# Patient Record
Sex: Male | Born: 1945
Health system: Southern US, Community
[De-identification: ages and names within clinical notes are randomized; demographics above are authoritative.]

## PROBLEM LIST (undated history)

## (undated) DIAGNOSIS — I10 Essential (primary) hypertension: Secondary | ICD-10-CM

## (undated) DIAGNOSIS — N189 Chronic kidney disease, unspecified: Secondary | ICD-10-CM

## (undated) DIAGNOSIS — H409 Unspecified glaucoma: Secondary | ICD-10-CM

## (undated) DIAGNOSIS — E039 Hypothyroidism, unspecified: Secondary | ICD-10-CM

## (undated) DIAGNOSIS — H269 Unspecified cataract: Secondary | ICD-10-CM

## (undated) DIAGNOSIS — G4730 Sleep apnea, unspecified: Secondary | ICD-10-CM

## (undated) DIAGNOSIS — R7303 Prediabetes: Secondary | ICD-10-CM

## (undated) DIAGNOSIS — T7840XA Allergy, unspecified, initial encounter: Secondary | ICD-10-CM

## (undated) DIAGNOSIS — M1990 Unspecified osteoarthritis, unspecified site: Secondary | ICD-10-CM

## (undated) DIAGNOSIS — E119 Type 2 diabetes mellitus without complications: Secondary | ICD-10-CM

## (undated) DIAGNOSIS — J45909 Unspecified asthma, uncomplicated: Secondary | ICD-10-CM

## (undated) DIAGNOSIS — Z973 Presence of spectacles and contact lenses: Secondary | ICD-10-CM

## (undated) DIAGNOSIS — K219 Gastro-esophageal reflux disease without esophagitis: Secondary | ICD-10-CM

## (undated) DIAGNOSIS — E785 Hyperlipidemia, unspecified: Secondary | ICD-10-CM

## (undated) DIAGNOSIS — D649 Anemia, unspecified: Secondary | ICD-10-CM

## (undated) DIAGNOSIS — J189 Pneumonia, unspecified organism: Secondary | ICD-10-CM

## (undated) HISTORY — DX: Essential (primary) hypertension: I10

## (undated) HISTORY — DX: Unspecified cataract: H26.9

## (undated) HISTORY — DX: Unspecified glaucoma: H40.9

## (undated) HISTORY — DX: Type 2 diabetes mellitus without complications: E11.9

## (undated) HISTORY — PX: JOINT REPLACEMENT: SHX530

## (undated) HISTORY — DX: Unspecified asthma, uncomplicated: J45.909

## (undated) HISTORY — DX: Hyperlipidemia, unspecified: E78.5

## (undated) HISTORY — DX: Gastro-esophageal reflux disease without esophagitis: K21.9

## (undated) HISTORY — PX: TONSILLECTOMY: SUR1361

## (undated) HISTORY — DX: Allergy, unspecified, initial encounter: T78.40XA

## (undated) HISTORY — PX: EYE SURGERY: SHX253

## (undated) HISTORY — PX: COLONOSCOPY: SHX174

## (undated) HISTORY — DX: Unspecified osteoarthritis, unspecified site: M19.90

## (undated) HISTORY — DX: Hypothyroidism, unspecified: E03.9

---

## 1972-11-28 HISTORY — PX: THYROIDECTOMY: SHX17

## 1999-01-08 ENCOUNTER — Ambulatory Visit: Admission: RE | Admit: 1999-01-08 | Discharge: 1999-01-08 | Payer: Self-pay | Admitting: Emergency Medicine

## 1999-11-10 ENCOUNTER — Other Ambulatory Visit: Admission: RE | Admit: 1999-11-10 | Discharge: 1999-11-10 | Payer: Self-pay | Admitting: Otolaryngology

## 1999-11-10 ENCOUNTER — Encounter (INDEPENDENT_AMBULATORY_CARE_PROVIDER_SITE_OTHER): Payer: Self-pay | Admitting: Specialist

## 2004-10-26 ENCOUNTER — Encounter: Admission: RE | Admit: 2004-10-26 | Discharge: 2004-10-26 | Payer: Self-pay | Admitting: Emergency Medicine

## 2004-11-28 HISTORY — PX: EYE SURGERY: SHX253

## 2004-12-15 ENCOUNTER — Encounter: Admission: RE | Admit: 2004-12-15 | Discharge: 2004-12-15 | Payer: Self-pay | Admitting: Emergency Medicine

## 2005-01-24 ENCOUNTER — Encounter: Admission: RE | Admit: 2005-01-24 | Discharge: 2005-01-24 | Payer: Self-pay | Admitting: Emergency Medicine

## 2005-01-26 ENCOUNTER — Encounter: Admission: RE | Admit: 2005-01-26 | Discharge: 2005-01-26 | Payer: Self-pay | Admitting: Emergency Medicine

## 2005-06-10 ENCOUNTER — Emergency Department (HOSPITAL_COMMUNITY): Admission: EM | Admit: 2005-06-10 | Discharge: 2005-06-10 | Payer: Self-pay | Admitting: Emergency Medicine

## 2006-06-19 ENCOUNTER — Encounter: Admission: RE | Admit: 2006-06-19 | Discharge: 2006-06-19 | Payer: Self-pay | Admitting: Emergency Medicine

## 2006-06-19 ENCOUNTER — Encounter (INDEPENDENT_AMBULATORY_CARE_PROVIDER_SITE_OTHER): Payer: Self-pay | Admitting: *Deleted

## 2006-06-19 IMAGING — RF DG ESOPHAGUS
13 of 16 series · 19 of 24 positions shown · non-contrast
Comparison: none

CLINICAL DATA: Dysphagia. 
 BARIUM SWALLOW:

[Series 1: run · 1 of 1 slices shown (1 of 13)]
[im 1/1]
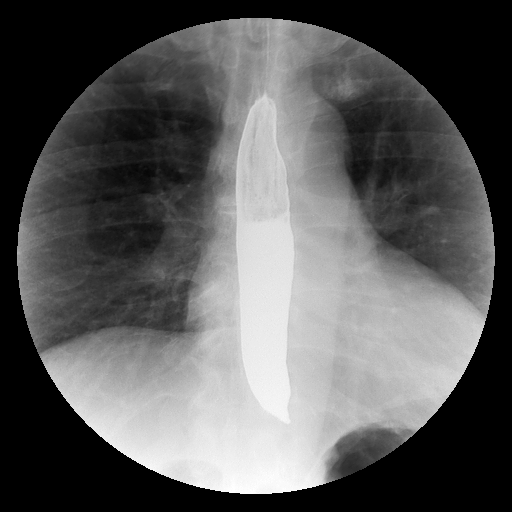

[Series 3: run · 1 of 1 slices shown (2 of 13)]
[im 1/1]
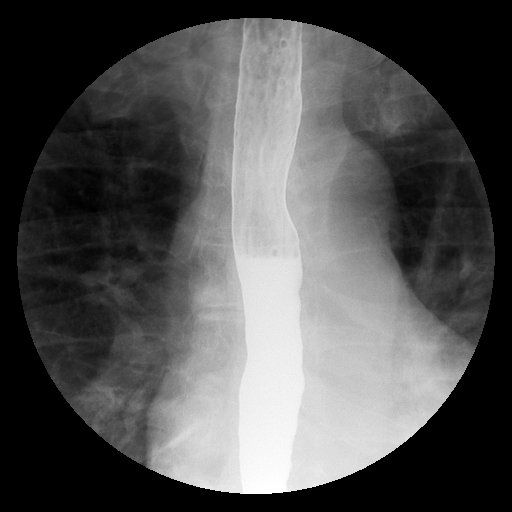

[Series 5: run · 1 of 1 slices shown (3 of 13)]
[im 1/1]
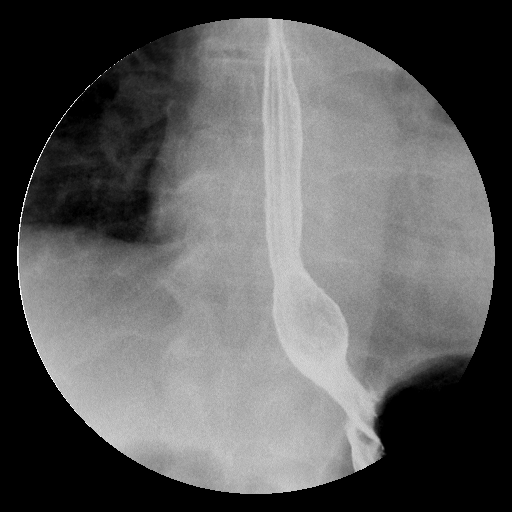

[Series 6: run · 1 of 1 slices shown (4 of 13)]
[im 1/1]
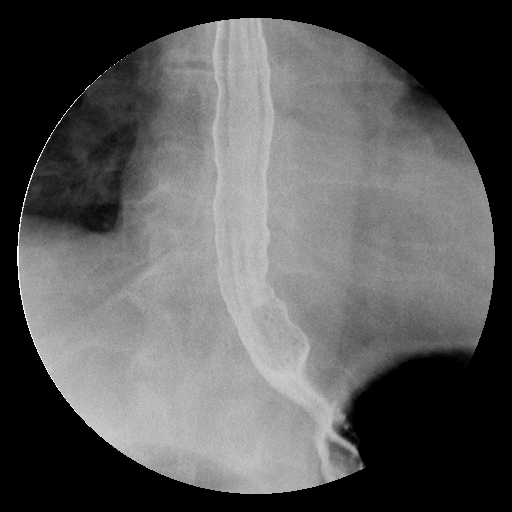

[Series 7: run · 5 of 11 slices shown (5 of 13)]
[im 1/11]
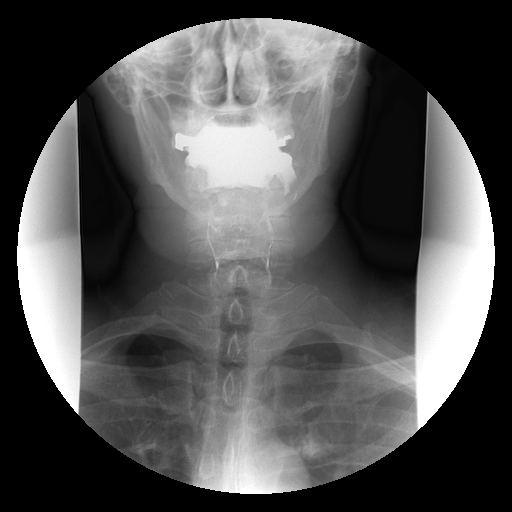
[im 3/11]
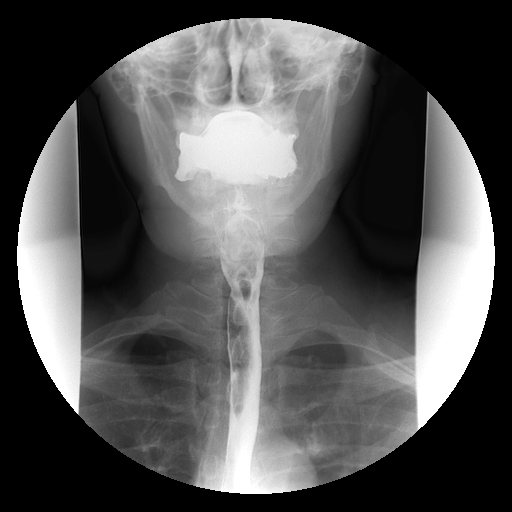
[im 7/11]
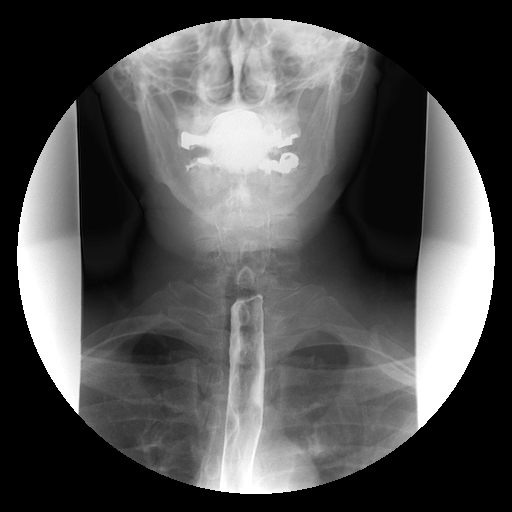
[im 9/11]
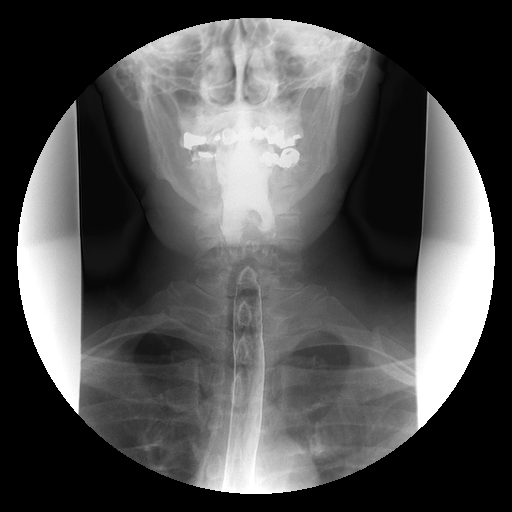
[im 11/11]
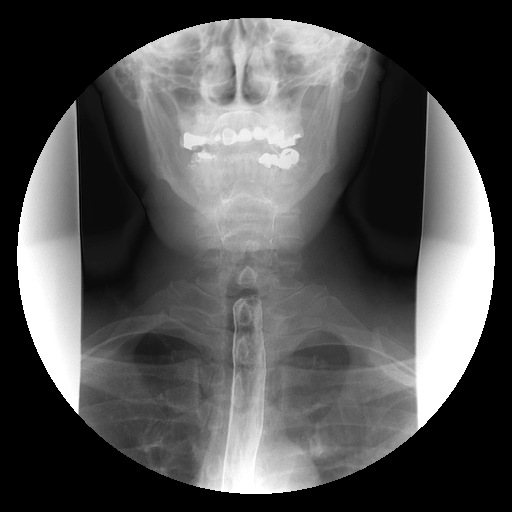

[Series 8: run · 3 of 7 slices shown (6 of 13)]
[im 3/7]
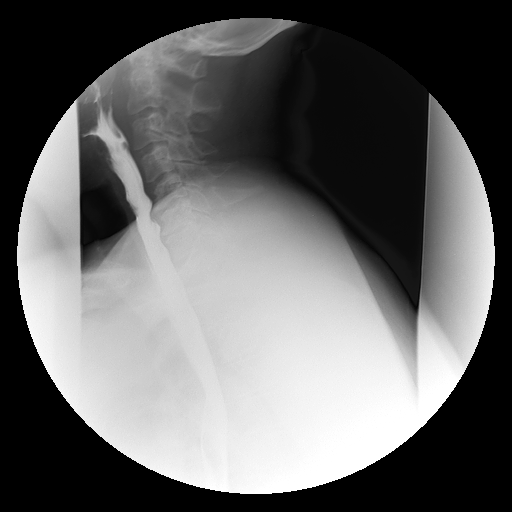
[im 5/7]
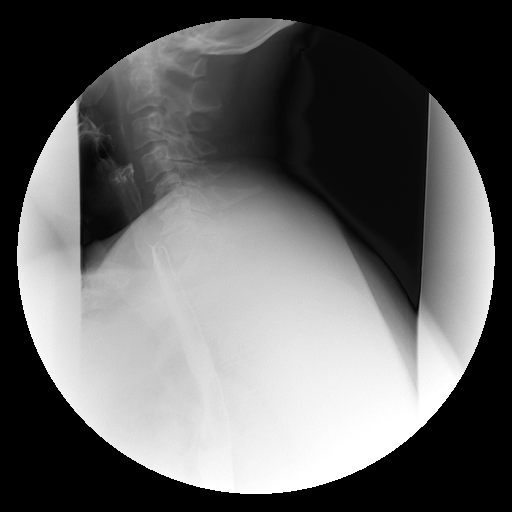
[im 7/7]
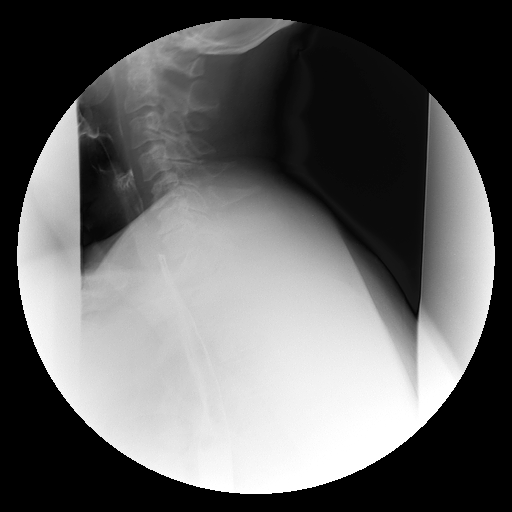

[Series 9: run · 1 of 1 slices shown (7 of 13)]
[im 1/1]
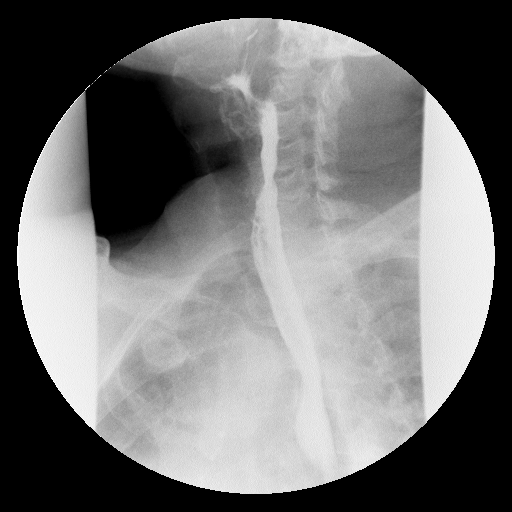

[Series 12: run · 1 of 1 slices shown (8 of 13)]
[im 1/1]
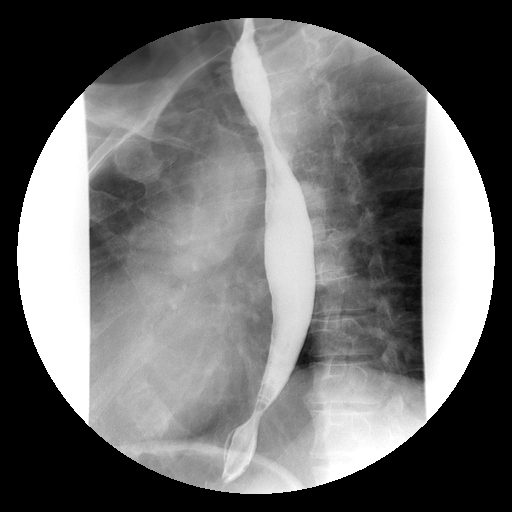

[Series 13: run · 1 of 1 slices shown (9 of 13)]
[im 1/1]
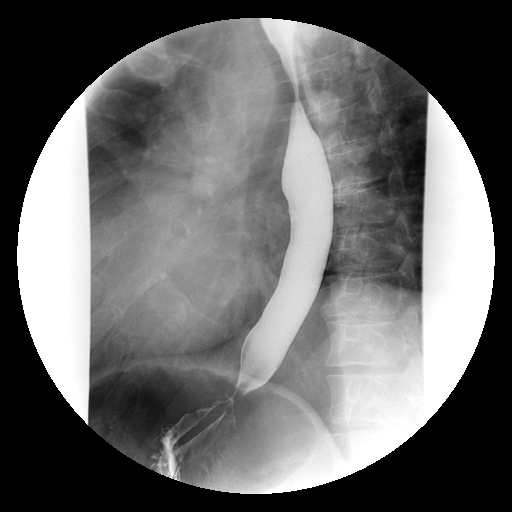

[Series 14: run · 1 of 1 slices shown (10 of 13)]
[im 1/1]
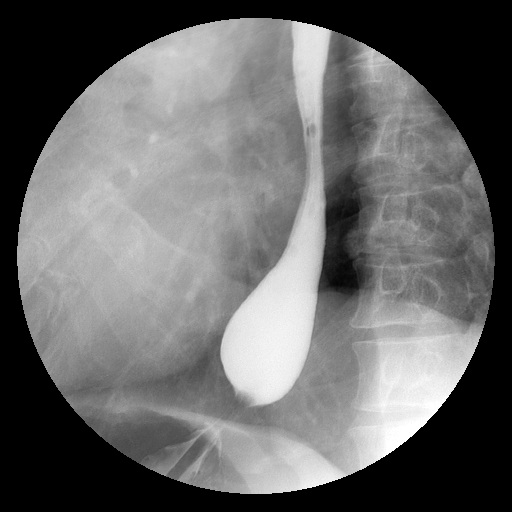

[Series 15: run · 1 of 1 slices shown (11 of 13)]
[im 1/1]
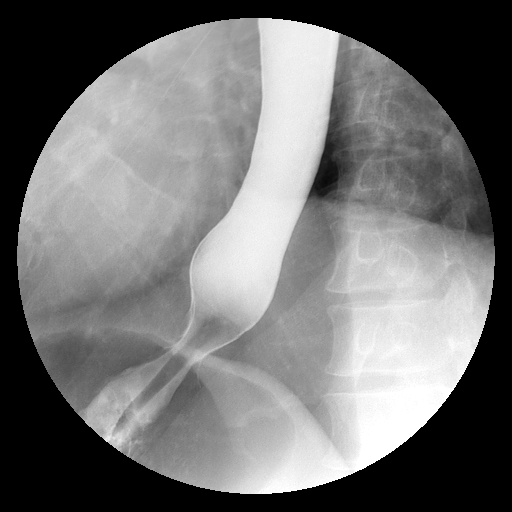

[Series 17: run · 1 of 1 slices shown (12 of 13)]
[im 1/1]
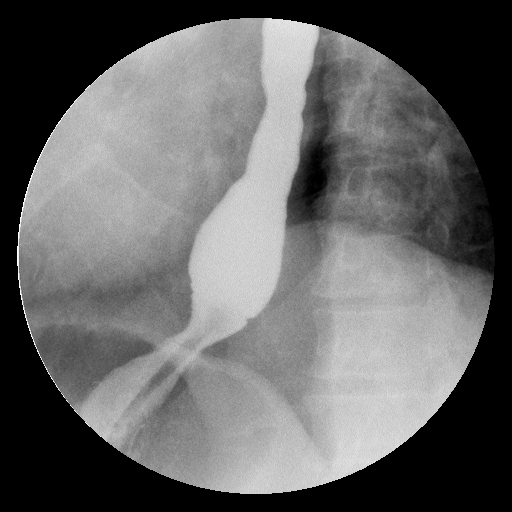

[Series 18: run · 1 of 1 slices shown (13 of 13)]
[im 1/1]
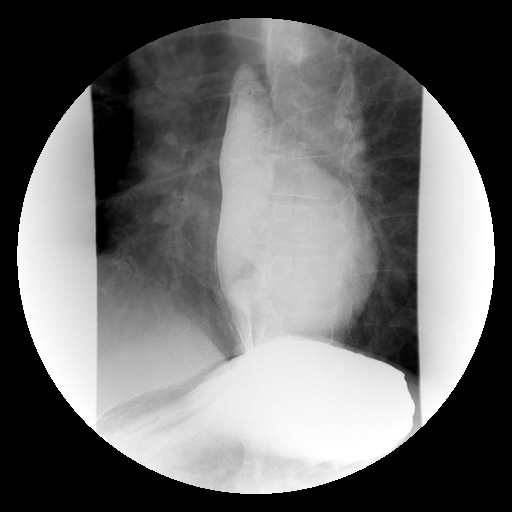

[19 of 24 positions shown; findings below may reference images not displayed]

FINDINGS: A double contrast barium swallow was performed.  The mucosa of the esophagus appears normal.  This single contrast study was performed with rapid sequence spot films of the cervical esophagus.  The swallowing mechanism appears normal with only slight indentation by osteophytes at C5-6 and the C6-7 levels. Peristalsis is normal.  There is a small sliding hiatal hernia and faint gastroesophageal reflux is noted fluoroscopically.  A barium pill was given at the end of the study which passed into the stomach without delay.
IMPRESSION: 1.  Small sliding hiatal hernia with faint reflux.  A barium pill passes into the stomach without delay.
 2.  Mild indentation on posterior cervical esophagus by osteophytes at C5-6 and C6-7.

## 2007-02-12 ENCOUNTER — Ambulatory Visit (HOSPITAL_COMMUNITY): Admission: RE | Admit: 2007-02-12 | Discharge: 2007-02-12 | Payer: Self-pay | Admitting: Gastroenterology

## 2007-02-12 ENCOUNTER — Encounter (INDEPENDENT_AMBULATORY_CARE_PROVIDER_SITE_OTHER): Payer: Self-pay | Admitting: Specialist

## 2007-06-29 HISTORY — PX: NM MYOVIEW LTD: HXRAD82

## 2007-07-11 LAB — HM COLONOSCOPY: HM Colonoscopy: NORMAL

## 2007-10-19 ENCOUNTER — Inpatient Hospital Stay (HOSPITAL_COMMUNITY): Admission: RE | Admit: 2007-10-19 | Discharge: 2007-10-24 | Payer: Self-pay | Admitting: Orthopedic Surgery

## 2007-10-26 ENCOUNTER — Emergency Department (HOSPITAL_COMMUNITY): Admission: EM | Admit: 2007-10-26 | Discharge: 2007-10-26 | Payer: Self-pay | Admitting: Emergency Medicine

## 2007-11-29 HISTORY — PX: TOTAL KNEE ARTHROPLASTY: SHX125

## 2009-05-28 ENCOUNTER — Encounter (INDEPENDENT_AMBULATORY_CARE_PROVIDER_SITE_OTHER): Payer: Self-pay | Admitting: *Deleted

## 2009-05-28 ENCOUNTER — Ambulatory Visit: Payer: Self-pay | Admitting: Internal Medicine

## 2009-05-28 DIAGNOSIS — K219 Gastro-esophageal reflux disease without esophagitis: Secondary | ICD-10-CM | POA: Insufficient documentation

## 2009-05-28 DIAGNOSIS — M1711 Unilateral primary osteoarthritis, right knee: Secondary | ICD-10-CM | POA: Insufficient documentation

## 2009-05-28 DIAGNOSIS — I10 Essential (primary) hypertension: Secondary | ICD-10-CM | POA: Insufficient documentation

## 2009-05-28 DIAGNOSIS — E039 Hypothyroidism, unspecified: Secondary | ICD-10-CM | POA: Insufficient documentation

## 2009-05-28 DIAGNOSIS — R351 Nocturia: Secondary | ICD-10-CM

## 2009-05-28 DIAGNOSIS — E785 Hyperlipidemia, unspecified: Secondary | ICD-10-CM | POA: Insufficient documentation

## 2009-05-28 DIAGNOSIS — R9431 Abnormal electrocardiogram [ECG] [EKG]: Secondary | ICD-10-CM | POA: Insufficient documentation

## 2009-05-28 DIAGNOSIS — E1169 Type 2 diabetes mellitus with other specified complication: Secondary | ICD-10-CM | POA: Insufficient documentation

## 2009-05-28 DIAGNOSIS — N401 Enlarged prostate with lower urinary tract symptoms: Secondary | ICD-10-CM | POA: Insufficient documentation

## 2009-05-29 ENCOUNTER — Encounter: Payer: Self-pay | Admitting: Internal Medicine

## 2009-06-18 ENCOUNTER — Telehealth: Payer: Self-pay | Admitting: Gastroenterology

## 2009-06-29 ENCOUNTER — Ambulatory Visit: Payer: Self-pay | Admitting: Internal Medicine

## 2009-06-29 DIAGNOSIS — F528 Other sexual dysfunction not due to a substance or known physiological condition: Secondary | ICD-10-CM | POA: Insufficient documentation

## 2009-07-14 ENCOUNTER — Encounter: Payer: Self-pay | Admitting: Internal Medicine

## 2009-07-24 ENCOUNTER — Encounter: Payer: Self-pay | Admitting: Internal Medicine

## 2009-08-06 ENCOUNTER — Encounter: Payer: Self-pay | Admitting: Internal Medicine

## 2009-08-19 ENCOUNTER — Ambulatory Visit: Payer: Self-pay | Admitting: Internal Medicine

## 2009-08-19 LAB — CONVERTED CEMR LAB
Bilirubin Urine: NEGATIVE
Chlamydia, Swab/Urine, PCR: NEGATIVE
GC Probe Amp, Urine: NEGATIVE
Ketones, ur: NEGATIVE mg/dL
Leukocytes, UA: NEGATIVE
Nitrite: NEGATIVE
Specific Gravity, Urine: 1.015 (ref 1.000–1.030)
Total Protein, Urine: NEGATIVE mg/dL
Urine Glucose: NEGATIVE mg/dL
Urobilinogen, UA: 0.2 (ref 0.0–1.0)
pH: 6.5 (ref 5.0–8.0)

## 2009-10-14 ENCOUNTER — Ambulatory Visit: Payer: Self-pay | Admitting: Internal Medicine

## 2009-10-14 DIAGNOSIS — L219 Seborrheic dermatitis, unspecified: Secondary | ICD-10-CM | POA: Insufficient documentation

## 2009-10-14 LAB — CONVERTED CEMR LAB
ALT: 17 units/L (ref 0–53)
AST: 19 units/L (ref 0–37)
Albumin: 4.3 g/dL (ref 3.5–5.2)
Alkaline Phosphatase: 78 units/L (ref 39–117)
BUN: 11 mg/dL (ref 6–23)
Basophils Absolute: 0 10*3/uL (ref 0.0–0.1)
Basophils Relative: 0.4 % (ref 0.0–3.0)
Bilirubin Urine: NEGATIVE
Bilirubin, Direct: 0.1 mg/dL (ref 0.0–0.3)
CO2: 29 meq/L (ref 19–32)
Calcium: 9.3 mg/dL (ref 8.4–10.5)
Chloride: 105 meq/L (ref 96–112)
Creatinine, Ser: 1.2 mg/dL (ref 0.4–1.5)
Eosinophils Absolute: 0.4 10*3/uL (ref 0.0–0.7)
Eosinophils Relative: 5.3 % — ABNORMAL HIGH (ref 0.0–5.0)
GFR calc non Af Amer: 78.42 mL/min (ref >60)
Glucose, Bld: 99 mg/dL (ref 70–99)
HCT: 40.5 % (ref 39.0–52.0)
Hemoglobin, Urine: NEGATIVE
Hemoglobin: 13.4 g/dL (ref 13.0–17.0)
Ketones, ur: NEGATIVE mg/dL
Leukocytes, UA: NEGATIVE
Lipase: 21 units/L (ref 11.0–59.0)
Lymphocytes Relative: 16.8 % (ref 12.0–46.0)
Lymphs Abs: 1.2 10*3/uL (ref 0.7–4.0)
MCHC: 33.1 g/dL (ref 30.0–36.0)
MCV: 92.9 fL (ref 78.0–100.0)
Monocytes Absolute: 0.6 10*3/uL (ref 0.1–1.0)
Monocytes Relative: 8.6 % (ref 3.0–12.0)
Neutro Abs: 5.1 10*3/uL (ref 1.4–7.7)
Neutrophils Relative %: 68.9 % (ref 43.0–77.0)
Nitrite: NEGATIVE
Platelets: 213 10*3/uL (ref 150.0–400.0)
Potassium: 3.6 meq/L (ref 3.5–5.1)
RBC: 4.36 M/uL (ref 4.22–5.81)
RDW: 13.4 % (ref 11.5–14.6)
Sodium: 141 meq/L (ref 135–145)
Specific Gravity, Urine: 1.01 (ref 1.000–1.030)
TSH: 0.61 microintl units/mL (ref 0.35–5.50)
Total Bilirubin: 0.8 mg/dL (ref 0.3–1.2)
Total Protein, Urine: NEGATIVE mg/dL
Total Protein: 7.8 g/dL (ref 6.0–8.3)
Urine Glucose: NEGATIVE mg/dL
Urobilinogen, UA: 0.2 (ref 0.0–1.0)
WBC: 7.3 10*3/uL (ref 4.5–10.5)
pH: 7 (ref 5.0–8.0)

## 2009-10-19 ENCOUNTER — Ambulatory Visit: Payer: Self-pay | Admitting: Cardiovascular Disease

## 2009-10-28 ENCOUNTER — Telehealth (INDEPENDENT_AMBULATORY_CARE_PROVIDER_SITE_OTHER): Payer: Self-pay | Admitting: *Deleted

## 2009-11-10 ENCOUNTER — Ambulatory Visit: Payer: Self-pay | Admitting: Internal Medicine

## 2009-11-10 LAB — CONVERTED CEMR LAB
Bilirubin Urine: NEGATIVE
Glucose, Urine, Semiquant: NEGATIVE
Ketones, urine, test strip: NEGATIVE
Nitrite: NEGATIVE
Protein, U semiquant: NEGATIVE
Specific Gravity, Urine: 1.01
Urobilinogen, UA: 0.2
pH: 6

## 2009-12-10 ENCOUNTER — Ambulatory Visit: Payer: Self-pay | Admitting: Internal Medicine

## 2009-12-10 LAB — CONVERTED CEMR LAB
Bilirubin Urine: NEGATIVE
Blood in Urine, dipstick: NEGATIVE
Glucose, Urine, Semiquant: NEGATIVE
Ketones, urine, test strip: NEGATIVE
Nitrite: NEGATIVE
Specific Gravity, Urine: 1.02
Urobilinogen, UA: 0.2
WBC Urine, dipstick: NEGATIVE
pH: 6.5

## 2010-01-15 ENCOUNTER — Telehealth: Payer: Self-pay | Admitting: Internal Medicine

## 2010-04-14 ENCOUNTER — Ambulatory Visit: Payer: Self-pay | Admitting: Internal Medicine

## 2010-04-28 ENCOUNTER — Ambulatory Visit: Payer: Self-pay | Admitting: Internal Medicine

## 2010-04-30 ENCOUNTER — Ambulatory Visit: Payer: Self-pay | Admitting: Internal Medicine

## 2010-04-30 LAB — CONVERTED CEMR LAB
Cholesterol, target level: 200 mg/dL
HDL goal, serum: 40 mg/dL
LDL Goal: 130 mg/dL

## 2010-05-12 ENCOUNTER — Telehealth: Payer: Self-pay | Admitting: Internal Medicine

## 2010-05-13 ENCOUNTER — Ambulatory Visit: Payer: Self-pay | Admitting: Internal Medicine

## 2010-07-27 ENCOUNTER — Encounter: Payer: Self-pay | Admitting: Internal Medicine

## 2010-07-28 ENCOUNTER — Telehealth: Payer: Self-pay | Admitting: Internal Medicine

## 2010-09-20 ENCOUNTER — Telehealth: Payer: Self-pay | Admitting: Internal Medicine

## 2010-09-30 ENCOUNTER — Encounter: Payer: Self-pay | Admitting: Internal Medicine

## 2010-09-30 ENCOUNTER — Ambulatory Visit: Payer: Self-pay | Admitting: Internal Medicine

## 2010-09-30 DIAGNOSIS — R079 Chest pain, unspecified: Secondary | ICD-10-CM | POA: Insufficient documentation

## 2010-09-30 LAB — CONVERTED CEMR LAB
ALT: 14 units/L (ref 0–53)
AST: 17 units/L (ref 0–37)
Albumin: 4 g/dL (ref 3.5–5.2)
Alkaline Phosphatase: 60 units/L (ref 39–117)
BUN: 16 mg/dL (ref 6–23)
Basophils Absolute: 0 10*3/uL (ref 0.0–0.1)
Basophils Relative: 0.4 % (ref 0.0–3.0)
Bilirubin, Direct: 0.1 mg/dL (ref 0.0–0.3)
CK-MB: 2.1 ng/mL (ref 0.3–4.0)
CO2: 31 meq/L (ref 19–32)
Calcium: 9.4 mg/dL (ref 8.4–10.5)
Chloride: 102 meq/L (ref 96–112)
Creatinine, Ser: 1.3 mg/dL (ref 0.4–1.5)
Eosinophils Absolute: 0.3 10*3/uL (ref 0.0–0.7)
Eosinophils Relative: 3.9 % (ref 0.0–5.0)
GFR calc non Af Amer: 70.04 mL/min (ref >60)
Glucose, Bld: 95 mg/dL (ref 70–99)
HCT: 39.2 % (ref 39.0–52.0)
Hemoglobin: 13.3 g/dL (ref 13.0–17.0)
Lymphocytes Relative: 14.6 % (ref 12.0–46.0)
Lymphs Abs: 1.3 10*3/uL (ref 0.7–4.0)
MCHC: 33.8 g/dL (ref 30.0–36.0)
MCV: 92.3 fL (ref 78.0–100.0)
Monocytes Absolute: 0.8 10*3/uL (ref 0.1–1.0)
Monocytes Relative: 9.2 % (ref 3.0–12.0)
Neutro Abs: 6.3 10*3/uL (ref 1.4–7.7)
Neutrophils Relative %: 71.9 % (ref 43.0–77.0)
Platelets: 265 10*3/uL (ref 150.0–400.0)
Potassium: 4 meq/L (ref 3.5–5.1)
RBC: 4.25 M/uL (ref 4.22–5.81)
RDW: 14.2 % (ref 11.5–14.6)
Relative Index: 0.9 (ref 0.0–2.5)
Sodium: 140 meq/L (ref 135–145)
TSH: 2.47 microintl units/mL (ref 0.35–5.50)
Total Bilirubin: 0.7 mg/dL (ref 0.3–1.2)
Total CK: 233 units/L — ABNORMAL HIGH (ref 7–232)
Total Protein: 7.4 g/dL (ref 6.0–8.3)
WBC: 8.7 10*3/uL (ref 4.5–10.5)

## 2010-10-01 ENCOUNTER — Ambulatory Visit: Payer: Self-pay | Admitting: Internal Medicine

## 2010-10-01 DIAGNOSIS — I498 Other specified cardiac arrhythmias: Secondary | ICD-10-CM | POA: Insufficient documentation

## 2010-10-12 ENCOUNTER — Telehealth (INDEPENDENT_AMBULATORY_CARE_PROVIDER_SITE_OTHER): Payer: Self-pay | Admitting: *Deleted

## 2010-10-13 ENCOUNTER — Encounter: Payer: Self-pay | Admitting: Cardiovascular Disease

## 2010-10-13 ENCOUNTER — Encounter (HOSPITAL_COMMUNITY)
Admission: RE | Admit: 2010-10-13 | Discharge: 2010-11-27 | Payer: Self-pay | Source: Home / Self Care | Attending: Internal Medicine | Admitting: Internal Medicine

## 2010-10-13 ENCOUNTER — Ambulatory Visit: Payer: Self-pay | Admitting: Cardiovascular Disease

## 2010-10-13 ENCOUNTER — Ambulatory Visit: Payer: Self-pay

## 2010-10-18 ENCOUNTER — Telehealth: Payer: Self-pay | Admitting: Internal Medicine

## 2010-10-18 DIAGNOSIS — I517 Cardiomegaly: Secondary | ICD-10-CM | POA: Insufficient documentation

## 2010-10-19 ENCOUNTER — Ambulatory Visit: Payer: Self-pay | Admitting: Cardiology

## 2010-10-19 ENCOUNTER — Ambulatory Visit (HOSPITAL_COMMUNITY): Admission: RE | Admit: 2010-10-19 | Discharge: 2010-10-19 | Payer: Self-pay | Admitting: Internal Medicine

## 2010-10-19 ENCOUNTER — Encounter: Payer: Self-pay | Admitting: Internal Medicine

## 2010-10-19 ENCOUNTER — Ambulatory Visit: Payer: Self-pay

## 2010-10-26 ENCOUNTER — Telehealth: Payer: Self-pay | Admitting: Internal Medicine

## 2010-11-19 ENCOUNTER — Ambulatory Visit: Payer: Self-pay | Admitting: Internal Medicine

## 2010-11-19 DIAGNOSIS — J019 Acute sinusitis, unspecified: Secondary | ICD-10-CM | POA: Insufficient documentation

## 2010-12-19 ENCOUNTER — Encounter: Payer: Self-pay | Admitting: Emergency Medicine

## 2010-12-26 LAB — CONVERTED CEMR LAB
ALT: 17 units/L (ref 0–53)
ALT: 19 units/L (ref 0–53)
AST: 20 units/L (ref 0–37)
AST: 21 units/L (ref 0–37)
Albumin: 4.1 g/dL (ref 3.5–5.2)
Albumin: 4.3 g/dL (ref 3.5–5.2)
Alkaline Phosphatase: 77 units/L (ref 39–117)
Alkaline Phosphatase: 81 units/L (ref 39–117)
Anti Nuclear Antibody(ANA): NEGATIVE
BUN: 12 mg/dL (ref 6–23)
BUN: 14 mg/dL (ref 6–23)
Basophils Absolute: 0 10*3/uL (ref 0.0–0.1)
Basophils Absolute: 0 10*3/uL (ref 0.0–0.1)
Basophils Relative: 0.3 % (ref 0.0–3.0)
Basophils Relative: 0.5 % (ref 0.0–3.0)
Bilirubin Urine: NEGATIVE
Bilirubin Urine: NEGATIVE
Bilirubin, Direct: 0.1 mg/dL (ref 0.0–0.3)
Bilirubin, Direct: 0.1 mg/dL (ref 0.0–0.3)
CO2: 31 meq/L (ref 19–32)
CO2: 32 meq/L (ref 19–32)
CRP, High Sensitivity: 3.63 (ref 0.00–5.00)
Calcium: 9.4 mg/dL (ref 8.4–10.5)
Calcium: 9.8 mg/dL (ref 8.4–10.5)
Chloride: 100 meq/L (ref 96–112)
Chloride: 102 meq/L (ref 96–112)
Cholesterol: 231 mg/dL — ABNORMAL HIGH (ref 0–200)
Creatinine, Ser: 1.2 mg/dL (ref 0.4–1.5)
Creatinine, Ser: 1.2 mg/dL (ref 0.4–1.5)
Direct LDL: 139.2 mg/dL
Eosinophils Absolute: 0.1 10*3/uL (ref 0.0–0.7)
Eosinophils Absolute: 0.3 10*3/uL (ref 0.0–0.7)
Eosinophils Relative: 1.6 % (ref 0.0–5.0)
Eosinophils Relative: 3.5 % (ref 0.0–5.0)
GFR calc non Af Amer: 78.52 mL/min (ref >60)
GFR calc non Af Amer: 81.42 mL/min (ref >60)
Glucose, Bld: 102 mg/dL — ABNORMAL HIGH (ref 70–99)
Glucose, Bld: 94 mg/dL (ref 70–99)
HCT: 41.6 % (ref 39.0–52.0)
HCT: 41.6 % (ref 39.0–52.0)
HDL: 62.2 mg/dL (ref >39.00)
Hemoglobin, Urine: NEGATIVE
Hemoglobin, Urine: NEGATIVE
Hemoglobin: 14.1 g/dL (ref 13.0–17.0)
Hemoglobin: 14.2 g/dL (ref 13.0–17.0)
Ketones, ur: NEGATIVE mg/dL
Ketones, ur: NEGATIVE mg/dL
Leukocytes, UA: NEGATIVE
Leukocytes, UA: NEGATIVE
Lymphocytes Relative: 14.2 % (ref 12.0–46.0)
Lymphocytes Relative: 17.6 % (ref 12.0–46.0)
Lymphs Abs: 1.4 10*3/uL (ref 0.7–4.0)
Lymphs Abs: 1.4 10*3/uL (ref 0.7–4.0)
MCHC: 34 g/dL (ref 30.0–36.0)
MCHC: 34.2 g/dL (ref 30.0–36.0)
MCV: 90.9 fL (ref 78.0–100.0)
MCV: 91.2 fL (ref 78.0–100.0)
Monocytes Absolute: 0.7 10*3/uL (ref 0.1–1.0)
Monocytes Absolute: 0.7 10*3/uL (ref 0.1–1.0)
Monocytes Relative: 6.9 % (ref 3.0–12.0)
Monocytes Relative: 8.7 % (ref 3.0–12.0)
Neutro Abs: 5.6 10*3/uL (ref 1.4–7.7)
Neutro Abs: 7.4 10*3/uL (ref 1.4–7.7)
Neutrophils Relative %: 69.9 % (ref 43.0–77.0)
Neutrophils Relative %: 76.8 % (ref 43.0–77.0)
Nitrite: NEGATIVE
Nitrite: NEGATIVE
PSA: 1.71 ng/mL (ref 0.10–4.00)
Platelets: 243 10*3/uL (ref 150.0–400.0)
Platelets: 264 10*3/uL (ref 150.0–400.0)
Potassium: 3.8 meq/L (ref 3.5–5.1)
Potassium: 3.9 meq/L (ref 3.5–5.1)
RBC: 4.56 M/uL (ref 4.22–5.81)
RBC: 4.58 M/uL (ref 4.22–5.81)
RDW: 12.8 % (ref 11.5–14.6)
RDW: 14.4 % (ref 11.5–14.6)
Rheumatoid fact SerPl-aCnc: 20 intl units/mL (ref 0.0–20.0)
Sed Rate: 11 mm/hr (ref 0–22)
Sodium: 140 meq/L (ref 135–145)
Sodium: 141 meq/L (ref 135–145)
Specific Gravity, Urine: 1.02 (ref 1.000–1.030)
Specific Gravity, Urine: 1.02 (ref 1.000–1.030)
TSH: 0.98 microintl units/mL (ref 0.35–5.50)
TSH: 1.36 microintl units/mL (ref 0.35–5.50)
Total Bilirubin: 0.5 mg/dL (ref 0.3–1.2)
Total Bilirubin: 1 mg/dL (ref 0.3–1.2)
Total CHOL/HDL Ratio: 4
Total CK: 392 units/L — ABNORMAL HIGH (ref 7–232)
Total Protein, Urine: 30 mg/dL
Total Protein, Urine: 30 mg/dL
Total Protein: 7.9 g/dL (ref 6.0–8.3)
Total Protein: 7.9 g/dL (ref 6.0–8.3)
Triglycerides: 85 mg/dL (ref 0.0–149.0)
Urine Glucose: NEGATIVE mg/dL
Urine Glucose: NEGATIVE mg/dL
Urobilinogen, UA: 0.2 (ref 0.0–1.0)
Urobilinogen, UA: 0.2 (ref 0.0–1.0)
VLDL: 17 mg/dL (ref 0.0–40.0)
WBC: 8 10*3/uL (ref 4.5–10.5)
WBC: 9.6 10*3/uL (ref 4.5–10.5)
pH: 7 (ref 5.0–8.0)
pH: 7 (ref 5.0–8.0)

## 2010-12-30 NOTE — Assessment & Plan Note (Signed)
Summary: h/a and elevated bp / SD   Vital Signs:  Patient profile:   65 year old male Height:      69 inches Weight:      207 pounds BMI:     30.68 O2 Sat:      97 % on Room air Temp:     98.9 degrees F oral Pulse rate:   55 / minute Pulse rhythm:   regular Resp:     16 per minute BP sitting:   148 / 82  (left arm) Cuff size:   large  Vitals Entered By: Rock Nephew CMA (May 13, 2010 3:48 PM)  Nutrition Counseling: Patient's BMI is greater than 25 and therefore counseled on weight management options.  O2 Flow:  Room air CC: headache with elevated bp Is Patient Diabetic? No Pain Assessment Patient in pain? no        Primary Care Provider:  [name] MD  CC:  headache with elevated bp.  History of Present Illness:  Hypertension Follow-Up      This is a 65 year old man who presents for Hypertension follow-up.  The patient reports headaches, but denies lightheadedness, urinary frequency, edema, impotence, rash, and fatigue.  The patient denies the following associated symptoms: chest pain, chest pressure, exercise intolerance, dyspnea, palpitations, syncope, leg edema, and pedal edema.  Compliance with medications (by patient report) has been near 100%.  The patient reports that dietary compliance has been good.  The patient reports exercising 3-4X per week.  Adjunctive measures currently used by the patient include salt restriction and relaxation.  He says BP at home is usually close to 180/90 with a cuff that has been calibrated and he says is "close to normal" but may be a little high.  Preventive Screening-Counseling & Management  Alcohol-Tobacco     Alcohol drinks/day: 0     Smoking Status: never  Hep-HIV-STD-Contraception     Hepatitis Risk: no risk noted     HIV Risk: no risk noted     STD Risk: no risk noted      Sexual History:  currently monogamous.        Drug Use:  no.        Blood Transfusions:  no.    Clinical Review Panels:  Prevention  Last Colonoscopy:  Normal (07/11/2007)   Last PSA:  1.71 (05/28/2009)  Immunizations   Last Tetanus Booster:  Historical (11/28/2004)   Last Flu Vaccine:  Fluvax MCR (08/19/2009)   Last Pneumovax:  Historical (11/28/2004)  Lipid Management   Cholesterol:  231 (05/28/2009)   HDL (good cholesterol):  62.20 (05/28/2009)  Diabetes Management   Creatinine:  1.2 (04/14/2010)   Last Flu Vaccine:  Fluvax MCR (08/19/2009)   Last Pneumovax:  Historical (11/28/2004)  CBC   WBC:  9.6 (04/14/2010)   RBC:  4.58 (04/14/2010)   Hgb:  14.1 (04/14/2010)   Hct:  41.6 (04/14/2010)   Platelets:  264.0 (04/14/2010)   MCV  90.9 (04/14/2010)   MCHC  34.0 (04/14/2010)   RDW  14.4 (04/14/2010)   PMN:  76.8 (04/14/2010)   Lymphs:  14.2 (04/14/2010)   Monos:  6.9 (04/14/2010)   Eosinophils:  1.6 (04/14/2010)   Basophil:  0.5 (04/14/2010)  Complete Metabolic Panel   Glucose:  94 (04/14/2010)   Sodium:  141 (04/14/2010)   Potassium:  3.9 (04/14/2010)   Chloride:  100 (04/14/2010)   CO2:  31 (04/14/2010)   BUN:  14 (04/14/2010)   Creatinine:  1.2 (  04/14/2010)   Albumin:  4.3 (04/14/2010)   Total Protein:  7.9 (04/14/2010)   Calcium:  9.8 (04/14/2010)   Total Bili:  0.5 (04/14/2010)   Alk Phos:  81 (04/14/2010)   SGPT (ALT):  19 (04/14/2010)   SGOT (AST):  21 (04/14/2010)   Medications Prior to Update: 1)  Levothyroxine Sodium 175 Mcg Tabs (Levothyroxine Sodium) .... Take 1 Tablet By Mouth Once A Day 2)  Aspirin 81 Mg Tbec (Aspirin) 3)  Pravastatin Sodium 40 Mg Tabs (Pravastatin Sodium) .... Take 1 Tab By Mouth At Bedtime 4)  Prevacid 15 Mg Cpdr (Lansoprazole) 5)  Elocon 0.1 % Crea (Mometasone Furoate) .... Apply To Rash On Chest Two Times A Day As Needed 6)  Align  Caps (Probiotic Product) 7)  Avodart 0.5 Mg Caps (Dutasteride) .... One Daily 8)  Flomax 0.4 Mg Caps (Tamsulosin Hcl) .... One Daily 9)  Valturna 300-320 Mg Tabs (Aliskiren-Valsartan) .... One By Mouth Once Daily For High Blood  Pressure  Current Medications (verified): 1)  Levothyroxine Sodium 175 Mcg Tabs (Levothyroxine Sodium) .... Take 1 Tablet By Mouth Once A Day 2)  Aspirin 81 Mg Tbec (Aspirin) 3)  Pravastatin Sodium 40 Mg Tabs (Pravastatin Sodium) .... Take 1 Tab By Mouth At Bedtime 4)  Prevacid 15 Mg Cpdr (Lansoprazole) 5)  Elocon 0.1 % Crea (Mometasone Furoate) .... Apply To Rash On Chest Two Times A Day As Needed 6)  Align  Caps (Probiotic Product) 7)  Avodart 0.5 Mg Caps (Dutasteride) .... One Daily 8)  Flomax 0.4 Mg Caps (Tamsulosin Hcl) .... One Daily 9)  Exforge Hct 5-160-12.5 Mg Tabs (Amlodipine-Valsartan-Hctz) .... One By Mouth Once Daily For High Blood Pressure  Allergies (verified): 1)  ! * Lisinopril  Past History:  Past Medical History: Last updated: 05/28/2009 GERD Hyperlipidemia Hypertension Hypothyroidism Osteoarthritis  Past Surgical History: Last updated: 05/28/2009 Total knee replacement Thyroidectomy Tonsillectomy  Family History: Last updated: 05/28/2009 Family History of CAD Male 1st degree relative <50  Social History: Last updated: 05/28/2009 Retired Married Never Smoked Alcohol use-no Drug use-no Regular exercise-yes  Risk Factors: Alcohol Use: 0 (05/13/2010) Exercise: yes (11/10/2009)  Risk Factors: Smoking Status: never (05/13/2010)  Family History: Reviewed history from 05/28/2009 and no changes required. Family History of CAD Male 1st degree relative <50  Social History: Reviewed history from 05/28/2009 and no changes required. Retired Married Never Smoked Alcohol use-no Drug use-no Regular exercise-yes  Review of Systems  The patient denies peripheral edema, abdominal pain, and hematuria.    Physical Exam  General:  alert, well-developed, well-nourished, well-hydrated, normal appearance, healthy-appearing, cooperative to examination, and good hygiene.  overweight-appearing.   Mouth:  Oral mucosa and oropharynx without lesions or  exudates.  Teeth in good repair. Neck:  supple, full ROM, no masses, no thyromegaly, no thyroid nodules or tenderness, no JVD, normal carotid upstroke, no carotid bruits, no cervical lymphadenopathy, and no neck tenderness.   Lungs:  normal respiratory effort, no intercostal retractions, no accessory muscle use, normal breath sounds, no dullness, no fremitus, no crackles, and no wheezes.   Heart:  Normal rate and regular rhythm. S1 and S2 normal without gallop, murmur, click, rub or other extra sounds. Abdomen:  soft, non-tender, normal bowel sounds, no distention, no masses, no guarding, no rigidity, no rebound tenderness, no abdominal hernia, no inguinal hernia, no hepatomegaly, and no splenomegaly.   Msk:  normal ROM, no joint swelling, no joint warmth, no redness over joints, no joint deformities, no joint instability, no crepitation, no muscle atrophy,  and joint tenderness over the MCP joints bilaterally. Pulses:  R and L carotid,radial,femoral,dorsalis pedis and posterior tibial pulses are full and equal bilaterally Extremities:  No clubbing, cyanosis, edema, or deformity noted with normal full range of motion of all joints.   Neurologic:  No cranial nerve deficits noted. Station and gait are normal. Plantar reflexes are down-going bilaterally. DTRs are symmetrical throughout. Sensory, motor and coordinative functions appear intact. Skin:  turgor normal, color normal, no rashes, no suspicious lesions, no ecchymoses, no petechiae, no purpura, no ulcerations, and no edema.   Cervical Nodes:  no anterior cervical adenopathy and no posterior cervical adenopathy.   Psych:  Cognition and judgment appear intact. Alert and cooperative with normal attention span and concentration. No apparent delusions, illusions, hallucinations   Impression & Recommendations:  Problem # 1:  HYPERTENSION (ICD-401.9) Assessment Deteriorated  The following medications were removed from the medication list:    Valturna  300-320 Mg Tabs (Aliskiren-valsartan) ..... One by mouth once daily for high blood pressure His updated medication list for this problem includes:    Exforge Hct 5-160-12.5 Mg Tabs (Amlodipine-valsartan-hctz) ..... One by mouth once daily for high blood pressure  BP today: 148/82 Prior BP: 136/84 (04/30/2010)  Prior 10 Yr Risk Heart Disease: Not enough information (05/28/2009)  Labs Reviewed: K+: 3.9 (04/14/2010) Creat: : 1.2 (04/14/2010)   Chol: 231 (05/28/2009)   HDL: 62.20 (05/28/2009)   TG: 85.0 (05/28/2009)  Complete Medication List: 1)  Levothyroxine Sodium 175 Mcg Tabs (Levothyroxine sodium) .... Take 1 tablet by mouth once a day 2)  Aspirin 81 Mg Tbec (Aspirin) 3)  Pravastatin Sodium 40 Mg Tabs (Pravastatin sodium) .... Take 1 tab by mouth at bedtime 4)  Prevacid 15 Mg Cpdr (Lansoprazole) 5)  Elocon 0.1 % Crea (Mometasone furoate) .... Apply to rash on chest two times a day as needed 6)  Align Caps (Probiotic product) 7)  Avodart 0.5 Mg Caps (Dutasteride) .... One daily 8)  Flomax 0.4 Mg Caps (Tamsulosin hcl) .... One daily 9)  Exforge Hct 5-160-12.5 Mg Tabs (Amlodipine-valsartan-hctz) .... One by mouth once daily for high blood pressure  Patient Instructions: 1)  Please schedule a follow-up appointment in 1 month. 2)  Check your Blood Pressure regularly. If it is above 140/90: you should make an appointment. Prescriptions: EXFORGE HCT 5-160-12.5 MG TABS (AMLODIPINE-VALSARTAN-HCTZ) One by mouth once daily for high blood pressure  #28 x 0   Entered and Authorized by:   [name] MD   Signed by:   [name] MD on 05/13/2010   Method used:   Samples Given   RxID:   [id number]

## 2010-12-30 NOTE — Miscellaneous (Signed)
Summary: Flu Vaccination/[facility]  Flu Vaccination/[facility]   Imported By: Sherian Rein 07/30/2010 09:10:58  _____________________________________________________________________  External Attachment:    Type:   Image     Comment:   External Document

## 2010-12-30 NOTE — Progress Notes (Signed)
  Phone Note Call from Patient Call back at [phone]   Summary of Call: Pt's bp is ranging 150's over 80's every day. Also c/o h/a. Please adivse, pt had recent change in bp med.  Initial call taken by: Lamar Sprinkles, CMA,  May 12, 2010 3:23 PM  Follow-up for Phone Call        come in for f/up Follow-up by: [name] MD,  May 12, 2010 4:49 PM  Additional Follow-up for Phone Call Additional follow up Details #1::        no answer.............Marland KitchenLamar Sprinkles, CMA  May 13, 2010 9:57 AM     Additional Follow-up for Phone Call Additional follow up Details #2::    Called pt twice, no answer,no machine. Will try again. Follow-up by: Verdell Face,  May 13, 2010 10:32 AM  Additional Follow-up for Phone Call Additional follow up Details #3:: Details for Additional Follow-up Action Taken: Scheduled for today Additional Follow-up by: Lamar Sprinkles, CMA,  May 13, 2010 2:56 PM

## 2010-12-30 NOTE — Progress Notes (Signed)
Summary: ECHO RESULTS  Phone Note Call from Patient Call back at [phone]   Caller: Patient Complaint: Urinary/GYN Problems Summary of Call: PT CALLING FOR ECHO RESULTS Initial call taken by: Judie Grieve,  October 26, 2010 9:04 AM  Follow-up for Phone Call        Called patient back and gave prelim echo results. He is aware that Dr.Annalyn Blecher needs to review the study and I will call him back again after that happens. I advised him that the report was only sent to his primary care doctor.  Layne Benton, RN, BSN  October 26, 2010 4:20 PM    Additional Follow-up for Phone Call Additional follow up Details #1::        See echo report. Additional Follow-up by: Sherrill Raring, MD, [facility],  October 27, 2010 11:36 AM

## 2010-12-30 NOTE — Assessment & Plan Note (Addendum)
Summary: Cardiology Nuclear Testing  Nuclear Med Background Indications for Stress Test: Evaluation for Ischemia, Abnormal EKG  Indications Comments: Bradycardia.  Evaluate heart rate response.  History: Asthma, GXT  History Comments:  ~15 GXT:OK per patient  Symptoms: Chest Pain, Chest Pain with Exertion, Dizziness, DOE, Fatigue, Near Syncope, Palpitations  Symptoms Comments: Last episode of CP:one week ago.   Nuclear Pre-Procedure Cardiac Risk Factors: Family History - CAD, Hypertension, Lipids, Overweight Caffeine/Decaff Intake: None NPO After: 9:00 PM Lungs: Clear. IV 0.9% NS with Angio Cath: 22g     IV Site: R Antecubital IV Started by: Irean Hong, RN Chest Size (in) 44     Height (in): 69 Weight (lb): 205 BMI: 30.38 Tech Comments: a.m. meds. taken  Nuclear Med Study 1 or 2 day study:  1 day     Stress Test Type:  Treadmill/Lexiscan Reading MD:  Charlton Haws, MD     Referring MD:  Dietrich Pates, MD Resting Radionuclide:  Technetium 23m Tetrofosmin     Resting Radionuclide Dose:  11 mCi  Stress Radionuclide:  Technetium 53m Tetrofosmin     Stress Radionuclide Dose:  33 mCi   Stress Protocol Exercise Time (min):  9:31 min     Max HR:  118 bpm     Predicted Max HR:  156 bpm  Max Systolic BP: 236 mm Hg     Percent Max HR:  75.64 %     METS: 9.5 Rate Pressure Product:  16109  Lexiscan: 0.4 mg   Stress Test Technologist:  Rea [employer], CMA-N     Nuclear Technologist:  Domenic Polite, CNMT  Rest Procedure  Myocardial perfusion imaging was performed at rest 45 minutes following the intravenous administration of Technetium 24m Tetrofosmin.  Stress Procedure  The patient initially walked the treadmill using the Bruce protocol for 8:30, but was unable to achieve his target heart rate due to a hypertensive response, 236/97.  He was then given IV Lexiscan 0.4 mg over 15-seconds with concurrent low level exercise and then Technetium 93m Tetrofosmin was injected at 30-seconds.   There were no diagnostic ST-T wave changes with infusion; only rare PVC and PAC.  Quantitative spect images were obtained after a 45 minute delay.  QPS Raw Data Images:  Normal; no motion artifact; normal heart/lung ratio. Stress Images:  Normal homogeneous uptake in all areas of the myocardium. Rest Images:  Normal homogeneous uptake in all areas of the myocardium. Subtraction (SDS):  SDS 2 at apex Transient Ischemic Dilatation:  1.07  (Normal <1.22)  Lung/Heart Ratio:  0.27  (Normal <0.45)  Quantitative Gated Spect Images QGS EDV:  158 ml QGS ESV:  76 ml QGS EF:  52 % QGS cine images:  normal  Findings Normal nuclear study      Overall Impression  Exercise Capacity: Exercise with lexiscan BP Response: Hypertensive blood pressure response. Clinical Symptoms: Dizzy ECG Impression: No significant ST segment change suggestive of ischemia. Overall Impression: Apical thinning not thought to be significant.  Does appear to be cardiomegaly and HTN  Appended Document: Cardiology Nuclear Testing Blood flow looks normal   Workload good. I would sched echo to confirm LV size and function.  Qualitatively appeard larger on myoview.  Appended Document: Cardiology Nuclear Testing [facility] for call back.  Appended Document: Cardiology Nuclear Testing Patient aware of results.  Appended Document: Cardiology Nuclear Testing Reviewed with EP  They are not convinced that HR is too slow.  Would recomm CPX after holidays to evaluate overall metabolic capacity, tease  out if heart vs lungs vs deconditioning explain SOB  Appended Document: Cardiology Nuclear Testing [facility] for call back.   Appended Document: Cardiology Nuclear Testing Called patient and [facility] for call back.

## 2010-12-30 NOTE — Assessment & Plan Note (Signed)
Summary: [facility]ST [facility]GE[facility]   STC   Vital Signs:  Patient profile:   65 year old male Height:      69 inches Weight:      211 pounds BMI:     31.27 O2 Sat:      97 % on Room air Temp:     98.4 degrees F oral Pulse rate:   75 / minute Pulse rhythm:   regular Resp:     16 per minute BP sitting:   128 / 72  (left arm) Cuff size:   large  Vitals Entered By: Rock Nephew CMA (November 19, 2010 9:55 AM)  Nutrition Counseling: Patient's BMI is greater than 25 and therefore counseled on weight management options.  O2 Flow:  Room air CC: Patient c/o cough, congestion, headache, URI symptoms Is Patient Diabetic? No Pain Assessment Patient in pain? no       Does patient need assistance? Functional Status Self care Ambulation Normal   Primary Care Provider:  [name] MD  CC:  Patient c/o cough, congestion, headache, and URI symptoms.  History of Present Illness:  URI Symptoms      This is a 65 year old man who presents with URI symptoms.  The symptoms began 1 week ago.  The severity is described as mild.  The patient reports nasal congestion, purulent nasal discharge, sore throat, productive cough, and sick contacts, but denies clear nasal discharge, dry cough, and earache.  The patient denies fever, stiff neck, dyspnea, wheezing, rash, vomiting, diarrhea, use of an antipyretic, and response to antipyretic.  The patient denies itchy throat, sneezing, seasonal symptoms, headache, muscle aches, and severe fatigue.  Risk factors for Strep sinusitis include unilateral facial pain, unilateral nasal discharge, poor response to decongestant, and double sickening.  The patient denies the following risk factors for Strep sinusitis: tooth pain, Strep exposure, tender adenopathy, and absence of cough.    Preventive Screening-Counseling & Management  Alcohol-Tobacco     Alcohol drinks/day: 0     Alcohol Counseling: not indicated; patient does not drink     Smoking Status: never  Tobacco Counseling: not indicated; no tobacco use  Hep-HIV-STD-Contraception     Hepatitis Risk: no risk noted     HIV Risk: no risk noted     STD Risk: no risk noted      Sexual History:  currently monogamous.        Drug Use:  no.        Blood Transfusions:  no.    Clinical Review Panels:  Prevention   Last Colonoscopy:  Normal (07/11/2007)   Last PSA:  1.71 (05/28/2009)  Immunizations   Last Tetanus Booster:  Historical (11/28/2004)   Last Flu Vaccine:  Fluvax MCR (07/23/2010)   Last Pneumovax:  Historical (11/28/2004)  Lipid Management   Cholesterol:  231 (05/28/2009)   HDL (good cholesterol):  62.20 (05/28/2009)  Diabetes Management   Creatinine:  1.3 (09/30/2010)   Last Flu Vaccine:  Fluvax MCR (07/23/2010)   Last Pneumovax:  Historical (11/28/2004)  CBC   WBC:  8.7 (09/30/2010)   RBC:  4.25 (09/30/2010)   Hgb:  13.3 (09/30/2010)   Hct:  39.2 (09/30/2010)   Platelets:  265.0 (09/30/2010)   MCV  92.3 (09/30/2010)   MCHC  33.8 (09/30/2010)   RDW  14.2 (09/30/2010)   PMN:  71.9 (09/30/2010)   Lymphs:  14.6 (09/30/2010)   Monos:  9.2 (09/30/2010)   Eosinophils:  3.9 (09/30/2010)   Basophil:  0.4 (09/30/2010)  Complete Metabolic Panel   Glucose:  95 (09/30/2010)   Sodium:  140 (09/30/2010)   Potassium:  4.0 (09/30/2010)   Chloride:  102 (09/30/2010)   CO2:  31 (09/30/2010)   BUN:  16 (09/30/2010)   Creatinine:  1.3 (09/30/2010)   Albumin:  4.0 (09/30/2010)   Total Protein:  7.4 (09/30/2010)   Calcium:  9.4 (09/30/2010)   Total Bili:  0.7 (09/30/2010)   Alk Phos:  60 (09/30/2010)   SGPT (ALT):  14 (09/30/2010)   SGOT (AST):  17 (09/30/2010)   Medications Prior to Update: 1)  Levothyroxine Sodium 175 Mcg Tabs (Levothyroxine Sodium) .... Take 1 Tablet By Mouth Once A Day 2)  Aspirin 81 Mg Tbec (Aspirin) 3)  Pravastatin Sodium 40 Mg Tabs (Pravastatin Sodium) .... Take 1 Tab By Mouth At Bedtime 4)  Prevacid 15 Mg Cpdr (Lansoprazole) 5)  Elocon 0.1 %  Crea (Mometasone Furoate) .... Apply To Rash On Chest Two Times A Day As Needed 6)  Align  Caps (Probiotic Product) 7)  Avodart 0.5 Mg Caps (Dutasteride) .... One Daily 8)  Flomax 0.4 Mg Caps (Tamsulosin Hcl) .... One Daily 9)  Exforge Hct 5-160-12.5 Mg Tabs (Amlodipine-Valsartan-Hctz) .... One By Mouth Once Daily For High Blood Pressure  Current Medications (verified): 1)  Levothyroxine Sodium 175 Mcg Tabs (Levothyroxine Sodium) .... Take 1 Tablet By Mouth Once A Day 2)  Aspirin 81 Mg Tbec (Aspirin) 3)  Pravastatin Sodium 40 Mg Tabs (Pravastatin Sodium) .... Take 1 Tab By Mouth At Bedtime 4)  Prevacid 15 Mg Cpdr (Lansoprazole) 5)  Elocon 0.1 % Crea (Mometasone Furoate) .... Apply To Rash On Chest Two Times A Day As Needed 6)  Align  Caps (Probiotic Product) 7)  Avodart 0.5 Mg Caps (Dutasteride) .... One Daily 8)  Flomax 0.4 Mg Caps (Tamsulosin Hcl) .... One Daily 9)  Exforge Hct 5-160-12.5 Mg Tabs (Amlodipine-Valsartan-Hctz) .... One By Mouth Once Daily For High Blood Pressure 10)  Cefuroxime Axetil 500 Mg Tabs (Cefuroxime Axetil) .... One By Mouth Two Times A Day 11)  Guiatuss Ac 100-10 Mg/22ml Syrp (Guaifenesin-Codeine) .... 5-10 Ml By Mouth Qid As Needed For Cough  Allergies (verified): 1)  ! * Lisinopril  Past History:  Past Medical History: Last updated: 05/28/2009 GERD Hyperlipidemia Hypertension Hypothyroidism Osteoarthritis  Past Surgical History: Last updated: 05/28/2009 Total knee replacement Thyroidectomy Tonsillectomy  Family History: Last updated: 28-Oct-2010 Mother died of brain tumor at 88 Fahter died of MI at 85 Brother died of MI at 26  Social History: Last updated: 05/28/2009 Retired Married Never Smoked Alcohol use-no Drug use-no Regular exercise-yes  Risk Factors: Alcohol Use: 0 (11/19/2010) Exercise: yes (11/10/2009)  Risk Factors: Smoking Status: never (11/19/2010)  Family History: Reviewed history from 10-28-10 and no changes  required. Mother died of brain tumor at 31 Fahter died of MI at 67 Brother died of MI at 46  Social History: Reviewed history from 05/28/2009 and no changes required. Retired Married Never Smoked Alcohol use-no Drug use-no Regular exercise-yes  Review of Systems  The patient denies anorexia, fever, weight loss, weight gain, chest pain, syncope, dyspnea on exertion, peripheral edema, headaches, hemoptysis, abdominal pain, hematuria, muscle weakness, suspicious skin lesions, transient blindness, enlarged lymph nodes, and angioedema.    Physical Exam  General:  alert, well-developed, well-nourished, well-hydrated, normal appearance, healthy-appearing, cooperative to examination, and good hygiene.  overweight-appearing.   Head:  normocephalic, atraumatic, and no abnormalities observed.   Eyes:  vision grossly intact and pupils equal.   Ears:  R  ear normal and no external deformities.   Nose:  no external deformity, no mucosal edema, no airflow obstruction, no intranasal foreign body, no nasal polyps, no nasal mucosal lesions, no mucosal friability, nasal dischargemucosal pallor, mucosal edema, L maxillary sinus tenderness, and R maxillary sinus tenderness.   Mouth:  good dentition, pharynx pink and moist, no erythema, no exudates, no posterior lymphoid hypertrophy, no postnasal drip, no pharyngeal crowing, no lesions, no aphthous ulcers, no erosions, no tongue abnormalities, no leukoplakia, and no petechiae.   Neck:  supple, full ROM, no masses, no thyromegaly, no thyroid nodules or tenderness, no JVD, normal carotid upstroke, no carotid bruits, no cervical lymphadenopathy, and no neck tenderness.   Lungs:  normal respiratory effort, no intercostal retractions, no accessory muscle use, normal breath sounds, no dullness, no fremitus, no crackles, and no wheezes.   Heart:  Normal rate and regular rhythm. S1 and S2 normal without gallop, murmur, click, rub or other extra sounds. Abdomen:  soft,  non-tender, normal bowel sounds, no distention, no masses, no guarding, no rigidity, no rebound tenderness, no abdominal hernia, no inguinal hernia, no hepatomegaly, and no splenomegaly.   Msk:  normal ROM, no joint swelling, no joint warmth, no redness over joints, no joint deformities, no joint instability, no crepitation, no muscle atrophy, and joint tenderness over the MCP joints bilaterally. Pulses:  R and L carotid,radial,femoral,dorsalis pedis and posterior tibial pulses are full and equal bilaterally Extremities:  No clubbing, cyanosis, edema, or deformity noted with normal full range of motion of all joints.   Neurologic:  No cranial nerve deficits noted. Station and gait are normal. Plantar reflexes are down-going bilaterally. DTRs are symmetrical throughout. Sensory, motor and coordinative functions appear intact. Skin:  turgor normal, color normal, no rashes, no suspicious lesions, no ecchymoses, no petechiae, no purpura, no ulcerations, and no edema.   Cervical Nodes:  no anterior cervical adenopathy and no posterior cervical adenopathy.   Axillary Nodes:  no R axillary adenopathy and no L axillary adenopathy.   Psych:  Cognition and judgment appear intact. Alert and cooperative with normal attention span and concentration. No apparent delusions, illusions, hallucinations   Impression & Recommendations:  Problem # 1:  SINUSITIS- ACUTE-NOS (ICD-461.9) Assessment New  His updated medication list for this problem includes:    Cefuroxime Axetil 500 Mg Tabs (Cefuroxime axetil) ..... One by mouth two times a day    Guiatuss Ac 100-10 Mg/56ml Syrp (Guaifenesin-codeine) .Marland Kitchen... 5-10 ml by mouth qid as needed for cough  Instructed on treatment. Call if symptoms persist or worsen.   Problem # 2:  HYPERTENSION (ICD-401.9) Assessment: Improved  His updated medication list for this problem includes:    Exforge Hct 5-160-12.5 Mg Tabs (Amlodipine-valsartan-hctz) ..... One by mouth once daily for  high blood pressure  BP today: 128/72 Prior BP: 144/84 (10/01/2010)  Prior 10 Yr Risk Heart Disease: Not enough information (05/28/2009)  Labs Reviewed: K+: 4.0 (09/30/2010) Creat: : 1.3 (09/30/2010)   Chol: 231 (05/28/2009)   HDL: 62.20 (05/28/2009)   TG: 85.0 (05/28/2009)  Problem # 3:  HYPOTHYROIDISM (ICD-244.9) Assessment: Improved  His updated medication list for this problem includes:    Levothyroxine Sodium 175 Mcg Tabs (Levothyroxine sodium) .Marland Kitchen... Take 1 tablet by mouth once a day  Labs Reviewed: TSH: 2.47 (09/30/2010)    Chol: 231 (05/28/2009)   HDL: 62.20 (05/28/2009)   TG: 85.0 (05/28/2009)  Complete Medication List: 1)  Levothyroxine Sodium 175 Mcg Tabs (Levothyroxine sodium) .... Take 1 tablet by mouth once  a day 2)  Aspirin 81 Mg Tbec (Aspirin) 3)  Pravastatin Sodium 40 Mg Tabs (Pravastatin sodium) .... Take 1 tab by mouth at bedtime 4)  Prevacid 15 Mg Cpdr (Lansoprazole) 5)  Elocon 0.1 % Crea (Mometasone furoate) .... Apply to rash on chest two times a day as needed 6)  Align Caps (Probiotic product) 7)  Avodart 0.5 Mg Caps (Dutasteride) .... One daily 8)  Flomax 0.4 Mg Caps (Tamsulosin hcl) .... One daily 9)  Exforge Hct 5-160-12.5 Mg Tabs (Amlodipine-valsartan-hctz) .... One by mouth once daily for high blood pressure 10)  Cefuroxime Axetil 500 Mg Tabs (Cefuroxime axetil) .... One by mouth two times a day 11)  Guiatuss Ac 100-10 Mg/107ml Syrp (Guaifenesin-codeine) .... 5-10 ml by mouth qid as needed for cough  Patient Instructions: 1)  Please schedule a follow-up appointment in 1 month. 2)  Take your antibiotic as prescribed until ALL of it is gone, but stop if you develop a rash or swelling and contact our office as soon as possible. 3)  Acute sinusitis symptoms for less than 10 days are not helped by antibiotics.Use warm moist compresses, and over the counter decongestants ( only as directed). Call if no improvement in 5-7 days, sooner if increasing pain, fever,  or new symptoms. Prescriptions: GUIATUSS AC 100-10 MG/5ML SYRP (GUAIFENESIN-CODEINE) 5-10 ml by mouth QID as needed for cough  #8 ounces x 0   Entered and Authorized by:   [name] MD   Signed by:   [name] MD on 11/19/2010   Method used:   Print then Give to Patient   RxID:   [id number] CEFUROXIME AXETIL 500 MG TABS (CEFUROXIME AXETIL) One by mouth two times a day  #20 x 1   Entered and Authorized by:   [name] MD   Signed by:   [name] MD on 11/19/2010   Method used:   Print then Give to Patient   RxID:   [id number]    Orders Added: 1)  Est. Patient Level IV [84696]

## 2010-12-30 NOTE — Assessment & Plan Note (Signed)
Summary: 4 mo rov / nws  #   Vital Signs:  Patient profile:   65 year old male Height:      69 inches Weight:      207 pounds BMI:     30.68 O2 Sat:      98 % on Room air Temp:     97.8 degrees F oral Pulse rate:   65 / minute Pulse rhythm:   regular Resp:     16 per minute BP sitting:   170 / 90  (left arm) Cuff size:   regular  Vitals Entered By: Bill Salinas CMA (Apr 14, 2010 10:45 AM)  Nutrition Counseling: Patient's BMI is greater than 25 and therefore counseled on weight management options.  O2 Flow:  Room air CC: pt here with c/o stiffness and tight feeling (like fluid retention) in both hands x 1 month, pt also states he has been very slugish with no energy/ ab,  Hypertension Management Is Patient Diabetic? No Pain Assessment Patient in pain? no        Primary Care Provider:  [name] MD  CC:  pt here with c/o stiffness and tight feeling (like fluid retention) in both hands x 1 month, pt also states he has been very slugish with no energy/ ab, and Hypertension Management.  History of Present Illness:  Follow-Up Visit      This is a 65 year old man who presents for Follow-up visit.  The patient complains of edema, but denies chest pain, palpitations, dizziness, syncope, SOB, DOE, PND, and orthopnea.  Since the last visit the patient notes problems with medications.  The patient reports taking meds as prescribed, not monitoring BP, and dietary compliance.  When questioned about possible medication side effects, the patient notes fatigue.    Dyspepsia History:      He has no alarm features of dyspepsia including no history of melena, hematochezia, dysphagia, persistent vomiting, or involuntary weight loss > 5%.  There is a prior history of GERD.  The patient does not have a prior history of documented ulcer disease.  The dominant symptom is heartburn or acid reflux.  An H-2 blocker medication is currently being taken.  He notes that the symptoms have improved with  the H-2 blocker therapy.  Symptoms have not persisted after 4 weeks of H-2 blocker treatment.    Hypertension History:      He complains of side effects from treatment, but denies headache, chest pain, palpitations, dyspnea with exertion, PND, peripheral edema, visual symptoms, neurologic problems, and syncope.  He notes the following problems with antihypertensive medication side effects: swelling.        Positive major cardiovascular risk factors include male age 65 years old or older, hyperlipidemia, hypertension, and family history for ischemic heart disease (males less than 35 years old).  Negative major cardiovascular risk factors include no history of diabetes and non-tobacco-user status.        Further assessment for target organ damage reveals no history of ASHD, cardiac end-organ damage (CHF/LVH), stroke/TIA, peripheral vascular disease, renal insufficiency, or hypertensive retinopathy.      Preventive Screening-Counseling & Management  Alcohol-Tobacco     Alcohol drinks/day: 0     Smoking Status: never  Hep-HIV-STD-Contraception     Hepatitis Risk: no risk noted     HIV Risk: no risk noted     STD Risk: no risk noted      Sexual History:  currently monogamous.  Drug Use:  no.        Blood Transfusions:  no.    Clinical Review Panels:  Lipid Management   Cholesterol:  231 (05/28/2009)   HDL (good cholesterol):  62.20 (05/28/2009)  Diabetes Management   Creatinine:  1.2 (10/14/2009)   Last Flu Vaccine:  Fluvax MCR (08/19/2009)   Last Pneumovax:  Historical (11/28/2004)  CBC   WBC:  7.3 (10/14/2009)   RBC:  4.36 (10/14/2009)   Hgb:  13.4 (10/14/2009)   Hct:  40.5 (10/14/2009)   Platelets:  213.0 (10/14/2009)   MCV  92.9 (10/14/2009)   MCHC  33.1 (10/14/2009)   RDW  13.4 (10/14/2009)   PMN:  68.9 (10/14/2009)   Lymphs:  16.8 (10/14/2009)   Monos:  8.6 (10/14/2009)   Eosinophils:  5.3 (10/14/2009)   Basophil:  0.4 (10/14/2009)  Complete Metabolic  Panel   Glucose:  99 (10/14/2009)   Sodium:  141 (10/14/2009)   Potassium:  3.6 (10/14/2009)   Chloride:  105 (10/14/2009)   CO2:  29 (10/14/2009)   BUN:  11 (10/14/2009)   Creatinine:  1.2 (10/14/2009)   Albumin:  4.3 (10/14/2009)   Total Protein:  7.8 (10/14/2009)   Calcium:  9.3 (10/14/2009)   Total Bili:  0.8 (10/14/2009)   Alk Phos:  78 (10/14/2009)   SGPT (ALT):  17 (10/14/2009)   SGOT (AST):  19 (10/14/2009)   Medications Prior to Update: 1)  Amlodipine Besylate 10 Mg Tabs (Amlodipine Besylate) .... Take 1 Tablet By Mouth Once A Day 2)  Levothyroxine Sodium 175 Mcg Tabs (Levothyroxine Sodium) .... Take 1 Tablet By Mouth Once A Day 3)  Aspirin 81 Mg Tbec (Aspirin) 4)  Pravastatin Sodium 40 Mg Tabs (Pravastatin Sodium) .... Take 1 Tab By Mouth At Bedtime 5)  Prevacid 15 Mg Cpdr (Lansoprazole) 6)  Elocon 0.1 % Crea (Mometasone Furoate) .... Apply To Rash On Chest Two Times A Day As Needed 7)  Align  Caps (Probiotic Product) 8)  Avodart 0.5 Mg Caps (Dutasteride) .... One Daily 9)  Flomax 0.4 Mg Caps (Tamsulosin Hcl) .... One Daily  Current Medications (verified): 1)  Levothyroxine Sodium 175 Mcg Tabs (Levothyroxine Sodium) .... Take 1 Tablet By Mouth Once A Day 2)  Aspirin 81 Mg Tbec (Aspirin) 3)  Pravastatin Sodium 40 Mg Tabs (Pravastatin Sodium) .... Take 1 Tab By Mouth At Bedtime 4)  Prevacid 15 Mg Cpdr (Lansoprazole) 5)  Elocon 0.1 % Crea (Mometasone Furoate) .... Apply To Rash On Chest Two Times A Day As Needed 6)  Align  Caps (Probiotic Product) 7)  Avodart 0.5 Mg Caps (Dutasteride) .... One Daily 8)  Flomax 0.4 Mg Caps (Tamsulosin Hcl) .... One Daily 9)  Valturna 300-320 Mg Tabs (Aliskiren-Valsartan) .... One By Mouth Once Daily For High Blood Pressure  Allergies (verified): 1)  ! * Lisinopril  Past History:  Past Medical History: Reviewed history from 05/28/2009 and no changes required. GERD Hyperlipidemia Hypertension Hypothyroidism Osteoarthritis  Past  Surgical History: Reviewed history from 05/28/2009 and no changes required. Total knee replacement Thyroidectomy Tonsillectomy  Family History: Reviewed history from 05/28/2009 and no changes required. Family History of CAD Male 1st degree relative <50  Social History: Reviewed history from 05/28/2009 and no changes required. Retired Married Never Smoked Alcohol use-no Drug use-no Regular exercise-yes Hepatitis Risk:  no risk noted HIV Risk:  no risk noted STD Risk:  no risk noted Sexual History:  currently monogamous Blood Transfusions:  no  Review of Systems  The patient complains of peripheral edema.  The patient denies anorexia, fever, weight loss, weight gain, chest pain, syncope, dyspnea on exertion, prolonged cough, headaches, hemoptysis, abdominal pain, melena, hematochezia, severe indigestion/heartburn, hematuria, suspicious skin lesions, difficulty walking, depression, unusual weight change, enlarged lymph nodes, and angioedema.   General:  Complains of fatigue and sleep disorder; denies chills, fever, loss of appetite, malaise, sweats, weakness, and weight loss. GU:  Complains of nocturia and urinary frequency; denies discharge, dysuria, hematuria, incontinence, and urinary hesitancy. MS:  Complains of joint pain, joint swelling, muscle aches, and stiffness; denies joint redness, loss of strength, low back pain, mid back pain, muscle, cramps, muscle weakness, and thoracic pain. Endo:  Complains of polyuria; denies cold intolerance, excessive hunger, excessive thirst, excessive urination, heat intolerance, and weight change.  Physical Exam  General:  alert, well-developed, well-nourished, well-hydrated, normal appearance, healthy-appearing, cooperative to examination, and good hygiene.  overweight-appearing.   Head:  normocephalic, atraumatic, and no abnormalities observed.   Eyes:  no icterus. vision grossly intact, pupils equal, pupils round, pupils reactive to  light, pupils react to accomodation, and a-v nicking.   Mouth:  Oral mucosa and oropharynx without lesions or exudates.  Teeth in good repair. Neck:  supple, full ROM, no masses, no thyromegaly, no thyroid nodules or tenderness, no JVD, normal carotid upstroke, no carotid bruits, no cervical lymphadenopathy, and no neck tenderness.   Lungs:  normal respiratory effort, no intercostal retractions, no accessory muscle use, normal breath sounds, no dullness, no fremitus, no crackles, and no wheezes.   Heart:  Normal rate and regular rhythm. S1 and S2 normal without gallop, murmur, click, rub or other extra sounds. Abdomen:  soft, non-tender, normal bowel sounds, no distention, no masses, no guarding, no rigidity, no rebound tenderness, no abdominal hernia, no inguinal hernia, no hepatomegaly, and no splenomegaly.   Msk:  normal ROM, no joint swelling, no joint warmth, no redness over joints, no joint deformities, no joint instability, no crepitation, no muscle atrophy, and joint tenderness over the MCP joints bilaterally. Pulses:  R and L carotid,radial,femoral,dorsalis pedis and posterior tibial pulses are full and equal bilaterally Extremities:  No clubbing, cyanosis, edema, or deformity noted with normal full range of motion of all joints.   Neurologic:  No cranial nerve deficits noted. Station and gait are normal. Plantar reflexes are down-going bilaterally. DTRs are symmetrical throughout. Sensory, motor and coordinative functions appear intact. Skin:  turgor normal, color normal, no rashes, no suspicious lesions, no ecchymoses, no petechiae, no purpura, no ulcerations, and no edema.   Cervical Nodes:  no anterior cervical adenopathy and no posterior cervical adenopathy.   Axillary Nodes:  no R axillary adenopathy and no L axillary adenopathy.   Inguinal Nodes:  no R inguinal adenopathy and no L inguinal adenopathy.   Psych:  Cognition and judgment appear intact. Alert and cooperative with normal  attention span and concentration. No apparent delusions, illusions, hallucinations   Impression & Recommendations:  Problem # 1:  ARTHRALGIA (ICD-719.40) Assessment New will look for inflammatory arthropathy, myopathy, etc. may need to stop the statin. Orders: Venipuncture (52841) TLB-BMP (Basic Metabolic Panel-BMET) (80048-METABOL) TLB-CBC Platelet - w/Differential (85025-CBCD) TLB-Hepatic/Liver Function Pnl (80076-HEPATIC) TLB-TSH (Thyroid Stimulating Hormone) (84443-TSH) TLB-CK Total Only(Creatine Kinase/CPK) (82550-CK) TLB-Udip w/ Micro (81001-URINE) T-Antinuclear Antib (ANA) (32440-10272) TLB-Rheumatoid Factor (RA) (53664-QI) TLB-Sedimentation Rate (ESR) (85652-ESR) TLB-CRP-High Sensitivity (C-Reactive Protein) (86140-FCRP)  Problem # 2:  PROSTATITIS, ACUTE (ICD-601.0) Assessment: Improved  Orders: Venipuncture (34742) TLB-BMP (Basic Metabolic Panel-BMET) (80048-METABOL) TLB-CBC Platelet - w/Differential (85025-CBCD)  TLB-Hepatic/Liver Function Pnl (80076-HEPATIC) TLB-TSH (Thyroid Stimulating Hormone) (84443-TSH) TLB-CK Total Only(Creatine Kinase/CPK) (82550-CK) TLB-Udip w/ Micro (81001-URINE) T-Antinuclear Antib (ANA) (16109-60454) TLB-Rheumatoid Factor (RA) (09811-BJ) TLB-Sedimentation Rate (ESR) (85652-ESR) TLB-CRP-High Sensitivity (C-Reactive Protein) (86140-FCRP)  Problem # 3:  HYPERTROPHY PROSTATE W/UR OBST & OTH LUTS (ICD-600.01) Assessment: Deteriorated  His updated medication list for this problem includes:    Avodart 0.5 Mg Caps (Dutasteride) ..... One daily    Flomax 0.4 Mg Caps (Tamsulosin hcl) ..... One daily  PSA: 1.71 (05/28/2009)     Problem # 4:  HYPOTHYROIDISM (ICD-244.9) Assessment: Unchanged  His updated medication list for this problem includes:    Levothyroxine Sodium 175 Mcg Tabs (Levothyroxine sodium) .Marland Kitchen... Take 1 tablet by mouth once a day  Orders: Venipuncture (47829) TLB-BMP (Basic Metabolic Panel-BMET) (80048-METABOL) TLB-CBC  Platelet - w/Differential (85025-CBCD) TLB-Hepatic/Liver Function Pnl (80076-HEPATIC) TLB-TSH (Thyroid Stimulating Hormone) (84443-TSH) TLB-CK Total Only(Creatine Kinase/CPK) (82550-CK) TLB-Udip w/ Micro (81001-URINE) T-Antinuclear Antib (ANA) (56213-08657) TLB-Rheumatoid Factor (RA) (84696-EX) TLB-Sedimentation Rate (ESR) (85652-ESR) TLB-CRP-High Sensitivity (C-Reactive Protein) (86140-FCRP)  Labs Reviewed: TSH: 0.61 (10/14/2009)    Chol: 231 (05/28/2009)   HDL: 62.20 (05/28/2009)   TG: 85.0 (05/28/2009)  Problem # 5:  HYPERTENSION (ICD-401.9) Assessment: Deteriorated  The following medications were removed from the medication list:    Amlodipine Besylate 10 Mg Tabs (Amlodipine besylate) .Marland Kitchen... Take 1 tablet by mouth once a day His updated medication list for this problem includes:    Valturna 300-320 Mg Tabs (Aliskiren-valsartan) ..... One by mouth once daily for high blood pressure  Orders: Venipuncture (52841) TLB-BMP (Basic Metabolic Panel-BMET) (80048-METABOL) TLB-CBC Platelet - w/Differential (85025-CBCD) TLB-Hepatic/Liver Function Pnl (80076-HEPATIC) TLB-TSH (Thyroid Stimulating Hormone) (84443-TSH) TLB-CK Total Only(Creatine Kinase/CPK) (82550-CK) TLB-Udip w/ Micro (81001-URINE) T-Antinuclear Antib (ANA) (32440-10272) TLB-Rheumatoid Factor (RA) (53664-QI) TLB-Sedimentation Rate (ESR) (85652-ESR) TLB-CRP-High Sensitivity (C-Reactive Protein) (86140-FCRP)  BP today: 170/90 Prior BP: 138/82 (12/10/2009)  Prior 10 Yr Risk Heart Disease: Not enough information (05/28/2009)  Labs Reviewed: K+: 3.6 (10/14/2009) Creat: : 1.2 (10/14/2009)   Chol: 231 (05/28/2009)   HDL: 62.20 (05/28/2009)   TG: 85.0 (05/28/2009)  Problem # 6:  HYPERLIPIDEMIA (ICD-272.4) Assessment: Unchanged  His updated medication list for this problem includes:    Pravastatin Sodium 40 Mg Tabs (Pravastatin sodium) .Marland Kitchen... Take 1 tab by mouth at bedtime  Orders: Venipuncture (34742) TLB-BMP (Basic  Metabolic Panel-BMET) (80048-METABOL) TLB-CBC Platelet - w/Differential (85025-CBCD) TLB-Hepatic/Liver Function Pnl (80076-HEPATIC) TLB-TSH (Thyroid Stimulating Hormone) (84443-TSH) TLB-CK Total Only(Creatine Kinase/CPK) (82550-CK) TLB-Udip w/ Micro (81001-URINE) T-Antinuclear Antib (ANA) (59563-87564) TLB-Rheumatoid Factor (RA) (33295-JO) TLB-Sedimentation Rate (ESR) (85652-ESR) TLB-CRP-High Sensitivity (C-Reactive Protein) (86140-FCRP)  Labs Reviewed: SGOT: 19 (10/14/2009)   SGPT: 17 (10/14/2009)  Prior 10 Yr Risk Heart Disease: Not enough information (05/28/2009)   HDL:62.20 (05/28/2009)  Chol:231 (05/28/2009)  Trig:85.0 (05/28/2009)  Problem # 7:  GERD (ICD-530.81) Assessment: Improved  His updated medication list for this problem includes:    Prevacid 15 Mg Cpdr (Lansoprazole)  Orders: Venipuncture (84166) TLB-BMP (Basic Metabolic Panel-BMET) (80048-METABOL) TLB-CBC Platelet - w/Differential (85025-CBCD) TLB-Hepatic/Liver Function Pnl (80076-HEPATIC) TLB-TSH (Thyroid Stimulating Hormone) (84443-TSH) TLB-CK Total Only(Creatine Kinase/CPK) (82550-CK) TLB-Udip w/ Micro (81001-URINE) T-Antinuclear Antib (ANA) (06301-60109) TLB-Rheumatoid Factor (RA) (32355-DD) TLB-Sedimentation Rate (ESR) (85652-ESR) TLB-CRP-High Sensitivity (C-Reactive Protein) (86140-FCRP)  Labs Reviewed: Hgb: 13.4 (10/14/2009)   Hct: 40.5 (10/14/2009)  Complete Medication List: 1)  Levothyroxine Sodium 175 Mcg Tabs (Levothyroxine sodium) .... Take 1 tablet by mouth once a day 2)  Aspirin 81 Mg Tbec (Aspirin) 3)  Pravastatin Sodium 40 Mg Tabs (Pravastatin sodium) .... Take 1  tab by mouth at bedtime 4)  Prevacid 15 Mg Cpdr (Lansoprazole) 5)  Elocon 0.1 % Crea (Mometasone furoate) .... Apply to rash on chest two times a day as needed 6)  Align Caps (Probiotic product) 7)  Avodart 0.5 Mg Caps (Dutasteride) .... One daily 8)  Flomax 0.4 Mg Caps (Tamsulosin hcl) .... One daily 9)  Valturna 300-320 Mg  Tabs (Aliskiren-valsartan) .... One by mouth once daily for high blood pressure  Hypertension Assessment/Plan:      The patient's hypertensive risk group is category B: At least one risk factor (excluding diabetes) with no target organ damage.  Today's blood pressure is 170/90.  His blood pressure goal is < 140/90.  Patient Instructions: 1)  Please schedule a follow-up appointment in 2 weeks. 2)  Limit your Sodium (Salt) to less than 4 grams a day (slightly less than 1 teaspoon) to prevent fluid retention, swelling, or worsening or symptoms. 3)  It is important that you exercise regularly at least 20 minutes 5 times a week. If you develop chest pain, have severe difficulty breathing, or feel very tired , stop exercising immediately and seek medical attention. 4)  You need to lose weight. Consider a lower calorie diet and regular exercise.  5)  Check your Blood Pressure regularly. If it is above 140/90: you should make an appointment. Prescriptions: VALTURNA 300-320 MG TABS (ALISKIREN-VALSARTAN) One by mouth once daily for high blood pressure  #70 x 0   Entered and Authorized by:   [name] MD   Signed by:   [name] MD on 04/14/2010   Method used:   Samples Given   RxID:   [id number]

## 2010-12-30 NOTE — Letter (Signed)
Summary: Results Follow-up Letter  [facility]  [address] [place], [place] [address]   Phone: [phone]  Fax: [phone]    09/30/2010  [address] [place], [place]  [address]  Dear Ronald Bond,   The following are the results of your recent test(s):  Test     Result     heart enzymes   normal thyroid     normal liver/kidney   normal cbc       normal   _________________________________________________________  Please call for an appointment as directed _________________________________________________________ _________________________________________________________ _________________________________________________________  Sincerely,  Sanda Linger MD [facility]

## 2010-12-30 NOTE — Assessment & Plan Note (Signed)
Summary: IRREGULAR HEARTBEAT/ SLIGHT PAIN/OUT OF TOWN/ WANTED THURS AM...   Vital Signs:  Patient profile:   65 year old male Height:      69 inches Weight:      205.75 pounds BMI:     30.49 O2 Sat:      99 % on Room air Temp:     97.1 degrees F oral Pulse rate:   48 / minute Pulse rhythm:   regular Resp:     16 per minute BP sitting:   140 / 82  (left arm) Cuff size:   large  Vitals Entered By: Rock Nephew CMA (September 30, 2010 8:19 AM)  Nutrition Counseling: Patient's BMI is greater than 25 and therefore counseled on weight management options.  O2 Flow:  Room air CC: Patient c/o heart palpitations, chest pain, , Hypertension Management Is Patient Diabetic? No Pain Assessment Patient in pain? no       Does patient need assistance? Functional Status Self care Ambulation Normal   Primary Care Provider:  [name] MD  CC:  Patient c/o heart palpitations, chest pain, , and Hypertension Management.  History of Present Illness: He returns c/o a 3 day hx. of chest pain that occurs at rest and with movement in his arms and his torso. The pain alternates between dull and sharp and occasionally radiates into his right arm but not his left arm. He denies any SOB, DOE, diaphoresis, nausea, syncope, or near-syncope but he has had the sensation that his heart was "fluttering".  Dyspepsia History:      He has no alarm features of dyspepsia including no history of melena, hematochezia, dysphagia, persistent vomiting, or involuntary weight loss > 5%.  There is a prior history of GERD.  The patient does not have a prior history of documented ulcer disease.    Hypertension History:      He complains of chest pain and palpitations, but denies headache, dyspnea with exertion, orthopnea, PND, peripheral edema, visual symptoms, neurologic problems, syncope, and side effects from treatment.  He notes no problems with any antihypertensive medication side effects.        Positive  major cardiovascular risk factors include male age 21 years old or older, hyperlipidemia, hypertension, and family history for ischemic heart disease (males less than 63 years old).  Negative major cardiovascular risk factors include no history of diabetes and non-tobacco-user status.        Further assessment for target organ damage reveals no history of ASHD, cardiac end-organ damage (CHF/LVH), stroke/TIA, peripheral vascular disease, renal insufficiency, or hypertensive retinopathy.      Preventive Screening-Counseling & Management  Alcohol-Tobacco     Alcohol drinks/day: 0     Alcohol Counseling: not indicated; patient does not drink     Smoking Status: never     Tobacco Counseling: not indicated; no tobacco use  Hep-HIV-STD-Contraception     Hepatitis Risk: no risk noted     HIV Risk: no risk noted     STD Risk: no risk noted      Sexual History:  currently monogamous.        Drug Use:  no.        Blood Transfusions:  no.    Clinical Review Panels:  Prevention   Last Colonoscopy:  Normal (07/11/2007)   Last PSA:  1.71 (05/28/2009)  Immunizations   Last Tetanus Booster:  Historical (11/28/2004)   Last Flu Vaccine:  Fluvax MCR (07/23/2010)   Last Pneumovax:  Historical (11/28/2004)  Lipid Management   Cholesterol:  231 (05/28/2009)   HDL (good cholesterol):  62.20 (05/28/2009)  Diabetes Management   Creatinine:  1.2 (04/14/2010)   Last Flu Vaccine:  Fluvax MCR (07/23/2010)   Last Pneumovax:  Historical (11/28/2004)  CBC   WBC:  9.6 (04/14/2010)   RBC:  4.58 (04/14/2010)   Hgb:  14.1 (04/14/2010)   Hct:  41.6 (04/14/2010)   Platelets:  264.0 (04/14/2010)   MCV  90.9 (04/14/2010)   MCHC  34.0 (04/14/2010)   RDW  14.4 (04/14/2010)   PMN:  76.8 (04/14/2010)   Lymphs:  14.2 (04/14/2010)   Monos:  6.9 (04/14/2010)   Eosinophils:  1.6 (04/14/2010)   Basophil:  0.5 (04/14/2010)  Complete Metabolic Panel   Glucose:  94 (04/14/2010)   Sodium:  141 (04/14/2010)    Potassium:  3.9 (04/14/2010)   Chloride:  100 (04/14/2010)   CO2:  31 (04/14/2010)   BUN:  14 (04/14/2010)   Creatinine:  1.2 (04/14/2010)   Albumin:  4.3 (04/14/2010)   Total Protein:  7.9 (04/14/2010)   Calcium:  9.8 (04/14/2010)   Total Bili:  0.5 (04/14/2010)   Alk Phos:  81 (04/14/2010)   SGPT (ALT):  19 (04/14/2010)   SGOT (AST):  21 (04/14/2010)   Medications Prior to Update: 1)  Levothyroxine Sodium 175 Mcg Tabs (Levothyroxine Sodium) .... Take 1 Tablet By Mouth Once A Day 2)  Aspirin 81 Mg Tbec (Aspirin) 3)  Pravastatin Sodium 40 Mg Tabs (Pravastatin Sodium) .... Take 1 Tab By Mouth At Bedtime 4)  Prevacid 15 Mg Cpdr (Lansoprazole) 5)  Elocon 0.1 % Crea (Mometasone Furoate) .... Apply To Rash On Chest Two Times A Day As Needed 6)  Align  Caps (Probiotic Product) 7)  Avodart 0.5 Mg Caps (Dutasteride) .... One Daily 8)  Flomax 0.4 Mg Caps (Tamsulosin Hcl) .... One Daily 9)  Exforge Hct 5-160-12.5 Mg Tabs (Amlodipine-Valsartan-Hctz) .... One By Mouth Once Daily For High Blood Pressure  Current Medications (verified): 1)  Levothyroxine Sodium 175 Mcg Tabs (Levothyroxine Sodium) .... Take 1 Tablet By Mouth Once A Day 2)  Aspirin 81 Mg Tbec (Aspirin) 3)  Pravastatin Sodium 40 Mg Tabs (Pravastatin Sodium) .... Take 1 Tab By Mouth At Bedtime 4)  Prevacid 15 Mg Cpdr (Lansoprazole) 5)  Elocon 0.1 % Crea (Mometasone Furoate) .... Apply To Rash On Chest Two Times A Day As Needed 6)  Align  Caps (Probiotic Product) 7)  Avodart 0.5 Mg Caps (Dutasteride) .... One Daily 8)  Flomax 0.4 Mg Caps (Tamsulosin Hcl) .... One Daily 9)  Exforge Hct 5-160-12.5 Mg Tabs (Amlodipine-Valsartan-Hctz) .... One By Mouth Once Daily For High Blood Pressure  Allergies (verified): 1)  ! * Lisinopril  Past History:  Past Medical History: Last updated: 05/28/2009 GERD Hyperlipidemia Hypertension Hypothyroidism Osteoarthritis  Past Surgical History: Last updated: 05/28/2009 Total knee  replacement Thyroidectomy Tonsillectomy  Family History: Last updated: 05/28/2009 Family History of CAD Male 1st degree relative <50  Social History: Last updated: 05/28/2009 Retired Married Never Smoked Alcohol use-no Drug use-no Regular exercise-yes  Risk Factors: Alcohol Use: 0 (09/30/2010) Exercise: yes (11/10/2009)  Risk Factors: Smoking Status: never (09/30/2010)  Family History: Reviewed history from 05/28/2009 and no changes required. Family History of CAD Male 1st degree relative <50  Social History: Reviewed history from 05/28/2009 and no changes required. Retired Married Never Smoked Alcohol use-no Drug use-no Regular exercise-yes  Review of Systems  The patient denies anorexia, fever, weight loss, weight gain, prolonged cough, headaches, hemoptysis, abdominal pain,  melena, hematochezia, severe indigestion/heartburn, suspicious skin lesions, and enlarged lymph nodes.   CV:  Complains of chest pain or discomfort and palpitations; denies difficulty breathing at night, difficulty breathing while lying down, fainting, fatigue, leg cramps with exertion, lightheadness, near fainting, shortness of breath with exertion, swelling of feet, swelling of hands, and weight gain. Endo:  Denies cold intolerance, excessive hunger, excessive thirst, excessive urination, heat intolerance, polyuria, and weight change.  Physical Exam  General:  alert, well-developed, well-nourished, well-hydrated, normal appearance, healthy-appearing, cooperative to examination, and good hygiene.  overweight-appearing.   Mouth:  Oral mucosa and oropharynx without lesions or exudates.  Teeth in good repair. Neck:  supple, full ROM, no masses, no thyromegaly, no thyroid nodules or tenderness, no JVD, normal carotid upstroke, no carotid bruits, no cervical lymphadenopathy, and no neck tenderness.   Lungs:  normal respiratory effort, no intercostal retractions, no accessory muscle use, normal breath  sounds, no dullness, no fremitus, no crackles, and no wheezes.   Heart:  Normal rate and regular rhythm. S1 and S2 normal without gallop, murmur, click, rub or other extra sounds. Abdomen:  soft, non-tender, normal bowel sounds, no distention, no masses, no guarding, no rigidity, no rebound tenderness, no abdominal hernia, no inguinal hernia, no hepatomegaly, and no splenomegaly.   Msk:  normal ROM, no joint swelling, no joint warmth, no redness over joints, no joint deformities, no joint instability, no crepitation, no muscle atrophy, and joint tenderness over the MCP joints bilaterally. Pulses:  R and L carotid,radial,femoral,dorsalis pedis and posterior tibial pulses are full and equal bilaterally Extremities:  No clubbing, cyanosis, edema, or deformity noted with normal full range of motion of all joints.   Neurologic:  No cranial nerve deficits noted. Station and gait are normal. Plantar reflexes are down-going bilaterally. DTRs are symmetrical throughout. Sensory, motor and coordinative functions appear intact. Skin:  turgor normal, color normal, no rashes, no suspicious lesions, no ecchymoses, no petechiae, no purpura, no ulcerations, and no edema.   Cervical Nodes:  no anterior cervical adenopathy and no posterior cervical adenopathy.   Psych:  Cognition and judgment appear intact. Alert and cooperative with normal attention span and concentration. No apparent delusions, illusions, hallucinations Additional Exam:  EKG does not show any ischemia today   Impression & Recommendations:  Problem # 1:  CHEST PAIN (ICD-786.50) Assessment New this sounds atypical but will screen for ischemia with cardiac enzymes today Orders: Venipuncture (16109) TLB-Cardiac Panel ([id number]) TLB-BMP (Basic Metabolic Panel-BMET) (80048-METABOL) TLB-CBC Platelet - w/Differential (85025-CBCD) TLB-TSH (Thyroid Stimulating Hormone) (84443-TSH) TLB-Hepatic/Liver Function Pnl (80076-HEPATIC) EKG w/  Interpretation (93000)  Problem # 2:  ABNORMAL ELECTROCARDIOGRAM (ICD-794.31) Assessment: Unchanged he has a bradycardia and has had some palpitations so I will ask Cardiology to see him Orders: Cardiology Referral (Cardiology) EKG w/ Interpretation (93000)  Problem # 3:  HYPOTHYROIDISM (ICD-244.9) Assessment: Unchanged  His updated medication list for this problem includes:    Levothyroxine Sodium 175 Mcg Tabs (Levothyroxine sodium) .Marland Kitchen... Take 1 tablet by mouth once a day  Orders: Venipuncture (82956) TLB-Cardiac Panel ([id number]) TLB-BMP (Basic Metabolic Panel-BMET) (80048-METABOL) TLB-CBC Platelet - w/Differential (85025-CBCD) TLB-TSH (Thyroid Stimulating Hormone) (84443-TSH) TLB-Hepatic/Liver Function Pnl (80076-HEPATIC)  Labs Reviewed: TSH: 1.36 (04/14/2010)    Chol: 231 (05/28/2009)   HDL: 62.20 (05/28/2009)   TG: 85.0 (05/28/2009)  Problem # 4:  HYPERTENSION (ICD-401.9) Assessment: Unchanged  His updated medication list for this problem includes:    Exforge Hct 5-160-12.5 Mg Tabs (Amlodipine-valsartan-hctz) ..... One by mouth once daily for high blood pressure  Orders: Venipuncture (04540) TLB-Cardiac Panel ([id number]-[id number]) TLB-BMP (Basic Metabolic Panel-BMET) (80048-METABOL) TLB-CBC Platelet - w/Differential (85025-CBCD) TLB-TSH (Thyroid Stimulating Hormone) (84443-TSH) TLB-Hepatic/Liver Function Pnl (80076-HEPATIC)  BP today: 140/82 Prior BP: 148/82 (05/13/2010)  Prior 10 Yr Risk Heart Disease: Not enough information (05/28/2009)  Labs Reviewed: K+: 3.9 (04/14/2010) Creat: : 1.2 (04/14/2010)   Chol: 231 (05/28/2009)   HDL: 62.20 (05/28/2009)   TG: 85.0 (05/28/2009)  Complete Medication List: 1)  Levothyroxine Sodium 175 Mcg Tabs (Levothyroxine sodium) .... Take 1 tablet by mouth once a day 2)  Aspirin 81 Mg Tbec (Aspirin) 3)  Pravastatin Sodium 40 Mg Tabs (Pravastatin sodium) .... Take 1 tab by mouth at bedtime 4)  Prevacid 15 Mg Cpdr  (Lansoprazole) 5)  Elocon 0.1 % Crea (Mometasone furoate) .... Apply to rash on chest two times a day as needed 6)  Align Caps (Probiotic product) 7)  Avodart 0.5 Mg Caps (Dutasteride) .... One daily 8)  Flomax 0.4 Mg Caps (Tamsulosin hcl) .... One daily 9)  Exforge Hct 5-160-12.5 Mg Tabs (Amlodipine-valsartan-hctz) .... One by mouth once daily for high blood pressure  Hypertension Assessment/Plan:      The patient's hypertensive risk group is category B: At least one risk factor (excluding diabetes) with no target organ damage.  Today's blood pressure is 140/82.  His blood pressure goal is < 140/90.  Patient Instructions: 1)  Please schedule a follow-up appointment in 2 weeks. 2)  Check your Blood Pressure regularly. If it is above: you should make an appointment.   Orders Added: 1)  Cardiology Referral [Cardiology] 2)  Venipuncture [13086] 3)  TLB-Cardiac Panel [[id number]-CARD] 4)  TLB-BMP (Basic Metabolic Panel-BMET) [80048-METABOL] 5)  TLB-CBC Platelet - w/Differential [85025-CBCD] 6)  TLB-TSH (Thyroid Stimulating Hormone) [84443-TSH] 7)  TLB-Hepatic/Liver Function Pnl [80076-HEPATIC] 8)  EKG w/ Interpretation [93000] 9)  Est. Patient Level IV [57846]

## 2010-12-30 NOTE — Assessment & Plan Note (Signed)
Summary: 1 MTH FU  STC   Vital Signs:  Patient profile:   65 year old male Height:      69 inches Weight:      200 pounds BMI:     29.64 O2 Sat:      98 % on Room air Temp:     97.3 degrees F oral Pulse rate:   66 / minute Pulse rhythm:   regular Resp:     16 per minute BP sitting:   138 / 82  (left arm) Cuff size:   large  Vitals Entered By: Rock Nephew CMA (December 10, 2009 8:44 AM)  Nutrition Counseling: Patient's BMI is greater than 25 and therefore counseled on weight management options.  O2 Flow:  Room air  Primary Care Provider:  [name] MD   History of Present Illness: He returns and states he feels much better but still has some urinary symptoms.  Dyspepsia History:      He has no alarm features of dyspepsia including no history of melena, hematochezia, dysphagia, persistent vomiting, or involuntary weight loss > 5%.  There is a prior history of GERD.     Preventive Screening-Counseling & Management  Alcohol-Tobacco     Alcohol drinks/day: 0     Smoking Status: never  Current Medications (verified): 1)  Amlodipine Besylate 10 Mg Tabs (Amlodipine Besylate) .... Take 1 Tablet By Mouth Once A Day 2)  Levothyroxine Sodium 175 Mcg Tabs (Levothyroxine Sodium) .... Take 1 Tablet By Mouth Once A Day 3)  Aspirin 81 Mg Tbec (Aspirin) 4)  Pravastatin Sodium 40 Mg Tabs (Pravastatin Sodium) .... Take 1 Tab By Mouth At Bedtime 5)  Prevacid 15 Mg Cpdr (Lansoprazole) 6)  Elocon 0.1 % Crea (Mometasone Furoate) .... Apply To Rash On Chest Two Times A Day As Needed 7)  Align  Caps (Probiotic Product)  Allergies (verified): 1)  ! * Lisinopril  Past History:  Past Medical History: Reviewed history from 05/28/2009 and no changes required. GERD Hyperlipidemia Hypertension Hypothyroidism Osteoarthritis  Past Surgical History: Reviewed history from 05/28/2009 and no changes required. Total knee replacement Thyroidectomy Tonsillectomy  Family  History: Reviewed history from 05/28/2009 and no changes required. Family History of CAD Male 1st degree relative <50  Social History: Reviewed history from 05/28/2009 and no changes required. Retired Married Never Smoked Alcohol use-no Drug use-no Regular exercise-yes  Review of Systems  The patient denies anorexia, fever, weight loss, chest pain, abdominal pain, hematuria, unusual weight change, angioedema, and testicular masses.   GU:  Complains of nocturia, urinary frequency, and urinary hesitancy; denies discharge, dysuria, hematuria, and incontinence.  Physical Exam  General:  alert, well-developed, well-nourished, well-hydrated, normal appearance, healthy-appearing, cooperative to examination, and good hygiene.  overweight-appearing.   Mouth:  Oral mucosa and oropharynx without lesions or exudates.  Teeth in good repair. Neck:  supple, full ROM, no masses, no thyromegaly, no carotid bruits, no cervical lymphadenopathy, and no neck tenderness.   Lungs:  normal respiratory effort, no intercostal retractions, no accessory muscle use, normal breath sounds, and no dullness.   Heart:  normal rate, regular rhythm, no murmur, and no gallop.   Abdomen:  soft, normal bowel sounds, no distention, no masses, no guarding, no rigidity, no rebound tenderness, no abdominal hernia, no inguinal hernia, no hepatomegaly, no splenomegaly, and RUQ tenderness.   Msk:  normal ROM, no joint tenderness, no joint swelling, and no joint warmth.   Pulses:  R and L carotid,radial,femoral,dorsalis pedis and posterior tibial pulses  are full and equal bilaterally Extremities:  No clubbing, cyanosis, edema, or deformity noted with normal full range of motion of all joints.   Neurologic:  No cranial nerve deficits noted. Station and gait are normal. Plantar reflexes are down-going bilaterally. DTRs are symmetrical throughout. Sensory, motor and coordinative functions appear intact. Skin:  he has scaling in the  midline of his chest Cervical Nodes:  no anterior cervical adenopathy and no posterior cervical adenopathy.   Inguinal Nodes:  no R inguinal adenopathy and no L inguinal adenopathy.   Psych:  Cognition and judgment appear intact. Alert and cooperative with normal attention span and concentration. No apparent delusions, illusions, hallucinations   Impression & Recommendations:  Problem # 1:  PROSTATITIS, ACUTE (ICD-601.0) Assessment Improved  Orders: UA Dipstick W/ Micro (manual) (23557)  Problem # 2:  HYPERTROPHY PROSTATE W/UR OBST & OTH LUTS (ICD-600.01) Assessment: Deteriorated  His updated medication list for this problem includes:    Jalyn 0.5-0.4 Mg Caps (Dutasteride-tamsulosin hcl) ..... One by mouth once daily for prostate  Orders: UA Dipstick W/ Micro (manual) (32202)  PSA: 1.71 (05/28/2009)     Problem # 3:  HYPERTENSION (ICD-401.9) Assessment: Improved  His updated medication list for this problem includes:    Amlodipine Besylate 10 Mg Tabs (Amlodipine besylate) .Marland Kitchen... Take 1 tablet by mouth once a day  BP today: 138/82 Prior BP: 140/80 (11/10/2009)  Prior 10 Yr Risk Heart Disease: Not enough information (05/28/2009)  Labs Reviewed: K+: 3.6 (10/14/2009) Creat: : 1.2 (10/14/2009)   Chol: 231 (05/28/2009)   HDL: 62.20 (05/28/2009)   TG: 85.0 (05/28/2009)  Problem # 4:  HYPOTHYROIDISM (ICD-244.9) Assessment: Unchanged  His updated medication list for this problem includes:    Levothyroxine Sodium 175 Mcg Tabs (Levothyroxine sodium) .Marland Kitchen... Take 1 tablet by mouth once a day  Labs Reviewed: TSH: 0.61 (10/14/2009)    Chol: 231 (05/28/2009)   HDL: 62.20 (05/28/2009)   TG: 85.0 (05/28/2009)  Complete Medication List: 1)  Amlodipine Besylate 10 Mg Tabs (Amlodipine besylate) .... Take 1 tablet by mouth once a day 2)  Levothyroxine Sodium 175 Mcg Tabs (Levothyroxine sodium) .... Take 1 tablet by mouth once a day 3)  Aspirin 81 Mg Tbec (Aspirin) 4)  Pravastatin Sodium  40 Mg Tabs (Pravastatin sodium) .... Take 1 tab by mouth at bedtime 5)  Prevacid 15 Mg Cpdr (Lansoprazole) 6)  Elocon 0.1 % Crea (Mometasone furoate) .... Apply to rash on chest two times a day as needed 7)  Align Caps (Probiotic product) 8)  Jalyn 0.5-0.4 Mg Caps (Dutasteride-tamsulosin hcl) .... One by mouth once daily for prostate  Patient Instructions: 1)  Please schedule a follow-up appointment in 4 months. 2)  Check your Blood Pressure regularly. If it is above: you should make an appointment. Prescriptions: JALYN 0.5-0.4 MG CAPS (DUTASTERIDE-TAMSULOSIN HCL) One by mouth once daily for prostate  #30 x 11   Entered and Authorized by:   [name] MD   Signed by:   [name] MD on 12/10/2009   Method used:   Print then Give to Patient   RxID:   [id number] Haynes Bast 0.5-0.4 MG CAPS (DUTASTERIDE-TAMSULOSIN HCL) One by mouth once daily for prostate  #7 x 0   Entered and Authorized by:   [name] MD   Signed by:   [name] MD on 12/10/2009   Method used:   Samples Given   RxID:   [id number]   Laboratory Results   Urine Tests  Routine Urinalysis   Color: yellow Appearance: Clear Glucose: negative   (Normal Range: Negative) Bilirubin: negative   (Normal Range: Negative) Ketone: negative   (Normal Range: Negative) Spec. Gravity: 1.020   (Normal Range: 1.003-1.035) Blood: negative   (Normal Range: Negative) pH: 6.5   (Normal Range: 5.0-8.0) Protein: trace   (Normal Range: Negative) Urobilinogen: 0.2   (Normal Range: 0-1) Nitrite: negative   (Normal Range: Negative) Leukocyte Esterace: negative   (Normal Range: Negative)

## 2010-12-30 NOTE — Progress Notes (Signed)
Summary: nuc pre procedure  Phone Note Outgoing Call Call back at [facility] Phone [phone]   Call placed by: Cathlyn Parsons RN,  October 12, 2010 1:53 PM Call placed to: Patient Reason for Call: Confirm/change Appt Summary of Call: Reviewed information on Myoview Information Sheet (see scanned document for further details).  Spoke with patient.      Nuclear Med Background Indications for Stress Test: Evaluation for Ischemia     Symptoms: Chest Pain, Dizziness, DOE, Fatigue, Palpitations  Symptoms Comments: Huston Foley   Nuclear Pre-Procedure Cardiac Risk Factors: Family History - CAD, Hypertension, Lipids Height (in): 69   Appended Document: Cardiology Nuclear Testing Blood flow looks normal   Workload good. I would sched echo to confirm LV size and function.  Qualitatively appeard larger on myoview.  Appended Document: Cardiology Nuclear Testing [facility] for call back.  Appended Document: Cardiology Nuclear Testing Patient aware of results.

## 2010-12-30 NOTE — Progress Notes (Signed)
    Influenza Immunization History:    Influenza # 1:  Fluvax MCR (07/23/2010)

## 2010-12-30 NOTE — Assessment & Plan Note (Signed)
Summary: NP6/ABN EKG/JML   Visit Type:  Follow-up Primary Provider:  [name] MD   History of Present Illness: Patient is a 65 year old who was referred for evaluation of bradycardia and chest pain The patient has no known history of CAD.  He was seen by Dr. Yetta Barre yesterday. He says over the past few wks he has noticed more SOB with activity.  With yard work he has to stop and go rest to catch breath.  This is new for him.  Not associated with chest pain. The patient does have chest pains.  They are left sided, pleuritic, positional.  Notes dry cough.  No fever.  No overexertion history. Also had one episode of R arm throbbing in bed Notes in bed that he will sometimes feel his heart throbbing in his head.  He usually feels this after heavy activity.  While up notes occasional palpitations, skips  but not signif tachyarrhythmia.   Notes occasional dizziness but not syncope.  Dizziness with standing, cnanging position is new.  Current Medications (verified): 1)  Levothyroxine Sodium 175 Mcg Tabs (Levothyroxine Sodium) .... Take 1 Tablet By Mouth Once A Day 2)  Aspirin 81 Mg Tbec (Aspirin) 3)  Pravastatin Sodium 40 Mg Tabs (Pravastatin Sodium) .... Take 1 Tab By Mouth At Bedtime 4)  Prevacid 15 Mg Cpdr (Lansoprazole) 5)  Elocon 0.1 % Crea (Mometasone Furoate) .... Apply To Rash On Chest Two Times A Day As Needed 6)  Align  Caps (Probiotic Product) 7)  Avodart 0.5 Mg Caps (Dutasteride) .... One Daily 8)  Flomax 0.4 Mg Caps (Tamsulosin Hcl) .... One Daily 9)  Exforge Hct 5-160-12.5 Mg Tabs (Amlodipine-Valsartan-Hctz) .... One By Mouth Once Daily For High Blood Pressure  Allergies: 1)  ! * Lisinopril  Past History:  Past medical, surgical, family and social histories (including risk factors) reviewed, and no changes noted (except as noted below).  Past Medical History: Reviewed history from 05/28/2009 and no changes  required. GERD Hyperlipidemia Hypertension Hypothyroidism Osteoarthritis  Past Surgical History: Reviewed history from 05/28/2009 and no changes required. Total knee replacement Thyroidectomy Tonsillectomy  Family History: Reviewed history from 05/28/2009 and no changes required. Mother died of brain tumor at 77 Fahter died of MI at 62 Brother died of MI at 39  Social History: Reviewed history from 05/28/2009 and no changes required. Retired Married Never Smoked Alcohol use-no Drug use-no Regular exercise-yes  Review of Systems       All systmes reviewed.  Neg to the above problem except as noted above.  Vital Signs:  Patient profile:   65 year old male Height:      69 inches Weight:      205 pounds BMI:     30.38 Pulse rate:   58 / minute BP sitting:   144 / 84  (left arm)  Vitals Entered By: Laurance Flatten CMA (October 01, 2010 9:41 AM)  Physical Exam  Additional Exam:  Patient is in NAD HEENT:  Normocephalic, atraumatic. EOMI, PERRLA.  conjunctivae injected bilaterally  Neck: JVP is normal. No thyromegaly. No bruits.  Lungs: clear to auscultation. No rales no wheezes.  Heart: Regular rate and rhythm. Normal S1, S2. No S3.   No significant murmurs. PMI not displaced.  Abdomen:  Supple, nontender. Normal bowel sounds. No masses. No hepatomegaly.  Extremities:   Good distal pulses throughout. No lower extremity edema.  Musculoskeletal :moving all extremities.  Neuro:   alert and oriented x3.    EKG  Procedure date:  10/01/2010  Findings:      Sinus bradycardia.  58 bpm.  LVH.  Impression & Recommendations:  Problem # 1:  CHEST PAIN (ICD-786.50) The patients chest pain   does not appear to be cardiac.  Pleuritic and positional.    I think it is probably musculoskeletal or pleuritic in origin. More concerning is the patient's fatiguability.  SOB.   Given FHX and risk factors i would recomm a stress myoview.  will also schedule an echo.  Problem # 2:   BRADYCARDIA (ICD-427.89) Assessment: Improved I am not convinced it is signif.  He does have occasional dizziness though.  I am not sure what the "throbbing in head" represents.  For now. I would recomm GXT myoview.  Follow HR response on treadmill.  Problem # 3:  HYPERLIPIDEMIA (ICD-272.4) On statin.   Will neeed to follow. His updated medication list for this problem includes:    Pravastatin Sodium 40 Mg Tabs (Pravastatin sodium) .Marland Kitchen... Take 1 tab by mouth at bedtime  Problem # 4:  HYPERTENSION (ICD-401.9) Fair control.  Will need to be followed His updated medication list for this problem includes:    Aspirin 81 Mg Tbec (Aspirin)    Exforge Hct 5-160-12.5 Mg Tabs (Amlodipine-valsartan-hctz) ..... One by mouth once daily for high blood pressure  Other Orders: EKG w/ Interpretation (93000) Nuclear Stress Test (Nuc Stress Test)

## 2010-12-30 NOTE — Progress Notes (Signed)
SummaryHaynes Bond  Phone Note Call from Patient Call back at [facility] Phone [phone]   Summary of Call: Pt recieved letter from insurance that advises pt to req alternative to [place] b/c it will not be covered. Ok to split into two rx's? This may be covered by insurance. Initial call taken by: Lamar Sprinkles, CMA,  January 15, 2010 11:04 AM  Follow-up for Phone Call        yes Follow-up by: [name] MD,  January 15, 2010 11:19 AM  Additional Follow-up for Phone Call Additional follow up Details #1::        Rx changed, Pt informed, he was advised to call office back with any problems. Additional Follow-up by: Lamar Sprinkles, CMA,  January 15, 2010 1:31 PM    New/Updated Medications: AVODART 0.5 MG CAPS (DUTASTERIDE) one daily FLOMAX 0.4 MG CAPS (TAMSULOSIN HCL) one daily Prescriptions: FLOMAX 0.4 MG CAPS (TAMSULOSIN HCL) one daily  #90 x 1   Entered by:   Lamar Sprinkles, CMA   Authorized by:   [name] MD   Signed by:   Lamar Sprinkles, CMA on 01/15/2010   Method used:   Electronically to        [facility]* (retail)       [address] [place], [place]  [address]       Ph: [phone]       Fax: [phone]   RxID:   [id number] AVODART 0.5 MG CAPS (DUTASTERIDE) one daily  #90 x 1   Entered by:   Lamar Sprinkles, CMA   Authorized by:   [name] MD   Signed by:   Lamar Sprinkles, CMA on 01/15/2010   Method used:   Electronically to        [facility]* (retail)       [address] [place], [place]  [address]       Ph: [phone]       Fax: [phone]   RxID:   [id number]

## 2010-12-30 NOTE — Assessment & Plan Note (Signed)
Summary: FU-OYU   Vital Signs:  Patient profile:   65 year old male Height:      69 inches Weight:      206.50 pounds BMI:     30.60 O2 Sat:      98 % on Room air Temp:     97.8 degrees F oral Pulse rate:   50 / minute Pulse rhythm:   regular Resp:     16 per minute BP sitting:   136 / 84  (left arm) Cuff size:   large  Vitals Entered By: Rock Nephew CMA (April 30, 2010 9:36 AM)  Nutrition Counseling: Patient's BMI is greater than 25 and therefore counseled on weight management options.  O2 Flow:  Room air  Primary Care Provider:  [name] MD   History of Present Illness: He returns for f/up and he feels well. His musculoskeletal pain has resolved. He had an insignificant increase in his CPK level consistent with his race.  Hypertension History:      He denies headache, chest pain, palpitations, dyspnea with exertion, orthopnea, PND, peripheral edema, visual symptoms, neurologic problems, syncope, and side effects from treatment.  He notes no problems with any antihypertensive medication side effects.        Positive major cardiovascular risk factors include male age 33 years old or older, hyperlipidemia, hypertension, and family history for ischemic heart disease (males less than 67 years old).  Negative major cardiovascular risk factors include no history of diabetes and non-tobacco-user status.        Further assessment for target organ damage reveals no history of ASHD, cardiac end-organ damage (CHF/LVH), stroke/TIA, peripheral vascular disease, renal insufficiency, or hypertensive retinopathy.    Lipid Management History:      Positive NCEP/ATP III risk factors include male age 35 years old or older, family history for ischemic heart disease (males less than 58 years old), and hypertension.  Negative NCEP/ATP III risk factors include non-diabetic, HDL cholesterol greater than 60, non-tobacco-user status, no ASHD (atherosclerotic heart disease), no prior stroke/TIA, no  peripheral vascular disease, and no history of aortic aneurysm.        The patient states that he knows about the "Therapeutic Lifestyle Change" diet.  His compliance with the TLC diet is good.  The patient expresses understanding of adjunctive measures for cholesterol lowering.  Adjunctive measures started by the patient include aerobic exercise, fiber, limit alcohol consumpton, and weight reduction.  He expresses no side effects from his lipid-lowering medication.  The patient denies any symptoms to suggest myopathy or liver disease.      Current Medications (verified): 1)  Levothyroxine Sodium 175 Mcg Tabs (Levothyroxine Sodium) .... Take 1 Tablet By Mouth Once A Day 2)  Aspirin 81 Mg Tbec (Aspirin) 3)  Pravastatin Sodium 40 Mg Tabs (Pravastatin Sodium) .... Take 1 Tab By Mouth At Bedtime 4)  Prevacid 15 Mg Cpdr (Lansoprazole) 5)  Elocon 0.1 % Crea (Mometasone Furoate) .... Apply To Rash On Chest Two Times A Day As Needed 6)  Align  Caps (Probiotic Product) 7)  Avodart 0.5 Mg Caps (Dutasteride) .... One Daily 8)  Flomax 0.4 Mg Caps (Tamsulosin Hcl) .... One Daily 9)  Valturna 300-320 Mg Tabs (Aliskiren-Valsartan) .... One By Mouth Once Daily For High Blood Pressure  Allergies (verified): 1)  ! * Lisinopril  Past History:  Past Medical History: Last updated: 05/28/2009 GERD Hyperlipidemia Hypertension Hypothyroidism Osteoarthritis  Past Surgical History: Last updated: 05/28/2009 Total knee replacement Thyroidectomy Tonsillectomy  Family  History: Last updated: 05/28/2009 Family History of CAD Male 1st degree relative <50  Social History: Last updated: 05/28/2009 Retired Married Never Smoked Alcohol use-no Drug use-no Regular exercise-yes  Risk Factors: Alcohol Use: 0 (04/14/2010) Exercise: yes (11/10/2009)  Risk Factors: Smoking Status: never (04/14/2010)  Family History: Reviewed history from 05/28/2009 and no changes required. Family History of CAD Male 1st  degree relative <50  Social History: Reviewed history from 05/28/2009 and no changes required. Retired Married Never Smoked Alcohol use-no Drug use-no Regular exercise-yes  Review of Systems  The patient denies weight loss, chest pain, abdominal pain, and suspicious skin lesions.   MS:  Denies joint pain, joint redness, joint swelling, loss of strength, low back pain, mid back pain, muscle aches, stiffness, and thoracic pain.  Physical Exam  General:  alert, well-developed, well-nourished, well-hydrated, normal appearance, healthy-appearing, cooperative to examination, and good hygiene.  overweight-appearing.   Head:  normocephalic, atraumatic, and no abnormalities observed.   Mouth:  Oral mucosa and oropharynx without lesions or exudates.  Teeth in good repair. Neck:  supple, full ROM, no masses, no thyromegaly, no thyroid nodules or tenderness, no JVD, normal carotid upstroke, no carotid bruits, no cervical lymphadenopathy, and no neck tenderness.   Lungs:  normal respiratory effort, no intercostal retractions, no accessory muscle use, normal breath sounds, no dullness, no fremitus, no crackles, and no wheezes.   Heart:  Normal rate and regular rhythm. S1 and S2 normal without gallop, murmur, click, rub or other extra sounds. Abdomen:  soft, non-tender, normal bowel sounds, no distention, no masses, no guarding, no rigidity, no rebound tenderness, no abdominal hernia, no inguinal hernia, no hepatomegaly, and no splenomegaly.   Msk:  normal ROM, no joint swelling, no joint warmth, no redness over joints, no joint deformities, no joint instability, no crepitation, no muscle atrophy, and joint tenderness over the MCP joints bilaterally. Pulses:  R and L carotid,radial,femoral,dorsalis pedis and posterior tibial pulses are full and equal bilaterally Extremities:  No clubbing, cyanosis, edema, or deformity noted with normal full range of motion of all joints.   Neurologic:  No cranial nerve  deficits noted. Station and gait are normal. Plantar reflexes are down-going bilaterally. DTRs are symmetrical throughout. Sensory, motor and coordinative functions appear intact. Skin:  turgor normal, color normal, no rashes, no suspicious lesions, no ecchymoses, no petechiae, no purpura, no ulcerations, and no edema.   Cervical Nodes:  no anterior cervical adenopathy and no posterior cervical adenopathy.   Psych:  Cognition and judgment appear intact. Alert and cooperative with normal attention span and concentration. No apparent delusions, illusions, hallucinations   Impression & Recommendations:  Problem # 1:  ARTHRALGIA (ICD-719.40) Assessment Improved there is no evidence of myopathy or inflammatory disease  Problem # 2:  HYPOTHYROIDISM (ICD-244.9) Assessment: Unchanged  His updated medication list for this problem includes:    Levothyroxine Sodium 175 Mcg Tabs (Levothyroxine sodium) .Marland Kitchen... Take 1 tablet by mouth once a day  Labs Reviewed: TSH: 1.36 (04/14/2010)    Chol: 231 (05/28/2009)   HDL: 62.20 (05/28/2009)   TG: 85.0 (05/28/2009)  Problem # 3:  HYPERTENSION (ICD-401.9) Assessment: Improved  His updated medication list for this problem includes:    Valturna 300-320 Mg Tabs (Aliskiren-valsartan) ..... One by mouth once daily for high blood pressure  BP today: 136/84 Prior BP: 170/90 (04/14/2010)  Prior 10 Yr Risk Heart Disease: Not enough information (05/28/2009)  Labs Reviewed: K+: 3.9 (04/14/2010) Creat: : 1.2 (04/14/2010)   Chol: 231 (05/28/2009)   HDL:  62.20 (05/28/2009)   TG: 85.0 (05/28/2009)  Problem # 4:  HYPERLIPIDEMIA (ICD-272.4) Assessment: Unchanged  His updated medication list for this problem includes:    Pravastatin Sodium 40 Mg Tabs (Pravastatin sodium) .Marland Kitchen... Take 1 tab by mouth at bedtime  Labs Reviewed: SGOT: 21 (04/14/2010)   SGPT: 19 (04/14/2010)  Lipid Goals: Chol Goal: 200 (04/30/2010)   HDL Goal: 40 (04/30/2010)   LDL Goal: 130  (04/30/2010)   TG Goal: 150 (04/30/2010)  Prior 10 Yr Risk Heart Disease: Not enough information (05/28/2009)   HDL:62.20 (05/28/2009)  Chol:231 (05/28/2009)  Trig:85.0 (05/28/2009)  Complete Medication List: 1)  Levothyroxine Sodium 175 Mcg Tabs (Levothyroxine sodium) .... Take 1 tablet by mouth once a day 2)  Aspirin 81 Mg Tbec (Aspirin) 3)  Pravastatin Sodium 40 Mg Tabs (Pravastatin sodium) .... Take 1 tab by mouth at bedtime 4)  Prevacid 15 Mg Cpdr (Lansoprazole) 5)  Elocon 0.1 % Crea (Mometasone furoate) .... Apply to rash on chest two times a day as needed 6)  Align Caps (Probiotic product) 7)  Avodart 0.5 Mg Caps (Dutasteride) .... One daily 8)  Flomax 0.4 Mg Caps (Tamsulosin hcl) .... One daily 9)  Valturna 300-320 Mg Tabs (Aliskiren-valsartan) .... One by mouth once daily for high blood pressure  Hypertension Assessment/Plan:      The patient's hypertensive risk group is category B: At least one risk factor (excluding diabetes) with no target organ damage.  Today's blood pressure is 136/84.  His blood pressure goal is < 140/90.  Lipid Assessment/Plan:      Based on NCEP/ATP III, the patient's risk factor category is "2 or more risk factors and a calculated 10 year CAD risk of > 20%".  The patient's lipid goals are as follows: Total cholesterol goal is 200; LDL cholesterol goal is 130; HDL cholesterol goal is 40; Triglyceride goal is 150.    Patient Instructions: 1)  Please schedule a follow-up appointment in 4 months. 2)  It is important that you exercise regularly at least 20 minutes 5 times a week. If you develop chest pain, have severe difficulty breathing, or feel very tired , stop exercising immediately and seek medical attention. 3)  You need to lose weight. Consider a lower calorie diet and regular exercise.  4)  Check your Blood Pressure regularly. If it is above 140/90: you should make an appointment.

## 2010-12-30 NOTE — Progress Notes (Signed)
Summary: returning call re test results  Phone Note Call from Patient   Caller: Patient [phone] Reason for Call: Talk to Nurse, Lab or Test Results Summary of Call: pt wants results of test from last week-returning call Initial call taken by: Glynda Jaeger,  October 18, 2010 8:15 AM  Follow-up for Phone Call        Patient aware of nuc stress test results. Willing to set up echo. Will send order to [facility].   Layne Benton, RN, BSN  October 18, 2010 9:24 AM   New Problems: CARDIOMEGALY (ICD-429.3)   New Problems: CARDIOMEGALY (ICD-429.3)

## 2010-12-30 NOTE — Letter (Signed)
Summary: Results Follow-up Letter  [facility]  [address] [place], [place] [address]   Phone: [phone]  Fax: [phone]    04/14/2010  [address] [place], [place]  [address]  Dear Mr. Sanborn,   The following are the results of your recent test(s):  Test       Result     Muscle enzyme     slightly elevated Liver/kidney     normal CBC         normal Thyroid       normal Inflammation tests   normal Urine         normal   _________________________________________________________  Please call for an appointment soon, your pain might be from the cholesterol medicine _________________________________________________________ _________________________________________________________ _________________________________________________________  Sincerely,  Sanda Linger MD [facility]

## 2010-12-30 NOTE — Progress Notes (Signed)
  Phone Note Refill Request   Refills Requested: Medication #1:  EXFORGE HCT 5-160-12.5 MG TABS One by mouth once daily for high blood pressure.   Last Refilled: 08/24/2010 Initial call taken by: Rock Nephew CMA,  September 20, 2010 8:30 AM    Prescriptions: EXFORGE HCT 5-160-12.5 MG TABS (AMLODIPINE-VALSARTAN-HCTZ) One by mouth once daily for high blood pressure  #30 x 6   Entered by:   Rock Nephew CMA   Authorized by:   [name] MD   Signed by:   Rock Nephew CMA on 09/20/2010   Method used:   Electronically to        [facility]* (retail)       [address] [place], [place]  [address]       Ph: [phone]       Fax: [phone]   RxID:   [id number]

## 2011-02-24 ENCOUNTER — Ambulatory Visit (INDEPENDENT_AMBULATORY_CARE_PROVIDER_SITE_OTHER): Payer: Medicare Other | Admitting: Internal Medicine

## 2011-02-24 ENCOUNTER — Encounter: Payer: Self-pay | Admitting: Internal Medicine

## 2011-02-24 ENCOUNTER — Other Ambulatory Visit (INDEPENDENT_AMBULATORY_CARE_PROVIDER_SITE_OTHER): Payer: Medicare Other

## 2011-02-24 ENCOUNTER — Other Ambulatory Visit: Payer: Self-pay | Admitting: Internal Medicine

## 2011-02-24 DIAGNOSIS — Z23 Encounter for immunization: Secondary | ICD-10-CM

## 2011-02-24 DIAGNOSIS — R102 Pelvic and perineal pain: Secondary | ICD-10-CM

## 2011-02-24 DIAGNOSIS — N138 Other obstructive and reflux uropathy: Secondary | ICD-10-CM

## 2011-02-24 DIAGNOSIS — R109 Unspecified abdominal pain: Secondary | ICD-10-CM

## 2011-02-24 DIAGNOSIS — E785 Hyperlipidemia, unspecified: Secondary | ICD-10-CM

## 2011-02-24 DIAGNOSIS — N401 Enlarged prostate with lower urinary tract symptoms: Secondary | ICD-10-CM

## 2011-02-24 DIAGNOSIS — E039 Hypothyroidism, unspecified: Secondary | ICD-10-CM

## 2011-02-24 DIAGNOSIS — I10 Essential (primary) hypertension: Secondary | ICD-10-CM

## 2011-02-24 DIAGNOSIS — N410 Acute prostatitis: Secondary | ICD-10-CM

## 2011-02-24 LAB — COMPREHENSIVE METABOLIC PANEL
AST: 19 U/L (ref 0–37)
Albumin: 4 g/dL (ref 3.5–5.2)
BUN: 16 mg/dL (ref 6–23)
CO2: 29 mEq/L (ref 19–32)
Calcium: 9.2 mg/dL (ref 8.4–10.5)
Chloride: 105 mEq/L (ref 96–112)
Creatinine, Ser: 1.3 mg/dL (ref 0.4–1.5)
GFR: 72.48 mL/min (ref >60.00)
Potassium: 3.7 mEq/L (ref 3.5–5.1)

## 2011-02-24 LAB — URINALYSIS, ROUTINE W REFLEX MICROSCOPIC
Hgb urine dipstick: NEGATIVE
Ketones, ur: NEGATIVE
Leukocytes, UA: NEGATIVE
Specific Gravity, Urine: 1.02 (ref 1.000–1.030)
Urine Glucose: NEGATIVE
Urobilinogen, UA: 0.2 (ref 0.0–1.0)

## 2011-02-24 LAB — CBC WITH DIFFERENTIAL/PLATELET
Basophils Relative: 0.5 % (ref 0.0–3.0)
Eosinophils Relative: 2.9 % (ref 0.0–5.0)
Hemoglobin: 13.2 g/dL (ref 13.0–17.0)
Lymphocytes Relative: 19.2 % (ref 12.0–46.0)
Monocytes Relative: 8.9 % (ref 3.0–12.0)
Neutro Abs: 5.4 10*3/uL (ref 1.4–7.7)
Neutrophils Relative %: 68.5 % (ref 43.0–77.0)
RBC: 4.27 Mil/uL (ref 4.22–5.81)
WBC: 7.9 10*3/uL (ref 4.5–10.5)

## 2011-02-24 LAB — HIGH SENSITIVITY CRP: CRP, High Sensitivity: 4.83 mg/L (ref 0.00–5.00)

## 2011-02-24 MED ORDER — ZOSTER VACCINE LIVE 19400 UNT/0.65ML ~~LOC~~ SOLR: 0.6500 mL | Freq: Once | SUBCUTANEOUS | Status: DC

## 2011-02-24 MED ORDER — CIPROFLOXACIN HCL 500 MG PO TABS: 500.0000 mg | ORAL_TABLET | Freq: Two times a day (BID) | ORAL | Status: AC

## 2011-02-24 NOTE — Assessment & Plan Note (Signed)
Will check labs to look for IBD, kidney dz., stones, infection, liver dz., and prostate dz. Will consider a CT scan after review of lab data.

## 2011-02-24 NOTE — Assessment & Plan Note (Signed)
Weight gain is concerning, so will check TSH today

## 2011-02-24 NOTE — Patient Instructions (Signed)
Prostatitis Prostatitis is an inflammation (the body's way of reacting to injury and/or infection) of the prostate gland. The prostate gland is a male organ. The gland is about the size and shape of a walnut. The prostate is located just below the bladder. It produces semen, which is a fluid that helps nourish and transport sperm. Prostatitis is the most common urinary tract problem in men younger than age 65. There are 4 categories of prostatitis:  I - Acute bacterial prostatitis.   II - Chronic bacterial prostatitis.   III - Chronic prostatitis and chronic pelvic pain syndrome (CPPS).   Inflammatory.   Non inflammatory.   IV - Asymptomatic inflammatory prostatitis.  Acute and chronic bacterial prostatitis are problems with bacterial infections of the prostate. "Acute" infection is usually a one-time problem. "Chronic" bacterial prostatitis is a condition with recurrent infection. It is usually caused by the same germ (bacteria). CPPS has symptoms similar to prostate infection. However, no infection is actually found. This condition can cause problems of ongoing pain. Currently, it cannot be cured. Treatments are available and aimed at symptom control.  Asymptomatic inflammatory prostatitis has no symptoms. It is a condition where infection-fighting cells are found by chance in the urine. The diagnosis is made most often during an exam for other conditions. Other conditions could be infertility or a high level of PSA (prostate-specific antigen) in the blood. SYMPTOMS Symptoms can vary depending upon the type of prostatitis that exists. There can also be overlap in symptoms. This can make diagnosis difficult. Symptoms: For Acute bacterial prostatitis  Painful urination.  Fever and/or chills.   Muscle and/or joint pains.   Low back pain.   Low abdominal pain.   Inability to empty bladder completely.   Sudden urges to urinate.  Frequent urination during the day.   Difficulty  starting urine stream.   Need to urinate several times at night (nocturia).   Weak urine stream.   Urethral (tube that carries urine from the bladder out of the body) discharge and dribbling after urination.   For Chronic bacterial prostatitis  Rectal pain.   Pain in the testicles, penis, or tip of the penis.   Pain in the space between the anus and scrotum (perineum).   Low back pain.   Low abdominal pain.   Problems with sexual function.   Painful ejaculation.   Bloody semen.   Inability to empty bladder completely.   Painful urination.   Sudden urges to urinate.   Frequent urination during the day.   Difficulty starting urine stream.   Need to urinate several times at night (nocturia).   Weak urine stream.   Dribbling after urination.   Urethral discharge.   For Chronic prostatitis and chronic pelvic pain syndrome (CPPS) Symptoms are the same as those for chronic bacterial prostatitis. Problems with sexual function are often the reason for seeking care. This important problem should be discussed with your caregiver. For Asymptomatic inflammatory prostatitis As noted above, there are no symptoms with this condition. DIAGNOSIS  Your caregiver may perform a rectal exam. This exam is to determine if the prostate is swollen and tender.   Sometimes blood work is performed. This is done to see if your white blood cell count is elevated. The Prostate Specific Antigen (PSA) is also measured. PSA is a blood test that can help detect early prostate cancer.   A urinalysis is done to find out what type of infection is present if this is a suspected cause. An additional  urinalysis may be done after a digital rectal exam. This is to see if white blood cells are pushed out of the prostate and into the urine. A low-grade infection of the prostate may not be found on the first urinalysis.  In more difficult cases, your caregiver may advise other tests. Tests could  include:  Urodynamics -- Tests the function of the bladder and the organs involved in triggering and controlling normal urination.   Urine flow rate.   Cystoscopy -- In this procedure, a thin, telescope-like tube with a light and tiny camera attached (cystoscope) is inserted into the bladder through the urethra. This allows the caregiver to see the inside of the urethra and bladder.   Electromyography -- This procedure tests how the muscles and nerves of the bladder work. It is focused on the muscles that control the anus and pelvic floor. These are the muscles between the anus and scrotum.  In people who show no signs of infection, certain uncommon infections might be causing constant or recurrent symptoms. These uncommon infections are difficult to detect. More work in medicine may help find solutions to these problems. TREATMENT Antibiotics are used to treat infections caused by germs. If the infection is not treated and becomes long lasting (chronic), it may become a lower grade infection with minor, continual problems. Without treatment, the prostate may develop a boil or furuncle (abscess). This may require surgical treatment. For those with chronic prostatitis and CPPS, it is important to work closely with your primary caregiver and urologist. For some, the medicines that are used to treat a non-cancerous, enlarged prostate (benign prostatic hypertrophy) may be helpful. Referrals to specialists other than urologists may be necessary. In rare cases when all treatments have been inadequate for pain control, an operation to remove the prostate may be recommended. This is very rare and before this is considered thorough discussion with your urologist is highly recommended.  In cases of secondary to chronic non-bacterial prostatitis, a good relationship with your urologist or primary caregiver is essential because it is often a recurrent prolonged condition that requires a good understanding of the  causes and a commitment to therapy aimed at controlling your symptoms. HOME CARE INSTRUCTIONS  Hot sitz baths for 20 minutes, 4 times per day, may help relieve pain.   Non-prescription pain killers may be used as your caregiver recommends if you have no allergies to them. Some illnesses or conditions prevent use of non-prescription drugs. If unsure, check with your caregiver. Take all medications as directed. Take the antibiotics for the prescribed length of time, even if you are feeling better.  SEEK MEDICAL CARE IF:  You have any worsening of the symptoms that originally brought you to your caregiver.   You have an oral temperature above 100.5.   You experience any side effects from medications prescribed.  SEEK IMMEDIATE MEDICAL CARE IF:  You have an oral temperature above 100.5, not controlled by medicine.   You have pain not relieved with medications.   You develop nausea, vomiting, lightheadedness, or have a fainting episode.   You are unable to urinate.   You pass bloody urine or clots.  Document Released: 11/11/2000 Document Re-Released: 02/08/2010 [facility] Patient Information 2011 [place], [place].

## 2011-02-24 NOTE — Assessment & Plan Note (Signed)
Start cipro, check UA and PSA

## 2011-02-24 NOTE — Progress Notes (Signed)
Subjective:    Patient ID: Ronald Bond, male    DOB: Oct 07, 1946, 65 y.o.   MRN: [id number]  Pelvic Pain This is a new problem. The current episode started 1 to 4 weeks ago. The problem occurs intermittently. The problem has been waxing and waning. Associated symptoms include abdominal pain and urinary symptoms. Pertinent negatives include no anorexia, arthralgias, change in bowel habit, chest pain, chills, congestion, coughing, diaphoresis, fatigue, fever, headaches, joint swelling, myalgias, nausea, neck pain, numbness, rash, swollen glands, vertigo, visual change, vomiting or weakness. The symptoms are aggravated by nothing. He has tried nothing for the symptoms.      Review of Systems  Constitutional: Positive for unexpected weight change (weight gain). Negative for fever, chills, diaphoresis, activity change, appetite change and fatigue.  HENT: Negative for congestion and neck pain.   Respiratory: Negative for cough, chest tightness, shortness of breath, wheezing and stridor.   Cardiovascular: Negative for chest pain, palpitations and leg swelling.  Gastrointestinal: Positive for abdominal pain. Negative for nausea, vomiting, diarrhea, constipation, blood in stool, abdominal distention, anal bleeding, rectal pain, anorexia and change in bowel habit.  Genitourinary: Positive for hematuria and pelvic pain. Negative for dysuria, urgency, frequency, decreased urine volume, discharge, penile swelling, scrotal swelling, difficulty urinating, genital sores, penile pain and testicular pain.  Musculoskeletal: Negative for myalgias, back pain, joint swelling, arthralgias and gait problem.  Skin: Negative for rash.  Neurological: Negative for dizziness, vertigo, weakness, numbness and headaches.  Hematological: Negative for adenopathy. Does not bruise/bleed easily.  Psychiatric/Behavioral: Negative for behavioral problems, confusion, decreased concentration and agitation.       Objective:   Physical Exam  Nursing note and vitals reviewed. Constitutional: He is oriented to person, place, and time. He appears well-developed and well-nourished. No distress.  HENT:  Head: Normocephalic and atraumatic.  Right Ear: External ear normal.  Left Ear: External ear normal.  Nose: Nose normal.  Mouth/Throat: Oropharynx is clear and moist. No oropharyngeal exudate.  Eyes: Conjunctivae and EOM are normal. Pupils are equal, round, and reactive to light. Right eye exhibits no discharge. Left eye exhibits no discharge. No scleral icterus.  Neck: Normal range of motion. Neck supple. No thyromegaly present.  Cardiovascular: Normal rate, regular rhythm, normal heart sounds and intact distal pulses.  Exam reveals no gallop and no friction rub.   No murmur heard. Pulmonary/Chest: Effort normal and breath sounds normal. No respiratory distress. He has no wheezes. He has no rales. He exhibits no tenderness.  Abdominal: Soft. Normal appearance, normal aorta and bowel sounds are normal. He exhibits no shifting dullness, no distension, no pulsatile liver, no fluid wave, no abdominal bruit, no ascites, no pulsatile midline mass and no mass. There is no hepatosplenomegaly. There is no tenderness. There is no rebound, no guarding and no CVA tenderness. No hernia. Hernia confirmed negative in the ventral area, confirmed negative in the right inguinal area and confirmed negative in the left inguinal area.  Genitourinary: Rectum normal, testes normal and penis normal. Rectal exam shows no external hemorrhoid, no internal hemorrhoid, no fissure, no mass, no tenderness and anal tone normal. Guaiac negative stool. Prostate is enlarged (2+, no nodules or assym) and tender (and slightly boggy). Cremasteric reflex is present. Right testis shows no mass, no swelling and no tenderness. Left testis shows no mass, no swelling and no tenderness. Uncircumcised. No phimosis, paraphimosis, penile erythema or penile tenderness. No  discharge found.  Musculoskeletal: Normal range of motion. He exhibits no edema and no tenderness.  Lymphadenopathy:  He has no cervical adenopathy.       Left: No inguinal adenopathy present.  Neurological: He is alert and oriented to person, place, and time. No cranial nerve deficit. Coordination normal.  Skin: Skin is warm and dry. No rash noted. He is not diaphoretic. No erythema. No pallor.  Psychiatric: He has a normal mood and affect. His behavior is normal. Judgment and thought content normal.          Assessment & Plan:

## 2011-02-24 NOTE — Assessment & Plan Note (Addendum)
Check PSA today, continue avodart and flomax

## 2011-02-24 NOTE — Assessment & Plan Note (Signed)
BP looks good today and he has no s/s, will check renal fxn. And lytes today

## 2011-02-28 ENCOUNTER — Telehealth: Payer: Self-pay | Admitting: *Deleted

## 2011-02-28 DIAGNOSIS — E039 Hypothyroidism, unspecified: Secondary | ICD-10-CM

## 2011-02-28 MED ORDER — LEVOTHYROXINE SODIUM 125 MCG PO TABS: 125.0000 ug | ORAL_TABLET | Freq: Every day | ORAL | Status: DC

## 2011-02-28 NOTE — Telephone Encounter (Signed)
Yes, I sent a lower dose to his pharmacy

## 2011-02-28 NOTE — Telephone Encounter (Signed)
Patient notified

## 2011-02-28 NOTE — Telephone Encounter (Signed)
Pt called regarding lab letter and wants a call back. I looked at letter and it states thyroid dose is too high, does he need to change dose of med?

## 2011-03-10 ENCOUNTER — Encounter: Payer: Self-pay | Admitting: Internal Medicine

## 2011-03-10 ENCOUNTER — Ambulatory Visit (INDEPENDENT_AMBULATORY_CARE_PROVIDER_SITE_OTHER): Payer: Medicare Other | Admitting: Internal Medicine

## 2011-03-10 VITALS — BP 128/76 | HR 72 | Temp 97.5°F | Wt 211.8 lb

## 2011-03-10 DIAGNOSIS — E039 Hypothyroidism, unspecified: Secondary | ICD-10-CM

## 2011-03-10 DIAGNOSIS — N410 Acute prostatitis: Secondary | ICD-10-CM

## 2011-03-10 DIAGNOSIS — I10 Essential (primary) hypertension: Secondary | ICD-10-CM

## 2011-03-10 DIAGNOSIS — J309 Allergic rhinitis, unspecified: Secondary | ICD-10-CM | POA: Insufficient documentation

## 2011-03-10 MED ORDER — MOMETASONE FUROATE 50 MCG/ACT NA SUSP: 2.0000 | Freq: Every day | NASAL | Status: DC

## 2011-03-10 NOTE — Assessment & Plan Note (Signed)
His BP is well controlled 

## 2011-03-10 NOTE — Patient Instructions (Signed)
Allergic Rhinitis Allergic rhinitis is when the mucous membranes in the nose respond to allergens. Allergens are particles in the air that cause your body to have an allergic reaction. This causes you to release allergic antibodies. Through a chain of events, these eventually cause you to release histamine into the blood stream (hence the use of antihistamines). Although meant to be protective to the body, it is this release that causes your discomfort, such as frequent sneezing, congestion and an itchy runny nose.  CAUSES The pollen allergens may come from grasses, trees, and weeds. This is seasonal allergic rhinitis, or "hay fever." Other allergens cause year-round allergic rhinitis (perennial allergic rhinitis) such as house dust mite allergen, pet dander and mold spores.  SYMPTOMS  Nasal stuffiness (congestion).   Runny, itchy nose with sneezing and tearing of the eyes.   There is often an itching of the mouth, eyes and ears.  It cannot be cured, but it can be controlled with medications. DIAGNOSIS If you are unable to determine the offending allergen, skin or blood testing may find it. TREATMENT  Avoid the allergen.   Medications and allergy shots (immunotherapy) can help.   Hay fever may often be treated with antihistamines in pill or nasal spray forms. Antihistamines block the effects of histamine. There are over-the-counter medicines that may help with nasal congestion and swelling around the eyes. Check with your caregiver before taking or giving this medicine.  If the treatment above does not work, there are many new medications your caregiver can prescribe. Stronger medications may be used if initial measures are ineffective. Desensitizing injections can be used if medications and avoidance fails. Desensitization is when a patient is given ongoing shots until the body becomes less sensitive to the allergen. Make sure you follow up with your caregiver if problems continue. SEEK  MEDICAL CARE IF:   You develop fever (more than 100.5F (38.1 C).   You develop a cough that does not stop easily (persistent).   You have shortness of breath.   You start wheezing.   Symptoms interfere with normal daily activities.  Document Released: 08/09/2001 Document Re-Released: 12/06/2009 [employer] Patient Information 2011 [employer]. 

## 2011-03-10 NOTE — Assessment & Plan Note (Signed)
Start nasonex and try some otc antihistamines like zyrtec

## 2011-03-10 NOTE — Progress Notes (Signed)
Subjective:    Patient ID: MD SMOLA, male    DOB: [birth year], 65 y.o.   MRN: [id number]  HPI He returns for f/up and he tells me that his perineal pain has resolved and he has no urinary symptoms. Today his only complaint is nasal congestion and "allergies."    Review of Systems  Constitutional: Negative for fever, chills, diaphoresis, activity change, appetite change, fatigue and unexpected weight change.  HENT: Positive for congestion, rhinorrhea, sneezing and postnasal drip. Negative for ear pain, nosebleeds, sore throat, facial swelling, trouble swallowing, neck pain, neck stiffness, dental problem, voice change, sinus pressure, tinnitus and ear discharge.   Respiratory: Negative for apnea, cough, choking, chest tightness, shortness of breath, wheezing and stridor.   Cardiovascular: Negative for chest pain, palpitations and leg swelling.  Gastrointestinal: Negative for nausea, vomiting, abdominal pain, diarrhea, constipation, blood in stool, abdominal distention, anal bleeding and rectal pain.  Genitourinary: Negative for dysuria, urgency, frequency, hematuria, flank pain, decreased urine volume, discharge, scrotal swelling, difficulty urinating, genital sores and testicular pain.  Musculoskeletal: Negative for myalgias, back pain, joint swelling, arthralgias and gait problem.  Skin: Negative for color change, pallor and rash.  Neurological: Negative for dizziness, tremors, facial asymmetry, speech difficulty, weakness, light-headedness and headaches.  Hematological: Negative for adenopathy. Does not bruise/bleed easily.  Psychiatric/Behavioral: Negative for behavioral problems, confusion, self-injury, dysphoric mood, decreased concentration and agitation. The patient is not nervous/anxious and is not hyperactive.        Objective:   Physical Exam  Nursing note and vitals reviewed. Constitutional: He is oriented to person, place, and time. He appears well-developed and  well-nourished. No distress.  HENT:  Head: Normocephalic and atraumatic.  Right Ear: External ear normal.  Left Ear: External ear normal.  Nose: Mucosal edema present. No rhinorrhea, sinus tenderness, nasal deformity, septal deviation or nasal septal hematoma. No epistaxis.  No foreign bodies. Right sinus exhibits no maxillary sinus tenderness and no frontal sinus tenderness. Left sinus exhibits no maxillary sinus tenderness and no frontal sinus tenderness.  Mouth/Throat: Oropharynx is clear and moist. No oropharyngeal exudate.  Eyes: Conjunctivae and EOM are normal. Pupils are equal, round, and reactive to light. Right eye exhibits no discharge. Left eye exhibits no discharge. No scleral icterus.  Neck: Normal range of motion. Neck supple. No JVD present. No thyromegaly present.  Cardiovascular: Normal rate, regular rhythm, normal heart sounds and intact distal pulses.  Exam reveals no gallop and no friction rub.   No murmur heard. Pulmonary/Chest: Effort normal and breath sounds normal. No respiratory distress. He has no wheezes. He has no rales. He exhibits no tenderness.  Abdominal: Soft. Bowel sounds are normal. He exhibits no distension and no mass. There is no tenderness. There is no rebound and no guarding.  Musculoskeletal: Normal range of motion. He exhibits no edema and no tenderness.  Lymphadenopathy:    He has no cervical adenopathy.  Neurological: He is alert and oriented to person, place, and time. He has normal reflexes. No cranial nerve deficit. Coordination normal.  Skin: Skin is warm and dry. No rash noted. He is not diaphoretic. No erythema. No pallor.  Psychiatric: He has a normal mood and affect. His behavior is normal. Judgment and thought content normal.       Lab Results  Component Value Date   WBC 7.9 02/24/2011   HGB 13.2 02/24/2011   HCT 38.5* 02/24/2011   PLT 261.0 02/24/2011   CHOL 231* 05/28/2009   TRIG 85.0 05/28/2009   HDL  62.20 05/28/2009   LDLDIRECT 139.2  05/28/2009   ALT 23 02/24/2011   AST 19 02/24/2011   NA 141 02/24/2011   K 3.7 02/24/2011   CL 105 02/24/2011   CREATININE 1.3 02/24/2011   BUN 16 02/24/2011   CO2 29 02/24/2011   TSH 0.13* 02/24/2011   PSA 1.69 02/24/2011      Assessment & Plan:

## 2011-03-10 NOTE — Assessment & Plan Note (Signed)
This has improved on Cipro

## 2011-03-10 NOTE — Assessment & Plan Note (Signed)
His dose has been adjusted, he feels well in the current dose, I will recheck his TSH level in the next few months

## 2011-04-12 NOTE — Op Note (Signed)
NAME:  Ronald Bond, Ronald Bond NO.:  [id number]   MEDICAL RECORD NO.:  [id number]          PATIENT TYPE:  INP   LOCATION:  2899                         FACILITY:  [facility]   PHYSICIAN:  Dyke Brackett, M.D.    DATE OF BIRTH:  11-Oct-1946   DATE OF PROCEDURE:  10/19/2007  DATE OF DISCHARGE:                               OPERATIVE REPORT   INDICATIONS:  This is a 65 year old male with severe osteoarthritis left  knee thought to be amendable to total knee replacement.   PREOPERATIVE DIAGNOSES:  Severe Osteoarthritis left knee.   POSTOPERATIVE DIAGNOSIS:  Severe Osteoarthritis left knee.   OPERATION:  Left total knee replacement (DePuy LCS cemented knee) with  large plus femur, size 5 tibia and 10 mm bearing with 3-peg patella.   SURGEON:  Dr. Margarito Liner. Caffrey.   ASSISTANT:  Legrand Pitts. Duffy, P.A.   TOURNIQUET TIME:  Approximately 1 hour and 10 minutes.   DESCRIPTION OF PROCEDURE:  Sterile prep and drape, exsanguination of the  leg, inflation of the tourniquet to 350. A straight skin incision with  medial parapatellar approach to the knee made with stripping of the  medial side due to a varus deformity, release of the PCL and posterior  capsule due to the flexion contracture. The tibia was cut provisionally  but then subsequent to that cut two subsequent times to get a flexion  gap measurement to about 10 mm. Anterior, posterior and femoral cuts was  made with followed by a 4 degree distal valgus cut.  There again was a  significant flexion contracture requiring release as well as some  moderate stripping of the medial side. The menisci were removed, PCL was  again released. The finishing guide was placed with resection of  posterior osteophytes off the femur.   Attention was next direct to the tibia, a keel hole was cut with the  tibia, prior to that sized with a 5 tibia.  Trial reduction was carried,  a three-peg patella was placed with patella thickness of about 16 mm  noted  with preoperatively 28, 29 mm thickness of the native patella. A  three-peg patella trial with all trials placed, two towel clips,  excellent stability, full extension.  No tendency to varus or valgus  instability, no excessive tightness in flexion with a slight posterior  drawer noted. Again the trial components were removed, final components  were inserted with a trial bearing placed. The trial bearing was  removed, tourniquet was released and no excess bleeding was noted  from the posterior aspect to the knee.  Small amounts of cement were  removed as well.  A final bearing was placed followed by again testing  the instability. Closure was effected on the cough capsule with Ethibond  with 2-0 Vicryl and skin closed with staples.  Marcaine with epinephrine  for the skin.      Dyke Brackett, M.D.  Electronically Signed     WDC/MEDQ  D:  10/19/2007  T:  10/19/2007  Job:  [id number]

## 2011-04-15 NOTE — Op Note (Signed)
NAME:  Ronald Bond, Ronald Bond              ACCOUNT NO.:  [id number]   MEDICAL RECORD NO.:  [id number]          PATIENT TYPE:  AMB   LOCATION:  ENDO                         FACILITY:  [facility]   PHYSICIAN:  Anselmo Rod, M.D.  DATE OF BIRTH:  [birth year]   DATE OF PROCEDURE:  02/12/2007  DATE OF DISCHARGE:                               OPERATIVE REPORT   PROCEDURE PERFORMED:  Colonoscopy with snare polypectomy times two.   ENDOSCOPIST:  Anselmo Rod.   INSTRUMENT USED:  Pentax video colonoscope.   INDICATION FOR PROCEDURE:  A 65 year old African-American male  undergoing a screening colonoscopy to rule out colonic polyps, masses,  etc.   PREPROCEDURE PREPARATION:  Informed consent was procured from the  patient.  The patient fasted for 4 hours prior to the procedure and  prepped with two Fleet Phospho-Soda bottles the morning of the  procedure.  Risks and benefits of the procedure including a 10% miss  rate of cancer and polyps were discussed with the patient as well.   PREPROCEDURE PHYSICAL:  The patient had stable vital signs.  NECK:  Supple.  CHEST:  Clear to auscultation.  S1, S2 regular.  ABDOMEN:  Soft with normal bowel sounds.   DESCRIPTION OF THE PROCEDURE:  The patient was placed in the left  lateral decubitus position, sedated with 100 mcg of Fentanyl and 10 mg  of Versed.  Once the patient was adequately sedated and maintained on  low flow oxygen and continuous cardiac monitoring, the Pentax video  colonoscope was advanced from the rectum to the cecum with difficulty.  The patient had a significant amount of residual stool in the dependent  areas of the colon. The patient's position was changed from the left  lateral to the supine position, with gentle application of abdominal  pressure to reach the cecal base.  The appendiceal orifice and ileocecal  valve were visualized after multiple washings, the terminal ileum could  not be visualized in spite of efforts to do so.   A small sessile polyp  was snared from the cecal base at lower settings (150/15).  Another  small sessile polyp was snared from the rectum by hot snare.  Small  internal hemorrhoids were seen on retroflexion.  There was no evidence  of diverticulosis.  As there was a significant amount of residual stool  in the colon, small lesions could be missed.  Multiple washings were  done, no large masses or polyps were seen.   IMPRESSION:  1. One small sessile polyp removed from the cecum and one from the      rectum.  2. Small internal hemorrhoids.  3. No evidence of diverticulosis.  4. A significant amount of residual stool in the colon, small lesions      could be missed. The patient did not consume the  OsmoPrep as      advised and called the doctor on call and was given Fleet Phospho-      Soda prep.   RECOMMENDATIONS:  1. Await pathology results.  2. Avoid all nonsteroidals, including aspirin, for the next 2 weeks.  3. Repeat  colonoscopy depending on culture results.  4. Outpatient followup as need arises in the future.      Anselmo Rod, M.D.  Electronically Signed     JNM/MEDQ  D:  02/12/2007  T:  02/12/2007  Job:  [id number]   cc:   Reuben Likes, M.D.

## 2011-04-15 NOTE — Discharge Summary (Signed)
Ronald Bond, Ronald Bond NO.:  [id number]   MEDICAL RECORD NO.:  [id number]          PATIENT TYPE:  INP   LOCATION:  5025                         FACILITY:  [facility]   PHYSICIAN:  Dyke Brackett, M.D.    DATE OF BIRTH:  [birth year]   DATE OF ADMISSION:  10/19/2007  DATE OF DISCHARGE:  10/24/2007                               DISCHARGE SUMMARY   ADMISSION DIAGNOSES:  1. End-stage osteoarthritis bilateral knees, left worse than right.  2. Hypertension.  3. Gastroesophageal reflux disease.  4. Hyperthyroidism with partial thyroidectomy.   DISCHARGE DIAGNOSES:  1. End-stage osteoarthritis bilateral knees, status post left total      knee arthroplasty.  2. Acute blood loss anemia secondary to surgery.  3. Acute renal insufficiency now resolved.  4. Constipation.  5. Pyrexia now resolved.  6. Hypokalemia now resolved.  7. Hypertension.  8. Gastroesophageal reflux disease.  9. History of hyperthyroidism status post partial thyroidectomy.   SURGICAL PROCEDURES:  On October 19, 2007 Ronald Bond underwent a left  total knee arthroplasty by Dr. Marcie Mowers assisted by Arnoldo Morale, PA-C.  He had a DePuy MBT keel tibial tray cemented size 5 placed  with an LCS complete RP insert, size large plus 10-mm thickness.  An LCS  complete primary femoral component cemented size large plus left and a  metal back patella cemented size large plus.   COMPLICATIONS:  None.   CONSULTANTS:  1. Physical therapy consult October 20, 2007.  2. Occupational therapy consult October 20, 2007.  3. Case management consult October 21, 2007.   HISTORY OF PRESENT ILLNESS:  This 65 year old black male patient  presented to Dr. Madelon Lips with a history of bilateral knee pain for many  years.  Now his left knee is more painful than the right and he is  having difficulty with activities of daily living.  Pain keeps him up at  night and with weightbearing and is moderately severe.  He has  failed  conservative treatment and because of that he is presenting for a left  knee replacement.   HOSPITAL COURSE:  Ronald Bond tolerated his surgical procedure well  without immediate postoperative complications.  He was transferred to  5000.  Postop day #1 T-max was 101, vitals were stable.  Leg was  neurovascularly intact.  BUN and creatinine were a bit elevated, and he  was hydrated.  He was started on therapy per protocol.   On postop day #2 T max 101.9, vitals stable.  Hemoglobin 9, hematocrit  26.9, BUN 22, creatinine 2.56.  Dressing was changed, leg was  neurovascularly intact.  Hydrochlorothiazide and lisinopril were held.  He was switched to p.o. pain meds and continued on IV fluids.  He did  have difficulty with a temperature that night, and pulmonary toilet was  encouraged.  T-max had been 102.1.  On November 24 his hemoglobin 9,  hematocrit 26.9, BUN 16, creatinine 2.03.  Legs were neurovascularly  intact.  He was continued on therapy.  Blood pressure meds were held.  He was given saline boluses.  Constipation was treated with a  laxative.   On November 25 laxatives were effective.  He had a bowel movement.  T-  max 101.4, hemoglobin 8.8, hematocrit 26.2.  Potassium had dropped to  3.4.  BUN and creatinine were improving.  He was supplemented with  potassium.  Chest x-ray was obtained to rule out respiratory source of  fever.   On November 26 T-max was 100.5, vitals stable.  Hemoglobin 8.5,  hematocrit 24.8.  Chest x-ray the day before had showed mild  peribronchial thickening but no active disease.  He was started on iron  because he was asymptomatic with that hemoglobin.  He was told to  monitor his temperature and was able to be discharged home later that  day.   DISCHARGE INSTRUCTIONS:  Diet:  He is to resume his regular  prehospitalization diet.   MEDICATIONS:  He is to resume his home meds as follows:  1. Sustain eye drops 1 drop 4 times a day.  2. K-Dur 20  mEq p.o. q.a.m.  3. Calcionate complete 2 tablets p.o. q.a.m.  4. Aspirin 81 mg he is to hold until he has completed his Lovenox.  5. Levothyroxine 150 mg p.o. q.a.m.  6. Indocin 75 mg p.o. q.a.m. He is to hold at this time.  7. Amlodipine 10 mg p.o. q.a.m.  8. Omeprazole 20 mg p.o. q.a.m.  9. Joint relief formula 3 tablets p.o. q.a.m.  10.Saw palmetto chew tablets p.o. q.a.m. He is to hold at this time.  11.Pravastatin 40 mg p.o. nightly.  12.Centrum 1 tablet p.o. q.a.m.  13.Lisinopril and hydrochlorothiazide 20/12.5 p.o. q.a.m.  14.Prilosec 20 mg p.o. b.i.d.  15.Advair inhaler p.r.n.  16.Albuterol inhaler p.r.n.   ADDITIONAL MEDICATIONS:  At this time include:  1. Lovenox 40 mg subcu q. 8 a.m. with last dose to be on December 5.      He is to resume his aspirin on December 6, 9 were given.  2. Percocet 5/325 1-2 tablets p.o. q.4h. p.r.n. for pain, 60 with no      refill.  3. Robaxin 500 mg 1-2 tablets p.o. q.6h. p.r.n. for spasms, 50 with no      refill.   He is encouraged to take an iron supplement twice a day for 1 month.   ACTIVITY:  He can be weightbearing as tolerated on the left leg with use  of the walker.  He is to have home health PT per [facility].  He is to increase his activity slowly and no lifting or driving  for 6 weeks.  Please see the blue total knee discharge sheet for further  activity instructions.   WOUND CARE:  He may shower after no drainage from the wound for 2 days.  Please see the blue total knee discharge sheet for further wound care  instructions.   He is to have home CPM 0 to 90 degrees 6-8 hours a day.   FOLLOWUP:  He is to follow up with Dr. Sherlean Foot in our office on Thursday,  December 4 and needs to call [phone] for that appointment.   LABORATORY DATA:  Chest x-ray: Done on November 19 showed no acute  findings.   Hemoglobin/hematocrit ranged from 14 and 41.5 on the 19th to a low of  8.5 and 24.8 on the 26th.  White count  ranged from 7.7 on the 19th to  10.7 on the 24th to 8.8 on the 26th.  Platelets remained within normal  limits.  Sodium went from 136 on the 19th  to 132 on the 22nd and 23rd to  57 on the 25th.  Potassium went from 4 on the 19th to 3.4 on the 25th.  Glucose ranged from 94 on the 19th to 123 on the 25th.  BUN and  creatinine went from 12 and 1.25 on the 19th, to 29 and 3.5 on the 22nd,  to 14 and 1.91 on the 26th.  Estimated GFR went from 59 on the 19th, to  20 on the 22nd, to 84 on the 22nd, to 27 on the 24th, to 36 on the 25th.  All other laboratory studies were within normal limits.      Legrand Pitts Duffy, Arnetha Courser, M.D.  Electronically Signed    KED/MEDQ  D:  12/17/2007  T:  12/17/2007  Job:  [id number]

## 2011-05-20 ENCOUNTER — Other Ambulatory Visit: Payer: Self-pay | Admitting: Internal Medicine

## 2011-05-30 ENCOUNTER — Ambulatory Visit (INDEPENDENT_AMBULATORY_CARE_PROVIDER_SITE_OTHER): Payer: Medicare Other | Admitting: Internal Medicine

## 2011-05-30 ENCOUNTER — Encounter: Payer: Self-pay | Admitting: Internal Medicine

## 2011-05-30 DIAGNOSIS — R519 Headache, unspecified: Secondary | ICD-10-CM | POA: Insufficient documentation

## 2011-05-30 DIAGNOSIS — I10 Essential (primary) hypertension: Secondary | ICD-10-CM

## 2011-05-30 DIAGNOSIS — R51 Headache: Secondary | ICD-10-CM

## 2011-05-30 MED ORDER — AMLODIPINE-VALSARTAN-HCTZ 10-320-25 MG PO TABS: 1.0000 | ORAL_TABLET | Freq: Every day | ORAL | Status: DC

## 2011-05-30 NOTE — Assessment & Plan Note (Signed)
Mild, benign neuro exam - Treat uncontrolled BP as above, pt to call if unimproved or worse

## 2011-05-30 NOTE — Patient Instructions (Signed)
It was good to see you today. "double" the dose of your exforge hct - 2 week samples of new dose given to you - if good control, call and we will send prescription If unimproved blood pressure or headache in 2 weeks, call for recheck with Dr. Yetta Barre as discussed

## 2011-05-30 NOTE — Progress Notes (Signed)
  Subjective:    Patient ID: Ronald Bond, male    DOB: 05-Oct-1946, 65 y.o.   MRN: [id number]  HPI complains of mild headache Onset 3 days ago symptoms intermittent - no headache at this time Located behind eyes - no sinus pain, no fever. No vision change No trauma or injury associated with increase BP last 3 days - taking meds as rx'd No chest pain, shortness of breath or edema  Past Medical History  Diagnosis Date  . GERD (gastroesophageal reflux disease)   . Hyperlipidemia   . HTN (hypertension)   . Hypothyroidism   . Osteoarthritis     Review of Systems  Constitutional: Negative for fatigue.  Eyes: Negative for photophobia, pain and visual disturbance.  Respiratory: Negative for cough.   Neurological: Negative for dizziness, tremors, seizures, weakness, light-headedness and numbness.       Objective:   Physical Exam BP 162/90  Pulse 56  Temp(Src) 98.6 F (37 C) (Oral)  Ht 5\' 9"  (1.753 m)  Wt 214 lb 6.4 oz (97.251 kg)  BMI 31.66 kg/m2  SpO2 97% BP Readings from Last 3 Encounters:  05/30/11 162/90  03/10/11 128/76  02/24/11 136/78   Physical Exam  Constitutional:  oriented to person, place, and time. appears well-developed and well-nourished. No distress.  Neck: Normal range of motion. Neck supple. No JVD present. No thyromegaly present.  Cardiovascular: Normal rate, regular rhythm and normal heart sounds.  No murmur heard. no BLE edema Pulmonary/Chest: Effort normal and breath sounds normal. No respiratory distress. no wheezes.  Neurological: he is alert and oriented to person, place, and time. No cranial nerve deficit. Coordination normal.  Psychiatric: he has a normal mood and affect. behavior is normal. Judgment and thought content normal.   Lab Results  Component Value Date   WBC 7.9 02/24/2011   HGB 13.2 02/24/2011   HCT 38.5* 02/24/2011   PLT 261.0 02/24/2011   CHOL 231* 05/28/2009   TRIG 85.0 05/28/2009   HDL 62.20 05/28/2009   LDLDIRECT 139.2 05/28/2009     ALT 23 02/24/2011   AST 19 02/24/2011   NA 141 02/24/2011   K 3.7 02/24/2011   CL 105 02/24/2011   CREATININE 1.3 02/24/2011   BUN 16 02/24/2011   CO2 29 02/24/2011   TSH 0.13* 02/24/2011   PSA 1.69 02/24/2011       Assessment & Plan:  See problem list. Medications and labs reviewed today.

## 2011-05-30 NOTE — Assessment & Plan Note (Signed)
Uncontrolled today - will increase current meds - samples given x 2 weeks Pt will monitor at home and call if unimproved or problems with new med before new rx done BP Readings from Last 3 Encounters:  05/30/11 162/90  03/10/11 128/76  02/24/11 136/78

## 2011-06-24 ENCOUNTER — Telehealth: Payer: Self-pay

## 2011-06-24 MED ORDER — AMLODIPINE-VALSARTAN-HCTZ 10-320-25 MG PO TABS: 1.0000 | ORAL_TABLET | Freq: Every day | ORAL | Status: DC

## 2011-06-24 NOTE — Telephone Encounter (Signed)
Patient called [facility] stating exforge was increased to 10/320/25. He is requesting rx to be sent to [facility]

## 2011-09-06 LAB — BASIC METABOLIC PANEL
BUN: 14
BUN: 16
BUN: 22
BUN: 24 — ABNORMAL HIGH
BUN: 29 — ABNORMAL HIGH
CO2: 24
CO2: 24
CO2: 26
CO2: 27
CO2: 28
Calcium: 7.8 — ABNORMAL LOW
Calcium: 7.9 — ABNORMAL LOW
Calcium: 8.3 — ABNORMAL LOW
Calcium: 8.8
Calcium: 8.9
Chloride: 100
Chloride: 100
Chloride: 101
Chloride: 101
Chloride: 98
Creatinine, Ser: 1.91 — ABNORMAL HIGH
Creatinine, Ser: 2.03 — ABNORMAL HIGH
Creatinine, Ser: 2.56 — ABNORMAL HIGH
Creatinine, Ser: 3.2 — ABNORMAL HIGH
Creatinine, Ser: 3.5 — ABNORMAL HIGH
GFR calc Af Amer: 22 — ABNORMAL LOW
GFR calc Af Amer: 24 — ABNORMAL LOW
GFR calc Af Amer: 31 — ABNORMAL LOW
GFR calc Af Amer: 41 — ABNORMAL LOW
GFR calc Af Amer: 44 — ABNORMAL LOW
GFR calc non Af Amer: 18 — ABNORMAL LOW
GFR calc non Af Amer: 20 — ABNORMAL LOW
GFR calc non Af Amer: 26 — ABNORMAL LOW
GFR calc non Af Amer: 34 — ABNORMAL LOW
GFR calc non Af Amer: 36 — ABNORMAL LOW
Glucose, Bld: 102 — ABNORMAL HIGH
Glucose, Bld: 115 — ABNORMAL HIGH
Glucose, Bld: 116 — ABNORMAL HIGH
Glucose, Bld: 117 — ABNORMAL HIGH
Glucose, Bld: 123 — ABNORMAL HIGH
Potassium: 3.4 — ABNORMAL LOW
Potassium: 3.6
Potassium: 3.6
Potassium: 3.8
Potassium: 4.3
Sodium: 132 — ABNORMAL LOW
Sodium: 132 — ABNORMAL LOW
Sodium: 133 — ABNORMAL LOW
Sodium: 134 — ABNORMAL LOW
Sodium: 137

## 2011-09-06 LAB — DIFFERENTIAL
Basophils Absolute: 0.1
Basophils Relative: 1
Eosinophils Absolute: 0.3
Eosinophils Relative: 4
Lymphocytes Relative: 20
Lymphs Abs: 1.6
Monocytes Absolute: 0.8
Monocytes Relative: 10
Neutro Abs: 5
Neutrophils Relative %: 65

## 2011-09-06 LAB — URINALYSIS, ROUTINE W REFLEX MICROSCOPIC
Bilirubin Urine: NEGATIVE
Glucose, UA: NEGATIVE
Hgb urine dipstick: NEGATIVE
Ketones, ur: NEGATIVE
Nitrite: NEGATIVE
Protein, ur: NEGATIVE
Specific Gravity, Urine: 1.015
Urobilinogen, UA: 0.2
pH: 7

## 2011-09-06 LAB — CBC
HCT: 24.8 — ABNORMAL LOW
HCT: 26.2 — ABNORMAL LOW
HCT: 26.9 — ABNORMAL LOW
HCT: 26.9 — ABNORMAL LOW
HCT: 29.1 — ABNORMAL LOW
HCT: 41.5
Hemoglobin: 14
Hemoglobin: 8.5 — ABNORMAL LOW
Hemoglobin: 8.8 — ABNORMAL LOW
Hemoglobin: 9 — ABNORMAL LOW
Hemoglobin: 9 — ABNORMAL LOW
Hemoglobin: 9.6 — ABNORMAL LOW
MCHC: 33.2
MCHC: 33.4
MCHC: 33.5
MCHC: 33.6
MCHC: 33.8
MCHC: 34.4
MCV: 88.5
MCV: 89.7
MCV: 89.7
MCV: 90
MCV: 91
MCV: 91.1
Platelets: 180
Platelets: 201
Platelets: 203
Platelets: 239
Platelets: 294
Platelets: 318
RBC: 2.8 — ABNORMAL LOW
RBC: 2.92 — ABNORMAL LOW
RBC: 2.96 — ABNORMAL LOW
RBC: 2.99 — ABNORMAL LOW
RBC: 3.2 — ABNORMAL LOW
RBC: 4.61
RDW: 13.5
RDW: 13.5
RDW: 13.8
RDW: 13.8
RDW: 13.9
RDW: 14.1
WBC: 10.2
WBC: 10.7 — ABNORMAL HIGH
WBC: 7.7
WBC: 8.8
WBC: 9
WBC: 9.9

## 2011-09-06 LAB — TYPE AND SCREEN
ABO/RH(D): A POS
Antibody Screen: NEGATIVE

## 2011-09-06 LAB — COMPREHENSIVE METABOLIC PANEL
ALT: 15
AST: 17
Albumin: 4.2
Alkaline Phosphatase: 78
BUN: 12
CO2: 27
Calcium: 9.7
Chloride: 100
Creatinine, Ser: 1.25
GFR calc Af Amer: >60
GFR calc non Af Amer: 59 — ABNORMAL LOW
Glucose, Bld: 94
Potassium: 4
Sodium: 136
Total Bilirubin: 1
Total Protein: 7.8

## 2011-09-06 LAB — POTASSIUM: Potassium: 3.6

## 2011-09-06 LAB — URINE CULTURE
Colony Count: NO GROWTH
Culture: NO GROWTH

## 2011-09-06 LAB — APTT: aPTT: 26

## 2011-09-06 LAB — PREPARE RBC (CROSSMATCH)

## 2011-09-06 LAB — HEMOGLOBIN AND HEMATOCRIT, BLOOD
HCT: 29.2 — ABNORMAL LOW
Hemoglobin: 10 — ABNORMAL LOW

## 2011-09-06 LAB — PROTIME-INR
INR: 0.9
Prothrombin Time: 12.6

## 2011-09-06 LAB — ABO/RH: ABO/RH(D): A POS

## 2011-10-24 ENCOUNTER — Ambulatory Visit (INDEPENDENT_AMBULATORY_CARE_PROVIDER_SITE_OTHER)
Admission: RE | Admit: 2011-10-24 | Discharge: 2011-10-24 | Disposition: A | Discharge status: Home / Self Care | Payer: Medicare Other | Source: Ambulatory Visit | Attending: Internal Medicine | Admitting: Internal Medicine

## 2011-10-24 ENCOUNTER — Other Ambulatory Visit: Payer: Self-pay | Admitting: Internal Medicine

## 2011-10-24 ENCOUNTER — Ambulatory Visit (INDEPENDENT_AMBULATORY_CARE_PROVIDER_SITE_OTHER): Payer: Medicare Other | Admitting: Internal Medicine

## 2011-10-24 ENCOUNTER — Encounter: Payer: Self-pay | Admitting: Internal Medicine

## 2011-10-24 ENCOUNTER — Other Ambulatory Visit (INDEPENDENT_AMBULATORY_CARE_PROVIDER_SITE_OTHER): Payer: Medicare Other

## 2011-10-24 VITALS — BP 144/82 | HR 54 | Temp 98.2°F | Resp 16 | Wt 209.0 lb

## 2011-10-24 DIAGNOSIS — R05 Cough: Secondary | ICD-10-CM

## 2011-10-24 DIAGNOSIS — E039 Hypothyroidism, unspecified: Secondary | ICD-10-CM

## 2011-10-24 DIAGNOSIS — Z23 Encounter for immunization: Secondary | ICD-10-CM

## 2011-10-24 DIAGNOSIS — N401 Enlarged prostate with lower urinary tract symptoms: Secondary | ICD-10-CM

## 2011-10-24 DIAGNOSIS — I10 Essential (primary) hypertension: Secondary | ICD-10-CM

## 2011-10-24 DIAGNOSIS — R059 Cough, unspecified: Secondary | ICD-10-CM

## 2011-10-24 DIAGNOSIS — E785 Hyperlipidemia, unspecified: Secondary | ICD-10-CM

## 2011-10-24 DIAGNOSIS — N138 Other obstructive and reflux uropathy: Secondary | ICD-10-CM

## 2011-10-24 DIAGNOSIS — J209 Acute bronchitis, unspecified: Secondary | ICD-10-CM

## 2011-10-24 LAB — CBC WITH DIFFERENTIAL/PLATELET
Basophils Relative: 0.6 % (ref 0.0–3.0)
Eosinophils Absolute: 0.3 10*3/uL (ref 0.0–0.7)
Eosinophils Relative: 2.6 % (ref 0.0–5.0)
Hemoglobin: 14.2 g/dL (ref 13.0–17.0)
MCHC: 33.4 g/dL (ref 30.0–36.0)
MCV: 94 fl (ref 78.0–100.0)
Monocytes Absolute: 0.7 10*3/uL (ref 0.1–1.0)
Neutro Abs: 8.5 10*3/uL — ABNORMAL HIGH (ref 1.4–7.7)
RBC: 4.53 Mil/uL (ref 4.22–5.81)
WBC: 11.6 10*3/uL — ABNORMAL HIGH (ref 4.5–10.5)

## 2011-10-24 LAB — TSH: TSH: 40.25 u[IU]/mL — ABNORMAL HIGH (ref 0.35–5.50)

## 2011-10-24 LAB — URINALYSIS, ROUTINE W REFLEX MICROSCOPIC
Bilirubin Urine: NEGATIVE
Hgb urine dipstick: NEGATIVE
Leukocytes, UA: NEGATIVE
Nitrite: NEGATIVE
Urobilinogen, UA: 0.2 (ref 0.0–1.0)

## 2011-10-24 LAB — COMPREHENSIVE METABOLIC PANEL
AST: 26 U/L (ref 0–37)
Albumin: 4.5 g/dL (ref 3.5–5.2)
Alkaline Phosphatase: 59 U/L (ref 39–117)
BUN: 19 mg/dL (ref 6–23)
Calcium: 9.7 mg/dL (ref 8.4–10.5)
Chloride: 101 mEq/L (ref 96–112)
Creatinine, Ser: 1.5 mg/dL (ref 0.4–1.5)
Glucose, Bld: 90 mg/dL (ref 70–99)
Potassium: 3.8 mEq/L (ref 3.5–5.1)

## 2011-10-24 MED ORDER — AZITHROMYCIN 500 MG PO TABS: 500.0000 mg | ORAL_TABLET | Freq: Every day | ORAL | Status: AC

## 2011-10-24 MED ORDER — GUAIFENESIN-CODEINE 100-10 MG/5ML PO SYRP: 5.0000 mL | ORAL_SOLUTION | Freq: Three times a day (TID) | ORAL | Status: AC | PRN

## 2011-10-24 MED ORDER — LEVOTHYROXINE SODIUM 150 MCG PO TABS: 150.0000 ug | ORAL_TABLET | Freq: Every day | ORAL | Status: DC

## 2011-10-24 MED ORDER — DUTASTERIDE-TAMSULOSIN HCL 0.5-0.4 MG PO CAPS: 1.0000 | ORAL_CAPSULE | Freq: Every day | ORAL | Status: DC

## 2011-10-24 NOTE — Assessment & Plan Note (Signed)
He is doing well on pravachol, I will check his labs today 

## 2011-10-24 NOTE — Progress Notes (Signed)
Addended by: [name] on: 10/24/2011 03:30 PM   Modules accepted: Orders

## 2011-10-24 NOTE — Assessment & Plan Note (Signed)
His BP is well controlled, I will check his lytes today 

## 2011-10-24 NOTE — Assessment & Plan Note (Signed)
I will check his TSH today 

## 2011-10-24 NOTE — Assessment & Plan Note (Signed)
He will get a CXR done to look for pna, mass, edema

## 2011-10-24 NOTE — Progress Notes (Signed)
Subjective:    Patient ID: Ronald Bond, male    DOB: 1946-06-11, 65 y.o.   MRN: [id number]  Cough This is a new problem. Episode onset: 10 days ago. The problem has been gradually worsening. The problem occurs every few hours. The cough is productive of purulent sputum. Associated symptoms include chills and sweats. Pertinent negatives include no chest pain, ear congestion, ear pain, fever, headaches, heartburn, myalgias, nasal congestion, postnasal drip, rash, rhinorrhea, sore throat, shortness of breath, weight loss or wheezing. The symptoms are aggravated by nothing. Ronald Bond has tried nothing for the symptoms.  Thyroid Problem Presents for follow-up visit. Patient reports no anxiety, cold intolerance, constipation, depressed mood, diaphoresis, diarrhea, dry skin, fatigue, hair loss, heat intolerance, hoarse voice, leg swelling, nail problem, palpitations, tremors, visual change, weight gain or weight loss. The symptoms have been stable.      Review of Systems  Constitutional: Positive for chills. Negative for fever, weight loss, weight gain, diaphoresis, activity change, appetite change, fatigue and unexpected weight change.  HENT: Positive for congestion. Negative for hearing loss, ear pain, nosebleeds, sore throat, hoarse voice, facial swelling, rhinorrhea, sneezing, drooling, trouble swallowing, neck pain, voice change, postnasal drip, sinus pressure and tinnitus.   Eyes: Negative.   Respiratory: Positive for cough. Negative for shortness of breath, wheezing and stridor.   Cardiovascular: Negative for chest pain, palpitations and leg swelling.  Gastrointestinal: Negative for heartburn, nausea, vomiting, abdominal pain, diarrhea, constipation and blood in stool.  Genitourinary: Positive for frequency and difficulty urinating (urine flow is weak, Ronald Bond has dribbling and frequency). Negative for dysuria, urgency, hematuria, flank pain, decreased urine volume, discharge, penile swelling, scrotal  swelling, enuresis, penile pain and testicular pain.  Musculoskeletal: Negative for myalgias, back pain, joint swelling, arthralgias and gait problem.  Skin: Negative for color change, pallor, rash and wound.  Neurological: Negative for dizziness, tremors, seizures, syncope, facial asymmetry, speech difficulty, weakness, light-headedness, numbness and headaches.  Hematological: Negative for cold intolerance, heat intolerance and adenopathy. Does not bruise/bleed easily.  Psychiatric/Behavioral: Negative.        Objective:   Physical Exam  Vitals reviewed. Constitutional: Ronald Bond is oriented to person, place, and time. Ronald Bond appears well-developed and well-nourished. No distress.  HENT:  Head: Normocephalic and atraumatic.  Mouth/Throat: Oropharynx is clear and moist. No oropharyngeal exudate.  Eyes: Conjunctivae are normal. Right eye exhibits no discharge. Left eye exhibits no discharge. No scleral icterus.  Neck: Normal range of motion. Neck supple. No JVD present. No tracheal deviation present. No thyromegaly present.  Cardiovascular: Normal rate, regular rhythm, normal heart sounds and intact distal pulses.  Exam reveals no gallop and no friction rub.   No murmur heard. Pulmonary/Chest: Effort normal and breath sounds normal. No stridor. No respiratory distress. Ronald Bond has no wheezes. Ronald Bond has no rales. Ronald Bond exhibits no tenderness.  Abdominal: Soft. Bowel sounds are normal. Ronald Bond exhibits no distension. There is no tenderness. There is no rebound and no guarding. Hernia confirmed negative in the right inguinal area and confirmed negative in the left inguinal area.  Genitourinary: Rectum normal, prostate normal, testes normal and penis normal. Rectal exam shows no external hemorrhoid, no internal hemorrhoid, no fissure, no mass, no tenderness and anal tone normal. Guaiac negative stool. Prostate is not enlarged and not tender. Right testis shows no mass, no swelling and no tenderness. Right testis is descended.  Left testis shows no mass, no swelling and no tenderness. Left testis is descended. Uncircumcised. No phimosis, paraphimosis, hypospadias, penile erythema or penile tenderness.  No discharge found.  Musculoskeletal: Normal range of motion. Ronald Bond exhibits no edema and no tenderness.  Lymphadenopathy:    Ronald Bond has no cervical adenopathy.       Right: No inguinal adenopathy present.       Left: No inguinal adenopathy present.  Neurological: Ronald Bond is oriented to person, place, and time.  Skin: Skin is warm and dry. No rash noted. Ronald Bond is not diaphoretic. No erythema. No pallor.  Psychiatric: Ronald Bond has a normal mood and affect. His behavior is normal. Judgment and thought content normal.     Lab Results  Component Value Date   WBC 7.9 02/24/2011   HGB 13.2 02/24/2011   HCT 38.5* 02/24/2011   PLT 261.0 02/24/2011   GLUCOSE 93 02/24/2011   CHOL 231* 05/28/2009   TRIG 85.0 05/28/2009   HDL 62.20 05/28/2009   LDLDIRECT 139.2 05/28/2009   ALT 23 02/24/2011   AST 19 02/24/2011   NA 141 02/24/2011   K 3.7 02/24/2011   CL 105 02/24/2011   CREATININE 1.3 02/24/2011   BUN 16 02/24/2011   CO2 29 02/24/2011   TSH 0.13* 02/24/2011   PSA 1.69 02/24/2011   INR 0.9 10/17/2007       Assessment & Plan:

## 2011-10-24 NOTE — Assessment & Plan Note (Signed)
Start a cough suppressant for the symptoms and zpak for the infection

## 2011-10-24 NOTE — Assessment & Plan Note (Signed)
He has not been compliant with his BPH meds for 8 eight months and is now having a recurrence of symptoms so I gave him samples of Jalyn to treat the BPH, today I will check a UA to look for blood/infection and will get a PSA done as well

## 2011-10-24 NOTE — Patient Instructions (Signed)

## 2011-12-09 ENCOUNTER — Other Ambulatory Visit: Payer: Self-pay | Admitting: Internal Medicine

## 2012-02-10 ENCOUNTER — Ambulatory Visit (INDEPENDENT_AMBULATORY_CARE_PROVIDER_SITE_OTHER): Payer: Medicare Other | Admitting: Internal Medicine

## 2012-02-10 ENCOUNTER — Encounter: Payer: Self-pay | Admitting: Internal Medicine

## 2012-02-10 ENCOUNTER — Other Ambulatory Visit (INDEPENDENT_AMBULATORY_CARE_PROVIDER_SITE_OTHER): Payer: Medicare Other

## 2012-02-10 VITALS — BP 128/80 | HR 70 | Temp 97.6°F | Resp 16 | Wt 219.0 lb

## 2012-02-10 DIAGNOSIS — I10 Essential (primary) hypertension: Secondary | ICD-10-CM

## 2012-02-10 DIAGNOSIS — E039 Hypothyroidism, unspecified: Secondary | ICD-10-CM

## 2012-02-10 DIAGNOSIS — E785 Hyperlipidemia, unspecified: Secondary | ICD-10-CM

## 2012-02-10 DIAGNOSIS — D72829 Elevated white blood cell count, unspecified: Secondary | ICD-10-CM

## 2012-02-10 DIAGNOSIS — B353 Tinea pedis: Secondary | ICD-10-CM

## 2012-02-10 DIAGNOSIS — N401 Enlarged prostate with lower urinary tract symptoms: Secondary | ICD-10-CM

## 2012-02-10 LAB — COMPREHENSIVE METABOLIC PANEL
AST: 22 U/L (ref 0–37)
Albumin: 4.3 g/dL (ref 3.5–5.2)
Alkaline Phosphatase: 53 U/L (ref 39–117)
Glucose, Bld: 103 mg/dL — ABNORMAL HIGH (ref 70–99)
Potassium: 3.2 mEq/L — ABNORMAL LOW (ref 3.5–5.1)
Sodium: 139 mEq/L (ref 135–145)
Total Protein: 8 g/dL (ref 6.0–8.3)

## 2012-02-10 LAB — CBC WITH DIFFERENTIAL/PLATELET
Eosinophils Relative: 2.7 % (ref 0.0–5.0)
Lymphocytes Relative: 16.7 % (ref 12.0–46.0)
MCV: 92.1 fl (ref 78.0–100.0)
Monocytes Absolute: 0.8 10*3/uL (ref 0.1–1.0)
Monocytes Relative: 9.2 % (ref 3.0–12.0)
Neutrophils Relative %: 70.7 % (ref 43.0–77.0)
Platelets: 255 10*3/uL (ref 150.0–400.0)
WBC: 8.7 10*3/uL (ref 4.5–10.5)

## 2012-02-10 LAB — TSH: TSH: 10.82 u[IU]/mL — ABNORMAL HIGH (ref 0.35–5.50)

## 2012-02-10 LAB — LIPID PANEL
LDL Cholesterol: 116 mg/dL — ABNORMAL HIGH (ref 0–99)
VLDL: 13.6 mg/dL (ref 0.0–40.0)

## 2012-02-10 MED ORDER — ROSUVASTATIN CALCIUM 20 MG PO TABS: 20.0000 mg | ORAL_TABLET | Freq: Every day | ORAL | Status: DC

## 2012-02-10 MED ORDER — CLOTRIMAZOLE-BETAMETHASONE 1-0.05 % EX CREA: TOPICAL_CREAM | Freq: Two times a day (BID) | CUTANEOUS | Status: DC

## 2012-02-10 MED ORDER — LEVOTHYROXINE SODIUM 200 MCG PO TABS: 200.0000 ug | ORAL_TABLET | Freq: Every day | ORAL | Status: DC

## 2012-02-10 NOTE — Assessment & Plan Note (Signed)
I will recheck his CBC today and will look at his SPEP as well to screen for lymphoproliferative disease

## 2012-02-10 NOTE — Assessment & Plan Note (Signed)
TSH is high so I increased his synthroid dose to 200 mcg per day

## 2012-02-10 NOTE — Assessment & Plan Note (Signed)
His BP is well controlled, I will check his lytes and renal function today 

## 2012-02-10 NOTE — Assessment & Plan Note (Signed)
His LDL is too high so I have asked him to stop the pravachol and start crestor for better LDL reduction and to improve risk reduction

## 2012-02-10 NOTE — Patient Instructions (Signed)
Hypercholesterolemia High Blood Cholesterol Cholesterol is a white, waxy, fat-like protein needed by your body in small amounts. The liver makes all the cholesterol you need. It is carried from the liver by the blood through the blood vessels. Deposits (plaque) may build up on blood vessel walls. This makes the arteries narrower and stiffer. Plaque increases the risk for heart attack and stroke. You cannot feel your cholesterol level even if it is very high. The only way to know is by a blood test to check your lipid (fats) levels. Once you know your cholesterol levels, you should keep a record of the test results. Work with your caregiver to to keep your levels in the desired range. WHAT THE RESULTS MEAN:  Total cholesterol is a rough measure of all the cholesterol in your blood.   LDL is the so-called bad cholesterol. This is the type that deposits cholesterol in the walls of the arteries. You want this level to be low.   HDL is the good cholesterol because it cleans the arteries and carries the LDL away. You want this level to be high.   Triglycerides are fat that the body can either burn for energy or store. High levels are closely linked to heart disease.  DESIRED LEVELS:  Total cholesterol below 200.   LDL below 100 for people at risk, below 70 for very high risk.   HDL above 50 is good, above 60 is best.   Triglycerides below 150.  HOW TO LOWER YOUR CHOLESTEROL:  Diet.   Choose fish or white meat chicken and [place], roasted or baked. Limit fatty cuts of red meat, fried foods, and processed meats, such as sausage and lunch meat.   Eat lots of fresh fruits and vegetables. Choose whole grains, beans, pasta, potatoes and cereals.   Use only small amounts of olive, corn or canola oils. Avoid butter, mayonnaise, shortening or palm kernel oils. Avoid foods with trans-fats.   Use skim/nonfat milk and low-fat/nonfat yogurt and cheeses. Avoid whole milk, cream, ice cream, egg yolks and  cheeses. Healthy desserts include angel food cake, gingersnaps, animal crackers, hard candy, popsicles, and low-fat/nonfat frozen yogurt. Avoid pastries, cakes, pies and cookies.   Exercise.   A regular program helps decrease LDL and raises HDL.   Helps with weight control.   Do things that increase your activity level like gardening, walking, or taking the stairs.   Medication.   May be prescribed by your caregiver to help lowering cholesterol and the risk for heart disease.   You may need medicine even if your levels are normal if you have several risk factors.  HOME CARE INSTRUCTIONS   Follow your diet and exercise programs as suggested by your caregiver.   Take medications as directed.   Have blood work done when your caregiver feels it is necessary.  MAKE SURE YOU:   Understand these instructions.   Will watch your condition.   Will get help right away if you are not doing well or get worse.  Document Released: 11/14/2005 Document Revised: 11/03/2011 Document Reviewed: 05/02/2007 [facility] Patient Information 2012 [place], [place].Athlete's Foot Your exam shows you have athlete's foot, a fungus infection that usually starts between the toes. This condition is made worse by heat, moisture, and friction. To treat it you should wash your feet 2-3 times daily, drying well especially between the toes. Wear cotton socks to absorb sweat and change them twice a day if possible. You should allow your feet to be open to the  air as much as possible and wear sandals when possible. The first step in treatment is prevention:  Do not share towels.   Wear sandals or other foot protection in wet areas such as locker rooms, shared showers and public swimming pools.   Keep your feet dry. Wear shoes that allow air to circulate and use cotton or wool socks.  Many products are available to treat athlete's foot. Most are medications that kill fungus and are available as powders or creams. Some  require a prescription, some do not. Your caregiver or pharmacist can make a suggestion. Do not use steroid creams on athlete's foot. SEEK MEDICAL CARE IF:   You develop a temperature with no other apparent cause.   You develop swelling and soreness & redness (inflammation) in your foot.   The infection is spreading. Your foot becomes swollen, hot and red (inflamed).   Treatment is not helping.   Athlete's foot is not better in 7 days or completely cured in 30 days.   You have any problems that may be related to the medicine you are taking.  Document Released: 12/22/2004 Document Revised: 07/27/2011 Document Reviewed: 09/29/2009 [facility] Patient Information 2012 [place], [place].

## 2012-02-10 NOTE — Progress Notes (Signed)
Subjective:    Patient ID: Ronald Bond, male    DOB: 1946/09/05, 66 y.o.   MRN: [id number]  Benign Prostatic Hypertrophy This is a chronic problem. The current episode started more than 1 year ago. The problem has been gradually worsening since onset. Irritative symptoms include frequency, nocturia and urgency. Obstructive symptoms include incomplete emptying and a weak stream. Obstructive symptoms do not include dribbling, an intermittent stream, a slower stream or straining. Pertinent negatives include no chills, dysuria, genital pain, hematuria, hesitancy, nausea or vomiting. AUA score is 8-19. His sexual activity is non-contributory to the current illness. The symptoms are aggravated by nothing. Past treatments include tamsulosin and dutasteride. The treatment provided mild relief. He has been using treatment for 6 to 12 months.  Rash This is a new problem. The current episode started 1 to 4 weeks ago. The problem is unchanged. The affected locations include the right foot and left foot. The rash is characterized by dryness, scaling and itchiness. He was exposed to nothing. Pertinent negatives include no anorexia, congestion, cough, diarrhea, eye pain, facial edema, fatigue, fever, joint pain, nail changes, rhinorrhea, shortness of breath, sore throat or vomiting. Past treatments include nothing.      Review of Systems  Constitutional: Negative for fever, chills, diaphoresis, activity change, appetite change, fatigue and unexpected weight change.  HENT: Negative.  Negative for congestion, sore throat and rhinorrhea.   Eyes: Negative.  Negative for pain.  Respiratory: Negative for apnea, cough, choking, chest tightness, shortness of breath, wheezing and stridor.   Cardiovascular: Negative for chest pain, palpitations and leg swelling.  Gastrointestinal: Negative for nausea, vomiting, abdominal pain, diarrhea, constipation, blood in stool, abdominal distention, anal bleeding, rectal pain and  anorexia.  Genitourinary: Positive for urgency, frequency, incomplete emptying and nocturia. Negative for dysuria, hesitancy, hematuria, flank pain, decreased urine volume, enuresis and difficulty urinating.  Musculoskeletal: Negative for myalgias, back pain, joint pain, joint swelling, arthralgias and gait problem.  Skin: Positive for rash. Negative for nail changes, color change, pallor and wound.  Neurological: Negative for dizziness, tremors, seizures, syncope, facial asymmetry, speech difficulty, weakness, light-headedness, numbness and headaches.  Hematological: Negative for adenopathy. Does not bruise/bleed easily.  Psychiatric/Behavioral: Negative.        Objective:   Physical Exam  Constitutional: He is oriented to person, place, and time. He appears well-developed and well-nourished. No distress.  HENT:  Head: Normocephalic and atraumatic.  Mouth/Throat: Oropharynx is clear and moist. No oropharyngeal exudate.  Eyes: Conjunctivae are normal. Right eye exhibits no discharge. Left eye exhibits no discharge. No scleral icterus.  Neck: Normal range of motion. Neck supple. No JVD present. No tracheal deviation present. No thyromegaly present.  Cardiovascular: Normal rate, regular rhythm, normal heart sounds and intact distal pulses.  Exam reveals no gallop and no friction rub.   No murmur heard. Pulmonary/Chest: Effort normal and breath sounds normal. No stridor. No respiratory distress. He has no wheezes. He has no rales. He exhibits no tenderness.  Abdominal: Soft. Bowel sounds are normal. He exhibits no distension and no mass. There is no tenderness. There is no rebound and no guarding.  Musculoskeletal: Normal range of motion. He exhibits no edema and no tenderness.  Lymphadenopathy:    He has no cervical adenopathy.  Neurological: He is oriented to person, place, and time.  Skin: Skin is warm, dry and intact. Rash (scaling on both feet) noted. No abrasion, no bruising, no burn, no  ecchymosis, no laceration, no lesion, no petechiae and no purpura  noted. Rash is not macular, not papular, not maculopapular, not nodular, not pustular, not vesicular and not urticarial. He is not diaphoretic. No cyanosis or erythema. No pallor. Nails show no clubbing.  Psychiatric: He has a normal mood and affect. His behavior is normal. Judgment and thought content normal.      Lab Results  Component Value Date   WBC 11.6* 10/24/2011   HGB 14.2 10/24/2011   HCT 42.6 10/24/2011   PLT 293.0 10/24/2011   GLUCOSE 90 10/24/2011   CHOL 252* 10/24/2011   TRIG 112.0 10/24/2011   HDL 72.70 10/24/2011   LDLDIRECT 159.5 10/24/2011   ALT 27 10/24/2011   AST 26 10/24/2011   NA 140 10/24/2011   K 3.8 10/24/2011   CL 101 10/24/2011   CREATININE 1.5 10/24/2011   BUN 19 10/24/2011   CO2 28 10/24/2011   TSH 40.25* 10/24/2011   PSA 1.80 10/24/2011   INR 0.9 10/17/2007      Assessment & Plan:

## 2012-02-10 NOTE — Assessment & Plan Note (Signed)
Urology referral

## 2012-02-10 NOTE — Assessment & Plan Note (Signed)
Start lotrisone cream 

## 2012-02-14 LAB — PROTEIN ELECTROPHORESIS, SERUM
Albumin ELP: 57.1 % (ref 55.8–66.1)
Total Protein, Serum Electrophoresis: 6.1 g/dL (ref 6.0–8.3)

## 2012-03-08 ENCOUNTER — Ambulatory Visit (INDEPENDENT_AMBULATORY_CARE_PROVIDER_SITE_OTHER): Payer: Medicare Other | Admitting: Internal Medicine

## 2012-03-08 ENCOUNTER — Encounter: Payer: Self-pay | Admitting: Internal Medicine

## 2012-03-08 VITALS — BP 134/78 | HR 60 | Temp 98.3°F | Resp 16 | Wt 213.8 lb

## 2012-03-08 DIAGNOSIS — S139XXA Sprain of joints and ligaments of unspecified parts of neck, initial encounter: Secondary | ICD-10-CM

## 2012-03-08 DIAGNOSIS — I10 Essential (primary) hypertension: Secondary | ICD-10-CM

## 2012-03-08 DIAGNOSIS — S161XXA Strain of muscle, fascia and tendon at neck level, initial encounter: Secondary | ICD-10-CM | POA: Insufficient documentation

## 2012-03-08 MED ORDER — NAPROXEN SODIUM ER 750 MG PO TB24: 1.0000 | ORAL_TABLET | Freq: Every day | ORAL | Status: DC

## 2012-03-08 NOTE — Assessment & Plan Note (Signed)
His BP is well controlled 

## 2012-03-08 NOTE — Progress Notes (Signed)
  Subjective:    Patient ID: Ronald Bond, male    DOB: September 16, 1946, 66 y.o.   MRN: [id number]  Neck Pain  This is a new problem. The current episode started 1 to 4 weeks ago. The problem occurs intermittently. The problem has been unchanged. The pain is associated with a sleep position. The pain is present in the right side. The quality of the pain is described as aching. The pain is at a severity of 1/10. The pain is mild. The symptoms are aggravated by twisting. The pain is same all the time. Stiffness is present in the morning. Pertinent negatives include no chest pain, fever, headaches, leg pain, numbness, pain with swallowing, paresis, photophobia, syncope, tingling, trouble swallowing, visual change, weakness or weight loss. He has tried nothing for the symptoms.      Review of Systems  Constitutional: Negative for fever, chills, weight loss, diaphoresis, activity change, appetite change, fatigue and unexpected weight change.  HENT: Positive for neck pain and neck stiffness. Negative for hearing loss, ear pain, nosebleeds, congestion, sore throat, facial swelling, rhinorrhea, sneezing, drooling, mouth sores, trouble swallowing, dental problem, voice change, postnasal drip, sinus pressure, tinnitus and ear discharge.   Eyes: Negative.  Negative for photophobia.  Respiratory: Negative for apnea, cough, choking, chest tightness, shortness of breath, wheezing and stridor.   Cardiovascular: Negative for chest pain, palpitations, leg swelling and syncope.  Genitourinary: Negative.   Skin: Negative for color change, pallor, rash and wound.  Neurological: Negative.  Negative for tingling, weakness, numbness and headaches.  Hematological: Negative for adenopathy. Does not bruise/bleed easily.  Psychiatric/Behavioral: Negative.  Negative for agitation.       Objective:   Physical Exam  Vitals reviewed. Constitutional: He is oriented to person, place, and time. He appears well-developed and  well-nourished. No distress.  HENT:  Head: Normocephalic and atraumatic.  Mouth/Throat: Oropharynx is clear and moist. No oropharyngeal exudate.  Eyes: Conjunctivae are normal. Right eye exhibits no discharge. Left eye exhibits no discharge. No scleral icterus.  Neck: Trachea normal, normal range of motion and full passive range of motion without pain. Neck supple. Normal carotid pulses, no hepatojugular reflux and no JVD present. Muscular tenderness present. No tracheal tenderness and no spinous process tenderness present. Carotid bruit is not present. No rigidity. No tracheal deviation, no edema, no erythema and normal range of motion present. No mass and no thyromegaly present.    Cardiovascular: Normal rate, regular rhythm, normal heart sounds and intact distal pulses.  Exam reveals no gallop.   No murmur heard. Pulmonary/Chest: Effort normal and breath sounds normal. No stridor. No respiratory distress. He has no wheezes. He has no rales. He exhibits no tenderness.  Abdominal: Soft. Bowel sounds are normal. He exhibits no distension and no mass. There is no tenderness. There is no rebound and no guarding.  Musculoskeletal: Normal range of motion. He exhibits no edema and no tenderness.  Lymphadenopathy:    He has no cervical adenopathy.  Neurological: He is oriented to person, place, and time.  Skin: Skin is warm and dry. No rash noted. He is not diaphoretic. No erythema. No pallor.  Psychiatric: He has a normal mood and affect. His behavior is normal. Judgment and thought content normal.          Assessment & Plan:

## 2012-03-08 NOTE — Assessment & Plan Note (Signed)
He will try some nsaids and he was given pt ed material as well

## 2012-03-08 NOTE — Patient Instructions (Signed)
Muscle Strain A muscle strain, or pulled muscle, occurs when a muscle is over-stretched. A small number of muscle fibers may also be torn. This is especially common in athletes. This happens when a sudden violent force placed on a muscle pushes it past its capacity. Usually, recovery from a pulled muscle takes 1 to 2 weeks. But complete healing will take 5 to 6 weeks. There are millions of muscle fibers. Following injury, your body will usually return to normal quickly. HOME CARE INSTRUCTIONS   While awake, apply ice to the sore muscle for 15 to 20 minutes each hour for the first 2 days. Put ice in a plastic bag and place a towel between the bag of ice and your skin.   Do not use the pulled muscle for several days. Do not use the muscle if you have pain.   You may wrap the injured area with an elastic bandage for comfort. Be careful not to bind it too tightly. This may interfere with blood circulation.   Only take over-the-counter or prescription medicines for pain, discomfort, or fever as directed by your caregiver. Do not use aspirin as this will increase bleeding (bruising) at injury site.   Warming up before exercise helps prevent muscle strains.  SEEK MEDICAL CARE IF:  There is increased pain or swelling in the affected area. MAKE SURE YOU:   Understand these instructions.   Will watch your condition.   Will get help right away if you are not doing well or get worse.  Document Released: 11/14/2005 Document Revised: 11/03/2011 Document Reviewed: 06/13/2007 [employer] Patient Information 2012 [employer]. 

## 2012-04-16 ENCOUNTER — Ambulatory Visit (INDEPENDENT_AMBULATORY_CARE_PROVIDER_SITE_OTHER): Payer: Medicare Other | Admitting: Internal Medicine

## 2012-04-16 ENCOUNTER — Encounter: Payer: Self-pay | Admitting: Internal Medicine

## 2012-04-16 ENCOUNTER — Other Ambulatory Visit (INDEPENDENT_AMBULATORY_CARE_PROVIDER_SITE_OTHER): Payer: Medicare Other

## 2012-04-16 VITALS — BP 130/62 | HR 58 | Temp 98.7°F | Resp 16 | Wt 212.4 lb

## 2012-04-16 DIAGNOSIS — E876 Hypokalemia: Secondary | ICD-10-CM

## 2012-04-16 DIAGNOSIS — E785 Hyperlipidemia, unspecified: Secondary | ICD-10-CM

## 2012-04-16 DIAGNOSIS — E039 Hypothyroidism, unspecified: Secondary | ICD-10-CM

## 2012-04-16 DIAGNOSIS — R7309 Other abnormal glucose: Secondary | ICD-10-CM | POA: Insufficient documentation

## 2012-04-16 DIAGNOSIS — I10 Essential (primary) hypertension: Secondary | ICD-10-CM

## 2012-04-16 LAB — COMPREHENSIVE METABOLIC PANEL
AST: 22 U/L (ref 0–37)
Albumin: 4 g/dL (ref 3.5–5.2)
Alkaline Phosphatase: 54 U/L (ref 39–117)
BUN: 17 mg/dL (ref 6–23)
Creatinine, Ser: 1.2 mg/dL (ref 0.4–1.5)
Glucose, Bld: 98 mg/dL (ref 70–99)
Potassium: 3.3 mEq/L — ABNORMAL LOW (ref 3.5–5.1)
Total Bilirubin: 0.6 mg/dL (ref 0.3–1.2)

## 2012-04-16 LAB — MAGNESIUM: Magnesium: 2.2 mg/dL (ref 1.5–2.5)

## 2012-04-16 LAB — HEMOGLOBIN A1C: Hgb A1c MFr Bld: 6 % (ref 4.6–6.5)

## 2012-04-16 NOTE — Assessment & Plan Note (Signed)
I will check his a1c today to see if he has DM II 

## 2012-04-16 NOTE — Assessment & Plan Note (Signed)
His BP is well controlled, I will check his lytes and renal function today 

## 2012-04-16 NOTE — Assessment & Plan Note (Addendum)
I will check his TSH level to see if his dose is accurate and will make changes if needed  Late note, his TSH is low so I will lower the dose of his thyroid replacement

## 2012-04-16 NOTE — Assessment & Plan Note (Signed)
He has aching in his hands so I will check a CPK level to look for myopathy and have asked him to stop taking crestor for a 2-3 month trial to see if that will help with the pain in his hands, he is getting symptom relief with tylenol and he will continue with that for now, I will also check his CMP today

## 2012-04-16 NOTE — Progress Notes (Signed)
Subjective:    Patient ID: Ronald Bond, male    DOB: Jan 10, 1946, 66 y.o.   MRN: [id number]  Hyperlipidemia This is a chronic problem. The current episode started more than 1 year ago. The problem is controlled. Recent lipid tests were reviewed and are variable. Exacerbating diseases include hypothyroidism. He has no history of chronic renal disease, diabetes, liver disease, obesity or nephrotic syndrome. Factors aggravating his hyperlipidemia include no known factors. Associated symptoms include myalgias (aching in his hands at night and in the morning). Pertinent negatives include no chest pain, focal sensory loss, focal weakness, leg pain or shortness of breath. Current antihyperlipidemic treatment includes statins. The current treatment provides moderate improvement of lipids. Compliance problems include adherence to exercise and medication cost.   Hypertension This is a chronic problem. The current episode started more than 1 year ago. The problem has been gradually improving since onset. The problem is controlled. Pertinent negatives include no anxiety, blurred vision, chest pain, headaches, malaise/fatigue, neck pain, orthopnea, palpitations, peripheral edema, PND, shortness of breath or sweats. Past treatments include angiotensin blockers, calcium channel blockers and diuretics. The current treatment provides significant improvement. Compliance problems include exercise and diet.  Hypertensive end-organ damage includes a thyroid problem. There is no history of chronic renal disease.      Review of Systems  Constitutional: Negative for fever, chills, malaise/fatigue, diaphoresis, activity change, appetite change, fatigue and unexpected weight change.  HENT: Negative.  Negative for neck pain.   Eyes: Negative.  Negative for blurred vision.  Respiratory: Negative for cough, chest tightness, shortness of breath and stridor.   Cardiovascular: Negative for chest pain, palpitations, orthopnea,  leg swelling and PND.  Gastrointestinal: Negative for nausea, vomiting, abdominal pain, diarrhea, constipation, blood in stool and abdominal distention.  Genitourinary: Negative.   Musculoskeletal: Positive for myalgias (aching in his hands at night and in the morning) and arthralgias (in his hands). Negative for back pain, joint swelling and gait problem.  Skin: Negative for color change, pallor, rash and wound.  Neurological: Negative.  Negative for focal weakness and headaches.  Hematological: Negative for adenopathy. Does not bruise/bleed easily.  Psychiatric/Behavioral: Negative.        Objective:   Physical Exam  Vitals reviewed. Constitutional: He is oriented to person, place, and time. He appears well-developed and well-nourished. No distress.  HENT:  Head: Normocephalic and atraumatic.  Mouth/Throat: Oropharynx is clear and moist. No oropharyngeal exudate.  Eyes: Conjunctivae are normal. Right eye exhibits no discharge. Left eye exhibits no discharge. No scleral icterus.  Neck: Normal range of motion. Neck supple. No JVD present. No tracheal deviation present. No thyromegaly present.  Cardiovascular: Normal rate, regular rhythm, normal heart sounds and intact distal pulses.  Exam reveals no gallop and no friction rub.   No murmur heard. Pulmonary/Chest: Effort normal and breath sounds normal. No respiratory distress. He has no wheezes. He has no rales. He exhibits no tenderness.  Abdominal: Soft. Bowel sounds are normal. He exhibits no distension and no mass. There is no tenderness. There is no rebound and no guarding.  Musculoskeletal: Normal range of motion. He exhibits no edema and no tenderness.  Lymphadenopathy:    He has no cervical adenopathy.  Neurological: He is oriented to person, place, and time.  Skin: Skin is warm and dry. No rash noted. He is not diaphoretic. No erythema. No pallor.  Psychiatric: He has a normal mood and affect. His behavior is normal. Judgment and  thought content normal.  Lab Results  Component Value Date   WBC 8.7 02/10/2012   HGB 13.5 02/10/2012   HCT 40.8 02/10/2012   PLT 255.0 02/10/2012   GLUCOSE 103* 02/10/2012   CHOL 198 02/10/2012   TRIG 68.0 02/10/2012   HDL 68.40 02/10/2012   LDLDIRECT 159.5 10/24/2011   LDLCALC 116* 02/10/2012   ALT 20 02/10/2012   AST 22 02/10/2012   NA 139 02/10/2012   K 3.2* 02/10/2012   CL 101 02/10/2012   CREATININE 1.5 02/10/2012   BUN 18 02/10/2012   CO2 31 02/10/2012   TSH 10.82* 02/10/2012   PSA 1.80 10/24/2011   INR 0.9 10/17/2007       Assessment & Plan:

## 2012-04-16 NOTE — Assessment & Plan Note (Addendum)
I will check his K+ and Mg++ today and see if he needs K+ replacement  Late note, his K+ level is low so I have sent an Rx to his pharmacy

## 2012-04-16 NOTE — Patient Instructions (Signed)

## 2012-04-17 LAB — TSH: TSH: 0.19 u[IU]/mL — ABNORMAL LOW (ref 0.35–5.50)

## 2012-04-18 ENCOUNTER — Encounter: Payer: Self-pay | Admitting: Internal Medicine

## 2012-04-18 MED ORDER — LEVOTHYROXINE SODIUM 175 MCG PO TABS: 175.0000 ug | ORAL_TABLET | Freq: Every day | ORAL | Status: DC

## 2012-04-18 MED ORDER — POTASSIUM CHLORIDE CRYS ER 20 MEQ PO TBCR: 20.0000 meq | EXTENDED_RELEASE_TABLET | Freq: Two times a day (BID) | ORAL | Status: DC

## 2012-04-18 NOTE — Progress Notes (Signed)
Addended by: [name] on: 04/18/2012 07:47 AM   Modules accepted: Orders

## 2012-05-07 ENCOUNTER — Telehealth: Payer: Self-pay | Admitting: Internal Medicine

## 2012-05-07 NOTE — Telephone Encounter (Signed)
Received 5 pages from [facility], sent to Dr, Yetta Barre, [place] 05/07/12

## 2012-08-01 ENCOUNTER — Encounter: Payer: Self-pay | Admitting: Internal Medicine

## 2012-08-01 ENCOUNTER — Other Ambulatory Visit (INDEPENDENT_AMBULATORY_CARE_PROVIDER_SITE_OTHER): Payer: Medicare Other

## 2012-08-01 ENCOUNTER — Ambulatory Visit (INDEPENDENT_AMBULATORY_CARE_PROVIDER_SITE_OTHER): Payer: Medicare Other | Admitting: Internal Medicine

## 2012-08-01 VITALS — BP 112/68 | HR 72 | Temp 98.7°F | Resp 16 | Wt 212.0 lb

## 2012-08-01 DIAGNOSIS — K219 Gastro-esophageal reflux disease without esophagitis: Secondary | ICD-10-CM

## 2012-08-01 DIAGNOSIS — E039 Hypothyroidism, unspecified: Secondary | ICD-10-CM

## 2012-08-01 DIAGNOSIS — E876 Hypokalemia: Secondary | ICD-10-CM

## 2012-08-01 DIAGNOSIS — I10 Essential (primary) hypertension: Secondary | ICD-10-CM

## 2012-08-01 DIAGNOSIS — I517 Cardiomegaly: Secondary | ICD-10-CM

## 2012-08-01 DIAGNOSIS — R1310 Dysphagia, unspecified: Secondary | ICD-10-CM | POA: Insufficient documentation

## 2012-08-01 LAB — BASIC METABOLIC PANEL
CO2: 25 mEq/L (ref 19–32)
Calcium: 9.1 mg/dL (ref 8.4–10.5)
Sodium: 135 mEq/L (ref 135–145)

## 2012-08-01 LAB — TSH: TSH: 0.64 u[IU]/mL (ref 0.35–5.50)

## 2012-08-01 LAB — MAGNESIUM: Magnesium: 2.4 mg/dL (ref 1.5–2.5)

## 2012-08-01 MED ORDER — DEXLANSOPRAZOLE 60 MG PO CPDR: 60.0000 mg | DELAYED_RELEASE_CAPSULE | Freq: Every day | ORAL | Status: DC

## 2012-08-01 MED ORDER — AMLODIPINE-VALSARTAN-HCTZ 10-320-25 MG PO TABS: 1.0000 | ORAL_TABLET | Freq: Every day | ORAL | Status: DC

## 2012-08-01 MED ORDER — GLYCOPYRROLATE 1 MG PO TABS: 1.0000 mg | ORAL_TABLET | Freq: Three times a day (TID) | ORAL | Status: DC

## 2012-08-01 NOTE — Progress Notes (Signed)
Subjective:    Patient ID: Ronald Bond, male    DOB: 01/01/1946, 66 y.o.   MRN: [id number]  Gastrophageal Reflux He complains of belching, choking and dysphagia. He reports no abdominal pain, no chest pain, no coughing, no early satiety, no globus sensation, no heartburn, no hoarse voice, no nausea, no sore throat, no stridor, no tooth decay, no water brash or no wheezing. This is a chronic problem. The current episode started more than 1 year ago. The problem occurs occasionally. The problem has been gradually worsening. Nothing aggravates the symptoms. Pertinent negatives include no anemia, fatigue, melena, muscle weakness, orthopnea or weight loss. He has tried a PPI for the symptoms. The treatment provided moderate relief.      Review of Systems  Constitutional: Negative for fever, chills, weight loss, diaphoresis, activity change, appetite change, fatigue and unexpected weight change.  HENT: Negative.  Negative for sore throat and hoarse voice.   Eyes: Negative.   Respiratory: Positive for choking. Negative for apnea, cough, chest tightness, shortness of breath, wheezing and stridor.   Cardiovascular: Negative for chest pain, palpitations and leg swelling.  Gastrointestinal: Positive for dysphagia. Negative for heartburn, nausea, vomiting, abdominal pain, diarrhea, constipation, blood in stool, melena, abdominal distention, anal bleeding and rectal pain.  Genitourinary: Negative.   Musculoskeletal: Positive for arthralgias. Negative for myalgias, back pain, joint swelling, gait problem and muscle weakness.  Skin: Negative for color change, pallor, rash and wound.  Neurological: Negative.   Hematological: Negative for adenopathy. Does not bruise/bleed easily.  Psychiatric/Behavioral: Negative.        Objective:   Physical Exam  Vitals reviewed. Constitutional: He is oriented to person, place, and time. He appears well-developed and well-nourished. No distress.  HENT:  Head:  Normocephalic and atraumatic.  Mouth/Throat: Oropharynx is clear and moist. No oropharyngeal exudate.  Eyes: Conjunctivae are normal. Right eye exhibits no discharge. Left eye exhibits no discharge. No scleral icterus.  Neck: Normal range of motion. Neck supple. No JVD present. No tracheal deviation present. No thyromegaly present.  Cardiovascular: Normal rate, regular rhythm, normal heart sounds and intact distal pulses.  Exam reveals no gallop and no friction rub.   No murmur heard. Pulmonary/Chest: Effort normal and breath sounds normal. No stridor. No respiratory distress. He has no wheezes. He has no rales. He exhibits no tenderness.  Abdominal: Soft. Bowel sounds are normal. He exhibits no distension and no mass. There is no tenderness. There is no rebound and no guarding.  Musculoskeletal: Normal range of motion. He exhibits no edema and no tenderness.  Lymphadenopathy:    He has no cervical adenopathy.  Neurological: He is oriented to person, place, and time.  Skin: Skin is warm and dry. No rash noted. He is not diaphoretic. No erythema. No pallor.  Psychiatric: He has a normal mood and affect. His behavior is normal. Judgment and thought content normal.     Lab Results  Component Value Date   WBC 8.7 02/10/2012   HGB 13.5 02/10/2012   HCT 40.8 02/10/2012   PLT 255.0 02/10/2012   GLUCOSE 98 04/16/2012   CHOL 198 02/10/2012   TRIG 68.0 02/10/2012   HDL 68.40 02/10/2012   LDLDIRECT 159.5 10/24/2011   LDLCALC 116* 02/10/2012   ALT 28 04/16/2012   AST 22 04/16/2012   NA 141 04/16/2012   K 3.3* 04/16/2012   CL 107 04/16/2012   CREATININE 1.2 04/16/2012   BUN 17 04/16/2012   CO2 26 04/16/2012   TSH 0.19* 04/16/2012  PSA 1.80 10/24/2011   INR 0.9 10/17/2007   HGBA1C 6.0 04/16/2012       Assessment & Plan:

## 2012-08-01 NOTE — Patient Instructions (Signed)

## 2012-08-01 NOTE — Assessment & Plan Note (Signed)
I am concerned that he may have some UGI pathology so I have asked him to see GI to consider getting an EGD done, he will start robinul to help with the swallowing difficulty

## 2012-08-01 NOTE — Assessment & Plan Note (Signed)
I will check his BMP and his Mg++ level today

## 2012-08-01 NOTE — Assessment & Plan Note (Signed)
I will change his PPi to dexilant for better efficacy and have added robinul as well.

## 2012-08-01 NOTE — Assessment & Plan Note (Signed)
I will recheck his TSH level today 

## 2012-08-01 NOTE — Assessment & Plan Note (Signed)
His BP is well controlled 

## 2012-08-03 ENCOUNTER — Encounter: Payer: Self-pay | Admitting: Gastroenterology

## 2012-08-29 ENCOUNTER — Ambulatory Visit: Payer: Medicare Other | Admitting: Gastroenterology

## 2012-09-04 ENCOUNTER — Other Ambulatory Visit: Payer: Self-pay | Admitting: Gastroenterology

## 2012-09-04 DIAGNOSIS — R1310 Dysphagia, unspecified: Secondary | ICD-10-CM

## 2012-09-05 ENCOUNTER — Ambulatory Visit
Admission: RE | Admit: 2012-09-05 | Discharge: 2012-09-05 | Disposition: A | Discharge status: Home / Self Care | Payer: Medicare Other | Source: Ambulatory Visit | Attending: Gastroenterology | Admitting: Gastroenterology

## 2012-09-05 DIAGNOSIS — R1310 Dysphagia, unspecified: Secondary | ICD-10-CM

## 2012-09-10 ENCOUNTER — Ambulatory Visit: Payer: Medicare Other | Admitting: Internal Medicine

## 2012-09-10 DIAGNOSIS — K2970 Gastritis, unspecified, without bleeding: Secondary | ICD-10-CM | POA: Diagnosis not present

## 2012-09-25 ENCOUNTER — Telehealth: Payer: Self-pay | Admitting: Internal Medicine

## 2012-09-25 NOTE — Telephone Encounter (Signed)
The patient called and is hoping to get the no-show fee removed.  He states he told someone he did not want the apt.

## 2012-09-26 NOTE — Telephone Encounter (Signed)
done

## 2012-10-01 ENCOUNTER — Ambulatory Visit (INDEPENDENT_AMBULATORY_CARE_PROVIDER_SITE_OTHER): Payer: Medicare Other | Admitting: Internal Medicine

## 2012-10-01 ENCOUNTER — Other Ambulatory Visit (INDEPENDENT_AMBULATORY_CARE_PROVIDER_SITE_OTHER): Payer: Medicare Other

## 2012-10-01 ENCOUNTER — Encounter: Payer: Self-pay | Admitting: Internal Medicine

## 2012-10-01 VITALS — BP 122/82 | HR 69 | Temp 97.7°F | Resp 16 | Wt 215.2 lb

## 2012-10-01 DIAGNOSIS — E876 Hypokalemia: Secondary | ICD-10-CM

## 2012-10-01 DIAGNOSIS — H8309 Labyrinthitis, unspecified ear: Secondary | ICD-10-CM

## 2012-10-01 DIAGNOSIS — I10 Essential (primary) hypertension: Secondary | ICD-10-CM

## 2012-10-01 LAB — BASIC METABOLIC PANEL
BUN: 16 mg/dL (ref 6–23)
CO2: 26 mEq/L (ref 19–32)
Chloride: 101 mEq/L (ref 96–112)
GFR: 59.15 mL/min — ABNORMAL LOW (ref >60.00)
Glucose, Bld: 91 mg/dL (ref 70–99)
Potassium: 3.9 mEq/L (ref 3.5–5.1)
Sodium: 136 mEq/L (ref 135–145)

## 2012-10-01 MED ORDER — MECLIZINE HCL 50 MG PO TABS: 50.0000 mg | ORAL_TABLET | Freq: Three times a day (TID) | ORAL | Status: DC | PRN

## 2012-10-01 MED ORDER — PREDNISONE 50 MG PO TABS: ORAL_TABLET | ORAL | Status: DC

## 2012-10-01 NOTE — Patient Instructions (Signed)
Vertigo Vertigo means you feel like you or your surroundings are moving when they are not. Vertigo can be dangerous if it occurs when you are at work, driving, or performing difficult activities.  CAUSES  Vertigo occurs when there is a conflict of signals sent to your brain from the visual and sensory systems in your body. There are many different causes of vertigo, including:  Infections, especially in the inner ear.  A bad reaction to a drug or misuse of alcohol and medicines.  Withdrawal from drugs or alcohol.  Rapidly changing positions, such as lying down or rolling over in bed.  A migraine headache.  Decreased blood flow to the brain.  Increased pressure in the brain from a head injury, infection, tumor, or bleeding. SYMPTOMS  You may feel as though the world is spinning around or you are falling to the ground. Because your balance is upset, vertigo can cause nausea and vomiting. You may have involuntary eye movements (nystagmus). DIAGNOSIS  Vertigo is usually diagnosed by physical exam. If the cause of your vertigo is unknown, your caregiver may perform imaging tests, such as an MRI scan (magnetic resonance imaging). TREATMENT  Most cases of vertigo resolve on their own, without treatment. Depending on the cause, your caregiver may prescribe certain medicines. If your vertigo is related to body position issues, your caregiver may recommend movements or procedures to correct the problem. In rare cases, if your vertigo is caused by certain inner ear problems, you may need surgery. HOME CARE INSTRUCTIONS   Follow your caregiver's instructions.  Avoid driving.  Avoid operating heavy machinery.  Avoid performing any tasks that would be dangerous to you or others during a vertigo episode.  Tell your caregiver if you notice that certain medicines seem to be causing your vertigo. Some of the medicines used to treat vertigo episodes can actually make them worse in some people. SEEK  IMMEDIATE MEDICAL CARE IF:   Your medicines do not relieve your vertigo or are making it worse.  You develop problems with talking, walking, weakness, or using your arms, hands, or legs.  You develop severe headaches.  Your nausea or vomiting continues or gets worse.  You develop visual changes.  A family member notices behavioral changes.  Your condition gets worse. MAKE SURE YOU:  Understand these instructions.  Will watch your condition.  Will get help right away if you are not doing well or get worse. Document Released: 08/24/2005 Document Revised: 02/06/2012 Document Reviewed: 06/02/2011 [employer] Patient Information 2013 [employer].  

## 2012-10-01 NOTE — Progress Notes (Signed)
  Subjective:    Patient ID: Ronald Bond, male    DOB: 01/14/1946, 66 y.o.   MRN: [id number]  HPI  He returns c/o a one week history of dizziness and vertigo.  Review of Systems  Constitutional: Negative.   HENT: Negative.   Eyes: Negative.   Respiratory: Negative for cough, chest tightness, shortness of breath, wheezing and stridor.   Cardiovascular: Negative for chest pain, palpitations and leg swelling.  Gastrointestinal: Negative.  Negative for nausea, abdominal pain and diarrhea.  Genitourinary: Negative.   Musculoskeletal: Negative.   Skin: Negative for color change, pallor, rash and wound.  Neurological: Positive for dizziness. Negative for tremors, seizures, syncope, facial asymmetry, speech difficulty, weakness, light-headedness, numbness and headaches.  Hematological: Negative for adenopathy. Does not bruise/bleed easily.  Psychiatric/Behavioral: Negative.        Objective:   Physical Exam  Vitals reviewed. Constitutional: He is oriented to person, place, and time. He appears well-developed and well-nourished. No distress.  HENT:  Head: Normocephalic and atraumatic.  Mouth/Throat: Oropharynx is clear and moist. No oropharyngeal exudate.  Eyes: Conjunctivae normal and EOM are normal. Pupils are equal, round, and reactive to light. Right eye exhibits no discharge. Left eye exhibits no discharge. No scleral icterus.  Neck: Normal range of motion. Neck supple. No JVD present. No tracheal deviation present. No thyromegaly present.  Cardiovascular: Normal rate, regular rhythm, normal heart sounds and intact distal pulses.  Exam reveals no gallop and no friction rub.   No murmur heard. Pulmonary/Chest: Effort normal and breath sounds normal. No stridor. No respiratory distress. He has no wheezes. He has no rales. He exhibits no tenderness.  Abdominal: Soft. Bowel sounds are normal. He exhibits no distension. There is no tenderness. There is no rebound and no guarding.    Musculoskeletal: Normal range of motion. He exhibits no edema and no tenderness.  Lymphadenopathy:    He has no cervical adenopathy.  Neurological: He is alert and oriented to person, place, and time. He has normal reflexes. He displays normal reflexes. No cranial nerve deficit. He exhibits normal muscle tone. Coordination normal.  Skin: Skin is warm and dry. No rash noted. He is not diaphoretic. No erythema. No pallor.  Psychiatric: He has a normal mood and affect. His behavior is normal. Judgment and thought content normal.     Lab Results  Component Value Date   WBC 8.7 02/10/2012   HGB 13.5 02/10/2012   HCT 40.8 02/10/2012   PLT 255.0 02/10/2012   GLUCOSE 88 08/01/2012   CHOL 198 02/10/2012   TRIG 68.0 02/10/2012   HDL 68.40 02/10/2012   LDLDIRECT 159.5 10/24/2011   LDLCALC 116* 02/10/2012   ALT 28 04/16/2012   AST 22 04/16/2012   NA 135 08/01/2012   K 3.4* 08/01/2012   CL 102 08/01/2012   CREATININE 1.7* 08/01/2012   BUN 21 08/01/2012   CO2 25 08/01/2012   TSH 0.64 08/01/2012   PSA 1.80 10/24/2011   INR 0.9 10/17/2007   HGBA1C 6.0 04/16/2012       Assessment & Plan:

## 2012-10-02 NOTE — Assessment & Plan Note (Signed)
His BP is well controlled 

## 2012-10-02 NOTE — Assessment & Plan Note (Signed)
I will check his K+ and Mg++ levels today 

## 2012-10-02 NOTE — Assessment & Plan Note (Signed)
I think a 5 day course of prednisone will help with his symptoms, he will try antivert as well

## 2012-10-18 ENCOUNTER — Ambulatory Visit (INDEPENDENT_AMBULATORY_CARE_PROVIDER_SITE_OTHER): Payer: Medicare Other | Admitting: Internal Medicine

## 2012-10-18 ENCOUNTER — Encounter: Payer: Self-pay | Admitting: Internal Medicine

## 2012-10-18 ENCOUNTER — Ambulatory Visit (INDEPENDENT_AMBULATORY_CARE_PROVIDER_SITE_OTHER)
Admission: RE | Admit: 2012-10-18 | Discharge: 2012-10-18 | Disposition: A | Discharge status: Home / Self Care | Payer: Medicare Other | Source: Ambulatory Visit | Attending: Internal Medicine | Admitting: Internal Medicine

## 2012-10-18 VITALS — BP 130/68 | HR 58 | Temp 98.4°F | Resp 16 | Wt 212.0 lb

## 2012-10-18 DIAGNOSIS — M545 Low back pain, unspecified: Secondary | ICD-10-CM

## 2012-10-18 DIAGNOSIS — M5126 Other intervertebral disc displacement, lumbar region: Secondary | ICD-10-CM | POA: Insufficient documentation

## 2012-10-18 MED ORDER — CELECOXIB 200 MG PO CAPS: 200.0000 mg | ORAL_CAPSULE | Freq: Two times a day (BID) | ORAL | Status: DC

## 2012-10-18 MED ORDER — METHYLPREDNISOLONE ACETATE 80 MG/ML IJ SUSP
120.0000 mg | Freq: Once | INTRAMUSCULAR | Status: AC
  Administered 2012-10-18: 120 mg via INTRAMUSCULAR

## 2012-10-18 NOTE — Patient Instructions (Addendum)

## 2012-10-18 NOTE — Assessment & Plan Note (Signed)
I will check a plain film to see if he has an occult fracture, ddd, etc. He will continue taking the MR and I have given him samples of celebrex as well.

## 2012-10-18 NOTE — Assessment & Plan Note (Signed)
He has s/s c/w an acute herniation so I gave him am injection of depo-medrol IM to reduce the inflammation, he will try celebrex, I ecpect him to improve but is needed I will order an MRI to see if there is spinal stenosis, nerve impingement, etc

## 2012-10-18 NOTE — Progress Notes (Signed)
Subjective:    Patient ID: Ronald Bond, male    DOB: 1946-10-24, 66 y.o.   MRN: [id number]  Back Pain This is a new problem. The current episode started in the past 7 days. The problem is unchanged. The pain is present in the lumbar spine. The quality of the pain is described as aching and stabbing. The pain radiates to the right thigh and left thigh. The pain is at a severity of 4/10. The pain is mild. The pain is worse during the day. The symptoms are aggravated by bending and standing. Stiffness is present all day. Pertinent negatives include no abdominal pain, bladder incontinence, bowel incontinence, chest pain, dysuria, fever, headaches, leg pain, numbness, paresis, paresthesias, pelvic pain, perianal numbness, tingling, weakness or weight loss. Risk factors include recent trauma and lack of exercise. He has tried muscle relaxant for the symptoms. The treatment provided mild relief.      Review of Systems  Constitutional: Negative for fever, chills, weight loss, fatigue and unexpected weight change.  HENT: Negative.   Eyes: Negative.   Respiratory: Negative for cough, chest tightness, shortness of breath, wheezing and stridor.   Cardiovascular: Negative for chest pain, palpitations and leg swelling.  Gastrointestinal: Negative for nausea, vomiting, abdominal pain, diarrhea, constipation and bowel incontinence.  Genitourinary: Negative.  Negative for bladder incontinence, dysuria and pelvic pain.  Musculoskeletal: Positive for back pain. Negative for myalgias, joint swelling, arthralgias and gait problem.  Skin: Negative.   Neurological: Negative.  Negative for tingling, weakness, numbness, headaches and paresthesias.  Hematological: Negative for adenopathy. Does not bruise/bleed easily.  Psychiatric/Behavioral: Negative.        Objective:   Physical Exam  Vitals reviewed. Constitutional: He is oriented to person, place, and time. He appears well-developed and well-nourished. No  distress.  HENT:  Head: Normocephalic and atraumatic.  Mouth/Throat: Oropharynx is clear and moist. No oropharyngeal exudate.  Eyes: Conjunctivae normal are normal. Right eye exhibits no discharge. Left eye exhibits no discharge. No scleral icterus.  Neck: Normal range of motion. Neck supple. No JVD present. No tracheal deviation present. No thyromegaly present.  Cardiovascular: Normal rate, regular rhythm, normal heart sounds and intact distal pulses.  Exam reveals no gallop and no friction rub.   No murmur heard. Pulmonary/Chest: Effort normal and breath sounds normal. No stridor. No respiratory distress. He has no wheezes. He has no rales. He exhibits no tenderness.  Abdominal: Soft. Bowel sounds are normal. He exhibits no distension and no mass. There is no tenderness. There is no rebound and no guarding.  Musculoskeletal: He exhibits no edema and no tenderness.       Lumbar back: Normal. He exhibits normal range of motion, no tenderness, no bony tenderness, no swelling, no edema, no deformity, no laceration, no pain, no spasm and normal pulse.  Lymphadenopathy:    He has no cervical adenopathy.  Neurological: He is alert and oriented to person, place, and time. He has normal strength. He displays no atrophy, no tremor and normal reflexes. No cranial nerve deficit or sensory deficit. He exhibits normal muscle tone. He displays a negative Romberg sign. He displays no seizure activity. Coordination and gait normal. He displays no Babinski's sign on the right side. He displays no Babinski's sign on the left side.  Reflex Scores:      Tricep reflexes are 1+ on the right side and 1+ on the left side.      Bicep reflexes are 1+ on the left side.  Brachioradialis reflexes are 1+ on the right side and 1+ on the left side.      Patellar reflexes are 1+ on the right side and 1+ on the left side.      Achilles reflexes are 1+ on the right side and 1+ on the left side.      + SLR in BLE  Skin: Skin  is warm and dry. No rash noted. He is not diaphoretic. No erythema. No pallor.  Psychiatric: He has a normal mood and affect. Judgment and thought content normal.      Lab Results  Component Value Date   WBC 8.7 02/10/2012   HGB 13.5 02/10/2012   HCT 40.8 02/10/2012   PLT 255.0 02/10/2012   GLUCOSE 91 10/01/2012   CHOL 198 02/10/2012   TRIG 68.0 02/10/2012   HDL 68.40 02/10/2012   LDLDIRECT 159.5 10/24/2011   LDLCALC 116* 02/10/2012   ALT 28 04/16/2012   AST 22 04/16/2012   NA 136 10/01/2012   K 3.9 10/01/2012   CL 101 10/01/2012   CREATININE 1.5 10/01/2012   BUN 16 10/01/2012   CO2 26 10/01/2012   TSH 0.64 08/01/2012   PSA 1.80 10/24/2011   INR 0.9 10/17/2007   HGBA1C 6.0 04/16/2012      Assessment & Plan:

## 2012-10-19 ENCOUNTER — Encounter: Payer: Self-pay | Admitting: Internal Medicine

## 2012-12-13 ENCOUNTER — Other Ambulatory Visit: Payer: Self-pay | Admitting: Internal Medicine

## 2013-04-02 ENCOUNTER — Encounter: Payer: Self-pay | Admitting: Internal Medicine

## 2013-04-02 ENCOUNTER — Ambulatory Visit (INDEPENDENT_AMBULATORY_CARE_PROVIDER_SITE_OTHER): Payer: Medicare Other | Admitting: Internal Medicine

## 2013-04-02 ENCOUNTER — Other Ambulatory Visit (INDEPENDENT_AMBULATORY_CARE_PROVIDER_SITE_OTHER): Payer: Medicare Other

## 2013-04-02 VITALS — BP 122/80 | HR 70 | Temp 97.9°F | Resp 16 | Wt 209.0 lb

## 2013-04-02 DIAGNOSIS — E785 Hyperlipidemia, unspecified: Secondary | ICD-10-CM

## 2013-04-02 DIAGNOSIS — N401 Enlarged prostate with lower urinary tract symptoms: Secondary | ICD-10-CM

## 2013-04-02 DIAGNOSIS — M545 Low back pain, unspecified: Secondary | ICD-10-CM

## 2013-04-02 DIAGNOSIS — M5126 Other intervertebral disc displacement, lumbar region: Secondary | ICD-10-CM

## 2013-04-02 DIAGNOSIS — F528 Other sexual dysfunction not due to a substance or known physiological condition: Secondary | ICD-10-CM

## 2013-04-02 DIAGNOSIS — J309 Allergic rhinitis, unspecified: Secondary | ICD-10-CM

## 2013-04-02 DIAGNOSIS — M1990 Unspecified osteoarthritis, unspecified site: Secondary | ICD-10-CM

## 2013-04-02 DIAGNOSIS — R7309 Other abnormal glucose: Secondary | ICD-10-CM

## 2013-04-02 DIAGNOSIS — E039 Hypothyroidism, unspecified: Secondary | ICD-10-CM

## 2013-04-02 DIAGNOSIS — N138 Other obstructive and reflux uropathy: Secondary | ICD-10-CM

## 2013-04-02 DIAGNOSIS — I10 Essential (primary) hypertension: Secondary | ICD-10-CM

## 2013-04-02 DIAGNOSIS — Z0000 Encounter for general adult medical examination without abnormal findings: Secondary | ICD-10-CM

## 2013-04-02 LAB — CBC WITH DIFFERENTIAL/PLATELET
Basophils Absolute: 0 10*3/uL (ref 0.0–0.1)
Basophils Relative: 0.4 % (ref 0.0–3.0)
Eosinophils Absolute: 0.3 10*3/uL (ref 0.0–0.7)
Lymphocytes Relative: 17.3 % (ref 12.0–46.0)
MCHC: 33.4 g/dL (ref 30.0–36.0)
Neutrophils Relative %: 68.7 % (ref 43.0–77.0)
RBC: 4.72 Mil/uL (ref 4.22–5.81)

## 2013-04-02 LAB — COMPREHENSIVE METABOLIC PANEL
ALT: 21 U/L (ref 0–53)
AST: 21 U/L (ref 0–37)
Albumin: 4.3 g/dL (ref 3.5–5.2)
Calcium: 9.4 mg/dL (ref 8.4–10.5)
Chloride: 100 mEq/L (ref 96–112)
Potassium: 3.7 mEq/L (ref 3.5–5.1)

## 2013-04-02 LAB — URINALYSIS, ROUTINE W REFLEX MICROSCOPIC
Bilirubin Urine: NEGATIVE
Nitrite: NEGATIVE
Total Protein, Urine: NEGATIVE
Urine Glucose: NEGATIVE
pH: 6.5 (ref 5.0–8.0)

## 2013-04-02 LAB — LIPID PANEL
Total CHOL/HDL Ratio: 5
VLDL: 19 mg/dL (ref 0.0–40.0)

## 2013-04-02 LAB — LDL CHOLESTEROL, DIRECT: Direct LDL: 180.8 mg/dL

## 2013-04-02 MED ORDER — LEVOTHYROXINE SODIUM 150 MCG PO TABS: 150.0000 ug | ORAL_TABLET | Freq: Every day | ORAL | Status: DC

## 2013-04-02 MED ORDER — CELECOXIB 200 MG PO CAPS: 200.0000 mg | ORAL_CAPSULE | Freq: Every day | ORAL | Status: DC

## 2013-04-02 MED ORDER — HYDROXYZINE HCL 10 MG PO TABS: 10.0000 mg | ORAL_TABLET | Freq: Three times a day (TID) | ORAL | Status: DC | PRN

## 2013-04-02 NOTE — Progress Notes (Signed)
Subjective:    Patient ID: Ronald Bond, male    DOB: 12-02-1945, 67 y.o.   MRN: [id number]  Hypertension This is a chronic problem. The current episode started more than 1 year ago. The problem has been gradually improving since onset. The problem is controlled. Pertinent negatives include no anxiety, blurred vision, chest pain, headaches, malaise/fatigue, neck pain, orthopnea, palpitations, peripheral edema, PND, shortness of breath or sweats. Agents associated with hypertension include NSAIDs. Past treatments include calcium channel blockers, angiotensin blockers and diuretics. The current treatment provides significant improvement. Compliance problems include exercise and diet.       Review of Systems  Constitutional: Negative.  Negative for malaise/fatigue.  HENT: Positive for congestion, rhinorrhea, sneezing and postnasal drip. Negative for hearing loss, ear pain, nosebleeds, facial swelling, neck pain, neck stiffness, tinnitus and ear discharge.   Eyes: Negative.  Negative for blurred vision.  Respiratory: Negative.  Negative for shortness of breath.   Cardiovascular: Negative.  Negative for chest pain, palpitations, orthopnea, leg swelling and PND.  Gastrointestinal: Negative.  Negative for nausea, vomiting, abdominal pain, diarrhea and constipation.  Endocrine: Negative.   Genitourinary: Negative.  Negative for dysuria, urgency, frequency, flank pain, decreased urine volume and difficulty urinating.  Musculoskeletal: Positive for arthralgias. Negative for myalgias, back pain, joint swelling and gait problem.  Skin: Negative.   Allergic/Immunologic: Negative.   Neurological: Negative.  Negative for dizziness, tremors, weakness, light-headedness and headaches.  Hematological: Negative.  Negative for adenopathy. Does not bruise/bleed easily.  Psychiatric/Behavioral: Negative.        Objective:   Physical Exam  Vitals reviewed. Constitutional: He is oriented to person, place,  and time. He appears well-developed and well-nourished. No distress.  HENT:  Head: Normocephalic and atraumatic.  Mouth/Throat: Oropharynx is clear and moist. No oropharyngeal exudate.  Eyes: Conjunctivae are normal. Right eye exhibits no discharge. Left eye exhibits no discharge. No scleral icterus.  Neck: Normal range of motion. Neck supple. No JVD present. No tracheal deviation present. No thyromegaly present.  Cardiovascular: Normal rate, regular rhythm, normal heart sounds and intact distal pulses.  Exam reveals no gallop and no friction rub.   No murmur heard. Pulmonary/Chest: Effort normal and breath sounds normal. No stridor. No respiratory distress. He has no wheezes. He has no rales. He exhibits no tenderness.  Abdominal: Soft. Bowel sounds are normal. He exhibits no distension and no mass. There is no tenderness. There is no rebound and no guarding. Hernia confirmed negative in the right inguinal area and confirmed negative in the left inguinal area.  Genitourinary: Rectum normal, prostate normal, testes normal and penis normal. Rectal exam shows no external hemorrhoid, no internal hemorrhoid, no fissure, no mass, no tenderness and anal tone normal. Guaiac negative stool. Prostate is not enlarged and not tender. Right testis shows no mass, no swelling and no tenderness. Right testis is descended. Left testis shows no mass, no swelling and no tenderness. Left testis is descended. Uncircumcised. No phimosis, paraphimosis, hypospadias, penile erythema or penile tenderness. No discharge found.  Musculoskeletal: Normal range of motion. He exhibits no edema and no tenderness.  Lymphadenopathy:    He has no cervical adenopathy.       Right: No inguinal adenopathy present.       Left: No inguinal adenopathy present.  Neurological: He is oriented to person, place, and time.  Skin: Skin is warm and dry. No rash noted. He is not diaphoretic. No erythema. No pallor.  Psychiatric: He has a normal  mood and affect.  His behavior is normal. Judgment and thought content normal.     Lab Results  Component Value Date   WBC 8.7 02/10/2012   HGB 13.5 02/10/2012   HCT 40.8 02/10/2012   PLT 255.0 02/10/2012   GLUCOSE 91 10/01/2012   CHOL 198 02/10/2012   TRIG 68.0 02/10/2012   HDL 68.40 02/10/2012   LDLDIRECT 159.5 10/24/2011   LDLCALC 116* 02/10/2012   ALT 28 04/16/2012   AST 22 04/16/2012   NA 136 10/01/2012   K 3.9 10/01/2012   CL 101 10/01/2012   CREATININE 1.5 10/01/2012   BUN 16 10/01/2012   CO2 26 10/01/2012   TSH 0.64 08/01/2012   PSA 1.80 10/24/2011   INR 0.9 10/17/2007   HGBA1C 6.0 04/16/2012       Assessment & Plan:

## 2013-04-02 NOTE — Assessment & Plan Note (Signed)
I will check his PSA today 

## 2013-04-02 NOTE — Assessment & Plan Note (Signed)
FLP today 

## 2013-04-02 NOTE — Patient Instructions (Signed)
Health Maintenance, Males A healthy lifestyle and preventative care can promote health and wellness.  Maintain regular health, dental, and eye exams.  Eat a healthy diet. Foods like vegetables, fruits, whole grains, low-fat dairy products, and lean protein foods contain the nutrients you need without too many calories. Decrease your intake of foods high in solid fats, added sugars, and salt. Get information about a proper diet from your caregiver, if necessary.  Regular physical exercise is one of the most important things you can do for your health. Most adults should get at least 150 minutes of moderate-intensity exercise (any activity that increases your heart rate and causes you to sweat) each week. In addition, most adults need muscle-strengthening exercises on 2 or more days a week.   Maintain a healthy weight. The body mass index (BMI) is a screening tool to identify possible weight problems. It provides an estimate of body fat based on height and weight. Your caregiver can help determine your BMI, and can help you achieve or maintain a healthy weight. For adults 20 years and older:  A BMI below 18.5 is considered underweight.  A BMI of 18.5 to 24.9 is normal.  A BMI of 25 to 29.9 is considered overweight.  A BMI of 30 and above is considered obese.  Maintain normal blood lipids and cholesterol by exercising and minimizing your intake of saturated fat. Eat a balanced diet with plenty of fruits and vegetables. Blood tests for lipids and cholesterol should begin at age 20 and be repeated every 5 years. If your lipid or cholesterol levels are high, you are over 50, or you are a high risk for heart disease, you may need your cholesterol levels checked more frequently.Ongoing high lipid and cholesterol levels should be treated with medicines, if diet and exercise are not effective.  If you smoke, find out from your caregiver how to quit. If you do not use tobacco, do not start.  If you  choose to drink alcohol, do not exceed 2 drinks per day. One drink is considered to be 12 ounces (355 mL) of beer, 5 ounces (148 mL) of wine, or 1.5 ounces (44 mL) of liquor.  Avoid use of street drugs. Do not share needles with anyone. Ask for help if you need support or instructions about stopping the use of drugs.  High blood pressure causes heart disease and increases the risk of stroke. Blood pressure should be checked at least every 1 to 2 years. Ongoing high blood pressure should be treated with medicines if weight loss and exercise are not effective.  If you are 45 to 67 years old, ask your caregiver if you should take aspirin to prevent heart disease.  Diabetes screening involves taking a blood sample to check your fasting blood sugar level. This should be done once every 3 years, after age 45, if you are within normal weight and without risk factors for diabetes. Testing should be considered at a younger age or be carried out more frequently if you are overweight and have at least 1 risk factor for diabetes.  Colorectal cancer can be detected and often prevented. Most routine colorectal cancer screening begins at the age of 50 and continues through age 75. However, your caregiver may recommend screening at an earlier age if you have risk factors for colon cancer. On a yearly basis, your caregiver may provide home test kits to check for hidden blood in the stool. Use of a small camera at the end of a tube,   to directly examine the colon (sigmoidoscopy or colonoscopy), can detect the earliest forms of colorectal cancer. Talk to your caregiver about this at age 50, when routine screening begins. Direct examination of the colon should be repeated every 5 to 10 years through age 75, unless early forms of pre-cancerous polyps or small growths are found.  Hepatitis C blood testing is recommended for all people born from 1945 through 1965 and any individual with known risks for hepatitis C.  Healthy  men should no longer receive prostate-specific antigen (PSA) blood tests as part of routine cancer screening. Consult with your caregiver about prostate cancer screening.  Testicular cancer screening is not recommended for adolescents or adult males who have no symptoms. Screening includes self-exam, caregiver exam, and other screening tests. Consult with your caregiver about any symptoms you have or any concerns you have about testicular cancer.  Practice safe sex. Use condoms and avoid high-risk sexual practices to reduce the spread of sexually transmitted infections (STIs).  Use sunscreen with a sun protection factor (SPF) of 30 or greater. Apply sunscreen liberally and repeatedly throughout the day. You should seek shade when your shadow is shorter than you. Protect yourself by wearing long sleeves, pants, a wide-brimmed hat, and sunglasses year round, whenever you are outdoors.  Notify your caregiver of new moles or changes in moles, especially if there is a change in shape or color. Also notify your caregiver if a mole is larger than the size of a pencil eraser.  A one-time screening for abdominal aortic aneurysm (AAA) and surgical repair of large AAAs by sound wave imaging (ultrasonography) is recommended for ages 65 to 75 years who are current or former smokers.  Stay current with your immunizations. Document Released: 05/12/2008 Document Revised: 02/06/2012 Document Reviewed: 04/11/2011 [employer] Patient Information 2013 [employer].  

## 2013-04-02 NOTE — Assessment & Plan Note (Signed)
Try hydroxyzine

## 2013-04-02 NOTE — Assessment & Plan Note (Addendum)

## 2013-04-02 NOTE — Assessment & Plan Note (Signed)
His BP is well controlled 

## 2013-04-02 NOTE — Assessment & Plan Note (Signed)
TSH is a little low so I lowered his dose

## 2013-04-02 NOTE — Assessment & Plan Note (Signed)
Continue celebrex.  

## 2013-04-03 ENCOUNTER — Encounter: Payer: Self-pay | Admitting: Internal Medicine

## 2013-04-24 ENCOUNTER — Ambulatory Visit (INDEPENDENT_AMBULATORY_CARE_PROVIDER_SITE_OTHER)
Admission: RE | Admit: 2013-04-24 | Discharge: 2013-04-24 | Disposition: A | Discharge status: Home / Self Care | Payer: Medicare Other | Source: Ambulatory Visit | Attending: Internal Medicine | Admitting: Internal Medicine

## 2013-04-24 ENCOUNTER — Encounter: Payer: Self-pay | Admitting: Internal Medicine

## 2013-04-24 ENCOUNTER — Ambulatory Visit (INDEPENDENT_AMBULATORY_CARE_PROVIDER_SITE_OTHER): Payer: Medicare Other | Admitting: Internal Medicine

## 2013-04-24 VITALS — BP 128/70 | HR 58 | Temp 98.6°F | Resp 16 | Wt 207.0 lb

## 2013-04-24 DIAGNOSIS — R059 Cough, unspecified: Secondary | ICD-10-CM

## 2013-04-24 DIAGNOSIS — R05 Cough: Secondary | ICD-10-CM

## 2013-04-24 DIAGNOSIS — J209 Acute bronchitis, unspecified: Secondary | ICD-10-CM

## 2013-04-24 DIAGNOSIS — E785 Hyperlipidemia, unspecified: Secondary | ICD-10-CM

## 2013-04-24 MED ORDER — HYDROCODONE-HOMATROPINE 5-1.5 MG/5ML PO SYRP: 5.0000 mL | ORAL_SOLUTION | Freq: Three times a day (TID) | ORAL | Status: DC | PRN

## 2013-04-24 MED ORDER — AZITHROMYCIN 500 MG PO TABS: 500.0000 mg | ORAL_TABLET | Freq: Every day | ORAL | Status: DC

## 2013-04-24 MED ORDER — EZETIMIBE-SIMVASTATIN 10-20 MG PO TABS: 1.0000 | ORAL_TABLET | Freq: Every day | ORAL | Status: DC

## 2013-04-24 NOTE — Progress Notes (Signed)
Subjective:    Patient ID: Ronald Bond, male    DOB: 04/26/1946, 67 y.o.   MRN: [id number]  Cough This is a recurrent problem. The current episode started 1 to 4 weeks ago. The problem has been unchanged. The problem occurs every few hours. The cough is productive of purulent sputum. Associated symptoms include chills, postnasal drip, rhinorrhea and sweats. Pertinent negatives include no chest pain, ear congestion, ear pain, fever, headaches, heartburn, hemoptysis, myalgias, nasal congestion, rash, sore throat, shortness of breath, weight loss or wheezing. Nothing aggravates the symptoms. He has tried nothing for the symptoms.      Review of Systems  Constitutional: Positive for chills. Negative for fever, weight loss, diaphoresis, activity change, appetite change and unexpected weight change.  HENT: Positive for rhinorrhea and postnasal drip. Negative for ear pain, sore throat, sneezing, trouble swallowing, voice change and sinus pressure.   Eyes: Negative.   Respiratory: Positive for cough. Negative for hemoptysis, shortness of breath and wheezing.   Cardiovascular: Negative.  Negative for chest pain, palpitations and leg swelling.  Gastrointestinal: Negative.  Negative for heartburn, nausea, vomiting, abdominal pain, diarrhea, constipation and blood in stool.  Endocrine: Negative.   Genitourinary: Negative.   Musculoskeletal: Negative.  Negative for myalgias.  Skin: Negative.  Negative for rash.  Allergic/Immunologic: Negative.   Neurological: Negative.  Negative for dizziness, weakness, light-headedness and headaches.  Hematological: Negative for adenopathy. Does not bruise/bleed easily.  Psychiatric/Behavioral: Negative.        Objective:   Physical Exam  Vitals reviewed. Constitutional: He is oriented to person, place, and time. He appears well-developed and well-nourished.  Non-toxic appearance. He does not have a sickly appearance. He does not appear ill. No distress.   HENT:  Head: Normocephalic and atraumatic. No trismus in the jaw.  Nose: No mucosal edema, rhinorrhea or sinus tenderness. Right sinus exhibits no maxillary sinus tenderness and no frontal sinus tenderness. Left sinus exhibits no maxillary sinus tenderness and no frontal sinus tenderness.  Mouth/Throat: Oropharynx is clear and moist and mucous membranes are normal. Mucous membranes are not pale, not dry and not cyanotic. No oral lesions. No edematous. No oropharyngeal exudate, posterior oropharyngeal edema, posterior oropharyngeal erythema or tonsillar abscesses.  Eyes: Conjunctivae are normal. Right eye exhibits no discharge. Left eye exhibits no discharge. No scleral icterus.  Neck: Normal range of motion. Neck supple. No JVD present. No tracheal deviation present. No thyromegaly present.  Cardiovascular: Normal rate, regular rhythm, normal heart sounds and intact distal pulses.  Exam reveals no gallop and no friction rub.   No murmur heard. Pulmonary/Chest: Effort normal and breath sounds normal. No stridor. No respiratory distress. He has no wheezes. He has no rales. He exhibits no tenderness.  Abdominal: Soft. Bowel sounds are normal. He exhibits no distension and no mass. There is no tenderness. There is no rebound and no guarding.  Musculoskeletal: Normal range of motion. He exhibits no edema.  Lymphadenopathy:    He has no cervical adenopathy.  Neurological: He is oriented to person, place, and time.  Skin: Skin is warm and dry. No rash noted. He is not diaphoretic. No erythema. No pallor.  Psychiatric: He has a normal mood and affect. His behavior is normal. Judgment and thought content normal.      Lab Results  Component Value Date   WBC 8.7 04/02/2013   HGB 14.0 04/02/2013   HCT 41.9 04/02/2013   PLT 295.0 04/02/2013   GLUCOSE 91 04/02/2013   CHOL 271* 04/02/2013  TRIG 95.0 04/02/2013   HDL 54.60 04/02/2013   LDLDIRECT 180.8 04/02/2013   LDLCALC 116* 02/10/2012   ALT 21 04/02/2013   AST 21  04/02/2013   NA 136 04/02/2013   K 3.7 04/02/2013   CL 100 04/02/2013   CREATININE 1.4 04/02/2013   BUN 18 04/02/2013   CO2 28 04/02/2013   TSH 0.06* 04/02/2013   PSA 1.66 04/02/2013   INR 0.9 10/17/2007   HGBA1C 6.1 04/02/2013      Assessment & Plan:

## 2013-04-24 NOTE — Assessment & Plan Note (Signed)
I will check a CXR to see if he has PNA

## 2013-04-24 NOTE — Assessment & Plan Note (Signed)
I will treat the infection with a Zpak and he will take a cough suppressant as well

## 2013-04-24 NOTE — Patient Instructions (Signed)

## 2013-04-24 NOTE — Assessment & Plan Note (Signed)
Will start Vytorin to reduce the risk of MI and CVA

## 2013-06-03 ENCOUNTER — Encounter: Payer: Self-pay | Admitting: Internal Medicine

## 2013-06-03 ENCOUNTER — Ambulatory Visit (INDEPENDENT_AMBULATORY_CARE_PROVIDER_SITE_OTHER): Payer: Medicare Other | Admitting: Internal Medicine

## 2013-06-03 VITALS — BP 128/78 | HR 68 | Temp 98.8°F | Resp 16 | Wt 208.2 lb

## 2013-06-03 DIAGNOSIS — R0789 Other chest pain: Secondary | ICD-10-CM

## 2013-06-03 DIAGNOSIS — J45901 Unspecified asthma with (acute) exacerbation: Secondary | ICD-10-CM

## 2013-06-03 DIAGNOSIS — K219 Gastro-esophageal reflux disease without esophagitis: Secondary | ICD-10-CM

## 2013-06-03 DIAGNOSIS — J4520 Mild intermittent asthma, uncomplicated: Secondary | ICD-10-CM | POA: Insufficient documentation

## 2013-06-03 DIAGNOSIS — R079 Chest pain, unspecified: Secondary | ICD-10-CM

## 2013-06-03 DIAGNOSIS — I517 Cardiomegaly: Secondary | ICD-10-CM

## 2013-06-03 DIAGNOSIS — J4521 Mild intermittent asthma with (acute) exacerbation: Secondary | ICD-10-CM

## 2013-06-03 MED ORDER — ESOMEPRAZOLE MAGNESIUM 40 MG PO CPDR: 40.0000 mg | DELAYED_RELEASE_CAPSULE | Freq: Every day | ORAL | Status: DC

## 2013-06-03 MED ORDER — MOMETASONE FURO-FORMOTEROL FUM 100-5 MCG/ACT IN AERO: 2.0000 | INHALATION_SPRAY | Freq: Two times a day (BID) | RESPIRATORY_TRACT | Status: DC

## 2013-06-03 NOTE — Assessment & Plan Note (Signed)
He has refractory GERD with a lot of complications I have asked him to stop taking the current PPI and have asked him to switch to BID nexium

## 2013-06-03 NOTE — Assessment & Plan Note (Signed)
EKG is normal I will start treating the asthma Will also treat the GERD with BID nexium as I think this is contributing to his pain and cough

## 2013-06-03 NOTE — Patient Instructions (Addendum)
Asthma, Adult Asthma is a condition that affects your lungs. It is characterized by swelling and narrowing of your airways as well as increased mucus production. The narrowing comes from swelling and muscle spasms inside the airways. When this happens, breathing can be difficult and you can have coughing, wheezing, and shortness of breath. Knowing more about asthma can help you manage it better. Asthma cannot be cured, but medicines and lifestyle changes can help control it. Asthma can be a minor problem for some people but if it is not controlled it can lead to a life-threatening asthma attack. Asthma can change over time. It is important to work with your caregiver to manage your asthma symptoms. CAUSES The exact cause of asthma is unknown. Asthma is believed to be caused by inherited (genetic) and environmental exposures. Swelling and redness (inflammation) of the airways occurs in asthma. This can be triggered by allergies, viral lung infections, or irritants in the air. Allergic reactions can cause you to wheeze immediately or several hours after an exposure. Asthma triggers are different for each person. It is important to pay attention and know what triggers your asthma.  Common triggers for asthma attacks include:  Animal dander from the skin, hair, or feathers of animals.  Dust mites contained in house dust.  Cockroaches.  Pollen from trees or grass.  Mold.  Cigarette or tobacco smoke. Smoking cannot be allowed in homes of people with asthma. People with asthma should not smoke and should not be around smokers.  Air pollutants such as dust, household cleaners, hair sprays, aerosol sprays, paint fumes, strong chemicals, or strong odors.  Cold air or weather changes. Cold air may cause inflammation. Winds increase molds and pollens in the air. There is not one best climate for people with asthma.  Strong emotions such as crying or laughing hard.  Stress.  Certain medicines such as  aspirin or beta-blockers.  Sulfites in such foods and drinks as dried fruits and wine.  Infections or inflammatory conditions such as the flu, a cold, or an inflammation of the nasal membranes (rhinitis).  Gastroesophageal reflux disease (GERD). GERD is a condition where stomach acid backs up into your throat (esophagus).  Exercise or strenous activity. Proper pre-exercise medicines allow most people to participate in sports. SYMPTOMS  Feeling short of breath.  Chest tightness or pain.  Difficulty sleeping due to coughing, wheezing, or feeling short of breath.  A whistling or wheezing sound with exhalation.  Coughing or wheezing that is worse when you:  Have a virus (such as a cold or the flu).  Are suffering from allergies.  Are exposed to certain fumes or chemicals.  Exercise. Signs that your asthma is probably getting worse include:   More frequent and bothersome asthma signs and symptoms.  Increasing difficulty breathing. This can be measured by a peak flow meter, which is a simple device used to check how well your lungs are working.  An increasingly frequent need to use a quick-relief inhaler. DIAGNOSIS  The diagnosis of asthma is made by review of your medical history, a physical exam, and possibly from other tests. Lung function studies may help with the diagnosis. TREATMENT  Asthma cannot be cured. However, for the majority of adults, asthma can be controlled with treatment. Besides avoidance of triggers of your asthma, medicines are often required. There are 2 classes of medicine used for asthma treatment: controller medicines (reduce inflammation and symptoms) andreliever or rescue medicines (relieve asthma symptoms during acute attacks). You may require daily   medicines to control your asthma. The most effective long-term controller medicines for asthma are inhaled corticosteroids (blocks inflammation). Other long-term control medicines include:  Leukotriene  receptor antagonists (blocks a pathway of inflammation).  Long-acting beta2-agonists (relaxes the muscles of the airways for at least 12 hours) with an inhaled corticosteroid.  Cromolyn sodium or nedocromil (alters certain inflammatory cells' ability to release chemicals that cause inflammation).  Immunomodulators (alters the immune system to prevent asthma symptoms).  Theophylline (relaxes muscles in the airways). You may also require a short-acting beta2-agonist to relieve asthma symptoms during an acute attack. You should understand what to do during an acute attack. Inhaled medicines are effective when used properly. Read the instructions on how to use your medicines correctly and speak to your caregiver if you have questions. Follow up with your caregiver on a regular basis to make sure your asthma is well-controlled. If your asthma is not well-controlled, if you have been hospitalized for asthma, or if multiple medicines or medium to high doses of inhaled corticosteroids are needed to control your asthma, request a referral to an asthma specialist. HOME CARE INSTRUCTIONS   Take medicines as directed by your caregiver.  Control your home environment in the following ways to help prevent asthma attacks:  Change your heating and air conditioning filter at least once a month.  Place a filter or cheesecloth over your heating and air conditioning vents.  Limit the use of fireplaces and wood stoves.  Do not smoke. Do not stay in places where others are smoking.  Get rid of pests (such as roaches and mice) and their droppings.  If you see mold on a plant, throw it away.  Clean your floors and dust every week. Use unscented cleaning products. Use a vacuum cleaner with a HEPA filter if possible. If vacuuming or cleaning triggers your asthma, try to find someone else to do these chores.  Floors in your house should be wood, tile, or vinyl. Carpet can trap dander and dust.  Use  allergy-proof pillows, mattress covers, and box spring covers.  Wash bedsheets and blankets every week in hot water and dry in a dryer.  Use a blanket that is made of polyester or cotton with a tight nap.  Do not use a dust ruffle on your bed.  Clean bathrooms and kitchens with bleach and repaint with mold-resistant paint.  Wash hands frequently.  Talk to your caregiver about an action plan for managing asthma attacks. This includes the use of a peak flow meter which measures the severity of the attack and medicines that can help stop the attack. An action plan can help minimize or stop the attack without having to seek medical care.  Remain calm during an asthma attack.  Always have a plan prepared for seeking medical attention. This should include contacting your caregiver and in the case of a severe attack, calling your local emergency services (911 in [place] SEEK MEDICAL CARE IF:   You have wheezing, shortness of breath, or a cough even if taking medicine to prevent attacks.  You have thickening of sputum.  Your sputum changes from clear or white to yellow, green, gray, or bloody.  You have any problems that may be related to the medicines you are taking (such as a rash, itching, swelling, or trouble breathing).  You are using a reliever medicine more than 2 3 times per week.  Your peak flow is still at 50 79% of personal best after following your action plan for 1   hour. SEEK IMMEDIATE MEDICAL CARE IF:   You are short of breath even at rest.  You get short of breath when doing very little physical activity.  You have difficulty eating, drinking, or talking due to asthma symptoms.  You have chest pain or you feel that your heart is beating fast.  You have a bluish color to your lips or fingernails.  You are lightheaded, dizzy, or faint.  You have a fever or persistent symptoms for more than 2 3 days.  You have a fever and symptoms suddenly get worse.  You seem to be  getting worse and are unresponsive to treatment during an asthma attack.  Your peak flow is less than 50% of personal best. MAKE SURE YOU:   Understand these instructions.  Will watch your condition.  Will get help right away if you are not doing well or get worse. Document Released: 11/14/2005 Document Revised: 10/31/2012 Document Reviewed: 07/02/2008 [employer] Patient Information 2014 [employer].  

## 2013-06-03 NOTE — Progress Notes (Signed)
Subjective:    Patient ID: Ronald Bond, male    DOB: 1946-06-26, 67 y.o.   MRN: [id number]  Gastrophageal Reflux He complains of chest pain, coughing, a hoarse voice and wheezing. He reports no abdominal pain, no belching, no choking, no dysphagia, no early satiety, no globus sensation, no heartburn, no nausea or no sore throat. This is a chronic problem. The current episode started more than 1 year ago. The problem occurs frequently. The problem has been gradually worsening. Nothing aggravates the symptoms. Associated symptoms include fatigue. Pertinent negatives include no anemia, melena, muscle weakness, orthopnea or weight loss. He has tried a PPI for the symptoms. The treatment provided moderate relief.      Review of Systems  Constitutional: Positive for fatigue. Negative for fever, chills, weight loss, diaphoresis, activity change, appetite change and unexpected weight change.  HENT: Positive for hoarse voice, rhinorrhea, voice change and postnasal drip. Negative for nosebleeds, congestion, sore throat, facial swelling, sneezing, drooling, mouth sores, trouble swallowing, sinus pressure, tinnitus and ear discharge.   Eyes: Negative.   Respiratory: Positive for cough, chest tightness and wheezing. Negative for apnea, choking, shortness of breath and stridor.   Cardiovascular: Positive for chest pain. Negative for palpitations and leg swelling.  Gastrointestinal: Negative.  Negative for heartburn, dysphagia, nausea, vomiting, abdominal pain, diarrhea, constipation, blood in stool, melena, abdominal distention, anal bleeding and rectal pain.  Endocrine: Negative.   Genitourinary: Negative.   Musculoskeletal: Negative.  Negative for muscle weakness.  Skin: Negative.   Allergic/Immunologic: Negative.   Neurological: Negative.  Negative for dizziness and light-headedness.  Hematological: Negative.   Psychiatric/Behavioral: Negative.        Objective:   Physical Exam  Vitals  reviewed. Constitutional: He is oriented to person, place, and time. He appears well-developed and well-nourished. No distress.  HENT:  Head: Normocephalic and atraumatic.  Mouth/Throat: Oropharynx is clear and moist. No oropharyngeal exudate.  Eyes: Conjunctivae are normal. Right eye exhibits no discharge. Left eye exhibits no discharge. No scleral icterus.  Neck: Normal range of motion. Neck supple. No JVD present. No tracheal deviation present. No thyromegaly present.  Cardiovascular: Normal rate, regular rhythm, normal heart sounds and intact distal pulses.  Exam reveals no gallop and no friction rub.   No murmur heard. Pulmonary/Chest: Effort normal and breath sounds normal. No stridor. No respiratory distress. He has no wheezes. He has no rales. He exhibits no tenderness.  Abdominal: Soft. Bowel sounds are normal. He exhibits no distension and no mass. There is no tenderness. There is no rebound and no guarding.  Musculoskeletal: Normal range of motion. He exhibits no edema and no tenderness.  Lymphadenopathy:    He has no cervical adenopathy.  Neurological: He is oriented to person, place, and time.  Skin: Skin is warm and dry. No rash noted. He is not diaphoretic. No erythema. No pallor.  Psychiatric: He has a normal mood and affect. His behavior is normal. Judgment and thought content normal.      Lab Results  Component Value Date   WBC 8.7 04/02/2013   HGB 14.0 04/02/2013   HCT 41.9 04/02/2013   PLT 295.0 04/02/2013   GLUCOSE 91 04/02/2013   CHOL 271* 04/02/2013   TRIG 95.0 04/02/2013   HDL 54.60 04/02/2013   LDLDIRECT 180.8 04/02/2013   LDLCALC 116* 02/10/2012   ALT 21 04/02/2013   AST 21 04/02/2013   NA 136 04/02/2013   K 3.7 04/02/2013   CL 100 04/02/2013   CREATININE 1.4 04/02/2013  BUN 18 04/02/2013   CO2 28 04/02/2013   TSH 0.06* 04/02/2013   PSA 1.66 04/02/2013   INR 0.9 10/17/2007   HGBA1C 6.1 04/02/2013      Assessment & Plan:

## 2013-06-03 NOTE — Assessment & Plan Note (Signed)
His EKG is normal He has several risk factors for CAD so I have ordered a thallium scan

## 2013-06-03 NOTE — Assessment & Plan Note (Signed)
Start dulera - I gave him a sample and showed him how to use it, he demonstrated proficiency with its use

## 2013-06-07 ENCOUNTER — Other Ambulatory Visit: Payer: Self-pay | Admitting: Internal Medicine

## 2013-06-19 ENCOUNTER — Encounter (HOSPITAL_COMMUNITY): Payer: Medicare Other

## 2013-07-01 ENCOUNTER — Encounter (HOSPITAL_COMMUNITY): Payer: Medicare Other

## 2013-07-11 ENCOUNTER — Ambulatory Visit (HOSPITAL_COMMUNITY): Payer: Medicare Other | Attending: Cardiovascular Disease | Admitting: Radiology

## 2013-07-11 VITALS — BP 162/105 | Ht 70.0 in | Wt 204.0 lb

## 2013-07-11 DIAGNOSIS — R0602 Shortness of breath: Secondary | ICD-10-CM

## 2013-07-11 DIAGNOSIS — I10 Essential (primary) hypertension: Secondary | ICD-10-CM | POA: Insufficient documentation

## 2013-07-11 DIAGNOSIS — R0789 Other chest pain: Secondary | ICD-10-CM

## 2013-07-11 DIAGNOSIS — R002 Palpitations: Secondary | ICD-10-CM | POA: Insufficient documentation

## 2013-07-11 DIAGNOSIS — R5381 Other malaise: Secondary | ICD-10-CM | POA: Insufficient documentation

## 2013-07-11 DIAGNOSIS — J45909 Unspecified asthma, uncomplicated: Secondary | ICD-10-CM | POA: Insufficient documentation

## 2013-07-11 DIAGNOSIS — R079 Chest pain, unspecified: Secondary | ICD-10-CM

## 2013-07-11 DIAGNOSIS — I428 Other cardiomyopathies: Secondary | ICD-10-CM | POA: Insufficient documentation

## 2013-07-11 DIAGNOSIS — Z8249 Family history of ischemic heart disease and other diseases of the circulatory system: Secondary | ICD-10-CM | POA: Insufficient documentation

## 2013-07-11 MED ORDER — REGADENOSON 0.4 MG/5ML IV SOLN
0.4000 mg | Freq: Once | INTRAVENOUS | Status: AC
  Administered 2013-07-11: 0.4 mg via INTRAVENOUS

## 2013-07-11 MED ORDER — TECHNETIUM TC 99M SESTAMIBI GENERIC - CARDIOLITE
30.0000 | Freq: Once | INTRAVENOUS | Status: AC | PRN
  Administered 2013-07-11: 30 via INTRAVENOUS

## 2013-07-11 MED ORDER — TECHNETIUM TC 99M SESTAMIBI GENERIC - CARDIOLITE
10.0000 | Freq: Once | INTRAVENOUS | Status: AC | PRN
  Administered 2013-07-11: 10 via INTRAVENOUS

## 2013-07-11 NOTE — Progress Notes (Signed)
[facility] SITE 3 NUCLEAR MED [address] [place], [place] [address] [phone]    Cardiology Nuclear Med Study  Ronald Bond is a 67 y.o. male     MRN : [id number]     DOB: 1946-04-03  Procedure Date: 07/11/2013  Nuclear Med Background Indication for Stress Test:  Evaluation for Ischemia History:  Asthma and Cardiomyopathy, '11 MPS NL EF: 52% 10/19/10: EF: 55-60% mild MR Cardiac Risk Factors: Family History - CAD, Hypertension and Lipids  Symptoms:  Chest Pain, Chest Tightness, Fatigue and Palpitations   Nuclear Pre-Procedure Caffeine/Decaff Intake:  None NPO After: 7:00am   Lungs:  clear O2 Sat: 99% on room air. IV 0.9% NS with Angio Cath:  22g  IV Site: R Hand  IV Started by:  Bonnita Levan, RN  Chest Size (in):  46 Cup Size: n/a  Height: 5\' 10"  (1.778 m)  Weight:  204 lb (92.534 kg)  BMI:  Body mass index is 29.27 kg/(m^2). Tech Comments:  N/A    Nuclear Med Study 1 or 2 day study: 1 day  Stress Test Type:  Stress  Reading MD: Kristeen Miss, MD  Order Authorizing Provider:  Sanda Linger, MD  Resting Radionuclide: Technetium 90m Sestamibi  Resting Radionuclide Dose: 11.0 mCi   Stress Radionuclide:  Technetium 32m Sestamibi  Stress Radionuclide Dose: 33.0 mCi           Stress Protocol Rest HR: 51 Stress HR: 72  Rest BP: 162/105 Stress BP: 137/66  Exercise Time (min): n/a METS: n/a   Predicted Max HR: 153 bpm % Max HR: 47.06 bpm Rate Pressure Product: 9864   Dose of Adenosine (mg):  n/a Dose of Lexiscan: 0.4 mg  Dose of Atropine (mg): n/a Dose of Dobutamine: n/a mcg/kg/min (at max HR)  Stress Test Technologist: Milana Na, EMT-P  Nuclear Technologist:  Doyne Keel, CNMT     Rest Procedure:  Myocardial perfusion imaging was performed at rest 45 minutes following the intravenous administration of Technetium 52m Sestamibi. Rest ECG: NSR - Normal EKG  Stress Procedure:  The patient received IV Lexiscan 0.4 mg over 15-seconds.  Technetium 38m  Sestamibi injected at 30-seconds.  This patient had sob and chest pain with the Lexiscan injection. Quantitative spect images were obtained after a 45 minute delay. Stress ECG: No significant change from baseline ECG  QPS Raw Data Images:  Normal; no motion artifact; normal heart/lung ratio. Stress Images:  Normal homogeneous uptake in all areas of the myocardium. Rest Images:  Normal homogeneous uptake in all areas of the myocardium. Subtraction (SDS):  No evidence of ischemia. Transient Ischemic Dilatation (Normal <1.22):  n/a Lung/Heart Ratio (Normal <0.45):  0.37  Quantitative Gated Spect Images QGS EDV:  150 ml QGS ESV:  80 ml  Impression Exercise Capacity:  Lexiscan with no exercise. BP Response:  Normal blood pressure response. Clinical Symptoms:  No significant symptoms noted. ECG Impression:  No significant ST segment change suggestive of ischemia. Comparison with Prior Nuclear Study: No significant change from previous study 10/13/10  Overall Impression:  Low risk stress nuclear study There is no evidence of ischemia.  The LV function is mildly depressed but is not significantly different from the previous study.    .  LV Ejection Fraction: 47%.  LV Wall Motion:  Normal Wall Motion.   [name], [name], MD, [facility] 07/11/2013, 5:28 PM Office - [phone] Pager [phone]   .

## 2013-07-12 ENCOUNTER — Encounter: Payer: Self-pay | Admitting: Internal Medicine

## 2013-07-16 ENCOUNTER — Encounter: Payer: Self-pay | Admitting: Internal Medicine

## 2013-07-16 ENCOUNTER — Ambulatory Visit (INDEPENDENT_AMBULATORY_CARE_PROVIDER_SITE_OTHER): Payer: Medicare Other | Admitting: Internal Medicine

## 2013-07-16 ENCOUNTER — Other Ambulatory Visit (INDEPENDENT_AMBULATORY_CARE_PROVIDER_SITE_OTHER): Payer: Medicare Other

## 2013-07-16 VITALS — BP 126/70 | HR 56 | Temp 98.2°F | Resp 16 | Wt 205.0 lb

## 2013-07-16 DIAGNOSIS — I10 Essential (primary) hypertension: Secondary | ICD-10-CM

## 2013-07-16 DIAGNOSIS — I517 Cardiomegaly: Secondary | ICD-10-CM

## 2013-07-16 DIAGNOSIS — E785 Hyperlipidemia, unspecified: Secondary | ICD-10-CM

## 2013-07-16 DIAGNOSIS — E039 Hypothyroidism, unspecified: Secondary | ICD-10-CM

## 2013-07-16 LAB — COMPREHENSIVE METABOLIC PANEL
Albumin: 4.1 g/dL (ref 3.5–5.2)
CO2: 27 mEq/L (ref 19–32)
Calcium: 9.1 mg/dL (ref 8.4–10.5)
Chloride: 106 mEq/L (ref 96–112)
GFR: 68.84 mL/min (ref >60.00)
Glucose, Bld: 103 mg/dL — ABNORMAL HIGH (ref 70–99)
Potassium: 3.5 mEq/L (ref 3.5–5.1)
Sodium: 137 mEq/L (ref 135–145)
Total Protein: 7.7 g/dL (ref 6.0–8.3)

## 2013-07-16 LAB — LIPID PANEL
Cholesterol: 156 mg/dL (ref 0–200)
VLDL: 16 mg/dL (ref 0.0–40.0)

## 2013-07-16 MED ORDER — LEVOTHYROXINE SODIUM 125 MCG PO TABS: 125.0000 ug | ORAL_TABLET | Freq: Every day | ORAL | Status: DC

## 2013-07-16 NOTE — Assessment & Plan Note (Signed)
He has no s/s of CHF or fluid retention

## 2013-07-16 NOTE — Patient Instructions (Signed)

## 2013-07-16 NOTE — Assessment & Plan Note (Signed)
His LDL goal has been met He is doing well on vytorin

## 2013-07-16 NOTE — Progress Notes (Signed)
  Subjective:    Patient ID: Ronald Bond, male    DOB: [birth year], 67 y.o.   MRN: [id number]  Thyroid Problem Presents for follow-up visit. Patient reports no anxiety, cold intolerance, constipation, depressed mood, diaphoresis, diarrhea, dry skin, fatigue, hair loss, heat intolerance, hoarse voice, leg swelling, nail problem, palpitations, tremors, visual change, weight gain or weight loss. The symptoms have been stable. Past treatments include levothyroxine. The treatment provided significant relief. There are no known risk factors.      Review of Systems  Constitutional: Negative.  Negative for fever, chills, weight loss, weight gain, diaphoresis, activity change, appetite change, fatigue and unexpected weight change.  HENT: Negative.  Negative for hoarse voice.   Eyes: Negative.   Respiratory: Negative.  Negative for cough, chest tightness, shortness of breath, wheezing and stridor.   Cardiovascular: Negative.  Negative for chest pain, palpitations and leg swelling.  Gastrointestinal: Negative.  Negative for nausea, abdominal pain, diarrhea and constipation.  Endocrine: Negative.  Negative for cold intolerance and heat intolerance.  Genitourinary: Negative.   Musculoskeletal: Negative.  Negative for myalgias, back pain, joint swelling, arthralgias and gait problem.  Skin: Negative.   Allergic/Immunologic: Negative.   Neurological: Negative.  Negative for dizziness, tremors, weakness and light-headedness.  Hematological: Negative.  Negative for adenopathy. Does not bruise/bleed easily.  Psychiatric/Behavioral: Negative.        Objective:   Physical Exam  Vitals reviewed. Constitutional: He is oriented to person, place, and time. He appears well-developed and well-nourished. No distress.  HENT:  Head: Normocephalic and atraumatic.  Mouth/Throat: Oropharynx is clear and moist. No oropharyngeal exudate.  Eyes: Conjunctivae are normal. Right eye exhibits no discharge. Left eye  exhibits no discharge. No scleral icterus.  Neck: Normal range of motion. Neck supple. No JVD present. No tracheal deviation present. No thyromegaly present.  Cardiovascular: Normal rate, regular rhythm, normal heart sounds and intact distal pulses.  Exam reveals no gallop and no friction rub.   No murmur heard. Pulmonary/Chest: Effort normal and breath sounds normal. No stridor. No respiratory distress. He has no wheezes. He has no rales. He exhibits no tenderness.  Abdominal: Soft. Bowel sounds are normal. He exhibits no distension and no mass. There is no tenderness. There is no rebound and no guarding.  Musculoskeletal: Normal range of motion. He exhibits no edema and no tenderness.  Lymphadenopathy:    He has no cervical adenopathy.  Neurological: He is oriented to person, place, and time.  Skin: Skin is warm and dry. No rash noted. He is not diaphoretic. No erythema. No pallor.  Psychiatric: He has a normal mood and affect. His behavior is normal. Judgment and thought content normal.     Lab Results  Component Value Date   WBC 8.7 04/02/2013   HGB 14.0 04/02/2013   HCT 41.9 04/02/2013   PLT 295.0 04/02/2013   GLUCOSE 91 04/02/2013   CHOL 271* 04/02/2013   TRIG 95.0 04/02/2013   HDL 54.60 04/02/2013   LDLDIRECT 180.8 04/02/2013   LDLCALC 116* 02/10/2012   ALT 21 04/02/2013   AST 21 04/02/2013   NA 136 04/02/2013   K 3.7 04/02/2013   CL 100 04/02/2013   CREATININE 1.4 04/02/2013   BUN 18 04/02/2013   CO2 28 04/02/2013   TSH 0.06* 04/02/2013   PSA 1.66 04/02/2013   INR 0.9 10/17/2007   HGBA1C 6.1 04/02/2013       Assessment & Plan:

## 2013-07-16 NOTE — Assessment & Plan Note (Signed)
His TSH is still suppressed so I have lowered his dose again

## 2013-07-16 NOTE — Assessment & Plan Note (Signed)
His BP is well controlled 

## 2013-08-02 ENCOUNTER — Other Ambulatory Visit: Payer: Self-pay | Admitting: Internal Medicine

## 2013-08-02 DIAGNOSIS — K219 Gastro-esophageal reflux disease without esophagitis: Secondary | ICD-10-CM | POA: Diagnosis not present

## 2013-10-03 ENCOUNTER — Other Ambulatory Visit: Payer: Self-pay

## 2013-10-21 ENCOUNTER — Ambulatory Visit (INDEPENDENT_AMBULATORY_CARE_PROVIDER_SITE_OTHER)
Admission: RE | Admit: 2013-10-21 | Discharge: 2013-10-21 | Disposition: A | Discharge status: Home / Self Care | Payer: Medicare Other | Source: Ambulatory Visit | Attending: Internal Medicine | Admitting: Internal Medicine

## 2013-10-21 ENCOUNTER — Ambulatory Visit (INDEPENDENT_AMBULATORY_CARE_PROVIDER_SITE_OTHER): Payer: Medicare Other | Admitting: Internal Medicine

## 2013-10-21 ENCOUNTER — Encounter: Payer: Self-pay | Admitting: Internal Medicine

## 2013-10-21 ENCOUNTER — Other Ambulatory Visit (INDEPENDENT_AMBULATORY_CARE_PROVIDER_SITE_OTHER): Payer: Medicare Other

## 2013-10-21 VITALS — BP 138/88 | HR 68 | Temp 98.5°F | Resp 16 | Ht 70.0 in | Wt 206.0 lb

## 2013-10-21 DIAGNOSIS — R52 Pain, unspecified: Secondary | ICD-10-CM

## 2013-10-21 DIAGNOSIS — J040 Acute laryngitis: Secondary | ICD-10-CM | POA: Insufficient documentation

## 2013-10-21 DIAGNOSIS — E785 Hyperlipidemia, unspecified: Secondary | ICD-10-CM

## 2013-10-21 DIAGNOSIS — R109 Unspecified abdominal pain: Secondary | ICD-10-CM | POA: Insufficient documentation

## 2013-10-21 DIAGNOSIS — Z23 Encounter for immunization: Secondary | ICD-10-CM

## 2013-10-21 DIAGNOSIS — E039 Hypothyroidism, unspecified: Secondary | ICD-10-CM

## 2013-10-21 LAB — CBC WITH DIFFERENTIAL/PLATELET
Basophils Absolute: 0.1 10*3/uL (ref 0.0–0.1)
Eosinophils Absolute: 0.2 10*3/uL (ref 0.0–0.7)
Lymphocytes Relative: 17.2 % (ref 12.0–46.0)
Monocytes Relative: 8.9 % (ref 3.0–12.0)
Platelets: 251 10*3/uL (ref 150.0–400.0)
RDW: 15 % — ABNORMAL HIGH (ref 11.5–14.6)

## 2013-10-21 LAB — URINALYSIS, ROUTINE W REFLEX MICROSCOPIC
Nitrite: NEGATIVE
Total Protein, Urine: NEGATIVE
Urine Glucose: NEGATIVE
pH: 6 (ref 5.0–8.0)

## 2013-10-21 LAB — COMPREHENSIVE METABOLIC PANEL
ALT: 24 U/L (ref 0–53)
AST: 23 U/L (ref 0–37)
Albumin: 4.3 g/dL (ref 3.5–5.2)
Alkaline Phosphatase: 64 U/L (ref 39–117)
Calcium: 9.2 mg/dL (ref 8.4–10.5)
Chloride: 99 mEq/L (ref 96–112)
Creatinine, Ser: 1.5 mg/dL (ref 0.4–1.5)
Potassium: 3.5 mEq/L (ref 3.5–5.1)

## 2013-10-21 LAB — AMYLASE: Amylase: 177 U/L — ABNORMAL HIGH (ref 27–131)

## 2013-10-21 LAB — LIPASE: Lipase: 29 U/L (ref 11.0–59.0)

## 2013-10-21 NOTE — Assessment & Plan Note (Signed)
ENT referral to consider laryngoscopy

## 2013-10-21 NOTE — Progress Notes (Signed)
Subjective:    Patient ID: Ronald Bond, male    DOB: [birth year], 67 y.o.   MRN: [id number]  Flank Pain This is a recurrent problem. Episode onset: for 2 weeks. The problem occurs intermittently. The problem is unchanged. Pain location: left lower flank. The quality of the pain is described as aching. The pain does not radiate. The pain is at a severity of 2/10. The pain is mild. The pain is worse during the day. The symptoms are aggravated by position. Stiffness is present all day. Associated symptoms include abdominal pain. Pertinent negatives include no bladder incontinence, bowel incontinence, chest pain, dysuria, fever, headaches, leg pain, numbness, paresis, paresthesias, pelvic pain, perianal numbness, tingling, weakness or weight loss. He has tried nothing for the symptoms. The treatment provided no relief.      Review of Systems  Constitutional: Negative.  Negative for fever, chills, weight loss, diaphoresis, activity change, appetite change, fatigue and unexpected weight change.  HENT: Positive for voice change (laryngitis for 3 months). Negative for congestion, sinus pressure, sore throat and trouble swallowing.   Eyes: Negative.   Respiratory: Negative.  Negative for cough, chest tightness, shortness of breath, wheezing and stridor.   Cardiovascular: Negative.  Negative for chest pain, palpitations and leg swelling.  Gastrointestinal: Positive for abdominal pain. Negative for nausea, vomiting, diarrhea, constipation, blood in stool, abdominal distention, anal bleeding, rectal pain and bowel incontinence.  Endocrine: Positive for polyuria. Negative for polydipsia and polyphagia.  Genitourinary: Positive for frequency and flank pain. Negative for bladder incontinence, dysuria, enuresis, difficulty urinating and pelvic pain.  Musculoskeletal: Negative.  Negative for arthralgias.  Skin: Negative.  Negative for rash.  Neurological: Negative.  Negative for tingling, weakness, numbness,  headaches and paresthesias.  Hematological: Negative.  Negative for adenopathy. Does not bruise/bleed easily.  Psychiatric/Behavioral: Negative.        Objective:   Physical Exam  Vitals reviewed. Constitutional: He is oriented to person, place, and time. He appears well-developed and well-nourished.  Non-toxic appearance. He does not have a sickly appearance. He does not appear ill. No distress.  HENT:  Head: Normocephalic and atraumatic.  Mouth/Throat: Oropharynx is clear and moist. No oropharyngeal exudate.  Eyes: Conjunctivae are normal. Right eye exhibits no discharge. Left eye exhibits no discharge. No scleral icterus.  Neck: Normal range of motion. Neck supple. No JVD present. No tracheal deviation present. No thyromegaly present.  Cardiovascular: Normal rate, regular rhythm, normal heart sounds and intact distal pulses.  Exam reveals no gallop and no friction rub.   No murmur heard. Pulmonary/Chest: Effort normal and breath sounds normal. No stridor. No respiratory distress. He has no wheezes. He has no rales. He exhibits no tenderness.  Abdominal: Soft. Bowel sounds are normal. He exhibits no distension and no mass. There is no tenderness. There is no rebound and no guarding. Hernia confirmed negative in the right inguinal area and confirmed negative in the left inguinal area.  Genitourinary: Rectum normal, prostate normal, testes normal and penis normal. Rectal exam shows no external hemorrhoid, no internal hemorrhoid, no fissure, no mass and no tenderness. Guaiac negative stool. Prostate is not enlarged and not tender. Right testis shows no mass, no swelling and no tenderness. Right testis is descended. Left testis shows no mass, no swelling and no tenderness. Left testis is descended. Uncircumcised. No phimosis, paraphimosis, hypospadias, penile erythema or penile tenderness. No discharge found.  Musculoskeletal: Normal range of motion. He exhibits no edema and no tenderness.   Lymphadenopathy:    He has  no cervical adenopathy.       Right: No inguinal adenopathy present.       Left: No inguinal adenopathy present.  Neurological: He is oriented to person, place, and time.  Skin: Skin is warm and dry. No rash noted. He is not diaphoretic. No erythema. No pallor.  Psychiatric: He has a normal mood and affect. His behavior is normal. Judgment and thought content normal.     Lab Results  Component Value Date   WBC 8.7 04/02/2013   HGB 14.0 04/02/2013   HCT 41.9 04/02/2013   PLT 295.0 04/02/2013   GLUCOSE 103* 07/16/2013   CHOL 156 07/16/2013   TRIG 80.0 07/16/2013   HDL 54.70 07/16/2013   LDLDIRECT 180.8 04/02/2013   LDLCALC 85 07/16/2013   ALT 25 07/16/2013   AST 25 07/16/2013   NA 137 07/16/2013   K 3.5 07/16/2013   CL 106 07/16/2013   CREATININE 1.3 07/16/2013   BUN 17 07/16/2013   CO2 27 07/16/2013   TSH 0.15* 07/16/2013   PSA 1.66 04/02/2013   INR 0.9 10/17/2007   HGBA1C 6.1 04/02/2013       Assessment & Plan:

## 2013-10-21 NOTE — Progress Notes (Signed)
Pre visit review using our clinic review tool, if applicable. No additional management support is needed unless otherwise documented below in the visit note. 

## 2013-10-21 NOTE — Patient Instructions (Signed)

## 2013-10-21 NOTE — Assessment & Plan Note (Signed)
I do not see an acute abd process Today I will check a plain film to see if there is a stone, stool retention, SBO, ileus, free air Will also check his labs to look for infection, renal stone, pancreatitis, hepatitis, UTI, etc.

## 2013-10-22 ENCOUNTER — Encounter: Payer: Self-pay | Admitting: Internal Medicine

## 2013-11-07 NOTE — H&P (Signed)
Assessment  Esophageal reflux (530.81) (K21.9). Hoarseness (784.42) (R49.0). Discussed  Anterior left true vocal cord granuloma. Recommend micro-laryngoscopy with excision. He appears to be benign. He is at low risk for head and neck cancer. This should help restore his normal voice although there is always a chance of abnormal healing or scarring. He is very interested in pursuing this. Reason For Visit  Ronald Bond is here today at the kind request of Sanda Linger for consultation and opinion for laryngitis. HPI  He has been hoarse for about 2 months after an upper respiratory infection. He had some problems several years ago and was treated by Dr. Gerilyn Pilgrim is not clear on the exact nature of the problem. He has a little trouble swallowing as well. He's been treated for reflux with PPI for years. He drinks one cup of coffee each day has no other risk factors. Allergies  Lisinopril TABS. Current Meds  Aspirin Low Dose 81 MG Oral Tablet;; RPT HydrOXYzine HCl TABS;; RPT Levothyroxine Sodium TABS;; RPT Nasonex SUSP;; RPT CeleBREX CAPS;; RPT NexIUM 40 MG Oral Capsule Delayed Release;; RPT Potassium Chloride TBCR;; RPT Vytorin TABS;; RPT Align CAPS;; RPT Exforge HCT TABS;; RPT Dulera AERO;; RPT. Active Problems  Asthma   (493.90) (J45.909) Heartburn   (787.1) (R12) Hypercholesteremia   (272.0) (E78.0) Hypertension   (401.9) (I10) Hyperthyroidism   (242.90) (E05.90). PSH  Cataract Surgery Knee Surgery Oral Surgery Tooth Extraction Thyroid Surgery Tonsillectomy. Family Hx  Family history of cardiac disorder: Father,Brother (V17.49) (Z82.49) Family history of hypertension: Brother (V17.49) (Z82.49) Family history of malignant neoplasm: Mother (V16.9) (Z80.9) Seasonal allergies (J30.2) Stroke syndrome: Brother (I67.89). Personal Hx  Alcohol use; 2 drinks day or fewer Caffeine use (V49.89) (F15.929); 1 cup daily Never smoker (V49.89) (Z78.9) Non-smoker (V49.89) (Z78.9). ROS   Systemic: Not feeling tired (fatigue).  No fever, no night sweats, and no recent weight loss. Head: No headache. Eyes: No eye symptoms. Otolaryngeal: No hearing loss, no earache, and no tinnitus.  Purulent nasal discharge.  No nasal passage blockage (stuffiness).  Snoring.  No sneezing.  Hoarseness.  No sore throat. Cardiovascular: No chest pain or discomfort  and no palpitations. Pulmonary: No dyspnea, no cough, and no wheezing. Gastrointestinal: No dysphagia  and no heartburn.  No nausea, no abdominal pain, and no melena.  No diarrhea. Genitourinary: No dysuria. Endocrine: No muscle weakness. Musculoskeletal: No calf muscle cramps, no arthralgias, and no soft tissue swelling. Neurological: No dizziness, no fainting, no tingling, and no numbness. Psychological: No anxiety  and no depression. Skin: No rash. 12 system ROS was obtained and reviewed on the Health Maintenance form dated today.  Positive responses are shown above.  If the symptom is not checked, the patient has denied it. Vital Signs   Recorded by Skolimowski,Sharon on 29 Oct 2013 01:08 PM BP:140/80  Recorded by Mason Jim on 29 Oct 2013 12:58 PM Height: 5 ft 10 in, Weight: 206 lb , BMI: 29.6 kg/m2,  BMI Calculated: 29.56 ,  BSA Calculated: 2.11. Physical Exam  APPEARANCE: Well developed, well nourished, in no acute distress.  Normal affect, in a pleasant mood.  Oriented to time, place and person. COMMUNICATION: Hoarse voice   HEAD & FACE:  No scars, lesions or masses of head and face.  Sinuses nontender to palpation.  Salivary glands without mass or tenderness.  Facial strength symmetric.  No facial lesion, scars, or mass. EYES: EOMI with normal primary gaze alignment. Visual acuity grossly intact.  PERRLA EXTERNAL EAR & NOSE: No scars, lesions or  masses  EAC & TYMPANIC MEMBRANE:  EAC shows no obstructing lesions or debris and tympanic membranes are normal bilaterally with good movement to insufflation. GROSS  HEARING: Normal   TMJ:  Nontender  INTRANASAL EXAM: No polyps or purulence.  NASOPHARYNX: Normal, without lesions. LIPS, TEETH & GUMS: No lip lesions, normal dentition and normal gums. ORAL CAVITY/OROPHARYNX:  Oral mucosa moist without lesion or asymmetry of the palate, tongue, tonsil or posterior pharynx. Larynx is normal on indirect exam but I am unable to visualize the anterior 15-20% of the cords. NECK:  Supple without adenopathy or mass. THYROID:  Normal with no masses palpable.  NEUROLOGIC:  No gross CN deficits. No nystagmus noted.   LYMPHATIC:  No enlarged nodes palpable. Procedure  Fiberoptic Laryngoscopy Name: Neymar Dowe     Age: 67 year     The risks and benefits of this procedure have been thoroughly discussed with the patient.  The most commons risks outlined included but were not limited to: injury  to the nasal mucosa or throat irritation.  The patient was further informed that there are other less common risks.  The patient was given the opportunity to ask questions and all such questions were answered to the patient's satisfaction.  Patient acknowledged the risks and has agreed to proceed.   Performing Provider: Serena Colonel The risks of the procedure are minimal and were discussed with the patient today. Pre-op Diagnosis: hoarseness  Post-op Diagnosis: lesion  /L  appears to be granuloma. Allergy:  reviewed allergies as listed Nasal Prep:   Procedure:     With the patient seated in the exam chair, the R nasal cavity was intubated with the flexible laryngoscope.  The nasal cavity mucosa, nasopharynx, hypopharynx and larynx were all examined with findings as noted.  The scope was then removed.  The patient tolerated the procedure well without complication or blood loss (unless indicated in findings).   FINDINGS: Nasal mucosa: NPR Nasopharynx: NPR Hypopharynx: NPR Larynx: NPR except lesion: left anterior true vocal cord granuloma.    Diagnostic and therapeutic trial  of a(n). Signature  Electronically signed by : Serena Colonel  M.D.; 10/29/2013 1:41 PM EST.

## 2013-11-12 ENCOUNTER — Encounter (HOSPITAL_BASED_OUTPATIENT_CLINIC_OR_DEPARTMENT_OTHER): Payer: Self-pay | Admitting: *Deleted

## 2013-11-12 ENCOUNTER — Other Ambulatory Visit: Payer: Self-pay | Admitting: Internal Medicine

## 2013-11-12 DIAGNOSIS — G4730 Sleep apnea, unspecified: Secondary | ICD-10-CM

## 2013-11-12 DIAGNOSIS — G4733 Obstructive sleep apnea (adult) (pediatric): Secondary | ICD-10-CM | POA: Insufficient documentation

## 2013-11-12 NOTE — Progress Notes (Signed)
11/12/13 1537  OBSTRUCTIVE SLEEP APNEA  Have you ever been diagnosed with sleep apnea through a sleep study? Yes (mild-2003)  If yes, do you have and use a CPAP or BPAP machine every night? 0  Do you snore loudly (loud enough to be heard through closed doors)?  1  Do you often feel tired, fatigued, or sleepy during the daytime? 0  Has anyone observed you stop breathing during your sleep? 0  Do you have, or are you being treated for high blood pressure? 1  BMI more than 35 kg/m2? 0  Age over 49 years old? 1  Gender: 1  Obstructive Sleep Apnea Score 4  Score 4 or greater  Results sent to PCP (pt had a sleep study 10 yr ago)

## 2013-11-12 NOTE — Progress Notes (Signed)
Had cardiac eval 7/14-stress test-ekg-labs done cbc cmet-10/21/13

## 2013-11-18 ENCOUNTER — Encounter (HOSPITAL_BASED_OUTPATIENT_CLINIC_OR_DEPARTMENT_OTHER): Payer: Self-pay

## 2013-11-18 ENCOUNTER — Ambulatory Visit (HOSPITAL_BASED_OUTPATIENT_CLINIC_OR_DEPARTMENT_OTHER): Payer: Medicare Other | Admitting: Anesthesiology

## 2013-11-18 ENCOUNTER — Ambulatory Visit (HOSPITAL_BASED_OUTPATIENT_CLINIC_OR_DEPARTMENT_OTHER)
Admission: RE | Admit: 2013-11-18 | Discharge: 2013-11-18 | Disposition: A | Discharge status: Home / Self Care | Payer: Medicare Other | Source: Ambulatory Visit | Attending: Otolaryngology | Admitting: Otolaryngology

## 2013-11-18 ENCOUNTER — Encounter (HOSPITAL_BASED_OUTPATIENT_CLINIC_OR_DEPARTMENT_OTHER)
Admission: RE | Disposition: A | Discharge status: Home / Self Care | Payer: Self-pay | Source: Ambulatory Visit | Attending: Otolaryngology

## 2013-11-18 ENCOUNTER — Encounter (HOSPITAL_BASED_OUTPATIENT_CLINIC_OR_DEPARTMENT_OTHER): Payer: Medicare Other | Admitting: Anesthesiology

## 2013-11-18 DIAGNOSIS — R490 Dysphonia: Secondary | ICD-10-CM | POA: Insufficient documentation

## 2013-11-18 DIAGNOSIS — J383 Other diseases of vocal cords: Secondary | ICD-10-CM | POA: Insufficient documentation

## 2013-11-18 HISTORY — PX: MICROLARYNGOSCOPY: SHX5208

## 2013-11-18 HISTORY — DX: Presence of spectacles and contact lenses: Z97.3

## 2013-11-18 LAB — POCT HEMOGLOBIN-HEMACUE: Hemoglobin: 14.6 g/dL (ref 13.0–17.0)

## 2013-11-18 SURGERY — MICROLARYNGOSCOPY
Anesthesia: General | Site: Throat | Laterality: Left

## 2013-11-18 MED ORDER — LIDOCAINE HCL (CARDIAC) 20 MG/ML IV SOLN
INTRAVENOUS | Status: DC | PRN
  Administered 2013-11-18: 100 mg via INTRAVENOUS

## 2013-11-18 MED ORDER — MIDAZOLAM HCL 2 MG/2ML IJ SOLN: 1.0000 mg | INTRAMUSCULAR | Status: DC | PRN

## 2013-11-18 MED ORDER — FENTANYL CITRATE 0.05 MG/ML IJ SOLN: 50.0000 ug | INTRAMUSCULAR | Status: DC | PRN

## 2013-11-18 MED ORDER — DEXAMETHASONE SODIUM PHOSPHATE 4 MG/ML IJ SOLN
INTRAMUSCULAR | Status: DC | PRN
  Administered 2013-11-18: 10 mg via INTRAVENOUS

## 2013-11-18 MED ORDER — LIDOCAINE-EPINEPHRINE 1 %-1:100000 IJ SOLN
INTRAMUSCULAR | Status: AC
  Filled 2013-11-18: qty 1

## 2013-11-18 MED ORDER — MIDAZOLAM HCL 2 MG/2ML IJ SOLN
INTRAMUSCULAR | Status: AC
  Filled 2013-11-18: qty 2

## 2013-11-18 MED ORDER — EPINEPHRINE HCL 1 MG/ML IJ SOLN
INTRAMUSCULAR | Status: DC | PRN
  Administered 2013-11-18: 1 mg

## 2013-11-18 MED ORDER — MIDAZOLAM HCL 5 MG/5ML IJ SOLN
INTRAMUSCULAR | Status: DC | PRN
  Administered 2013-11-18: 2 mg via INTRAVENOUS

## 2013-11-18 MED ORDER — EPINEPHRINE HCL 1 MG/ML IJ SOLN
INTRAMUSCULAR | Status: AC
  Filled 2013-11-18: qty 1

## 2013-11-18 MED ORDER — ONDANSETRON HCL 4 MG/2ML IJ SOLN
INTRAMUSCULAR | Status: DC | PRN
  Administered 2013-11-18: 4 mg via INTRAVENOUS

## 2013-11-18 MED ORDER — SUCCINYLCHOLINE CHLORIDE 20 MG/ML IJ SOLN
INTRAMUSCULAR | Status: DC | PRN
  Administered 2013-11-18: 100 mg via INTRAVENOUS

## 2013-11-18 MED ORDER — FENTANYL CITRATE 0.05 MG/ML IJ SOLN: 50.0000 ug | Freq: Once | INTRAMUSCULAR | Status: DC

## 2013-11-18 MED ORDER — METHYLENE BLUE 1 % INJ SOLN
INTRAMUSCULAR | Status: AC
  Filled 2013-11-18: qty 10

## 2013-11-18 MED ORDER — LACTATED RINGERS IV SOLN
INTRAVENOUS | Status: DC
  Administered 2013-11-18 (×2): via INTRAVENOUS

## 2013-11-18 MED ORDER — OXYCODONE HCL 5 MG PO TABS: 5.0000 mg | ORAL_TABLET | Freq: Once | ORAL | Status: DC | PRN

## 2013-11-18 MED ORDER — PROMETHAZINE HCL 25 MG/ML IJ SOLN: 6.2500 mg | INTRAMUSCULAR | Status: DC | PRN

## 2013-11-18 MED ORDER — HYDROMORPHONE HCL PF 1 MG/ML IJ SOLN: 0.2500 mg | INTRAMUSCULAR | Status: DC | PRN

## 2013-11-18 MED ORDER — OXYCODONE HCL 5 MG/5ML PO SOLN: 5.0000 mg | Freq: Once | ORAL | Status: DC | PRN

## 2013-11-18 MED ORDER — PROPOFOL 10 MG/ML IV BOLUS
INTRAVENOUS | Status: AC
  Filled 2013-11-18: qty 20

## 2013-11-18 MED ORDER — FENTANYL CITRATE 0.05 MG/ML IJ SOLN
INTRAMUSCULAR | Status: DC | PRN
  Administered 2013-11-18: 100 ug via INTRAVENOUS

## 2013-11-18 MED ORDER — FENTANYL CITRATE 0.05 MG/ML IJ SOLN
INTRAMUSCULAR | Status: AC
  Filled 2013-11-18: qty 4

## 2013-11-18 MED ORDER — PROPOFOL 10 MG/ML IV BOLUS
INTRAVENOUS | Status: DC | PRN
  Administered 2013-11-18: 200 mg via INTRAVENOUS

## 2013-11-18 MED ORDER — MINERAL OIL LIGHT 100 % EX OIL
TOPICAL_OIL | CUTANEOUS | Status: AC
  Filled 2013-11-18: qty 25

## 2013-11-18 SURGICAL SUPPLY — 24 items
CANISTER SUCT 1200ML W/VALVE (MISCELLANEOUS) ×2 IMPLANT
GLOVE BIO SURGEON STRL SZ 6.5 (GLOVE) ×1 IMPLANT
GLOVE ECLIPSE 7.5 STRL STRAW (GLOVE) ×2 IMPLANT
GOWN PREVENTION PLUS XLARGE (GOWN DISPOSABLE) ×1 IMPLANT
GOWN PREVENTION PLUS XXLARGE (GOWN DISPOSABLE) IMPLANT
GUARD TEETH (MISCELLANEOUS) ×1 IMPLANT
MARKER SKIN DUAL TIP RULER LAB (MISCELLANEOUS) IMPLANT
NDL HYPO 18GX1.5 BLUNT FILL (NEEDLE) ×1 IMPLANT
NDL SPNL 22GX7 QUINCKE BK (NEEDLE) IMPLANT
NEEDLE HYPO 18GX1.5 BLUNT FILL (NEEDLE) ×2 IMPLANT
NEEDLE SPNL 22GX7 QUINCKE BK (NEEDLE) IMPLANT
NS IRRIG 1000ML POUR BTL (IV SOLUTION) ×2 IMPLANT
PATTIES SURGICAL .5 X3 (DISPOSABLE) ×2 IMPLANT
REDUCTION FITTING 1/4 IN (FILTER) IMPLANT
SHEET MEDIUM DRAPE 40X70 STRL (DRAPES) ×2 IMPLANT
SLEEVE SCD COMPRESS KNEE MED (MISCELLANEOUS) ×1 IMPLANT
SOLUTION BUTLER CLEAR DIP (MISCELLANEOUS) ×2 IMPLANT
SPONGE GAUZE 4X4 12PLY STER LF (GAUZE/BANDAGES/DRESSINGS) ×4 IMPLANT
SURGILUBE 2OZ TUBE FLIPTOP (MISCELLANEOUS) IMPLANT
SYR 5ML LL (SYRINGE) ×2 IMPLANT
SYR CONTROL 10ML LL (SYRINGE) ×1 IMPLANT
SYR TB 1ML LL NO SAFETY (SYRINGE) ×1 IMPLANT
TOWEL OR 17X24 6PK STRL BLUE (TOWEL DISPOSABLE) ×2 IMPLANT
TUBE CONNECTING 20X1/4 (TUBING) ×2 IMPLANT

## 2013-11-18 NOTE — Interval H&P Note (Signed)
History and Physical Interval Note:  11/18/2013 8:11 AM  [name]  has presented today for surgery, with the diagnosis of VOCAL CORD GRANULOMA  The various methods of treatment have been discussed with the patient and family. After consideration of risks, benefits and other options for treatment, the patient has consented to  Procedure(s): MICROLARYNGOSCOPY WITH REMOVAL OF GRANULOMA LEFT SIDE (Left) as a surgical intervention .  The patient's history has been reviewed, patient examined, no change in status, stable for surgery.  I have reviewed the patient's chart and labs.  Questions were answered to the patient's satisfaction.     Ronald Bond

## 2013-11-18 NOTE — Anesthesia Preprocedure Evaluation (Signed)
Anesthesia Evaluation  Patient identified by MRN, date of birth, ID band Patient awake    Reviewed: Allergy & Precautions, H&P , NPO status , Patient's Chart, lab work & pertinent test results  Airway Mallampati: II TM Distance: >3 FB     Dental   Pulmonary asthma , sleep apnea ,  breath sounds clear to auscultation        Cardiovascular hypertension, Rhythm:Regular Rate:Normal     Neuro/Psych  Neuromuscular disease    GI/Hepatic GERD-  Medicated and Controlled,  Endo/Other  Hypothyroidism   Renal/GU      Musculoskeletal   Abdominal   Peds  Hematology   Anesthesia Other Findings   Reproductive/Obstetrics                           Anesthesia Physical Anesthesia Plan  ASA: II  Anesthesia Plan: General   Post-op Pain Management:    Induction: Intravenous  Airway Management Planned: Oral ETT  Additional Equipment:   Intra-op Plan:   Post-operative Plan: Extubation in OR  Informed Consent: I have reviewed the patients History and Physical, chart, labs and discussed the procedure including the risks, benefits and alternatives for the proposed anesthesia with the patient or authorized representative who has indicated his/her understanding and acceptance.     Plan Discussed with: CRNA and Surgeon  Anesthesia Plan Comments:         Anesthesia Quick Evaluation

## 2013-11-18 NOTE — Transfer of Care (Signed)
Immediate Anesthesia Transfer of Care Note  Patient: Ronald Bond  Procedure(s) Performed: Procedure(s): MICROLARYNGOSCOPY WITH REMOVAL OF GRANULOMA LEFT SIDE (Left)  Patient Location: PACU  Anesthesia Type:General  Level of Consciousness: awake and patient cooperative  Airway & Oxygen Therapy: Patient Spontanous Breathing and aerosol face mask  Post-op Assessment: Report given to PACU RN and Post -op Vital signs reviewed and stable  Post vital signs: Reviewed and stable  Complications: No apparent anesthesia complications

## 2013-11-18 NOTE — Anesthesia Postprocedure Evaluation (Signed)
  Anesthesia Post-op Note  Patient: Ronald Bond  Procedure(s) Performed: Procedure(s): MICROLARYNGOSCOPY WITH REMOVAL OF GRANULOMA LEFT SIDE (Left)  Patient Location: PACU  Anesthesia Type:General  Level of Consciousness: awake  Airway and Oxygen Therapy: Patient Spontanous Breathing  Post-op Pain: mild  Post-op Assessment: Post-op Vital signs reviewed, Patient's Cardiovascular Status Stable, Respiratory Function Stable, Patent Airway, No signs of Nausea or vomiting and Pain level controlled  Post-op Vital Signs: Reviewed and stable  Complications: No apparent anesthesia complications

## 2013-11-18 NOTE — Anesthesia Procedure Notes (Signed)
Procedure Name: Intubation Performed by: Lance Coon Pre-anesthesia Checklist: Patient identified, Emergency Drugs available, Suction available and Patient being monitored Patient Re-evaluated:Patient Re-evaluated prior to inductionOxygen Delivery Method: Circle System Utilized Preoxygenation: Pre-oxygenation with 100% oxygen Intubation Type: IV induction Ventilation: Mask ventilation without difficulty Laryngoscope Size: Mac and 3 Grade View: Grade I Tube type: Oral Tube size: 6.0 mm Number of attempts: 1 Airway Equipment and Method: stylet and oral airway Placement Confirmation: ETT inserted through vocal cords under direct vision,  positive ETCO2 and breath sounds checked- equal and bilateral Tube secured with: Tape Dental Injury: Teeth and Oropharynx as per pre-operative assessment

## 2013-11-18 NOTE — Op Note (Signed)
OPERATIVE REPORT  DATE OF SURGERY: 11/18/2013  PATIENT:  Ronald Bond,  67 y.o. male  PRE-OPERATIVE DIAGNOSIS:  VOCAL CORD GRANULOMA  POST-OPERATIVE DIAGNOSIS:  VOCAL CORD GRANULOMA  PROCEDURE:  Procedure(s): MICROLARYNGOSCOPY WITH REMOVAL OF GRANULOMA LEFT SIDE  SURGEON:  Susy Frizzle, MD  ASSISTANTS: None  ANESTHESIA:   General   EBL:  0 ml  DRAINS: None   LOCAL MEDICATIONS USED:  None  SPECIMEN:  Left vocal cord lesion  COUNTS:  Correct  PROCEDURE DETAILS: The patient was taken to the operating room and placed on the operating table in the supine position. Following induction of general endotracheal anesthesia, the table was turned 90. Patient was draped in a standard fashion for an upper endoscopy. Maxillary tooth protector was used throughout the case. An anterior commissure scope was entered into the oral cavity and used to inspect the larynx. The scope was attached to the [employer] stand with the suspension apparatus. The larynx was inspected and the lesion was identified arising from the anterior left vocal cord, membranous surface inferiorly. The lesion was removed using up-biting scissors and cup forceps. Topical adrenaline was applied on pledgets for hemostasis. There is no bleeding. There is adequate mucosa remaining for good healing. The scope was removed and the patient was then awakened, extubated and transferred to recovery in stable condition.    PATIENT DISPOSITION:  To PACU, stable

## 2013-11-19 ENCOUNTER — Encounter (HOSPITAL_BASED_OUTPATIENT_CLINIC_OR_DEPARTMENT_OTHER): Payer: Self-pay | Admitting: Otolaryngology

## 2013-12-19 ENCOUNTER — Telehealth: Payer: Self-pay

## 2013-12-19 ENCOUNTER — Encounter: Payer: Self-pay | Admitting: Internal Medicine

## 2013-12-19 ENCOUNTER — Ambulatory Visit (INDEPENDENT_AMBULATORY_CARE_PROVIDER_SITE_OTHER): Payer: MEDICARE | Admitting: Internal Medicine

## 2013-12-19 ENCOUNTER — Other Ambulatory Visit (INDEPENDENT_AMBULATORY_CARE_PROVIDER_SITE_OTHER): Payer: MEDICARE

## 2013-12-19 VITALS — BP 140/78 | HR 50 | Temp 97.2°F | Resp 16 | Ht 70.0 in | Wt 219.2 lb

## 2013-12-19 DIAGNOSIS — I10 Essential (primary) hypertension: Secondary | ICD-10-CM | POA: Diagnosis not present

## 2013-12-19 DIAGNOSIS — E039 Hypothyroidism, unspecified: Secondary | ICD-10-CM

## 2013-12-19 LAB — BASIC METABOLIC PANEL
BUN: 13 mg/dL (ref 6–23)
CO2: 29 meq/L (ref 19–32)
CREATININE: 1.4 mg/dL (ref 0.4–1.5)
Calcium: 9 mg/dL (ref 8.4–10.5)
Chloride: 103 mEq/L (ref 96–112)
GFR: 65.34 mL/min (ref >60.00)
GLUCOSE: 102 mg/dL — AB (ref 70–99)
Potassium: 3.6 mEq/L (ref 3.5–5.1)
Sodium: 138 mEq/L (ref 135–145)

## 2013-12-19 LAB — TSH: TSH: 3.61 u[IU]/mL (ref 0.35–5.50)

## 2013-12-19 MED ORDER — LOSARTAN POTASSIUM 100 MG PO TABS: 100.0000 mg | ORAL_TABLET | Freq: Every day | ORAL | Status: DC

## 2013-12-19 MED ORDER — AMLODIPINE BESYLATE 10 MG PO TABS: 10.0000 mg | ORAL_TABLET | Freq: Every day | ORAL | Status: DC

## 2013-12-19 MED ORDER — HYDROCHLOROTHIAZIDE 25 MG PO TABS: 25.0000 mg | ORAL_TABLET | Freq: Every day | ORAL | Status: DC

## 2013-12-19 NOTE — Patient Instructions (Signed)

## 2013-12-19 NOTE — Telephone Encounter (Signed)
Received fax from pharmacy stating copay for exforge is expensive. Patient would like to have separate scripts for the three different medications, however valsartan is not covered. ARB will need to be losartan or Irbesartan. Thanks

## 2013-12-19 NOTE — Progress Notes (Signed)
Pre visit review using our clinic review tool, if applicable. No additional management support is needed unless otherwise documented below in the visit note. 

## 2013-12-19 NOTE — Assessment & Plan Note (Signed)
His TSH is in the normal range so he will stay on the same dose for now

## 2013-12-19 NOTE — Progress Notes (Signed)
   Subjective:    Patient ID: Ronald Bond, male    DOB: [birth year], 68 y.o.   MRN: [id number]  Thyroid Problem Presents for follow-up visit. Patient reports no anxiety, cold intolerance, constipation, depressed mood, diaphoresis, diarrhea, dry skin, fatigue, hair loss, heat intolerance, hoarse voice, leg swelling, nail problem, palpitations, tremors, visual change, weight gain or weight loss. Past treatments include levothyroxine. The treatment provided significant relief.      Review of Systems  Constitutional: Negative.  Negative for fever, chills, weight loss, weight gain, diaphoresis, appetite change and fatigue.  HENT: Negative.  Negative for hoarse voice.   Eyes: Negative.   Respiratory: Negative.  Negative for cough, chest tightness, shortness of breath, wheezing and stridor.   Cardiovascular: Negative.  Negative for chest pain, palpitations and leg swelling.  Gastrointestinal: Negative.  Negative for nausea, vomiting, abdominal pain, diarrhea, constipation and blood in stool.  Endocrine: Negative.  Negative for cold intolerance and heat intolerance.  Genitourinary: Negative.   Musculoskeletal: Negative.  Negative for arthralgias, gait problem, joint swelling, myalgias and neck stiffness.  Skin: Negative.   Allergic/Immunologic: Negative.   Neurological: Negative.  Negative for tremors.  Hematological: Negative.  Negative for adenopathy. Does not bruise/bleed easily.  Psychiatric/Behavioral: Negative.        Objective:   Physical Exam  Vitals reviewed. Constitutional: He is oriented to person, place, and time. He appears well-developed and well-nourished. No distress.  HENT:  Head: Normocephalic and atraumatic.  Mouth/Throat: Oropharynx is clear and moist. No oropharyngeal exudate.  Eyes: Conjunctivae are normal. Right eye exhibits no discharge. Left eye exhibits no discharge. No scleral icterus.  Neck: Normal range of motion. Neck supple. No JVD present. No tracheal  deviation present. No thyromegaly present.  Cardiovascular: Normal rate, regular rhythm, normal heart sounds and intact distal pulses.  Exam reveals no gallop and no friction rub.   No murmur heard. Pulmonary/Chest: Effort normal and breath sounds normal. No stridor. No respiratory distress. He has no wheezes. He has no rales. He exhibits no tenderness.  Abdominal: Soft. Bowel sounds are normal. He exhibits no distension and no mass. There is no tenderness. There is no rebound and no guarding.  Musculoskeletal: Normal range of motion. He exhibits no edema and no tenderness.  Lymphadenopathy:    He has no cervical adenopathy.  Neurological: He is oriented to person, place, and time.  Skin: Skin is warm and dry. No rash noted. He is not diaphoretic. No erythema. No pallor.  Psychiatric: He has a normal mood and affect. His behavior is normal. Judgment and thought content normal.     Lab Results  Component Value Date   WBC 10.1 10/21/2013   HGB 14.6 11/18/2013   HCT 41.1 10/21/2013   PLT 251.0 10/21/2013   GLUCOSE 86 10/21/2013   CHOL 156 07/16/2013   TRIG 80.0 07/16/2013   HDL 54.70 07/16/2013   LDLDIRECT 180.8 04/02/2013   LDLCALC 85 07/16/2013   ALT 24 10/21/2013   AST 23 10/21/2013   NA 137 10/21/2013   K 3.5 10/21/2013   CL 99 10/21/2013   CREATININE 1.5 10/21/2013   BUN 14 10/21/2013   CO2 29 10/21/2013   TSH 7.76* 10/21/2013   PSA 1.66 04/02/2013   INR 0.9 10/17/2007   HGBA1C 6.1 04/02/2013       Assessment & Plan:

## 2013-12-19 NOTE — Assessment & Plan Note (Signed)
His BP is well controlled Lytes and renal function are normal 

## 2013-12-20 ENCOUNTER — Encounter: Payer: Self-pay | Admitting: Internal Medicine

## 2013-12-20 ENCOUNTER — Ambulatory Visit (INDEPENDENT_AMBULATORY_CARE_PROVIDER_SITE_OTHER): Payer: MEDICARE | Admitting: Internal Medicine

## 2013-12-20 VITALS — BP 124/68 | HR 58 | Ht 69.0 in | Wt 218.8 lb

## 2013-12-20 DIAGNOSIS — G4730 Sleep apnea, unspecified: Secondary | ICD-10-CM

## 2013-12-20 NOTE — Progress Notes (Signed)
12/20/13- 18 yoM never smoker referred courtesy of Dr Karlyn Agee study about 20 years ago. Hx mild OSA, tried CPAP but kept taking it off in his sleep at that time. Admits daytime sleepiness, loud snore, frequent waking.  Difficulty maintaining sleep with frequent waking during the night. Loud snoring. Nocturia x3 or 4. Bedtime between 6-9 PM, sleep latency 15 minutes, up at 6 AM. History of tonsillectomy, hypertension. ENT evaluation for vocal cord nodules/Dr. Constance Holster. Asthma.  Prior to Admission medications   Medication Sig Start Date End Date Taking? Authorizing Provider  amLODipine (NORVASC) 10 MG tablet Take 1 tablet (10 mg total) by mouth daily. 12/19/13  Yes Janith Lima, MD  aspirin 81 MG EC tablet Take 81 mg by mouth daily.     Yes Historical Provider, MD  celecoxib (CELEBREX) 200 MG capsule Take 1 capsule (200 mg total) by mouth daily. 04/02/13  Yes Janith Lima, MD  esomeprazole (NEXIUM) 40 MG capsule Take 1 capsule (40 mg total) by mouth daily. 06/03/13  Yes Janith Lima, MD  ezetimibe-simvastatin (VYTORIN) 10-20 MG per tablet Take 1 tablet by mouth at bedtime. 04/24/13  Yes Janith Lima, MD  hydrochlorothiazide (HYDRODIURIL) 25 MG tablet Take 1 tablet (25 mg total) by mouth daily. 12/19/13  Yes Janith Lima, MD  hydrOXYzine (ATARAX/VISTARIL) 10 MG tablet Take 10 mg by mouth 3 (three) times daily as needed for itching. 04/02/13  Yes Janith Lima, MD  levothyroxine (SYNTHROID, LEVOTHROID) 125 MCG tablet Take 125 mcg by mouth daily. 07/16/13  Yes Janith Lima, MD  losartan (COZAAR) 100 MG tablet Take 1 tablet (100 mg total) by mouth daily. 12/19/13  Yes Janith Lima, MD  mometasone (NASONEX) 50 MCG/ACT nasal spray Place 2 sprays into the nose daily. 03/10/11  Yes Janith Lima, MD  mometasone-formoterol (DULERA) 100-5 MCG/ACT AERO Inhale 2 puffs into the lungs 2 (two) times daily. 06/03/13  Yes Janith Lima, MD  Multiple Vitamins-Minerals (GNP MEGA MULTI FOR MEN) TABS Take 1  tablet by mouth daily.   Yes Historical Provider, MD  potassium chloride SA (K-DUR,KLOR-CON) 20 MEQ tablet TAKE 1 TABLET (20 MEQ TOTAL) BY MOUTH 2 (TWO) TIMES DAILY. 06/07/13  Yes Janith Lima, MD  Probiotic Product (ALIGN) 4 MG CAPS     Yes Historical Provider, MD   Past Medical History  Diagnosis Date  . GERD (gastroesophageal reflux disease)   . Hyperlipidemia   . HTN (hypertension)   . Hypothyroidism   . Osteoarthritis   . Wears glasses    Past Surgical History  Procedure Laterality Date  . Total knee arthroplasty  2009    left  . Thyroidectomy  1974  . Tonsillectomy    . Colonoscopy    . Eye surgery      both cataracts  . Microlaryngoscopy Left 11/18/2013    Procedure: MICROLARYNGOSCOPY WITH REMOVAL OF GRANULOMA LEFT SIDE;  Surgeon: Izora Gala, MD;  Location: [facility];  Service: ENT;  Laterality: Left;   Family History  Problem Relation Age of Onset  . Brain cancer Mother   . Heart disease Father   . Heart disease Brother    History   Social History  . Marital Status: Married    Spouse Name: N/A    Number of Children: N/A  . Years of Education: N/A   Occupational History  . Retired    Social History Main Topics  . Smoking status: Never Smoker   . Smokeless tobacco:  Never Used  . Alcohol Use: No  . Drug Use: No  . Sexual Activity: Yes   Other Topics Concern  . Not on file   Social History Narrative   Regular Exercise -  YES         ROS-see HPI Constitutional:   No-   weight loss, night sweats, fevers, chills, +fatigue, lassitude. HEENT:   No-  headaches, +difficulty swallowing, no-tooth/dental problems, sore throat,       No-  sneezing, itching, ear ache, nasal congestion, post nasal drip,  CV:  No-   chest pain, orthopnea, PND, swelling in lower extremities, anasarca,                                                    dizziness, +palpitations Resp: No-   shortness of breath with exertion or at rest.              No-   productive  cough,  No non-productive cough,  No- coughing up of blood.              No-   change in color of mucus.  No- wheezing.   Skin: No-   rash or lesions. GI:  +heartburn, No-indigestion, abdominal pain, nausea, vomiting, diarrhea,                 change in bowel habits, loss of appetite GU: No-   dysuria, change in color of urine, no urgency or frequency.  No- flank pain. MS:  No-   joint pain or swelling.  No- decreased range of motion.  No- back pain. Neuro-     nothing unusual Psych:  No- change in mood or affect. No depression or anxiety.  No memory loss.  OBJ- Physical Exam General- Alert, Oriented, Affect-appropriate, Distress- none acute Skin- rash-none, lesions- none, excoriation- none Lymphadenopathy- none Head- atraumatic            Eyes- Gross vision intact, PERRLA, conjunctivae and secretions clear            Ears- Hearing, canals-normal            Nose- Clear, no-Septal dev, mucus, polyps, erosion, perforation             Throat- Mallampati II-III , mucosa clear , drainage- none, tonsils- atrophic Neck- flexible , trachea midline, no stridor , thyroid nl, carotid no bruit Chest - symmetrical excursion , unlabored           Heart/CV- RRR , no murmur , no gallop  , no rub, nl s1 s2                           - JVD- none , edema- none, stasis changes- none, varices- none           Lung- clear to P&A, wheeze- none, cough- none , dullness-none, rub- none           Chest wall-  Abd- tender-no, distended-no, bowel sounds-present, HSM- no Br/ Gen/ Rectal- Not done, not indicated Extrem- cyanosis- none, clubbing, none, atrophy- none, strength- nl Neuro- grossly intact to observation

## 2013-12-20 NOTE — Patient Instructions (Signed)
Order- schedule split protocol NPSG    Dx OSA 

## 2013-12-30 ENCOUNTER — Ambulatory Visit (HOSPITAL_BASED_OUTPATIENT_CLINIC_OR_DEPARTMENT_OTHER): Payer: MEDICARE | Attending: Internal Medicine

## 2013-12-30 VITALS — Ht 69.0 in | Wt 218.0 lb

## 2013-12-30 DIAGNOSIS — G4730 Sleep apnea, unspecified: Secondary | ICD-10-CM

## 2013-12-30 DIAGNOSIS — G4733 Obstructive sleep apnea (adult) (pediatric): Secondary | ICD-10-CM | POA: Insufficient documentation

## 2014-01-05 DIAGNOSIS — G4733 Obstructive sleep apnea (adult) (pediatric): Secondary | ICD-10-CM

## 2014-01-05 DIAGNOSIS — G4730 Sleep apnea, unspecified: Secondary | ICD-10-CM

## 2014-01-05 NOTE — Sleep Study (Signed)
   NAME: Ronald Bond DATE OF BIRTH:  [birth year] MEDICAL RECORD NUMBER [id number]  LOCATION: [facility]  PHYSICIAN: YOUNG,CLINTON D  DATE OF STUDY: 12/30/2013  SLEEP STUDY TYPE: Nocturnal Polysomnogram               REFERRING PHYSICIAN: Baird Lyons D, MD  INDICATION FOR STUDY: Hypersomnia with sleep apnea  EPWORTH SLEEPINESS SCORE:   15/24 HEIGHT: 5\' 9"  (175.3 cm)  WEIGHT: 218 lb (98.884 kg)    Body mass index is 32.18 kg/(m^2).  NECK SIZE: 16.5 in.  MEDICATIONS: Charted for review  SLEEP ARCHITECTURE: Total sleep time 277.5 minutes with sleep efficiency 73.9%. Stage I was 8.6%, stage II 78.9%, stage III absent, REM 12.4% of total sleep time. Sleep latency 25.5 minutes, REM latency 137 minutes, awake after sleep onset 41.5 minutes, arousal index 3.2. Bedtime medication: None  RESPIRATORY DATA: Apnea hypopneasindex (AHI) 10.8 per hour. 50 events were scored including 29 obstructive apneas and 21 hypopneas. Events were more common while supine. REM AHI 10.8 per hour. There were not enough early events to permit application of split protocol CPAP titration.  OXYGEN DATA: Loud snoring with oxygen desaturation to a nadir of 81% and mean oxygen saturation through the study at 94.4% on room air.  CARDIAC DATA: Sinus rhythm with PVCs  MOVEMENT/PARASOMNIA: No significant movement disturbance. Bathroom x2  IMPRESSION/ RECOMMENDATION:   1) Mild obstructive sleep apnea/ hypopnea syndrome, AHI 10.8 per hour with mainly supine defects. REM AHI 50.4 per hour. Loud snoring with oxygen desaturation to a nadir of 81% and mean oxygen saturation through the study of 94.4% on room air. 2) There were not enough early events to permit split protocol CPAP titration.  Signed Baird Lyons M.D. Deneise Lever Diplomate, American Board of Sleep Medicine  ELECTRONICALLY SIGNED ON:  01/05/2014, 2:15 PM [place] PH: [phone]   FX: [phone]  [phone] [place]

## 2014-01-18 NOTE — Assessment & Plan Note (Signed)
Needs to confirm a diagnosis and reestablish CPAP Plan-schedule sleep study

## 2014-01-20 ENCOUNTER — Ambulatory Visit (INDEPENDENT_AMBULATORY_CARE_PROVIDER_SITE_OTHER): Payer: MEDICARE | Admitting: Internal Medicine

## 2014-01-20 ENCOUNTER — Encounter: Payer: Self-pay | Admitting: Internal Medicine

## 2014-01-20 VITALS — BP 138/82 | HR 65 | Ht 69.0 in | Wt 220.0 lb

## 2014-01-20 DIAGNOSIS — G4733 Obstructive sleep apnea (adult) (pediatric): Secondary | ICD-10-CM

## 2014-01-20 NOTE — Patient Instructions (Addendum)
Order- new DME new CPAP autotitrate 5-20 cwp, mask of choice, supplies, humidifier Dx OSA  Please call as needed

## 2014-01-20 NOTE — Progress Notes (Signed)
12/20/13- 33 yoM never smoker referred courtesy of Dr Karlyn Agee study about 20 years ago. Hx mild OSA, tried CPAP but kept taking it off in his sleep at that time. Admits daytime sleepiness, loud snore, frequent waking.  Difficulty maintaining sleep with frequent waking during the night. Loud snoring. Nocturia x3 or 4. Bedtime between 6-9 PM, sleep latency 15 minutes, up at 6 AM. History of tonsillectomy, hypertension. ENT evaluation for vocal cord nodules/Dr. Constance Holster. Asthma.  01/20/14- 26yo M never smoker followed for OSA, complicated by allergic rhinitis, asthma FOLLOWS FOR: review sleep study in detail with patient; denies any new issues with sleep since last OV  NPSG 12/30/13- Mild OSA AHI 10.8/ hr, weight 218 lbs. Education done.  ROS-see HPI Constitutional:   No-   weight loss, night sweats, fevers, chills, +fatigue, lassitude. HEENT:   No-  headaches, +difficulty swallowing, no-tooth/dental problems, sore throat,       No-  sneezing, itching, ear ache, nasal congestion, post nasal drip,  CV:  No-   chest pain, orthopnea, PND, swelling in lower extremities, anasarca,                                                    dizziness, +palpitations Resp: No-   shortness of breath with exertion or at rest.              No-   productive cough,  No non-productive cough,  No- coughing up of blood.              No-   change in color of mucus.  No- wheezing.   Skin: No-   rash or lesions. GI:  +heartburn, No-indigestion, abdominal pain, nausea, vomiting, of appetite GU:  MS:  No-   joint pain or swelling.   Neuro-     nothing unusual Psych:  No- change in mood or affect. No depression or anxiety.  No memory loss.  OBJ- Physical Exam General- Alert, Oriented, Affect-appropriate, Distress- none acute Skin- rash-none, lesions- none, excoriation- none Lymphadenopathy- none Head- atraumatic            Eyes- Gross vision intact, PERRLA, conjunctivae and secretions clear            Ears-  Hearing, canals-normal            Nose- Clear, no-Septal dev, mucus, polyps, erosion, perforation             Throat- Mallampati II-III , mucosa clear , drainage- none, tonsils- atrophic Neck- flexible , trachea midline, no stridor , thyroid nl, carotid no bruit Chest - symmetrical excursion , unlabored           Heart/CV- RRR , no murmur , no gallop  , no rub, nl s1 s2                           - JVD- none , edema- none, stasis changes- none, varices- none           Lung- clear to P&A, wheeze- none, cough- none , dullness-none, rub- none           Chest wall-  Abd-  Br/ Gen/ Rectal- Not done, not indicated Extrem- cyanosis- none, clubbing, none, atrophy- none, strength- nl Neuro- grossly intact to observation

## 2014-02-12 ENCOUNTER — Other Ambulatory Visit: Payer: Self-pay

## 2014-02-12 ENCOUNTER — Other Ambulatory Visit: Payer: Self-pay | Admitting: Internal Medicine

## 2014-02-12 DIAGNOSIS — E785 Hyperlipidemia, unspecified: Secondary | ICD-10-CM

## 2014-02-12 MED ORDER — ATORVASTATIN CALCIUM 40 MG PO TABS: 40.0000 mg | ORAL_TABLET | Freq: Every day | ORAL | Status: DC

## 2014-02-12 NOTE — Telephone Encounter (Signed)
Received pharmacy rejection stating that insurance will not cover vytorin  without a prior authorization. Per MD, medication switched to Lipitor, pt notified.

## 2014-02-14 NOTE — Assessment & Plan Note (Signed)
Mild OSA. Educated on basics, treatment options, role of weight, responsibility to drive safely. Plan- Start CPAP auto

## 2014-03-06 ENCOUNTER — Ambulatory Visit: Payer: MEDICARE | Admitting: Internal Medicine

## 2014-03-24 ENCOUNTER — Ambulatory Visit (INDEPENDENT_AMBULATORY_CARE_PROVIDER_SITE_OTHER): Payer: MEDICARE | Admitting: Internal Medicine

## 2014-03-24 ENCOUNTER — Encounter: Payer: Self-pay | Admitting: Internal Medicine

## 2014-03-24 VITALS — BP 130/82 | HR 67 | Ht 69.0 in | Wt 216.6 lb

## 2014-03-24 DIAGNOSIS — G4733 Obstructive sleep apnea (adult) (pediatric): Secondary | ICD-10-CM | POA: Diagnosis not present

## 2014-03-24 DIAGNOSIS — J309 Allergic rhinitis, unspecified: Secondary | ICD-10-CM | POA: Diagnosis not present

## 2014-03-24 NOTE — Progress Notes (Signed)
12/20/13- 2 yoM never smoker referred courtesy of Dr Karlyn Agee study about 20 years ago. Hx mild OSA, tried CPAP but kept taking it off in his sleep at that time. Admits daytime sleepiness, loud snore, frequent waking.  Difficulty maintaining sleep with frequent waking during the night. Loud snoring. Nocturia x3 or 4. Bedtime between 6-9 PM, sleep latency 15 minutes, up at 6 AM. History of tonsillectomy, hypertension. ENT evaluation for vocal cord nodules/Dr. Constance Holster. Asthma.  01/20/14- 46yo M never smoker followed for OSA, complicated by allergic rhinitis, asthma FOLLOWS FOR: review sleep study in detail with patient; denies any new issues with sleep since last OV  NPSG 12/30/13- Mild OSA AHI 10.8/ hr, weight 218 lbs. Education done.  03/24/14- 57yo M never smoker followed for OSA, complicated by allergic rhinitis, asthma FOLLOWS FOR:  Wearing CPAP Auto [facility]  6 hours per night.  Discuss pressure Some days pressure seems too high. We discussed changing from auto titration to a fixed pressure. Minimal seasonal rhinitis congestion.  ROS-see HPI Constitutional:   No-   weight loss, night sweats, fevers, chills, +fatigue, lassitude. HEENT:   No-  headaches, +difficulty swallowing, no-tooth/dental problems, sore throat,       No-  sneezing, itching, ear ache, +nasal congestion, post nasal drip,  CV:  No-   chest pain, orthopnea, PND, swelling in lower extremities, anasarca,                                                    dizziness, +palpitations Resp: No-   shortness of breath with exertion or at rest.              No-   productive cough,  No non-productive cough,  No- coughing up of blood.              No-   change in color of mucus.  No- wheezing.   Skin: No-   rash or lesions. GI:  +heartburn, No-indigestion, abdominal pain, nausea, vomiting, of appetite GU:  MS:  No-   joint pain or swelling.   Neuro-     nothing unusual Psych:  No- change in mood or affect. No depression or  anxiety.  No memory loss.  OBJ- Physical Exam General- Alert, Oriented, Affect-appropriate, Distress- none acute Skin- rash-none, lesions- none, excoriation- none Lymphadenopathy- none Head- atraumatic            Eyes- Gross vision intact, PERRLA, conjunctivae and secretions clear            Ears- Hearing, canals-normal            Nose- Clear, no-Septal dev, mucus, polyps, erosion, perforation             Throat- Mallampati III-IV , mucosa clear , drainage- none, tonsils- atrophic Neck- flexible , trachea midline, no stridor , thyroid nl, carotid no bruit Chest - symmetrical excursion , unlabored           Heart/CV- RRR , no murmur , no gallop  , no rub, nl s1 s2                           - JVD- none , edema- none, stasis changes- none, varices- none           Lung- clear to P&A, wheeze-  none, cough- none , dullness-none, rub- none           Chest wall-  Abd-  Br/ Gen/ Rectal- Not done, not indicated Extrem- cyanosis- none, clubbing, none, atrophy- none, strength- nl Neuro- grossly intact to observation

## 2014-03-24 NOTE — Patient Instructions (Signed)
Order- DME [employer] - CPAP download for pressure/ compliance  When I have the download report, I will be able to change the setting to a fixed steady pressure. If it is not comfortable for you, please let us know.

## 2014-04-04 ENCOUNTER — Telehealth: Payer: Self-pay | Admitting: Internal Medicine

## 2014-04-04 ENCOUNTER — Other Ambulatory Visit: Payer: Self-pay | Admitting: Internal Medicine

## 2014-04-04 DIAGNOSIS — M545 Low back pain, unspecified: Secondary | ICD-10-CM

## 2014-04-04 DIAGNOSIS — M5126 Other intervertebral disc displacement, lumbar region: Secondary | ICD-10-CM

## 2014-04-04 DIAGNOSIS — M1990 Unspecified osteoarthritis, unspecified site: Secondary | ICD-10-CM

## 2014-04-04 MED ORDER — MELOXICAM 7.5 MG PO TABS: 7.5000 mg | ORAL_TABLET | Freq: Every day | ORAL | Status: DC

## 2014-04-04 NOTE — Telephone Encounter (Signed)
Pt rq to change from Celocoxib to Meloxicam due to copay.   Please advise

## 2014-04-04 NOTE — Telephone Encounter (Signed)
done

## 2014-04-20 NOTE — Assessment & Plan Note (Signed)
Plan-download for pressure recommendation and compliance

## 2014-04-20 NOTE — Assessment & Plan Note (Signed)
Seasonal pollen rhinitis. Discussed antihistamines.

## 2014-05-13 ENCOUNTER — Other Ambulatory Visit: Payer: Self-pay | Admitting: Internal Medicine

## 2014-06-02 ENCOUNTER — Ambulatory Visit: Payer: MEDICARE | Admitting: Internal Medicine

## 2014-06-05 ENCOUNTER — Ambulatory Visit (INDEPENDENT_AMBULATORY_CARE_PROVIDER_SITE_OTHER): Payer: MEDICARE | Admitting: Internal Medicine

## 2014-06-05 ENCOUNTER — Encounter: Payer: Self-pay | Admitting: Internal Medicine

## 2014-06-05 ENCOUNTER — Ambulatory Visit (INDEPENDENT_AMBULATORY_CARE_PROVIDER_SITE_OTHER)
Admission: RE | Admit: 2014-06-05 | Discharge: 2014-06-05 | Disposition: A | Discharge status: Home / Self Care | Payer: MEDICARE | Source: Ambulatory Visit | Attending: Internal Medicine | Admitting: Internal Medicine

## 2014-06-05 ENCOUNTER — Telehealth: Payer: Self-pay | Admitting: Internal Medicine

## 2014-06-05 VITALS — BP 120/70 | HR 60 | Temp 98.3°F | Resp 16 | Ht 70.0 in | Wt 212.0 lb

## 2014-06-05 DIAGNOSIS — IMO0002 Reserved for concepts with insufficient information to code with codable children: Secondary | ICD-10-CM | POA: Diagnosis not present

## 2014-06-05 DIAGNOSIS — M1711 Unilateral primary osteoarthritis, right knee: Secondary | ICD-10-CM

## 2014-06-05 DIAGNOSIS — M1710 Unilateral primary osteoarthritis, unspecified knee: Secondary | ICD-10-CM | POA: Diagnosis not present

## 2014-06-05 DIAGNOSIS — I10 Essential (primary) hypertension: Secondary | ICD-10-CM | POA: Diagnosis not present

## 2014-06-05 MED ORDER — METHYLPREDNISOLONE ACETATE 40 MG/ML IJ SUSP
40.0000 mg | Freq: Once | INTRAMUSCULAR | Status: AC
  Administered 2014-06-05: 40 mg via INTRA_ARTICULAR

## 2014-06-05 MED ORDER — FLAVOCOXID-CIT ZN BISGLCINATE 500-50 MG PO CAPS
1.0000 | ORAL_CAPSULE | Freq: Two times a day (BID) | ORAL | Status: DC
Start: 2014-06-05 — End: 2015-08-25

## 2014-06-05 NOTE — Progress Notes (Signed)
Pre visit review using our clinic review tool, if applicable. No additional management support is needed unless otherwise documented below in the visit note. 

## 2014-06-05 NOTE — Telephone Encounter (Signed)
Called pharmacy to inform them about card. Pharmacist stated that pt has Medicare and they will not let him use the discount card.

## 2014-06-05 NOTE — Patient Instructions (Signed)
Osteoarthritis Osteoarthritis is a disease that causes soreness and swelling (inflammation) of a joint. It occurs when the cartilage at the affected joint wears down. Cartilage acts as a cushion, covering the ends of bones where they meet to form a joint. Osteoarthritis is the most common form of arthritis. It often occurs in older people. The joints affected most often by this condition include those in the:  Ends of the fingers.  Thumbs.  Neck.  Lower back.  Knees.  Hips. CAUSES  Over time, the cartilage that covers the ends of bones begins to wear away. This causes bone to rub on bone, producing pain and stiffness in the affected joints.  RISK FACTORS Certain factors can increase your chances of having osteoarthritis, including:  Older age.  Excessive body weight.  Overuse of joints. SIGNS AND SYMPTOMS   Pain, swelling, and stiffness in the joint.  Over time, the joint may lose its normal shape.  Small deposits of bone (osteophytes) may grow on the edges of the joint.  Bits of bone or cartilage can break off and float inside the joint space. This may cause more pain and damage. DIAGNOSIS  Your health care provider will do a physical exam and ask about your symptoms. Various tests may be ordered, such as:  X-rays of the affected joint.  An MRI scan.  Blood tests to rule out other types of arthritis.  Joint fluid tests. This involves using a needle to draw fluid from the joint and examining the fluid under a microscope. TREATMENT  Goals of treatment are to control pain and improve joint function. Treatment plans may include:  A prescribed exercise program that allows for rest and joint relief.  A weight control plan.  Pain relief techniques, such as:  Properly applied heat and cold.  Electric pulses delivered to nerve endings under the skin (transcutaneous electrical nerve stimulation, TENS).  Massage.  Certain nutritional supplements.  Medicines to  control pain, such as:  Acetaminophen.  Nonsteroidal anti-inflammatory drugs (NSAIDs), such as naproxen.  Narcotic or central-acting agents, such as tramadol.  Corticosteroids. These can be given orally or as an injection.  Surgery to reposition the bones and relieve pain (osteotomy) or to remove loose pieces of bone and cartilage. Joint replacement may be needed in advanced states of osteoarthritis. HOME CARE INSTRUCTIONS   Only take over-the-counter or prescription medicines as directed by your health care provider. Take all medicines exactly as instructed.  Maintain a healthy weight. Follow your health care provider's instructions for weight control. This may include dietary instructions.  Exercise as directed. Your health care provider can recommend specific types of exercise. These may include:  Strengthening exercises--These are done to strengthen the muscles that support joints affected by arthritis. They can be performed with weights or with exercise bands to add resistance.  Aerobic activities--These are exercises, such as brisk walking or low-impact aerobics, that get your heart pumping.  Range-of-motion activities--These keep your joints limber.  Balance and agility exercises--These help you maintain daily living skills.  Rest your affected joints as directed by your health care provider.  Follow up with your health care provider as directed. SEEK MEDICAL CARE IF:   Your skin turns red.  You develop a rash in addition to your joint pain.  You have worsening joint pain. SEEK IMMEDIATE MEDICAL CARE IF:  You have a significant loss of weight or appetite.  You have a fever along with joint or muscle aches.  You have night sweats. FOR MORE   INFORMATION  National Institute of Arthritis and Musculoskeletal and Skin Diseases: www.niams.nih.gov National Institute on Aging: www.nia.nih.gov American College of Rheumatology: www.rheumatology.org Document Released:  11/14/2005 Document Revised: 09/04/2013 Document Reviewed: 07/22/2013 Exit[employer] Patient Information 2015 [employer]. This information is not intended to replace advice given to you by your health care provider. Make sure you discuss any questions you have with your health care provider.  

## 2014-06-05 NOTE — Telephone Encounter (Signed)
He has a card with his samples that cuts the price down to $50 per month

## 2014-06-05 NOTE — Telephone Encounter (Signed)
[facility] called and states that the patient's Flavocoxid-Cit Zn Bisglcinate ([id number]) 500-50 MG medication sent is not covered by his insurance. Patient would have to pay $120. They want to know if Dr. Ronnald Ramp wants to prescribe something else. Please advise.

## 2014-06-05 NOTE — Progress Notes (Signed)
Subjective:    Patient ID: Ronald Bond, male    DOB: 1946/07/25, 68 y.o.   MRN: [id number]  Knee Pain  The incident occurred more than 1 week ago. There was no injury mechanism. The pain is present in the right knee. The quality of the pain is described as aching. The pain is at a severity of 2/10. The pain is mild. The pain has been worsening since onset. Pertinent negatives include no inability to bear weight, loss of motion, loss of sensation, muscle weakness, numbness or tingling. He reports no foreign bodies present. The symptoms are aggravated by movement. He has tried acetaminophen and NSAIDs for the symptoms. The treatment provided mild relief.      Review of Systems  Constitutional: Negative.  Negative for fever, chills, diaphoresis, appetite change and fatigue.  HENT: Negative.   Eyes: Negative.   Respiratory: Negative.  Negative for cough, choking, chest tightness, shortness of breath and stridor.   Cardiovascular: Negative.  Negative for chest pain, palpitations and leg swelling.  Gastrointestinal: Negative.  Negative for nausea, vomiting, abdominal pain, diarrhea, constipation and blood in stool.  Endocrine: Negative.   Genitourinary: Negative.   Musculoskeletal: Positive for arthralgias. Negative for back pain, gait problem, joint swelling, myalgias, neck pain and neck stiffness.  Skin: Negative.   Allergic/Immunologic: Negative.   Neurological: Negative.  Negative for tingling and numbness.  Hematological: Negative.  Negative for adenopathy. Does not bruise/bleed easily.  Psychiatric/Behavioral: Negative.        Objective:   Physical Exam  Vitals reviewed. Constitutional: He is oriented to person, place, and time. He appears well-developed and well-nourished. No distress.  HENT:  Head: Normocephalic and atraumatic.  Mouth/Throat: Oropharynx is clear and moist. No oropharyngeal exudate.  Eyes: Conjunctivae are normal. Right eye exhibits no discharge. Left eye  exhibits no discharge. No scleral icterus.  Neck: Normal range of motion. Neck supple. No JVD present. No tracheal deviation present. No thyromegaly present.  Cardiovascular: Normal rate, regular rhythm, normal heart sounds and intact distal pulses.  Exam reveals no gallop and no friction rub.   No murmur heard. Pulmonary/Chest: Effort normal and breath sounds normal. No stridor. No respiratory distress. He has no wheezes. He has no rales. He exhibits no tenderness.  Abdominal: Soft. Bowel sounds are normal. He exhibits no distension and no mass. There is no tenderness. There is no rebound and no guarding.  Musculoskeletal: Normal range of motion. He exhibits no edema and no tenderness.       Right knee: He exhibits deformity (DJD changes). He exhibits normal range of motion, no swelling, no effusion, no ecchymosis, no laceration, no erythema, normal alignment, no LCL laxity, normal patellar mobility and no bony tenderness. No tenderness found. No medial joint line, no lateral joint line, no MCL, no LCL and no patellar tendon tenderness noted.  Rt knee was cleaned with betadine then prepped and draped in sterile fashion, local anesthesia was obtained with 2% lido with epi - 1 cc was injected, then from the medial approach the joint space was easily entered and 1/2 cc depo-medrol 40 mg and 1/2 cc of 2% plain lidocainewas injected into the IA space, he tolerated this well and there were no complications.  Lymphadenopathy:    He has no cervical adenopathy.  Neurological: He is oriented to person, place, and time.  Skin: Skin is warm and dry. No rash noted. He is not diaphoretic. No erythema. No pallor.    Lab Results  Component Value Date  WBC 10.1 10/21/2013   HGB 14.6 11/18/2013   HCT 41.1 10/21/2013   PLT 251.0 10/21/2013   GLUCOSE 102* 12/19/2013   CHOL 156 07/16/2013   TRIG 80.0 07/16/2013   HDL 54.70 07/16/2013   LDLDIRECT 180.8 04/02/2013   LDLCALC 85 07/16/2013   ALT 24 10/21/2013   AST 23  10/21/2013   NA 138 12/19/2013   K 3.6 12/19/2013   CL 103 12/19/2013   CREATININE 1.4 12/19/2013   BUN 13 12/19/2013   CO2 29 12/19/2013   TSH 3.61 12/19/2013   PSA 1.66 04/02/2013   INR 0.9 10/17/2007   HGBA1C 6.1 04/02/2013        Assessment & Plan:

## 2014-06-06 ENCOUNTER — Encounter: Payer: Self-pay | Admitting: Internal Medicine

## 2014-06-07 NOTE — Assessment & Plan Note (Signed)
IA injection done I have asked him to try limbrel

## 2014-06-07 NOTE — Assessment & Plan Note (Signed)
His BP is well controlled 

## 2014-06-11 ENCOUNTER — Other Ambulatory Visit: Payer: Self-pay | Admitting: Internal Medicine

## 2014-06-12 ENCOUNTER — Telehealth: Payer: Self-pay | Admitting: *Deleted

## 2014-06-12 MED ORDER — TRIAMCINOLONE ACETONIDE 0.1 % EX CREA
1.0000 | TOPICAL_CREAM | Freq: Two times a day (BID) | CUTANEOUS | Status: DC
Start: 2014-06-12 — End: 2014-08-07

## 2014-06-12 NOTE — Telephone Encounter (Signed)
Notified pt md [facility] cream to [facility]...Ronald Bond

## 2014-06-12 NOTE — Telephone Encounter (Signed)
Pt stated when he saw Dr. Ronnald Ramp on 06/05/14 he was going to send over a steroid cream for the rash that's on his leg. Pt states md used the term " Eczema" pharmacy never received cream. Inform pt md is on vacation will send msg to another provider to see if they can send in cream.../lmb

## 2014-06-12 NOTE — Telephone Encounter (Signed)
Triamcinolone -erx done

## 2014-07-24 ENCOUNTER — Ambulatory Visit: Payer: MEDICARE | Admitting: Internal Medicine

## 2014-08-07 ENCOUNTER — Ambulatory Visit (INDEPENDENT_AMBULATORY_CARE_PROVIDER_SITE_OTHER): Payer: MEDICARE | Admitting: Internal Medicine

## 2014-08-07 ENCOUNTER — Other Ambulatory Visit (INDEPENDENT_AMBULATORY_CARE_PROVIDER_SITE_OTHER): Payer: MEDICARE

## 2014-08-07 ENCOUNTER — Ambulatory Visit (INDEPENDENT_AMBULATORY_CARE_PROVIDER_SITE_OTHER)
Admission: RE | Admit: 2014-08-07 | Discharge: 2014-08-07 | Disposition: A | Discharge status: Home / Self Care | Payer: MEDICARE | Source: Ambulatory Visit | Attending: Internal Medicine | Admitting: Internal Medicine

## 2014-08-07 ENCOUNTER — Encounter: Payer: Self-pay | Admitting: Internal Medicine

## 2014-08-07 VITALS — BP 122/78 | HR 50 | Temp 98.1°F | Resp 16 | Ht 70.0 in | Wt 206.8 lb

## 2014-08-07 DIAGNOSIS — I10 Essential (primary) hypertension: Secondary | ICD-10-CM

## 2014-08-07 DIAGNOSIS — Z23 Encounter for immunization: Secondary | ICD-10-CM | POA: Diagnosis not present

## 2014-08-07 DIAGNOSIS — L089 Local infection of the skin and subcutaneous tissue, unspecified: Secondary | ICD-10-CM | POA: Diagnosis not present

## 2014-08-07 DIAGNOSIS — M7700 Medial epicondylitis, unspecified elbow: Secondary | ICD-10-CM

## 2014-08-07 DIAGNOSIS — E785 Hyperlipidemia, unspecified: Secondary | ICD-10-CM | POA: Diagnosis not present

## 2014-08-07 DIAGNOSIS — M25521 Pain in right elbow: Secondary | ICD-10-CM

## 2014-08-07 DIAGNOSIS — R21 Rash and other nonspecific skin eruption: Secondary | ICD-10-CM | POA: Insufficient documentation

## 2014-08-07 DIAGNOSIS — M25529 Pain in unspecified elbow: Secondary | ICD-10-CM

## 2014-08-07 DIAGNOSIS — L259 Unspecified contact dermatitis, unspecified cause: Secondary | ICD-10-CM

## 2014-08-07 DIAGNOSIS — M19039 Primary osteoarthritis, unspecified wrist: Secondary | ICD-10-CM | POA: Diagnosis not present

## 2014-08-07 DIAGNOSIS — E039 Hypothyroidism, unspecified: Secondary | ICD-10-CM | POA: Diagnosis not present

## 2014-08-07 DIAGNOSIS — M7701 Medial epicondylitis, right elbow: Secondary | ICD-10-CM

## 2014-08-07 DIAGNOSIS — L239 Allergic contact dermatitis, unspecified cause: Secondary | ICD-10-CM | POA: Insufficient documentation

## 2014-08-07 LAB — LIPID PANEL
CHOL/HDL RATIO: 4
Cholesterol: 270 mg/dL — ABNORMAL HIGH (ref 0–200)
HDL: 62.1 mg/dL (ref >39.00)
LDL Cholesterol: 182 mg/dL — ABNORMAL HIGH (ref 0–99)
NONHDL: 207.9
TRIGLYCERIDES: 130 mg/dL (ref 0.0–149.0)
VLDL: 26 mg/dL (ref 0.0–40.0)

## 2014-08-07 LAB — BASIC METABOLIC PANEL
BUN: 14 mg/dL (ref 6–23)
CO2: 31 mEq/L (ref 19–32)
Calcium: 9.3 mg/dL (ref 8.4–10.5)
Chloride: 102 mEq/L (ref 96–112)
Creatinine, Ser: 1.3 mg/dL (ref 0.4–1.5)
GFR: 68.63 mL/min (ref >60.00)
Glucose, Bld: 85 mg/dL (ref 70–99)
POTASSIUM: 3.8 meq/L (ref 3.5–5.1)
Sodium: 139 mEq/L (ref 135–145)

## 2014-08-07 LAB — TSH: TSH: 31.33 u[IU]/mL — AB (ref 0.35–4.50)

## 2014-08-07 MED ORDER — CLOBETASOL PROPIONATE 0.05 % EX OINT: 1.0000 "application " | TOPICAL_OINTMENT | Freq: Two times a day (BID) | CUTANEOUS | Status: DC

## 2014-08-07 NOTE — Patient Instructions (Signed)
Eczema Eczema, also called atopic dermatitis, is a skin disorder that causes inflammation of the skin. It causes a red rash and dry, scaly skin. The skin becomes very itchy. Eczema is generally worse during the cooler winter months and often improves with the warmth of summer. Eczema usually starts showing signs in infancy. Some children outgrow eczema, but it may last through adulthood.  CAUSES  The exact cause of eczema is not known, but it appears to run in families. People with eczema often have a family history of eczema, allergies, asthma, or hay fever. Eczema is not contagious. Flare-ups of the condition may be caused by:   Contact with something you are sensitive or allergic to.   Stress. SIGNS AND SYMPTOMS  Dry, scaly skin.   Red, itchy rash.   Itchiness. This may occur before the skin rash and may be very intense.  DIAGNOSIS  The diagnosis of eczema is usually made based on symptoms and medical history. TREATMENT  Eczema cannot be cured, but symptoms usually can be controlled with treatment and other strategies. A treatment plan might include:  Controlling the itching and scratching.   Use over-the-counter antihistamines as directed for itching. This is especially useful at night when the itching tends to be worse.   Use over-the-counter steroid creams as directed for itching.   Avoid scratching. Scratching makes the rash and itching worse. It may also result in a skin infection (impetigo) due to a break in the skin caused by scratching.   Keeping the skin well moisturized with creams every day. This will seal in moisture and help prevent dryness. Lotions that contain alcohol and water should be avoided because they can dry the skin.   Limiting exposure to things that you are sensitive or allergic to (allergens).   Recognizing situations that cause stress.   Developing a plan to manage stress.  HOME CARE INSTRUCTIONS   Only take over-the-counter or  prescription medicines as directed by your health care provider.   Do not use anything on the skin without checking with your health care provider.   Keep baths or showers short (5 minutes) in warm (not hot) water. Use mild cleansers for bathing. These should be unscented. You may add nonperfumed bath oil to the bath water. It is best to avoid soap and bubble bath.   Immediately after a bath or shower, when the skin is still damp, apply a moisturizing ointment to the entire body. This ointment should be a petroleum ointment. This will seal in moisture and help prevent dryness. The thicker the ointment, the better. These should be unscented.   Keep fingernails cut short. Children with eczema may need to wear soft gloves or mittens at night after applying an ointment.   Dress in clothes made of cotton or cotton blends. Dress lightly, because heat increases itching.   A child with eczema should stay away from anyone with fever blisters or cold sores. The virus that causes fever blisters (herpes simplex) can cause a serious skin infection in children with eczema. SEEK MEDICAL CARE IF:   Your itching interferes with sleep.   Your rash gets worse or is not better within 1 week after starting treatment.   You see pus or soft yellow scabs in the rash area.   You have a fever.   You have a rash flare-up after contact with someone who has fever blisters.  Document Released: 11/11/2000 Document Revised: 09/04/2013 Document Reviewed: 06/17/2013 Exit[employer] Patient Information 2015 [employer]. This information   is not intended to replace advice given to you by your health care provider. Make sure you discuss any questions you have with your health care provider.  

## 2014-08-07 NOTE — Progress Notes (Signed)
Subjective:    Patient ID: Ronald Bond, male    DOB: 07/09/1946, 68 y.o.   MRN: [id number]  Rash This is a recurrent problem. The current episode started more than 1 year ago. The problem is unchanged. The rash is diffuse. The rash is characterized by scaling, redness, itchiness and dryness. He was exposed to nothing. Pertinent negatives include no anorexia, congestion, cough, diarrhea, eye pain, facial edema, fatigue, fever, joint pain, nail changes, rhinorrhea, shortness of breath, sore throat or vomiting. Past treatments include topical steroids. The treatment provided moderate relief. His past medical history is significant for allergies and eczema. There is no history of asthma or varicella.      Review of Systems  Constitutional: Negative.  Negative for fever, chills, diaphoresis, appetite change and fatigue.  HENT: Negative for congestion, rhinorrhea and sore throat.   Eyes: Negative.  Negative for pain.  Respiratory: Negative.  Negative for cough and shortness of breath.   Cardiovascular: Negative.  Negative for chest pain, palpitations and leg swelling.  Gastrointestinal: Negative.  Negative for nausea, vomiting, abdominal pain, diarrhea, constipation, blood in stool and anorexia.  Endocrine: Negative.   Genitourinary: Negative.   Musculoskeletal: Positive for arthralgias (right elbow pain for several months). Negative for back pain, gait problem, joint pain, joint swelling, myalgias, neck pain and neck stiffness.  Skin: Positive for rash. Negative for nail changes, color change, pallor and wound.  Allergic/Immunologic: Negative.   Neurological: Negative.   Hematological: Negative.  Negative for adenopathy. Does not bruise/bleed easily.  Psychiatric/Behavioral: Negative.        Objective:   Physical Exam  Vitals reviewed. Constitutional: He is oriented to person, place, and time. He appears well-developed and well-nourished. No distress.  HENT:  Head: Normocephalic and  atraumatic.  Mouth/Throat: Oropharynx is clear and moist. No oropharyngeal exudate.  Eyes: Conjunctivae are normal. Right eye exhibits no discharge. Left eye exhibits no discharge. No scleral icterus.  Neck: Normal range of motion. Neck supple. No JVD present. No tracheal deviation present. No thyromegaly present.  Cardiovascular: Normal rate, regular rhythm, normal heart sounds and intact distal pulses.  Exam reveals no gallop and no friction rub.   No murmur heard. Pulmonary/Chest: Effort normal and breath sounds normal. No stridor. No respiratory distress. He has no wheezes. He has no rales. He exhibits no tenderness.  Abdominal: Soft. Bowel sounds are normal. He exhibits no distension and no mass. There is no tenderness. There is no rebound and no guarding.  Musculoskeletal: He exhibits no edema and no tenderness.       Right elbow: He exhibits decreased range of motion. He exhibits no swelling, no effusion, no deformity and no laceration. No radial head, no lateral epicondyle and no olecranon process tenderness noted.  Lymphadenopathy:    He has no cervical adenopathy.  Neurological: He is oriented to person, place, and time.  Skin: Skin is warm and dry. Rash noted. No purpura noted. Rash is macular. Rash is not papular, not maculopapular, not nodular, not pustular, not vesicular and not urticarial. He is not diaphoretic. No erythema. No pallor.  There are scattered hyperpigmented, scaly, macules over the LE's diffusely, there is some central clearing the edges are somewhat raised.  A lesion over the LLE anteriorly was biopsied - the area was cleaned with betadine then prepped and draped in sterile fashion. Local anesthesia was obtained with 2% lido with epi, 1 cc was used, next a 4 mm punch incision was made and a specimen was collected  and sent. The wound was closed with 1 suture - 4.0 prolene. There was good closure, he tolerated it well, +hemostasis, neosporin and a dressing were applied.      Lab Results  Component Value Date   WBC 10.1 10/21/2013   HGB 14.6 11/18/2013   HCT 41.1 10/21/2013   PLT 251.0 10/21/2013   GLUCOSE 102* 12/19/2013   CHOL 156 07/16/2013   TRIG 80.0 07/16/2013   HDL 54.70 07/16/2013   LDLDIRECT 180.8 04/02/2013   LDLCALC 85 07/16/2013   ALT 24 10/21/2013   AST 23 10/21/2013   NA 138 12/19/2013   K 3.6 12/19/2013   CL 103 12/19/2013   CREATININE 1.4 12/19/2013   BUN 13 12/19/2013   CO2 29 12/19/2013   TSH 3.61 12/19/2013   PSA 1.66 04/02/2013   INR 0.9 10/17/2007   HGBA1C 6.1 04/02/2013       Assessment & Plan:

## 2014-08-07 NOTE — Progress Notes (Signed)
Pre visit review using our clinic review tool, if applicable. No additional management support is needed unless otherwise documented below in the visit note. 

## 2014-08-08 ENCOUNTER — Telehealth: Payer: Self-pay

## 2014-08-08 DIAGNOSIS — L239 Allergic contact dermatitis, unspecified cause: Secondary | ICD-10-CM

## 2014-08-08 MED ORDER — MOMETASONE FUROATE 0.1 % EX OINT: TOPICAL_OINTMENT | Freq: Every day | CUTANEOUS | Status: DC

## 2014-08-08 NOTE — Telephone Encounter (Signed)
changed

## 2014-08-08 NOTE — Telephone Encounter (Signed)
Pt requesting  Change in clobetasol 0.05% ointment rx, his co-pay is $45, and would like something more affordable.  Suggestions are elecon and mometasone.  Please advise

## 2014-08-10 ENCOUNTER — Encounter: Payer: Self-pay | Admitting: Internal Medicine

## 2014-08-10 NOTE — Assessment & Plan Note (Signed)
Await biopsy results.

## 2014-08-10 NOTE — Assessment & Plan Note (Addendum)
+   DJD with spurring on xray, cont mobic, try PT

## 2014-08-10 NOTE — Assessment & Plan Note (Signed)
His TSH is on the normal range Will stay on the current dose

## 2014-08-10 NOTE — Assessment & Plan Note (Signed)
Biopsy done, await results Will try a higher potency steroid

## 2014-08-10 NOTE — Assessment & Plan Note (Signed)
Cont nsaids Refer to PT for treatment

## 2014-08-14 ENCOUNTER — Other Ambulatory Visit: Payer: Self-pay | Admitting: Internal Medicine

## 2014-08-14 ENCOUNTER — Encounter: Payer: Self-pay | Admitting: Internal Medicine

## 2014-08-14 ENCOUNTER — Ambulatory Visit (INDEPENDENT_AMBULATORY_CARE_PROVIDER_SITE_OTHER): Payer: MEDICARE | Admitting: Internal Medicine

## 2014-08-14 VITALS — BP 122/76 | HR 71 | Temp 98.5°F | Resp 16 | Ht 70.0 in | Wt 208.0 lb

## 2014-08-14 DIAGNOSIS — L239 Allergic contact dermatitis, unspecified cause: Secondary | ICD-10-CM

## 2014-08-14 DIAGNOSIS — R21 Rash and other nonspecific skin eruption: Secondary | ICD-10-CM

## 2014-08-14 DIAGNOSIS — L259 Unspecified contact dermatitis, unspecified cause: Secondary | ICD-10-CM

## 2014-08-14 MED ORDER — DOXEPIN HCL 25 MG PO CAPS: 25.0000 mg | ORAL_CAPSULE | Freq: Every evening | ORAL | Status: DC | PRN

## 2014-08-14 NOTE — Patient Instructions (Signed)
Eczema Eczema, also called atopic dermatitis, is a skin disorder that causes inflammation of the skin. It causes a red rash and dry, scaly skin. The skin becomes very itchy. Eczema is generally worse during the cooler winter months and often improves with the warmth of summer. Eczema usually starts showing signs in infancy. Some children outgrow eczema, but it may last through adulthood.  CAUSES  The exact cause of eczema is not known, but it appears to run in families. People with eczema often have a family history of eczema, allergies, asthma, or hay fever. Eczema is not contagious. Flare-ups of the condition may be caused by:   Contact with something you are sensitive or allergic to.   Stress. SIGNS AND SYMPTOMS  Dry, scaly skin.   Red, itchy rash.   Itchiness. This may occur before the skin rash and may be very intense.  DIAGNOSIS  The diagnosis of eczema is usually made based on symptoms and medical history. TREATMENT  Eczema cannot be cured, but symptoms usually can be controlled with treatment and other strategies. A treatment plan might include:  Controlling the itching and scratching.   Use over-the-counter antihistamines as directed for itching. This is especially useful at night when the itching tends to be worse.   Use over-the-counter steroid creams as directed for itching.   Avoid scratching. Scratching makes the rash and itching worse. It may also result in a skin infection (impetigo) due to a break in the skin caused by scratching.   Keeping the skin well moisturized with creams every day. This will seal in moisture and help prevent dryness. Lotions that contain alcohol and water should be avoided because they can dry the skin.   Limiting exposure to things that you are sensitive or allergic to (allergens).   Recognizing situations that cause stress.   Developing a plan to manage stress.  HOME CARE INSTRUCTIONS   Only take over-the-counter or  prescription medicines as directed by your health care provider.   Do not use anything on the skin without checking with your health care provider.   Keep baths or showers short (5 minutes) in warm (not hot) water. Use mild cleansers for bathing. These should be unscented. You may add nonperfumed bath oil to the bath water. It is best to avoid soap and bubble bath.   Immediately after a bath or shower, when the skin is still damp, apply a moisturizing ointment to the entire body. This ointment should be a petroleum ointment. This will seal in moisture and help prevent dryness. The thicker the ointment, the better. These should be unscented.   Keep fingernails cut short. Children with eczema may need to wear soft gloves or mittens at night after applying an ointment.   Dress in clothes made of cotton or cotton blends. Dress lightly, because heat increases itching.   A child with eczema should stay away from anyone with fever blisters or cold sores. The virus that causes fever blisters (herpes simplex) can cause a serious skin infection in children with eczema. SEEK MEDICAL CARE IF:   Your itching interferes with sleep.   Your rash gets worse or is not better within 1 week after starting treatment.   You see pus or soft yellow scabs in the rash area.   You have a fever.   You have a rash flare-up after contact with someone who has fever blisters.  Document Released: 11/11/2000 Document Revised: 09/04/2013 Document Reviewed: 06/17/2013 Exit[employer] Patient Information 2015 [employer]. This information   is not intended to replace advice given to you by your health care provider. Make sure you discuss any questions you have with your health care provider.  

## 2014-08-14 NOTE — Progress Notes (Signed)
   Subjective:    Patient ID: Ronald Bond, male    DOB: 1946-07-13, 68 y.o.   MRN: [id number]  Rash This is a recurrent problem. The current episode started more than 1 year ago. The problem is unchanged. The affected locations include the left lower leg and right lower leg. The rash is characterized by scaling and itchiness. He was exposed to nothing. Pertinent negatives include no anorexia, congestion, cough, diarrhea, eye pain, facial edema, fatigue, fever, joint pain, nail changes, rhinorrhea, shortness of breath, sore throat or vomiting. Past treatments include topical steroids. The treatment provided mild relief.      Review of Systems  Constitutional: Negative.  Negative for fever, chills, diaphoresis, appetite change and fatigue.  HENT: Negative.  Negative for congestion, rhinorrhea and sore throat.   Eyes: Negative.  Negative for pain.  Respiratory: Negative.  Negative for cough, chest tightness, shortness of breath and stridor.   Cardiovascular: Negative.  Negative for chest pain, palpitations and leg swelling.  Gastrointestinal: Negative.  Negative for nausea, vomiting, abdominal pain, diarrhea, constipation, blood in stool and anorexia.  Endocrine: Negative.   Genitourinary: Negative.   Musculoskeletal: Negative.  Negative for arthralgias, back pain, joint pain, joint swelling, myalgias and neck pain.  Skin: Positive for rash. Negative for nail changes, color change, pallor and wound.  Allergic/Immunologic: Negative.   Neurological: Negative.   Hematological: Negative.  Negative for adenopathy. Does not bruise/bleed easily.  Psychiatric/Behavioral: Negative.        Objective:   Physical Exam  Vitals reviewed. Constitutional: He is oriented to person, place, and time. He appears well-developed and well-nourished. No distress.  HENT:  Head: Normocephalic and atraumatic.  Mouth/Throat: Oropharynx is clear and moist. No oropharyngeal exudate.  Eyes: Conjunctivae are  normal. Right eye exhibits no discharge. Left eye exhibits no discharge. No scleral icterus.  Neck: Normal range of motion. Neck supple. No JVD present. No tracheal deviation present. No thyromegaly present.  Cardiovascular: Normal rate, regular rhythm, normal heart sounds and intact distal pulses.  Exam reveals no gallop and no friction rub.   No murmur heard. Pulmonary/Chest: Effort normal and breath sounds normal. No stridor. No respiratory distress. He has no wheezes. He has no rales. He exhibits no tenderness.  Abdominal: Soft. Bowel sounds are normal. He exhibits no distension and no mass. There is no tenderness. There is no rebound and no guarding.  Musculoskeletal: Normal range of motion. He exhibits no edema and no tenderness.  Lymphadenopathy:    He has no cervical adenopathy.  Neurological: He is oriented to person, place, and time.  Skin: Skin is warm and dry. Rash noted. No purpura noted. Rash is maculopapular. Rash is not macular, not papular, not nodular, not pustular, not vesicular and not urticarial. He is not diaphoretic. No erythema. No pallor.     Psychiatric: He has a normal mood and affect. His behavior is normal. Judgment and thought content normal.          Assessment & Plan:

## 2014-08-15 ENCOUNTER — Ambulatory Visit: Payer: MEDICARE | Admitting: Internal Medicine

## 2014-08-15 NOTE — Assessment & Plan Note (Signed)
Will refer to derm for further evaluation

## 2014-08-15 NOTE — Assessment & Plan Note (Signed)
He will cont the topical steroids Will also start doxepin for the itching

## 2014-09-02 ENCOUNTER — Ambulatory Visit: Payer: MEDICARE | Admitting: Internal Medicine

## 2014-09-08 DIAGNOSIS — L03031 Cellulitis of right toe: Secondary | ICD-10-CM | POA: Diagnosis not present

## 2014-09-10 ENCOUNTER — Ambulatory Visit: Payer: MEDICARE

## 2014-09-16 ENCOUNTER — Ambulatory Visit: Payer: MEDICARE | Attending: Internal Medicine

## 2015-01-19 ENCOUNTER — Other Ambulatory Visit: Payer: Self-pay | Admitting: Internal Medicine

## 2015-02-27 ENCOUNTER — Encounter: Payer: Self-pay | Admitting: Internal Medicine

## 2015-02-27 ENCOUNTER — Other Ambulatory Visit (INDEPENDENT_AMBULATORY_CARE_PROVIDER_SITE_OTHER): Payer: MEDICARE

## 2015-02-27 ENCOUNTER — Ambulatory Visit (INDEPENDENT_AMBULATORY_CARE_PROVIDER_SITE_OTHER): Payer: MEDICARE | Admitting: Internal Medicine

## 2015-02-27 VITALS — BP 138/82 | HR 60 | Temp 98.1°F | Resp 16 | Wt 221.0 lb

## 2015-02-27 DIAGNOSIS — E038 Other specified hypothyroidism: Secondary | ICD-10-CM

## 2015-02-27 DIAGNOSIS — L239 Allergic contact dermatitis, unspecified cause: Secondary | ICD-10-CM

## 2015-02-27 DIAGNOSIS — L200 Besnier's prurigo: Secondary | ICD-10-CM

## 2015-02-27 DIAGNOSIS — I10 Essential (primary) hypertension: Secondary | ICD-10-CM

## 2015-02-27 LAB — CBC WITH DIFFERENTIAL/PLATELET
BASOS ABS: 0.1 10*3/uL (ref 0.0–0.1)
BASOS PCT: 0.8 % (ref 0.0–3.0)
EOS PCT: 3.1 % (ref 0.0–5.0)
Eosinophils Absolute: 0.2 10*3/uL (ref 0.0–0.7)
HCT: 39.1 % (ref 39.0–52.0)
Hemoglobin: 13 g/dL (ref 13.0–17.0)
LYMPHS ABS: 1.6 10*3/uL (ref 0.7–4.0)
Lymphocytes Relative: 20.8 % (ref 12.0–46.0)
MCHC: 33.4 g/dL (ref 30.0–36.0)
MCV: 89.7 fl (ref 78.0–100.0)
MONOS PCT: 8.6 % (ref 3.0–12.0)
Monocytes Absolute: 0.7 10*3/uL (ref 0.1–1.0)
Neutro Abs: 5.1 10*3/uL (ref 1.4–7.7)
Neutrophils Relative %: 66.7 % (ref 43.0–77.0)
PLATELETS: 243 10*3/uL (ref 150.0–400.0)
RBC: 4.36 Mil/uL (ref 4.22–5.81)
RDW: 14.8 % (ref 11.5–15.5)
WBC: 7.7 10*3/uL (ref 4.0–10.5)

## 2015-02-27 LAB — BASIC METABOLIC PANEL
BUN: 15 mg/dL (ref 6–23)
CHLORIDE: 102 meq/L (ref 96–112)
CO2: 32 meq/L (ref 19–32)
CREATININE: 1.45 mg/dL (ref 0.40–1.50)
Calcium: 9.7 mg/dL (ref 8.4–10.5)
GFR: 62.01 mL/min (ref >60.00)
Glucose, Bld: 96 mg/dL (ref 70–99)
Potassium: 3.6 mEq/L (ref 3.5–5.1)
Sodium: 139 mEq/L (ref 135–145)

## 2015-02-27 LAB — TSH: TSH: 7.96 u[IU]/mL — AB (ref 0.35–4.50)

## 2015-02-27 MED ORDER — MOMETASONE FUROATE 0.1 % EX OINT: TOPICAL_OINTMENT | Freq: Every day | CUTANEOUS | Status: DC

## 2015-02-27 NOTE — Assessment & Plan Note (Signed)
I will recheck his TSh and will adjust his dose if needed 

## 2015-02-27 NOTE — Progress Notes (Signed)
Pre visit review using our clinic review tool, if applicable. No additional management support is needed unless otherwise documented below in the visit note. 

## 2015-02-27 NOTE — Progress Notes (Signed)
Subjective:    Patient ID: Ronald Bond, male    DOB: 1946/04/30, 69 y.o.   MRN: [id number]  Thyroid Problem Presents for follow-up visit. Symptoms include fatigue and weight gain. Patient reports no anxiety, cold intolerance, constipation, depressed mood, diaphoresis, diarrhea, dry skin, hair loss, heat intolerance, hoarse voice, leg swelling, nail problem, palpitations, tremors, visual change or weight loss. The symptoms have been stable. Past treatments include levothyroxine. The treatment provided mild relief.      Review of Systems  Constitutional: Positive for weight gain and fatigue. Negative for fever, chills, weight loss, diaphoresis, appetite change and unexpected weight change.  HENT: Negative.  Negative for hoarse voice.   Eyes: Negative.   Respiratory: Negative.  Negative for cough, choking, chest tightness, shortness of breath and stridor.   Cardiovascular: Negative.  Negative for chest pain, palpitations and leg swelling.  Gastrointestinal: Negative.  Negative for nausea, vomiting, abdominal pain, diarrhea and constipation.  Endocrine: Negative.  Negative for cold intolerance and heat intolerance.  Genitourinary: Negative.   Musculoskeletal: Negative.   Skin: Positive for rash.       There are itchy lesion on his LE's, L>R, he has not been using any steroid creams  Allergic/Immunologic: Negative.   Neurological: Negative.  Negative for dizziness, tremors, light-headedness and numbness.  Hematological: Negative.  Negative for adenopathy. Does not bruise/bleed easily.  Psychiatric/Behavioral: Negative.  The patient is not nervous/anxious.        Objective:   Physical Exam  Constitutional: He is oriented to person, place, and time. He appears well-developed and well-nourished. No distress.  HENT:  Head: Normocephalic and atraumatic.  Mouth/Throat: Oropharynx is clear and moist. No oropharyngeal exudate.  Eyes: Conjunctivae are normal. Right eye exhibits no  discharge. Left eye exhibits no discharge. No scleral icterus.  Neck: Normal range of motion. Neck supple. No JVD present. No tracheal deviation present. No thyromegaly present.  Cardiovascular: Normal rate, regular rhythm, normal heart sounds and intact distal pulses.  Exam reveals no gallop and no friction rub.   No murmur heard. Pulmonary/Chest: Effort normal and breath sounds normal. No stridor. No respiratory distress. He has no wheezes. He has no rales. He exhibits no tenderness.  Abdominal: Soft. Bowel sounds are normal. He exhibits no distension and no mass. There is no tenderness. There is no rebound and no guarding.  Musculoskeletal: Normal range of motion. He exhibits no edema or tenderness.  Lymphadenopathy:    He has no cervical adenopathy.  Neurological: He is oriented to person, place, and time.  Skin: Skin is warm and dry. Rash noted. No purpura noted. Rash is macular. Rash is not papular, not maculopapular, not nodular, not pustular, not vesicular and not urticarial. He is not diaphoretic. No erythema. No pallor.  Over the LE's there are macules with scale and various degrees of pigmentation  Vitals reviewed.    Lab Results  Component Value Date   WBC 10.1 10/21/2013   HGB 14.6 11/18/2013   HCT 41.1 10/21/2013   PLT 251.0 10/21/2013   GLUCOSE 85 08/07/2014   CHOL 270* 08/07/2014   TRIG 130.0 08/07/2014   HDL 62.10 08/07/2014   LDLDIRECT 180.8 04/02/2013   LDLCALC 182* 08/07/2014   ALT 24 10/21/2013   AST 23 10/21/2013   NA 139 08/07/2014   K 3.8 08/07/2014   CL 102 08/07/2014   CREATININE 1.3 08/07/2014   BUN 14 08/07/2014   CO2 31 08/07/2014   TSH 31.33* 08/07/2014   PSA 1.66 04/02/2013  INR 0.9 10/17/2007   HGBA1C 6.1 04/02/2013       Assessment & Plan:

## 2015-02-27 NOTE — Patient Instructions (Signed)
Hypothyroidism The thyroid is a large gland located in the lower front of your neck. The thyroid gland helps control metabolism. Metabolism is how your body handles food. It controls metabolism with the hormone thyroxine. When this gland is underactive (hypothyroid), it produces too little hormone.  CAUSES These include:   Absence or destruction of thyroid tissue.  Goiter due to iodine deficiency.  Goiter due to medications.  Congenital defects (since birth).  Problems with the pituitary. This causes a lack of TSH (thyroid stimulating hormone). This hormone tells the thyroid to turn out more hormone. SYMPTOMS  Lethargy (feeling as though you have no energy)  Cold intolerance  Weight gain (in spite of normal food intake)  Dry skin  Coarse hair  Menstrual irregularity (if severe, may lead to infertility)  Slowing of thought processes Cardiac problems are also caused by insufficient amounts of thyroid hormone. Hypothyroidism in the newborn is cretinism, and is an extreme form. It is important that this form be treated adequately and immediately or it will lead rapidly to retarded physical and mental development. DIAGNOSIS  To prove hypothyroidism, your caregiver may do blood tests and ultrasound tests. Sometimes the signs are hidden. It may be necessary for your caregiver to watch this illness with blood tests either before or after diagnosis and treatment. TREATMENT  Low levels of thyroid hormone are increased by using synthetic thyroid hormone. This is a safe, effective treatment. It usually takes about four weeks to gain the full effects of the medication. After you have the full effect of the medication, it will generally take another four weeks for problems to leave. Your caregiver may start you on low doses. If you have had heart problems the dose may be gradually increased. It is generally not an emergency to get rapidly to normal. HOME CARE INSTRUCTIONS   Take your  medications as your caregiver suggests. Let your caregiver know of any medications you are taking or start taking. Your caregiver will help you with dosage schedules.  As your condition improves, your dosage needs may increase. It will be necessary to have continuing blood tests as suggested by your caregiver.  Report all suspected medication side effects to your caregiver. SEEK MEDICAL CARE IF: Seek medical care if you develop:  Sweating.  Tremulousness (tremors).  Anxiety.  Rapid weight loss.  Heat intolerance.  Emotional swings.  Diarrhea.  Weakness. SEEK IMMEDIATE MEDICAL CARE IF:  You develop chest pain, an irregular heart beat (palpitations), or a rapid heart beat. MAKE SURE YOU:   Understand these instructions.  Will watch your condition.  Will get help right away if you are not doing well or get worse. Document Released: 11/14/2005 Document Revised: 02/06/2012 Document Reviewed: 07/04/2008 [employer] Patient Information 2015 [employer]. This information is not intended to replace advice given to you by your health care provider. Make sure you discuss any questions you have with your health care provider.  

## 2015-02-27 NOTE — Assessment & Plan Note (Signed)
Will start treating with a high potency steroid ointment

## 2015-02-27 NOTE — Assessment & Plan Note (Signed)
His BP is well controlled Will monitor his lytes and renal function 

## 2015-03-02 ENCOUNTER — Encounter: Payer: Self-pay | Admitting: Internal Medicine

## 2015-03-02 MED ORDER — LEVOTHYROXINE SODIUM 150 MCG PO TABS: 150.0000 ug | ORAL_TABLET | Freq: Every day | ORAL | Status: DC

## 2015-03-02 NOTE — Addendum Note (Signed)
Addended by: Janith Lima on: 03/02/2015 07:21 AM   Modules accepted: Orders, Medications

## 2015-03-03 ENCOUNTER — Telehealth: Payer: Self-pay | Admitting: Internal Medicine

## 2015-03-03 NOTE — Telephone Encounter (Signed)
Patient is requesting pneumonia vac and tetanus/tdap.  Please advise.

## 2015-03-03 NOTE — Telephone Encounter (Signed)
Spoke with patient and nurse visit schedule to receive tdap and pneumonia.

## 2015-03-09 ENCOUNTER — Ambulatory Visit: Payer: MEDICARE

## 2015-04-02 ENCOUNTER — Encounter: Payer: Self-pay | Admitting: Internal Medicine

## 2015-04-02 ENCOUNTER — Ambulatory Visit (INDEPENDENT_AMBULATORY_CARE_PROVIDER_SITE_OTHER): Payer: MEDICARE | Admitting: Internal Medicine

## 2015-04-02 VITALS — BP 130/66 | HR 59 | Temp 98.5°F | Ht 70.0 in | Wt 217.2 lb

## 2015-04-02 DIAGNOSIS — K2100 Gastro-esophageal reflux disease with esophagitis, without bleeding: Secondary | ICD-10-CM

## 2015-04-02 DIAGNOSIS — R0789 Other chest pain: Secondary | ICD-10-CM

## 2015-04-02 DIAGNOSIS — E785 Hyperlipidemia, unspecified: Secondary | ICD-10-CM

## 2015-04-02 DIAGNOSIS — G4733 Obstructive sleep apnea (adult) (pediatric): Secondary | ICD-10-CM

## 2015-04-02 DIAGNOSIS — Z8249 Family history of ischemic heart disease and other diseases of the circulatory system: Secondary | ICD-10-CM

## 2015-04-02 NOTE — Progress Notes (Signed)
Pre visit review using our clinic review tool, if applicable. No additional management support is needed unless otherwise documented below in the visit note. 

## 2015-04-02 NOTE — Progress Notes (Signed)
   Subjective:    Patient ID: Ronald Bond, male    DOB: [birth year], 70 y.o.   MRN: [id number]  HPI He has had intermittent chest pain at rest for a month. It typically occurs when he is under stress. The pain is described as aching in the left side of the chest lasting seconds and not associated with radiation, nausea, or diaphoresis.   The last episode was last week when he was at his sister's funeral. She was 47 & "died of old age".  He is  No on a heart healthy diet.  He does not smoke.His last LDL was 182 in 9/15.Marland Kitchen He previously had been on atorvastatin but this was stopped because of a "rash" on his legs.  His father had heart attack at 27; a brother stroke at 67; & another brother died with heart disease at 84.    He has a history of sleep apnea but is not on CPAP.  His most recent TSH was 7.96 on 4/1. TSH had been 31.33 in September 2015. He states 2 weeks ago his thyroid was increased from 125 to150 g daily.   He is compliant with his blood pressure medicines. He is not monitoring blood pressure at home.   He does have occasional dysphagia with food and occasionally with pills at times. CBC and differential was normal 02/27/15.  Review of Systems  He has occasional shortness of breath which is unassociated with chest pain.    He does have some dyspepsia despite taking Nexium.   He denies abdominal pain, unexplained weight loss, melena, rectal bleeding.   He denies exertional dyspnea, paroxysmal nocturnal dyspnea, claudication, or edema.    Objective:   Physical Exam Pertinent or positive findings include : there is some increased hyperemia of the sclera.  The oropharynx is crowded.  He has a slow S4 with slight slurring.  Abdomen is protuberant.  Crepitus of the knees is noted. Homans sign is negative  General appearance :adequately nourished; in no distress. Eyes: No conjunctival inflammation or scleral icterus is present. Oral exam:  Lips and gums are healthy  appearing.There is no oropharyngeal erythema or exudate noted. Dental hygiene is good. Heart:  Normal rate and regular rhythm. S1 and S2 normal without gallop, murmur, click,or rub. Lungs:Chest clear to auscultation; no wheezes, rhonchi,rales ,or rubs present.No increased work of breathing.  Abdomen: bowel sounds normal, soft and non-tender without masses, organomegaly or hernias noted.  No guarding or rebound. Vascular : all pulses equal ; no bruits present. Skin:Warm & dry.  Intact without suspicious lesions or rashes ; no tenting  Lymphatic: No lymphadenopathy is noted about the head, neck, axilla Neuro: Strength, tone & DTRs normal.       Assessment & Plan:   #1 chest pain at rest when under stress particularly  #2 dyslipidemia, untreated  #3 hypothyroidism , being actively treated  #4 strong family history of coronary disease including premature events   Plan: EKG : essentially unchanged since 06/03/13   NMR LipoProfile   Antireflux therapy  Stress testing based on results

## 2015-04-02 NOTE — Patient Instructions (Addendum)
  Your next office appointment will be determined based upon review of your pending labs . Those instructions will be transmitted to you by My Chart. Critical results will be called.  Followup as needed for any active or acute issue. Please report any significant change in your symptoms.  Reflux of gastric acid may be asymptomatic as this may occur mainly during sleep.The triggers for reflux  include stress; the "aspirin family" ; alcohol; peppermint; and caffeine (coffee, tea, cola, and chocolate). The aspirin family would include aspirin and the nonsteroidal agents such as ibuprofen &  Naproxen. Tylenol would not cause reflux. If having symptoms ; food & drink should be avoided for @ least 2 hours before going to bed.  Take the Nexium 30 minutes before breakfast and 30 minutes before the evening meal for 8 weeks then go back to once a day  30 minutes before breakfast.

## 2015-04-17 ENCOUNTER — Other Ambulatory Visit: Payer: MEDICARE

## 2015-04-17 ENCOUNTER — Other Ambulatory Visit: Payer: Self-pay | Admitting: Internal Medicine

## 2015-04-17 DIAGNOSIS — E785 Hyperlipidemia, unspecified: Secondary | ICD-10-CM | POA: Diagnosis not present

## 2015-04-17 DIAGNOSIS — Z8249 Family history of ischemic heart disease and other diseases of the circulatory system: Secondary | ICD-10-CM

## 2015-04-20 LAB — NMR LIPOPROFILE WITH LIPIDS
Cholesterol, Total: 284 mg/dL — ABNORMAL HIGH (ref 100–199)
HDL Particle Number: 28.5 umol/L — ABNORMAL LOW (ref >=30.5)
HDL Size: 9.1 nm — ABNORMAL LOW (ref >=9.2)
HDL-C: 58 mg/dL (ref >39)
LDL (calc): 193 mg/dL — ABNORMAL HIGH (ref 0–99)
LDL PARTICLE NUMBER: 2133 nmol/L — AB (ref <1000)
LDL Size: 20.5 nm (ref >=20.8)
LP-IR SCORE: 31 (ref <=45)
Large HDL-P: 5.6 umol/L (ref >=4.8)
Large VLDL-P: 0.9 nmol/L (ref <=2.7)
SMALL LDL PARTICLE NUMBER: 1053 nmol/L — AB (ref <=527)
TRIGLYCERIDES: 166 mg/dL — AB (ref 0–149)
VLDL SIZE: 39.7 nm (ref <=46.6)

## 2015-05-25 ENCOUNTER — Other Ambulatory Visit: Payer: Self-pay

## 2015-06-28 DIAGNOSIS — J209 Acute bronchitis, unspecified: Secondary | ICD-10-CM | POA: Diagnosis not present

## 2015-07-30 ENCOUNTER — Other Ambulatory Visit: Payer: Self-pay | Admitting: Internal Medicine

## 2015-08-17 DIAGNOSIS — I10 Essential (primary) hypertension: Secondary | ICD-10-CM | POA: Diagnosis not present

## 2015-08-17 DIAGNOSIS — G44209 Tension-type headache, unspecified, not intractable: Secondary | ICD-10-CM | POA: Diagnosis not present

## 2015-08-17 DIAGNOSIS — M62838 Other muscle spasm: Secondary | ICD-10-CM | POA: Diagnosis not present

## 2015-08-19 DIAGNOSIS — G44209 Tension-type headache, unspecified, not intractable: Secondary | ICD-10-CM | POA: Diagnosis not present

## 2015-08-19 DIAGNOSIS — M62838 Other muscle spasm: Secondary | ICD-10-CM | POA: Diagnosis not present

## 2015-08-19 DIAGNOSIS — M5030 Other cervical disc degeneration, unspecified cervical region: Secondary | ICD-10-CM | POA: Diagnosis not present

## 2015-08-19 DIAGNOSIS — I10 Essential (primary) hypertension: Secondary | ICD-10-CM | POA: Diagnosis not present

## 2015-08-25 ENCOUNTER — Ambulatory Visit (INDEPENDENT_AMBULATORY_CARE_PROVIDER_SITE_OTHER): Payer: MEDICARE | Admitting: Internal Medicine

## 2015-08-25 ENCOUNTER — Other Ambulatory Visit (INDEPENDENT_AMBULATORY_CARE_PROVIDER_SITE_OTHER): Payer: MEDICARE

## 2015-08-25 ENCOUNTER — Encounter: Payer: Self-pay | Admitting: Internal Medicine

## 2015-08-25 VITALS — BP 130/80 | HR 56 | Temp 98.3°F | Resp 16 | Ht 70.0 in | Wt 219.0 lb

## 2015-08-25 DIAGNOSIS — E038 Other specified hypothyroidism: Secondary | ICD-10-CM

## 2015-08-25 DIAGNOSIS — Z23 Encounter for immunization: Secondary | ICD-10-CM

## 2015-08-25 DIAGNOSIS — I10 Essential (primary) hypertension: Secondary | ICD-10-CM | POA: Diagnosis not present

## 2015-08-25 DIAGNOSIS — M4802 Spinal stenosis, cervical region: Secondary | ICD-10-CM | POA: Insufficient documentation

## 2015-08-25 DIAGNOSIS — M5412 Radiculopathy, cervical region: Secondary | ICD-10-CM

## 2015-08-25 LAB — BASIC METABOLIC PANEL
BUN: 18 mg/dL (ref 6–23)
CALCIUM: 9.3 mg/dL (ref 8.4–10.5)
CHLORIDE: 99 meq/L (ref 96–112)
CO2: 33 mEq/L — ABNORMAL HIGH (ref 19–32)
CREATININE: 1.58 mg/dL — AB (ref 0.40–1.50)
GFR: 56.08 mL/min — ABNORMAL LOW (ref >60.00)
Glucose, Bld: 100 mg/dL — ABNORMAL HIGH (ref 70–99)
Potassium: 3.8 mEq/L (ref 3.5–5.1)
Sodium: 137 mEq/L (ref 135–145)

## 2015-08-25 LAB — TSH: TSH: 7.46 u[IU]/mL — AB (ref 0.35–4.50)

## 2015-08-25 MED ORDER — BACLOFEN 10 MG PO TABS: ORAL_TABLET | ORAL | Status: DC

## 2015-08-25 MED ORDER — HYDROCODONE-IBUPROFEN 7.5-200 MG PO TABS: 1.0000 | ORAL_TABLET | Freq: Four times a day (QID) | ORAL | Status: DC | PRN

## 2015-08-25 NOTE — Patient Instructions (Signed)

## 2015-08-25 NOTE — Progress Notes (Signed)
Pre visit review using our clinic review tool, if applicable. No additional management support is needed unless otherwise documented below in the visit note. 

## 2015-08-26 ENCOUNTER — Encounter: Payer: Self-pay | Admitting: Internal Medicine

## 2015-08-26 MED ORDER — LEVOTHYROXINE SODIUM 175 MCG PO TABS: 175.0000 ug | ORAL_TABLET | Freq: Every day | ORAL | Status: DC

## 2015-08-26 NOTE — Progress Notes (Signed)
Subjective:  Patient ID: Ronald Bond, male    DOB: Aug 26, 1946  Age: 69 y.o. MRN: [id number]  CC: Hypothyroidism and Neck Pain   HPI Ronald Bond presents for f/up on hypoT but he also complains of right sided neck pain for about 1 month, the pain radiates into his right shoulder and there is weakness in his right hand and tingling in his RUE. He was seen at an [facility] recently and had plain films done of his C-spine that were abnormal ("collapse at C2-3 and C3-4"). He was started on meds for pain and that has helped.  Outpatient Prescriptions Prior to Visit  Medication Sig Dispense Refill  . amLODipine (NORVASC) 10 MG tablet TAKE 1 TABLET BY MOUTH DAILY 90 tablet 3  . aspirin 81 MG EC tablet Take 81 mg by mouth daily.      Marland Kitchen atorvastatin (LIPITOR) 40 MG tablet TAKE 1 TABLET BY MOUTH DAILY. 90 tablet 3  . doxepin (SINEQUAN) 25 MG capsule Take 1 capsule (25 mg total) by mouth at bedtime as needed. 30 capsule 11  . esomeprazole (NEXIUM) 40 MG capsule Take 1 capsule (40 mg total) by mouth daily. 180 capsule 3  . hydrochlorothiazide (HYDRODIURIL) 25 MG tablet TAKE 1 TABLET BY MOUTH DAILY 90 tablet 3  . losartan (COZAAR) 100 MG tablet TAKE 1 TABLET BY MOUTH DAILY 90 tablet 3  . mometasone (ELOCON) 0.1 % ointment Apply topically daily. 45 g 3  . mometasone-formoterol (DULERA) 100-5 MCG/ACT AERO Inhale 2 puffs into the lungs 2 (two) times daily.    . Multiple Vitamins-Minerals (GNP MEGA MULTI FOR MEN) TABS Take 1 tablet by mouth daily.    . potassium chloride SA (K-DUR,KLOR-CON) 20 MEQ tablet TAKE 1 TABLET BY MOUTH TWICE DAILY 180 tablet 3  . Probiotic Product (ALIGN) 4 MG CAPS Take 1 capsule by mouth daily.     . Flavocoxid-Cit Zn Bisglcinate (LIMBREL500) 500-50 MG CAPS Take 1 capsule by mouth 2 (two) times daily. 60 capsule 11  . hydrOXYzine (ATARAX/VISTARIL) 10 MG tablet Take 10 mg by mouth 3 (three) times daily as needed for itching.    . levothyroxine (SYNTHROID, LEVOTHROID) 150 MCG  tablet Take 1 tablet (150 mcg total) by mouth daily. 90 tablet 1  . meloxicam (MOBIC) 7.5 MG tablet Take 1 tablet (7.5 mg total) by mouth daily. 90 tablet 2  . mometasone (NASONEX) 50 MCG/ACT nasal spray Place 2 sprays into the nose daily.     No facility-administered medications prior to visit.    ROS Review of Systems  Constitutional: Negative for fever, chills, diaphoresis, appetite change, fatigue and unexpected weight change.  HENT: Negative.   Eyes: Negative.   Respiratory: Negative.  Negative for cough, choking, chest tightness, shortness of breath and stridor.   Cardiovascular: Negative.  Negative for chest pain and palpitations.  Gastrointestinal: Negative.  Negative for nausea, vomiting, abdominal pain, diarrhea, constipation and blood in stool.  Endocrine: Negative.   Genitourinary: Negative.   Musculoskeletal: Positive for neck pain. Negative for myalgias, back pain, joint swelling and arthralgias.  Skin: Negative.   Allergic/Immunologic: Negative.   Neurological: Positive for weakness. Negative for dizziness, tremors, seizures, syncope, facial asymmetry, speech difficulty, light-headedness, numbness and headaches.  Hematological: Negative.  Negative for adenopathy. Does not bruise/bleed easily.  Psychiatric/Behavioral: Negative.     Objective:  BP 130/80 mmHg  Pulse 56  Temp(Src) 98.3 F (36.8 C) (Oral)  Resp 16  Ht 5\' 10"  (1.778 m)  Wt 219 lb (99.338 kg)  BMI 31.42 kg/m2  SpO2 98%  BP Readings from Last 3 Encounters:  08/25/15 130/80  04/02/15 130/66  02/27/15 138/82    Wt Readings from Last 3 Encounters:  08/25/15 219 lb (99.338 kg)  04/02/15 217 lb 4 oz (98.544 kg)  02/27/15 221 lb (100.245 kg)    Physical Exam  Constitutional: No distress.  HENT:  Mouth/Throat: Oropharynx is clear and moist. No oropharyngeal exudate.  Eyes: Conjunctivae are normal. Right eye exhibits no discharge. Left eye exhibits no discharge. No scleral icterus.  Neck: Normal  range of motion. Neck supple. No JVD present. No tracheal deviation present. No thyromegaly present.  Cardiovascular: Normal rate, regular rhythm, normal heart sounds and intact distal pulses.  Exam reveals no gallop and no friction rub.   No murmur heard. Pulmonary/Chest: Effort normal and breath sounds normal. No stridor. No respiratory distress. He has no wheezes. He has no rales. He exhibits no tenderness.  Abdominal: Soft. Bowel sounds are normal. He exhibits no distension and no mass. There is no tenderness. There is no rebound and no guarding.  Musculoskeletal: Normal range of motion. He exhibits no edema or tenderness.       Cervical back: Normal. He exhibits normal range of motion, no tenderness, no bony tenderness, no swelling, no edema, no deformity, no laceration, no pain, no spasm and normal pulse.  Lymphadenopathy:    He has no cervical adenopathy.  Neurological: He is alert. He displays no atrophy, no tremor and normal reflexes. No cranial nerve deficit or sensory deficit. He exhibits abnormal muscle tone. He displays a negative Romberg sign. He displays no seizure activity. Coordination and gait normal.  Reflex Scores:      Tricep reflexes are 1+ on the right side and 1+ on the left side.      Bicep reflexes are 1+ on the right side and 1+ on the left side.      Brachioradialis reflexes are 1+ on the right side and 1+ on the left side.      Patellar reflexes are 1+ on the right side and 1+ on the left side.      Achilles reflexes are 1+ on the right side and 1+ on the left side. There is mild diffuse weakness in his RUE  Skin: Skin is warm and dry. No rash noted. He is not diaphoretic. No erythema. No pallor.  Psychiatric: He has a normal mood and affect. His behavior is normal. Judgment and thought content normal.  Vitals reviewed.   Lab Results  Component Value Date   WBC 7.7 02/27/2015   HGB 13.0 02/27/2015   HCT 39.1 02/27/2015   PLT 243.0 02/27/2015   GLUCOSE 100*  08/25/2015   CHOL 284* 04/17/2015   TRIG 166* 04/17/2015   HDL 58 04/17/2015   LDLDIRECT 180.8 04/02/2013   LDLCALC 193* 04/17/2015   ALT 24 10/21/2013   AST 23 10/21/2013   NA 137 08/25/2015   K 3.8 08/25/2015   CL 99 08/25/2015   CREATININE 1.58* 08/25/2015   BUN 18 08/25/2015   CO2 33* 08/25/2015   TSH 7.46* 08/25/2015   PSA 1.66 04/02/2013   INR 0.9 10/17/2007   HGBA1C 6.1 04/02/2013    Dg Elbow Complete Right  08/07/2014   CLINICAL DATA:  pain, no injury  EXAM: RIGHT ELBOW - COMPLETE 3+ VIEW  COMPARISON:  None.  FINDINGS: Normal alignment no fracture. Degenerative change in the medial joint with medial spurring of the olecranon and coronoid process. No joint effusion.  IMPRESSION: Medial joint osteoarthritis.  Negative for fracture.   Electronically Signed   By: Franchot Gallo M.D.   On: 08/07/2014 10:38    Assessment & Plan:   Timotheus was seen today for hypothyroidism and neck pain.  Diagnoses and all orders for this visit:  Other specified hypothyroidism- his TSH is slightly elevated, will increase his synthroid dose -     TSH; Future -     levothyroxine (SYNTHROID) 175 MCG tablet; Take 1 tablet (175 mcg total) by mouth daily before breakfast.  Essential hypertension, benign- his BP is well controlled -     Basic metabolic panel; Future  Radiculitis of right cervical region- he has radicular symptoms and an abnormal plain film, will get an MRI to see if there is spinal stenosis, HNP, nerve impingement, tumor, spurring, etc -     baclofen (LIORESAL) 10 MG tablet; TK 1 T PO BID -     HYDROcodone-ibuprofen (VICOPROFEN) 7.5-200 MG tablet; Take 1 tablet by mouth every 6 (six) hours as needed for moderate pain. -     MR Cervical Spine Wo Contrast; Future   I have discontinued Mr. Milhouse hydrOXYzine, meloxicam, Flavocoxid-Cit Zn Bisglcinate, and levothyroxine. I have also changed his HYDROcodone-ibuprofen. Additionally, I am having him start on levothyroxine. Lastly, I am  having him maintain his ALIGN, aspirin, esomeprazole, mometasone-formoterol, GNP MEGA MULTI FOR MEN, potassium chloride SA, doxepin, losartan, hydrochlorothiazide, amLODipine, mometasone, atorvastatin, and baclofen.  Meds ordered this encounter  Medications  . DISCONTD: baclofen (LIORESAL) 10 MG tablet    Sig: TK 1 T PO BID    Refill:  0  . DISCONTD: HYDROcodone-ibuprofen (VICOPROFEN) 7.5-200 MG tablet    Sig: TK 1 T PO Q 8 H    Refill:  0  . baclofen (LIORESAL) 10 MG tablet    Sig: TK 1 T PO BID    Dispense:  60 tablet    Refill:  2  . HYDROcodone-ibuprofen (VICOPROFEN) 7.5-200 MG tablet    Sig: Take 1 tablet by mouth every 6 (six) hours as needed for moderate pain.    Dispense:  75 tablet    Refill:  0  . levothyroxine (SYNTHROID) 175 MCG tablet    Sig: Take 1 tablet (175 mcg total) by mouth daily before breakfast.    Dispense:  90 tablet    Refill:  1     Follow-up: Return in about 4 weeks (around 09/22/2015).  Scarlette Calico, MD

## 2015-08-26 NOTE — Addendum Note (Signed)
Addended by: Lowella Dandy on: 08/26/2015 01:47 PM   Modules accepted: Orders

## 2015-08-31 ENCOUNTER — Other Ambulatory Visit: Payer: Self-pay | Admitting: Internal Medicine

## 2015-09-04 ENCOUNTER — Encounter: Payer: Self-pay | Admitting: Internal Medicine

## 2015-09-09 ENCOUNTER — Ambulatory Visit
Admission: RE | Admit: 2015-09-09 | Discharge: 2015-09-09 | Disposition: A | Discharge status: Home / Self Care | Payer: MEDICARE | Source: Ambulatory Visit | Attending: Internal Medicine | Admitting: Internal Medicine

## 2015-09-09 DIAGNOSIS — M5412 Radiculopathy, cervical region: Secondary | ICD-10-CM

## 2015-09-09 DIAGNOSIS — M50223 Other cervical disc displacement at C6-C7 level: Secondary | ICD-10-CM | POA: Diagnosis not present

## 2015-09-10 ENCOUNTER — Encounter: Payer: Self-pay | Admitting: Internal Medicine

## 2015-09-10 ENCOUNTER — Other Ambulatory Visit: Payer: Self-pay | Admitting: Internal Medicine

## 2015-09-10 DIAGNOSIS — M4802 Spinal stenosis, cervical region: Secondary | ICD-10-CM

## 2015-10-08 DIAGNOSIS — Z79899 Other long term (current) drug therapy: Secondary | ICD-10-CM | POA: Diagnosis not present

## 2015-10-08 DIAGNOSIS — I10 Essential (primary) hypertension: Secondary | ICD-10-CM | POA: Diagnosis not present

## 2015-10-08 DIAGNOSIS — D72829 Elevated white blood cell count, unspecified: Secondary | ICD-10-CM | POA: Diagnosis not present

## 2015-10-08 DIAGNOSIS — N390 Urinary tract infection, site not specified: Secondary | ICD-10-CM | POA: Diagnosis not present

## 2015-10-08 DIAGNOSIS — H04123 Dry eye syndrome of bilateral lacrimal glands: Secondary | ICD-10-CM | POA: Diagnosis not present

## 2015-10-08 DIAGNOSIS — K219 Gastro-esophageal reflux disease without esophagitis: Secondary | ICD-10-CM | POA: Diagnosis not present

## 2015-10-08 DIAGNOSIS — N410 Acute prostatitis: Secondary | ICD-10-CM | POA: Diagnosis not present

## 2015-10-08 DIAGNOSIS — E785 Hyperlipidemia, unspecified: Secondary | ICD-10-CM | POA: Diagnosis not present

## 2015-10-08 DIAGNOSIS — N179 Acute kidney failure, unspecified: Secondary | ICD-10-CM | POA: Diagnosis not present

## 2015-10-08 DIAGNOSIS — R079 Chest pain, unspecified: Secondary | ICD-10-CM | POA: Diagnosis not present

## 2015-10-08 DIAGNOSIS — Z96652 Presence of left artificial knee joint: Secondary | ICD-10-CM | POA: Diagnosis not present

## 2015-10-08 DIAGNOSIS — B9620 Unspecified Escherichia coli [E. coli] as the cause of diseases classified elsewhere: Secondary | ICD-10-CM | POA: Diagnosis not present

## 2015-10-09 DIAGNOSIS — D72829 Elevated white blood cell count, unspecified: Secondary | ICD-10-CM | POA: Diagnosis not present

## 2015-10-09 DIAGNOSIS — I10 Essential (primary) hypertension: Secondary | ICD-10-CM | POA: Diagnosis not present

## 2015-10-09 DIAGNOSIS — N179 Acute kidney failure, unspecified: Secondary | ICD-10-CM | POA: Diagnosis not present

## 2015-10-09 DIAGNOSIS — N390 Urinary tract infection, site not specified: Secondary | ICD-10-CM | POA: Diagnosis not present

## 2015-10-13 ENCOUNTER — Encounter: Payer: Self-pay | Admitting: Internal Medicine

## 2015-10-13 ENCOUNTER — Other Ambulatory Visit (INDEPENDENT_AMBULATORY_CARE_PROVIDER_SITE_OTHER): Payer: MEDICARE

## 2015-10-13 ENCOUNTER — Ambulatory Visit (INDEPENDENT_AMBULATORY_CARE_PROVIDER_SITE_OTHER): Payer: MEDICARE | Admitting: Internal Medicine

## 2015-10-13 ENCOUNTER — Other Ambulatory Visit: Payer: Self-pay | Admitting: Internal Medicine

## 2015-10-13 VITALS — BP 120/60 | HR 77 | Temp 98.0°F | Wt 209.0 lb

## 2015-10-13 DIAGNOSIS — R7989 Other specified abnormal findings of blood chemistry: Secondary | ICD-10-CM

## 2015-10-13 DIAGNOSIS — R748 Abnormal levels of other serum enzymes: Secondary | ICD-10-CM

## 2015-10-13 DIAGNOSIS — N410 Acute prostatitis: Secondary | ICD-10-CM | POA: Diagnosis not present

## 2015-10-13 DIAGNOSIS — N179 Acute kidney failure, unspecified: Secondary | ICD-10-CM | POA: Diagnosis not present

## 2015-10-13 DIAGNOSIS — D72829 Elevated white blood cell count, unspecified: Secondary | ICD-10-CM

## 2015-10-13 DIAGNOSIS — E860 Dehydration: Secondary | ICD-10-CM

## 2015-10-13 DIAGNOSIS — D72828 Other elevated white blood cell count: Secondary | ICD-10-CM

## 2015-10-13 DIAGNOSIS — R972 Elevated prostate specific antigen [PSA]: Secondary | ICD-10-CM | POA: Insufficient documentation

## 2015-10-13 LAB — CBC WITH DIFFERENTIAL/PLATELET
BASOS PCT: 0.5 % (ref 0.0–3.0)
Basophils Absolute: 0.1 10*3/uL (ref 0.0–0.1)
Eosinophils Absolute: 0.4 10*3/uL (ref 0.0–0.7)
Eosinophils Relative: 3.5 % (ref 0.0–5.0)
HEMATOCRIT: 39.2 % (ref 39.0–52.0)
Hemoglobin: 12.8 g/dL — ABNORMAL LOW (ref 13.0–17.0)
LYMPHS ABS: 1.3 10*3/uL (ref 0.7–4.0)
LYMPHS PCT: 12.6 % (ref 12.0–46.0)
MCHC: 32.7 g/dL (ref 30.0–36.0)
MCV: 89.5 fl (ref 78.0–100.0)
Monocytes Absolute: 2 10*3/uL — ABNORMAL HIGH (ref 0.1–1.0)
NEUTROS ABS: 6.8 10*3/uL (ref 1.4–7.7)
NEUTROS PCT: 64.5 % (ref 43.0–77.0)
PLATELETS: 333 10*3/uL (ref 150.0–400.0)
RBC: 4.38 Mil/uL (ref 4.22–5.81)
RDW: 14.6 % (ref 11.5–15.5)
WBC: 10.5 10*3/uL (ref 4.0–10.5)

## 2015-10-13 LAB — BASIC METABOLIC PANEL
BUN: 21 mg/dL (ref 6–23)
CALCIUM: 9.6 mg/dL (ref 8.4–10.5)
CHLORIDE: 103 meq/L (ref 96–112)
CO2: 27 meq/L (ref 19–32)
Creatinine, Ser: 1.77 mg/dL — ABNORMAL HIGH (ref 0.40–1.50)
GFR: 49.18 mL/min — ABNORMAL LOW (ref >60.00)
GLUCOSE: 96 mg/dL (ref 70–99)
Potassium: 3.8 mEq/L (ref 3.5–5.1)
SODIUM: 139 meq/L (ref 135–145)

## 2015-10-13 NOTE — Progress Notes (Signed)
Pre visit review using our clinic review tool, if applicable. No additional management support is needed unless otherwise documented below in the visit note. 

## 2015-10-13 NOTE — Patient Instructions (Signed)
Drink as much nondairy fluids as possible. Avoid spicy foods or alcohol as  these may aggravate the bladder / prostate. Do not take decongestants. Avoid narcotics if possible. 

## 2015-10-13 NOTE — Progress Notes (Signed)
   Subjective:    Patient ID: Ronald Bond, male    DOB: [birth year], 69 y.o.   MRN: [id number]  HPI He was hospitalized at [facility] in [place] 11/10-11/11/16 with acute prostatitis complicated by acute kidney injury and dehydration. Apparently he had elevated white count as CBC and differential was recommended upon follow-up.  He presented with fever, oliguria, dark urine, and malodorous urine. He was having pain attempting to urinate.  He he was discharged on Levaquin and has 5 days left.  At this time he has some night sweats and has nocturia 8 times a night. Otherwise he has no active GI or genitourinary symptoms.  Last PSA on record was 1.66 on 04/02/13.  Review of Systems Dysuria, pyuria, hematuria, frequency, or polyuria are denied at this time.    Objective:   Physical Exam Pertinent or positive findings include: He has a mustache. There is marked crepitus of the knees, especially on the left. Deep tendon reflexes are 0+ at the knees.  General appearance :adequately nourished; in no distress.  Eyes: No conjunctival inflammation or scleral icterus is present.  Oral exam:  Lips and gums are healthy appearing.There is no oropharyngeal erythema or exudate noted. Dental hygiene is good.  Heart:  Normal rate and regular rhythm. S1 and S2 normal without gallop, murmur, click, rub or other extra sounds    Lungs:Chest clear to auscultation; no wheezes, rhonchi,rales ,or rubs present.No increased work of breathing.   Abdomen: bowel sounds normal, soft and non-tender without masses, organomegaly or hernias noted.  No guarding or rebound. No flank tenderness to percussion.  Vascular : all pulses equal ; no bruits present.  Skin:Warm & dry.  Intact without suspicious lesions or rashes ; no tenting .   Lymphatic: No lymphadenopathy is noted about the head, neck, axilla.   Neuro: Strength, tone  normal.      Assessment & Plan:  #1 acute prostatitis.  #2  acute renal injury.  #3 dehydration.  #4 Leukocytosis.  See orders recommendations

## 2015-11-03 ENCOUNTER — Other Ambulatory Visit (INDEPENDENT_AMBULATORY_CARE_PROVIDER_SITE_OTHER): Payer: MEDICARE

## 2015-11-03 ENCOUNTER — Ambulatory Visit (INDEPENDENT_AMBULATORY_CARE_PROVIDER_SITE_OTHER): Payer: MEDICARE | Admitting: Internal Medicine

## 2015-11-03 ENCOUNTER — Encounter: Payer: Self-pay | Admitting: Internal Medicine

## 2015-11-03 VITALS — BP 138/74 | HR 56 | Temp 98.4°F | Resp 16 | Ht 70.0 in | Wt 214.0 lb

## 2015-11-03 DIAGNOSIS — D72828 Other elevated white blood cell count: Secondary | ICD-10-CM

## 2015-11-03 DIAGNOSIS — R351 Nocturia: Secondary | ICD-10-CM

## 2015-11-03 DIAGNOSIS — I10 Essential (primary) hypertension: Secondary | ICD-10-CM

## 2015-11-03 DIAGNOSIS — R748 Abnormal levels of other serum enzymes: Secondary | ICD-10-CM | POA: Diagnosis not present

## 2015-11-03 DIAGNOSIS — N401 Enlarged prostate with lower urinary tract symptoms: Secondary | ICD-10-CM | POA: Diagnosis not present

## 2015-11-03 DIAGNOSIS — R7989 Other specified abnormal findings of blood chemistry: Secondary | ICD-10-CM

## 2015-11-03 DIAGNOSIS — N410 Acute prostatitis: Secondary | ICD-10-CM | POA: Diagnosis not present

## 2015-11-03 DIAGNOSIS — E038 Other specified hypothyroidism: Secondary | ICD-10-CM

## 2015-11-03 LAB — CBC WITH DIFFERENTIAL/PLATELET
BASOS PCT: 0.8 % (ref 0.0–3.0)
Basophils Absolute: 0.1 10*3/uL (ref 0.0–0.1)
EOS ABS: 0.4 10*3/uL (ref 0.0–0.7)
Eosinophils Relative: 5.4 % — ABNORMAL HIGH (ref 0.0–5.0)
HEMATOCRIT: 37.8 % — AB (ref 39.0–52.0)
Hemoglobin: 12.5 g/dL — ABNORMAL LOW (ref 13.0–17.0)
LYMPHS ABS: 1.7 10*3/uL (ref 0.7–4.0)
LYMPHS PCT: 23.7 % (ref 12.0–46.0)
MCHC: 33.2 g/dL (ref 30.0–36.0)
MCV: 88.5 fl (ref 78.0–100.0)
MONOS PCT: 11.6 % (ref 3.0–12.0)
Monocytes Absolute: 0.8 10*3/uL (ref 0.1–1.0)
NEUTROS ABS: 4.3 10*3/uL (ref 1.4–7.7)
NEUTROS PCT: 58.5 % (ref 43.0–77.0)
PLATELETS: 286 10*3/uL (ref 150.0–400.0)
RBC: 4.26 Mil/uL (ref 4.22–5.81)
RDW: 15.4 % (ref 11.5–15.5)
WBC: 7.3 10*3/uL (ref 4.0–10.5)

## 2015-11-03 LAB — BASIC METABOLIC PANEL
BUN: 12 mg/dL (ref 6–23)
CALCIUM: 9.4 mg/dL (ref 8.4–10.5)
CHLORIDE: 104 meq/L (ref 96–112)
CO2: 29 meq/L (ref 19–32)
CREATININE: 1.16 mg/dL (ref 0.40–1.50)
GFR: 80.07 mL/min (ref >60.00)
GLUCOSE: 94 mg/dL (ref 70–99)
POTASSIUM: 4 meq/L (ref 3.5–5.1)
Sodium: 141 mEq/L (ref 135–145)

## 2015-11-03 LAB — URINALYSIS, ROUTINE W REFLEX MICROSCOPIC
Bilirubin Urine: NEGATIVE
HGB URINE DIPSTICK: NEGATIVE
Ketones, ur: NEGATIVE
NITRITE: NEGATIVE
SPECIFIC GRAVITY, URINE: 1.015 (ref 1.000–1.030)
Urine Glucose: NEGATIVE
Urobilinogen, UA: 0.2 (ref 0.0–1.0)
pH: 6.5 (ref 5.0–8.0)

## 2015-11-03 LAB — TSH: TSH: 0.19 u[IU]/mL — ABNORMAL LOW (ref 0.35–4.50)

## 2015-11-03 LAB — PSA: PSA: 12.45 ng/mL — ABNORMAL HIGH (ref 0.10–4.00)

## 2015-11-03 LAB — FECAL OCCULT BLOOD, GUAIAC: FECAL OCCULT BLD: NEGATIVE

## 2015-11-03 MED ORDER — CIPROFLOXACIN HCL 500 MG PO TABS: 500.0000 mg | ORAL_TABLET | Freq: Two times a day (BID) | ORAL | Status: DC

## 2015-11-03 MED ORDER — TAMSULOSIN HCL 0.4 MG PO CAPS: 0.4000 mg | ORAL_CAPSULE | Freq: Every day | ORAL | Status: DC

## 2015-11-03 NOTE — Progress Notes (Signed)
Subjective:  Patient ID: Ronald Bond, male    DOB: [birth year]  Age: 69 y.o. MRN: [id number]  CC: Urinary Tract Infection   HPI Ronald Bond presents for f/up on recent prostate infection - he was admitted in [place] for IV antibiotics for 2 days then he only took about 10 days of levaquin. He complains that the symptoms are returning with frequency, dysuria, nocturia, and urinary hesitancy.  Outpatient Prescriptions Prior to Visit  Medication Sig Dispense Refill  . amLODipine (NORVASC) 10 MG tablet TAKE 1 TABLET BY MOUTH DAILY 90 tablet 3  . aspirin 81 MG EC tablet Take 81 mg by mouth daily.      Marland Kitchen atorvastatin (LIPITOR) 40 MG tablet TAKE 1 TABLET BY MOUTH DAILY. 90 tablet 3  . baclofen (LIORESAL) 10 MG tablet TK 1 T PO BID 60 tablet 2  . doxepin (SINEQUAN) 25 MG capsule Take 1 capsule (25 mg total) by mouth at bedtime as needed. 30 capsule 11  . losartan (COZAAR) 100 MG tablet TAKE 1 TABLET BY MOUTH DAILY 90 tablet 3  . mometasone (ELOCON) 0.1 % ointment Apply topically daily. 45 g 3  . mometasone-formoterol (DULERA) 100-5 MCG/ACT AERO Inhale 2 puffs into the lungs 2 (two) times daily.    . Multiple Vitamins-Minerals (GNP MEGA MULTI FOR MEN) TABS Take 1 tablet by mouth daily.    . Probiotic Product (ALIGN) 4 MG CAPS Take 1 capsule by mouth daily.     Marland Kitchen HYDROcodone-ibuprofen (VICOPROFEN) 7.5-200 MG tablet Take 1 tablet by mouth every 6 (six) hours as needed for moderate pain. 75 tablet 0  . levofloxacin (LEVAQUIN) 500 MG tablet Take 500 mg by mouth daily.    Marland Kitchen levothyroxine (SYNTHROID) 175 MCG tablet Take 1 tablet (175 mcg total) by mouth daily before breakfast. 90 tablet 1  . esomeprazole (NEXIUM) 40 MG capsule Take 1 capsule (40 mg total) by mouth daily. (Patient not taking: Reported on 11/03/2015) 180 capsule 3  . hydrochlorothiazide (HYDRODIURIL) 25 MG tablet TAKE 1 TABLET BY MOUTH DAILY 90 tablet 3  . potassium chloride SA (K-DUR,KLOR-CON) 20 MEQ tablet TAKE 1 TABLET BY  MOUTH TWICE DAILY (Patient not taking: Reported on 11/03/2015) 180 tablet 3   No facility-administered medications prior to visit.    ROS Review of Systems  Constitutional: Negative.  Negative for fever, chills, diaphoresis, appetite change and fatigue.  HENT: Negative.   Eyes: Negative.   Respiratory: Negative.  Negative for cough, choking and shortness of breath.   Cardiovascular: Negative.  Negative for chest pain, palpitations and leg swelling.  Gastrointestinal: Negative.  Negative for nausea, vomiting, abdominal pain, diarrhea, constipation and blood in stool.  Endocrine: Negative.   Genitourinary: Positive for dysuria, frequency and difficulty urinating. Negative for urgency, hematuria, flank pain, decreased urine volume, discharge, penile swelling, scrotal swelling, enuresis, genital sores, penile pain and testicular pain.  Musculoskeletal: Negative.   Skin: Negative.   Allergic/Immunologic: Negative.   Hematological: Negative.  Negative for adenopathy. Does not bruise/bleed easily.  Psychiatric/Behavioral: Negative.     Objective:  BP 138/74 mmHg  Pulse 56  Temp(Src) 98.4 F (36.9 C) (Oral)  Ht 5\' 10"  (1.778 m)  Wt 214 lb (97.07 kg)  BMI 30.71 kg/m2  SpO2 99%  BP Readings from Last 3 Encounters:  11/03/15 138/74  10/13/15 120/60  08/25/15 130/80    Wt Readings from Last 3 Encounters:  11/03/15 214 lb (97.07 kg)  10/13/15 209 lb (94.802 kg)  09/09/15 219 lb (99.338 kg)  Physical Exam  Constitutional: He is oriented to person, place, and time. No distress.  HENT:  Mouth/Throat: Oropharynx is clear and moist. No oropharyngeal exudate.  Eyes: Conjunctivae are normal. Right eye exhibits no discharge. Left eye exhibits no discharge. No scleral icterus.  Neck: Normal range of motion. Neck supple. No JVD present. No tracheal deviation present. No thyromegaly present.  Cardiovascular: Normal rate, regular rhythm, normal heart sounds and intact distal pulses.  Exam  reveals no gallop and no friction rub.   No murmur heard. Pulmonary/Chest: Effort normal and breath sounds normal. No stridor. No respiratory distress. He has no wheezes. He has no rales. He exhibits no tenderness.  Abdominal: Soft. Bowel sounds are normal. He exhibits no distension and no mass. There is no tenderness. There is no rebound and no guarding. Hernia confirmed negative in the right inguinal area and confirmed negative in the left inguinal area.  Genitourinary: Rectum normal, testes normal and penis normal. Rectal exam shows no external hemorrhoid, no internal hemorrhoid, no fissure, no mass, no tenderness and anal tone normal. Guaiac negative stool. Prostate is enlarged (1+ smooth symm BPH with bogginess) and tender. Right testis shows no mass, no swelling and no tenderness. Right testis is descended. Left testis shows no mass, no swelling and no tenderness. Left testis is descended. Uncircumcised. No phimosis, paraphimosis, hypospadias, penile erythema or penile tenderness. No discharge found.  Musculoskeletal: Normal range of motion. He exhibits no edema or tenderness.  Lymphadenopathy:    He has no cervical adenopathy.       Right: No inguinal adenopathy present.       Left: No inguinal adenopathy present.  Neurological: He is oriented to person, place, and time.  Skin: Skin is warm and dry. No rash noted. He is not diaphoretic. No erythema. No pallor.    Lab Results  Component Value Date   WBC 7.3 11/03/2015   HGB 12.5* 11/03/2015   HCT 37.8* 11/03/2015   PLT 286.0 11/03/2015   GLUCOSE 94 11/03/2015   CHOL 284* 04/17/2015   TRIG 166* 04/17/2015   HDL 58 04/17/2015   LDLDIRECT 180.8 04/02/2013   LDLCALC 193* 04/17/2015   ALT 24 10/21/2013   AST 23 10/21/2013   NA 141 11/03/2015   K 4.0 11/03/2015   CL 104 11/03/2015   CREATININE 1.16 11/03/2015   BUN 12 11/03/2015   CO2 29 11/03/2015   TSH 0.19* 11/03/2015   PSA 12.45* 11/03/2015   INR 0.9 10/17/2007   HGBA1C 6.1  04/02/2013    Mr Cervical Spine Wo Contrast  09/09/2015  CLINICAL DATA:  Right-sided neck and right shoulder pain for 6 weeks. EXAM: MRI CERVICAL SPINE WITHOUT CONTRAST TECHNIQUE: Multiplanar, multisequence MR imaging of the cervical spine was performed. No intravenous contrast was administered. COMPARISON:  None. FINDINGS: There straightening of the normal cervical lordosis. There is trace anterolisthesis of C3 on C4 and C4 on C5. Advanced disc space narrowing and degenerative endplate changes are present at C5-6 and C6-7, including mild degenerative edema at C6-7. Mild edema is associated with left C2-3 and right C3-4 facet arthritis. The cervical spinal cord is normal in caliber and signal. Paraspinal soft tissues are unremarkable. C2-3: Minimal disc bulging, mild left uncovertebral spurring, and moderate to severe left facet arthrosis result in mild left neural foraminal stenosis without spinal stenosis. C3-4: Broad central disc protrusion, uncovertebral spurring, and moderate to severe right and moderate left facet arthrosis result in mild bilateral neural foraminal stenosis without spinal stenosis. C4-5: Mild disc  bulging, uncovertebral spurring, and severe right and mild left facet arthrosis result in moderate right neural foraminal stenosis without spinal stenosis. C5-6: Broad-based posterior disc osteophyte complex results in mild bilateral neural foraminal stenosis without spinal stenosis. C6-7: Broad-based posterior disc osteophyte complex results in moderate bilateral neural foraminal stenosis without spinal stenosis. C7-T1: Moderate right and mild left facet arthrosis without significant stenosis. IMPRESSION: 1. Advanced lower cervical disc degeneration, worst at C6-7 where there is moderate bilateral neural foraminal stenosis. No spinal stenosis. 2. Moderate to severe multilevel facet arthrosis. Electronically Signed   By: Logan Bores M.D.   On: 09/09/2015 17:12    Assessment & Plan:   Zayvier  was seen today for urinary tract infection.  Diagnoses and all orders for this visit:  Essential hypertension, benign- His BP is well controlled, lytes and renal function are stable -     Basic metabolic panel; Future  Other specified hypothyroidism- His Tsh is suppressed so I will lower his synthroid dose -     CBC with Differential/Platelet; Future -     TSH; Future -     levothyroxine (SYNTHROID, LEVOTHROID) 150 MCG tablet; Take 1 tablet (150 mcg total) by mouth daily.  BPH associated with nocturia- will treat his symptoms with flomax -     PSA; Future -     Urinalysis, Routine w reflex microscopic (not at [facility]); Future -     tamsulosin (FLOMAX) 0.4 MG CAPS capsule; Take 1 capsule (0.4 mg total) by mouth daily after supper.  Acute prostatitis- his PSA os elevated and UA is positive for WBC's, will recheck this after the infection has resolved in 3-4 months, I think his infection needs to be treated for at least 30 days, will start Cipro -     PSA; Future -     Urinalysis, Routine w reflex microscopic (not at [facility]); Future -     ciprofloxacin (CIPRO) 500 MG tablet; Take 1 tablet (500 mg total) by mouth 2 (two) times daily. -     tamsulosin (FLOMAX) 0.4 MG CAPS capsule; Take 1 capsule (0.4 mg total) by mouth daily after supper.  I have discontinued Mr. Obannon esomeprazole, hydrochlorothiazide, HYDROcodone-ibuprofen, levothyroxine, and potassium chloride SA. I have also changed his levofloxacin to ciprofloxacin. Additionally, I am having him start on tamsulosin and levothyroxine. Lastly, I am having him maintain his ALIGN, aspirin, mometasone-formoterol, GNP MEGA MULTI FOR MEN, doxepin, losartan, amLODipine, mometasone, atorvastatin, and baclofen.  Meds ordered this encounter  Medications  . ciprofloxacin (CIPRO) 500 MG tablet    Sig: Take 1 tablet (500 mg total) by mouth 2 (two) times daily.    Dispense:  60 tablet    Refill:  0  . tamsulosin (FLOMAX) 0.4 MG CAPS capsule    Sig:  Take 1 capsule (0.4 mg total) by mouth daily after supper.    Dispense:  90 capsule    Refill:  1  . levothyroxine (SYNTHROID, LEVOTHROID) 150 MCG tablet    Sig: Take 1 tablet (150 mcg total) by mouth daily.    Dispense:  90 tablet    Refill:  1     Follow-up: Return in about 2 months (around 01/04/2016).  Scarlette Calico, MD

## 2015-11-03 NOTE — Progress Notes (Signed)
Pre visit review using our clinic review tool, if applicable. No additional management support is needed unless otherwise documented below in the visit note. 

## 2015-11-03 NOTE — Patient Instructions (Signed)

## 2015-11-04 ENCOUNTER — Encounter: Payer: Self-pay | Admitting: Internal Medicine

## 2015-11-04 MED ORDER — LEVOTHYROXINE SODIUM 150 MCG PO TABS: 150.0000 ug | ORAL_TABLET | Freq: Every day | ORAL | Status: DC

## 2015-12-08 MED FILL — LEVOTHYROXINE 150 MCG TAB: 150 | 30 days supply | Qty: 30 | Fill #1

## 2015-12-28 MED FILL — LOSARTAN POTASSIUM 100 MG T: 100 | 30 days supply | Qty: 30 | Fill #2 | Status: TO

## 2015-12-28 MED FILL — AMLODIPINE BESYLATE 10 MG T: 10 | 30 days supply | Qty: 30 | Fill #5

## 2016-01-08 MED FILL — LEVOTHYROXINE 150 MCG TAB: 150 | 30 days supply | Qty: 30 | Fill #2

## 2016-01-28 ENCOUNTER — Ambulatory Visit: Payer: MEDICARE | Admitting: Internal Medicine

## 2016-02-09 ENCOUNTER — Other Ambulatory Visit: Payer: Self-pay | Admitting: Internal Medicine

## 2016-02-09 MED FILL — LEVOTHYROXINE 150 MCG TAB: 150 | 30 days supply | Qty: 30 | Fill #3

## 2016-02-09 MED FILL — AMLODIPINE BESYLATE 10 MG T: 10 | 30 days supply | Qty: 30 | Fill #0

## 2016-02-11 ENCOUNTER — Other Ambulatory Visit: Payer: Self-pay | Admitting: Internal Medicine

## 2016-02-11 MED FILL — LOSARTAN POTASSIUM 100 MG T: 100 | 30 days supply | Qty: 30 | Fill #0 | Status: TO

## 2016-02-15 DIAGNOSIS — J45909 Unspecified asthma, uncomplicated: Secondary | ICD-10-CM | POA: Diagnosis not present

## 2016-02-15 DIAGNOSIS — J209 Acute bronchitis, unspecified: Secondary | ICD-10-CM | POA: Diagnosis not present

## 2016-02-15 DIAGNOSIS — H1033 Unspecified acute conjunctivitis, bilateral: Secondary | ICD-10-CM | POA: Diagnosis not present

## 2016-03-08 ENCOUNTER — Telehealth: Payer: Self-pay

## 2016-03-08 ENCOUNTER — Other Ambulatory Visit (INDEPENDENT_AMBULATORY_CARE_PROVIDER_SITE_OTHER): Payer: MEDICARE

## 2016-03-08 ENCOUNTER — Encounter: Payer: Self-pay | Admitting: Internal Medicine

## 2016-03-08 ENCOUNTER — Ambulatory Visit (INDEPENDENT_AMBULATORY_CARE_PROVIDER_SITE_OTHER): Payer: MEDICARE | Admitting: Internal Medicine

## 2016-03-08 VITALS — BP 140/70 | HR 72 | Temp 98.4°F | Resp 16 | Ht 70.0 in | Wt 215.0 lb

## 2016-03-08 DIAGNOSIS — N401 Enlarged prostate with lower urinary tract symptoms: Secondary | ICD-10-CM | POA: Diagnosis not present

## 2016-03-08 DIAGNOSIS — E785 Hyperlipidemia, unspecified: Secondary | ICD-10-CM | POA: Diagnosis not present

## 2016-03-08 DIAGNOSIS — I10 Essential (primary) hypertension: Secondary | ICD-10-CM

## 2016-03-08 DIAGNOSIS — H10413 Chronic giant papillary conjunctivitis, bilateral: Secondary | ICD-10-CM | POA: Diagnosis not present

## 2016-03-08 DIAGNOSIS — Z961 Presence of intraocular lens: Secondary | ICD-10-CM | POA: Diagnosis not present

## 2016-03-08 DIAGNOSIS — J4520 Mild intermittent asthma, uncomplicated: Secondary | ICD-10-CM

## 2016-03-08 DIAGNOSIS — R972 Elevated prostate specific antigen [PSA]: Secondary | ICD-10-CM

## 2016-03-08 DIAGNOSIS — D539 Nutritional anemia, unspecified: Secondary | ICD-10-CM

## 2016-03-08 DIAGNOSIS — R351 Nocturia: Secondary | ICD-10-CM

## 2016-03-08 DIAGNOSIS — Z23 Encounter for immunization: Secondary | ICD-10-CM

## 2016-03-08 DIAGNOSIS — E038 Other specified hypothyroidism: Secondary | ICD-10-CM

## 2016-03-08 DIAGNOSIS — D509 Iron deficiency anemia, unspecified: Secondary | ICD-10-CM | POA: Insufficient documentation

## 2016-03-08 DIAGNOSIS — M5412 Radiculopathy, cervical region: Secondary | ICD-10-CM

## 2016-03-08 DIAGNOSIS — M4802 Spinal stenosis, cervical region: Secondary | ICD-10-CM

## 2016-03-08 LAB — BASIC METABOLIC PANEL
BUN: 13 mg/dL (ref 6–23)
CHLORIDE: 104 meq/L (ref 96–112)
CO2: 28 mEq/L (ref 19–32)
Calcium: 9.4 mg/dL (ref 8.4–10.5)
Creatinine, Ser: 1.44 mg/dL (ref 0.40–1.50)
GFR: 62.32 mL/min (ref >60.00)
Glucose, Bld: 90 mg/dL (ref 70–99)
POTASSIUM: 3.7 meq/L (ref 3.5–5.1)
Sodium: 141 mEq/L (ref 135–145)

## 2016-03-08 LAB — URINALYSIS, ROUTINE W REFLEX MICROSCOPIC
Bilirubin Urine: NEGATIVE
KETONES UR: NEGATIVE
LEUKOCYTES UA: NEGATIVE
NITRITE: NEGATIVE
Specific Gravity, Urine: 1.01 (ref 1.000–1.030)
Total Protein, Urine: 30 — AB
URINE GLUCOSE: NEGATIVE
Urobilinogen, UA: 0.2 (ref 0.0–1.0)
pH: 7 (ref 5.0–8.0)

## 2016-03-08 LAB — LIPID PANEL
CHOL/HDL RATIO: 3
Cholesterol: 181 mg/dL (ref 0–200)
HDL: 63.1 mg/dL (ref >39.00)
NONHDL: 118.27
TRIGLYCERIDES: 221 mg/dL — AB (ref 0.0–149.0)
VLDL: 44.2 mg/dL — AB (ref 0.0–40.0)

## 2016-03-08 LAB — CBC WITH DIFFERENTIAL/PLATELET
BASOS ABS: 0.1 10*3/uL (ref 0.0–0.1)
Basophils Relative: 0.6 % (ref 0.0–3.0)
EOS ABS: 1 10*3/uL — AB (ref 0.0–0.7)
Eosinophils Relative: 9.1 % — ABNORMAL HIGH (ref 0.0–5.0)
HEMATOCRIT: 40.6 % (ref 39.0–52.0)
Hemoglobin: 13.5 g/dL (ref 13.0–17.0)
LYMPHS PCT: 17.5 % (ref 12.0–46.0)
Lymphs Abs: 2 10*3/uL (ref 0.7–4.0)
MCHC: 33.4 g/dL (ref 30.0–36.0)
MCV: 88.1 fl (ref 78.0–100.0)
Monocytes Absolute: 1 10*3/uL (ref 0.1–1.0)
Monocytes Relative: 8.7 % (ref 3.0–12.0)
NEUTROS ABS: 7.2 10*3/uL (ref 1.4–7.7)
Neutrophils Relative %: 64.1 % (ref 43.0–77.0)
PLATELETS: 244 10*3/uL (ref 150.0–400.0)
RBC: 4.61 Mil/uL (ref 4.22–5.81)
RDW: 15.8 % — ABNORMAL HIGH (ref 11.5–15.5)
WBC: 11.3 10*3/uL — AB (ref 4.0–10.5)

## 2016-03-08 LAB — IBC PANEL
Iron: 59 ug/dL (ref 42–165)
Saturation Ratios: 15.3 % — ABNORMAL LOW (ref 20.0–50.0)
Transferrin: 276 mg/dL (ref 212.0–360.0)

## 2016-03-08 LAB — TSH: TSH: 1.02 u[IU]/mL (ref 0.35–4.50)

## 2016-03-08 LAB — VITAMIN B12: Vitamin B-12: >1500 pg/mL — ABNORMAL HIGH (ref 211–911)

## 2016-03-08 LAB — FOLATE: Folate: >23.7 ng/mL (ref >5.9)

## 2016-03-08 LAB — PSA: PSA: 4.4 ng/mL — ABNORMAL HIGH (ref 0.10–4.00)

## 2016-03-08 LAB — LDL CHOLESTEROL, DIRECT: LDL DIRECT: 75 mg/dL

## 2016-03-08 LAB — FERRITIN: Ferritin: 52.6 ng/mL (ref 22.0–322.0)

## 2016-03-08 MED ORDER — HYDROCODONE-IBUPROFEN 7.5-200 MG PO TABS: 1.0000 | ORAL_TABLET | Freq: Three times a day (TID) | ORAL | Status: DC | PRN

## 2016-03-08 MED ORDER — BACLOFEN 10 MG PO TABS: ORAL_TABLET | ORAL | Status: DC

## 2016-03-08 MED ORDER — MOMETASONE FURO-FORMOTEROL FUM 100-5 MCG/ACT IN AERO: 2.0000 | INHALATION_SPRAY | Freq: Two times a day (BID) | RESPIRATORY_TRACT | Status: DC

## 2016-03-08 MED FILL — FLUOROMETHOLONE 0.1% DROPS: 0.1 | 17 days supply | Qty: 5 | Fill #0

## 2016-03-08 MED FILL — BACLOFEN 10 MG TABLET: 10 | 30 days supply | Qty: 60 | Fill #0

## 2016-03-08 NOTE — Telephone Encounter (Signed)
PA initiated via [place]

## 2016-03-08 NOTE — Progress Notes (Signed)
Subjective:  Patient ID: Ronald Bond, male    DOB: 12-May-1946  Age: 70 y.o. MRN: [id number]  CC: Anemia; Hypothyroidism; Hyperlipidemia; and Hypertension   HPI Ronald Bond presents for follow up - he was treated for acute prostatitis about 3 or 4 months ago and was noted to have an elevated PSA. All of his urinary symptoms have resolved, he returns for repeat testing on the PSA. He continues to have intermittent episodes of right-sided neck pain and request a refill on Vicoprofen and baclofen. Overall the neck pain is improving and he denies numbness, weakness, tingling in his arms or legs.  Outpatient Prescriptions Prior to Visit  Medication Sig Dispense Refill  . amLODipine (NORVASC) 10 MG tablet TAKE 1 TABLET BY MOUTH DAILY 90 tablet 3  . aspirin 81 MG EC tablet Take 81 mg by mouth daily.      Marland Kitchen atorvastatin (LIPITOR) 40 MG tablet TAKE 1 TABLET BY MOUTH DAILY. 90 tablet 3  . doxepin (SINEQUAN) 25 MG capsule Take 1 capsule (25 mg total) by mouth at bedtime as needed. 30 capsule 11  . levothyroxine (SYNTHROID, LEVOTHROID) 150 MCG tablet Take 1 tablet (150 mcg total) by mouth daily. 90 tablet 1  . losartan (COZAAR) 100 MG tablet TAKE 1 TABLET BY MOUTH DAILY 145 tablet 1  . mometasone (ELOCON) 0.1 % ointment Apply topically daily. 45 g 3  . Multiple Vitamins-Minerals (GNP MEGA MULTI FOR MEN) TABS Take 1 tablet by mouth daily.    . Probiotic Product (ALIGN) 4 MG CAPS Take 1 capsule by mouth daily.     . tamsulosin (FLOMAX) 0.4 MG CAPS capsule Take 1 capsule (0.4 mg total) by mouth daily after supper. 90 capsule 1  . baclofen (LIORESAL) 10 MG tablet TK 1 T PO BID 60 tablet 2  . ciprofloxacin (CIPRO) 500 MG tablet Take 1 tablet (500 mg total) by mouth 2 (two) times daily. 60 tablet 0  . mometasone-formoterol (DULERA) 100-5 MCG/ACT AERO Inhale 2 puffs into the lungs 2 (two) times daily.     No facility-administered medications prior to visit.    ROS Review of Systems    Constitutional: Negative.  Negative for fever, chills, diaphoresis, appetite change and fatigue.  HENT: Negative.  Negative for sinus pressure and trouble swallowing.   Eyes: Negative.  Negative for visual disturbance.  Respiratory: Negative.  Negative for cough, choking, chest tightness, shortness of breath and stridor.   Cardiovascular: Negative.  Negative for chest pain, palpitations and leg swelling.  Gastrointestinal: Negative.  Negative for nausea, vomiting, abdominal pain, diarrhea and constipation.  Endocrine: Negative.   Genitourinary: Negative.  Negative for dysuria, hematuria, decreased urine volume and difficulty urinating.  Musculoskeletal: Positive for neck pain. Negative for back pain and arthralgias.  Skin: Negative.  Negative for color change and rash.  Allergic/Immunologic: Negative.   Neurological: Negative.  Negative for dizziness, tremors, light-headedness and headaches.  Hematological: Negative.  Negative for adenopathy. Does not bruise/bleed easily.  Psychiatric/Behavioral: Negative.     Objective:  BP 140/70 mmHg  Pulse 72  Temp(Src) 98.4 F (36.9 C) (Oral)  Resp 16  Ht 5\' 10"  (1.778 m)  Wt 215 lb (97.523 kg)  BMI 30.85 kg/m2  SpO2 99%  BP Readings from Last 3 Encounters:  03/08/16 140/70  11/03/15 138/74  10/13/15 120/60    Wt Readings from Last 3 Encounters:  03/08/16 215 lb (97.523 kg)  11/03/15 214 lb (97.07 kg)  10/13/15 209 lb (94.802 kg)    Physical  Exam  Constitutional: He is oriented to person, place, and time. No distress.  HENT:  Mouth/Throat: Oropharynx is clear and moist. No oropharyngeal exudate.  Eyes: Conjunctivae are normal. Right eye exhibits no discharge. Left eye exhibits no discharge. No scleral icterus.  Neck: Normal range of motion. Neck supple. No JVD present. No tracheal deviation present. No thyromegaly present.  Cardiovascular: Normal rate, regular rhythm, normal heart sounds and intact distal pulses.  Exam reveals no  gallop and no friction rub.   No murmur heard. Pulmonary/Chest: Effort normal and breath sounds normal. No stridor. No respiratory distress. He has no wheezes. He has no rales. He exhibits no tenderness.  Abdominal: Soft. Bowel sounds are normal. He exhibits no distension and no mass. There is no tenderness. There is no rebound and no guarding.  Musculoskeletal: Normal range of motion. He exhibits no edema or tenderness.  Lymphadenopathy:    He has no cervical adenopathy.  Neurological: He is oriented to person, place, and time.  Skin: Skin is warm and dry. No rash noted. He is not diaphoretic. No erythema. No pallor.  Vitals reviewed.   Lab Results  Component Value Date   WBC 11.3* 03/08/2016   HGB 13.5 03/08/2016   HCT 40.6 03/08/2016   PLT 244.0 03/08/2016   GLUCOSE 90 03/08/2016   CHOL 181 03/08/2016   TRIG 221.0* 03/08/2016   HDL 63.10 03/08/2016   LDLDIRECT 75.0 03/08/2016   LDLCALC 193* 04/17/2015   ALT 24 10/21/2013   AST 23 10/21/2013   NA 141 03/08/2016   K 3.7 03/08/2016   CL 104 03/08/2016   CREATININE 1.44 03/08/2016   BUN 13 03/08/2016   CO2 28 03/08/2016   TSH 1.02 03/08/2016   PSA 4.40* 03/08/2016   INR 0.9 10/17/2007   HGBA1C 6.1 04/02/2013    Mr Cervical Spine Wo Contrast  09/09/2015  CLINICAL DATA:  Right-sided neck and right shoulder pain for 6 weeks. EXAM: MRI CERVICAL SPINE WITHOUT CONTRAST TECHNIQUE: Multiplanar, multisequence MR imaging of the cervical spine was performed. No intravenous contrast was administered. COMPARISON:  None. FINDINGS: There straightening of the normal cervical lordosis. There is trace anterolisthesis of C3 on C4 and C4 on C5. Advanced disc space narrowing and degenerative endplate changes are present at C5-6 and C6-7, including mild degenerative edema at C6-7. Mild edema is associated with left C2-3 and right C3-4 facet arthritis. The cervical spinal cord is normal in caliber and signal. Paraspinal soft tissues are unremarkable.  C2-3: Minimal disc bulging, mild left uncovertebral spurring, and moderate to severe left facet arthrosis result in mild left neural foraminal stenosis without spinal stenosis. C3-4: Broad central disc protrusion, uncovertebral spurring, and moderate to severe right and moderate left facet arthrosis result in mild bilateral neural foraminal stenosis without spinal stenosis. C4-5: Mild disc bulging, uncovertebral spurring, and severe right and mild left facet arthrosis result in moderate right neural foraminal stenosis without spinal stenosis. C5-6: Broad-based posterior disc osteophyte complex results in mild bilateral neural foraminal stenosis without spinal stenosis. C6-7: Broad-based posterior disc osteophyte complex results in moderate bilateral neural foraminal stenosis without spinal stenosis. C7-T1: Moderate right and mild left facet arthrosis without significant stenosis. IMPRESSION: 1. Advanced lower cervical disc degeneration, worst at C6-7 where there is moderate bilateral neural foraminal stenosis. No spinal stenosis. 2. Moderate to severe multilevel facet arthrosis. Electronically Signed   By: Logan Bores M.D.   On: 09/09/2015 17:12    Assessment & Plan:   Ronald Bond was seen today for anemia,  hypothyroidism, hyperlipidemia and hypertension.  Diagnoses and all orders for this visit:  Other specified hypothyroidism- his TSH is in the normal range, he will remain on the current dose of Synthroid. -     TSH; Future -     Lipid panel; Future  BPH associated with nocturia- his PSA has come down quite a bit, he is currently asymptomatic and has no signs of residual infection, will continue to monitor his PSA in the next few months.  Hyperlipidemia with target LDL less than 100  Essential hypertension, benign- his blood pressure is well-controlled, electrolytes and renal function are stable. -     CBC with Differential/Platelet; Future -     Basic metabolic panel; Future  Radiculitis of right  cervical region -     baclofen (LIORESAL) 10 MG tablet; TK 1 T PO BID -     HYDROcodone-ibuprofen (VICOPROFEN) 7.5-200 MG tablet; Take 1 tablet by mouth every 8 (eight) hours as needed for moderate pain.  Mild intermittent asthma, uncomplicated -     mometasone-formoterol (DULERA) 100-5 MCG/ACT AERO; Inhale 2 puffs into the lungs 2 (two) times daily.  PSA elevation -     Urinalysis, Routine w reflex microscopic (not at [facility]); Future -     PSA; Future  Spinal stenosis in cervical region -     baclofen (LIORESAL) 10 MG tablet; TK 1 T PO BID -     HYDROcodone-ibuprofen (VICOPROFEN) 7.5-200 MG tablet; Take 1 tablet by mouth every 8 (eight) hours as needed for moderate pain.  Deficiency anemia- the anemia has resolved and his vitamin levels are normal, will continue to follow this. -     IBC panel; Future -     Folate; Future -     Ferritin; Future -     Vitamin B12; Future  Need for 23-polyvalent pneumococcal polysaccharide vaccine -     Pneumococcal polysaccharide vaccine 23-valent greater than or equal to 2yo subcutaneous/IM  I have discontinued Ronald Bond ciprofloxacin. I am also having him start on HYDROcodone-ibuprofen. Additionally, I am having him maintain his ALIGN, aspirin, GNP MEGA MULTI FOR MEN, doxepin, mometasone, atorvastatin, tamsulosin, levothyroxine, amLODipine, losartan, mometasone-formoterol, and baclofen.  Meds ordered this encounter  Medications  . mometasone-formoterol (DULERA) 100-5 MCG/ACT AERO    Sig: Inhale 2 puffs into the lungs 2 (two) times daily.    Dispense:  13 g    Refill:  11  . baclofen (LIORESAL) 10 MG tablet    Sig: TK 1 T PO BID    Dispense:  60 tablet    Refill:  3  . HYDROcodone-ibuprofen (VICOPROFEN) 7.5-200 MG tablet    Sig: Take 1 tablet by mouth every 8 (eight) hours as needed for moderate pain.    Dispense:  65 tablet    Refill:  0     Follow-up: Return in about 4 months (around 07/08/2016).  Scarlette Calico, MD

## 2016-03-08 NOTE — Patient Instructions (Signed)
Anemia, Nonspecific Anemia is a condition in which the concentration of red blood cells or hemoglobin in the blood is below normal. Hemoglobin is a substance in red blood cells that carries oxygen to the tissues of the body. Anemia results in not enough oxygen reaching these tissues.  CAUSES  Common causes of anemia include:   Excessive bleeding. Bleeding may be internal or external. This includes excessive bleeding from periods (in women) or from the intestine.   Poor nutrition.   Chronic kidney, thyroid, and liver disease.  Bone marrow disorders that decrease red blood cell production.  Cancer and treatments for cancer.  HIV, AIDS, and their treatments.  Spleen problems that increase red blood cell destruction.  Blood disorders.  Excess destruction of red blood cells due to infection, medicines, and autoimmune disorders. SIGNS AND SYMPTOMS   Minor weakness.   Dizziness.   Headache.  Palpitations.   Shortness of breath, especially with exercise.   Paleness.  Cold sensitivity.  Indigestion.  Nausea.  Difficulty sleeping.  Difficulty concentrating. Symptoms may occur suddenly or they may develop slowly.  DIAGNOSIS  Additional blood tests are often needed. These help your health care provider determine the best treatment. Your health care provider will check your stool for blood and look for other causes of blood loss.  TREATMENT  Treatment varies depending on the cause of the anemia. Treatment can include:   Supplements of iron, vitamin B12, or folic acid.   Hormone medicines.   A blood transfusion. This may be needed if blood loss is severe.   Hospitalization. This may be needed if there is significant continual blood loss.   Dietary changes.  Spleen removal. HOME CARE INSTRUCTIONS Keep all follow-up appointments. It often takes many weeks to correct anemia, and having your health care provider check on your condition and your response to  treatment is very important. SEEK IMMEDIATE MEDICAL CARE IF:   You develop extreme weakness, shortness of breath, or chest pain.   You become dizzy or have trouble concentrating.  You develop heavy vaginal bleeding.   You develop a rash.   You have bloody or black, tarry stools.   You faint.   You vomit up blood.   You vomit repeatedly.   You have abdominal pain.  You have a fever or persistent symptoms for more than 2-3 days.   You have a fever and your symptoms suddenly get worse.   You are dehydrated.  MAKE SURE YOU:  Understand these instructions.  Will watch your condition.  Will get help right away if you are not doing well or get worse.   This information is not intended to replace advice given to you by your health care provider. Make sure you discuss any questions you have with your health care provider.   Document Released: 12/22/2004 Document Revised: 07/17/2013 Document Reviewed: 05/10/2013 Elsevier Interactive Patient Education 2016 [employer]  

## 2016-03-08 NOTE — Progress Notes (Signed)
Pre visit review using our clinic review tool, if applicable. No additional management support is needed unless otherwise documented below in the visit note. 

## 2016-03-09 ENCOUNTER — Encounter: Payer: Self-pay | Admitting: Internal Medicine

## 2016-03-09 NOTE — Telephone Encounter (Signed)
APPROVED through 11/27/2016 

## 2016-03-10 MED FILL — HYDROCOD-IBU 7.5-200 TAB: 7.5-200 | 22 days supply | Qty: 65 | Fill #0

## 2016-03-22 MED FILL — ATORVASTATIN 40 MG TABLET: 40 | 30 days supply | Qty: 30 | Fill #2 | Status: TO

## 2016-03-22 MED FILL — LEVOTHYROXINE 150 MCG TAB: 150 | 30 days supply | Qty: 30 | Fill #4

## 2016-03-22 MED FILL — LOSARTAN POTASSIUM 100 MG T: 100 | 30 days supply | Qty: 30 | Fill #1 | Status: TO

## 2016-03-22 MED FILL — AMLODIPINE BESYLATE 10 MG T: 10 | 30 days supply | Qty: 30 | Fill #1

## 2016-03-23 MED FILL — TAMSULOSIN HCL 0.4 MG CAP: 0.4 | 30 days supply | Qty: 30 | Fill #1

## 2016-04-20 DIAGNOSIS — H10413 Chronic giant papillary conjunctivitis, bilateral: Secondary | ICD-10-CM | POA: Diagnosis not present

## 2016-04-20 DIAGNOSIS — H11153 Pinguecula, bilateral: Secondary | ICD-10-CM | POA: Diagnosis not present

## 2016-04-20 DIAGNOSIS — H05243 Constant exophthalmos, bilateral: Secondary | ICD-10-CM | POA: Diagnosis not present

## 2016-04-20 DIAGNOSIS — Z961 Presence of intraocular lens: Secondary | ICD-10-CM | POA: Diagnosis not present

## 2016-04-20 MED FILL — CROMOLYN 4% EYE DROPS: 4 | 25 days supply | Qty: 10 | Fill #0

## 2016-04-20 MED FILL — AMLODIPINE BESYLATE 10 MG T: 10 | 30 days supply | Qty: 30 | Fill #2

## 2016-04-20 MED FILL — LEVOTHYROXINE 150 MCG TAB: 150 | 30 days supply | Qty: 30 | Fill #5

## 2016-04-20 MED FILL — TAMSULOSIN HCL 0.4 MG CAP: 0.4 | 30 days supply | Qty: 30 | Fill #2

## 2016-04-20 MED FILL — LOSARTAN POTASSIUM 100 MG T: 100 | 30 days supply | Qty: 30 | Fill #2 | Status: TO

## 2016-04-20 MED FILL — ATORVASTATIN 40 MG TABLET: 40 | 30 days supply | Qty: 30 | Fill #3 | Status: TO

## 2016-05-23 ENCOUNTER — Other Ambulatory Visit: Payer: Self-pay | Admitting: Internal Medicine

## 2016-05-23 DIAGNOSIS — H10413 Chronic giant papillary conjunctivitis, bilateral: Secondary | ICD-10-CM | POA: Diagnosis not present

## 2016-05-23 DIAGNOSIS — Z961 Presence of intraocular lens: Secondary | ICD-10-CM | POA: Diagnosis not present

## 2016-05-23 DIAGNOSIS — H11153 Pinguecula, bilateral: Secondary | ICD-10-CM | POA: Diagnosis not present

## 2016-05-23 MED FILL — TAMSULOSIN HCL 0.4 MG CAP: 0.4 | 30 days supply | Qty: 30 | Fill #3

## 2016-05-23 MED FILL — AMLODIPINE BESYLATE 10 MG T: 10 | 30 days supply | Qty: 30 | Fill #3

## 2016-05-23 MED FILL — ATORVASTATIN 40 MG TABLET: 40 | 30 days supply | Qty: 30 | Fill #4 | Status: TO

## 2016-05-23 MED FILL — PATADAY 0.2% EYE DROPS: 0.2 | 25 days supply | Qty: 3 | Fill #0

## 2016-05-23 MED FILL — LEVOTHYROXINE 150 MCG TAB: 150 | 30 days supply | Qty: 30 | Fill #0

## 2016-05-23 MED FILL — LOSARTAN POTASSIUM 100 MG T: 100 | 30 days supply | Qty: 30 | Fill #3 | Status: TO

## 2016-06-16 ENCOUNTER — Ambulatory Visit (INDEPENDENT_AMBULATORY_CARE_PROVIDER_SITE_OTHER): Payer: MEDICARE | Admitting: Internal Medicine

## 2016-06-16 ENCOUNTER — Encounter: Payer: Self-pay | Admitting: Internal Medicine

## 2016-06-16 VITALS — BP 124/70 | HR 77 | Temp 98.1°F | Resp 20 | Wt 217.0 lb

## 2016-06-16 DIAGNOSIS — M5412 Radiculopathy, cervical region: Secondary | ICD-10-CM

## 2016-06-16 MED ORDER — GABAPENTIN 300 MG PO CAPS: 300.0000 mg | ORAL_CAPSULE | Freq: Three times a day (TID) | ORAL | Status: DC

## 2016-06-16 MED ORDER — HYDROCODONE-ACETAMINOPHEN 10-325 MG PO TABS: 1.0000 | ORAL_TABLET | Freq: Four times a day (QID) | ORAL | Status: DC | PRN

## 2016-06-16 MED ORDER — CYCLOBENZAPRINE HCL 5 MG PO TABS: 5.0000 mg | ORAL_TABLET | Freq: Three times a day (TID) | ORAL | Status: DC | PRN

## 2016-06-16 MED ORDER — PREDNISONE 10 MG PO TABS: ORAL_TABLET | ORAL | Status: DC

## 2016-06-16 MED FILL — CYCLOBENZAPRINE 5 MG TABLET: 5 | 10 days supply | Qty: 30 | Fill #0

## 2016-06-16 MED FILL — predniSONE 10 MG TABS: 10 | 9 days supply | Qty: 18 | Fill #0

## 2016-06-16 MED FILL — GABAPENTIN 300 MG CAPSULE: 300 | 30 days supply | Qty: 90 | Fill #0

## 2016-06-16 NOTE — Patient Instructions (Addendum)
OK to stop the vicoprofen and baclofen  Please take all new medication as prescribed - the hydrocodone 10/325, flexeril as needede (muscle relaxer), prednisone for a short time, and gabapentin 300 mg three times per day (and call if you get too sleepy with this one)  Please continue all other medications as before, and refills have been done if requested.  Please have the pharmacy call with any other refills you may need.  Please keep your appointments with your specialists as you may have planned  You will be contacted regarding the referral for: MRI cervical spine (neck spine), and the Neurosurgeon  Please make appt with Dr Ronnald Ramp in 2 weeks as well if you do not already have an appt

## 2016-06-16 NOTE — Progress Notes (Signed)
Pre visit review using our clinic review tool, if applicable. No additional management support is needed unless otherwise documented below in the visit note. 

## 2016-06-16 NOTE — Progress Notes (Signed)
Subjective:    Patient ID: Ronald Bond, male    DOB: 15-Jul-1946, 70 y.o.   MRN: [id number]  HPI  Here with persistent 6 mo now mod to severe in the past wk of constant right shoulder pain that seems to start at the right neck and radiate to the RUE, assoc with some weakness and numbness, but no, bowel or bladder change, fever, wt loss,  worsening LE pain/numbness/weakness, gait change or falls  Cant seem to hold things well with the right hand sometimes. . Pt denies chest pain, increased sob or doe, wheezing, orthopnea, PND, increased LE swelling, palpitations, dizziness or syncope.  Pt denies new neurological symptoms such as new headache, or facial or extremity weakness or numbness except for the above.   Pt denies polydipsia, polyuria Past Medical History:  Diagnosis Date  . GERD (gastroesophageal reflux disease)   . HTN (hypertension)   . Hyperlipidemia   . Hypothyroidism   . Osteoarthritis   . Wears glasses    Past Surgical History:  Procedure Laterality Date  . COLONOSCOPY    . EYE SURGERY     both cataracts  . MICROLARYNGOSCOPY Left 11/18/2013   Procedure: MICROLARYNGOSCOPY WITH REMOVAL OF GRANULOMA LEFT SIDE;  Surgeon: Izora Gala, MD;  Location: [place];  Service: ENT;  Laterality: Left;  . THYROIDECTOMY  1974  . TONSILLECTOMY    . TOTAL KNEE ARTHROPLASTY  2009   left    reports that he has never smoked. He has never used smokeless tobacco. He reports that he does not drink alcohol or use drugs. family history includes Brain cancer in his mother; Heart disease in his brother and father. Allergies  Allergen Reactions  . Lisinopril     REACTION: edema   Current Outpatient Prescriptions on File Prior to Visit  Medication Sig Dispense Refill  . amLODipine (NORVASC) 10 MG tablet TAKE 1 TABLET BY MOUTH DAILY 90 tablet 3  . aspirin 81 MG EC tablet Take 81 mg by mouth daily.      Marland Kitchen atorvastatin (LIPITOR) 40 MG tablet TAKE 1 TABLET BY MOUTH DAILY. 90  tablet 3  . baclofen (LIORESAL) 10 MG tablet TK 1 T PO BID 60 tablet 3  . doxepin (SINEQUAN) 25 MG capsule Take 1 capsule (25 mg total) by mouth at bedtime as needed. 30 capsule 11  . levothyroxine (SYNTHROID, LEVOTHROID) 150 MCG tablet Take 1 tablet (150 mcg total) by mouth daily before breakfast. 90 tablet 1  . losartan (COZAAR) 100 MG tablet TAKE 1 TABLET BY MOUTH DAILY 145 tablet 1  . mometasone-formoterol (DULERA) 100-5 MCG/ACT AERO Inhale 2 puffs into the lungs 2 (two) times daily. 13 g 11  . Multiple Vitamins-Minerals (GNP MEGA MULTI FOR MEN) TABS Take 1 tablet by mouth daily.    . Probiotic Product (ALIGN) 4 MG CAPS Take 1 capsule by mouth daily.     . tamsulosin (FLOMAX) 0.4 MG CAPS capsule Take 1 capsule (0.4 mg total) by mouth daily after supper. 90 capsule 1   No current facility-administered medications on file prior to visit.    Review of Systems  Constitutional: Negative for unusual diaphoresis or night sweats HENT: Negative for ear swelling or discharge Eyes: Negative for worsening visual haziness  Respiratory: Negative for choking and stridor.   Gastrointestinal: Negative for distension or worsening eructation Genitourinary: Negative for retention or change in urine volume.  Musculoskeletal: Negative for other MSK pain or swelling Skin: Negative for color change and worsening wound  Neurological: Negative for tremors and numbness other than noted  Psychiatric/Behavioral: Negative for decreased concentration or agitation other than above       Objective:   Physical Exam BP 124/70   Pulse 77   Temp 98.1 F (36.7 C) (Oral)   Resp 20   Wt 217 lb (98.4 kg)   SpO2 98%   BMI 31.14 kg/m   VS noted,  Constitutional: Pt appears in no apparent distress HENT: Head: NCAT.  Right Ear: External ear normal.  Left Ear: External ear normal.  Eyes: . Pupils are equal, round, and reactive to light. Conjunctivae and EOM are normal Neck: Normal range of motion. Neck supple.    Cardiovascular: Normal rate and regular rhythm.   Pulmonary/Chest: Effort normal and breath sounds without rales or wheezing.  Abd:  Soft, NT, ND, + BS Neurological: Pt is alert. Not confused , motor 4+/5 RUE o/w intact with + area of tricep atrophy noted, sens intact, dtr diminished tricep Skin: Skin is warm. No rash, no LE edema Psychiatric: Pt behavior is normal. No agitation.   Most recent c-spine films - oict 2016 IMPRESSION: 1. Advanced lower cervical disc degeneration, worst at C6-7 where there is moderate bilateral neural foraminal stenosis. No spinal stenosis. 2. Moderate to severe multilevel facet arthrosis.   Electronically Signed  By: Logan Bores M.D.  On: 09/09/2015 17:12     Assessment & Plan:

## 2016-06-19 NOTE — Assessment & Plan Note (Signed)
Mild for 6 mo, now worsening pain, weakness of arm and grip and probable area of right tricep atrophy, for hydrocodone prn, gabapentin prn, predpac trial, MRI c-spine and NS referral,  to f/u any worsening symptoms or concerns

## 2016-06-29 MED FILL — ATORVASTATIN 40 MG TABLET: 40 | 30 days supply | Qty: 30 | Fill #5 | Status: TO

## 2016-07-03 ENCOUNTER — Ambulatory Visit
Admission: RE | Admit: 2016-07-03 | Discharge: 2016-07-03 | Disposition: A | Discharge status: Home / Self Care | Payer: MEDICARE | Source: Ambulatory Visit | Attending: Internal Medicine | Admitting: Internal Medicine

## 2016-07-03 DIAGNOSIS — M5412 Radiculopathy, cervical region: Secondary | ICD-10-CM

## 2016-07-03 DIAGNOSIS — M50222 Other cervical disc displacement at C5-C6 level: Secondary | ICD-10-CM | POA: Diagnosis not present

## 2016-07-03 DIAGNOSIS — M50221 Other cervical disc displacement at C4-C5 level: Secondary | ICD-10-CM | POA: Diagnosis not present

## 2016-07-03 DIAGNOSIS — M50223 Other cervical disc displacement at C6-C7 level: Secondary | ICD-10-CM | POA: Diagnosis not present

## 2016-07-04 MED FILL — LEVOTHYROXINE 150 MCG TAB: 150 | 30 days supply | Qty: 30 | Fill #1

## 2016-07-05 ENCOUNTER — Other Ambulatory Visit: Payer: Self-pay | Admitting: Internal Medicine

## 2016-07-05 DIAGNOSIS — M5412 Radiculopathy, cervical region: Secondary | ICD-10-CM

## 2016-07-12 DIAGNOSIS — M5030 Other cervical disc degeneration, unspecified cervical region: Secondary | ICD-10-CM | POA: Diagnosis not present

## 2016-07-12 DIAGNOSIS — M7581 Other shoulder lesions, right shoulder: Secondary | ICD-10-CM | POA: Diagnosis not present

## 2016-07-12 DIAGNOSIS — M5412 Radiculopathy, cervical region: Secondary | ICD-10-CM | POA: Diagnosis not present

## 2016-07-14 ENCOUNTER — Other Ambulatory Visit: Payer: Self-pay | Admitting: Neurosurgery

## 2016-07-14 DIAGNOSIS — Z6830 Body mass index (BMI) 30.0-30.9, adult: Secondary | ICD-10-CM | POA: Diagnosis not present

## 2016-07-14 DIAGNOSIS — M542 Cervicalgia: Secondary | ICD-10-CM | POA: Diagnosis not present

## 2016-07-22 ENCOUNTER — Other Ambulatory Visit: Payer: MEDICARE

## 2016-07-22 DIAGNOSIS — N410 Acute prostatitis: Secondary | ICD-10-CM | POA: Diagnosis not present

## 2016-07-22 DIAGNOSIS — R300 Dysuria: Secondary | ICD-10-CM | POA: Diagnosis not present

## 2016-07-22 DIAGNOSIS — Z6832 Body mass index (BMI) 32.0-32.9, adult: Secondary | ICD-10-CM | POA: Diagnosis not present

## 2016-07-26 ENCOUNTER — Telehealth: Payer: Self-pay | Admitting: Internal Medicine

## 2016-07-26 DIAGNOSIS — N419 Inflammatory disease of prostate, unspecified: Secondary | ICD-10-CM

## 2016-07-26 NOTE — Telephone Encounter (Signed)
Urology referral signed.

## 2016-07-26 NOTE — Telephone Encounter (Signed)
Pt called in, said that he was out of town last week and went to urgent care.  They told him he needs to see a urologists .  He wanted to see if Dr Ronnald Ramp could pt a referral in to see Urology?

## 2016-07-26 NOTE — Telephone Encounter (Signed)
Pt informed via detailed message regarding referral.

## 2016-07-26 NOTE — Telephone Encounter (Signed)
Contacted pt.  Urgent Care in [facility] did a UA with culture. Pt was told he had a UTI and Prostatitis.   Urology referral okay? Pended in this encounter for your review.

## 2016-07-28 ENCOUNTER — Ambulatory Visit
Admission: RE | Admit: 2016-07-28 | Discharge: 2016-07-28 | Disposition: A | Discharge status: Home / Self Care | Payer: MEDICARE | Source: Ambulatory Visit | Attending: Neurosurgery | Admitting: Neurosurgery

## 2016-07-28 VITALS — BP 152/77 | HR 81

## 2016-07-28 DIAGNOSIS — M4802 Spinal stenosis, cervical region: Secondary | ICD-10-CM

## 2016-07-28 DIAGNOSIS — M5412 Radiculopathy, cervical region: Secondary | ICD-10-CM

## 2016-07-28 DIAGNOSIS — M542 Cervicalgia: Secondary | ICD-10-CM

## 2016-07-28 MED ORDER — DIAZEPAM 5 MG PO TABS
5.0000 mg | ORAL_TABLET | Freq: Once | ORAL | Status: AC
  Administered 2016-07-28: 5 mg via ORAL

## 2016-07-28 MED ORDER — IOPAMIDOL (ISOVUE-M 300) INJECTION 61%
10.0000 mL | Freq: Once | INTRAMUSCULAR | Status: AC | PRN
  Administered 2016-07-28: 10 mL via INTRATHECAL

## 2016-07-28 NOTE — Discharge Instructions (Signed)

## 2016-08-02 ENCOUNTER — Telehealth: Payer: Self-pay

## 2016-08-02 NOTE — Telephone Encounter (Signed)
Spoke with patient after his myelogram here 07/28/16 and he says he's doing just wonderfully.  No headache!  jkl

## 2016-08-05 ENCOUNTER — Other Ambulatory Visit: Payer: Self-pay | Admitting: Internal Medicine

## 2016-08-05 DIAGNOSIS — R351 Nocturia: Principal | ICD-10-CM

## 2016-08-05 DIAGNOSIS — N401 Enlarged prostate with lower urinary tract symptoms: Secondary | ICD-10-CM

## 2016-08-05 DIAGNOSIS — N410 Acute prostatitis: Secondary | ICD-10-CM

## 2016-08-05 MED FILL — LOSARTAN POTASSIUM 100 MG T: 100 | 30 days supply | Qty: 30 | Fill #4 | Status: TO

## 2016-08-05 MED FILL — ATORVASTATIN 40 MG TABLET: 40 | 30 days supply | Qty: 30 | Fill #0

## 2016-08-05 MED FILL — PATADAY 0.2% EYE DROPS: 0.2 | 25 days supply | Qty: 3 | Fill #1

## 2016-08-05 MED FILL — TAMSULOSIN HCL 0.4 MG CAP: 0.4 | 30 days supply | Qty: 90 | Fill #0

## 2016-08-05 MED FILL — AMLODIPINE BESYLATE 10 MG T: 10 | 30 days supply | Qty: 30 | Fill #4

## 2016-08-05 MED FILL — LEVOTHYROXINE 150 MCG TAB: 150 | 30 days supply | Qty: 30 | Fill #2

## 2016-08-05 MED FILL — CYCLOBENZAPRINE 5 MG TABLET: 5 | 10 days supply | Qty: 30 | Fill #1

## 2016-08-15 DIAGNOSIS — M5412 Radiculopathy, cervical region: Secondary | ICD-10-CM | POA: Diagnosis not present

## 2016-08-15 DIAGNOSIS — Z6830 Body mass index (BMI) 30.0-30.9, adult: Secondary | ICD-10-CM | POA: Diagnosis not present

## 2016-08-16 ENCOUNTER — Encounter: Payer: Self-pay | Admitting: Internal Medicine

## 2016-08-18 ENCOUNTER — Other Ambulatory Visit: Payer: Self-pay | Admitting: Neurosurgery

## 2016-08-18 DIAGNOSIS — M5412 Radiculopathy, cervical region: Secondary | ICD-10-CM

## 2016-08-29 ENCOUNTER — Ambulatory Visit
Admission: RE | Admit: 2016-08-29 | Discharge: 2016-08-29 | Disposition: A | Discharge status: Home / Self Care | Payer: MEDICARE | Source: Ambulatory Visit | Attending: Neurosurgery | Admitting: Neurosurgery

## 2016-08-29 DIAGNOSIS — M50322 Other cervical disc degeneration at C5-C6 level: Secondary | ICD-10-CM | POA: Diagnosis not present

## 2016-08-29 DIAGNOSIS — M5412 Radiculopathy, cervical region: Secondary | ICD-10-CM

## 2016-08-29 MED ORDER — TRIAMCINOLONE ACETONIDE 40 MG/ML IJ SUSP (RADIOLOGY)
60.0000 mg | Freq: Once | INTRAMUSCULAR | Status: AC
  Administered 2016-08-29: 60 mg via EPIDURAL

## 2016-08-29 MED ORDER — IOPAMIDOL (ISOVUE-M 300) INJECTION 61%
1.0000 mL | Freq: Once | INTRAMUSCULAR | Status: AC | PRN
  Administered 2016-08-29: 1 mL via EPIDURAL

## 2016-08-29 NOTE — Discharge Instructions (Signed)

## 2016-09-01 MED FILL — PATADAY 0.2% EYE DROPS: 0.2 | 25 days supply | Qty: 3 | Fill #2

## 2016-09-08 MED FILL — LOSARTAN POTASSIUM 100 MG T: 100 | 30 days supply | Qty: 30 | Fill #5 | Status: TO

## 2016-09-08 MED FILL — LEVOTHYROXINE 150 MCG TAB: 150 | 30 days supply | Qty: 30 | Fill #3

## 2016-09-08 MED FILL — AMLODIPINE BESYLATE 10 MG T: 10 | 30 days supply | Qty: 30 | Fill #5

## 2016-09-08 MED FILL — TAMSULOSIN HCL 0.4 MG CAP: 0.4 | 30 days supply | Qty: 90 | Fill #1

## 2016-09-08 MED FILL — ATORVASTATIN 40 MG TABLET: 40 | 30 days supply | Qty: 30 | Fill #1

## 2016-09-23 DIAGNOSIS — R351 Nocturia: Secondary | ICD-10-CM | POA: Diagnosis not present

## 2016-09-23 DIAGNOSIS — N410 Acute prostatitis: Secondary | ICD-10-CM | POA: Diagnosis not present

## 2016-09-23 DIAGNOSIS — N401 Enlarged prostate with lower urinary tract symptoms: Secondary | ICD-10-CM | POA: Diagnosis not present

## 2016-09-23 MED FILL — OXYBUTYNIN 5 MG TABLET: 5 | 30 days supply | Qty: 60 | Fill #0

## 2016-09-27 ENCOUNTER — Encounter: Payer: Self-pay | Admitting: Internal Medicine

## 2016-09-27 ENCOUNTER — Ambulatory Visit (INDEPENDENT_AMBULATORY_CARE_PROVIDER_SITE_OTHER): Payer: MEDICARE | Admitting: Internal Medicine

## 2016-09-27 ENCOUNTER — Other Ambulatory Visit (INDEPENDENT_AMBULATORY_CARE_PROVIDER_SITE_OTHER): Payer: MEDICARE

## 2016-09-27 VITALS — BP 160/80 | HR 41 | Temp 98.0°F | Resp 16 | Ht 70.0 in | Wt 214.4 lb

## 2016-09-27 DIAGNOSIS — I499 Cardiac arrhythmia, unspecified: Secondary | ICD-10-CM

## 2016-09-27 DIAGNOSIS — R7989 Other specified abnormal findings of blood chemistry: Secondary | ICD-10-CM

## 2016-09-27 DIAGNOSIS — I493 Ventricular premature depolarization: Secondary | ICD-10-CM | POA: Diagnosis not present

## 2016-09-27 DIAGNOSIS — R945 Abnormal results of liver function studies: Secondary | ICD-10-CM

## 2016-09-27 DIAGNOSIS — Z299 Encounter for prophylactic measures, unspecified: Secondary | ICD-10-CM

## 2016-09-27 DIAGNOSIS — K219 Gastro-esophageal reflux disease without esophagitis: Secondary | ICD-10-CM

## 2016-09-27 DIAGNOSIS — E039 Hypothyroidism, unspecified: Secondary | ICD-10-CM

## 2016-09-27 DIAGNOSIS — I10 Essential (primary) hypertension: Secondary | ICD-10-CM

## 2016-09-27 DIAGNOSIS — M5412 Radiculopathy, cervical region: Secondary | ICD-10-CM

## 2016-09-27 DIAGNOSIS — M4802 Spinal stenosis, cervical region: Secondary | ICD-10-CM

## 2016-09-27 DIAGNOSIS — E785 Hyperlipidemia, unspecified: Secondary | ICD-10-CM

## 2016-09-27 DIAGNOSIS — J4521 Mild intermittent asthma with (acute) exacerbation: Secondary | ICD-10-CM

## 2016-09-27 LAB — CBC WITH DIFFERENTIAL/PLATELET
Basophils Absolute: 0 10*3/uL (ref 0.0–0.1)
Basophils Relative: 0.3 % (ref 0.0–3.0)
EOS PCT: 7.3 % — AB (ref 0.0–5.0)
Eosinophils Absolute: 1 10*3/uL — ABNORMAL HIGH (ref 0.0–0.7)
HCT: 39.1 % (ref 39.0–52.0)
Hemoglobin: 13 g/dL (ref 13.0–17.0)
LYMPHS ABS: 2.3 10*3/uL (ref 0.7–4.0)
Lymphocytes Relative: 17 % (ref 12.0–46.0)
MCHC: 33.3 g/dL (ref 30.0–36.0)
MCV: 90 fl (ref 78.0–100.0)
MONO ABS: 1.3 10*3/uL — AB (ref 0.1–1.0)
MONOS PCT: 9.9 % (ref 3.0–12.0)
NEUTROS ABS: 8.7 10*3/uL — AB (ref 1.4–7.7)
NEUTROS PCT: 65.5 % (ref 43.0–77.0)
PLATELETS: 304 10*3/uL (ref 150.0–400.0)
RBC: 4.34 Mil/uL (ref 4.22–5.81)
RDW: 17.4 % — ABNORMAL HIGH (ref 11.5–15.5)
WBC: 13.3 10*3/uL — ABNORMAL HIGH (ref 4.0–10.5)

## 2016-09-27 LAB — LIPID PANEL
CHOL/HDL RATIO: 3
Cholesterol: 149 mg/dL (ref 0–200)
HDL: 58.6 mg/dL (ref >39.00)
LDL CALC: 53 mg/dL (ref 0–99)
NONHDL: 90.47
TRIGLYCERIDES: 188 mg/dL — AB (ref 0.0–149.0)
VLDL: 37.6 mg/dL (ref 0.0–40.0)

## 2016-09-27 LAB — COMPREHENSIVE METABOLIC PANEL
ALT: 14 U/L (ref 0–53)
AST: 10 U/L (ref 0–37)
Albumin: 3.7 g/dL (ref 3.5–5.2)
Alkaline Phosphatase: 63 U/L (ref 39–117)
BUN: 19 mg/dL (ref 6–23)
CALCIUM: 9.2 mg/dL (ref 8.4–10.5)
CHLORIDE: 101 meq/L (ref 96–112)
CO2: 30 meq/L (ref 19–32)
Creatinine, Ser: 1.3 mg/dL (ref 0.40–1.50)
GFR: 70.02 mL/min (ref >60.00)
GLUCOSE: 99 mg/dL (ref 70–99)
POTASSIUM: 3.9 meq/L (ref 3.5–5.1)
Sodium: 139 mEq/L (ref 135–145)
Total Bilirubin: 0.4 mg/dL (ref 0.2–1.2)
Total Protein: 6.8 g/dL (ref 6.0–8.3)

## 2016-09-27 LAB — TSH: TSH: 0.8 u[IU]/mL (ref 0.35–4.50)

## 2016-09-27 LAB — D-DIMER, QUANTITATIVE: D-Dimer, Quant: 3.19 mcg/mL FEU — ABNORMAL HIGH (ref <0.50)

## 2016-09-27 LAB — MAGNESIUM: MAGNESIUM: 2.2 mg/dL (ref 1.5–2.5)

## 2016-09-27 LAB — TROPONIN I: TNIDX: 0.05 ug/l (ref 0.00–0.06)

## 2016-09-27 MED ORDER — PROMETHAZINE-DM 6.25-15 MG/5ML PO SYRP: 5.0000 mL | ORAL_SOLUTION | Freq: Four times a day (QID) | ORAL | 0 refills | Status: DC | PRN

## 2016-09-27 MED ORDER — AMLODIPINE BESYLATE 10 MG PO TABS: 10.0000 mg | ORAL_TABLET | Freq: Every day | ORAL | 3 refills | Status: DC

## 2016-09-27 MED ORDER — LOSARTAN POTASSIUM 100 MG PO TABS: 100.0000 mg | ORAL_TABLET | Freq: Every day | ORAL | 1 refills | Status: DC

## 2016-09-27 MED ORDER — HYDROCODONE-ACETAMINOPHEN 10-325 MG PO TABS: 1.0000 | ORAL_TABLET | Freq: Four times a day (QID) | ORAL | 0 refills | Status: DC | PRN

## 2016-09-27 MED FILL — PROMETHAZINE-DM SYRUP: 6.25-15 | 6 days supply | Qty: 118 | Fill #0

## 2016-09-27 NOTE — Patient Instructions (Signed)
Hypertension Hypertension, commonly called high blood pressure, is when the force of blood pumping through your arteries is too strong. Your arteries are the blood vessels that carry blood from your heart throughout your body. A blood pressure reading consists of a higher number over a lower number, such as 110/72. The higher number (systolic) is the pressure inside your arteries when your heart pumps. The lower number (diastolic) is the pressure inside your arteries when your heart relaxes. Ideally you want your blood pressure below 120/80. Hypertension forces your heart to work harder to pump blood. Your arteries may become narrow or stiff. Having untreated or uncontrolled hypertension can cause heart attack, stroke, kidney disease, and other problems. RISK FACTORS Some risk factors for high blood pressure are controllable. Others are not.  Risk factors you cannot control include:   Race. You may be at higher risk if you are African American.  Age. Risk increases with age.  Gender. Men are at higher risk than women before age 45 years. After age 65, women are at higher risk than men. Risk factors you can control include:  Not getting enough exercise or physical activity.  Being overweight.  Getting too much fat, sugar, calories, or salt in your diet.  Drinking too much alcohol. SIGNS AND SYMPTOMS Hypertension does not usually cause signs or symptoms. Extremely high blood pressure (hypertensive crisis) may cause headache, anxiety, shortness of breath, and nosebleed. DIAGNOSIS To check if you have hypertension, your health care provider will measure your blood pressure while you are seated, with your arm held at the level of your heart. It should be measured at least twice using the same arm. Certain conditions can cause a difference in blood pressure between your right and left arms. A blood pressure reading that is higher than normal on one occasion does not mean that you need treatment. If  it is not clear whether you have high blood pressure, you may be asked to return on a different day to have your blood pressure checked again. Or, you may be asked to monitor your blood pressure at home for 1 or more weeks. TREATMENT Treating high blood pressure includes making lifestyle changes and possibly taking medicine. Living a healthy lifestyle can help lower high blood pressure. You may need to change some of your habits. Lifestyle changes may include:  Following the DASH diet. This diet is high in fruits, vegetables, and whole grains. It is low in salt, red meat, and added sugars.  Keep your sodium intake below 2,300 mg per day.  Getting at least 30-45 minutes of aerobic exercise at least 4 times per week.  Losing weight if necessary.  Not smoking.  Limiting alcoholic beverages.  Learning ways to reduce stress. Your health care provider may prescribe medicine if lifestyle changes are not enough to get your blood pressure under control, and if one of the following is true:  You are 18-59 years of age and your systolic blood pressure is above 140.  You are 60 years of age or older, and your systolic blood pressure is above 150.  Your diastolic blood pressure is above 90.  You have diabetes, and your systolic blood pressure is over 140 or your diastolic blood pressure is over 90.  You have kidney disease and your blood pressure is above 140/90.  You have heart disease and your blood pressure is above 140/90. Your personal target blood pressure may vary depending on your medical conditions, your age, and other factors. HOME CARE INSTRUCTIONS    Have your blood pressure rechecked as directed by your health care provider.   Take medicines only as directed by your health care provider. Follow the directions carefully. Blood pressure medicines must be taken as prescribed. The medicine does not work as well when you skip doses. Skipping doses also puts you at risk for  problems.  Do not smoke.   Monitor your blood pressure at home as directed by your health care provider. SEEK MEDICAL CARE IF:   You think you are having a reaction to medicines taken.  You have recurrent headaches or feel dizzy.  You have swelling in your ankles.  You have trouble with your vision. SEEK IMMEDIATE MEDICAL CARE IF:  You develop a severe headache or confusion.  You have unusual weakness, numbness, or feel faint.  You have severe chest or abdominal pain.  You vomit repeatedly.  You have trouble breathing. MAKE SURE YOU:   Understand these instructions.  Will watch your condition.  Will get help right away if you are not doing well or get worse.   This information is not intended to replace advice given to you by your health care provider. Make sure you discuss any questions you have with your health care provider.   Document Released: 11/14/2005 Document Revised: 03/31/2015 Document Reviewed: 09/06/2013 Elsevier Interactive Patient Education 2016 Elsevier Inc.  

## 2016-09-27 NOTE — Progress Notes (Signed)
Pre visit review using our clinic review tool, if applicable. No additional management support is needed unless otherwise documented below in the visit note. 

## 2016-09-27 NOTE — Progress Notes (Signed)
Subjective:  Patient ID: Ronald Bond, male    DOB: January 18, 1946  Age: 70 y.o. MRN: [id number]  CC: Palpitations; Hypertension; Hypothyroidism; and Hyperlipidemia   HPI ADNAN GECK presents for follow-up. He tells me he recently took a trip to [place] and while up there he had an exacerbation of asthma and was treated with a course of steroids and antibiotics. He tells me in some ways she is starting to feel better but for the last week he has noticed intermittent episodes of an irregular heartbeat with dizziness and staggering. His cough is much better. It was productive of thick yellow phlegm 2 weeks ago but now it is productive only of clear phlegm. He has had a few episodes of wheezing and shortness of breath but he denies chest pain, syncope, lightheadedness, or edema.  He also complains of chronic neck and right shoulder pain that keeps him awake at night. He has had some tingling in his right upper extremity. He is being followed by a specialist about this and has recently had some steroid injections. He is requesting a refill on hydrocodone to control the pain and help him sleep at night.  Outpatient Medications Prior to Visit  Medication Sig Dispense Refill  . aspirin 81 MG EC tablet Take 81 mg by mouth daily.      Marland Kitchen atorvastatin (LIPITOR) 40 MG tablet TAKE 1 TABLET BY MOUTH DAILY. 90 tablet 1  . levothyroxine (SYNTHROID, LEVOTHROID) 150 MCG tablet Take 1 tablet (150 mcg total) by mouth daily before breakfast. 90 tablet 1  . mometasone-formoterol (DULERA) 100-5 MCG/ACT AERO Inhale 2 puffs into the lungs 2 (two) times daily. 13 g 11  . tamsulosin (FLOMAX) 0.4 MG CAPS capsule TAKE 1 CAPSULE (0.4 MG TOTAL) BY MOUTH DAILY AFTER SUPPER. 90 capsule 1  . HYDROcodone-acetaminophen (NORCO) 10-325 MG tablet Take 1 tablet by mouth every 6 (six) hours as needed. 60 tablet 0  . Probiotic Product (ALIGN) 4 MG CAPS Take 1 capsule by mouth daily.     Marland Kitchen amLODipine (NORVASC) 10 MG tablet TAKE  1 TABLET BY MOUTH DAILY 90 tablet 3  . losartan (COZAAR) 100 MG tablet TAKE 1 TABLET BY MOUTH DAILY 145 tablet 1  . Multiple Vitamins-Minerals (GNP MEGA MULTI FOR MEN) TABS Take 1 tablet by mouth daily.     No facility-administered medications prior to visit.     ROS Review of Systems  Constitutional: Negative for chills, fatigue, fever and unexpected weight change.  Eyes: Negative.   Respiratory: Positive for cough, shortness of breath and wheezing. Negative for apnea, choking, chest tightness and stridor.   Cardiovascular: Positive for palpitations. Negative for chest pain and leg swelling.  Gastrointestinal: Negative for abdominal pain, blood in stool, constipation, diarrhea, nausea and vomiting.  Endocrine: Negative.  Negative for cold intolerance and heat intolerance.  Genitourinary: Negative.  Negative for dysuria.  Musculoskeletal: Positive for neck pain and neck stiffness. Negative for arthralgias and back pain.  Skin: Negative.  Negative for color change and rash.  Allergic/Immunologic: Negative.   Neurological: Positive for dizziness. Negative for tremors, weakness, light-headedness and numbness.  Hematological: Negative for adenopathy. Does not bruise/bleed easily.  Psychiatric/Behavioral: Negative.     Objective:  BP (!) 160/80 (BP Location: Left Arm, Patient Position: Sitting, Cuff Size: Large)   Pulse (!) 41   Temp 98 F (36.7 C) (Oral)   Resp 16   Ht 5\' 10"  (1.778 m)   Wt 214 lb 7 oz (97.3 kg)   SpO2  96%   BMI 30.77 kg/m   BP Readings from Last 3 Encounters:  09/27/16 (!) 160/80  08/29/16 (!) 166/88  07/28/16 (!) 152/77    Wt Readings from Last 3 Encounters:  09/27/16 214 lb 7 oz (97.3 kg)  06/16/16 217 lb (98.4 kg)  03/08/16 215 lb (97.5 kg)    Physical Exam  Constitutional: He is oriented to person, place, and time. No distress.  HENT:  Mouth/Throat: Oropharynx is clear and moist. No oropharyngeal exudate.  Eyes: Conjunctivae are normal. Right eye  exhibits no discharge. Left eye exhibits no discharge. No scleral icterus.  Neck: Normal range of motion. Neck supple. No JVD present. No tracheal deviation present. No thyromegaly present.  Cardiovascular: Normal rate, regular rhythm, normal heart sounds and intact distal pulses.  Frequent extrasystoles are present. Exam reveals no gallop and no friction rub.   No murmur heard. Pulmonary/Chest: Effort normal. No accessory muscle usage or stridor. No tachypnea. No respiratory distress. He has no decreased breath sounds. He has no wheezes. He has rhonchi in the right middle field and the left middle field. He has no rales. He exhibits no tenderness.  Abdominal: Soft. Bowel sounds are normal. He exhibits no distension. There is no tenderness. There is no rebound and no guarding.  Musculoskeletal: Normal range of motion. He exhibits no edema or tenderness.  Lymphadenopathy:    He has no cervical adenopathy.  Neurological: He is oriented to person, place, and time.  Skin: Skin is warm and dry. No rash noted. He is not diaphoretic. No erythema. No pallor.  Psychiatric: He has a normal mood and affect. His behavior is normal. Judgment and thought content normal.  Vitals reviewed.   Lab Results  Component Value Date   WBC 13.3 (H) 09/27/2016   HGB 13.0 09/27/2016   HCT 39.1 09/27/2016   PLT 304.0 09/27/2016   GLUCOSE 99 09/27/2016   CHOL 149 09/27/2016   TRIG 188.0 (H) 09/27/2016   HDL 58.60 09/27/2016   LDLDIRECT 75.0 03/08/2016   LDLCALC 53 09/27/2016   ALT 14 09/27/2016   AST 10 09/27/2016   NA 139 09/27/2016   K 3.9 09/27/2016   CL 101 09/27/2016   CREATININE 1.30 09/27/2016   BUN 19 09/27/2016   CO2 30 09/27/2016   TSH 0.80 09/27/2016   PSA 4.40 (H) 03/08/2016   INR 0.9 10/17/2007   HGBA1C 6.1 04/02/2013    Dg Inject Diag/thera/inc Needle/cath/plc Epi/cerv/thor W/img  Result Date: 08/29/2016 CLINICAL DATA:  Right upper extremity radiculitis. Cervical spondylosis and disc  osteophyte complex at C5-6 and C6-7. Osseous foraminal narrowing right greater than left at C6-7. FLUOROSCOPY TIME:  Radiation Exposure Index (as provided by the fluoroscopic device): 24.53 uGy*m2 Fluoroscopy Time:  24 seconds Number of Acquired Images:  0 PROCEDURE: CERVICAL EPIDURAL INJECTION An interlaminar approach was performed on the right at C7-T1. A 20 gauge epidural needle was advanced using loss-of-resistance technique. DIAGNOSTIC EPIDURAL INJECTION Injection of Isovue-M 300 shows a good epidural pattern with spread above and below the level of needle placement, primarily on the right. No vascular opacification is seen. THERAPEUTIC EPIDURAL INJECTION 1.5 ml of Kenalog 40 mixed with 1 ml of 1% Lidocaine and 2 ml of normal saline were then instilled. The procedure was well-tolerated, and the patient was discharged thirty minutes following the injection in good condition. IMPRESSION: Technically successful first epidural injection on the right at C7-T1. Electronically Signed   By: San Morelle M.D.   On: 08/29/2016 09:52  Assessment & Plan:   Dauson was seen today for palpitations, hypertension, hypothyroidism and hyperlipidemia.  Diagnoses and all orders for this visit:  Essential hypertension, benign- his blood pressure is well controlled, lites and renal function are stable. -     CBC with Differential/Platelet; Future -     Comprehensive metabolic panel; Future -     Magnesium; Future  Hypothyroidism, unspecified type- his TSH is in the normal range, will remain on the current dose of levothyroxine -     TSH; Future  Hyperlipidemia with target LDL less than 100- he is achieved his LDL goal is doing well on the statin. -     Comprehensive metabolic panel; Future -     Lipid panel; Future  Gastroesophageal reflux disease without esophagitis -     CBC with Differential/Platelet; Future  Irregular heart beat- his EKG shows frequent PVCs and he is symptomatic, the only thing  remarkable on his lab work is an elevated d-dimer so I have ordered a CT angio to screen for pulmonary embolus, he has a history of cardiomegaly so I've also asked him to follow-up with cardiology. -     EKG 12-Lead -     Troponin I; Future -     D-dimer, quantitative (not at [facility]); Future -     Ambulatory referral to Cardiology  Frequent PVCs- as above -     Troponin I; Future -     D-dimer, quantitative (not at [facility]); Future -     Ambulatory referral to Cardiology -     Magnesium; Future  Spinal stenosis in cervical region -     HYDROcodone-acetaminophen (NORCO) 10-325 MG tablet; Take 1 tablet by mouth every 6 (six) hours as needed.  Right cervical radiculopathy -     HYDROcodone-acetaminophen (NORCO) 10-325 MG tablet; Take 1 tablet by mouth every 6 (six) hours as needed.  Elevated LFTs -     Hepatitis C antibody; Future  Mild intermittent asthma with acute exacerbation -     promethazine-dextromethorphan (PROMETHAZINE-DM) 6.25-15 MG/5ML syrup; Take 5 mLs by mouth 4 (four) times daily as needed for cough.  Need for prophylactic measure -     Flu vaccine HIGH DOSE PF (Fluzone High dose)  D-dimer, elevated -     CT ANGIO CHEST PE W OR WO CONTRAST; Future  Other orders -     amLODipine (NORVASC) 10 MG tablet; Take 1 tablet (10 mg total) by mouth daily. -     losartan (COZAAR) 100 MG tablet; Take 1 tablet (100 mg total) by mouth daily.   I have discontinued Mr. Heriberto Mole and GNP MEGA MULTI FOR MEN. I have also changed his amLODipine and losartan. Additionally, I am having him start on promethazine-dextromethorphan. Lastly, I am having him maintain his aspirin, mometasone-formoterol, levothyroxine, atorvastatin, tamsulosin, and HYDROcodone-acetaminophen.  Meds ordered this encounter  Medications  . amLODipine (NORVASC) 10 MG tablet    Sig: Take 1 tablet (10 mg total) by mouth daily.    Dispense:  90 tablet    Refill:  3  . losartan (COZAAR) 100 MG tablet    Sig: Take 1  tablet (100 mg total) by mouth daily.    Dispense:  145 tablet    Refill:  1  . HYDROcodone-acetaminophen (NORCO) 10-325 MG tablet    Sig: Take 1 tablet by mouth every 6 (six) hours as needed.    Dispense:  75 tablet    Refill:  0  . promethazine-dextromethorphan (PROMETHAZINE-DM) 6.25-15 MG/5ML syrup  Sig: Take 5 mLs by mouth 4 (four) times daily as needed for cough.    Dispense:  118 mL    Refill:  0     Follow-up: Return in about 4 weeks (around 10/25/2016).  Scarlette Calico, MD

## 2016-09-28 ENCOUNTER — Encounter: Payer: Self-pay | Admitting: Internal Medicine

## 2016-09-28 ENCOUNTER — Telehealth: Payer: Self-pay | Admitting: Internal Medicine

## 2016-09-28 ENCOUNTER — Ambulatory Visit (INDEPENDENT_AMBULATORY_CARE_PROVIDER_SITE_OTHER)
Admission: RE | Admit: 2016-09-28 | Discharge: 2016-09-28 | Disposition: A | Discharge status: Home / Self Care | Payer: MEDICARE | Source: Ambulatory Visit | Attending: Internal Medicine | Admitting: Internal Medicine

## 2016-09-28 DIAGNOSIS — R7989 Other specified abnormal findings of blood chemistry: Secondary | ICD-10-CM | POA: Insufficient documentation

## 2016-09-28 DIAGNOSIS — R0602 Shortness of breath: Secondary | ICD-10-CM | POA: Diagnosis not present

## 2016-09-28 LAB — HEPATITIS C ANTIBODY: HCV Ab: NEGATIVE

## 2016-09-28 MED ORDER — IOPAMIDOL (ISOVUE-370) INJECTION 76%
80.0000 mL | Freq: Once | INTRAVENOUS | Status: AC | PRN
  Administered 2016-09-28: 80 mL via INTRAVENOUS

## 2016-09-28 NOTE — Telephone Encounter (Signed)
Pt is scheduled for CT at the [facility] [address] 3rd floor At 1:30. He needs to be there by 1:15 and no solid foods after 11:30. If he needs to change it he can call them at [phone]. thanks

## 2016-09-28 NOTE — Telephone Encounter (Signed)
Pt informed of the D-dimer results.

## 2016-10-03 MED FILL — AMLODIPINE BESYLATE 10 MG T: 10 | 90 days supply | Qty: 90 | Fill #0

## 2016-10-03 MED FILL — LOSARTAN POTASSIUM 100 MG T: 100 | 90 days supply | Qty: 90 | Fill #0

## 2016-10-04 ENCOUNTER — Encounter: Payer: Self-pay | Admitting: Cardiology

## 2016-10-10 ENCOUNTER — Encounter: Payer: Self-pay | Admitting: Cardiology

## 2016-10-10 ENCOUNTER — Ambulatory Visit (INDEPENDENT_AMBULATORY_CARE_PROVIDER_SITE_OTHER): Payer: MEDICARE | Admitting: Cardiology

## 2016-10-10 VITALS — BP 140/86 | HR 52 | Ht 69.0 in | Wt 211.4 lb

## 2016-10-10 DIAGNOSIS — I493 Ventricular premature depolarization: Secondary | ICD-10-CM | POA: Diagnosis not present

## 2016-10-10 DIAGNOSIS — I517 Cardiomegaly: Secondary | ICD-10-CM | POA: Diagnosis not present

## 2016-10-10 DIAGNOSIS — I10 Essential (primary) hypertension: Secondary | ICD-10-CM

## 2016-10-10 DIAGNOSIS — R002 Palpitations: Secondary | ICD-10-CM

## 2016-10-10 MED FILL — LEVOTHYROXINE 150 MCG TAB: 150 | 30 days supply | Qty: 30 | Fill #4

## 2016-10-10 NOTE — Progress Notes (Addendum)
PCP: Ronald Calico, MD  Clinic Note: Chief Complaint  Patient presents with  . New Patient (Initial Visit)    refferal from [facility]    HPI: Ronald Bond is a 70 y.o. male with a PMH below who presents today for Cardiology consultation  Asthma exacerbation while up in [place].Ronald Bond was seen by his PCP on October 31Follow-up visit asthma exacerbation  Recent Hospitalizations: None  Studies Reviewed:   CTA Chest - NO PE. Thoracic Aorta Atherosclerosis.  Myoview August 2014: LOW. No ischemia. EF suggested to be 47% with no wall motion abnormality?? Probably underestimation  Interval History: Ronald Bond presents today for evaluation of irregular heartbeat with fluttering fluctuating heartbeats that lasts several minutes a time. They occur at various times the day without any rhyme or reason. Occasionally the fluttering gets the point where he may actually lose his balance, feel as though he may pass out, but has not actually passed out. His coughing or wheezing and shortness of breath is notably improved since treatment of his asthma attack. He has these palpitations, but has not been overly concerned about them. He does not necessarily noted any chest tightness or pressure associated with them or all with rest or exertion. He has not yet gotten back to his routine level of activity and doesn't routinely exercise anyway.  He does note a little bit of exertional dyspnea but that is getting better as his asthma has gotten he denies any PND, orthopnea or edema. No claudication. No syncope or TIA/amaurosis fugax.    PAD Screen 10/10/2016  Previous PAD dx? No  Previous surgical procedure? No  Pain with walking? No  Feet/toe relief with dangling? Yes  Painful, non-healing ulcers? No  Extremities discolored? No    ROS: A comprehensive was performed. Review of Systems  Constitutional: Positive for malaise/fatigue (Energy improving as is recovering from his  asthma exacerbation). Negative for chills and fever.  HENT: Negative for congestion, hearing loss, nosebleeds and sinus pain.   Eyes: Negative for blurred vision.  Respiratory: Positive for shortness of breath (With asthma. But not with exertion.).   Cardiovascular:       Per history of present illness  Gastrointestinal: Negative for abdominal pain, blood in stool, heartburn and melena.  Genitourinary: Negative for hematuria.  Musculoskeletal: Negative for joint pain and myalgias.  Neurological: Positive for dizziness (A little bit dizzy when he has the fluttering heart rates). Negative for loss of consciousness.  Psychiatric/Behavioral: Negative for depression and memory loss. The patient is not nervous/anxious and does not have insomnia.   All other systems reviewed and are negative.   Past Medical History:  Diagnosis Date  . GERD (gastroesophageal reflux disease)   . HTN (hypertension)   . Hyperlipidemia   . Hypothyroidism   . Osteoarthritis   . Wears glasses     Past Surgical History:  Procedure Laterality Date  . COLONOSCOPY    . EYE SURGERY     both cataracts  . 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 THYROIDECTOMY  1974  . TONSILLECTOMY    . TOTAL KNEE ARTHROPLASTY  2009   left   Current Meds  Medication Sig  . amLODipine (NORVASC) 10 MG tablet Take 1 tablet (10 mg total) by mouth daily.  Marland Kitchen aspirin 81 MG EC tablet Take 81 mg by mouth daily.    Marland Kitchen  atorvastatin (LIPITOR) 40 MG tablet TAKE 1 TABLET BY MOUTH DAILY.  Marland Kitchen HYDROcodone-acetaminophen (NORCO) 10-325 MG tablet Take 1 tablet by mouth every 6 (six) hours as needed.  Marland Kitchen levothyroxine (SYNTHROID, LEVOTHROID) 150 MCG tablet Take 1 tablet (150 mcg total) by mouth daily before breakfast.  . losartan (COZAAR) 100 MG tablet Take 1 tablet (100 mg total) by mouth  daily.  . mometasone-formoterol (DULERA) 100-5 MCG/ACT AERO Inhale 2 puffs into the lungs 2 (two) times daily.  . promethazine-dextromethorphan (PROMETHAZINE-DM) 6.25-15 MG/5ML syrup Take 5 mLs by mouth 4 (four) times daily as needed for cough.  . tamsulosin (FLOMAX) 0.4 MG CAPS capsule TAKE 1 CAPSULE (0.4 MG TOTAL) BY MOUTH DAILY AFTER SUPPER.   Allergies  Allergen Reactions  . Lisinopril Other (See Comments)    edema    Social History   Social History  . Marital status: Married    Spouse name: N/A  . Number of children: N/A  . Years of education: N/A   Occupational History  . Retired Disabled   Social History Main Topics  . Smoking status: Never Smoker  . Smokeless tobacco: Never Used  . Alcohol use No  . Drug use: No  . Sexual activity: Yes   Other Topics Concern  . None   Social History Narrative   Regular Exercise -  YES          Family History  Problem Relation Age of Onset  . Brain cancer Mother   . Heart disease Father   . Heart disease Brother      Wt Readings from Last 3 Encounters:  10/10/16 95.9 kg (211 lb 6.4 oz)  09/27/16 97.3 kg (214 lb 7 oz)  06/16/16 98.4 kg (217 lb)    PHYSICAL EXAM BP 140/86 (BP Location: Left Arm, Cuff Size: Normal)   Pulse (!) 52   Ht 5\' 9"  (1.753 m)   Wt 95.9 kg (211 lb 6.4 oz)   BMI 31.22 kg/m  General appearance: alert, cooperative, appears stated age, no distress and mildly obese. Well nourished &groomed. HEENT: [place]/AT, EOMI, MMM, anicteric sclera Neck: no adenopathy, no carotid bruit and no JVD Lungs: clear to auscultation bilaterally except faint expiratory wheezing (with forced expiration), normal percussion bilaterally and non-labored Heart: regular rate and rhythm, S1, S2 normal, no murmur, click, rub or gallop; non-displaced PMI Abdomen: soft, non-tender; bowel sounds normal; no masses,  no organomegaly; no HJR Extremities: extremities normal, atraumatic, no cyanosis, or edema Pulses: 2+ and  symmetric; Skin: mobility and turgor normal, no evidence of bleeding or bruising and no lesions noted  Neurologic: Mental status: Alert, oriented, thought content appropriate Cranial nerves: normal (II-XII grossly intact)    Adult ECG Report  Rate: 52 ;  Rhythm: sinus bradycardia, sinus arrhythmia and otherwise normal axis, intervals & durations;   Narrative Interpretation: otherwise normal EKG.   Other studies Reviewed: Additional studies/ records that were reviewed today include:  Recent Labs:     Chemistry      Component Value Date/Time   NA 139 09/27/2016 0905   K 3.9 09/27/2016 0905   CL 101 09/27/2016 0905   CO2 30 09/27/2016 0905   BUN 19 09/27/2016 0905   CREATININE 1.30 09/27/2016 0905      Component Value Date/Time   CALCIUM 9.2 09/27/2016 0905   ALKPHOS 63 09/27/2016 0905   AST 10 09/27/2016 0905   ALT 14 09/27/2016 0905   BILITOT 0.4 09/27/2016 0905      Lab Results  Component Value  Date   CHOL 149 09/27/2016   HDL 58.60 09/27/2016   LDLCALC 53 09/27/2016   LDLDIRECT 75.0 03/08/2016   TRIG 188.0 (H) 09/27/2016   CHOLHDL 3 09/27/2016     ASSESSMENT / PLAN: Problem List Items Addressed This Visit    Essential hypertension, benign (Chronic)    Borderline blood pressure control currently on amlodipine, losartan. Next step would probably be to consider beta blocker 67 palpitations. Would like to know what I am treating first, however.      Cardiomegaly    Essentially normal Myoview in 2014.  No signs of heart failure. Depending on what we see on the monitor, may consider echocardiogram.       Frequent PVCs    Previously diagnosed with PVCs. Would like to see if now with these fast heart fluttering sensations if he is truly having arrhythmia or just PVCs.      Relevant Orders   EKG 12-Lead (Completed)   Cardiac event monitor   Palpitations - Primary    He seems to be having significant symptoms of fluctuation fluttering heartbeat that would suggest  a possible arrhythmia. Otherwise it would be simply PVCs. If he does a lot of PVCs would be inclined to check an echocardiogram.  Plan: 30 day event monitor (once he returns from his planned trip)      Relevant Orders   EKG 12-Lead (Completed)   Cardiac event monitor      Current medicines are reviewed at length with the patient today. (+/- concerns) n/a The following changes have been made:   Patient Instructions  SCHEDULE AT [facility] has recommended that you wear an event monitor WEAR FOR 2 WEEKS. Event monitors are medical devices that record the heart's electrical activity. Doctors most often [place] these monitors to diagnose arrhythmias. Arrhythmias are problems with the speed or rhythm of the heartbeat. The monitor is a small, portable device. You can wear one while you do your normal daily activities. This is usually used to diagnose what is causing palpitations/syncope (passing out).    NO OTHER CHANGES    Your physician recommends that you schedule a follow-up appointment in: 6 -7 WEEKS WITH DR Noreene Boreman F/U MONITOR RESULTS Studies Ordered:   Orders Placed This Encounter  Procedures  . Cardiac event monitor  . EKG 12-Lead      Glenetta Hew, M.D., M.S. Interventional Cardiologist   Pager # [phone] Phone # [phone] [address] [place], [place] [address]

## 2016-10-10 NOTE — Patient Instructions (Addendum)
SCHEDULE AT [facility] has recommended that you wear an event monitor WEAR FOR 2 WEEKS. Event monitors are medical devices that record the heart's electrical activity. Doctors most often [place] these monitors to diagnose arrhythmias. Arrhythmias are problems with the speed or rhythm of the heartbeat. The monitor is a small, portable device. You can wear one while you do your normal daily activities. This is usually used to diagnose what is causing palpitations/syncope (passing out).    NO OTHER CHANGES    Your physician recommends that you schedule a follow-up appointment in: 6 -[place] F/U MONITOR RESULTS

## 2016-10-11 ENCOUNTER — Encounter: Payer: Self-pay | Admitting: Cardiology

## 2016-10-11 NOTE — Assessment & Plan Note (Signed)
Previously diagnosed with PVCs. Would like to see if now with these fast heart fluttering sensations if he is truly having arrhythmia or just PVCs.

## 2016-10-11 NOTE — Assessment & Plan Note (Signed)
He seems to be having significant symptoms of fluctuation fluttering heartbeat that would suggest a possible arrhythmia. Otherwise it would be simply PVCs. If he does a lot of PVCs would be inclined to check an echocardiogram.  Plan: 30 day event monitor (once he returns from his planned trip)

## 2016-10-11 NOTE — Assessment & Plan Note (Signed)
Essentially normal Myoview in 2014.  No signs of heart failure. Depending on what we see on the monitor, may consider echocardiogram.

## 2016-10-11 NOTE — Assessment & Plan Note (Signed)
Borderline blood pressure control currently on amlodipine, losartan. Next step would probably be to consider beta blocker 67 palpitations. Would like to know what I am treating first, however.

## 2016-10-13 ENCOUNTER — Encounter: Payer: Self-pay | Admitting: *Deleted

## 2016-10-19 ENCOUNTER — Telehealth: Payer: Self-pay

## 2016-10-19 MED ORDER — AMLODIPINE BESYLATE 10 MG PO TABS: 10.0000 mg | ORAL_TABLET | Freq: Every day | ORAL | 3 refills | Status: DC

## 2016-10-19 MED ORDER — LEVOTHYROXINE SODIUM 150 MCG PO TABS: 150.0000 ug | ORAL_TABLET | Freq: Every day | ORAL | 1 refills | Status: DC

## 2016-10-19 NOTE — Telephone Encounter (Signed)
[facility] sent a fax to request new rx for levothyroxine and amlodipine.   Erx sent as requested.

## 2016-10-24 ENCOUNTER — Ambulatory Visit (INDEPENDENT_AMBULATORY_CARE_PROVIDER_SITE_OTHER): Payer: MEDICARE

## 2016-10-24 DIAGNOSIS — R002 Palpitations: Secondary | ICD-10-CM | POA: Diagnosis not present

## 2016-10-24 DIAGNOSIS — I493 Ventricular premature depolarization: Secondary | ICD-10-CM | POA: Diagnosis not present

## 2016-10-24 MED FILL — ATORVASTATIN 40 MG TABLET: 40 | 30 days supply | Qty: 30 | Fill #2

## 2016-11-14 MED FILL — LEVOTHYROXINE 150 MCG TAB: 150 | 30 days supply | Qty: 30 | Fill #0

## 2016-11-15 NOTE — Progress Notes (Signed)
2 Week Even Monitor:  Pretty much normal monitor. Showed mostly normal rhythm and borderline low rhythm. No arrhythmias noted.  No manual triggers to suggest any symptoms. Automatic triggers identified PACs (premature atrial contractions and PVCs (premature ventricular contractions) as well as atrial bigeminy (every other beat PAC). All of these are relatively benign.  Glenetta Hew, MD

## 2016-11-16 ENCOUNTER — Ambulatory Visit: Payer: MEDICARE | Admitting: Cardiology

## 2016-11-18 ENCOUNTER — Telehealth: Payer: Self-pay | Admitting: Cardiology

## 2016-11-18 NOTE — Telephone Encounter (Signed)
Spoke to patient. Result given . Verbalized understanding APPOINTMENT RESCHEDULE 12/23/16 9:30 AM

## 2016-11-18 NOTE — Telephone Encounter (Signed)
Follow Up   Calling back to follow up on test results. Patient states he had monitor.

## 2016-11-24 NOTE — Progress Notes (Addendum)
Subjective:   Ronald Bond is a 70 y.o. male who presents for Medicare Annual/Subsequent preventive examination.  The Patient was informed that the wellness visit is to identify future health risk and educate and initiate measures that can reduce risk for increased disease through the lifespan.   Describes health as fair, good or great? "good" Works part time Runner, broadcasting/film/video.   Review of Systems:  No ROS.  Medicare Wellness Visit.  Cardiac Risk Factors include: advanced age (>34men, >43 women);dyslipidemia;hypertension;male gender;obesity (BMI >30kg/m2);family history of premature cardiovascular disease   Sleep patterns: Sleeps 6 hours, up to void x 4 times. Feels rested, takes nap during day.  Home Safety/Smoke Alarms:  Smoke detectors and security in place.  Living environment; residence and Firearm Safety: Lives with wife and daughter and granddaughter (41yo) in two story, does not access upstairs. Firearms locked away.  Seat Belt Safety/Bike Helmet: wears seat belt.    Counseling:   Eye Exam- Last exam 2016, yearly by [facility] exam about 10 years ago. Will make appointment.    Male:   CCS-colonoscopy 07/11/2007, normal. Recall 10 years.  PSA-03/08/2016, 4.40. Followed by [facility].      Objective:    Vitals: BP (!) 148/80 (BP Location: Right Arm, Patient Position: Sitting, Cuff Size: Normal)   Pulse (!) 58   Ht 5\' 9"  (1.753 m)   Wt 220 lb 1.9 oz (99.8 kg) Comment: with coat and shoes  SpO2 99%   BMI 32.51 kg/m   Body mass index is 32.51 kg/m.  Tobacco History  Smoking Status  . Never Smoker  Smokeless Tobacco  . Never Used     Counseling given: Not Answered   Past Medical History:  Diagnosis Date  . GERD (gastroesophageal reflux disease)   . HTN (hypertension)   . Hyperlipidemia   . Hypothyroidism   . Osteoarthritis   . Wears glasses    Past Surgical History:  Procedure Laterality Date  . COLONOSCOPY    . EYE SURGERY     both  cataracts  . 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 THYROIDECTOMY  1974  . TONSILLECTOMY    . TOTAL KNEE ARTHROPLASTY  2009   left   Family History  Problem Relation Age of Onset  . Brain cancer Mother   . Heart disease Father   . Heart disease Brother    History  Sexual Activity  . Sexual activity: Yes    Outpatient Encounter Prescriptions as of 11/29/2016  Medication Sig  . amLODipine (NORVASC) 10 MG tablet Take 1 tablet (10 mg total) by mouth daily.  Marland Kitchen aspirin 81 MG EC tablet Take 81 mg by mouth daily.    Marland Kitchen atorvastatin (LIPITOR) 40 MG tablet TAKE 1 TABLET BY MOUTH DAILY.  Marland Kitchen HYDROcodone-acetaminophen (NORCO) 10-325 MG tablet Take 1 tablet by mouth every 6 (six) hours as needed.  Marland Kitchen levothyroxine (SYNTHROID, LEVOTHROID) 150 MCG tablet Take 1 tablet (150 mcg total) by mouth daily before breakfast.  . losartan (COZAAR) 100 MG tablet Take 1 tablet (100 mg total) by mouth daily.  . mometasone-formoterol (DULERA) 100-5 MCG/ACT AERO Inhale 2 puffs into the lungs 2 (two) times daily.  . Multiple Vitamins-Minerals (MULTIVITAMIN ADULTS 50+ PO) Take by mouth.  . promethazine-dextromethorphan (PROMETHAZINE-DM) 6.25-15 MG/5ML syrup Take 5 mLs by mouth 4 (four) times daily as needed for cough.  Marland Kitchen  tamsulosin (FLOMAX) 0.4 MG CAPS capsule TAKE 1 CAPSULE (0.4 MG TOTAL) BY MOUTH DAILY AFTER SUPPER.   No facility-administered encounter medications on file as of 11/29/2016.     Activities of Daily Living In your present state of health, do you have any difficulty performing the following activities: 11/29/2016  Hearing? N  Vision? N  Difficulty concentrating or making decisions? N  Walking or climbing stairs? N  Dressing or bathing? N  Doing errands, shopping? N  Preparing Food and eating ? N  Using the Toilet? N  In the past six  months, have you accidently leaked urine? N  Do you have problems with loss of bowel control? N  Managing your Medications? N  Managing your Finances? N  Housekeeping or managing your Housekeeping? N  Some recent data might be hidden    Patient Care Team: Janith Lima, MD as PCP - General Leonie Man, MD as Consulting Physician (Cardiology) Franchot Gallo, MD as Consulting Physician (Urology) Deneise Lever, MD as Consulting Physician (Pulmonary Disease) groat (Dentistry)   Assessment:    Physical assessment deferred to PCP.  Exercise Activities and Dietary recommendations Current Exercise Habits: The patient does not participate in regular exercise at present (stays active throughout day. ), Exercise limited by: None identified Diet (meal preparation, eat out, water intake, caffeinated beverages, dairy products, fruits and vegetables):   Breakfast: toast, oatmeal, grits, eggs, coffee (decaf, 1 cup) Lunch: sub sandwich, snacks on fruit, chips, cookies.  Dinner: meat, vegetables. Rarely fried.   Discussed heart healthy diet and increasing exercise.  Goals    . Weight (lb) < 200 lb (90.7 kg)          Would like to lose 20 pounds by increasing activity and making healthy food choices.       Fall Risk Fall Risk  11/29/2016 04/02/2015 04/25/2013 04/24/2013 04/04/2013  Falls in the past year? No No No No No   Depression Screen PHQ 2/9 Scores 11/29/2016 04/02/2015 04/25/2013 04/24/2013  PHQ - 2 Score 0 0 0 0    Cognitive Function       Ad8 score reviewed for issues:  Issues making decisions: no  Less interest in hobbies / activities:no  Repeats questions, stories (family complaining):no  Trouble using ordinary gadgets (microwave, computer, phone):no  Forgets the month or year: no  Mismanaging finances: no  Remembering appts:no  Daily problems with thinking and/or memory:no Ad8 score is=0     Immunization History  Administered Date(s) Administered  .  Influenza Split 10/24/2011  . Influenza Whole 08/19/2009, 07/23/2010  . Influenza, High Dose Seasonal PF 10/21/2013, 09/27/2016  . Influenza,inj,Quad PF,36+ Mos 08/07/2014, 08/25/2015  . Pneumococcal Conjugate-13 08/25/2015  . Pneumococcal Polysaccharide-23 11/28/2004, 03/08/2016  . Td 11/28/2004  . Tdap 08/25/2015  . Zoster 02/24/2011   Screening Tests Health Maintenance  Topic Date Due  . COLONOSCOPY  07/10/2017  . TETANUS/TDAP  08/24/2025  . INFLUENZA VACCINE  Completed  . ZOSTAVAX  Completed  . Hepatitis C Screening  Completed  . PNA vac Low Risk Adult  Completed      Plan:     Eat heart healthy diet (full of fruits, vegetables, whole grains, lean protein, water--limit salt, fat, and sugar intake) and increase physical activity as tolerated.  Continue doing brain stimulating activities (puzzles, reading, adult coloring books, staying active) to keep memory sharp.   Bring a copy of your advance directives to your next office visit.   During the course of the visit the  patient was educated and counseled about the following appropriate screening and preventive services:   Vaccines to include Pneumoccal, Influenza, Hepatitis B, Td, Zostavax, HCV  Cardiovascular Disease  Colorectal cancer screening  Diabetes screening  Prostate Cancer Screening  Glaucoma screening  Nutrition counseling   Patient Instructions (the written plan) was given to the patient.   Medical screening examination/treatment/procedure(s) were performed by non-physician practitioner and as supervising physician I was immediately available for consultation/collaboration. I agree with above. Scarlette Calico, MD   Gerilyn Nestle, RN  11/29/2016

## 2016-11-24 NOTE — Progress Notes (Signed)
Pre visit review using our clinic review tool, if applicable. No additional management support is needed unless otherwise documented below in the visit note. 

## 2016-11-29 ENCOUNTER — Ambulatory Visit (INDEPENDENT_AMBULATORY_CARE_PROVIDER_SITE_OTHER): Payer: MEDICARE

## 2016-11-29 VITALS — BP 148/80 | HR 58 | Ht 69.0 in | Wt 220.1 lb

## 2016-11-29 DIAGNOSIS — Z0000 Encounter for general adult medical examination without abnormal findings: Secondary | ICD-10-CM

## 2016-11-29 NOTE — Patient Instructions (Addendum)
Eat heart healthy diet (full of fruits, vegetables, whole grains, lean protein, water--limit salt, fat, and sugar intake) and increase physical activity as tolerated.  Continue doing brain stimulating activities (puzzles, reading, adult coloring books, staying active) to keep memory sharp.   Bring a copy of your advance directives to your next office visit.  Fall Prevention in the Home Introduction Falls can cause injuries. They can happen to people of all ages. There are many things you can do to make your home safe and to help prevent falls. What can I do on the outside of my home?  Regularly fix the edges of walkways and driveways and fix any cracks.  Remove anything that might make you trip as you walk through a door, such as a raised step or threshold.  Trim any bushes or trees on the path to your home.  Use bright outdoor lighting.  Clear any walking paths of anything that might make someone trip, such as rocks or tools.  Regularly check to see if handrails are loose or broken. Make sure that both sides of any steps have handrails.  Any raised decks and porches should have guardrails on the edges.  Have any leaves, snow, or ice cleared regularly.  Use sand or salt on walking paths during winter.  Clean up any spills in your garage right away. This includes oil or grease spills. What can I do in the bathroom?  Use night lights.  Install grab bars by the toilet and in the tub and shower. Do not use towel bars as grab bars.  Use non-skid mats or decals in the tub or shower.  If you need to sit down in the shower, use a plastic, non-slip stool.  Keep the floor dry. Clean up any water that spills on the floor as soon as it happens.  Remove soap buildup in the tub or shower regularly.  Attach bath mats securely with double-sided non-slip rug tape.  Do not have throw rugs and other things on the floor that can make you trip. What can I do in the bedroom?  Use night  lights.  Make sure that you have a light by your bed that is easy to reach.  Do not use any sheets or blankets that are too big for your bed. They should not hang down onto the floor.  Have a firm chair that has side arms. You can use this for support while you get dressed.  Do not have throw rugs and other things on the floor that can make you trip. What can I do in the kitchen?  Clean up any spills right away.  Avoid walking on wet floors.  Keep items that you use a lot in easy-to-reach places.  If you need to reach something above you, use a strong step stool that has a grab bar.  Keep electrical cords out of the way.  Do not use floor polish or wax that makes floors slippery. If you must use wax, use non-skid floor wax.  Do not have throw rugs and other things on the floor that can make you trip. What can I do with my stairs?  Do not leave any items on the stairs.  Make sure that there are handrails on both sides of the stairs and use them. Fix handrails that are broken or loose. Make sure that handrails are as long as the stairways.  Check any carpeting to make sure that it is firmly attached to the stairs. Fix any   any carpet that is loose or worn.  Avoid having throw rugs at the top or bottom of the stairs. If you do have throw rugs, attach them to the floor with carpet tape.  Make sure that you have a light switch at the top of the stairs and the bottom of the stairs. If you do not have them, ask someone to add them for you. What else can I do to help prevent falls?  Wear shoes that:  Do not have high heels.  Have rubber bottoms.  Are comfortable and fit you well.  Are closed at the toe. Do not wear sandals.  If you use a stepladder:  Make sure that it is fully opened. Do not climb a closed stepladder.  Make sure that both sides of the stepladder are locked into place.  Ask someone to hold it for you, if possible.  Clearly mark and make sure that you can  see:  Any grab bars or handrails.  First and last steps.  Where the edge of each step is.  Use tools that help you move around (mobility aids) if they are needed. These include:  Canes.  Walkers.  Scooters.  Crutches.  Turn on the lights when you go into a dark area. Replace any light bulbs as soon as they burn out.  Set up your furniture so you have a clear path. Avoid moving your furniture around.  If any of your floors are uneven, fix them.  If there are any pets around you, be aware of where they are.  Review your medicines with your doctor. Some medicines can make you feel dizzy. This can increase your chance of falling. Ask your doctor what other things that you can do to help prevent falls. This information is not intended to replace advice given to you by your health care provider. Make sure you discuss any questions you have with your health care provider. Document Released: 09/10/2009 Document Revised: 04/21/2016 Document Reviewed: 12/19/2014  2017 [employer]  Health Maintenance, Male A healthy lifestyle and preventative care can promote health and wellness.  Maintain regular health, dental, and eye exams.  Eat a healthy diet. Foods like vegetables, fruits, whole grains, low-fat dairy products, and lean protein foods contain the nutrients you need and are low in calories. Decrease your intake of foods high in solid fats, added sugars, and salt. Get information about a proper diet from your health care provider, if necessary.  Regular physical exercise is one of the most important things you can do for your health. Most adults should get at least 150 minutes of moderate-intensity exercise (any activity that increases your heart rate and causes you to sweat) each week. In addition, most adults need muscle-strengthening exercises on 2 or more days a week.   Maintain a healthy weight. The body mass index (BMI) is a screening tool to identify possible weight problems. It  provides an estimate of body fat based on height and weight. Your health care provider can find your BMI and can help you achieve or maintain a healthy weight. For males 20 years and older:  A BMI below 18.5 is considered underweight.  A BMI of 18.5 to 24.9 is normal.  A BMI of 25 to 29.9 is considered overweight.  A BMI of 30 and above is considered obese.  Maintain normal blood lipids and cholesterol by exercising and minimizing your intake of saturated fat. Eat a balanced diet with plenty of fruits and vegetables. Blood tests for lipids  and cholesterol should begin at age 20 and be repeated every 5 years. If your lipid or cholesterol levels are high, you are over age 50, or you are at high risk for heart disease, you may need your cholesterol levels checked more frequently.Ongoing high lipid and cholesterol levels should be treated with medicines if diet and exercise are not working.  If you smoke, find out from your health care provider how to quit. If you do not use tobacco, do not start.  Lung cancer screening is recommended for adults aged 55-80 years who are at high risk for developing lung cancer because of a history of smoking. A yearly low-dose CT scan of the lungs is recommended for people who have at least a 30-pack-year history of smoking and are current smokers or have quit within the past 15 years. A pack year of smoking is smoking an average of 1 pack of cigarettes a day for 1 year (for example, a 30-pack-year history of smoking could mean smoking 1 pack a day for 30 years or 2 packs a day for 15 years). Yearly screening should continue until the smoker has stopped smoking for at least 15 years. Yearly screening should be stopped for people who develop a health problem that would prevent them from having lung cancer treatment.  If you choose to drink alcohol, do not have more than 2 drinks per day. One drink is considered to be 12 oz (360 mL) of beer, 5 oz (150 mL) of wine, or 1.5  oz (45 mL) of liquor.  Avoid the use of street drugs. Do not share needles with anyone. Ask for help if you need support or instructions about stopping the use of drugs.  High blood pressure causes heart disease and increases the risk of stroke. High blood pressure is more likely to develop in:  People who have blood pressure in the end of the normal range (100-139/85-89 mm Hg).  People who are overweight or obese.  People who are African American.  If you are 18-39 years of age, have your blood pressure checked every 3-5 years. If you are 40 years of age or older, have your blood pressure checked every year. You should have your blood pressure measured twice-once when you are at a hospital or clinic, and once when you are not at a hospital or clinic. Record the average of the two measurements. To check your blood pressure when you are not at a hospital or clinic, you can use:  An automated blood pressure machine at a pharmacy.  A home blood pressure monitor.  If you are 45-79 years old, ask your health care provider if you should take aspirin to prevent heart disease.  Diabetes screening involves taking a blood sample to check your fasting blood sugar level. This should be done once every 3 years after age 45 if you are at a normal weight and without risk factors for diabetes. Testing should be considered at a younger age or be carried out more frequently if you are overweight and have at least 1 risk factor for diabetes.  Colorectal cancer can be detected and often prevented. Most routine colorectal cancer screening begins at the age of 50 and continues through age 75. However, your health care provider may recommend screening at an earlier age if you have risk factors for colon cancer. On a yearly basis, your health care provider may provide home test kits to check for hidden blood in the stool. A small camera at the   end of a tube may be used to directly examine the colon (sigmoidoscopy or  colonoscopy) to detect the earliest forms of colorectal cancer. Talk to your health care provider about this at age 76 when routine screening begins. A direct exam of the colon should be repeated every 5-10 years through age 27, unless early forms of precancerous polyps or small growths are found.  People who are at an increased risk for hepatitis B should be screened for this virus. You are considered at high risk for hepatitis B if:  You were born in a country where hepatitis B occurs often. Talk with your health care provider about which countries are considered high risk.  Your parents were born in a high-risk country and you have not received a shot to protect against hepatitis B (hepatitis B vaccine).  You have HIV or AIDS.  You use needles to inject street drugs.  You live with, or have sex with, someone who has hepatitis B.  You are a man who has sex with other men (MSM).  You get hemodialysis treatment.  You take certain medicines for conditions like cancer, organ transplantation, and autoimmune conditions.  Hepatitis C blood testing is recommended for all people born from [birth year] through 1965 and any individual with known risk factors for hepatitis C.  Healthy men should no longer receive prostate-specific antigen (PSA) blood tests as part of routine cancer screening. Talk to your health care provider about prostate cancer screening.  Testicular cancer screening is not recommended for adolescents or adult males who have no symptoms. Screening includes self-exam, a health care provider exam, and other screening tests. Consult with your health care provider about any symptoms you have or any concerns you have about testicular cancer.  Practice safe sex. Use condoms and avoid high-risk sexual practices to reduce the spread of sexually transmitted infections (STIs).  You should be screened for STIs, including gonorrhea and chlamydia if:  You are sexually active and are younger than  24 years.  You are older than 24 years, and your health care provider tells you that you are at risk for this type of infection.  Your sexual activity has changed since you were last screened, and you are at an increased risk for chlamydia or gonorrhea. Ask your health care provider if you are at risk.  If you are at risk of being infected with HIV, it is recommended that you take a prescription medicine daily to prevent HIV infection. This is called pre-exposure prophylaxis (PrEP). You are considered at risk if:  You are a man who has sex with other men (MSM).  You are a heterosexual man who is sexually active with multiple partners.  You take drugs by injection.  You are sexually active with a partner who has HIV.  Talk with your health care provider about whether you are at high risk of being infected with HIV. If you choose to begin PrEP, you should first be tested for HIV. You should then be tested every 3 months for as long as you are taking PrEP.  Use sunscreen. Apply sunscreen liberally and repeatedly throughout the day. You should seek shade when your shadow is shorter than you. Protect yourself by wearing long sleeves, pants, a wide-brimmed hat, and sunglasses year round whenever you are outdoors.  Tell your health care provider of new moles or changes in moles, especially if there is a change in shape or color. Also, tell your health care provider if a mole is larger  than the size of a pencil eraser.  A one-time screening for abdominal aortic aneurysm (AAA) and surgical repair of large AAAs by ultrasound is recommended for men aged 45-75 years who are current or former smokers.  Stay current with your vaccines (immunizations). This information is not intended to replace advice given to you by your health care provider. Make sure you discuss any questions you have with your health care provider. Document Released: 05/12/2008 Document Revised: 12/05/2014 Document Reviewed:  08/18/2015 Elsevier Interactive Patient Education  2017 [employer]   Fat and Cholesterol Restricted Diet High levels of fat and cholesterol in your blood may lead to various health problems, such as diseases of the heart, blood vessels, gallbladder, liver, and pancreas. Fats are concentrated sources of energy that come in various forms. Certain types of fat, including saturated fat, may be harmful in excess. Cholesterol is a substance needed by your body in small amounts. Your body makes all the cholesterol it needs. Excess cholesterol comes from the food you eat. When you have high levels of cholesterol and saturated fat in your blood, health problems can develop because the excess fat and cholesterol will gather along the walls of your blood vessels, causing them to narrow. Choosing the right foods will help you control your intake of fat and cholesterol. This will help keep the levels of these substances in your blood within normal limits and reduce your risk of disease. What is my plan? Your health care provider recommends that you:  Limit your fat intake to ______% or less of your total calories per day.  Limit the amount of cholesterol in your diet to less than _________mg per day.  Eat 20-30 grams of fiber each day. What types of fat should I choose?  Choose healthy fats more often. Choose monounsaturated and polyunsaturated fats, such as olive and canola oil, flaxseeds, walnuts, almonds, and seeds.  Eat more omega-3 fats. Good choices include salmon, mackerel, sardines, tuna, flaxseed oil, and ground flaxseeds. Aim to eat fish at least two times a week.  Limit saturated fats. Saturated fats are primarily found in animal products, such as meats, butter, and cream. Plant sources of saturated fats include palm oil, palm kernel oil, and coconut oil.  Avoid foods with partially hydrogenated oils in them. These contain trans fats. Examples of foods that contain trans fats are stick  margarine, some tub margarines, cookies, crackers, and other baked goods. What general guidelines do I need to follow? These guidelines for healthy eating will help you control your intake of fat and cholesterol:  Check food labels carefully to identify foods with trans fats or high amounts of saturated fat.  Fill one half of your plate with vegetables and green salads.  Fill one fourth of your plate with whole grains. Look for the word "whole" as the first word in the ingredient list.  Fill one fourth of your plate with lean protein foods.  Limit fruit to two servings a day. Choose fruit instead of juice.  Eat more foods that contain fiber, such as apples, broccoli, carrots, beans, peas, and barley.  Eat more home-cooked food and less restaurant, buffet, and fast food.  Limit or avoid alcohol.  Limit foods high in starch and sugar.  Limit fried foods.  Cook foods using methods other than frying. Baking, boiling, grilling, and broiling are all great options.  Lose weight if you are overweight. Losing just 5-10% of your initial body weight can help your overall health and prevent diseases  such as diabetes and heart disease. What foods can I eat? Grains  Whole grains, such as whole wheat or whole grain breads, crackers, cereals, and pasta. Unsweetened oatmeal, bulgur, barley, quinoa, or brown rice. Corn or whole wheat flour tortillas. Vegetables  Fresh or frozen vegetables (raw, steamed, roasted, or grilled). Green salads. Fruits  All fresh, canned (in natural juice), or frozen fruits. Meats and other protein foods  Ground beef (85% or leaner), grass-fed beef, or beef trimmed of fat. Skinless chicken or [place]. Ground chicken or [place]. Pork trimmed of fat. All fish and seafood. Eggs. Dried beans, peas, or lentils. Unsalted nuts or seeds. Unsalted canned or dry beans. Dairy  Low-fat dairy products, such as skim or 1% milk, 2% or reduced-fat cheeses, low-fat ricotta or cottage  cheese, or plain low-fat yo Fats and oils  Tub margarines without trans fats. Light or reduced-fat mayonnaise and salad dressings. Avocado. Olive, canola, sesame, or safflower oils. Natural peanut or almond butter (choose ones without added sugar and oil). The items listed above may not be a complete list of recommended foods or beverages. Contact your dietitian for more options.  Foods to avoid Grains  White bread. White pasta. White rice. Cornbread. Bagels, pastries, and croissants. Crackers that contain trans fat. Vegetables  White potatoes. Corn. Creamed or fried vegetables. Vegetables in a cheese sauce. Fruits  Dried fruits. Canned fruit in light or heavy syrup. Fruit juice. Meats and other protein foods  Fatty cuts of meat. Ribs, chicken wings, bacon, sausage, bologna, salami, chitterlings, fatback, hot dogs, bratwurst, and packaged luncheon meats. Liver and organ meats. Dairy  Whole or 2% milk, cream, half-and-half, and cream cheese. Whole milk cheeses. Whole-fat or sweetened yogurt. Full-fat cheeses. Nondairy creamers and whipped toppings. Processed cheese, cheese spreads, or cheese curds. Beverages  Alcohol. Sweetened drinks (such as sodas, lemonade, and fruit drinks or punches). Fats and oils  Butter, stick margarine, lard, shortening, ghee, or bacon fat. Coconut, palm kernel, or palm oils. Sweets and desserts  Corn syrup, sugars, honey, and molasses. Candy. Jam and jelly. Syrup. Sweetened cereals. Cookies, pies, cakes, donuts, muffins, and ice cream. The items listed above may not be a complete list of foods and beverages to avoid. Contact your dietitian for more information.  This information is not intended to replace advice given to you by your health care provider. Make sure you discuss any questions you have with your health care provider. Document Released: 11/14/2005 Document Revised: 12/05/2014 Document Reviewed: 02/12/2014 [employer] Interactive Patient Education   2017 [employer].

## 2016-12-12 MED FILL — LEVOTHYROXINE 150 MCG TAB: 150 | 30 days supply | Qty: 30 | Fill #1

## 2016-12-12 MED FILL — OXYBUTYNIN 5 MG TABLET: 5 | 30 days supply | Qty: 60 | Fill #1

## 2016-12-23 ENCOUNTER — Ambulatory Visit (INDEPENDENT_AMBULATORY_CARE_PROVIDER_SITE_OTHER): Payer: MEDICARE | Admitting: Cardiology

## 2016-12-23 ENCOUNTER — Encounter: Payer: Self-pay | Admitting: Cardiology

## 2016-12-23 VITALS — BP 151/77 | HR 53 | Ht 70.0 in | Wt 218.4 lb

## 2016-12-23 DIAGNOSIS — E785 Hyperlipidemia, unspecified: Secondary | ICD-10-CM | POA: Diagnosis not present

## 2016-12-23 DIAGNOSIS — I119 Hypertensive heart disease without heart failure: Secondary | ICD-10-CM | POA: Diagnosis not present

## 2016-12-23 DIAGNOSIS — I493 Ventricular premature depolarization: Secondary | ICD-10-CM

## 2016-12-23 DIAGNOSIS — H10413 Chronic giant papillary conjunctivitis, bilateral: Secondary | ICD-10-CM | POA: Diagnosis not present

## 2016-12-23 DIAGNOSIS — Z961 Presence of intraocular lens: Secondary | ICD-10-CM | POA: Diagnosis not present

## 2016-12-23 DIAGNOSIS — H11153 Pinguecula, bilateral: Secondary | ICD-10-CM | POA: Diagnosis not present

## 2016-12-23 DIAGNOSIS — I10 Essential (primary) hypertension: Secondary | ICD-10-CM | POA: Diagnosis not present

## 2016-12-23 MED FILL — CROMOLYN 4% EYE DROPS: 4 | 25 days supply | Qty: 10 | Fill #0

## 2016-12-23 MED FILL — OLOPATADINE HCL 0.1% EYE DR: 0.1 | 25 days supply | Qty: 5 | Fill #0

## 2016-12-23 NOTE — Progress Notes (Signed)
PCP: Scarlette Calico, MD  Clinic Note: Chief Complaint  Patient presents with  . Follow-up    Pt states no Sx. - f/u Event Monitor to evaluate palpitations follwoing Asthma Attack    HPI: Ronald Bond is a 71 y.o. male with a PMH below who presents today for Follow-up cardiac consultation for palpitations. He has known thoracic aortic atherosclerosis by CT scan. A Myoview in 2014 that was low risk but had reduced EF.He has never had any anginal symptoms or heart failure symptoms. Echocardiogram in 2011 showed normal EF with grade 2 diastolic dysfunction.  Ronald Bond was last seen on 10/10/2016 for evaluation of irregular heartbeats of fluttering/fluctuating heartbeats.  Recent Hospitalizations: None  Studies Reviewed:  2 Week Even Monitor:  Pretty much normal monitor. Showed mostly normal rhythm and borderline low rhythm. No arrhythmias noted.  No manual triggers to suggest any symptoms. Automatic triggers identified PACs (premature atrial contractions and PVCs (premature ventricular contractions) as well as atrial bigeminy (every other beat PAC). All of these are relatively benign.  Interval History: Ronald Bond presents today follow-up indicating that he is not had any further episodes of palpitations. Is monitor showed PACs and PVCs, but no arrhythmias. He has not had any real other symptoms turning. He did not push the button for any symptoms. Pretty much, once he recovered from his asthma exacerbation, he has not noted the palpitations and much. He is now much more energetic and less short of breath. No further asthma attacks. No further dizziness spells. He is otherwise stable also from a cardiac standpoint -cardiac vascular review of symptoms:   No chest pain or shortness of breath with rest or exertion.   No PND, orthopnea or edema.   No palpitations, lightheadedness, dizziness, weakness  No syncope/near syncope, TIA/amaurosis fugax symptoms.  No claudication.  ROS: A  comprehensive was performed. Review of Systems  Constitutional: Negative for malaise/fatigue.  Cardiovascular: Negative for palpitations.  Neurological: Negative for dizziness.  All other systems reviewed and are negative.   Past Medical History:  Diagnosis Date  . GERD (gastroesophageal reflux disease)   . HTN (hypertension)   . Hyperlipidemia   . Hypothyroidism   . Osteoarthritis   . Wears glasses     Past Surgical History:  Procedure Laterality Date  . COLONOSCOPY    . EYE SURGERY     both cataracts  . 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 THYROIDECTOMY  1974  . TONSILLECTOMY    . TOTAL KNEE ARTHROPLASTY  2009   left    Current Meds  Medication Sig  . amLODipine (NORVASC) 10 MG tablet Take 1 tablet (10 mg total) by mouth daily.  Marland Kitchen aspirin 81 MG EC tablet Take 81 mg by mouth daily.    Marland Kitchen atorvastatin (LIPITOR) 40 MG tablet TAKE 1 TABLET BY MOUTH DAILY.  Marland Kitchen HYDROcodone-acetaminophen (NORCO) 10-325 MG tablet Take 1 tablet by mouth every 6 (six) hours as needed.  Marland Kitchen levothyroxine (SYNTHROID, LEVOTHROID) 150 MCG tablet Take 1 tablet (150 mcg total) by mouth daily before breakfast.  . losartan (COZAAR) 100 MG tablet Take 1 tablet (100 mg total) by mouth daily.  . mometasone-formoterol (DULERA) 100-5 MCG/ACT AERO Inhale 2 puffs into the lungs 2 (two) times daily.  . Multiple Vitamins-Minerals (MULTIVITAMIN ADULTS 50+ PO) Take by mouth.  . oxybutynin (DITROPAN) 5 MG tablet   .  promethazine-dextromethorphan (PROMETHAZINE-DM) 6.25-15 MG/5ML syrup Take 5 mLs by mouth 4 (four) times daily as needed for cough.  . tamsulosin (FLOMAX) 0.4 MG CAPS capsule TAKE 1 CAPSULE (0.4 MG TOTAL) BY MOUTH DAILY AFTER SUPPER.    Allergies  Allergen Reactions  . Lisinopril Other (See Comments)    edema    Social History   Social  History  . Marital status: Married    Spouse name: N/A  . Number of children: N/A  . Years of education: N/A   Occupational History  . Retired Disabled   Social History Main Topics  . Smoking status: Never Smoker  . Smokeless tobacco: Never Used  . Alcohol use No  . Drug use: No  . Sexual activity: Yes   Other Topics Concern  . None   Social History Narrative   Regular Exercise -  YES          family history includes Brain cancer in his mother; Heart disease in his brother and father.  Wt Readings from Last 3 Encounters:  12/23/16 99.1 kg (218 lb 6.4 oz)  11/29/16 99.8 kg (220 lb 1.9 oz)  10/10/16 95.9 kg (211 lb 6.4 oz)    PHYSICAL EXAM BP (!) 151/77   Pulse (!) 53   Ht 5\' 10"  (1.778 m)   Wt 99.1 kg (218 lb 6.4 oz)   BMI 31.34 kg/m  General appearance: alert, cooperative, appears stated age, no distress and mildly obese. Well nourished &groomed. Lungs: clear to auscultation bilaterally except faint expiratory wheezing (with forced expiration), normal percussion bilaterally and non-labored Heart: regular rate and rhythm, S1, S2 normal, no murmur, click, rub or gallop; non-displaced PMI Abdomen: soft, non-tender; bowel sounds normal; no masses,  no organomegaly; no HJR Extremities: extremities normal, atraumatic, no cyanosis, or edema Pulses: 2+ and symmetric; Neurologic: Mental status: Alert, oriented, thought content appropriate   Adult ECG Report n/a   Other studies Reviewed: Additional studies/ records that were reviewed today include:  Recent Labs:  n/a     ASSESSMENT / PLAN: Problem List Items Addressed This Visit    Hypertensive heart disease without CHF (congestive heart failure) (Chronic)    No active heart failure symptoms. He is on ARB and amlodipine. May consider beta 1 selective beta blocker for additional blood pressure control given his PVCs, however his resting heart rate is 59 BPM. Also consider diuretic such as chlorthalidone.  No  active cardiac symptoms, I will simply refer him back to his PCP.      Hyperlipidemia with target LDL less than 100 (Chronic)    Managed by PCP. On atorvastatin      Essential hypertension, benign (Chronic)    Again blood pressure is low but high today. I would defer further management to PCP, but could consider low-dose beta blocker. I did not start One-A-Day partly because of his recent asthma attack, but also because resting heart rate is 59      Frequent PVCs - Primary    Relatively asymptomatic now from PVCs. I was reluctant to start a beta blocker due to his recent asthma attack. If he does have some more sensations of palpitations, I would probably consider bisoprolol or metoprolol which could also be considered for additional blood pressure control if desired.  Now, since he is not having any further symptoms, I think it may have been related to use of inhaled beta agonists leading to further palpitations. Without recurrent symptoms, I'm reluctant to start a beta blocker.  Current medicines are reviewed at length with the patient today. (+/- concerns) n/a The following changes have been made: n/a  Patient Instructions  No change with current treatment     Your physician recommends that you schedule a follow-up appointment in as needed    Studies Ordered:   No orders of the defined types were placed in this encounter.     Glenetta Hew, M.D., M.S. Interventional Cardiologist   Pager # [phone] Phone # [phone] [address]. [place], [place] [address]

## 2016-12-23 NOTE — Patient Instructions (Signed)
No change with current treatment     Your physician recommends that you schedule a follow-up appointment in as needed

## 2016-12-25 ENCOUNTER — Encounter: Payer: Self-pay | Admitting: Cardiology

## 2016-12-25 DIAGNOSIS — I119 Hypertensive heart disease without heart failure: Secondary | ICD-10-CM | POA: Insufficient documentation

## 2016-12-25 NOTE — Assessment & Plan Note (Signed)
No active heart failure symptoms. He is on ARB and amlodipine. May consider beta 1 selective beta blocker for additional blood pressure control given his PVCs, however his resting heart rate is 59 BPM. Also consider diuretic such as chlorthalidone.  No active cardiac symptoms, I will simply refer him back to his PCP.

## 2016-12-25 NOTE — Assessment & Plan Note (Signed)
Managed by PCP. On atorvastatin

## 2016-12-25 NOTE — Assessment & Plan Note (Signed)
Again blood pressure is low but high today. I would defer further management to PCP, but could consider low-dose beta blocker. I did not start One-A-Day partly because of his recent asthma attack, but also because resting heart rate is 59

## 2016-12-25 NOTE — Assessment & Plan Note (Signed)
Relatively asymptomatic now from PVCs. I was reluctant to start a beta blocker due to his recent asthma attack. If he does have some more sensations of palpitations, I would probably consider bisoprolol or metoprolol which could also be considered for additional blood pressure control if desired.  Now, since he is not having any further symptoms, I think it may have been related to use of inhaled beta agonists leading to further palpitations. Without recurrent symptoms, I'm reluctant to start a beta blocker.

## 2016-12-26 ENCOUNTER — Ambulatory Visit: Payer: MEDICARE | Admitting: Internal Medicine

## 2016-12-28 ENCOUNTER — Encounter: Payer: Self-pay | Admitting: Internal Medicine

## 2016-12-28 ENCOUNTER — Other Ambulatory Visit (INDEPENDENT_AMBULATORY_CARE_PROVIDER_SITE_OTHER): Payer: MEDICARE

## 2016-12-28 ENCOUNTER — Ambulatory Visit (INDEPENDENT_AMBULATORY_CARE_PROVIDER_SITE_OTHER): Payer: MEDICARE | Admitting: Internal Medicine

## 2016-12-28 VITALS — BP 140/70 | HR 61 | Temp 97.7°F | Resp 16 | Ht 70.0 in | Wt 221.2 lb

## 2016-12-28 DIAGNOSIS — M5412 Radiculopathy, cervical region: Secondary | ICD-10-CM

## 2016-12-28 DIAGNOSIS — K219 Gastro-esophageal reflux disease without esophagitis: Secondary | ICD-10-CM | POA: Diagnosis not present

## 2016-12-28 DIAGNOSIS — E038 Other specified hypothyroidism: Secondary | ICD-10-CM | POA: Diagnosis not present

## 2016-12-28 DIAGNOSIS — R972 Elevated prostate specific antigen [PSA]: Secondary | ICD-10-CM

## 2016-12-28 DIAGNOSIS — I10 Essential (primary) hypertension: Secondary | ICD-10-CM

## 2016-12-28 LAB — CBC WITH DIFFERENTIAL/PLATELET
BASOS PCT: 0.7 % (ref 0.0–3.0)
Basophils Absolute: 0.1 10*3/uL (ref 0.0–0.1)
Eosinophils Absolute: 0.4 10*3/uL (ref 0.0–0.7)
Eosinophils Relative: 4.5 % (ref 0.0–5.0)
HEMATOCRIT: 38.9 % — AB (ref 39.0–52.0)
Hemoglobin: 12.9 g/dL — ABNORMAL LOW (ref 13.0–17.0)
LYMPHS PCT: 16.4 % (ref 12.0–46.0)
Lymphs Abs: 1.5 10*3/uL (ref 0.7–4.0)
MCHC: 33.2 g/dL (ref 30.0–36.0)
MCV: 90.4 fl (ref 78.0–100.0)
MONOS PCT: 8.3 % (ref 3.0–12.0)
Monocytes Absolute: 0.8 10*3/uL (ref 0.1–1.0)
NEUTROS ABS: 6.4 10*3/uL (ref 1.4–7.7)
Neutrophils Relative %: 70.1 % (ref 43.0–77.0)
Platelets: 280 10*3/uL (ref 150.0–400.0)
RBC: 4.31 Mil/uL (ref 4.22–5.81)
RDW: 13.7 % (ref 11.5–15.5)
WBC: 9.2 10*3/uL (ref 4.0–10.5)

## 2016-12-28 LAB — BASIC METABOLIC PANEL
BUN: 15 mg/dL (ref 6–23)
CALCIUM: 9.4 mg/dL (ref 8.4–10.5)
CO2: 30 mEq/L (ref 19–32)
CREATININE: 1.33 mg/dL (ref 0.40–1.50)
Chloride: 105 mEq/L (ref 96–112)
GFR: 68.15 mL/min (ref >60.00)
Glucose, Bld: 110 mg/dL — ABNORMAL HIGH (ref 70–99)
Potassium: 3.9 mEq/L (ref 3.5–5.1)
Sodium: 141 mEq/L (ref 135–145)

## 2016-12-28 LAB — TSH: TSH: 1.26 u[IU]/mL (ref 0.35–4.50)

## 2016-12-28 LAB — PSA: PSA: 3.5 ng/mL (ref 0.10–4.00)

## 2016-12-28 MED ORDER — CYCLOBENZAPRINE HCL 5 MG PO TABS: 5.0000 mg | ORAL_TABLET | Freq: Three times a day (TID) | ORAL | 3 refills | Status: DC

## 2016-12-28 MED FILL — CYCLOBENZAPRINE 5 MG TABLET: 5 | 20 days supply | Qty: 60 | Fill #0

## 2016-12-28 NOTE — Patient Instructions (Signed)
Hypothyroidism Hypothyroidism is a disorder of the thyroid. The thyroid is a large gland that is located in the lower front of the neck. The thyroid releases hormones that control how the body works. With hypothyroidism, the thyroid does not make enough of these hormones. What are the causes? Causes of hypothyroidism may include:  Viral infections.  Pregnancy.  Your own defense system (immune system) attacking your thyroid.  Certain medicines.  Birth defects.  Past radiation treatments to your head or neck.  Past treatment with radioactive iodine.  Past surgical removal of part or all of your thyroid.  Problems with the gland that is located in the center of your brain (pituitary).  What are the signs or symptoms? Signs and symptoms of hypothyroidism may include:  Feeling as though you have no energy (lethargy).  Inability to tolerate cold.  Weight gain that is not explained by a change in diet or exercise habits.  Dry skin.  Coarse hair.  Menstrual irregularity.  Slowing of thought processes.  Constipation.  Sadness or depression.  How is this diagnosed? Your health care provider may diagnose hypothyroidism with blood tests and ultrasound tests. How is this treated? Hypothyroidism is treated with medicine that replaces the hormones that your body does not make. After you begin treatment, it may take several weeks for symptoms to go away. Follow these instructions at home:  Take medicines only as directed by your health care provider.  If you start taking any new medicines, tell your health care provider.  Keep all follow-up visits as directed by your health care provider. This is important. As your condition improves, your dosage needs may change. You will need to have blood tests regularly so that your health care provider can watch your condition. Contact a health care provider if:  Your symptoms do not get better with treatment.  You are taking thyroid  replacement medicine and: ? You sweat excessively. ? You have tremors. ? You feel anxious. ? You lose weight rapidly. ? You cannot tolerate heat. ? You have emotional swings. ? You have diarrhea. ? You feel weak. Get help right away if:  You develop chest pain.  You develop an irregular heartbeat.  You develop a rapid heartbeat. This information is not intended to replace advice given to you by your health care provider. Make sure you discuss any questions you have with your health care provider. Document Released: 11/14/2005 Document Revised: 04/21/2016 Document Reviewed: 04/01/2014 Elsevier Interactive Patient Education  2017 Elsevier Inc.  

## 2016-12-28 NOTE — Progress Notes (Signed)
Pre visit review using our clinic review tool, if applicable. No additional management support is needed unless otherwise documented below in the visit note. 

## 2016-12-28 NOTE — Progress Notes (Signed)
Subjective:  Patient ID: Ronald Bond, male    DOB: [birth year]  Age: 71 y.o. MRN: [id number]  CC: Hypertension and Hypothyroidism   HPI Ronald Bond presents for f/up - He's had a few episodes of neck pain and wants a refill on Flexeril. The pain does not radiate into his extremities and he denies numbness/weakness/tingling. His blood pressure has been well controlled and he feels like his current levothyroxine dose is adequate.  Outpatient Medications Prior to Visit  Medication Sig Dispense Refill  . amLODipine (NORVASC) 10 MG tablet Take 1 tablet (10 mg total) by mouth daily. 90 tablet 3  . aspirin 81 MG EC tablet Take 81 mg by mouth daily.      Marland Kitchen atorvastatin (LIPITOR) 40 MG tablet TAKE 1 TABLET BY MOUTH DAILY. 90 tablet 1  . HYDROcodone-acetaminophen (NORCO) 10-325 MG tablet Take 1 tablet by mouth every 6 (six) hours as needed. 75 tablet 0  . levothyroxine (SYNTHROID, LEVOTHROID) 150 MCG tablet Take 1 tablet (150 mcg total) by mouth daily before breakfast. 90 tablet 1  . losartan (COZAAR) 100 MG tablet Take 1 tablet (100 mg total) by mouth daily. 145 tablet 1  . mometasone-formoterol (DULERA) 100-5 MCG/ACT AERO Inhale 2 puffs into the lungs 2 (two) times daily. 13 g 11  . oxybutynin (DITROPAN) 5 MG tablet     . tamsulosin (FLOMAX) 0.4 MG CAPS capsule TAKE 1 CAPSULE (0.4 MG TOTAL) BY MOUTH DAILY AFTER SUPPER. 90 capsule 1  . Multiple Vitamins-Minerals (MULTIVITAMIN ADULTS 50+ PO) Take by mouth.    . promethazine-dextromethorphan (PROMETHAZINE-DM) 6.25-15 MG/5ML syrup Take 5 mLs by mouth 4 (four) times daily as needed for cough. 118 mL 0   No facility-administered medications prior to visit.     ROS Review of Systems  Constitutional: Negative for appetite change, chills, diaphoresis, fatigue and unexpected weight change.  HENT: Negative.   Eyes: Negative for visual disturbance.  Respiratory: Negative for cough, chest tightness, shortness of breath and wheezing.     Cardiovascular: Negative for chest pain, palpitations and leg swelling.  Gastrointestinal: Negative for abdominal pain, constipation, diarrhea, nausea and vomiting.  Endocrine: Negative for cold intolerance and heat intolerance.  Genitourinary: Negative.  Negative for difficulty urinating and urgency.  Musculoskeletal: Positive for neck pain. Negative for back pain, myalgias and neck stiffness.  Skin: Negative.  Negative for color change and rash.  Allergic/Immunologic: Negative.   Neurological: Negative.   Hematological: Negative for adenopathy. Does not bruise/bleed easily.  Psychiatric/Behavioral: Negative.     Objective:  BP 140/70 (BP Location: Left Arm, Patient Position: Sitting, Cuff Size: Large)   Pulse 61   Temp 97.7 F (36.5 C) (Oral)   Resp 16   Ht 5\' 10"  (1.778 m)   Wt 221 lb 4 oz (100.4 kg)   SpO2 97%   BMI 31.75 kg/m   BP Readings from Last 3 Encounters:  12/28/16 140/70  12/23/16 (!) 151/77  11/29/16 (!) 148/80    Wt Readings from Last 3 Encounters:  12/28/16 221 lb 4 oz (100.4 kg)  12/23/16 218 lb 6.4 oz (99.1 kg)  11/29/16 220 lb 1.9 oz (99.8 kg)    Physical Exam  Constitutional: He is oriented to person, place, and time. No distress.  HENT:  Mouth/Throat: Oropharynx is clear and moist. No oropharyngeal exudate.  Eyes: Conjunctivae are normal. Right eye exhibits no discharge. Left eye exhibits no discharge. No scleral icterus.  Neck: Normal range of motion. Neck supple. No JVD present. No tracheal  deviation present. No thyromegaly present.  Cardiovascular: Normal rate, regular rhythm, normal heart sounds and intact distal pulses.  Exam reveals no gallop and no friction rub.   No murmur heard. Pulmonary/Chest: Effort normal and breath sounds normal. No stridor. No respiratory distress. He has no wheezes. He has no rales. He exhibits no tenderness.  Abdominal: Soft. Bowel sounds are normal. He exhibits no distension and no mass. There is no tenderness.  There is no rebound and no guarding.  Musculoskeletal: Normal range of motion. He exhibits no edema, tenderness or deformity.  Lymphadenopathy:    He has no cervical adenopathy.  Neurological: He is oriented to person, place, and time.  Skin: Skin is warm and dry. No rash noted. He is not diaphoretic. No erythema. No pallor.  Vitals reviewed.   Lab Results  Component Value Date   WBC 9.2 12/28/2016   HGB 12.9 (L) 12/28/2016   HCT 38.9 (L) 12/28/2016   PLT 280.0 12/28/2016   GLUCOSE 110 (H) 12/28/2016   CHOL 149 09/27/2016   TRIG 188.0 (H) 09/27/2016   HDL 58.60 09/27/2016   LDLDIRECT 75.0 03/08/2016   LDLCALC 53 09/27/2016   ALT 14 09/27/2016   AST 10 09/27/2016   NA 141 12/28/2016   K 3.9 12/28/2016   CL 105 12/28/2016   CREATININE 1.33 12/28/2016   BUN 15 12/28/2016   CO2 30 12/28/2016   TSH 1.26 12/28/2016   PSA 3.50 12/28/2016   INR 0.9 10/17/2007   HGBA1C 6.1 04/02/2013    Ct Angio Chest Pe W Or Wo Contrast  Result Date: 09/28/2016 CLINICAL DATA:  Shortness of breath.  Elevated D-dimer level. EXAM: CT ANGIOGRAPHY CHEST WITH CONTRAST TECHNIQUE: Multidetector CT imaging of the chest was performed using the standard protocol during bolus administration of intravenous contrast. Multiplanar CT image reconstructions and MIPs were obtained to evaluate the vascular anatomy. CONTRAST:  80 mL of Isovue 370 intravenously. COMPARISON:  None. FINDINGS: Cardiovascular: Atherosclerosis of thoracic aorta is noted without aneurysm or dissection. There is no evidence of pulmonary embolus. Mediastinum/Nodes: No mediastinal mass or adenopathy is noted. Lungs/Pleura: No pneumothorax or pleural effusion is noted. No acute pulmonary disease is noted. Upper Abdomen: No significant abnormality seen in the visualized portion of upper abdomen. Musculoskeletal: Anterior osteophyte formation is noted in the lower thoracic spine. Review of the MIP images confirms the above findings. IMPRESSION: No  evidence of pulmonary embolus. Aortic atherosclerosis. No acute abnormality seen in the chest. Electronically Signed   By: Marijo Conception, M.D.   On: 09/28/2016 13:39    Assessment & Plan:   Zacarius was seen today for hypertension and hypothyroidism.  Diagnoses and all orders for this visit:  Other specified hypothyroidism- His TSH is in the normal range, he will remain on the current dose of levothyroxine. -     TSH; Future  Essential hypertension, benign- his blood pressures adequately well-controlled, electrolytes and renal function are normal. -     Basic metabolic panel; Future  Gastroesophageal reflux disease without esophagitis- he is mildly anemic but has no atypical upper GI symptoms. I will reevaluate the anemia the next time I see him. -     CBC with Differential/Platelet; Future  Right cervical radiculopathy -     cyclobenzaprine (FLEXERIL) 5 MG tablet; Take 1 tablet (5 mg total) by mouth 3 (three) times daily.  PSA elevation- his PSA has trended down over the last year so I am not overly concerned about prostate cancer. Will continue to check  this on an every 4-6 month basis. -     PSA; Future   I have discontinued Mr. Bitler promethazine-dextromethorphan and Multiple Vitamins-Minerals (MULTIVITAMIN ADULTS 50+ PO). I am also having him start on cyclobenzaprine. Additionally, I am having him maintain his aspirin, mometasone-formoterol, atorvastatin, tamsulosin, losartan, HYDROcodone-acetaminophen, amLODipine, levothyroxine, oxybutynin, olopatadine, and cromolyn.  Meds ordered this encounter  Medications  . olopatadine (PATANOL) 0.1 % ophthalmic solution  . cromolyn (OPTICROM) 4 % ophthalmic solution  . cyclobenzaprine (FLEXERIL) 5 MG tablet    Sig: Take 1 tablet (5 mg total) by mouth 3 (three) times daily.    Dispense:  60 tablet    Refill:  3     Follow-up: Return in about 6 months (around 06/27/2017).  Scarlette Calico, MD

## 2017-01-04 DIAGNOSIS — J209 Acute bronchitis, unspecified: Secondary | ICD-10-CM | POA: Diagnosis not present

## 2017-01-04 MED FILL — AZITHROMYCIN 250 MG TABLET: 250 | 5 days supply | Qty: 6 | Fill #0

## 2017-01-16 MED FILL — ATORVASTATIN 40 MG TABLET: 40 | 30 days supply | Qty: 30 | Fill #3

## 2017-01-16 MED FILL — LEVOTHYROXINE 150 MCG TAB: 150 | 30 days supply | Qty: 30 | Fill #2

## 2017-02-13 MED FILL — OXYBUTYNIN 5 MG TABLET: 5 | 30 days supply | Qty: 60 | Fill #2

## 2017-02-13 MED FILL — ATORVASTATIN 40 MG TABLET: 40 | 30 days supply | Qty: 30 | Fill #4

## 2017-02-13 MED FILL — AMLODIPINE BESYLATE 10 MG T: 10 | 90 days supply | Qty: 90 | Fill #0

## 2017-02-13 MED FILL — LOSARTAN POTASSIUM 100 MG T: 100 | 90 days supply | Qty: 90 | Fill #1

## 2017-02-13 MED FILL — LEVOTHYROXINE 150 MCG TAB: 150 | 30 days supply | Qty: 30 | Fill #3

## 2017-02-15 ENCOUNTER — Encounter: Payer: Self-pay | Admitting: Family

## 2017-02-15 ENCOUNTER — Ambulatory Visit (INDEPENDENT_AMBULATORY_CARE_PROVIDER_SITE_OTHER): Payer: MEDICARE | Admitting: Family

## 2017-02-15 VITALS — BP 150/84 | HR 65 | Temp 98.5°F | Resp 16 | Ht 70.0 in | Wt 217.0 lb

## 2017-02-15 DIAGNOSIS — M1711 Unilateral primary osteoarthritis, right knee: Secondary | ICD-10-CM | POA: Diagnosis not present

## 2017-02-15 NOTE — Progress Notes (Signed)
Subjective:    Patient ID: Ronald Bond, male    DOB: [birth year], 71 y.o.   MRN: [id number]  Chief Complaint  Patient presents with  . Knee Pain    right knee pain and swelling, x4 days does have hx of knee problems    HPI:  Ronald Bond is a 71 y.o. male who  has a past medical history of GERD (gastroesophageal reflux disease); HTN (hypertension); Hyperlipidemia; Hypothyroidism; Osteoarthritis; and Wears glasses. and presents today for an office visit.  Associated symptom of pain located in his right knee has been going on for about 4 days. Denies trauma or injury. Pain is generally when he walks and located in primarily in the posterior aspect of his knee. Modifying factors include home ointment which has not helped very much. Describes that heat has helped a little. Unable to extend knee fully. No sounds or sensations heard or felt.   Allergies  Allergen Reactions  . Lisinopril Other (See Comments)    edema      Outpatient Medications Prior to Visit  Medication Sig Dispense Refill  . amLODipine (NORVASC) 10 MG tablet Take 1 tablet (10 mg total) by mouth daily. 90 tablet 3  . aspirin 81 MG EC tablet Take 81 mg by mouth daily.      Marland Kitchen atorvastatin (LIPITOR) 40 MG tablet TAKE 1 TABLET BY MOUTH DAILY. 90 tablet 1  . cromolyn (OPTICROM) 4 % ophthalmic solution     . cyclobenzaprine (FLEXERIL) 5 MG tablet Take 1 tablet (5 mg total) by mouth 3 (three) times daily. 60 tablet 3  . HYDROcodone-acetaminophen (NORCO) 10-325 MG tablet Take 1 tablet by mouth every 6 (six) hours as needed. 75 tablet 0  . levothyroxine (SYNTHROID, LEVOTHROID) 150 MCG tablet Take 1 tablet (150 mcg total) by mouth daily before breakfast. 90 tablet 1  . losartan (COZAAR) 100 MG tablet Take 1 tablet (100 mg total) by mouth daily. 145 tablet 1  . mometasone-formoterol (DULERA) 100-5 MCG/ACT AERO Inhale 2 puffs into the lungs 2 (two) times daily. 13 g 11  . olopatadine (PATANOL) 0.1 % ophthalmic solution       . oxybutynin (DITROPAN) 5 MG tablet     . tamsulosin (FLOMAX) 0.4 MG CAPS capsule TAKE 1 CAPSULE (0.4 MG TOTAL) BY MOUTH DAILY AFTER SUPPER. 90 capsule 1   No facility-administered medications prior to visit.      Review of Systems  Constitutional: Negative for chills and fever.  Musculoskeletal:       Positive for knee pain.   Neurological: Negative for weakness and numbness.      Objective:    BP (!) 150/84 (BP Location: Left Arm, Patient Position: Sitting, Cuff Size: Large)   Pulse 65   Temp 98.5 F (36.9 C) (Oral)   Resp 16   Ht 5\' 10"  (1.778 m)   Wt 217 lb (98.4 kg)   SpO2 92%   BMI 31.14 kg/m  Nursing note and vital signs reviewed.  Physical Exam  Constitutional: He is oriented to person, place, and time. He appears well-developed and well-nourished. No distress.  Cardiovascular: Normal rate, regular rhythm, normal heart sounds and intact distal pulses.   Pulmonary/Chest: Effort normal and breath sounds normal.  Musculoskeletal:  Right knee - no obvious deformity or discoloration with moderate edema. No palpable tenderness able to be elicited. Range of motion slightly decreased in knee extension secondary to edema. Ligamentous and meniscal testing are negative. Distal pulses and sensation are intact and appropriate.  Neurological: He is alert and oriented to person, place, and time.  Skin: Skin is warm and dry.  Psychiatric: He has a normal mood and affect. His behavior is normal. Judgment and thought content normal.   Procedure: Corticosteroid injection of the right knee  Description: Informed consent was obtained with discussion including risks and benefits of the procedure. Patient verbally wished to continued. Time out was performed. The site was identified and marked. It was cleansed with betadine using a concentric circular pattern. Skin anesthesia was applied using cold spray applied for 10 seconds. Injection of 3ml :4 ml of Kenalog (40 mg / ml) to Sensoricaine  was injected following aspiration with no return noted. Following the injection there was instant relief confirming appropriate placement. A bandage was applied to the area and post care instructions were provided. The procedure was tolerated well with no complications.      Assessment & Plan:   Problem List Items Addressed This Visit      Musculoskeletal and Integument   Primary osteoarthritis of right knee - Primary    Symptoms and exam consistent with osteoarthritis of the right knee based on review of x-rays performed in 2015. Most likely exacerbation from overuse. Steroid injection provided. Attempted fluid removal with minimal return. Recommend ice, elevation, and over-the-counter anti-inflammatories as needed for inflammation. Follow-up if symptoms worsen or do not improve.          I am having Mr. Bogan maintain his aspirin, mometasone-formoterol, atorvastatin, tamsulosin, losartan, HYDROcodone-acetaminophen, amLODipine, levothyroxine, oxybutynin, olopatadine, cromolyn, and cyclobenzaprine.   Follow-up: Return if symptoms worsen or fail to improve.  [name], FNP

## 2017-02-15 NOTE — Assessment & Plan Note (Signed)
Symptoms and exam consistent with osteoarthritis of the right knee based on review of x-rays performed in 2015. Most likely exacerbation from overuse. Steroid injection provided. Attempted fluid removal with minimal return. Recommend ice, elevation, and over-the-counter anti-inflammatories as needed for inflammation. Follow-up if symptoms worsen or do not improve.

## 2017-02-15 NOTE — Patient Instructions (Addendum)
Thank you for choosing [facility].  SUMMARY AND INSTRUCTIONS:  Ice x 20 minutes every 2 hours and after activity.  Compression and elevation.   No heavy lifting for the next 24 hours.   Tylenol 650 mg 4 x daily as needed for pain.   Medication:  Your prescription(s) have been submitted to your pharmacy or been printed and provided for you. Please take as directed and contact our office if you believe you are having problem(s) with the medication(s) or have any questions.  Follow up:  If your symptoms worsen or fail to improve, please contact our office for further instruction, or in case of emergency go directly to the emergency room at the closest medical facility.

## 2017-03-16 MED FILL — ATORVASTATIN 40 MG TABLET: 40 | 30 days supply | Qty: 30 | Fill #5

## 2017-03-16 MED FILL — OXYBUTYNIN 5 MG TABLET: 5 | 30 days supply | Qty: 60 | Fill #3

## 2017-03-16 MED FILL — LEVOTHYROXINE 150 MCG TAB: 150 | 30 days supply | Qty: 30 | Fill #4

## 2017-03-20 ENCOUNTER — Other Ambulatory Visit: Payer: Self-pay | Admitting: Internal Medicine

## 2017-03-20 DIAGNOSIS — N410 Acute prostatitis: Secondary | ICD-10-CM

## 2017-03-20 DIAGNOSIS — N401 Enlarged prostate with lower urinary tract symptoms: Secondary | ICD-10-CM

## 2017-03-20 DIAGNOSIS — R351 Nocturia: Principal | ICD-10-CM

## 2017-03-20 MED FILL — TAMSULOSIN HCL 0.4 MG CAP: 0.4 | 90 days supply | Qty: 90 | Fill #0 | Status: TO

## 2017-04-10 MED FILL — LEVOTHYROXINE 150 MCG TAB: 150 | 30 days supply | Qty: 30 | Fill #5

## 2017-05-18 ENCOUNTER — Other Ambulatory Visit: Payer: Self-pay | Admitting: Internal Medicine

## 2017-05-18 MED FILL — OXYBUTYNIN 5 MG TABLET: 5 | 30 days supply | Qty: 60 | Fill #4

## 2017-05-18 MED FILL — AMLODIPINE BESYLATE 10 MG T: 10 | 90 days supply | Qty: 90 | Fill #1

## 2017-05-18 MED FILL — LOSARTAN POTASSIUM 100 MG T: 100 | 90 days supply | Qty: 90 | Fill #2

## 2017-05-18 MED FILL — LEVOTHYROXINE 150 MCG TAB: 150 | 30 days supply | Qty: 30 | Fill #5

## 2017-05-18 MED FILL — ATORVASTATIN 40 MG TABLET: 40 | 90 days supply | Qty: 90 | Fill #0

## 2017-05-25 ENCOUNTER — Encounter: Payer: Self-pay | Admitting: Family

## 2017-05-25 ENCOUNTER — Ambulatory Visit (INDEPENDENT_AMBULATORY_CARE_PROVIDER_SITE_OTHER): Payer: MEDICARE | Admitting: Family

## 2017-05-25 VITALS — BP 124/70 | HR 74 | Temp 98.4°F | Resp 16 | Ht 70.0 in | Wt 217.0 lb

## 2017-05-25 DIAGNOSIS — M4802 Spinal stenosis, cervical region: Secondary | ICD-10-CM

## 2017-05-25 MED ORDER — PREDNISONE 20 MG PO TABS: ORAL_TABLET | ORAL | 0 refills | Status: DC

## 2017-05-25 MED ORDER — GABAPENTIN 300 MG PO CAPS: ORAL_CAPSULE | ORAL | 0 refills | Status: DC

## 2017-05-25 MED FILL — predniSONE 20 MG TABS: 20 | 9 days supply | Qty: 18 | Fill #0

## 2017-05-25 MED FILL — GABAPENTIN 300 MG CAPSULE: 300 | 32 days supply | Qty: 60 | Fill #0

## 2017-05-25 NOTE — Patient Instructions (Signed)
Thank you for choosing [facility].  SUMMARY AND INSTRUCTIONS:  Ice / moist heat x 20 minutes every 2 hours and as needed.   Stretches and exercises throughout the day.  Recommend follow up with Dr. Harley Hallmark office for injection if indicated.  Medication:  Your prescription(s) have been submitted to your pharmacy or been printed and provided for you. Please take as directed and contact our office if you believe you are having problem(s) with the medication(s) or have any questions.  Follow up:  If your symptoms worsen or fail to improve, please contact our office for further instruction, or in case of emergency go directly to the emergency room at the closest medical facility.     Cervical Strain and Sprain Rehab Ask your health care provider which exercises are safe for you. Do exercises exactly as told by your health care provider and adjust them as directed. It is normal to feel mild stretching, pulling, tightness, or discomfort as you do these exercises, but you should stop right away if you feel sudden pain or your pain gets worse.Do not begin these exercises until told by your health care provider. Stretching and range of motion exercises These exercises warm up your muscles and joints and improve the movement and flexibility of your neck. These exercises also help to relieve pain, numbness, and tingling. Exercise A: Cervical side bend  1. Using good posture, sit on a stable chair or stand up. 2. Without moving your shoulders, slowly tilt your left / right ear to your shoulder until you feel a stretch in your neck muscles. You should be looking straight ahead. 3. Hold for __________ seconds. 4. Repeat with the other side of your neck. Repeat __________ times. Complete this exercise __________ times a day. Exercise B: Cervical rotation  1. Using good posture, sit on a stable chair or stand up. 2. Slowly turn your head to the side as if you are looking over your left /  right shoulder. ? Keep your eyes level with the ground. ? Stop when you feel a stretch along the side and the back of your neck. 3. Hold for __________ seconds. 4. Repeat this by turning to your other side. Repeat __________ times. Complete this exercise __________ times a day. Exercise C: Thoracic extension and pectoral stretch 1. Roll a towel or a small blanket so it is about 4 inches (10 cm) in diameter. 2. Lie down on your back on a firm surface. 3. Put the towel lengthwise, under your spine in the middle of your back. It should not be not under your shoulder blades. The towel should line up with your spine from your middle back to your lower back. 4. Put your hands behind your head and let your elbows fall out to your sides. 5. Hold for __________ seconds. Repeat __________ times. Complete this exercise __________ times a day. Strengthening exercises These exercises build strength and endurance in your neck. Endurance is the ability to use your muscles for a long time, even after your muscles get tired. Exercise D: Upper cervical flexion, isometric 1. Lie on your back with a thin pillow behind your head and a small rolled-up towel under your neck. 2. Gently tuck your chin toward your chest and nod your head down to look toward your feet. Do not lift your head off the pillow. 3. Hold for __________ seconds. 4. Release the tension slowly. Relax your neck muscles completely before you repeat this exercise. Repeat __________ times. Complete this exercise __________  times a day. Exercise E: Cervical extension, isometric  1. Stand about 6 inches (15 cm) away from a wall, with your back facing the wall. 2. Place a soft object, about 6-8 inches (15-20 cm) in diameter, between the back of your head and the wall. A soft object could be a small pillow, a ball, or a folded towel. 3. Gently tilt your head back and press into the soft object. Keep your jaw and forehead relaxed. 4. Hold for  __________ seconds. 5. Release the tension slowly. Relax your neck muscles completely before you repeat this exercise. Repeat __________ times. Complete this exercise __________ times a day. Posture and body mechanics  Body mechanics refers to the movements and positions of your body while you do your daily activities. Posture is part of body mechanics. Good posture and healthy body mechanics can help to relieve stress in your body's tissues and joints. Good posture means that your spine is in its natural S-curve position (your spine is neutral), your shoulders are pulled back slightly, and your head is not tipped forward. The following are general guidelines for applying improved posture and body mechanics to your everyday activities. Standing  When standing, keep your spine neutral and keep your feet about hip-width apart. Keep a slight bend in your knees. Your ears, shoulders, and hips should line up.  When you do a task in which you stand in one place for a long time, place one foot up on a stable object that is 2-4 inches (5-10 cm) high, such as a footstool. This helps keep your spine neutral. Sitting   When sitting, keep your spine neutral and your keep feet flat on the floor. Use a footrest, if necessary, and keep your thighs parallel to the floor. Avoid rounding your shoulders, and avoid tilting your head forward.  When working at a desk or a computer, keep your desk at a height where your hands are slightly lower than your elbows. Slide your chair under your desk so you are close enough to maintain good posture.  When working at a computer, place your monitor at a height where you are looking straight ahead and you do not have to tilt your head forward or downward to look at the screen. Resting When lying down and resting, avoid positions that are most painful for you. Try to support your neck in a neutral position. You can use a contour pillow or a small rolled-up towel. Your pillow  should support your neck but not push on it. This information is not intended to replace advice given to you by your health care provider. Make sure you discuss any questions you have with your health care provider. Document Released: 11/14/2005 Document Revised: 07/21/2016 Document Reviewed: 10/21/2015 Elsevier Interactive Patient Education  [employer].

## 2017-05-25 NOTE — Progress Notes (Signed)
Subjective:    Patient ID: Ronald Bond, male    DOB: [birth year], 71 y.o.   MRN: [id number]  Chief Complaint  Patient presents with  . Shoulder Pain    right shoulder pain, x1 week but has gotten worse has a hx of problems with that shoulder that he did have to get a shot in, flexeril, hydorcodone, and old rx of gabapentin to help     HPI:  Ronald Bond is a 71 y.o. male who  has a past medical history of GERD (gastroesophageal reflux disease); HTN (hypertension); Hyperlipidemia; Hypothyroidism; Osteoarthritis; and Wears glasses. and presents today for an office visit.   Associated symptom of pain located in his right shoulder has been going on for about 1 week and worsened since initial onset. He is right hand dominant. No trauma or injury. Does have significant history of cervical steonosis. Pain is described as toothache that won't go away. Modifying factors include Flexeril, hydrocodone and gabapentin which seem to help. Severity is enough to disturb his sleep at night. Same pains that he had previously that were improved with a spinal injection by [facility].  Allergies  Allergen Reactions  . Lisinopril Other (See Comments)    edema      Outpatient Medications Prior to Visit  Medication Sig Dispense Refill  . amLODipine (NORVASC) 10 MG tablet Take 1 tablet (10 mg total) by mouth daily. 90 tablet 3  . aspirin 81 MG EC tablet Take 81 mg by mouth daily.      Marland Kitchen atorvastatin (LIPITOR) 40 MG tablet TAKE 1 TABLET BY MOUTH DAILY. 90 tablet 0  . cromolyn (OPTICROM) 4 % ophthalmic solution     . cyclobenzaprine (FLEXERIL) 5 MG tablet Take 1 tablet (5 mg total) by mouth 3 (three) times daily. 60 tablet 3  . HYDROcodone-acetaminophen (NORCO) 10-325 MG tablet Take 1 tablet by mouth every 6 (six) hours as needed. 75 tablet 0  . levothyroxine (SYNTHROID, LEVOTHROID) 150 MCG tablet Take 1 tablet (150 mcg total) by mouth daily before breakfast. 90 tablet 1  . losartan (COZAAR) 100 MG  tablet Take 1 tablet (100 mg total) by mouth daily. 145 tablet 1  . mometasone-formoterol (DULERA) 100-5 MCG/ACT AERO Inhale 2 puffs into the lungs 2 (two) times daily. 13 g 11  . olopatadine (PATANOL) 0.1 % ophthalmic solution     . oxybutynin (DITROPAN) 5 MG tablet     . tamsulosin (FLOMAX) 0.4 MG CAPS capsule TAKE 1 CAPSULE (0.4 MG TOTAL) BY MOUTH DAILY AFTER SUPPER. 90 capsule 1   No facility-administered medications prior to visit.       Review of Systems  Constitutional: Negative for chills and fever.  Musculoskeletal: Positive for neck pain and neck stiffness.  Neurological: Negative for weakness and numbness.      Objective:    BP 124/70 (BP Location: Left Arm, Patient Position: Sitting, Cuff Size: Large)   Pulse 74   Temp 98.4 F (36.9 C) (Oral)   Resp 16   Ht 5\' 10"  (1.778 m)   Wt 217 lb (98.4 kg)   SpO2 98%   BMI 31.14 kg/m  Nursing note and vital signs reviewed.  Physical Exam  Constitutional: He is oriented to person, place, and time. He appears well-developed and well-nourished. No distress.  Neck:  No obvious deformity, discoloration, or edema. Palpable tenderness and muscle spasm of the right upper paraspinal musculature. There is mild tenderness of the right cervical spine transverse processes. Range of motion restricted in  lateral bending and rotation and normal in flexion and extension. Positive Spurling's. Distal pulses and sensation are intact and appropriate.  Cardiovascular: Normal rate, regular rhythm, normal heart sounds and intact distal pulses.   Pulmonary/Chest: Effort normal and breath sounds normal.  Neurological: He is alert and oriented to person, place, and time.  Skin: Skin is warm and dry.  Psychiatric: He has a normal mood and affect. His behavior is normal. Judgment and thought content normal.       Assessment & Plan:   Problem List Items Addressed This Visit      Other   Spinal stenosis in cervical region - Primary    Symptoms and  exam are consistent with exacerbation of previous spinal stenosis which responded well to steroid injections. Start prednisone taper and gabapentin. Recommend ice, moist heat, and home exercise therapy with follow-up to neurosurgery for possible injections if appropriate. Consider physical therapy. Follow-up if symptoms worsen or do not improve.          I am having Ronald Bond start on predniSONE and gabapentin. I am also having him maintain his aspirin, mometasone-formoterol, losartan, HYDROcodone-acetaminophen, amLODipine, levothyroxine, oxybutynin, olopatadine, cromolyn, cyclobenzaprine, tamsulosin, and atorvastatin.   Meds ordered this encounter  Medications  . predniSONE (DELTASONE) 20 MG tablet    Sig: Take 3 tablets by mouth for 3 days, then 2 tablets by mouth for 3 days, then 1 tablet by mouth for 3 days    Dispense:  18 tablet    Refill:  0    Order Specific Question:   Supervising Provider    Answer:   Pricilla Holm A [[id number]]  . gabapentin (NEURONTIN) 300 MG capsule    Sig: Take 1 tablet by mouth nightly for 3 nights then increase to 1 tablet twice daily.    Dispense:  60 capsule    Refill:  0    Order Specific Question:   Supervising Provider    Answer:   Pricilla Holm A [[id number]]     Follow-up: Return if symptoms worsen or fail to improve.  [name], FNP

## 2017-05-25 NOTE — Assessment & Plan Note (Signed)
Symptoms and exam are consistent with exacerbation of previous spinal stenosis which responded well to steroid injections. Start prednisone taper and gabapentin. Recommend ice, moist heat, and home exercise therapy with follow-up to neurosurgery for possible injections if appropriate. Consider physical therapy. Follow-up if symptoms worsen or do not improve.

## 2017-06-01 ENCOUNTER — Ambulatory Visit (INDEPENDENT_AMBULATORY_CARE_PROVIDER_SITE_OTHER): Payer: MEDICARE | Admitting: Internal Medicine

## 2017-06-01 ENCOUNTER — Encounter: Payer: Self-pay | Admitting: Internal Medicine

## 2017-06-01 ENCOUNTER — Other Ambulatory Visit (INDEPENDENT_AMBULATORY_CARE_PROVIDER_SITE_OTHER): Payer: MEDICARE

## 2017-06-01 VITALS — BP 140/80 | HR 57 | Temp 99.1°F | Resp 16 | Ht 70.0 in | Wt 213.0 lb

## 2017-06-01 DIAGNOSIS — R7303 Prediabetes: Secondary | ICD-10-CM | POA: Insufficient documentation

## 2017-06-01 DIAGNOSIS — D539 Nutritional anemia, unspecified: Secondary | ICD-10-CM | POA: Diagnosis not present

## 2017-06-01 DIAGNOSIS — R972 Elevated prostate specific antigen [PSA]: Secondary | ICD-10-CM

## 2017-06-01 DIAGNOSIS — N401 Enlarged prostate with lower urinary tract symptoms: Secondary | ICD-10-CM | POA: Diagnosis not present

## 2017-06-01 DIAGNOSIS — R1310 Dysphagia, unspecified: Secondary | ICD-10-CM | POA: Diagnosis not present

## 2017-06-01 DIAGNOSIS — K2100 Gastro-esophageal reflux disease with esophagitis, without bleeding: Secondary | ICD-10-CM

## 2017-06-01 DIAGNOSIS — E032 Hypothyroidism due to medicaments and other exogenous substances: Secondary | ICD-10-CM | POA: Diagnosis not present

## 2017-06-01 DIAGNOSIS — I10 Essential (primary) hypertension: Secondary | ICD-10-CM | POA: Diagnosis not present

## 2017-06-01 DIAGNOSIS — R351 Nocturia: Secondary | ICD-10-CM

## 2017-06-01 DIAGNOSIS — E669 Obesity, unspecified: Secondary | ICD-10-CM

## 2017-06-01 DIAGNOSIS — Z1211 Encounter for screening for malignant neoplasm of colon: Secondary | ICD-10-CM | POA: Insufficient documentation

## 2017-06-01 LAB — CBC WITH DIFFERENTIAL/PLATELET
BASOS PCT: 0.1 % (ref 0.0–3.0)
Basophils Absolute: 0 10*3/uL (ref 0.0–0.1)
EOS PCT: 0 % (ref 0.0–5.0)
Eosinophils Absolute: 0 10*3/uL (ref 0.0–0.7)
HCT: 39.4 % (ref 39.0–52.0)
Hemoglobin: 13 g/dL (ref 13.0–17.0)
LYMPHS ABS: 0.8 10*3/uL (ref 0.7–4.0)
Lymphocytes Relative: 4.9 % — ABNORMAL LOW (ref 12.0–46.0)
MCHC: 32.9 g/dL (ref 30.0–36.0)
MCV: 89.7 fl (ref 78.0–100.0)
Monocytes Absolute: 0.9 10*3/uL (ref 0.1–1.0)
Monocytes Relative: 5.4 % (ref 3.0–12.0)
NEUTROS ABS: 14.4 10*3/uL — AB (ref 1.4–7.7)
NEUTROS PCT: 89.6 % — AB (ref 43.0–77.0)
PLATELETS: 291 10*3/uL (ref 150.0–400.0)
RBC: 4.4 Mil/uL (ref 4.22–5.81)
RDW: 14.4 % (ref 11.5–15.5)
WBC: 16 10*3/uL — ABNORMAL HIGH (ref 4.0–10.5)

## 2017-06-01 LAB — BASIC METABOLIC PANEL
BUN: 23 mg/dL (ref 6–23)
CALCIUM: 9.2 mg/dL (ref 8.4–10.5)
CO2: 27 meq/L (ref 19–32)
CREATININE: 1.47 mg/dL (ref 0.40–1.50)
Chloride: 100 mEq/L (ref 96–112)
GFR: 60.64 mL/min (ref >60.00)
Glucose, Bld: 141 mg/dL — ABNORMAL HIGH (ref 70–99)
Potassium: 3.7 mEq/L (ref 3.5–5.1)
Sodium: 136 mEq/L (ref 135–145)

## 2017-06-01 LAB — IBC PANEL
IRON: 102 ug/dL (ref 42–165)
Saturation Ratios: 22.3 % (ref 20.0–50.0)
Transferrin: 326 mg/dL (ref 212.0–360.0)

## 2017-06-01 LAB — FERRITIN: FERRITIN: 66.7 ng/mL (ref 22.0–322.0)

## 2017-06-01 LAB — HEMOGLOBIN A1C: HEMOGLOBIN A1C: 6.3 % (ref 4.6–6.5)

## 2017-06-01 LAB — PSA: PSA: 2 ng/mL (ref 0.10–4.00)

## 2017-06-01 LAB — TSH: TSH: 0.1 u[IU]/mL — ABNORMAL LOW (ref 0.35–4.50)

## 2017-06-01 LAB — FOLATE: Folate: 12 ng/mL (ref >5.9)

## 2017-06-01 LAB — VITAMIN B12: Vitamin B-12: 1218 pg/mL — ABNORMAL HIGH (ref 211–911)

## 2017-06-01 NOTE — Progress Notes (Signed)
Subjective:  Patient ID: Ronald Bond, male    DOB: 1946-02-03  Age: 71 y.o. MRN: [id number]  CC: Anemia; Hypertension; and Hypothyroidism   HPI Ronald Bond presents for f/up - He was recently seen for shoulder pain and swelling and was prescribed a course of systemic steroids. He tells me those symptoms are improving. He is also due for follow-up today on anemia. He complains of chronic fatigue but has had no recent episodes of shortness of breath. He does complain of worsening odynophagia and dysphagia over the last few months. He's had no episodes of abdominal pain, nausea, vomiting, loss of appetite, weight loss, melena, or blood in his stool.  Outpatient Medications Prior to Visit  Medication Sig Dispense Refill  . amLODipine (NORVASC) 10 MG tablet Take 1 tablet (10 mg total) by mouth daily. 90 tablet 3  . aspirin 81 MG EC tablet Take 81 mg by mouth daily.      Marland Kitchen atorvastatin (LIPITOR) 40 MG tablet TAKE 1 TABLET BY MOUTH DAILY. 90 tablet 0  . cromolyn (OPTICROM) 4 % ophthalmic solution     . cyclobenzaprine (FLEXERIL) 5 MG tablet Take 1 tablet (5 mg total) by mouth 3 (three) times daily. 60 tablet 3  . gabapentin (NEURONTIN) 300 MG capsule Take 1 tablet by mouth nightly for 3 nights then increase to 1 tablet twice daily. 60 capsule 0  . HYDROcodone-acetaminophen (NORCO) 10-325 MG tablet Take 1 tablet by mouth every 6 (six) hours as needed. 75 tablet 0  . losartan (COZAAR) 100 MG tablet Take 1 tablet (100 mg total) by mouth daily. 145 tablet 1  . mometasone-formoterol (DULERA) 100-5 MCG/ACT AERO Inhale 2 puffs into the lungs 2 (two) times daily. 13 g 11  . olopatadine (PATANOL) 0.1 % ophthalmic solution     . oxybutynin (DITROPAN) 5 MG tablet     . predniSONE (DELTASONE) 20 MG tablet Take 3 tablets by mouth for 3 days, then 2 tablets by mouth for 3 days, then 1 tablet by mouth for 3 days 18 tablet 0  . tamsulosin (FLOMAX) 0.4 MG CAPS capsule TAKE 1 CAPSULE (0.4 MG TOTAL) BY MOUTH  DAILY AFTER SUPPER. 90 capsule 1  . levothyroxine (SYNTHROID, LEVOTHROID) 150 MCG tablet Take 1 tablet (150 mcg total) by mouth daily before breakfast. 90 tablet 1   No facility-administered medications prior to visit.     ROS Review of Systems  Constitutional: Positive for fatigue. Negative for activity change, appetite change, diaphoresis and unexpected weight change.  HENT: Positive for trouble swallowing. Negative for sore throat and voice change.   Eyes: Negative.  Negative for visual disturbance.  Respiratory: Negative for cough, chest tightness, shortness of breath and wheezing.   Cardiovascular: Negative.  Negative for chest pain, palpitations and leg swelling.  Gastrointestinal: Negative for abdominal pain, constipation, diarrhea, nausea and vomiting.  Endocrine: Negative.  Negative for cold intolerance and heat intolerance.  Genitourinary: Negative.  Negative for decreased urine volume, difficulty urinating, frequency, hematuria and urgency.  Musculoskeletal: Positive for arthralgias. Negative for back pain, myalgias and neck pain.  Skin: Negative.  Negative for color change and rash.  Allergic/Immunologic: Negative.   Neurological: Negative.  Negative for dizziness, weakness, light-headedness and headaches.  Hematological: Negative for adenopathy. Does not bruise/bleed easily.  Psychiatric/Behavioral: Negative.     Objective:  BP 140/80 (BP Location: Left Arm, Patient Position: Sitting, Cuff Size: Large)   Pulse (!) 57   Temp 99.1 F (37.3 C) (Oral)   Resp 16  Ht 5\' 10"  (1.778 m)   Wt 213 lb (96.6 kg)   SpO2 100%   BMI 30.56 kg/m   BP Readings from Last 3 Encounters:  06/01/17 140/80  05/25/17 124/70  02/15/17 (!) 150/84    Wt Readings from Last 3 Encounters:  06/01/17 213 lb (96.6 kg)  05/25/17 217 lb (98.4 kg)  02/15/17 217 lb (98.4 kg)    Physical Exam  Constitutional: He is oriented to person, place, and time. No distress.  HENT:  Mouth/Throat:  Oropharynx is clear and moist. No oropharyngeal exudate.  Eyes: Conjunctivae are normal. Right eye exhibits no discharge. Left eye exhibits no discharge. No scleral icterus.  Neck: Normal range of motion. Neck supple. No JVD present. No thyromegaly present.  Cardiovascular: Normal rate, regular rhythm and intact distal pulses.  Exam reveals no gallop and no friction rub.   No murmur heard. Pulmonary/Chest: Effort normal and breath sounds normal. No respiratory distress. He has no wheezes. He has no rales. He exhibits no tenderness.  Abdominal: Soft. Bowel sounds are normal. He exhibits no distension and no mass. There is no tenderness. There is no rebound and no guarding.  Musculoskeletal: Normal range of motion. He exhibits no edema or tenderness.  Lymphadenopathy:    He has no cervical adenopathy.  Neurological: He is alert and oriented to person, place, and time.  Skin: Skin is warm and dry. No rash noted. He is not diaphoretic. No erythema. No pallor.  Vitals reviewed.   Lab Results  Component Value Date   WBC 16.0 (H) 06/01/2017   HGB 13.0 06/01/2017   HCT 39.4 06/01/2017   PLT 291.0 06/01/2017   GLUCOSE 141 (H) 06/01/2017   CHOL 149 09/27/2016   TRIG 188.0 (H) 09/27/2016   HDL 58.60 09/27/2016   LDLDIRECT 75.0 03/08/2016   LDLCALC 53 09/27/2016   ALT 14 09/27/2016   AST 10 09/27/2016   NA 136 06/01/2017   K 3.7 06/01/2017   CL 100 06/01/2017   CREATININE 1.47 06/01/2017   BUN 23 06/01/2017   CO2 27 06/01/2017   TSH 0.10 (L) 06/01/2017   PSA 2.00 06/01/2017   INR 0.9 10/17/2007   HGBA1C 6.3 06/01/2017    Ct Angio Chest Pe W Or Wo Contrast  Result Date: 09/28/2016 CLINICAL DATA:  Shortness of breath.  Elevated D-dimer level. EXAM: CT ANGIOGRAPHY CHEST WITH CONTRAST TECHNIQUE: Multidetector CT imaging of the chest was performed using the standard protocol during bolus administration of intravenous contrast. Multiplanar CT image reconstructions and MIPs were obtained to  evaluate the vascular anatomy. CONTRAST:  80 mL of Isovue 370 intravenously. COMPARISON:  None. FINDINGS: Cardiovascular: Atherosclerosis of thoracic aorta is noted without aneurysm or dissection. There is no evidence of pulmonary embolus. Mediastinum/Nodes: No mediastinal mass or adenopathy is noted. Lungs/Pleura: No pneumothorax or pleural effusion is noted. No acute pulmonary disease is noted. Upper Abdomen: No significant abnormality seen in the visualized portion of upper abdomen. Musculoskeletal: Anterior osteophyte formation is noted in the lower thoracic spine. Review of the MIP images confirms the above findings. IMPRESSION: No evidence of pulmonary embolus. Aortic atherosclerosis. No acute abnormality seen in the chest. Electronically Signed   By: Marijo Conception, M.D.   On: 09/28/2016 13:39    Assessment & Plan:   Christop was seen today for anemia, hypertension and hypothyroidism.  Diagnoses and all orders for this visit:  Essential hypertension, benign- his blood pressure is well-controlled, electrolytes and renal function are normal. -  Basic metabolic panel; Future  Hypothyroidism due to medication- his TSH is suppressed so I've asked him to lower his levothyroxine dose. -     TSH; Future -     levothyroxine (SYNTHROID, LEVOTHROID) 125 MCG tablet; Take 1 tablet (125 mcg total) by mouth daily.  Deficiency anemia- his H&H are normal now and there is no evidence of vitamin deficiency, I've asked him to see GI as he is due for lower endoscopy to screen for colon cancer and probably needs upper endoscopy to screen for upper GI pathology as it relates to his symptoms. -     CBC with Differential/Platelet; Future -     IBC panel; Future -     Ferritin; Future -     Folate; Future -     Vitamin B1; Future -     Vitamin B12; Future -     Ambulatory referral to Gastroenterology  BPH associated with nocturia- his symptoms are well controlled with Flomax. -     PSA; Future  PSA  elevation- his PSA is normal now some not concerned about prostate cancer. -     PSA; Future  Prediabetes- his A1c is up to 6.3%, he does not need medical therapy that he does agree to work on his lifestyle modifications. -     Hemoglobin A1c; Future  Gastroesophageal reflux disease with esophagitis -     Ambulatory referral to Gastroenterology  Colon cancer screening -     Ambulatory referral to Gastroenterology  Odynophagia -     Ambulatory referral to Gastroenterology   I have discontinued Ronald Bond levothyroxine. I am also having him start on levothyroxine. Additionally, I am having him maintain his aspirin, mometasone-formoterol, losartan, HYDROcodone-acetaminophen, amLODipine, oxybutynin, olopatadine, cromolyn, cyclobenzaprine, tamsulosin, atorvastatin, predniSONE, and gabapentin.  Meds ordered this encounter  Medications  . levothyroxine (SYNTHROID, LEVOTHROID) 125 MCG tablet    Sig: Take 1 tablet (125 mcg total) by mouth daily.    Dispense:  90 tablet    Refill:  0     Follow-up: Return in about 4 months (around 10/02/2017).  Scarlette Calico, MD

## 2017-06-01 NOTE — Patient Instructions (Signed)
Hypothyroidism Hypothyroidism is a disorder of the thyroid. The thyroid is a large gland that is located in the lower front of the neck. The thyroid releases hormones that control how the body works. With hypothyroidism, the thyroid does not make enough of these hormones. What are the causes? Causes of hypothyroidism may include:  Viral infections.  Pregnancy.  Your own defense system (immune system) attacking your thyroid.  Certain medicines.  Birth defects.  Past radiation treatments to your head or neck.  Past treatment with radioactive iodine.  Past surgical removal of part or all of your thyroid.  Problems with the gland that is located in the center of your brain (pituitary).  What are the signs or symptoms? Signs and symptoms of hypothyroidism may include:  Feeling as though you have no energy (lethargy).  Inability to tolerate cold.  Weight gain that is not explained by a change in diet or exercise habits.  Dry skin.  Coarse hair.  Menstrual irregularity.  Slowing of thought processes.  Constipation.  Sadness or depression.  How is this diagnosed? Your health care provider may diagnose hypothyroidism with blood tests and ultrasound tests. How is this treated? Hypothyroidism is treated with medicine that replaces the hormones that your body does not make. After you begin treatment, it may take several weeks for symptoms to go away. Follow these instructions at home:  Take medicines only as directed by your health care provider.  If you start taking any new medicines, tell your health care provider.  Keep all follow-up visits as directed by your health care provider. This is important. As your condition improves, your dosage needs may change. You will need to have blood tests regularly so that your health care provider can watch your condition. Contact a health care provider if:  Your symptoms do not get better with treatment.  You are taking thyroid  replacement medicine and: ? You sweat excessively. ? You have tremors. ? You feel anxious. ? You lose weight rapidly. ? You cannot tolerate heat. ? You have emotional swings. ? You have diarrhea. ? You feel weak. Get help right away if:  You develop chest pain.  You develop an irregular heartbeat.  You develop a rapid heartbeat. This information is not intended to replace advice given to you by your health care provider. Make sure you discuss any questions you have with your health care provider. Document Released: 11/14/2005 Document Revised: 04/21/2016 Document Reviewed: 04/01/2014 Elsevier Interactive Patient Education  2017 Elsevier Inc.  

## 2017-06-02 ENCOUNTER — Encounter: Payer: Self-pay | Admitting: Internal Medicine

## 2017-06-02 MED ORDER — LEVOTHYROXINE SODIUM 125 MCG PO TABS: 125.0000 ug | ORAL_TABLET | Freq: Every day | ORAL | 0 refills | Status: DC

## 2017-06-02 MED FILL — LEVOTHYROXINE 125 MCG TAB: 125 | 90 days supply | Qty: 90 | Fill #0

## 2017-06-04 DIAGNOSIS — E66811 Obesity, class 1: Secondary | ICD-10-CM | POA: Insufficient documentation

## 2017-06-04 DIAGNOSIS — E669 Obesity, unspecified: Secondary | ICD-10-CM | POA: Insufficient documentation

## 2017-06-04 NOTE — Assessment & Plan Note (Signed)
He agrees to work on his lifestyle modifications to lose weight. 

## 2017-06-08 ENCOUNTER — Encounter: Payer: Self-pay | Admitting: Internal Medicine

## 2017-06-08 ENCOUNTER — Other Ambulatory Visit: Payer: Self-pay | Admitting: Internal Medicine

## 2017-06-08 DIAGNOSIS — E519 Thiamine deficiency, unspecified: Secondary | ICD-10-CM | POA: Insufficient documentation

## 2017-06-08 LAB — VITAMIN B1: Vitamin B1 (Thiamine): 9 nmol/L (ref 8–30)

## 2017-06-08 MED ORDER — VITAMIN B-1 50 MG PO TABS: 50.0000 mg | ORAL_TABLET | Freq: Every day | ORAL | 1 refills | Status: DC

## 2017-06-08 MED FILL — VITAMIN B-1 50 MG TABLET: 50 | 100 days supply | Qty: 100 | Fill #0

## 2017-07-07 MED FILL — TAMSULOSIN HCL 0.4 MG CAP: 0.4 | 90 days supply | Qty: 90 | Fill #0 | Status: TO

## 2017-08-17 ENCOUNTER — Other Ambulatory Visit: Payer: Self-pay | Admitting: Internal Medicine

## 2017-08-17 DIAGNOSIS — E032 Hypothyroidism due to medicaments and other exogenous substances: Secondary | ICD-10-CM

## 2017-08-17 MED FILL — LEVOTHYROXINE 125 MCG TAB: 125 | 90 days supply | Qty: 90 | Fill #0

## 2017-08-17 MED FILL — OXYBUTYNIN CHLORIDE 5 MG TA: 5 | 30 days supply | Qty: 60 | Fill #5

## 2017-08-17 MED FILL — ATORVASTATIN 40 MG TABLET: 40 | 90 days supply | Qty: 90 | Fill #0

## 2017-09-07 ENCOUNTER — Ambulatory Visit (INDEPENDENT_AMBULATORY_CARE_PROVIDER_SITE_OTHER): Payer: MEDICARE | Admitting: Family Medicine

## 2017-09-07 ENCOUNTER — Ambulatory Visit (INDEPENDENT_AMBULATORY_CARE_PROVIDER_SITE_OTHER): Payer: MEDICARE | Admitting: General Practice

## 2017-09-07 ENCOUNTER — Encounter: Payer: Self-pay | Admitting: Family Medicine

## 2017-09-07 DIAGNOSIS — K2100 Gastro-esophageal reflux disease with esophagitis, without bleeding: Secondary | ICD-10-CM

## 2017-09-07 DIAGNOSIS — Z23 Encounter for immunization: Secondary | ICD-10-CM

## 2017-09-07 MED ORDER — OMEPRAZOLE 20 MG PO CPDR: 20.0000 mg | DELAYED_RELEASE_CAPSULE | Freq: Every day | ORAL | 0 refills | Status: DC

## 2017-09-07 NOTE — Assessment & Plan Note (Addendum)
It seems that his symptoms are most likely related to reflux. He has excessive belching and it improves with positional changes. His hemoglobin A1c is at 6.3 upon the last test. Unlikely to be associated with gastroparesis. - Initiated omeprazole 20 mg - Counseled on when to seek immediate care with chest pain - Advised to follow-up if no improvement. Mediator refer to cardiology for stress test versus GI for EGD

## 2017-09-07 NOTE — Patient Instructions (Signed)
Thank you for coming in,   Please let us know if you don't have any improvement.     Please feel free to call with any questions or concerns at any time, at [phone]. --Dr. Raeford Razor  Diet Recommendations  Starchy (carb) foods include: Bread, rice, pasta, potatoes, corn, crackers, bagels, muffins, all baked goods.   Protein foods include: Meat, fish, poultry, eggs, dairy foods, and beans such as pinto and kidney beans (beans also provide carbohydrate).   1. Eat at least 3 meals and 1-2 snacks per day. Never go more than 4-5 hours while awake without eating.  2. Limit starchy foods to TWO per meal and ONE per snack. ONE portion of a starchy  food is equal to the following:   - ONE slice of bread (or its equivalent, such as half of a hamburger bun).   - 1/2 cup of a "scoopable" starchy food such as potatoes or rice.   - 1 OUNCE (28 grams) of starchy snack foods such as crackers or pretzels (look on label).   - 15 grams of carbohydrate as shown on food label.  3. Both lunch and dinner should include a protein food, a carb food, and vegetables.   - Obtain twice as many veg's as protein or carbohydrate foods for both lunch and dinner.   - Try to keep frozen veg's on hand for a quick vegetable serving.     - Fresh or frozen veg's are best.  4. Breakfast should always include protein.

## 2017-09-07 NOTE — Progress Notes (Signed)
Ronald Bond - 71 y.o. male MRN [id number]  Date of birth: 13-Mar-1946  SUBJECTIVE:  Including CC & ROS.  Chief Complaint  Patient presents with  . Heartburn    wakes him up during the middle of night, started last week,belching-denies diet change  . Dizziness    started at the same time as heartburn    Ronald Bond 71 year old male that is presenting with central located chest pain. He reports it has been intermittent for the past week. Around 6:30 at night and then lies down shortly after that. He has been waking up the middle lying with his chest pain and has excessive belching associated with it. He asked 3 position himself in the chest pain seems to resolve. He takes Prilosec over-the-counter with no change in his symptoms. He denies any diaphoresis, nausea, or any radiation to his left shoulder. He has not history of any cardiac disease.   Review of his blood work from 06/01/17 shows a serum creatinine at 1.47. An echo from 2011 shows mild left ventricular hypertrophy with ejection fraction of 55-60% and moderate diastolic dysfunction. CT angios the chest from 2017 that showed atherosclerosis of the thoracic aorta without aneurysm or dissection.  Review of Systems  Constitutional: Negative for fever.  HENT: Negative for sore throat.   Respiratory: Negative for cough.   Cardiovascular: Positive for chest pain.  Gastrointestinal: Negative for abdominal pain.    HISTORY: Past Medical, Surgical, Social, and Family History Reviewed & Updated per EMR.   Pertinent Historical Findings include:  Past Medical History:  Diagnosis Date  . GERD (gastroesophageal reflux disease)   . HTN (hypertension)   . Hyperlipidemia   . Hypothyroidism   . Osteoarthritis   . Wears glasses     Past Surgical History:  Procedure Laterality Date  . COLONOSCOPY    . EYE SURGERY     both cataracts  . MICROLARYNGOSCOPY Left 11/18/2013   Procedure: MICROLARYNGOSCOPY WITH REMOVAL OF GRANULOMA LEFT SIDE;   Surgeon: Izora Gala, MD;  Location: [facility];  Service: ENT;  Laterality: Left;  . THYROIDECTOMY  1974  . TONSILLECTOMY    . TOTAL KNEE ARTHROPLASTY  2009   left    Allergies  Allergen Reactions  . Lisinopril Other (See Comments)    edema    Family History  Problem Relation Age of Onset  . Brain cancer Mother   . Heart disease Father   . Heart disease Brother      Social History   Social History  . Marital status: Married    Spouse name: N/A  . Number of children: N/A  . Years of education: N/A   Occupational History  . Retired Disabled   Social History Main Topics  . Smoking status: Never Smoker  . Smokeless tobacco: Never Used  . Alcohol use No  . Drug use: No  . Sexual activity: Yes   Other Topics Concern  . Not on file   Social History Narrative   Regular Exercise -  YES           PHYSICAL EXAM:  VS: BP (!) 142/78 (BP Location: Left Arm, Patient Position: Sitting, Cuff Size: Normal)   Pulse 84   Temp 98.4 F (36.9 C) (Oral)   Ht 5\' 10"  (1.778 m)   Wt 216 lb (98 kg)   SpO2 99%   BMI 30.99 kg/m  Physical Exam Gen: NAD, alert, cooperative with exam, well-appearing ENT: normal lips, normal nasal mucosa,  Eye: normal  EOM, normal conjunctiva and lids CV:  no edema, +2 pedal pulses, S1-S2, regular rate and rhythm   Resp: no accessory muscle use, non-labored, clear to auscultation bilaterally, no crackles or wheezes Skin: no rashes, no areas of induration  Neuro: normal tone, normal sensation to touch Psych:  normal insight, alert and oriented MSK: Normal gait, normal strength, no tenderness to palpation over the sternum or left chest.      ASSESSMENT & PLAN:   GERD It seems that his symptoms are most likely related to reflux. He has excessive belching and it improves with positional changes. His hemoglobin A1c is at 6.3 upon the last test. Unlikely to be associated with gastroparesis. - Initiated omeprazole 20 mg - Counseled  on when to seek immediate care with chest pain - Advised to follow-up if no improvement. Mediator refer to cardiology for stress test versus GI for EGD

## 2017-09-29 ENCOUNTER — Other Ambulatory Visit: Payer: Self-pay | Admitting: Internal Medicine

## 2017-09-29 MED FILL — AMLODIPINE BESYLATE 10 MG T: 10 | 90 days supply | Qty: 90 | Fill #2

## 2017-10-02 MED FILL — LOSARTAN POTASSIUM 100 MG T: 100 | 90 days supply | Qty: 90 | Fill #0

## 2017-10-03 MED FILL — OMEPRAZOLE 20 MG CAP: 20 | 30 days supply | Qty: 30 | Fill #0

## 2017-10-05 MED FILL — OXYBUTYNIN CHLORIDE 5 MG TA: 5 | 30 days supply | Qty: 60 | Fill #0

## 2017-10-16 DIAGNOSIS — Z961 Presence of intraocular lens: Secondary | ICD-10-CM | POA: Diagnosis not present

## 2017-10-16 DIAGNOSIS — H10413 Chronic giant papillary conjunctivitis, bilateral: Secondary | ICD-10-CM | POA: Diagnosis not present

## 2017-10-16 DIAGNOSIS — H11153 Pinguecula, bilateral: Secondary | ICD-10-CM | POA: Diagnosis not present

## 2017-10-16 MED FILL — CROMOLYN 4% EYE DROPS: 4 | 25 days supply | Qty: 10 | Fill #0

## 2017-10-16 MED FILL — PREDNISOLONE AC 1% EYE DROP: 1 | 33 days supply | Qty: 10 | Fill #0

## 2017-11-02 ENCOUNTER — Other Ambulatory Visit: Payer: Self-pay | Admitting: Family Medicine

## 2017-11-02 ENCOUNTER — Other Ambulatory Visit: Payer: Self-pay | Admitting: Internal Medicine

## 2017-11-02 DIAGNOSIS — N401 Enlarged prostate with lower urinary tract symptoms: Secondary | ICD-10-CM

## 2017-11-02 DIAGNOSIS — R351 Nocturia: Principal | ICD-10-CM

## 2017-11-02 DIAGNOSIS — N410 Acute prostatitis: Secondary | ICD-10-CM

## 2017-11-02 MED FILL — OMEPRAZOLE 20 MG CAP: 20 | 30 days supply | Qty: 30 | Fill #0

## 2017-11-02 MED FILL — TAMSULOSIN HCL 0.4 MG CAP: 0.4 | 90 days supply | Qty: 90 | Fill #0

## 2017-11-13 DIAGNOSIS — H04123 Dry eye syndrome of bilateral lacrimal glands: Secondary | ICD-10-CM | POA: Diagnosis not present

## 2017-11-13 DIAGNOSIS — H01021 Squamous blepharitis right upper eyelid: Secondary | ICD-10-CM | POA: Diagnosis not present

## 2017-11-13 DIAGNOSIS — Z961 Presence of intraocular lens: Secondary | ICD-10-CM | POA: Diagnosis not present

## 2017-11-13 DIAGNOSIS — H01025 Squamous blepharitis left lower eyelid: Secondary | ICD-10-CM | POA: Diagnosis not present

## 2017-11-13 DIAGNOSIS — H04213 Epiphora due to excess lacrimation, bilateral lacrimal glands: Secondary | ICD-10-CM | POA: Diagnosis not present

## 2017-11-13 DIAGNOSIS — H01022 Squamous blepharitis right lower eyelid: Secondary | ICD-10-CM | POA: Diagnosis not present

## 2017-11-13 DIAGNOSIS — H01024 Squamous blepharitis left upper eyelid: Secondary | ICD-10-CM | POA: Diagnosis not present

## 2017-11-15 MED FILL — OXYBUTYNIN CHLORIDE 5 MG TA: 5 | 30 days supply | Qty: 60 | Fill #1

## 2017-11-16 DIAGNOSIS — K219 Gastro-esophageal reflux disease without esophagitis: Secondary | ICD-10-CM | POA: Diagnosis not present

## 2017-11-16 DIAGNOSIS — H04203 Unspecified epiphora, bilateral lacrimal glands: Secondary | ICD-10-CM | POA: Diagnosis not present

## 2017-11-23 DIAGNOSIS — N401 Enlarged prostate with lower urinary tract symptoms: Secondary | ICD-10-CM | POA: Diagnosis not present

## 2017-11-23 DIAGNOSIS — R3915 Urgency of urination: Secondary | ICD-10-CM | POA: Diagnosis not present

## 2017-11-23 DIAGNOSIS — R351 Nocturia: Secondary | ICD-10-CM | POA: Diagnosis not present

## 2017-11-29 NOTE — Progress Notes (Addendum)
Subjective:   Ronald Bond is a 72 y.o. male who presents for Medicare Annual/Subsequent preventive examination.  Review of Systems:  No ROS.  Medicare Wellness Visit. Additional risk factors are reflected in the social history.    Sleep patterns: feels rested on waking, gets up 3 times nightly to void and sleeps 6-8 hours nightly.    Home Safety/Smoke Alarms: Feels safe in home. Smoke alarms in place.  Living environment; residence and Firearm Safety: 2-story house, no firearms. Lives wife, no needs for DME, good support system Seat Belt Safety/Bike Helmet: Wears seat belt.     Objective:    Vitals: There were no vitals taken for this visit.  There is no height or weight on file to calculate BMI.  Advanced Directives 11/29/2016 11/18/2013 11/12/2013  Does Patient Have a Medical Advance Directive? No - Patient does not have advance directive;Patient would like information  Would patient like information on creating a medical advance directive? Yes (MAU/Ambulatory/Procedural Areas - Information given) - -  Pre-existing out of facility DNR order (yellow form or pink MOST form) - No -    Tobacco Social History   Tobacco Use  Smoking Status Never Smoker  Smokeless Tobacco Never Used     Counseling given: Not Answered  Past Medical History:  Diagnosis Date  . GERD (gastroesophageal reflux disease)   . HTN (hypertension)   . Hyperlipidemia   . Hypothyroidism   . Osteoarthritis   . Wears glasses    Past Surgical History:  Procedure Laterality Date  . COLONOSCOPY    . EYE SURGERY     both cataracts  . MICROLARYNGOSCOPY Left 11/18/2013   Procedure: MICROLARYNGOSCOPY WITH REMOVAL OF GRANULOMA LEFT SIDE;  Surgeon: Izora Gala, MD;  Location: [facility];  Service: ENT;  Laterality: Left;  . THYROIDECTOMY  1974  . TONSILLECTOMY    . TOTAL KNEE ARTHROPLASTY  2009   left   Family History  Problem Relation Age of Onset  . Brain cancer Mother   . Heart  disease Father   . Heart disease Brother    Social History   Socioeconomic History  . Marital status: Married    Spouse name: Not on file  . Number of children: Not on file  . Years of education: Not on file  . Highest education level: Not on file  Social Needs  . Financial resource strain: Not on file  . Food insecurity - worry: Not on file  . Food insecurity - inability: Not on file  . Transportation needs - medical: Not on file  . Transportation needs - non-medical: Not on file  Occupational History  . Occupation: Retired    Fish farm manager: DISABLED  Tobacco Use  . Smoking status: Never Smoker  . Smokeless tobacco: Never Used  Substance and Sexual Activity  . Alcohol use: No  . Drug use: No  . Sexual activity: Yes  Other Topics Concern  . Not on file  Social History Narrative   Regular Exercise -  YES          Outpatient Encounter Medications as of 11/30/2017  Medication Sig  . amLODipine (NORVASC) 10 MG tablet Take 1 tablet (10 mg total) by mouth daily.  Marland Kitchen aspirin 81 MG EC tablet Take 81 mg by mouth daily.    Marland Kitchen atorvastatin (LIPITOR) 40 MG tablet TAKE 1 TABLET BY MOUTH DAILY.  . cromolyn (OPTICROM) 4 % ophthalmic solution   . levothyroxine (SYNTHROID, LEVOTHROID) 125 MCG tablet TAKE 1 TABLET (  125 MCG TOTAL) BY MOUTH DAILY.  Marland Kitchen losartan (COZAAR) 100 MG tablet TAKE 1 TABLET BY MOUTH DAILY  . mometasone-formoterol (DULERA) 100-5 MCG/ACT AERO Inhale 2 puffs into the lungs 2 (two) times daily.  Marland Kitchen olopatadine (PATANOL) 0.1 % ophthalmic solution   . omeprazole (PRILOSEC) 20 MG capsule Take 1 capsule (20 mg total) by mouth daily.  Marland Kitchen oxybutynin (DITROPAN) 5 MG tablet   . tamsulosin (FLOMAX) 0.4 MG CAPS capsule TAKE 1 CAPSULE (0.4 MG TOTAL) BY MOUTH DAILY AFTER SUPPER.  Marland Kitchen thiamine (VITAMIN B-1) 50 MG tablet Take 1 tablet (50 mg total) by mouth daily.   No facility-administered encounter medications on file as of 11/30/2017.     Activities of Daily Living In your present state of  health, do you have any difficulty performing the following activities: 11/29/2016  Hearing? N  Vision? N  Difficulty concentrating or making decisions? N  Walking or climbing stairs? N  Dressing or bathing? N  Doing errands, shopping? N  Preparing Food and eating ? N  Using the Toilet? N  In the past six months, have you accidently leaked urine? N  Do you have problems with loss of bowel control? N  Managing your Medications? N  Managing your Finances? N  Housekeeping or managing your Housekeeping? N  Some recent data might be hidden    Patient Care Team: Janith Lima, MD as PCP - General Leonie Man, MD as Consulting Physician (Cardiology) Franchot Gallo, MD as Consulting Physician (Urology) Deneise Lever, MD as Consulting Physician (Pulmonary Disease) groat (Dentistry)   Assessment:   This is a routine wellness examination for [place]. Physical assessment deferred to PCP.   Exercise Activities and Dietary recommendations   Diet (meal preparation, eat out, water intake, caffeinated beverages, dairy products, fruits and vegetables): in general, a "healthy" diet     Reviewed heart healthy diet, encouraged patient to increase daily water intake. Diet education was attached to patient's AVS.   Goals    . Weight (lb) < 200 lb (90.7 kg)     Would like to lose 20 pounds by increasing activity and making healthy food choices.        Fall Risk Fall Risk  11/29/2016 04/02/2015 04/25/2013 04/24/2013 04/04/2013  Falls in the past year? No No No No No    Depression Screen PHQ 2/9 Scores 11/29/2016 04/02/2015 04/25/2013 04/24/2013  PHQ - 2 Score 0 0 0 0    Cognitive Function        Immunization History  Administered Date(s) Administered  . Influenza Split 10/24/2011  . Influenza Whole 08/19/2009, 07/23/2010  . Influenza, High Dose Seasonal PF 10/21/2013, 09/27/2016, 09/07/2017  . Influenza,inj,Quad PF,6+ Mos 08/07/2014, 08/25/2015  . Pneumococcal Conjugate-13 08/25/2015   . Pneumococcal Polysaccharide-23 11/28/2004, 03/08/2016  . Td 11/28/2004  . Tdap 08/25/2015  . Zoster 02/24/2011   Screening Tests Health Maintenance  Topic Date Due  . COLONOSCOPY  07/10/2017  . TETANUS/TDAP  08/24/2025  . INFLUENZA VACCINE  Completed  . Hepatitis C Screening  Completed  . PNA vac Low Risk Adult  Completed      Plan:     Scheduled PCP visit to evaluate high blood pressure and discuss needed blood work. Patient will take B/P at home x2 daily and record to bring to upcoming appointment.   Continue doing brain stimulating activities (puzzles, reading, adult coloring books, staying active) to keep memory sharp.   Continue to eat heart healthy diet (full of fruits, vegetables, whole grains,  lean protein, water--limit salt, fat, and sugar intake) and increase physical activity as tolerated.  I have personally reviewed and noted the following in the patient's chart:   . Medical and social history . Use of alcohol, tobacco or illicit drugs  . Current medications and supplements . Functional ability and status . Nutritional status . Physical activity . Advanced directives . List of other physicians . Vitals . Screenings to include cognitive, depression, and falls . Referrals and appointments  In addition, I have reviewed and discussed with patient certain preventive protocols, quality metrics, and best practice recommendations. A written personalized care plan for preventive services as well as general preventive health recommendations were provided to patient.   Medical screening examination/treatment/procedure(s) were performed by non-physician practitioner and as supervising physician I was immediately available for consultation/collaboration. I agree with above. Scarlette Calico, MD   Michiel Cowboy, RN  11/29/2017

## 2017-11-30 ENCOUNTER — Ambulatory Visit (INDEPENDENT_AMBULATORY_CARE_PROVIDER_SITE_OTHER): Payer: MEDICARE | Admitting: *Deleted

## 2017-11-30 VITALS — BP 164/90 | HR 72 | Resp 20 | Ht 70.0 in | Wt 220.0 lb

## 2017-11-30 DIAGNOSIS — Z0000 Encounter for general adult medical examination without abnormal findings: Secondary | ICD-10-CM | POA: Diagnosis not present

## 2017-11-30 NOTE — Patient Instructions (Addendum)
Continue doing brain stimulating activities (puzzles, reading, adult coloring books, staying active) to keep memory sharp.   Continue to eat heart healthy diet (full of fruits, vegetables, whole grains, lean protein, water--limit salt, fat, and sugar intake) and increase physical activity as tolerated.   Ronald Bond , Thank you for taking time to come for your Medicare Wellness Visit. I appreciate your ongoing commitment to your health goals. Please review the following plan we discussed and let me know if I can assist you in the future.   These are the goals we discussed: Goals    . Patient Stated     I want to go back to the [facility] and start walking. I feel I can go and walk every other day. Decrease eating fatty food and drinking sugary drinks.    . Weight (lb) < 200 lb (90.7 kg)     Would like to lose 20 pounds by increasing activity and making healthy food choices.        This is a list of the screening recommended for you and due dates:  Health Maintenance  Topic Date Due  . Colon Cancer Screening  07/10/2017  . Tetanus Vaccine  08/24/2025  . Flu Shot  Completed  .  Hepatitis C: One time screening is recommended by Center for Disease Control  (CDC) for  adults born from [birth year] through 1965.   Completed  . Pneumonia vaccines  Completed    Fat and Cholesterol Restricted Diet Getting too much fat and cholesterol in your diet may cause health problems. Following this diet helps keep your fat and cholesterol at normal levels. This can keep you from getting sick. What types of fat should I choose?  Choose monosaturated and polyunsaturated fats. These are found in foods such as olive oil, canola oil, flaxseeds, walnuts, almonds, and seeds.  Eat more omega-3 fats. Good choices include salmon, mackerel, sardines, tuna, flaxseed oil, and ground flaxseeds.  Limit saturated fats. These are in animal products such as meats, butter, and cream. They can also be in plant products such as palm  oil, palm kernel oil, and coconut oil.  Avoid foods with partially hydrogenated oils in them. These contain trans fats. Examples of foods that have trans fats are stick margarine, some tub margarines, cookies, crackers, and other baked goods. What general guidelines do I need to follow?  Check food labels. Look for the words "trans fat" and "saturated fat."  When preparing a meal: ? Fill half of your plate with vegetables and green salads. ? Fill one fourth of your plate with whole grains. Look for the word "whole" as the first word in the ingredient list. ? Fill one fourth of your plate with lean protein foods.  Eat more foods that have fiber, like apples, carrots, beans, peas, and barley.  Eat more home-cooked foods. Eat less at restaurants and buffets.  Limit or avoid alcohol.  Limit foods high in starch and sugar.  Limit fried foods.  Cook foods without frying them. Baking, boiling, grilling, and broiling are all great options.  Lose weight if you are overweight. Losing even a small amount of weight can help your overall health. It can also help prevent diseases such as diabetes and heart disease. What foods can I eat? Grains Whole grains, such as whole wheat or whole grain breads, crackers, cereals, and pasta. Unsweetened oatmeal, bulgur, barley, quinoa, or brown rice. Corn or whole wheat flour tortillas. Vegetables Fresh or frozen vegetables (raw, steamed, roasted, or grilled).  Green salads. Fruits All fresh, canned (in natural juice), or frozen fruits. Meat and Other Protein Products Ground beef (85% or leaner), grass-fed beef, or beef trimmed of fat. Skinless chicken or [place]. Ground chicken or [place]. Pork trimmed of fat. All fish and seafood. Eggs. Dried beans, peas, or lentils. Unsalted nuts or seeds. Unsalted canned or dry beans. Dairy Low-fat dairy products, such as skim or 1% milk, 2% or reduced-fat cheeses, low-fat ricotta or cottage cheese, or plain low-fat  yogurt. Fats and Oils Tub margarines without trans fats. Light or reduced-fat mayonnaise and salad dressings. Avocado. Olive, canola, sesame, or safflower oils. Natural peanut or almond butter (choose ones without added sugar and oil). The items listed above may not be a complete list of recommended foods or beverages. Contact your dietitian for more options. What foods are not recommended? Grains White bread. White pasta. White rice. Cornbread. Bagels, pastries, and croissants. Crackers that contain trans fat. Vegetables White potatoes. Corn. Creamed or fried vegetables. Vegetables in a cheese sauce. Fruits Dried fruits. Canned fruit in light or heavy syrup. Fruit juice. Meat and Other Protein Products Fatty cuts of meat. Ribs, chicken wings, bacon, sausage, bologna, salami, chitterlings, fatback, hot dogs, bratwurst, and packaged luncheon meats. Liver and organ meats. Dairy Whole or 2% milk, cream, half-and-half, and cream cheese. Whole milk cheeses. Whole-fat or sweetened yogurt. Full-fat cheeses. Nondairy creamers and whipped toppings. Processed cheese, cheese spreads, or cheese curds. Sweets and Desserts Corn syrup, sugars, honey, and molasses. Candy. Jam and jelly. Syrup. Sweetened cereals. Cookies, pies, cakes, donuts, muffins, and ice cream. Fats and Oils Butter, stick margarine, lard, shortening, ghee, or bacon fat. Coconut, palm kernel, or palm oils. Beverages Alcohol. Sweetened drinks (such as sodas, lemonade, and fruit drinks or punches). The items listed above may not be a complete list of foods and beverages to avoid. Contact your dietitian for more information. This information is not intended to replace advice given to you by your health care provider. Make sure you discuss any questions you have with your health care provider. Document Released: 05/15/2012 Document Revised: 07/21/2016 Document Reviewed: 02/13/2014 Elsevier Interactive Patient Education  2018 [place] DASH stands for "Dietary Approaches to Stop Hypertension." The DASH eating plan is a healthy eating plan that has been shown to reduce high blood pressure (hypertension). It may also reduce your risk for type 2 diabetes, heart disease, and stroke. The DASH eating plan may also help with weight loss. What are tips for following this plan? General guidelines  Avoid eating more than 2,300 mg (milligrams) of salt (sodium) a day. If you have hypertension, you may need to reduce your sodium intake to 1,500 mg a day.  Limit alcohol intake to no more than 1 drink a day for nonpregnant women and 2 drinks a day for men. One drink equals 12 oz of beer, 5 oz of Ronald Bond, or 1 oz of hard liquor.  Work with your health care provider to maintain a healthy body weight or to lose weight. Ask what an ideal weight is for you.  Get at least 30 minutes of exercise that causes your heart to beat faster (aerobic exercise) most days of the week. Activities may include walking, swimming, or biking.  Work with your health care provider or diet and nutrition specialist (dietitian) to adjust your eating plan to your individual calorie needs. Reading food labels  Check food labels for the amount of sodium per serving. Choose foods with less than 5  percent of the Daily Value of sodium. Generally, foods with less than 300 mg of sodium per serving fit into this eating plan.  To find whole grains, look for the word "whole" as the first word in the ingredient list. Shopping  Buy products labeled as "low-sodium" or "no salt added."  Buy fresh foods. Avoid canned foods and premade or frozen meals. Cooking  Avoid adding salt when cooking. Use salt-free seasonings or herbs instead of table salt or sea salt. Check with your health care provider or pharmacist before using salt substitutes.  Do not fry foods. Cook foods using healthy methods such as baking, boiling, grilling, and broiling instead.  Cook  with heart-healthy oils, such as olive, canola, soybean, or sunflower oil. Meal planning   Eat a balanced diet that includes: ? 5 or more servings of fruits and vegetables each day. At each meal, try to fill half of your plate with fruits and vegetables. ? Up to 6-8 servings of whole grains each day. ? Less than 6 oz of lean meat, poultry, or fish each day. A 3-oz serving of meat is about the same size as a deck of cards. One egg equals 1 oz. ? 2 servings of low-fat dairy each day. ? A serving of nuts, seeds, or beans 5 times each week. ? Heart-healthy fats. Healthy fats called Omega-3 fatty acids are found in foods such as flaxseeds and coldwater fish, like sardines, salmon, and mackerel.  Limit how much you eat of the following: ? Canned or prepackaged foods. ? Food that is high in trans fat, such as fried foods. ? Food that is high in saturated fat, such as fatty meat. ? Sweets, desserts, sugary drinks, and other foods with added sugar. ? Full-fat dairy products.  Do not salt foods before eating.  Try to eat at least 2 vegetarian meals each week.  Eat more home-cooked food and less restaurant, buffet, and fast food.  When eating at a restaurant, ask that your food be prepared with less salt or no salt, if possible. What foods are recommended? The items listed may not be a complete list. Talk with your dietitian about what dietary choices are best for you. Grains Whole-grain or whole-wheat bread. Whole-grain or whole-wheat pasta. Brown rice. Ronald Bond. Bulgur. Whole-grain and low-sodium cereals. Pita bread. Low-fat, low-sodium crackers. Whole-wheat flour tortillas. Vegetables Fresh or frozen vegetables (raw, steamed, roasted, or grilled). Low-sodium or reduced-sodium tomato and vegetable juice. Low-sodium or reduced-sodium tomato sauce and tomato paste. Low-sodium or reduced-sodium canned vegetables. Fruits All fresh, dried, or frozen fruit. Canned fruit in natural juice  (without added sugar). Meat and other protein foods Skinless chicken or [place]. Ground chicken or [place]. Pork with fat trimmed off. Fish and seafood. Egg whites. Dried beans, peas, or lentils. Unsalted nuts, nut butters, and seeds. Unsalted canned beans. Lean cuts of beef with fat trimmed off. Low-sodium, lean deli meat. Dairy Low-fat (1%) or fat-free (skim) milk. Fat-free, low-fat, or reduced-fat cheeses. Nonfat, low-sodium ricotta or cottage cheese. Low-fat or nonfat yogurt. Low-fat, low-sodium cheese. Fats and oils Soft margarine without trans fats. Vegetable oil. Low-fat, reduced-fat, or light mayonnaise and salad dressings (reduced-sodium). Canola, safflower, olive, soybean, and sunflower oils. Avocado. Seasoning and other foods Herbs. Spices. Seasoning mixes without salt. Unsalted popcorn and pretzels. Fat-free sweets. What foods are not recommended? The items listed may not be a complete list. Talk with your dietitian about what dietary choices are best for you. Grains Baked goods made with fat, such as  croissants, muffins, or some breads. Dry pasta or rice meal packs. Vegetables Creamed or fried vegetables. Vegetables in a cheese sauce. Regular canned vegetables (not low-sodium or reduced-sodium). Regular canned tomato sauce and paste (not low-sodium or reduced-sodium). Regular tomato and vegetable juice (not low-sodium or reduced-sodium). Ronald Bond. Olives. Fruits Canned fruit in a light or heavy syrup. Fried fruit. Fruit in cream or butter sauce. Meat and other protein foods Fatty cuts of meat. Ribs. Fried meat. Ronald Bond. Sausage. Bologna and other processed lunch meats. Salami. Fatback. Hotdogs. Bratwurst. Salted nuts and seeds. Canned beans with added salt. Canned or smoked fish. Whole eggs or egg yolks. Chicken or [place] with skin. Dairy Whole or 2% milk, cream, and half-and-half. Whole or full-fat cream cheese. Whole-fat or sweetened yogurt. Full-fat cheese. Nondairy creamers. Whipped  toppings. Processed cheese and cheese spreads. Fats and oils Butter. Stick margarine. Lard. Shortening. Ghee. Bacon fat. Tropical oils, such as coconut, palm kernel, or palm oil. Seasoning and other foods Salted popcorn and pretzels. Onion salt, garlic salt, seasoned salt, table salt, and sea salt. Worcestershire sauce. Tartar sauce. Barbecue sauce. Teriyaki sauce. Soy sauce, including reduced-sodium. Steak sauce. Canned and packaged gravies. Fish sauce. Oyster sauce. Cocktail sauce. Horseradish that you find on the shelf. Ketchup. Mustard. Meat flavorings and tenderizers. Bouillon cubes. Hot sauce and Tabasco sauce. Premade or packaged marinades. Premade or packaged taco seasonings. Relishes. Regular salad dressings. Where to find more information:  National Heart, Lung, and [place]: https://wilson-eaton.com/  American Heart Association: www.heart.org Summary  The DASH eating plan is a healthy eating plan that has been shown to reduce high blood pressure (hypertension). It may also reduce your risk for type 2 diabetes, heart disease, and stroke.  With the DASH eating plan, you should limit salt (sodium) intake to 2,300 mg a day. If you have hypertension, you may need to reduce your sodium intake to 1,500 mg a day.  When on the DASH eating plan, aim to eat more fresh fruits and vegetables, whole grains, lean proteins, low-fat dairy, and heart-healthy fats.  Work with your health care provider or diet and nutrition specialist (dietitian) to adjust your eating plan to your individual calorie needs. This information is not intended to replace advice given to you by your health care provider. Make sure you discuss any questions you have with your health care provider. Document Released: 11/03/2011 Document Revised: 11/07/2016 Document Reviewed: 11/07/2016 Elsevier Interactive Patient Education  [employer].

## 2017-12-01 ENCOUNTER — Other Ambulatory Visit: Payer: Self-pay | Admitting: Family Medicine

## 2017-12-01 ENCOUNTER — Other Ambulatory Visit: Payer: Self-pay | Admitting: Internal Medicine

## 2017-12-01 DIAGNOSIS — E032 Hypothyroidism due to medicaments and other exogenous substances: Secondary | ICD-10-CM

## 2017-12-01 MED FILL — LEVOTHYROXINE 125 MCG TAB: 125 | 90 days supply | Qty: 90 | Fill #0

## 2017-12-04 MED FILL — OMEPRAZOLE 20 MG CAP: 20 | 30 days supply | Qty: 30 | Fill #0

## 2017-12-06 ENCOUNTER — Encounter: Payer: Self-pay | Admitting: Internal Medicine

## 2017-12-06 ENCOUNTER — Ambulatory Visit (INDEPENDENT_AMBULATORY_CARE_PROVIDER_SITE_OTHER): Payer: MEDICARE | Admitting: Internal Medicine

## 2017-12-06 DIAGNOSIS — R7303 Prediabetes: Secondary | ICD-10-CM

## 2017-12-06 DIAGNOSIS — E039 Hypothyroidism, unspecified: Secondary | ICD-10-CM

## 2017-12-06 DIAGNOSIS — I10 Essential (primary) hypertension: Secondary | ICD-10-CM

## 2017-12-06 DIAGNOSIS — K219 Gastro-esophageal reflux disease without esophagitis: Secondary | ICD-10-CM

## 2017-12-06 DIAGNOSIS — E785 Hyperlipidemia, unspecified: Secondary | ICD-10-CM

## 2017-12-06 NOTE — Progress Notes (Signed)
Subjective:  Patient ID: Ronald Bond, male    DOB: 03-09-1946  Age: 72 y.o. MRN: [id number]  CC: No chief complaint on file.   HPI Ronald Bond presents for not seen, he left  Outpatient Medications Prior to Visit  Medication Sig Dispense Refill  . amLODipine (NORVASC) 10 MG tablet Take 1 tablet (10 mg total) by mouth daily. 90 tablet 3  . aspirin 81 MG EC tablet Take 81 mg by mouth daily.      Marland Kitchen atorvastatin (LIPITOR) 40 MG tablet TAKE 1 TABLET BY MOUTH DAILY. 90 tablet 0  . cromolyn (OPTICROM) 4 % ophthalmic solution     . levothyroxine (SYNTHROID, LEVOTHROID) 125 MCG tablet Take 1 tablet (125 mcg total) by mouth daily. 90 tablet 1  . losartan (COZAAR) 100 MG tablet TAKE 1 TABLET BY MOUTH DAILY 90 tablet 0  . mometasone-formoterol (DULERA) 100-5 MCG/ACT AERO Inhale 2 puffs into the lungs 2 (two) times daily. 13 g 11  . Multiple Vitamin (MULTIVITAMIN) tablet Take 1 tablet by mouth daily.    Marland Kitchen omeprazole (PRILOSEC) 20 MG capsule Take 1 capsule (20 mg total) by mouth daily. 30 capsule 0  . oxybutynin (DITROPAN) 5 MG tablet     . tamsulosin (FLOMAX) 0.4 MG CAPS capsule TAKE 1 CAPSULE (0.4 MG TOTAL) BY MOUTH DAILY AFTER SUPPER. 90 capsule 1  . thiamine (VITAMIN B-1) 50 MG tablet Take 1 tablet (50 mg total) by mouth daily. 90 tablet 1   No facility-administered medications prior to visit.     ROS Review of Systems  Objective:  There were no vitals taken for this visit.  BP Readings from Last 3 Encounters:  11/30/17 (!) 164/90  09/07/17 (!) 142/78  06/01/17 140/80    Wt Readings from Last 3 Encounters:  11/30/17 220 lb (99.8 kg)  09/07/17 216 lb (98 kg)  06/01/17 213 lb (96.6 kg)    Physical Exam  Lab Results  Component Value Date   WBC 16.0 (H) 06/01/2017   HGB 13.0 06/01/2017   HCT 39.4 06/01/2017   PLT 291.0 06/01/2017   GLUCOSE 141 (H) 06/01/2017   CHOL 149 09/27/2016   TRIG 188.0 (H) 09/27/2016   HDL 58.60 09/27/2016   LDLDIRECT 75.0 03/08/2016   LDLCALC 53 09/27/2016   ALT 14 09/27/2016   AST 10 09/27/2016   NA 136 06/01/2017   K 3.7 06/01/2017   CL 100 06/01/2017   CREATININE 1.47 06/01/2017   BUN 23 06/01/2017   CO2 27 06/01/2017   TSH 0.10 (L) 06/01/2017   PSA 2.00 06/01/2017   INR 0.9 10/17/2007   HGBA1C 6.3 06/01/2017    Ct Angio Chest Pe W Or Wo Contrast  Result Date: 09/28/2016 CLINICAL DATA:  Shortness of breath.  Elevated D-dimer level. EXAM: CT ANGIOGRAPHY CHEST WITH CONTRAST TECHNIQUE: Multidetector CT imaging of the chest was performed using the standard protocol during bolus administration of intravenous contrast. Multiplanar CT image reconstructions and MIPs were obtained to evaluate the vascular anatomy. CONTRAST:  80 mL of Isovue 370 intravenously. COMPARISON:  None. FINDINGS: Cardiovascular: Atherosclerosis of thoracic aorta is noted without aneurysm or dissection. There is no evidence of pulmonary embolus. Mediastinum/Nodes: No mediastinal mass or adenopathy is noted. Lungs/Pleura: No pneumothorax or pleural effusion is noted. No acute pulmonary disease is noted. Upper Abdomen: No significant abnormality seen in the visualized portion of upper abdomen. Musculoskeletal: Anterior osteophyte formation is noted in the lower thoracic spine. Review of the MIP images confirms the above findings.  IMPRESSION: No evidence of pulmonary embolus. Aortic atherosclerosis. No acute abnormality seen in the chest. Electronically Signed   By: Marijo Conception, M.D.   On: 09/28/2016 13:39    Assessment & Plan:   Diagnoses and all orders for this visit:  Acquired hypothyroidism -     TSH; Future  Prediabetes -     Hemoglobin A1c; Future  Hyperlipidemia with target LDL less than 100 -     Comprehensive metabolic panel; Future  Gastroesophageal reflux disease without esophagitis -     CBC with Differential/Platelet; Future -     Lipid panel; Future  Essential hypertension, benign -     Comprehensive metabolic panel; Future -      Lipid panel; Future   I am having [name] maintain his aspirin, mometasone-formoterol, amLODipine, oxybutynin, cromolyn, thiamine, atorvastatin, losartan, tamsulosin, multivitamin, levothyroxine, and omeprazole.  No orders of the defined types were placed in this encounter.    Follow-up: No Follow-up on file.  Scarlette Calico, MD

## 2017-12-07 ENCOUNTER — Ambulatory Visit (INDEPENDENT_AMBULATORY_CARE_PROVIDER_SITE_OTHER): Payer: MEDICARE | Admitting: Internal Medicine

## 2017-12-07 ENCOUNTER — Encounter: Payer: Self-pay | Admitting: Internal Medicine

## 2017-12-07 ENCOUNTER — Other Ambulatory Visit (INDEPENDENT_AMBULATORY_CARE_PROVIDER_SITE_OTHER): Payer: MEDICARE

## 2017-12-07 VITALS — BP 130/70 | HR 53 | Temp 97.7°F | Ht 70.0 in | Wt 220.1 lb

## 2017-12-07 DIAGNOSIS — R7303 Prediabetes: Secondary | ICD-10-CM

## 2017-12-07 DIAGNOSIS — E039 Hypothyroidism, unspecified: Secondary | ICD-10-CM

## 2017-12-07 DIAGNOSIS — I119 Hypertensive heart disease without heart failure: Secondary | ICD-10-CM

## 2017-12-07 DIAGNOSIS — Z1211 Encounter for screening for malignant neoplasm of colon: Secondary | ICD-10-CM

## 2017-12-07 DIAGNOSIS — K5904 Chronic idiopathic constipation: Secondary | ICD-10-CM | POA: Diagnosis not present

## 2017-12-07 DIAGNOSIS — K219 Gastro-esophageal reflux disease without esophagitis: Secondary | ICD-10-CM | POA: Diagnosis not present

## 2017-12-07 DIAGNOSIS — Z86010 Personal history of colonic polyps: Secondary | ICD-10-CM | POA: Diagnosis not present

## 2017-12-07 DIAGNOSIS — Z0000 Encounter for general adult medical examination without abnormal findings: Secondary | ICD-10-CM | POA: Diagnosis not present

## 2017-12-07 DIAGNOSIS — E785 Hyperlipidemia, unspecified: Secondary | ICD-10-CM

## 2017-12-07 DIAGNOSIS — I10 Essential (primary) hypertension: Secondary | ICD-10-CM | POA: Diagnosis not present

## 2017-12-07 LAB — CBC WITH DIFFERENTIAL/PLATELET
BASOS ABS: 0.1 10*3/uL (ref 0.0–0.1)
Basophils Relative: 0.9 % (ref 0.0–3.0)
Eosinophils Absolute: 0.4 10*3/uL (ref 0.0–0.7)
Eosinophils Relative: 5.2 % — ABNORMAL HIGH (ref 0.0–5.0)
HEMATOCRIT: 38.3 % — AB (ref 39.0–52.0)
Hemoglobin: 12.6 g/dL — ABNORMAL LOW (ref 13.0–17.0)
LYMPHS PCT: 22 % (ref 12.0–46.0)
Lymphs Abs: 1.6 10*3/uL (ref 0.7–4.0)
MCHC: 32.8 g/dL (ref 30.0–36.0)
MCV: 90.8 fl (ref 78.0–100.0)
MONOS PCT: 9.6 % (ref 3.0–12.0)
Monocytes Absolute: 0.7 10*3/uL (ref 0.1–1.0)
Neutro Abs: 4.6 10*3/uL (ref 1.4–7.7)
Neutrophils Relative %: 62.3 % (ref 43.0–77.0)
Platelets: 262 10*3/uL (ref 150.0–400.0)
RBC: 4.22 Mil/uL (ref 4.22–5.81)
RDW: 14.2 % (ref 11.5–15.5)
WBC: 7.4 10*3/uL (ref 4.0–10.5)

## 2017-12-07 LAB — COMPREHENSIVE METABOLIC PANEL
ALK PHOS: 70 U/L (ref 39–117)
ALT: 13 U/L (ref 0–53)
AST: 11 U/L (ref 0–37)
Albumin: 4.3 g/dL (ref 3.5–5.2)
BILIRUBIN TOTAL: 0.6 mg/dL (ref 0.2–1.2)
BUN: 13 mg/dL (ref 6–23)
CALCIUM: 9.2 mg/dL (ref 8.4–10.5)
CO2: 27 mEq/L (ref 19–32)
Chloride: 104 mEq/L (ref 96–112)
Creatinine, Ser: 1.46 mg/dL (ref 0.40–1.50)
GFR: 61.03 mL/min (ref >60.00)
GLUCOSE: 104 mg/dL — AB (ref 70–99)
Potassium: 3.4 mEq/L — ABNORMAL LOW (ref 3.5–5.1)
Sodium: 139 mEq/L (ref 135–145)
TOTAL PROTEIN: 7.5 g/dL (ref 6.0–8.3)

## 2017-12-07 LAB — LIPID PANEL
Cholesterol: 146 mg/dL (ref 0–200)
HDL: 46 mg/dL (ref >39.00)
LDL Cholesterol: 65 mg/dL (ref 0–99)
NONHDL: 99.84
Total CHOL/HDL Ratio: 3
Triglycerides: 172 mg/dL — ABNORMAL HIGH (ref 0.0–149.0)
VLDL: 34.4 mg/dL (ref 0.0–40.0)

## 2017-12-07 LAB — TSH: TSH: 1.33 u[IU]/mL (ref 0.35–4.50)

## 2017-12-07 LAB — HEMOGLOBIN A1C: Hgb A1c MFr Bld: 6.3 % (ref 4.6–6.5)

## 2017-12-07 MED ORDER — LOSARTAN POTASSIUM 100 MG PO TABS: 100.0000 mg | ORAL_TABLET | Freq: Every day | ORAL | 1 refills | Status: DC

## 2017-12-07 MED ORDER — AMLODIPINE BESYLATE 10 MG PO TABS: 10.0000 mg | ORAL_TABLET | Freq: Every day | ORAL | 1 refills | Status: DC

## 2017-12-07 MED ORDER — ATORVASTATIN CALCIUM 40 MG PO TABS: 40.0000 mg | ORAL_TABLET | Freq: Every day | ORAL | 1 refills | Status: DC

## 2017-12-07 MED FILL — AMLODIPINE BESYLATE 10 MG T: 10 | 30 days supply | Qty: 30 | Fill #0

## 2017-12-07 MED FILL — ATORVASTATIN 40 MG TABLET: 40 | 30 days supply | Qty: 30 | Fill #0

## 2017-12-07 NOTE — Patient Instructions (Signed)

## 2017-12-07 NOTE — Progress Notes (Signed)
Subjective:  Patient ID: Ronald Bond, male    DOB: February 14, 1946  Age: 72 y.o. MRN: [id number]  CC: Hypertension; Hypothyroidism; Hyperlipidemia; and Annual Exam   HPI Ronald Bond presents for a CPX.  He complains of mild constipation and weight gain.  The constipation is adequately treated with over-the-counter remedies.  He is very active and has had no recent episodes of chest pain, shortness of breath, headache, blurred vision, fatigue, edema, or palpitations.  Past Medical History:  Diagnosis Date  . GERD (gastroesophageal reflux disease)   . HTN (hypertension)   . Hyperlipidemia   . Hypothyroidism   . Osteoarthritis   . Wears glasses    Past Surgical History:  Procedure Laterality Date  . COLONOSCOPY    . EYE SURGERY     both cataracts  . MICROLARYNGOSCOPY Left 11/18/2013   Procedure: MICROLARYNGOSCOPY WITH REMOVAL OF GRANULOMA LEFT SIDE;  Surgeon: Izora Gala, MD;  Location: [facility];  Service: ENT;  Laterality: Left;  . THYROIDECTOMY  1974  . TONSILLECTOMY    . TOTAL KNEE ARTHROPLASTY  2009   left    reports that  has never smoked. he has never used smokeless tobacco. He reports that he does not drink alcohol or use drugs. family history includes Brain cancer in his mother; Heart disease in his brother and father. Allergies  Allergen Reactions  . Lisinopril Other (See Comments)    edema    Outpatient Medications Prior to Visit  Medication Sig Dispense Refill  . aspirin 81 MG EC tablet Take 81 mg by mouth daily.      . cromolyn (OPTICROM) 4 % ophthalmic solution     . levothyroxine (SYNTHROID, LEVOTHROID) 125 MCG tablet Take 1 tablet (125 mcg total) by mouth daily. 90 tablet 1  . mometasone-formoterol (DULERA) 100-5 MCG/ACT AERO Inhale 2 puffs into the lungs 2 (two) times daily. 13 g 11  . Multiple Vitamin (MULTIVITAMIN) tablet Take 1 tablet by mouth daily.    Marland Kitchen omeprazole (PRILOSEC) 20 MG capsule Take 1 capsule (20 mg total) by mouth  daily. 30 capsule 0  . oxybutynin (DITROPAN) 5 MG tablet     . tamsulosin (FLOMAX) 0.4 MG CAPS capsule TAKE 1 CAPSULE (0.4 MG TOTAL) BY MOUTH DAILY AFTER SUPPER. 90 capsule 1  . thiamine (VITAMIN B-1) 50 MG tablet Take 1 tablet (50 mg total) by mouth daily. 90 tablet 1  . amLODipine (NORVASC) 10 MG tablet Take 1 tablet (10 mg total) by mouth daily. 90 tablet 3  . atorvastatin (LIPITOR) 40 MG tablet TAKE 1 TABLET BY MOUTH DAILY. 90 tablet 0  . losartan (COZAAR) 100 MG tablet TAKE 1 TABLET BY MOUTH DAILY 90 tablet 0   No facility-administered medications prior to visit.     ROS Review of Systems  Constitutional: Positive for fatigue and unexpected weight change. Negative for activity change, appetite change and diaphoresis.  Eyes: Negative.  Negative for visual disturbance.  Respiratory: Negative for cough, chest tightness and shortness of breath.   Cardiovascular: Negative.  Negative for chest pain, palpitations and leg swelling.  Gastrointestinal: Positive for constipation. Negative for abdominal pain, blood in stool, diarrhea, nausea and vomiting.  Endocrine: Negative.  Negative for cold intolerance and heat intolerance.  Genitourinary: Negative.  Negative for difficulty urinating.  Musculoskeletal: Negative.  Negative for arthralgias and myalgias.  Skin: Negative.  Negative for rash.  Allergic/Immunologic: Negative.   Neurological: Negative.  Negative for dizziness and weakness.  Hematological: Negative for adenopathy. Does  not bruise/bleed easily.  Psychiatric/Behavioral: Negative.     Objective:  BP 130/70 (BP Location: Left Arm, Patient Position: Sitting, Cuff Size: Large)   Pulse (!) 53   Temp 97.7 F (36.5 C) (Oral)   Ht 5\' 10"  (1.778 m)   Wt 220 lb 2 oz (99.8 kg)   SpO2 97%   BMI 31.58 kg/m   BP Readings from Last 3 Encounters:  12/07/17 130/70  11/30/17 (!) 164/90  09/07/17 (!) 142/78    Wt Readings from Last 3 Encounters:  12/07/17 220 lb 2 oz (99.8 kg)    11/30/17 220 lb (99.8 kg)  09/07/17 216 lb (98 kg)    Physical Exam  Constitutional: He is oriented to person, place, and time. No distress.  HENT:  Mouth/Throat: Oropharynx is clear and moist. No oropharyngeal exudate.  Eyes: Conjunctivae are normal. Left eye exhibits no discharge. No scleral icterus.  Neck: Normal range of motion. Neck supple. No JVD present. No thyromegaly present.  Cardiovascular: Normal rate, regular rhythm and normal heart sounds. Exam reveals no gallop.  No murmur heard. Pulmonary/Chest: Effort normal and breath sounds normal. No respiratory distress. He has no wheezes. He has no rales.  Abdominal: Soft. Bowel sounds are normal. He exhibits no distension and no mass. There is no tenderness. There is no guarding.  Musculoskeletal: Normal range of motion. He exhibits no edema or tenderness.  Lymphadenopathy:    He has no cervical adenopathy.  Neurological: He is alert and oriented to person, place, and time.  Skin: Skin is warm and dry. No rash noted. He is not diaphoretic. No erythema. No pallor.  Psychiatric: He has a normal mood and affect. His behavior is normal. Judgment and thought content normal.  Vitals reviewed.   Lab Results  Component Value Date   WBC 7.4 12/07/2017   HGB 12.6 (L) 12/07/2017   HCT 38.3 (L) 12/07/2017   PLT 262.0 12/07/2017   GLUCOSE 104 (H) 12/07/2017   CHOL 146 12/07/2017   TRIG 172.0 (H) 12/07/2017   HDL 46.00 12/07/2017   LDLDIRECT 75.0 03/08/2016   LDLCALC 65 12/07/2017   ALT 13 12/07/2017   AST 11 12/07/2017   NA 139 12/07/2017   K 3.4 (L) 12/07/2017   CL 104 12/07/2017   CREATININE 1.46 12/07/2017   BUN 13 12/07/2017   CO2 27 12/07/2017   TSH 1.33 12/07/2017   PSA 2.00 06/01/2017   INR 0.9 10/17/2007   HGBA1C 6.3 12/07/2017    Ct Angio Chest Pe W Or Wo Contrast  Result Date: 09/28/2016 CLINICAL DATA:  Shortness of breath.  Elevated D-dimer level. EXAM: CT ANGIOGRAPHY CHEST WITH CONTRAST TECHNIQUE:  Multidetector CT imaging of the chest was performed using the standard protocol during bolus administration of intravenous contrast. Multiplanar CT image reconstructions and MIPs were obtained to evaluate the vascular anatomy. CONTRAST:  80 mL of Isovue 370 intravenously. COMPARISON:  None. FINDINGS: Cardiovascular: Atherosclerosis of thoracic aorta is noted without aneurysm or dissection. There is no evidence of pulmonary embolus. Mediastinum/Nodes: No mediastinal mass or adenopathy is noted. Lungs/Pleura: No pneumothorax or pleural effusion is noted. No acute pulmonary disease is noted. Upper Abdomen: No significant abnormality seen in the visualized portion of upper abdomen. Musculoskeletal: Anterior osteophyte formation is noted in the lower thoracic spine. Review of the MIP images confirms the above findings. IMPRESSION: No evidence of pulmonary embolus. Aortic atherosclerosis. No acute abnormality seen in the chest. Electronically Signed   By: Marijo Conception, M.D.   On: 09/28/2016  13:39    Assessment & Plan:   Ronald Bond was seen today for hypertension, hypothyroidism, hyperlipidemia and annual exam.  Diagnoses and all orders for this visit:  Colon cancer screening -     Cologuard  Hyperlipidemia with target LDL less than 100- He has achieved his LDL goal and is doing well on the statin. -     atorvastatin (LIPITOR) 40 MG tablet; Take 1 tablet (40 mg total) by mouth daily.  Prediabetes- His A1c is at 6.3%.  Medical therapy is not indicated.  He agrees to work on his lifestyle modifications.  Acquired hypothyroidism- His TSH is in the normal range.  He will continue the current dose of levothyroxine.  Hypertensive heart disease without CHF (congestive heart failure)- His blood pressure is adequately well controlled and he has a normal volume status today. -     amLODipine (NORVASC) 10 MG tablet; Take 1 tablet (10 mg total) by mouth daily. -     losartan (COZAAR) 100 MG tablet; Take 1 tablet  (100 mg total) by mouth daily.  Routine general medical examination at a health care facility   I have changed Ronald Bond's atorvastatin and losartan. I am also having him maintain his aspirin, mometasone-formoterol, oxybutynin, cromolyn, thiamine, tamsulosin, multivitamin, levothyroxine, omeprazole, and amLODipine.  Meds ordered this encounter  Medications  . atorvastatin (LIPITOR) 40 MG tablet    Sig: Take 1 tablet (40 mg total) by mouth daily.    Dispense:  90 tablet    Refill:  1  . amLODipine (NORVASC) 10 MG tablet    Sig: Take 1 tablet (10 mg total) by mouth daily.    Dispense:  90 tablet    Refill:  1  . losartan (COZAAR) 100 MG tablet    Sig: Take 1 tablet (100 mg total) by mouth daily.    Dispense:  90 tablet    Refill:  1   See AVS for instructions about healthy living and anticipatory guidance.  Follow-up: Return in about 6 months (around 06/06/2018).  Scarlette Calico, MD

## 2017-12-10 ENCOUNTER — Other Ambulatory Visit: Payer: Self-pay | Admitting: Internal Medicine

## 2017-12-10 DIAGNOSIS — E519 Thiamine deficiency, unspecified: Secondary | ICD-10-CM

## 2017-12-10 DIAGNOSIS — D539 Nutritional anemia, unspecified: Secondary | ICD-10-CM

## 2017-12-19 NOTE — Telephone Encounter (Signed)
Copied from [id number] [id number] [id number]. Topic: Inquiry >> Dec 19, 2017 10:07 AM Neva Seat wrote: Pt is requesting more of his medications.  The medications have been misplaced since he has been out of town and will continue to be for 4 weeks.    Amlodipine 10 mg Losartan 100 mg  2 more  Muscle relaxer  Pain for shoulder  [facility] [place] [facility].  He is going to check.

## 2018-01-19 ENCOUNTER — Other Ambulatory Visit: Payer: Self-pay | Admitting: Family Medicine

## 2018-01-19 MED FILL — AMLODIPINE BESYLATE 10 MG T: 10 | 30 days supply | Qty: 30 | Fill #1

## 2018-01-19 MED FILL — ATORVASTATIN 40 MG TABLET: 40 | 30 days supply | Qty: 30 | Fill #1

## 2018-01-19 MED FILL — OMEPRAZOLE 20 MG CAP: 20 | 30 days supply | Qty: 30 | Fill #0

## 2018-02-02 ENCOUNTER — Other Ambulatory Visit (INDEPENDENT_AMBULATORY_CARE_PROVIDER_SITE_OTHER): Payer: MEDICARE

## 2018-02-02 DIAGNOSIS — D539 Nutritional anemia, unspecified: Secondary | ICD-10-CM

## 2018-02-02 DIAGNOSIS — E519 Thiamine deficiency, unspecified: Secondary | ICD-10-CM | POA: Diagnosis not present

## 2018-02-02 LAB — CBC WITH DIFFERENTIAL/PLATELET
BASOS ABS: 0.1 10*3/uL (ref 0.0–0.1)
Basophils Relative: 1 % (ref 0.0–3.0)
EOS ABS: 0.4 10*3/uL (ref 0.0–0.7)
Eosinophils Relative: 5.9 % — ABNORMAL HIGH (ref 0.0–5.0)
HCT: 38.9 % — ABNORMAL LOW (ref 39.0–52.0)
Hemoglobin: 13.1 g/dL (ref 13.0–17.0)
LYMPHS ABS: 1.4 10*3/uL (ref 0.7–4.0)
LYMPHS PCT: 18.1 % (ref 12.0–46.0)
MCHC: 33.6 g/dL (ref 30.0–36.0)
MCV: 89.4 fl (ref 78.0–100.0)
MONO ABS: 0.7 10*3/uL (ref 0.1–1.0)
Monocytes Relative: 9.2 % (ref 3.0–12.0)
NEUTROS PCT: 65.8 % (ref 43.0–77.0)
Neutro Abs: 5 10*3/uL (ref 1.4–7.7)
Platelets: 266 10*3/uL (ref 150.0–400.0)
RBC: 4.35 Mil/uL (ref 4.22–5.81)
RDW: 14.7 % (ref 11.5–15.5)
WBC: 7.6 10*3/uL (ref 4.0–10.5)

## 2018-02-02 LAB — VITAMIN B12: VITAMIN B 12: 1157 pg/mL — AB (ref 211–911)

## 2018-02-02 LAB — IBC PANEL
Iron: 39 ug/dL — ABNORMAL LOW (ref 42–165)
Saturation Ratios: 9.9 % — ABNORMAL LOW (ref 20.0–50.0)
Transferrin: 282 mg/dL (ref 212.0–360.0)

## 2018-02-02 LAB — FERRITIN: FERRITIN: 63.7 ng/mL (ref 22.0–322.0)

## 2018-02-02 LAB — FOLATE: FOLATE: 17.8 ng/mL (ref >5.9)

## 2018-02-04 ENCOUNTER — Encounter: Payer: Self-pay | Admitting: Internal Medicine

## 2018-02-04 ENCOUNTER — Other Ambulatory Visit: Payer: Self-pay | Admitting: Internal Medicine

## 2018-02-04 DIAGNOSIS — D508 Other iron deficiency anemias: Secondary | ICD-10-CM

## 2018-02-04 DIAGNOSIS — Z1211 Encounter for screening for malignant neoplasm of colon: Secondary | ICD-10-CM

## 2018-02-04 MED ORDER — FERROUS SULFATE 325 (65 FE) MG PO TABS: 325.0000 mg | ORAL_TABLET | Freq: Two times a day (BID) | ORAL | 1 refills | Status: DC

## 2018-02-07 ENCOUNTER — Encounter: Payer: Self-pay | Admitting: Internal Medicine

## 2018-02-07 LAB — VITAMIN B1: Vitamin B1 (Thiamine): 16 nmol/L (ref 8–30)

## 2018-02-13 ENCOUNTER — Encounter: Payer: Self-pay | Admitting: Internal Medicine

## 2018-02-16 ENCOUNTER — Other Ambulatory Visit (INDEPENDENT_AMBULATORY_CARE_PROVIDER_SITE_OTHER): Payer: MEDICARE

## 2018-02-16 ENCOUNTER — Encounter: Payer: Self-pay | Admitting: Family

## 2018-02-16 ENCOUNTER — Ambulatory Visit (INDEPENDENT_AMBULATORY_CARE_PROVIDER_SITE_OTHER): Payer: MEDICARE | Admitting: Family

## 2018-02-16 ENCOUNTER — Telehealth: Payer: Self-pay | Admitting: Family

## 2018-02-16 VITALS — BP 140/88 | HR 65 | Temp 98.0°F | Ht 70.0 in | Wt 220.0 lb

## 2018-02-16 DIAGNOSIS — H01024 Squamous blepharitis left upper eyelid: Secondary | ICD-10-CM | POA: Diagnosis not present

## 2018-02-16 DIAGNOSIS — H01021 Squamous blepharitis right upper eyelid: Secondary | ICD-10-CM | POA: Diagnosis not present

## 2018-02-16 DIAGNOSIS — R195 Other fecal abnormalities: Secondary | ICD-10-CM

## 2018-02-16 DIAGNOSIS — H01025 Squamous blepharitis left lower eyelid: Secondary | ICD-10-CM | POA: Diagnosis not present

## 2018-02-16 DIAGNOSIS — D509 Iron deficiency anemia, unspecified: Secondary | ICD-10-CM

## 2018-02-16 DIAGNOSIS — H11153 Pinguecula, bilateral: Secondary | ICD-10-CM | POA: Diagnosis not present

## 2018-02-16 DIAGNOSIS — H10413 Chronic giant papillary conjunctivitis, bilateral: Secondary | ICD-10-CM | POA: Diagnosis not present

## 2018-02-16 DIAGNOSIS — H01022 Squamous blepharitis right lower eyelid: Secondary | ICD-10-CM | POA: Diagnosis not present

## 2018-02-16 DIAGNOSIS — Z961 Presence of intraocular lens: Secondary | ICD-10-CM | POA: Diagnosis not present

## 2018-02-16 LAB — IRON,TIBC AND FERRITIN PANEL
%SAT: 28 % (calc) (ref 15–60)
Ferritin: 378 ng/mL (ref 20–380)
IRON: 105 ug/dL (ref 50–180)
TIBC: 374 mcg/dL (calc) (ref 250–425)

## 2018-02-16 LAB — CBC WITH DIFFERENTIAL/PLATELET
Basophils Absolute: 0.1 10*3/uL (ref 0.0–0.1)
Basophils Relative: 1.1 % (ref 0.0–3.0)
Eosinophils Absolute: 0.4 10*3/uL (ref 0.0–0.7)
Eosinophils Relative: 5 % (ref 0.0–5.0)
HEMATOCRIT: 40 % (ref 39.0–52.0)
Hemoglobin: 13.4 g/dL (ref 13.0–17.0)
LYMPHS PCT: 20.4 % (ref 12.0–46.0)
Lymphs Abs: 1.6 10*3/uL (ref 0.7–4.0)
MCHC: 33.4 g/dL (ref 30.0–36.0)
MCV: 89.9 fl (ref 78.0–100.0)
MONOS PCT: 8.9 % (ref 3.0–12.0)
Monocytes Absolute: 0.7 10*3/uL (ref 0.1–1.0)
Neutro Abs: 5.1 10*3/uL (ref 1.4–7.7)
Neutrophils Relative %: 64.6 % (ref 43.0–77.0)
PLATELETS: 290 10*3/uL (ref 150.0–400.0)
RBC: 4.45 Mil/uL (ref 4.22–5.81)
RDW: 15.3 % (ref 11.5–15.5)
WBC: 7.8 10*3/uL (ref 4.0–10.5)

## 2018-02-16 NOTE — Progress Notes (Signed)
Ronald Bond is a 72 y.o. male with the following history as recorded in [employer]:  Patient Active Problem List   Diagnosis Date Noted  . Thiamine deficiency 06/08/2017  . Obesity (BMI 30.0-34.9) 06/04/2017  . Prediabetes 06/01/2017  . Colon cancer screening 06/01/2017  . Hypertensive heart disease without CHF (congestive heart failure) 12/25/2016  . Frequent PVCs 09/27/2016  . Right cervical radiculopathy 06/16/2016  . Iron deficiency anemia 03/08/2016  . Spinal stenosis in cervical region 08/25/2015  . Allergic eczema 08/07/2014  . Obstructive sleep apnea 11/12/2013  . Mild intermittent asthma 06/03/2013  . Routine general medical examination at a health care facility 04/02/2013  . Herniated lumbar disc without myelopathy 10/18/2012  . Low back pain 10/18/2012  . Allergic rhinitis, cause unspecified 03/10/2011  . Cardiomegaly 10/18/2010  . ERECTILE DYSFUNCTION, NON-ORGANIC, MILD 06/29/2009  . Hypothyroidism 05/28/2009  . Hyperlipidemia with target LDL less than 100 05/28/2009  . Essential hypertension, benign 05/28/2009  . GERD 05/28/2009  . BPH associated with nocturia 05/28/2009  . Primary osteoarthritis of right knee 05/28/2009    Current Outpatient Medications  Medication Sig Dispense Refill  . amLODipine (NORVASC) 10 MG tablet Take 1 tablet (10 mg total) by mouth daily. 90 tablet 1  . aspirin 81 MG EC tablet Take 81 mg by mouth daily.      Marland Kitchen atorvastatin (LIPITOR) 40 MG tablet Take 1 tablet (40 mg total) by mouth daily. 90 tablet 1  . cromolyn (OPTICROM) 4 % ophthalmic solution     . ferrous sulfate 325 (65 FE) MG tablet Take 1 tablet (325 mg total) by mouth 2 (two) times daily with a meal. 180 tablet 1  . levothyroxine (SYNTHROID, LEVOTHROID) 125 MCG tablet Take 1 tablet (125 mcg total) by mouth daily. 90 tablet 1  . losartan (COZAAR) 100 MG tablet Take 1 tablet (100 mg total) by mouth daily. 90 tablet 1  . mometasone-formoterol (DULERA) 100-5 MCG/ACT AERO Inhale 2  puffs into the lungs 2 (two) times daily. 13 g 11  . Multiple Vitamin (MULTIVITAMIN) tablet Take 1 tablet by mouth daily.    Marland Kitchen omeprazole (PRILOSEC) 20 MG capsule TAKE 1 CAPSULE (20 MG TOTAL) BY MOUTH DAILY. 30 capsule 0  . oxybutynin (DITROPAN) 5 MG tablet     . tamsulosin (FLOMAX) 0.4 MG CAPS capsule TAKE 1 CAPSULE (0.4 MG TOTAL) BY MOUTH DAILY AFTER SUPPER. 90 capsule 1  . thiamine (VITAMIN B-1) 50 MG tablet Take 1 tablet (50 mg total) by mouth daily. 90 tablet 1   No current facility-administered medications for this visit.     Allergies: Lisinopril  Past Medical History:  Diagnosis Date  . GERD (gastroesophageal reflux disease)   . HTN (hypertension)   . Hyperlipidemia   . Hypothyroidism   . Osteoarthritis   . Wears glasses     Past Surgical History:  Procedure Laterality Date  . COLONOSCOPY    . EYE SURGERY     both cataracts  . MICROLARYNGOSCOPY Left 11/18/2013   Procedure: MICROLARYNGOSCOPY WITH REMOVAL OF GRANULOMA LEFT SIDE;  Surgeon: Izora Gala, MD;  Location: [facility];  Service: ENT;  Laterality: Left;  . THYROIDECTOMY  1974  . TONSILLECTOMY    . TOTAL KNEE ARTHROPLASTY  2009   left    Family History  Problem Relation Age of Onset  . Brain cancer Mother   . Heart disease Father   . Heart disease Brother     Social History   Tobacco Use  .  Smoking status: Never Smoker  . Smokeless tobacco: Never Used  Substance Use Topics  . Alcohol use: No    Subjective:  Patient presents with concerns for "dark stools" x 2-3 weeks; denies any abdominal pain; denies actually seeing blood in stool/ blood in toilet tissue; does have history of iron-deficiency anemia x 8 months- using OTC iron supplements within the past few weeks; no nausea, no vomiting; is scheduled for colonoscopy in early April with Dr. Collene Mares ( GI that did last colonoscopy approximately 7-8 years ago.) Has not spoken to his GI about concern for dark stools; denies any chest pain, shortness  of breath; weight is stable compared to exam in early January 2019.  Objective:  Vitals:   02/16/18 0822  BP: 140/88  Pulse: 65  Temp: 98 F (36.7 C)  TempSrc: Oral  SpO2: 99%  Weight: 220 lb 0.6 oz (99.8 kg)  Height: 5\' 10"  (1.778 m)    General: Well developed, well nourished, in no acute distress  Skin : Warm and dry.  Head: Normocephalic and atraumatic  Lungs: Respirations unlabored; clear to auscultation bilaterally without wheeze, rales, rhonchi  Abdomen: Soft; nontender; nondistended; normoactive bowel sounds; no masses or hepatosplenomegaly  Neurologic: Alert and oriented; speech intact; face symmetrical; moves all extremities well; CNII-XII intact without focal deficit   Assessment:  1. Iron deficiency anemia, unspecified iron deficiency anemia type   2. Dark stools     Plan:  Hemmocult is positive in office; STAT CBC is done and hemoglobin has actually improved/ stabilized compared to previous order; continue iron supplement; will contact his GI to let her know about rectal bleeding and get him seen sooner than planned colonoscopy in early April; he agrees and expresses understanding. Strict ER precautions for upcoming weekend.  No follow-ups on file.  Orders Placed This Encounter  Procedures  . Iron, TIBC and Ferritin Panel    Standing Status:   Future    Number of Occurrences:   1    Standing Expiration Date:   02/17/2019  . CBC w/Diff    Standing Status:   Future    Number of Occurrences:   1    Standing Expiration Date:   02/16/2019    Requested Prescriptions    No prescriptions requested or ordered in this encounter

## 2018-02-16 NOTE — Telephone Encounter (Signed)
Please call his GI, Dr. Collene Mares at [phone] and let her know that patient has been having dark stools x 2-3 weeks; + hemocult today but normal CBC; know he is scheduled for colonosocpy in April but thought she would want to see him sooner. Please also send over CBC and iron studies from today.

## 2018-02-16 NOTE — Patient Instructions (Signed)
Please come back to the office after your eye appointment;

## 2018-02-19 NOTE — Telephone Encounter (Signed)
Called Dr. Lorie Apley office today. Spoke with nurse. She made apt for patient to come in tomorrow at 3:00 pm. Called patient and informed him of appointment and to call Dr. Lorie Apley office back if he was not able to keep appointment. Labs faxed over as well to [phone] per nurse request and Laura's.

## 2018-02-20 DIAGNOSIS — R194 Change in bowel habit: Secondary | ICD-10-CM | POA: Diagnosis not present

## 2018-02-20 DIAGNOSIS — K219 Gastro-esophageal reflux disease without esophagitis: Secondary | ICD-10-CM | POA: Diagnosis not present

## 2018-02-20 DIAGNOSIS — Z1211 Encounter for screening for malignant neoplasm of colon: Secondary | ICD-10-CM | POA: Diagnosis not present

## 2018-02-20 DIAGNOSIS — K5900 Constipation, unspecified: Secondary | ICD-10-CM | POA: Diagnosis not present

## 2018-02-22 ENCOUNTER — Other Ambulatory Visit: Payer: Self-pay | Admitting: Internal Medicine

## 2018-02-22 DIAGNOSIS — N410 Acute prostatitis: Secondary | ICD-10-CM

## 2018-02-22 DIAGNOSIS — R351 Nocturia: Principal | ICD-10-CM

## 2018-02-22 DIAGNOSIS — N401 Enlarged prostate with lower urinary tract symptoms: Secondary | ICD-10-CM

## 2018-02-22 MED FILL — ATORVASTATIN 40 MG TABLET: 40 | 30 days supply | Qty: 30 | Fill #2 | Status: TO

## 2018-03-02 DIAGNOSIS — K5190 Ulcerative colitis, unspecified, without complications: Secondary | ICD-10-CM | POA: Diagnosis not present

## 2018-03-02 DIAGNOSIS — K219 Gastro-esophageal reflux disease without esophagitis: Secondary | ICD-10-CM | POA: Diagnosis not present

## 2018-03-02 DIAGNOSIS — K635 Polyp of colon: Secondary | ICD-10-CM | POA: Diagnosis not present

## 2018-03-02 DIAGNOSIS — D122 Benign neoplasm of ascending colon: Secondary | ICD-10-CM | POA: Diagnosis not present

## 2018-03-02 DIAGNOSIS — K2971 Gastritis, unspecified, with bleeding: Secondary | ICD-10-CM | POA: Diagnosis not present

## 2018-03-02 DIAGNOSIS — K319 Disease of stomach and duodenum, unspecified: Secondary | ICD-10-CM | POA: Diagnosis not present

## 2018-03-02 DIAGNOSIS — Z1211 Encounter for screening for malignant neoplasm of colon: Secondary | ICD-10-CM | POA: Diagnosis not present

## 2018-03-02 DIAGNOSIS — Z86010 Personal history of colonic polyps: Secondary | ICD-10-CM | POA: Diagnosis not present

## 2018-03-02 LAB — HM COLONOSCOPY

## 2018-03-02 MED FILL — TAMSULOSIN HCL 0.4 MG CAP: 0.4 | 90 days supply | Qty: 90 | Fill #0 | Status: TO

## 2018-03-05 ENCOUNTER — Other Ambulatory Visit: Payer: Self-pay | Admitting: Family Medicine

## 2018-03-09 ENCOUNTER — Encounter: Payer: Self-pay | Admitting: Internal Medicine

## 2018-03-13 ENCOUNTER — Encounter: Payer: Self-pay | Admitting: Internal Medicine

## 2018-03-13 MED FILL — LEVOTHYROXINE 125 MCG TAB: 125 | 90 days supply | Qty: 90 | Fill #1

## 2018-03-13 MED FILL — AMLODIPINE BESYLATE 10 MG T: 10 | 30 days supply | Qty: 30 | Fill #2 | Status: TO

## 2018-03-19 ENCOUNTER — Other Ambulatory Visit: Payer: Self-pay | Admitting: Family Medicine

## 2018-03-19 DIAGNOSIS — Z1211 Encounter for screening for malignant neoplasm of colon: Secondary | ICD-10-CM | POA: Diagnosis not present

## 2018-03-21 ENCOUNTER — Other Ambulatory Visit: Payer: Self-pay | Admitting: Family Medicine

## 2018-03-21 LAB — COLOGUARD: Cologuard: NEGATIVE

## 2018-03-23 ENCOUNTER — Other Ambulatory Visit: Payer: Self-pay | Admitting: Family Medicine

## 2018-03-27 ENCOUNTER — Other Ambulatory Visit: Payer: Self-pay | Admitting: Family Medicine

## 2018-03-30 ENCOUNTER — Encounter: Payer: Self-pay | Admitting: Internal Medicine

## 2018-04-09 MED FILL — CROMOLYN 4% EYE DROPS: 4 | 25 days supply | Qty: 10 | Fill #0

## 2018-04-09 MED FILL — AMLODIPINE BESYLATE 10 MG T: 10 | 30 days supply | Qty: 30 | Fill #0 | Status: TO

## 2018-04-09 MED FILL — LOSARTAN POTASSIUM 100 MG T: 100 | 30 days supply | Qty: 30 | Fill #0 | Status: TO

## 2018-04-10 MED FILL — ATORVASTATIN 40 MG TABLET: 40 | 30 days supply | Qty: 30 | Fill #0 | Status: TO

## 2018-04-30 ENCOUNTER — Ambulatory Visit (INDEPENDENT_AMBULATORY_CARE_PROVIDER_SITE_OTHER)
Admission: RE | Admit: 2018-04-30 | Discharge: 2018-04-30 | Disposition: A | Discharge status: Home / Self Care | Payer: MEDICARE | Source: Ambulatory Visit | Attending: Family Medicine | Admitting: Family Medicine

## 2018-04-30 ENCOUNTER — Other Ambulatory Visit (INDEPENDENT_AMBULATORY_CARE_PROVIDER_SITE_OTHER): Payer: MEDICARE

## 2018-04-30 ENCOUNTER — Encounter: Payer: Self-pay | Admitting: Family Medicine

## 2018-04-30 ENCOUNTER — Ambulatory Visit: Payer: Self-pay

## 2018-04-30 ENCOUNTER — Ambulatory Visit (INDEPENDENT_AMBULATORY_CARE_PROVIDER_SITE_OTHER): Payer: MEDICARE | Admitting: Internal Medicine

## 2018-04-30 ENCOUNTER — Encounter: Payer: Self-pay | Admitting: Internal Medicine

## 2018-04-30 ENCOUNTER — Ambulatory Visit (INDEPENDENT_AMBULATORY_CARE_PROVIDER_SITE_OTHER): Payer: MEDICARE | Admitting: Family Medicine

## 2018-04-30 VITALS — BP 128/86 | HR 54 | Temp 98.1°F | Ht 70.0 in | Wt 218.0 lb

## 2018-04-30 VITALS — BP 128/86 | HR 54 | Ht 70.0 in | Wt 218.0 lb

## 2018-04-30 DIAGNOSIS — G8929 Other chronic pain: Secondary | ICD-10-CM

## 2018-04-30 DIAGNOSIS — E039 Hypothyroidism, unspecified: Secondary | ICD-10-CM

## 2018-04-30 DIAGNOSIS — M25511 Pain in right shoulder: Secondary | ICD-10-CM

## 2018-04-30 DIAGNOSIS — M542 Cervicalgia: Secondary | ICD-10-CM | POA: Diagnosis not present

## 2018-04-30 DIAGNOSIS — R7303 Prediabetes: Secondary | ICD-10-CM

## 2018-04-30 DIAGNOSIS — S199XXA Unspecified injury of neck, initial encounter: Secondary | ICD-10-CM | POA: Diagnosis not present

## 2018-04-30 DIAGNOSIS — M75120 Complete rotator cuff tear or rupture of unspecified shoulder, not specified as traumatic: Secondary | ICD-10-CM | POA: Insufficient documentation

## 2018-04-30 DIAGNOSIS — S46011A Strain of muscle(s) and tendon(s) of the rotator cuff of right shoulder, initial encounter: Secondary | ICD-10-CM | POA: Diagnosis not present

## 2018-04-30 LAB — HEMOGLOBIN A1C: Hgb A1c MFr Bld: 6.2 % (ref 4.6–6.5)

## 2018-04-30 LAB — TSH: TSH: 3.15 u[IU]/mL (ref 0.35–4.50)

## 2018-04-30 MED ORDER — TRAMADOL HCL 50 MG PO TABS: 50.0000 mg | ORAL_TABLET | Freq: Three times a day (TID) | ORAL | 0 refills | Status: DC | PRN

## 2018-04-30 MED ORDER — VITAMIN D (ERGOCALCIFEROL) 1.25 MG (50000 UNIT) PO CAPS: 50000.0000 [IU] | ORAL_CAPSULE | ORAL | 0 refills | Status: DC

## 2018-04-30 MED FILL — VIT D2 1.25 MG (50,000 UNIT: 1.25 MG | 84 days supply | Qty: 12 | Fill #0

## 2018-04-30 MED FILL — traMADol HCL 50 MG TABS: 50 | 7 days supply | Qty: 21 | Fill #0
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
## 2018-04-30 NOTE — Patient Instructions (Addendum)
Good to see you  You do have a rotator cuff tear  We attempted an injection today  Ice 20 minutes 2 times daily. Usually after activity and before bed. pennsaid pinkie amount topically 2 times daily as needed.  Once weekly vitamin D for 12 weeks Keep hands within peripheral vision  Xrays downstairs See me again in 3-4 weeks

## 2018-04-30 NOTE — Patient Instructions (Signed)
Please take all new medication as prescribed- the tramadol for pain  Please continue all other medications as before  Please have the pharmacy call with any other refills you may need.  Please continue your efforts at being more active, low cholesterol diet, and weight control.  Please keep your appointments with your specialists as you may have planned  You will be seen per Dr Tamala Julian this morning as well  Please go to the LAB in the Basement (turn left off the elevator) for the tests to be done today  You will be contacted by phone if any changes need to be made immediately.  Otherwise, you will receive a letter about your results with an explanation, but please check with MyChart first.

## 2018-04-30 NOTE — Assessment & Plan Note (Signed)
Asympt, last tsh low with med adjustement, pt reqeusts f/u tsh

## 2018-04-30 NOTE — Assessment & Plan Note (Signed)
Complete tear noted.  Discussed icing regimen and home exercises.  Injection given today.  X-rays ordered.  May need MRI and surgical intervention with patient being very active.  Following up again in 3 weeks

## 2018-04-30 NOTE — Assessment & Plan Note (Signed)
C/w possible bursitis, for tramadol prn, and refer sports medicine

## 2018-04-30 NOTE — Assessment & Plan Note (Signed)
stable overall by history and exam, recent data reviewed with pt, and pt to continue medical treatment as before,  to f/u any worsening symptoms or concerns  

## 2018-05-14 ENCOUNTER — Other Ambulatory Visit: Payer: Self-pay | Admitting: Internal Medicine

## 2018-05-14 ENCOUNTER — Encounter: Payer: Self-pay | Admitting: Internal Medicine

## 2018-05-14 DIAGNOSIS — M4802 Spinal stenosis, cervical region: Secondary | ICD-10-CM

## 2018-05-14 DIAGNOSIS — M5126 Other intervertebral disc displacement, lumbar region: Secondary | ICD-10-CM

## 2018-05-14 DIAGNOSIS — Z79891 Long term (current) use of opiate analgesic: Secondary | ICD-10-CM

## 2018-05-14 DIAGNOSIS — M75120 Complete rotator cuff tear or rupture of unspecified shoulder, not specified as traumatic: Secondary | ICD-10-CM

## 2018-05-14 DIAGNOSIS — M5412 Radiculopathy, cervical region: Secondary | ICD-10-CM

## 2018-05-14 DIAGNOSIS — M1711 Unilateral primary osteoarthritis, right knee: Secondary | ICD-10-CM

## 2018-05-14 MED ORDER — HYDROCODONE-ACETAMINOPHEN 10-325 MG PO TABS: 1.0000 | ORAL_TABLET | Freq: Three times a day (TID) | ORAL | 0 refills | Status: DC | PRN

## 2018-05-14 MED ORDER — NALOXONE HCL 4 MG/0.1ML NA LIQD: 1.0000 | Freq: Once | NASAL | 2 refills | Status: AC

## 2018-05-14 MED FILL — AMLODIPINE BESYLATE 10 MG T: 10 | 30 days supply | Qty: 30 | Fill #0

## 2018-05-14 MED FILL — ATORVASTATIN 40 MG TABLET: 40 | 30 days supply | Qty: 30 | Fill #0

## 2018-05-14 MED FILL — LOSARTAN POTASSIUM 100 MG T: 100 | 30 days supply | Qty: 30 | Fill #0

## 2018-05-14 MED FILL — HYDROCODON-APAP 10-325: 10-325 | 30 days supply | Qty: 90 | Fill #0

## 2018-06-06 NOTE — Progress Notes (Signed)
Ronald Ronald Bond [name], [place] [address] Phone: [phone] Subjective:    I'm seeing this patient by the request  of:    CC: Right shoulder pain  [id number]  Ronald Bond is a 72 y.o. male coming in with complaint of shoulder pain. He continues to have pain in the right trap. He has some good days. Still has to use AAROM to perform shoulder flexion. Injection given at last visit which relieved pain for 2 days.  Patient was found to have a rotator cuff tear.  He did have some mild retraction.  States someone unfortunately continues to have discomfort and pain.    Past Medical History:  Diagnosis Date  . GERD (gastroesophageal reflux disease)   . HTN (hypertension)   . Hyperlipidemia   . Hypothyroidism   . Osteoarthritis   . Wears glasses    Past Surgical History:  Procedure Laterality Date  . COLONOSCOPY    . EYE SURGERY     both cataracts  . MICROLARYNGOSCOPY Left 11/18/2013   Procedure: MICROLARYNGOSCOPY WITH REMOVAL OF GRANULOMA LEFT SIDE;  Surgeon: Izora Gala, MD;  Location: [facility];  Service: ENT;  Laterality: Left;  . THYROIDECTOMY  1974  . TONSILLECTOMY    . TOTAL KNEE ARTHROPLASTY  2009   left   Social History   Socioeconomic History  . Marital status: Married    Spouse name: Not on file  . Number of children: Not on file  . Years of education: Not on file  . Highest education level: Not on file  Occupational History  . Occupation: Retired    Fish farm manager: DISABLED  Social Needs  . Financial resource strain: Not hard at all  . Food insecurity:    Worry: Never true    Inability: Never true  . Transportation needs:    Medical: No    Non-medical: No  Tobacco Use  . Smoking status: Never Smoker  . Smokeless tobacco: Never Used  Substance and Sexual Activity  . Alcohol use: No  . Drug use: No  . Sexual activity: Yes  Lifestyle  . Physical activity:    Days per week: 3 days    Minutes per  session: 60 min  . Stress: Not at all  Relationships  . Social connections:    Talks on phone: More than three times a week    Gets together: More than three times a week    Attends religious service: More than 4 times per year    Active member of club or organization: Not on file    Attends meetings of clubs or organizations: More than 4 times per year    Relationship status: Married  Other Topics Concern  . Not on file  Social History Narrative   Regular Exercise -  YES         Allergies  Allergen Reactions  . Lisinopril Other (See Comments)    edema   Family History  Problem Relation Age of Onset  . Brain cancer Mother   . Heart disease Father   . Heart disease Brother      Past medical history, social, surgical and family history all reviewed in electronic medical record.  No pertanent information unless stated regarding to the chief complaint.   Review of Systems:Review of systems updated and as accurate as of 06/07/18  No headache, visual changes, nausea, vomiting, diarrhea, constipation, dizziness, abdominal pain, skin rash, fevers, chills, night sweats, weight loss, swollen  lymph nodes, body aches, joint swelling, muscle aches, chest pain, shortness of breath, mood changes.  Positive muscle aches  Objective  Blood pressure (!) 142/80, pulse 60, height 5\' 10"  (1.778 m), weight 210 lb (95.3 kg), SpO2 96 %. Systems examined below as of 06/07/18   General: No apparent distress alert and oriented x3 mood and affect normal, dressed appropriately.  HEENT: Pupils equal, extraocular movements intact  Respiratory: Patient's speak in full sentences and does not appear short of breath  Cardiovascular: No lower extremity edema, non tender, no erythema  Skin: Warm dry intact with no signs of infection or rash on extremities or on axial skeleton.  Abdomen: Soft nontender  Neuro: Cranial nerves II through XII are intact, neurovascularly intact in all extremities with 2+ DTRs and  2+ pulses.  Lymph: No lymphadenopathy of posterior or anterior cervical chain or axillae bilaterally.  Gait normal with good balance and coordination.  MSK:  Non tender with full range of motion and good stability and symmetric strength and tone of  elbows, wrist, hip, knee and ankles bilaterally. Shoulder: Right Inspection reveals no abnormalities, atrophy or asymmetry. Palpation is normal with no tenderness over AC joint or bicipital groove. ROM is lacking the last 5 degrees of external rotation Rotator cuff strength 4 out of 5 compared to the contralateral side Positive impingement Speeds and Yergason's tests normal. Positive O'Brien Normal scapular function observed. No painful arc and no drop arm sign. No apprehension sign Contralateral shoulder unremarkable  MSK US performed of: Right shoulder This study was ordered, performed, and interpreted by Charlann Boxer D.O.  Shoulder:   Supraspinatus: Patient does have a tear noted.  Seems to be high-grade, 1 cm retraction noted less swelling overall though than previous exam AC joint: Mild arthritic changes Glenohumeral Joint:  Appears normal without effusion. Glenoid Labrum:  Intact without visualized tears. Biceps Tendon: Mild hypoechoic changes Impression: Rotator cuff tear with still retraction      Impression and Recommendations:     This case required medical decision making of moderate complexity.      Note: This dictation was prepared with Dragon dictation along with smaller phrase technology. Any transcriptional errors that result from this process are unintentional.

## 2018-06-07 ENCOUNTER — Encounter: Payer: Self-pay | Admitting: Family Medicine

## 2018-06-07 ENCOUNTER — Ambulatory Visit (INDEPENDENT_AMBULATORY_CARE_PROVIDER_SITE_OTHER): Payer: MEDICARE | Admitting: Family Medicine

## 2018-06-07 ENCOUNTER — Ambulatory Visit: Payer: Self-pay

## 2018-06-07 VITALS — BP 142/80 | HR 60 | Ht 70.0 in | Wt 210.0 lb

## 2018-06-07 DIAGNOSIS — G8929 Other chronic pain: Secondary | ICD-10-CM | POA: Diagnosis not present

## 2018-06-07 DIAGNOSIS — M75120 Complete rotator cuff tear or rupture of unspecified shoulder, not specified as traumatic: Secondary | ICD-10-CM | POA: Diagnosis not present

## 2018-06-07 DIAGNOSIS — M75101 Unspecified rotator cuff tear or rupture of right shoulder, not specified as traumatic: Secondary | ICD-10-CM

## 2018-06-07 DIAGNOSIS — M25511 Pain in right shoulder: Secondary | ICD-10-CM

## 2018-06-07 NOTE — Patient Instructions (Signed)
Good to see you  We will get an epidural in your neck and see how much that is contributing. Call them at [phone] and you can schedule. Also will get you in with PT in [place] and they will call you  Continue our exercises 2-3 times a week  Ice 20 minutes 2 times daily. Usually after activity and before bed. pennsaid pinkie amount topically 2 times daily as needed.   See me again in 3 weeks

## 2018-06-07 NOTE — Assessment & Plan Note (Signed)
Rotator cuff tear that responded only short-term to the injection.  Still has some mild weakness.  Patient wants to trial conservative therapy and will be sent to formal physical therapy at this point.  We discussed icing regimen, home exercise, which activities to do which wants to avoid.  Follow-up again in 4 to 8 weeks

## 2018-06-18 ENCOUNTER — Other Ambulatory Visit: Payer: Self-pay | Admitting: Internal Medicine

## 2018-06-18 DIAGNOSIS — E032 Hypothyroidism due to medicaments and other exogenous substances: Secondary | ICD-10-CM

## 2018-06-18 MED FILL — AMLODIPINE BESYLATE 10 MG T: 10 | 30 days supply | Qty: 30 | Fill #1

## 2018-06-18 MED FILL — ATORVASTATIN 40 MG TABLET: 40 | 30 days supply | Qty: 30 | Fill #1

## 2018-06-18 MED FILL — LOSARTAN POTASSIUM 100 MG T: 100 | 30 days supply | Qty: 30 | Fill #1

## 2018-06-19 ENCOUNTER — Ambulatory Visit: Payer: MEDICARE | Admitting: Physical Therapy

## 2018-06-19 MED FILL — LEVOTHYROXINE 125 MCG TAB: 125 | 30 days supply | Qty: 30 | Fill #0

## 2018-06-21 ENCOUNTER — Other Ambulatory Visit: Payer: MEDICARE

## 2018-07-06 ENCOUNTER — Other Ambulatory Visit: Payer: MEDICARE

## 2018-07-16 ENCOUNTER — Inpatient Hospital Stay
Admission: RE | Admit: 2018-07-16 | Discharge: 2018-07-16 | Disposition: A | Discharge status: Home / Self Care | Payer: MEDICARE | Source: Ambulatory Visit | Attending: Family Medicine | Admitting: Family Medicine

## 2018-07-20 MED FILL — LEVOTHYROXINE 125 MCG TAB: 125 | 30 days supply | Qty: 30 | Fill #1

## 2018-08-15 ENCOUNTER — Other Ambulatory Visit: Payer: Self-pay | Admitting: Internal Medicine

## 2018-08-15 DIAGNOSIS — I119 Hypertensive heart disease without heart failure: Secondary | ICD-10-CM

## 2018-08-17 ENCOUNTER — Ambulatory Visit
Admission: RE | Admit: 2018-08-17 | Discharge: 2018-08-17 | Disposition: A | Discharge status: Home / Self Care | Payer: MEDICARE | Source: Ambulatory Visit | Attending: Family Medicine | Admitting: Family Medicine

## 2018-08-17 DIAGNOSIS — M47812 Spondylosis without myelopathy or radiculopathy, cervical region: Secondary | ICD-10-CM | POA: Diagnosis not present

## 2018-08-17 DIAGNOSIS — M75101 Unspecified rotator cuff tear or rupture of right shoulder, not specified as traumatic: Secondary | ICD-10-CM

## 2018-08-17 MED ORDER — IOPAMIDOL (ISOVUE-M 300) INJECTION 61%
1.0000 mL | Freq: Once | INTRAMUSCULAR | Status: AC | PRN
  Administered 2018-08-17: 1 mL via EPIDURAL

## 2018-08-17 MED ORDER — TRIAMCINOLONE ACETONIDE 40 MG/ML IJ SUSP (RADIOLOGY)
60.0000 mg | Freq: Once | INTRAMUSCULAR | Status: AC
  Administered 2018-08-17: 60 mg via EPIDURAL

## 2018-08-17 NOTE — Discharge Instructions (Signed)

## 2018-08-22 ENCOUNTER — Other Ambulatory Visit: Payer: Self-pay | Admitting: Internal Medicine

## 2018-08-22 DIAGNOSIS — E785 Hyperlipidemia, unspecified: Secondary | ICD-10-CM

## 2018-09-12 NOTE — Progress Notes (Signed)
[facility], [place] [address] Phone: [phone] Subjective:     CC: Right shoulder pain follow-up  [id number]  Ronald Bond is a 72 y.o. male coming in with complaint of right shoulder pain.  Found to have a large rotator cuff tear with retraction.  Was given injection in June.  Has been doing the exercises very well.  Continue though to have weakness.    Past Medical History:  Diagnosis Date  . GERD (gastroesophageal reflux disease)   . HTN (hypertension)   . Hyperlipidemia   . Hypothyroidism   . Osteoarthritis   . Wears glasses    Past Surgical History:  Procedure Laterality Date  . COLONOSCOPY    . EYE SURGERY     both cataracts  . MICROLARYNGOSCOPY Left 11/18/2013   Procedure: MICROLARYNGOSCOPY WITH REMOVAL OF GRANULOMA LEFT SIDE;  Surgeon: Izora Gala, MD;  Location: [facility];  Service: ENT;  Laterality: Left;  . THYROIDECTOMY  1974  . TONSILLECTOMY    . TOTAL KNEE ARTHROPLASTY  2009   left   Social History   Socioeconomic History  . Marital status: Married    Spouse name: Not on file  . Number of children: Not on file  . Years of education: Not on file  . Highest education level: Not on file  Occupational History  . Occupation: Retired    Fish farm manager: DISABLED  Social Needs  . Financial resource strain: Not hard at all  . Food insecurity:    Worry: Never true    Inability: Never true  . Transportation needs:    Medical: No    Non-medical: No  Tobacco Use  . Smoking status: Never Smoker  . Smokeless tobacco: Never Used  Substance and Sexual Activity  . Alcohol use: No  . Drug use: No  . Sexual activity: Yes  Lifestyle  . Physical activity:    Days per week: 3 days    Minutes per session: 60 min  . Stress: Not at all  Relationships  . Social connections:    Talks on phone: More than three times a week    Gets together: More than three times a week    Attends religious  service: More than 4 times per year    Active member of club or organization: Not on file    Attends meetings of clubs or organizations: More than 4 times per year    Relationship status: Married  Other Topics Concern  . Not on file  Social History Narrative   Regular Exercise -  YES         Allergies  Allergen Reactions  . Lisinopril Other (See Comments)    Edema in face   Family History  Problem Relation Age of Onset  . Brain cancer Mother   . Heart disease Father   . Heart disease Brother     Current Outpatient Medications (Endocrine & Metabolic):  .  levothyroxine (SYNTHROID, LEVOTHROID) 125 MCG tablet, TAKE 1 TABLET (125 MCG TOTAL) BY MOUTH DAILY.  Current Outpatient Medications (Cardiovascular):  .  amLODipine (NORVASC) 10 MG tablet, TAKE 1 TABLET (10 MG TOTAL) BY MOUTH DAILY. Marland Kitchen  atorvastatin (LIPITOR) 40 MG tablet, TAKE 1 TABLET (40 MG TOTAL) BY MOUTH DAILY. Marland Kitchen  losartan (COZAAR) 100 MG tablet, TAKE 1 TABLET BY MOUTH DAILY  Current Outpatient Medications (Respiratory):  .  mometasone-formoterol (DULERA) 100-5 MCG/ACT AERO, Inhale 2 puffs into the lungs 2 (two) times daily.  Current Outpatient Medications (Analgesics):  .  aspirin 81 MG EC tablet, Take 81 mg by mouth daily.   Marland Kitchen  HYDROcodone-acetaminophen (NORCO) 10-325 MG tablet, Take 1 tablet by mouth every 8 (eight) hours as needed.  Current Outpatient Medications (Hematological):  .  ferrous sulfate 325 (65 FE) MG tablet, Take 1 tablet (325 mg total) by mouth 2 (two) times daily with a meal.  Current Outpatient Medications (Other):  .  cromolyn (OPTICROM) 4 % ophthalmic solution,  .  Multiple Vitamin (MULTIVITAMIN) tablet, Take 1 tablet by mouth daily. Marland Kitchen  omeprazole (PRILOSEC) 20 MG capsule, TAKE 1 CAPSULE (20 MG TOTAL) BY MOUTH DAILY. Marland Kitchen  oxybutynin (DITROPAN) 5 MG tablet,  .  tamsulosin (FLOMAX) 0.4 MG CAPS capsule, TAKE ONE CAPSULE BY MOUTH DAILY AFTER SUPPER .  thiamine (VITAMIN B-1) 50 MG tablet, Take 1  tablet (50 mg total) by mouth daily. .  Vitamin D, Ergocalciferol, (DRISDOL) 50000 units CAPS capsule, Take 1 capsule (50,000 Units total) by mouth every 7 (seven) days.    Past medical history, social, surgical and family history all reviewed in electronic medical record.  No pertanent information unless stated regarding to the chief complaint.   Review of Systems:  No headache, visual changes, nausea, vomiting, diarrhea, constipation, dizziness, abdominal pain, skin rash, fevers, chills, night sweats, weight loss, swollen lymph nodes, body aches, joint swelling,  chest pain, shortness of breath, mood changes.  Mild positive muscle aches  Objective  Blood pressure (!) 144/86, pulse (!) 58, height 5\' 10"  (1.778 m), weight 219 lb (99.3 kg), SpO2 95 %.    General: No apparent distress alert and oriented x3 mood and affect normal, dressed appropriately.  HEENT: Pupils equal, extraocular movements intact  Respiratory: Patient's speak in full sentences and does not appear short of breath  Cardiovascular: No lower extremity edema, non tender, no erythema  Skin: Warm dry intact with no signs of infection or rash on extremities or on axial skeleton.  Abdomen: Soft nontender  Neuro: Cranial nerves II through XII are intact, neurovascularly intact in all extremities with 2+ DTRs and 2+ pulses.  Lymph: No lymphadenopathy of posterior or anterior cervical chain or axillae bilaterally.  Gait antalgic MSK:  tender with full range of motion and good stability and symmetric strength and tone of  elbows, wrist, hip, knee and ankles bilaterally.  Right shoulder exam shows some atrophy of the musculature noted.  Patient does have weakness with 3 out of 5 strength.  Severe tenderness in impingement.  Neurovascularly intact distally.  Limited musculoskeletal ultrasound was performed and interpreted by Lyndal Pulley  Limited ultrasound shows that patient does have a continued rotator cuff tear of the  supraspinatus with 1 cm of retraction.  Does not appear to have any worsening retraction but patient does have some increased atrophy and lipid replacement of the tendon. Impression: Rotator cuff tear with no interval healing   Impression and Recommendations:     This case required medical decision making of moderate complexity. The above documentation has been reviewed and is accurate and complete Lyndal Pulley, DO       Note: This dictation was prepared with Dragon dictation along with smaller phrase technology. Any transcriptional errors that result from this process are unintentional.

## 2018-09-14 ENCOUNTER — Encounter: Payer: Self-pay | Admitting: Family Medicine

## 2018-09-14 ENCOUNTER — Ambulatory Visit (INDEPENDENT_AMBULATORY_CARE_PROVIDER_SITE_OTHER): Payer: MEDICARE | Admitting: Family Medicine

## 2018-09-14 ENCOUNTER — Ambulatory Visit: Payer: Self-pay

## 2018-09-14 VITALS — BP 144/86 | HR 58 | Ht 70.0 in | Wt 219.0 lb

## 2018-09-14 DIAGNOSIS — Z23 Encounter for immunization: Secondary | ICD-10-CM

## 2018-09-14 DIAGNOSIS — M25511 Pain in right shoulder: Secondary | ICD-10-CM | POA: Diagnosis not present

## 2018-09-14 DIAGNOSIS — G8929 Other chronic pain: Secondary | ICD-10-CM | POA: Diagnosis not present

## 2018-09-14 DIAGNOSIS — M75120 Complete rotator cuff tear or rupture of unspecified shoulder, not specified as traumatic: Secondary | ICD-10-CM

## 2018-09-14 NOTE — Patient Instructions (Signed)
Good to see you  Ice is your friend pennsaid pinkie amount topically 2 times daily as needed.  We will get MRI of the shoulder Please call [phone] to schedule  I will write you and discuss options when I get the results

## 2018-09-14 NOTE — Assessment & Plan Note (Signed)
Patient continues to have significant weakness of the right arm.  On ultrasound still sees a significant amount of retraction noted.  We discussed with patient at great length about icing regimen, home exercise, we discussed different options which included a repeat injection.  We also discussed the possibility of PRP that patient is significantly interested in.  I do believe though at this point I would like an MRI to further evaluate to see if this is a possibility versus the possible need for surgical intervention.  Patient is in agreement with plan and will follow-up after the MRI.

## 2018-09-15 ENCOUNTER — Other Ambulatory Visit: Payer: Self-pay | Admitting: Internal Medicine

## 2018-09-15 DIAGNOSIS — N401 Enlarged prostate with lower urinary tract symptoms: Secondary | ICD-10-CM

## 2018-09-15 DIAGNOSIS — R351 Nocturia: Secondary | ICD-10-CM

## 2018-09-15 DIAGNOSIS — E032 Hypothyroidism due to medicaments and other exogenous substances: Secondary | ICD-10-CM

## 2018-09-15 DIAGNOSIS — N410 Acute prostatitis: Secondary | ICD-10-CM

## 2018-09-15 DIAGNOSIS — I119 Hypertensive heart disease without heart failure: Secondary | ICD-10-CM

## 2018-09-15 MED ORDER — TAMSULOSIN HCL 0.4 MG PO CAPS: ORAL_CAPSULE | ORAL | 1 refills | Status: DC

## 2018-09-15 MED ORDER — LEVOTHYROXINE SODIUM 125 MCG PO TABS: 125.0000 ug | ORAL_TABLET | Freq: Every day | ORAL | 0 refills | Status: DC

## 2018-09-15 MED ORDER — AMLODIPINE BESYLATE 10 MG PO TABS: 10.0000 mg | ORAL_TABLET | Freq: Every day | ORAL | 1 refills | Status: DC

## 2018-09-20 ENCOUNTER — Other Ambulatory Visit: Payer: Self-pay | Admitting: Internal Medicine

## 2018-09-20 DIAGNOSIS — E032 Hypothyroidism due to medicaments and other exogenous substances: Secondary | ICD-10-CM

## 2018-09-21 MED FILL — LEVOTHYROXINE 125 MCG TAB: 125 | 30 days supply | Qty: 30 | Fill #0

## 2018-09-25 ENCOUNTER — Other Ambulatory Visit: Payer: Self-pay | Admitting: Internal Medicine

## 2018-09-25 DIAGNOSIS — N401 Enlarged prostate with lower urinary tract symptoms: Secondary | ICD-10-CM

## 2018-09-25 DIAGNOSIS — N410 Acute prostatitis: Secondary | ICD-10-CM

## 2018-09-25 DIAGNOSIS — R351 Nocturia: Principal | ICD-10-CM

## 2018-10-08 ENCOUNTER — Other Ambulatory Visit: Payer: Self-pay | Admitting: Family Medicine

## 2018-10-12 ENCOUNTER — Inpatient Hospital Stay
Admission: RE | Admit: 2018-10-12 | Discharge: 2018-10-12 | Disposition: A | Discharge status: Home / Self Care | Payer: MEDICARE | Source: Ambulatory Visit | Attending: Family Medicine | Admitting: Family Medicine

## 2018-10-12 ENCOUNTER — Other Ambulatory Visit: Payer: MEDICARE

## 2018-10-15 ENCOUNTER — Ambulatory Visit (INDEPENDENT_AMBULATORY_CARE_PROVIDER_SITE_OTHER): Payer: MEDICARE | Admitting: Internal Medicine

## 2018-10-15 ENCOUNTER — Encounter: Payer: Self-pay | Admitting: Internal Medicine

## 2018-10-15 ENCOUNTER — Other Ambulatory Visit (INDEPENDENT_AMBULATORY_CARE_PROVIDER_SITE_OTHER): Payer: MEDICARE

## 2018-10-15 VITALS — BP 170/70 | HR 53 | Temp 98.1°F | Resp 16 | Ht 70.0 in | Wt 224.0 lb

## 2018-10-15 DIAGNOSIS — E039 Hypothyroidism, unspecified: Secondary | ICD-10-CM

## 2018-10-15 DIAGNOSIS — D508 Other iron deficiency anemias: Secondary | ICD-10-CM

## 2018-10-15 DIAGNOSIS — R7303 Prediabetes: Secondary | ICD-10-CM | POA: Diagnosis not present

## 2018-10-15 DIAGNOSIS — E785 Hyperlipidemia, unspecified: Secondary | ICD-10-CM

## 2018-10-15 DIAGNOSIS — I10 Essential (primary) hypertension: Secondary | ICD-10-CM

## 2018-10-15 DIAGNOSIS — J301 Allergic rhinitis due to pollen: Secondary | ICD-10-CM | POA: Diagnosis not present

## 2018-10-15 DIAGNOSIS — I119 Hypertensive heart disease without heart failure: Secondary | ICD-10-CM

## 2018-10-15 DIAGNOSIS — N401 Enlarged prostate with lower urinary tract symptoms: Secondary | ICD-10-CM

## 2018-10-15 DIAGNOSIS — N3281 Overactive bladder: Secondary | ICD-10-CM | POA: Diagnosis not present

## 2018-10-15 DIAGNOSIS — R351 Nocturia: Secondary | ICD-10-CM | POA: Diagnosis not present

## 2018-10-15 DIAGNOSIS — I493 Ventricular premature depolarization: Secondary | ICD-10-CM | POA: Diagnosis not present

## 2018-10-15 DIAGNOSIS — E519 Thiamine deficiency, unspecified: Secondary | ICD-10-CM

## 2018-10-15 DIAGNOSIS — Z0000 Encounter for general adult medical examination without abnormal findings: Secondary | ICD-10-CM

## 2018-10-15 LAB — CBC WITH DIFFERENTIAL/PLATELET
BASOS ABS: 0.1 10*3/uL (ref 0.0–0.1)
Basophils Relative: 0.9 % (ref 0.0–3.0)
Eosinophils Absolute: 0.3 10*3/uL (ref 0.0–0.7)
Eosinophils Relative: 3.5 % (ref 0.0–5.0)
HCT: 39.6 % (ref 39.0–52.0)
Hemoglobin: 13.3 g/dL (ref 13.0–17.0)
LYMPHS ABS: 1.6 10*3/uL (ref 0.7–4.0)
Lymphocytes Relative: 19.3 % (ref 12.0–46.0)
MCHC: 33.5 g/dL (ref 30.0–36.0)
MCV: 92 fl (ref 78.0–100.0)
MONO ABS: 0.8 10*3/uL (ref 0.1–1.0)
MONOS PCT: 9.3 % (ref 3.0–12.0)
NEUTROS ABS: 5.7 10*3/uL (ref 1.4–7.7)
Neutrophils Relative %: 67 % (ref 43.0–77.0)
PLATELETS: 265 10*3/uL (ref 150.0–400.0)
RBC: 4.3 Mil/uL (ref 4.22–5.81)
RDW: 13.8 % (ref 11.5–15.5)
WBC: 8.5 10*3/uL (ref 4.0–10.5)

## 2018-10-15 LAB — POCT GLYCOSYLATED HEMOGLOBIN (HGB A1C): HbA1c, POC (prediabetic range): 5.9 % (ref 5.7–6.4)

## 2018-10-15 LAB — COMPREHENSIVE METABOLIC PANEL
ALBUMIN: 4.5 g/dL (ref 3.5–5.2)
ALT: 20 U/L (ref 0–53)
AST: 19 U/L (ref 0–37)
Alkaline Phosphatase: 59 U/L (ref 39–117)
BILIRUBIN TOTAL: 0.5 mg/dL (ref 0.2–1.2)
BUN: 13 mg/dL (ref 6–23)
CALCIUM: 9.7 mg/dL (ref 8.4–10.5)
CHLORIDE: 103 meq/L (ref 96–112)
CO2: 29 meq/L (ref 19–32)
CREATININE: 1.32 mg/dL (ref 0.40–1.50)
GFR: 68.4 mL/min (ref >60.00)
GLUCOSE: 107 mg/dL — AB (ref 70–99)
Potassium: 3.7 mEq/L (ref 3.5–5.1)
SODIUM: 141 meq/L (ref 135–145)
TOTAL PROTEIN: 7.5 g/dL (ref 6.0–8.3)

## 2018-10-15 LAB — LIPID PANEL
Cholesterol: 160 mg/dL (ref 0–200)
HDL: 61.2 mg/dL (ref >39.00)
LDL CALC: 77 mg/dL (ref 0–99)
NONHDL: 98.69
Total CHOL/HDL Ratio: 3
Triglycerides: 106 mg/dL (ref 0.0–149.0)
VLDL: 21.2 mg/dL (ref 0.0–40.0)

## 2018-10-15 LAB — URINALYSIS, ROUTINE W REFLEX MICROSCOPIC
Bilirubin Urine: NEGATIVE
Hgb urine dipstick: NEGATIVE
Ketones, ur: NEGATIVE
Leukocytes, UA: NEGATIVE
NITRITE: NEGATIVE
PH: 7 (ref 5.0–8.0)
SPECIFIC GRAVITY, URINE: 1.02 (ref 1.000–1.030)
TOTAL PROTEIN, URINE-UPE24: 30 — AB
URINE GLUCOSE: NEGATIVE
Urobilinogen, UA: 0.2 (ref 0.0–1.0)

## 2018-10-15 LAB — VITAMIN D 25 HYDROXY (VIT D DEFICIENCY, FRACTURES): VITD: 34.37 ng/mL (ref 30.00–100.00)

## 2018-10-15 LAB — TSH: TSH: 2.88 u[IU]/mL (ref 0.35–4.50)

## 2018-10-15 LAB — PSA: PSA: 2.34 ng/mL (ref 0.10–4.00)

## 2018-10-15 MED ORDER — FLUTICASONE PROPIONATE 50 MCG/ACT NA SUSP: 2.0000 | Freq: Every day | NASAL | 1 refills | Status: AC

## 2018-10-15 MED ORDER — AZILSARTAN-CHLORTHALIDONE 40-12.5 MG PO TABS: 1.0000 | ORAL_TABLET | Freq: Every day | ORAL | 1 refills | Status: DC

## 2018-10-15 MED ORDER — AZILSARTAN MEDOXOMIL 80 MG PO TABS: 1.0000 | ORAL_TABLET | Freq: Every day | ORAL | 1 refills | Status: DC

## 2018-10-15 MED ORDER — OXYBUTYNIN CHLORIDE 5 MG PO TABS: 5.0000 mg | ORAL_TABLET | Freq: Two times a day (BID) | ORAL | 1 refills | Status: DC

## 2018-10-15 MED FILL — OXYBUTYNIN CHLORIDE 5 MG TA: 5 | 90 days supply | Qty: 180 | Fill #0

## 2018-10-15 MED FILL — FLUTICASONE PROP 50 MCG SPR: 50 | 30 days supply | Qty: 16 | Fill #0

## 2018-10-15 NOTE — Progress Notes (Signed)
Subjective:  Patient ID: Ronald Bond, male    DOB: Jan 17, 1946  Age: 72 y.o. MRN: [id number]  CC: Hypertension; Hyperlipidemia; and Annual Exam   HPI Ronald Bond presents for a CPX.  He complains that his blood pressure has not been well controlled and he has had a few headaches recently.  He says his blood pressure at home is usually 170-180/80-90.  He is compliant with the amlodipine and losartan.  He denies any recent episodes of CP, DOE, palpitations, edema, or fatigue.  Past Medical History:  Diagnosis Date  . GERD (gastroesophageal reflux disease)   . HTN (hypertension)   . Hyperlipidemia   . Hypothyroidism   . Osteoarthritis   . Wears glasses    Past Surgical History:  Procedure Laterality Date  . COLONOSCOPY    . EYE SURGERY     both cataracts  . MICROLARYNGOSCOPY Left 11/18/2013   Procedure: MICROLARYNGOSCOPY WITH REMOVAL OF GRANULOMA LEFT SIDE;  Surgeon: Izora Gala, MD;  Location: [facility];  Service: ENT;  Laterality: Left;  . THYROIDECTOMY  1974  . TONSILLECTOMY    . TOTAL KNEE ARTHROPLASTY  2009   left    reports that he has never smoked. He has never used smokeless tobacco. He reports that he does not drink alcohol or use drugs. family history includes Brain cancer in his mother; Heart disease in his brother and father. Allergies  Allergen Reactions  . Lisinopril Other (See Comments)    Edema in face    Outpatient Medications Prior to Visit  Medication Sig Dispense Refill  . amLODipine (NORVASC) 10 MG tablet Take 1 tablet (10 mg total) by mouth daily. 90 tablet 1  . aspirin 81 MG EC tablet Take 81 mg by mouth daily.      Marland Kitchen atorvastatin (LIPITOR) 40 MG tablet TAKE 1 TABLET (40 MG TOTAL) BY MOUTH DAILY. 90 tablet 1  . cromolyn (OPTICROM) 4 % ophthalmic solution     . ferrous sulfate 325 (65 FE) MG tablet Take 1 tablet (325 mg total) by mouth 2 (two) times daily with a meal. 180 tablet 1  . HYDROcodone-acetaminophen (NORCO) 10-325  MG tablet Take 1 tablet by mouth every 8 (eight) hours as needed. 90 tablet 0  . levothyroxine (SYNTHROID, LEVOTHROID) 125 MCG tablet TAKE 1 TABLET BY MOUTH EVERY DAY 90 tablet 0  . losartan (COZAAR) 100 MG tablet TAKE 1 TABLET BY MOUTH DAILY    . mometasone-formoterol (DULERA) 100-5 MCG/ACT AERO Inhale 2 puffs into the lungs 2 (two) times daily. 13 g 11  . Multiple Vitamin (MULTIVITAMIN) tablet Take 1 tablet by mouth daily.    Marland Kitchen omeprazole (PRILOSEC) 20 MG capsule TAKE 1 CAPSULE (20 MG TOTAL) BY MOUTH DAILY. 30 capsule 0  . tamsulosin (FLOMAX) 0.4 MG CAPS capsule TAKE 1 CAPSULE BY MOUTH EVERY DAY AFTER DINNER 90 capsule 1  . thiamine (VITAMIN B-1) 50 MG tablet Take 1 tablet (50 mg total) by mouth daily. 90 tablet 1  . Vitamin D, Ergocalciferol, (DRISDOL) 50000 units CAPS capsule Take 1 capsule (50,000 Units total) by mouth every 7 (seven) days. 12 capsule 0  . oxybutynin (DITROPAN) 5 MG tablet      No facility-administered medications prior to visit.     ROS Review of Systems  Constitutional: Negative for appetite change, diaphoresis, fatigue and unexpected weight change.  HENT: Negative.   Eyes: Negative.   Respiratory: Negative.  Negative for cough, chest tightness, shortness of breath and wheezing.  Cardiovascular: Negative for chest pain, palpitations and leg swelling.  Gastrointestinal: Negative for abdominal pain, constipation, diarrhea and nausea.  Endocrine: Negative for cold intolerance and heat intolerance.  Genitourinary: Positive for frequency. Negative for difficulty urinating, flank pain, penile swelling, scrotal swelling, testicular pain and urgency.  Musculoskeletal: Negative.  Negative for arthralgias and myalgias.  Skin: Negative.  Negative for color change, pallor and rash.  Neurological: Positive for headaches. Negative for dizziness, weakness, light-headedness and numbness.  Hematological: Negative for adenopathy. Does not bruise/bleed easily.    Psychiatric/Behavioral: Negative.     Objective:  BP (!) 170/70 (BP Location: Left Arm, Patient Position: Sitting, Cuff Size: Large)   Pulse (!) 53   Temp 98.1 F (36.7 C) (Oral)   Resp 16   Ht 5\' 10"  (1.778 m)   Wt 224 lb (101.6 kg)   SpO2 98%   BMI 32.14 kg/m   BP Readings from Last 3 Encounters:  10/15/18 (!) 170/70  09/14/18 (!) 144/86  08/17/18 (!) 197/82    Wt Readings from Last 3 Encounters:  10/15/18 224 lb (101.6 kg)  09/14/18 219 lb (99.3 kg)  06/07/18 210 lb (95.3 kg)    Physical Exam  Constitutional: He is oriented to person, place, and time. No distress.  HENT:  Mouth/Throat: Oropharynx is clear and moist. No oropharyngeal exudate.  Eyes: Conjunctivae are normal. No scleral icterus.  Neck: Normal range of motion. Neck supple. No JVD present. No thyromegaly present.  Cardiovascular: Normal rate, regular rhythm, S1 normal and S2 normal.  Occasional extrasystoles are present. Exam reveals no gallop.  No murmur heard. EKG ----  Sinus  Bradycardia  WITHIN NORMAL LIMITS  Pulmonary/Chest: Effort normal and breath sounds normal. No respiratory distress. He has no wheezes. He has no rhonchi. He has no rales.  Abdominal: Soft. Bowel sounds are normal. He exhibits no mass. There is no tenderness. Hernia confirmed negative in the right inguinal area and confirmed negative in the left inguinal area.  Genitourinary: Testes normal and penis normal. Rectal exam shows no external hemorrhoid, no internal hemorrhoid, no fissure, no mass, no tenderness, anal tone normal and guaiac negative stool. Prostate is enlarged (1+ smooth symm BPH). Prostate is not tender. Right testis shows no mass, no swelling and no tenderness. Left testis shows no mass, no swelling and no tenderness. Circumcised. No penile erythema or penile tenderness. No discharge found.  Musculoskeletal: Normal range of motion. He exhibits no edema, tenderness or deformity.  Lymphadenopathy:    He has no cervical  adenopathy. No inguinal adenopathy noted on the right or left side.  Neurological: He is alert and oriented to person, place, and time.  Skin: Skin is warm and dry. No rash noted. He is not diaphoretic.  Psychiatric: He has a normal mood and affect. His behavior is normal. Judgment and thought content normal.  Vitals reviewed.   Lab Results  Component Value Date   WBC 8.5 10/15/2018   HGB 13.3 10/15/2018   HCT 39.6 10/15/2018   PLT 265.0 10/15/2018   GLUCOSE 107 (H) 10/15/2018   CHOL 160 10/15/2018   TRIG 106.0 10/15/2018   HDL 61.20 10/15/2018   LDLDIRECT 75.0 03/08/2016   LDLCALC 77 10/15/2018   ALT 20 10/15/2018   AST 19 10/15/2018   NA 141 10/15/2018   K 3.7 10/15/2018   CL 103 10/15/2018   CREATININE 1.32 10/15/2018   BUN 13 10/15/2018   CO2 29 10/15/2018   TSH 2.88 10/15/2018   PSA 2.34 10/15/2018  INR 0.9 10/17/2007   HGBA1C 5.9 10/15/2018    Dg Inject Diag/thera/inc Needle/cath/plc Epi/cerv/thor W/img  Result Date: 08/17/2018 CLINICAL DATA:  Spondylosis without myelopathy. Right-sided facet arthropathy also present. Good response to previous injection with nearly a year of relief. FLUOROSCOPY TIME:  0 minutes 35 seconds. 15.04 micro gray meter squared PROCEDURE: CERVICAL EPIDURAL INJECTION An interlaminar approach was performed on the right at C7-T1. A 20 gauge epidural needle was advanced using loss-of-resistance technique. DIAGNOSTIC EPIDURAL INJECTION Injection of Isovue-M 300 shows a good epidural pattern with spread above and below the level of needle placement, primarily on the right. No vascular opacification is seen. THERAPEUTIC EPIDURAL INJECTION 1.5 ml of Kenalog 40 mixed with 1 ml of 1% Lidocaine and 2 ml of normal saline were then instilled. The procedure was well-tolerated, and the patient was discharged thirty minutes following the injection in good condition. IMPRESSION: Technically successful repeat epidural injection on the right at C7-T1. Electronically  Signed   By: Nelson Chimes M.D.   On: 08/17/2018 08:59    Assessment & Plan:   Habeeb was seen today for hypertension, hyperlipidemia and annual exam.  Diagnoses and all orders for this visit:  Acquired hypothyroidism- His TSH is in the normal range.  He will remain on the current dose of levothyroxine. -     TSH; Future  Essential hypertension, benign- His blood pressure is not adequately well controlled and he is symptomatic.  Will continue amlodipine at the current dose.  Will upgrade him to a more potent ARB and will add on a thiazide diuretic. -     Comprehensive metabolic panel; Future -     VITAMIN D 25 Hydroxy (Vit-D Deficiency, Fractures); Future -     EKG 12-Lead -     Discontinue: Azilsartan Medoxomil (EDARBI) 80 MG TABS; Take 1 tablet (80 mg total) by mouth daily. -     Azilsartan-Chlorthalidone (EDARBYCLOR) 40-12.5 MG TABS; Take 1 tablet by mouth daily.  Hypertensive heart disease without CHF (congestive heart failure)- His volume status is normal.  See above. -     Comprehensive metabolic panel; Future -     Discontinue: Azilsartan Medoxomil (EDARBI) 80 MG TABS; Take 1 tablet (80 mg total) by mouth daily. -     Azilsartan-Chlorthalidone (EDARBYCLOR) 40-12.5 MG TABS; Take 1 tablet by mouth daily.  BPH associated with nocturia- His PSA is in the normal range which is reassuring that he does not have prostate cancer.  He has no symptoms that need to be treated. -     PSA; Future -     Urinalysis, Routine w reflex microscopic; Future  Hyperlipidemia with target LDL less than 100- He has achieved his LDL goal and is doing well on the statin. -     Lipid panel; Future  Iron deficiency anemia secondary to inadequate dietary iron intake -     CBC with Differential/Platelet; Future  Prediabetes- His A1c is at 5.9%.  Medical therapy is not indicated. -     POCT glycosylated hemoglobin (Hb A1C)  Thiamine deficiency-I will monitor his thiamine level. -     CBC with  Differential/Platelet; Future -     Vitamin B1; Future  Routine general medical examination at a health care facility  OAB (overactive bladder) -     oxybutynin (DITROPAN) 5 MG tablet; Take 1 tablet (5 mg total) by mouth 2 (two) times daily.  Frequent PVCs- He is asymptomatic with this.  Medical therapy is not indicated.  Seasonal allergic rhinitis  due to pollen -     fluticasone (FLONASE) 50 MCG/ACT nasal spray; Place 2 sprays into both nostrils daily.   I have discontinued Carey Bullocks Azilsartan Medoxomil. I have also changed his oxybutynin. Additionally, I am having him start on fluticasone and Azilsartan-Chlorthalidone. Lastly, I am having him maintain his aspirin, mometasone-formoterol, cromolyn, thiamine, multivitamin, omeprazole, ferrous sulfate, Vitamin D (Ergocalciferol), HYDROcodone-acetaminophen, atorvastatin, amLODipine, levothyroxine, tamsulosin, and losartan.  Meds ordered this encounter  Medications  . oxybutynin (DITROPAN) 5 MG tablet    Sig: Take 1 tablet (5 mg total) by mouth 2 (two) times daily.    Dispense:  180 tablet    Refill:  1  . fluticasone (FLONASE) 50 MCG/ACT nasal spray    Sig: Place 2 sprays into both nostrils daily.    Dispense:  48 g    Refill:  1  . DISCONTD: Azilsartan Medoxomil (EDARBI) 80 MG TABS    Sig: Take 1 tablet (80 mg total) by mouth daily.    Dispense:  90 tablet    Refill:  1  . Azilsartan-Chlorthalidone (EDARBYCLOR) 40-12.5 MG TABS    Sig: Take 1 tablet by mouth daily.    Dispense:  90 tablet    Refill:  1   See AVS for instructions about healthy living and anticipatory guidance.  Follow-up: Return in about 6 weeks (around 11/26/2018).  Scarlette Calico, MD

## 2018-10-15 NOTE — Patient Instructions (Signed)

## 2018-10-16 ENCOUNTER — Encounter: Payer: Self-pay | Admitting: Internal Medicine

## 2018-10-16 ENCOUNTER — Other Ambulatory Visit: Payer: Self-pay | Admitting: Internal Medicine

## 2018-10-16 DIAGNOSIS — M75120 Complete rotator cuff tear or rupture of unspecified shoulder, not specified as traumatic: Secondary | ICD-10-CM

## 2018-10-16 DIAGNOSIS — N410 Acute prostatitis: Secondary | ICD-10-CM

## 2018-10-16 DIAGNOSIS — M5412 Radiculopathy, cervical region: Secondary | ICD-10-CM

## 2018-10-16 DIAGNOSIS — M5126 Other intervertebral disc displacement, lumbar region: Secondary | ICD-10-CM

## 2018-10-16 DIAGNOSIS — M4802 Spinal stenosis, cervical region: Secondary | ICD-10-CM

## 2018-10-16 DIAGNOSIS — I119 Hypertensive heart disease without heart failure: Secondary | ICD-10-CM

## 2018-10-16 DIAGNOSIS — M1711 Unilateral primary osteoarthritis, right knee: Secondary | ICD-10-CM

## 2018-10-16 DIAGNOSIS — I10 Essential (primary) hypertension: Secondary | ICD-10-CM

## 2018-10-16 MED ORDER — SULFAMETHOXAZOLE-TRIMETHOPRIM 800-160 MG PO TABS: 1.0000 | ORAL_TABLET | Freq: Two times a day (BID) | ORAL | 0 refills | Status: AC

## 2018-10-16 MED ORDER — LOSARTAN POTASSIUM-HCTZ 100-12.5 MG PO TABS: 1.0000 | ORAL_TABLET | Freq: Every day | ORAL | 1 refills | Status: DC

## 2018-10-18 ENCOUNTER — Telehealth: Payer: Self-pay | Admitting: Internal Medicine

## 2018-10-18 NOTE — Telephone Encounter (Signed)
Copied from [place] [id number]. Topic: Quick Communication - Rx Refill/Question >> Oct 18, 2018  5:13 PM Cecelia Byars, NT wrote: Medication:  losartan-hydrochlorothiazide (HYZAAR) 100-12.5 MG tablet , pharmacy states this medication is on back order and would like for the Dr change the prescription into 2  medications for the same dose age   Has the patient contacted their pharmacy? yes  (Agent: If no, request that the patient contact the pharmacy for the refill. (Agent: If yes, when and what did the pharmacy advise?  Preferred Pharmacy (with phone number or street name  [facility] [place] [facility] [address] [phone] (Phone) [phone] (Fax)    Agent: Please be advised that RX refills may take up to 3 business days. We ask that you follow-up with your pharmacy.

## 2018-10-18 NOTE — Telephone Encounter (Signed)
Pharmacy requesting two separate prescriptions for Losartan and Hydrochlorothiazide due to Hyzaar being on back order. [facility] [place] [facility] [address]

## 2018-10-19 ENCOUNTER — Other Ambulatory Visit: Payer: Self-pay

## 2018-10-19 LAB — VITAMIN B1: Vitamin B1 (Thiamine): 9 nmol/L (ref 8–30)

## 2018-10-19 MED ORDER — LOSARTAN POTASSIUM 100 MG PO TABS: 100.0000 mg | ORAL_TABLET | Freq: Every day | ORAL | 0 refills | Status: DC

## 2018-10-19 MED ORDER — HYDROCHLOROTHIAZIDE 12.5 MG PO TABS: 12.5000 mg | ORAL_TABLET | Freq: Every day | ORAL | 0 refills | Status: DC

## 2018-10-19 NOTE — Telephone Encounter (Signed)
erx sent as requested.  Pt informed of same.

## 2018-10-24 ENCOUNTER — Other Ambulatory Visit: Payer: Self-pay | Admitting: Family Medicine

## 2018-10-24 DIAGNOSIS — G8929 Other chronic pain: Secondary | ICD-10-CM

## 2018-10-24 DIAGNOSIS — M25511 Pain in right shoulder: Principal | ICD-10-CM

## 2018-10-26 MED FILL — LEVOTHYROXINE 125 MCG TAB: 125 | 60 days supply | Qty: 60 | Fill #1

## 2018-10-27 ENCOUNTER — Other Ambulatory Visit: Payer: MEDICARE

## 2018-10-29 ENCOUNTER — Ambulatory Visit
Admission: RE | Admit: 2018-10-29 | Discharge: 2018-10-29 | Disposition: A | Discharge status: Home / Self Care | Payer: MEDICARE | Source: Ambulatory Visit | Attending: Family Medicine | Admitting: Family Medicine

## 2018-10-29 ENCOUNTER — Inpatient Hospital Stay
Admission: RE | Admit: 2018-10-29 | Discharge: 2018-10-29 | Disposition: A | Discharge status: Home / Self Care | Payer: MEDICARE | Source: Ambulatory Visit | Attending: Family Medicine | Admitting: Family Medicine

## 2018-10-29 DIAGNOSIS — M25511 Pain in right shoulder: Principal | ICD-10-CM

## 2018-10-29 DIAGNOSIS — M75121 Complete rotator cuff tear or rupture of right shoulder, not specified as traumatic: Secondary | ICD-10-CM | POA: Diagnosis not present

## 2018-10-29 DIAGNOSIS — G8929 Other chronic pain: Secondary | ICD-10-CM

## 2018-10-29 MED ORDER — IOPAMIDOL (ISOVUE-M 200) INJECTION 41%
15.0000 mL | Freq: Once | INTRAMUSCULAR | Status: AC
  Administered 2018-10-29: 15 mL via INTRA_ARTICULAR

## 2018-10-30 ENCOUNTER — Other Ambulatory Visit: Payer: Self-pay | Admitting: Internal Medicine

## 2018-10-30 ENCOUNTER — Encounter: Payer: Self-pay | Admitting: Family Medicine

## 2018-10-30 DIAGNOSIS — I119 Hypertensive heart disease without heart failure: Secondary | ICD-10-CM

## 2018-10-30 DIAGNOSIS — I10 Essential (primary) hypertension: Secondary | ICD-10-CM

## 2018-10-30 MED ORDER — LOSARTAN POTASSIUM 100 MG PO TABS: 100.0000 mg | ORAL_TABLET | Freq: Every day | ORAL | 0 refills | Status: DC

## 2018-10-30 MED ORDER — HYDROCHLOROTHIAZIDE 12.5 MG PO TABS: 12.5000 mg | ORAL_TABLET | Freq: Every day | ORAL | 0 refills | Status: DC

## 2018-11-14 ENCOUNTER — Ambulatory Visit: Payer: MEDICARE | Admitting: Family Medicine

## 2018-11-29 ENCOUNTER — Other Ambulatory Visit: Payer: Self-pay | Admitting: Internal Medicine

## 2018-11-29 DIAGNOSIS — I119 Hypertensive heart disease without heart failure: Secondary | ICD-10-CM

## 2018-11-29 DIAGNOSIS — I10 Essential (primary) hypertension: Secondary | ICD-10-CM

## 2018-11-29 NOTE — Telephone Encounter (Signed)
Requested medication (s) are due for refill today -early- but pharmacy is requesting RF- patient has none  Requested medication (s) are on the active medication list -yes  Future visit scheduled -yes ( with Z Tamala Julian)  Last refill: 10/30/18  Notes to clinic: patient was seen recently- BP fails protocol-    Patient had RF- December for 3 month- requesting early  Requested Prescriptions  Pending Prescriptions Disp Refills   losartan (COZAAR) 100 MG tablet 90 tablet 0    Sig: Take 1 tablet (100 mg total) by mouth daily.     Cardiovascular:  Angiotensin Receptor Blockers Failed - 11/29/2018 10:57 AM      Failed - Last BP in normal range    BP Readings from Last 1 Encounters:  10/15/18 (!) 170/70         Passed - Cr in normal range and within 180 days    Creatinine, Ser  Date Value Ref Range Status  10/15/2018 1.32 0.40 - 1.50 mg/dL Final         Passed - K in normal range and within 180 days    Potassium  Date Value Ref Range Status  10/15/2018 3.7 3.5 - 5.1 mEq/L Final         Passed - Patient is not pregnant      Passed - Valid encounter within last 6 months    Recent Outpatient Visits          1 month ago Acquired hypothyroidism   [place], Thomas L, MD   2 months ago Chronic right shoulder pain   [place], [place], DO   5 months ago Chronic right shoulder pain   [place], [place], DO   7 months ago Chronic right shoulder pain   [place], [place], DO   7 months ago Acute pain of right shoulder   [place], Cedric W, MD      Future Appointments            In 1 week  [place], [place]   In 3 weeks Lyndal Pulley, [place], [facility]            Requested Prescriptions  Pending Prescriptions Disp Refills   losartan (COZAAR) 100 MG  tablet 90 tablet 0    Sig: Take 1 tablet (100 mg total) by mouth daily.     Cardiovascular:  Angiotensin Receptor Blockers Failed - 11/29/2018 10:57 AM      Failed - Last BP in normal range    BP Readings from Last 1 Encounters:  10/15/18 (!) 170/70         Passed - Cr in normal range and within 180 days    Creatinine, Ser  Date Value Ref Range Status  10/15/2018 1.32 0.40 - 1.50 mg/dL Final         Passed - K in normal range and within 180 days    Potassium  Date Value Ref Range Status  10/15/2018 3.7 3.5 - 5.1 mEq/L Final         Passed - Patient is not pregnant      Passed - Valid encounter within last 6 months    Recent Outpatient Visits          1 month ago Acquired hypothyroidism   [place],  MD   2 months ago Chronic right shoulder pain   [place], [place], DO   5 months ago Chronic right shoulder pain   [place], [place], DO   7 months ago Chronic right shoulder pain   [place], [place], DO   7 months ago Acute pain of right shoulder   [facility] -Georges Mouse, MD      Future Appointments            In 1 week  [place], [place]   In 3 weeks Lyndal Pulley, [place], [place]

## 2018-11-29 NOTE — Telephone Encounter (Signed)
Copied from [place] [id number]. Topic: Quick Communication - Rx Refill/Question >> Nov 29, 2018 10:50 AM Bea Graff, NT wrote: Medication: losartan (COZAAR) 100 MG tablet  Has the patient contacted their pharmacy? Yes.   (Agent: If no, request that the patient contact the pharmacy for the refill.) (Agent: If yes, when and what did the pharmacy advise?)  Preferred Pharmacy (with phone number or street name): [facility] [place] [facility] [place] [phone] (Phone) [phone] (Fax)    Agent: Please be advised that RX refills may take up to 3 business days. We ask that you follow-up with your pharmacy.

## 2018-12-04 ENCOUNTER — Ambulatory Visit: Payer: MEDICARE | Admitting: Family Medicine

## 2018-12-05 ENCOUNTER — Ambulatory Visit: Payer: MEDICARE

## 2018-12-06 NOTE — Progress Notes (Signed)
Subjective:   Ronald Bond is a 73 y.o. male who presents for Medicare Annual/Subsequent preventive examination.  Review of Systems:  No ROS.  Medicare Wellness Visit. Additional risk factors are reflected in the social history.  Cardiac Risk Factors include: advanced age (>55men, >36 women);dyslipidemia;hypertension;male gender Sleep patterns: has interrupted sleep, gets up 3-4 times nightly to void and sleeps 5-6 hours nightly. Discussed with patient to stop drinking several hours before going to bed to decrease the amount of times he gets up to void.  Home Safety/Smoke Alarms: Feels safe in home. Smoke alarms in place.  Living environment; residence and Firearm Safety: 1-story house/ trailer, no firearms. Lives with wife, no needs for DME, good support system Seat Belt Safety/Bike Helmet: Wears seat belt. PSA-  Lab Results  Component Value Date   PSA 2.34 10/15/2018   PSA 2.00 06/01/2017   PSA 3.50 12/28/2016       Objective:    Vitals: BP (!) 142/78   Pulse (!) 53   Resp 18   Ht 5\' 10"  (1.778 m)   Wt 221 lb (100.2 kg)   SpO2 100%   BMI 31.71 kg/m   Body mass index is 31.71 kg/m.  Advanced Directives 12/07/2018 11/30/2017 11/29/2016 11/18/2013 11/12/2013  Does Patient Have a Medical Advance Directive? No No No - Patient does not have advance directive;Patient would like information  Would patient like information on creating a medical advance directive? Yes (ED - Information included in AVS) Yes (ED - Information included in AVS) Yes (MAU/Ambulatory/Procedural Areas - Information given) - -  Pre-existing out of facility DNR order (yellow form or pink MOST form) - - - No -    Tobacco Social History   Tobacco Use  Smoking Status Never Smoker  Smokeless Tobacco Never Used     Counseling given: Not Answered  Past Medical History:  Diagnosis Date  . GERD (gastroesophageal reflux disease)   . HTN (hypertension)   . Hyperlipidemia   . Hypothyroidism   .  Osteoarthritis   . Wears glasses    Past Surgical History:  Procedure Laterality Date  . COLONOSCOPY    . EYE SURGERY     both cataracts  . MICROLARYNGOSCOPY Left 11/18/2013   Procedure: MICROLARYNGOSCOPY WITH REMOVAL OF GRANULOMA LEFT SIDE;  Surgeon: Izora Gala, MD;  Location: [facility];  Service: ENT;  Laterality: Left;  . THYROIDECTOMY  1974  . TONSILLECTOMY    . TOTAL KNEE ARTHROPLASTY  2009   left   Family History  Problem Relation Age of Onset  . Brain cancer Mother   . Heart disease Father   . Heart disease Brother    Social History   Socioeconomic History  . Marital status: Married    Spouse name: Not on file  . Number of children: 2  . Years of education: Not on file  . Highest education level: Not on file  Occupational History  . 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 Financial resource strain: Not hard at all  . Food insecurity:    Worry: Never true    Inability: Never true  . Transportation needs:    Medical: No    Non-medical: No  Tobacco Use  . Smoking status: Never Smoker  . Smokeless tobacco: Never Used  Substance and Sexual Activity  . Alcohol use: Yes    Comment: occassionally  . Drug use: No  . Sexual activity: Yes  Lifestyle  . Physical activity:    Days per  week: 0 days    Minutes per session: 0 min  . Stress: Not at all  Relationships  . Social connections:    Talks on phone: More than three times a week    Gets together: More than three times a week    Attends religious service: More than 4 times per year    Active member of club or organization: Yes    Attends meetings of clubs or organizations: More than 4 times per year    Relationship status: Married  Other Topics Concern  . Not on file  Social History Narrative   Regular Exercise -  YES          Outpatient Encounter Medications as of 12/07/2018  Medication Sig  . amLODipine (NORVASC) 10 MG tablet Take 1 tablet (10 mg total) by mouth daily.  Marland Kitchen aspirin 81  MG EC tablet Take 81 mg by mouth daily.    Marland Kitchen atorvastatin (LIPITOR) 40 MG tablet TAKE 1 TABLET (40 MG TOTAL) BY MOUTH DAILY.  . cromolyn (OPTICROM) 4 % ophthalmic solution   . fluticasone (FLONASE) 50 MCG/ACT nasal spray Place 2 sprays into both nostrils daily.  . hydrochlorothiazide (HYDRODIURIL) 12.5 MG tablet Take 1 tablet (12.5 mg total) by mouth daily.  Marland Kitchen HYDROcodone-acetaminophen (NORCO) 10-325 MG tablet Take 1 tablet by mouth every 8 (eight) hours as needed.  Marland Kitchen levothyroxine (SYNTHROID, LEVOTHROID) 125 MCG tablet TAKE 1 TABLET BY MOUTH EVERY DAY  . loratadine (CLARITIN) 10 MG tablet Take 10 mg by mouth daily.  Marland Kitchen losartan (COZAAR) 100 MG tablet Take 1 tablet (100 mg total) by mouth daily.  . mometasone-formoterol (DULERA) 100-5 MCG/ACT AERO Inhale 2 puffs into the lungs 2 (two) times daily.  . Multiple Vitamin (MULTIVITAMIN) tablet Take 1 tablet by mouth daily.  Marland Kitchen omeprazole (PRILOSEC) 20 MG capsule TAKE 1 CAPSULE (20 MG TOTAL) BY MOUTH DAILY.  Marland Kitchen oxybutynin (DITROPAN) 5 MG tablet Take 1 tablet (5 mg total) by mouth 2 (two) times daily.  . tamsulosin (FLOMAX) 0.4 MG CAPS capsule TAKE 1 CAPSULE BY MOUTH EVERY DAY AFTER DINNER  . thiamine (VITAMIN B-1) 50 MG tablet Take 1 tablet (50 mg total) by mouth daily.  . [DISCONTINUED] ferrous sulfate 325 (65 FE) MG tablet Take 1 tablet (325 mg total) by mouth 2 (two) times daily with a meal. (Patient not taking: Reported on 12/07/2018)  . [DISCONTINUED] Vitamin D, Ergocalciferol, (DRISDOL) 50000 units CAPS capsule Take 1 capsule (50,000 Units total) by mouth every 7 (seven) days. (Patient not taking: Reported on 12/07/2018)   No facility-administered encounter medications on file as of 12/07/2018.     Activities of Daily Living In your present state of health, do you have any difficulty performing the following activities: 12/07/2018 10/15/2018  Hearing? N N  Vision? N N  Difficulty concentrating or making decisions? N N  Walking or climbing stairs?  N N  Dressing or bathing? N N  Doing errands, shopping? N N  Preparing Food and eating ? N -  Using the Toilet? N -  In the past six months, have you accidently leaked urine? N -  Do you have problems with loss of bowel control? N -  Managing your Medications? N -  Managing your Finances? N -  Housekeeping or managing your Housekeeping? N -  Some recent data might be hidden    Patient Care Team: Janith Lima, MD as PCP - General Leonie Man, MD as Consulting Physician (Cardiology) Franchot Gallo, MD as Consulting  Physician (Urology) Deneise Lever, MD as Consulting Physician (Pulmonary Disease) groat (Dentistry)   Assessment:   This is a routine wellness examination for Abisai. Physical assessment deferred to PCP.  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 Encouraged patient to increase daily water and healthy fluid intake.  Goals    . Patient Stated     I want to go back to the [facility] and start walking. I feel I can go and walk every other day. Decrease eating fatty food and drinking sugary drinks.    . Patient Stated     Increase my physical activity by going to the [facility].     . Weight (lb) < 200 lb (90.7 kg)     Would like to lose 20 pounds by increasing activity and making healthy food choices.        Fall Risk Fall Risk  12/07/2018 10/15/2018 04/30/2018 12/08/2017 11/30/2017  Falls in the past year? 0 0 No No No    Depression Screen PHQ 2/9 Scores 12/07/2018 10/15/2018 04/30/2018 12/08/2017  PHQ - 2 Score 0 0 0 0    Cognitive Function MMSE - Mini  Mental State Exam 11/30/2017  Orientation to time 5  Orientation to Place 5  Registration 3  Attention/ Calculation 5  Recall 1  Language- name 2 objects 2  Language- repeat 1  Language- follow 3 step command 3  Language- read & follow direction 1  Write a sentence 1  Copy design 1  Total score 28       Ad8 score reviewed for issues:  Issues making decisions: no  Less interest in hobbies / activities: no  Repeats questions, stories (family complaining): no  Trouble using ordinary gadgets (microwave, computer, phone):no  Forgets the month or year: no  Mismanaging finances: no  Remembering appts: no  Daily problems with thinking and/or memory: no Ad8 score is= 0  Immunization History  Administered Date(s) Administered  . Influenza Split 10/24/2011  . Influenza Whole 08/19/2009, 07/23/2010  . Influenza, High Dose Seasonal PF 10/21/2013, 09/27/2016, 09/07/2017, 09/14/2018  . Influenza,inj,Quad PF,6+ Mos 08/07/2014, 08/25/2015  . Pneumococcal Conjugate-13 08/25/2015  . Pneumococcal Polysaccharide-23 11/28/2004, 03/08/2016  . Td 11/28/2004  . Tdap 08/25/2015  . Zoster 02/24/2011    Qualifies for Shingles Vaccine. Education and Chief Financial Officer provided regarding Shingrix  Screening Tests Health Maintenance  Topic Date Due  . Fecal DNA (Cologuard)  03/21/2021  . TETANUS/TDAP  08/24/2025  . INFLUENZA VACCINE  Completed  . Hepatitis C Screening  Completed  . PNA vac Low Risk Adult  Completed      Plan:     Continue doing brain stimulating activities (puzzles, reading, adult coloring books, staying active) to keep memory sharp.   Continue to eat heart healthy diet (full of fruits, vegetables, whole grains, lean protein, water--limit salt, fat, and sugar intake) and increase physical activity as tolerated.  I have personally reviewed and noted the following in the patient's chart:   . Medical and social history . Use of alcohol, tobacco or illicit drugs   . Current medications and supplements . Functional ability and status . Nutritional status .  Physical activity . Advanced directives . List of other physicians . Vitals . Screenings to include cognitive, depression, and falls . Referrals and appointments  In addition, I have reviewed and discussed with patient certain preventive protocols, quality metrics, and best practice recommendations. A written personalized care plan for preventive services as well as general preventive health recommendations were provided to patient.     Michiel Cowboy, RN  12/07/2018

## 2018-12-07 ENCOUNTER — Other Ambulatory Visit: Payer: Self-pay | Admitting: Internal Medicine

## 2018-12-07 ENCOUNTER — Telehealth: Payer: Self-pay | Admitting: *Deleted

## 2018-12-07 ENCOUNTER — Ambulatory Visit (INDEPENDENT_AMBULATORY_CARE_PROVIDER_SITE_OTHER): Payer: Medicare Other | Admitting: *Deleted

## 2018-12-07 VITALS — BP 142/78 | HR 53 | Resp 18 | Ht 70.0 in | Wt 221.0 lb

## 2018-12-07 DIAGNOSIS — Z0000 Encounter for general adult medical examination without abnormal findings: Secondary | ICD-10-CM | POA: Diagnosis not present

## 2018-12-07 DIAGNOSIS — R351 Nocturia: Principal | ICD-10-CM

## 2018-12-07 DIAGNOSIS — J301 Allergic rhinitis due to pollen: Secondary | ICD-10-CM

## 2018-12-07 DIAGNOSIS — N401 Enlarged prostate with lower urinary tract symptoms: Secondary | ICD-10-CM

## 2018-12-07 MED ORDER — LEVOCETIRIZINE DIHYDROCHLORIDE 5 MG PO TABS: 5.0000 mg | ORAL_TABLET | Freq: Every evening | ORAL | 1 refills | Status: DC

## 2018-12-07 NOTE — Telephone Encounter (Signed)
RX sent and referral ordered  TJ

## 2018-12-07 NOTE — Progress Notes (Signed)
Medical screening examination/treatment/procedure(s) were performed by the Wellness Coach, RN. As primary care provider I was immediately available for consulation/collaboration. I agree with above documentation. Lecia Esperanza, NP  

## 2018-12-07 NOTE — Telephone Encounter (Addendum)
During AWV, patient stated that claritin has not been effective for his allergies and he would like to try prescription singulair. He also asked if PCP could send another referral for urology. He continues to have frequency issues and missed his initial scheduled appointment with Dr. Diona Fanti.

## 2018-12-07 NOTE — Patient Instructions (Addendum)
Continue doing brain stimulating activities (puzzles, reading, adult coloring books, staying active) to keep memory sharp.   Continue to eat heart healthy diet (full of fruits, vegetables, whole grains, lean protein, water--limit salt, fat, and sugar intake) and increase physical activity as tolerated.   Ronald Bond , Thank you for taking time to come for your Medicare Wellness Visit. I appreciate your ongoing commitment to your health goals. Please review the following plan we discussed and let me know if I can assist you in the future.   These are the goals we discussed: Goals    . Patient Stated     I want to go back to the [facility] and start walking. I feel I can go and walk every other day. Decrease eating fatty food and drinking sugary drinks.    . Patient Stated     Increase my physical activity by going to the [facility].     . Weight (lb) < 200 lb (90.7 kg)     Would like to lose 20 pounds by increasing activity and making healthy food choices.        This is a list of the screening recommended for you and due dates:  Health Maintenance  Topic Date Due  . Cologuard (Stool DNA test)  03/21/2021  . Tetanus Vaccine  08/24/2025  . Flu Shot  Completed  .  Hepatitis C: One time screening is recommended by Center for Disease Control  (CDC) for  adults born from [birth year] through 1965.   Completed  . Pneumonia vaccines  Completed    Health Maintenance, Male A healthy lifestyle and preventive care is important for your health and wellness. Ask your health care provider about what schedule of regular examinations is right for you. What should I know about weight and diet? Eat a Healthy Diet  Eat plenty of vegetables, fruits, whole grains, low-fat dairy products, and lean protein.  Do not eat a lot of foods high in solid fats, added sugars, or salt.  Maintain a Healthy Weight Regular exercise can help you achieve or maintain a healthy weight. You should:  Do at least 150 minutes of  exercise each week. The exercise should increase your heart rate and make you sweat (moderate-intensity exercise).  Do strength-training exercises at least twice a week. Watch Your Levels of Cholesterol and Blood Lipids  Have your blood tested for lipids and cholesterol every 5 years starting at 73 years of age. If you are at high risk for heart disease, you should start having your blood tested when you are 73 years old. You may need to have your cholesterol levels checked more often if: ? Your lipid or cholesterol levels are high. ? You are older than 73 years of age. ? You are at high risk for heart disease. What should I know about cancer screening? Many types of cancers can be detected early and may often be prevented. Lung Cancer  You should be screened every year for lung cancer if: ? You are a current smoker who has smoked for at least 30 years. ? You are a former smoker who has quit within the past 15 years.  Talk to your health care provider about your screening options, when you should start screening, and how often you should be screened. Colorectal Cancer  Routine colorectal cancer screening usually begins at 73 years of age and should be repeated every 5-10 years until you are 73 years old. You may need to be screened more often if  early forms of precancerous polyps or small growths are found. Your health care provider may recommend screening at an earlier age if you have risk factors for colon cancer.  Your health care provider may recommend using home test kits to check for hidden blood in the stool.  A small camera at the end of a tube can be used to examine your colon (sigmoidoscopy or colonoscopy). This checks for the earliest forms of colorectal cancer. Prostate and Testicular Cancer  Depending on your age and overall health, your health care provider may do certain tests to screen for prostate and testicular cancer.  Talk to your health care provider about any  symptoms or concerns you have about testicular or prostate cancer. Skin Cancer  Check your skin from head to toe regularly.  Tell your health care provider about any new moles or changes in moles, especially if: ? There is a change in a mole's size, shape, or color. ? You have a mole that is larger than a pencil eraser.  Always use sunscreen. Apply sunscreen liberally and repeat throughout the day.  Protect yourself by wearing long sleeves, pants, a wide-brimmed hat, and sunglasses when outside. What should I know about heart disease, diabetes, and high blood pressure?  If you are 74-44 years of age, have your blood pressure checked every 3-5 years. If you are 47 years of age or older, have your blood pressure checked every year. You should have your blood pressure measured twice-once when you are at a [facility] or clinic, and once when you are not at a [facility] or clinic. Record the average of the two measurements. To check your blood pressure when you are not at a [facility] or clinic, you can use: ? An automated blood pressure machine at a pharmacy. ? A home blood pressure monitor.  Talk to your health care provider about your target blood pressure.  If you are between 31-75 years old, ask your health care provider if you should take aspirin to prevent heart disease.  Have regular diabetes screenings by checking your fasting blood sugar level. ? If you are at a normal weight and have a low risk for diabetes, have this test once every three years after the age of 74. ? If you are overweight and have a high risk for diabetes, consider being tested at a younger age or more often.  A one-time screening for abdominal aortic aneurysm (AAA) by ultrasound is recommended for men aged 71-75 years who are current or former smokers. What should I know about preventing infection? Hepatitis B If you have a higher risk for hepatitis B, you should be screened for this virus. Talk with your health care  provider to find out if you are at risk for hepatitis B infection. Hepatitis C Blood testing is recommended for:  Everyone born from [birth year] through 1965.  Anyone with known risk factors for hepatitis C. Sexually Transmitted Diseases (STDs)  You should be screened each year for STDs including gonorrhea and chlamydia if: ? You are sexually active and are younger than 73 years of age. ? You are older than 73 years of age and your health care provider tells you that you are at risk for this type of infection. ? Your sexual activity has changed since you were last screened and you are at an increased risk for chlamydia or gonorrhea. Ask your health care provider if you are at risk.  Talk with your health care provider about whether you are at high  risk of being infected with HIV. Your health care provider may recommend a prescription medicine to help prevent HIV infection. What else can I do?  Schedule regular health, dental, and eye exams.  Stay current with your vaccines (immunizations).  Do not use any tobacco products, such as cigarettes, chewing tobacco, and e-cigarettes. If you need help quitting, ask your health care provider.  Limit alcohol intake to no more than 2 drinks per day. One drink equals 12 ounces of beer, 5 ounces of Orlyn Odonoghue, or 1 ounces of hard liquor.  Do not use street drugs.  Do not share needles.  Ask your health care provider for help if you need support or information about quitting drugs.  Tell your health care provider if you often feel depressed.  Tell your health care provider if you have ever been abused or do not feel safe at home. This information is not intended to replace advice given to you by your health care provider. Make sure you discuss any questions you have with your health care provider. Document Released: 05/12/2008 Document Revised: 07/13/2016 Document Reviewed: 08/18/2015 Elsevier Interactive Patient Education  2019 [employer].

## 2018-12-20 NOTE — Progress Notes (Deleted)
[name] [address], [place] [address] Phone: [phone] Subjective:    I'm seeing this patient by the request  of:    CC: Right shoulder pain  [id number]  Ronald Bond is a 73 y.o. male coming in with complaint of right shoulder pain.     Patient did have an MRI of the shoulder.  Found to have a full-thickness near full width tear with 1.7 cm of retraction with few posterior fibers remaining intact of the supraspinatus.  Very mild arthritic changes of the acromioclavicular and glenohumeral joint  Past Medical History:  Diagnosis Date  . GERD (gastroesophageal reflux disease)   . HTN (hypertension)   . Hyperlipidemia   . Hypothyroidism   . Osteoarthritis   . Wears glasses    Past Surgical History:  Procedure Laterality Date  . COLONOSCOPY    . EYE SURGERY     both cataracts  . MICROLARYNGOSCOPY Left 11/18/2013   Procedure: MICROLARYNGOSCOPY WITH REMOVAL OF GRANULOMA LEFT SIDE;  Surgeon: Izora Gala, MD;  Location: [facility];  Service: ENT;  Laterality: Left;  . THYROIDECTOMY  1974  . TONSILLECTOMY    . TOTAL KNEE ARTHROPLASTY  2009   left   Social History   Socioeconomic History  . Marital status: Married    Spouse name: Not on file  . Number of children: 2  . Years of education: Not on file  . Highest education level: Not on file  Occupational History  . 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 Financial resource strain: Not hard at all  . Food insecurity:    Worry: Never true    Inability: Never true  . Transportation needs:    Medical: No    Non-medical: No  Tobacco Use  . Smoking status: Never Smoker  . Smokeless tobacco: Never Used  Substance and Sexual Activity  . Alcohol use: Yes    Comment: occassionally  . Drug use: No  . Sexual activity: Yes  Lifestyle  . Physical activity:    Days per week: 0 days    Minutes per session: 0 min  . Stress: Not at all  Relationships  . Social  connections:    Talks on phone: More than three times a week    Gets together: More than three times a week    Attends religious service: More than 4 times per year    Active member of club or organization: Yes    Attends meetings of clubs or organizations: More than 4 times per year    Relationship status: Married  Other Topics Concern  . Not on file  Social History Narrative   Regular Exercise -  YES         Allergies  Allergen Reactions  . Lisinopril Other (See Comments)    Edema in face   Family History  Problem Relation Age of Onset  . Brain cancer Mother   . Heart disease Father   . Heart disease Brother     Current Outpatient Medications (Endocrine & Metabolic):  .  levothyroxine (SYNTHROID, LEVOTHROID) 125 MCG tablet, TAKE 1 TABLET BY MOUTH EVERY DAY  Current Outpatient Medications (Cardiovascular):  .  amLODipine (NORVASC) 10 MG tablet, Take 1 tablet (10 mg total) by mouth daily. Marland Kitchen  atorvastatin (LIPITOR) 40 MG tablet, TAKE 1 TABLET (40 MG TOTAL) BY MOUTH DAILY. .  hydrochlorothiazide (HYDRODIURIL) 12.5 MG tablet, Take 1 tablet (12.5 mg total) by mouth daily. Marland Kitchen  losartan (COZAAR) 100 MG tablet, Take 1 tablet (100 mg total) by mouth daily.  Current Outpatient Medications (Respiratory):  .  fluticasone (FLONASE) 50 MCG/ACT nasal spray, Place 2 sprays into both nostrils daily. Marland Kitchen  levocetirizine (XYZAL) 5 MG tablet, Take 1 tablet (5 mg total) by mouth every evening. .  mometasone-formoterol (DULERA) 100-5 MCG/ACT AERO, Inhale 2 puffs into the lungs 2 (two) times daily.  Current Outpatient Medications (Analgesics):  .  aspirin 81 MG EC tablet, Take 81 mg by mouth daily.   Marland Kitchen  HYDROcodone-acetaminophen (NORCO) 10-325 MG tablet, Take 1 tablet by mouth every 8 (eight) hours as needed.   Current Outpatient Medications (Other):  .  cromolyn (OPTICROM) 4 % ophthalmic solution,  .  Multiple Vitamin (MULTIVITAMIN) tablet, Take 1 tablet by mouth daily. Marland Kitchen  omeprazole  (PRILOSEC) 20 MG capsule, TAKE 1 CAPSULE (20 MG TOTAL) BY MOUTH DAILY. Marland Kitchen  oxybutynin (DITROPAN) 5 MG tablet, Take 1 tablet (5 mg total) by mouth 2 (two) times daily. .  tamsulosin (FLOMAX) 0.4 MG CAPS capsule, TAKE 1 CAPSULE BY MOUTH EVERY DAY AFTER DINNER .  thiamine (VITAMIN B-1) 50 MG tablet, Take 1 tablet (50 mg total) by mouth daily.    Past medical history, social, surgical and family history all reviewed in electronic medical record.  No pertanent information unless stated regarding to the chief complaint.   Review of Systems:  No headache, visual changes, nausea, vomiting, diarrhea, constipation, dizziness, abdominal pain, skin rash, fevers, chills, night sweats, weight loss, swollen lymph nodes, body aches, joint swelling, muscle aches, chest pain, shortness of breath, mood changes.   Objective  There were no vitals taken for this visit.    General: No apparent distress alert and oriented x3 mood and affect normal, dressed appropriately.  Shoulder: Inspection reveals no abnormalities, atrophy or asymmetry. Palpation is normal with no tenderness over AC joint or bicipital groove. ROM is full in all planes. Rotator cuff strength normal throughout. No signs of impingement with negative Neer and Hawkin's tests, empty can sign. Speeds and Yergason's tests normal. No labral pathology noted with negative Obrien's, negative clunk and good stability. Normal scapular function observed. No painful arc and no drop arm sign. No apprehension sign    Procedure: Real-time Ultrasound Guided Injection of right glenohumeral joint Device: GE Logiq Q7  Ultrasound guided injection is preferred based studies that show increased duration, increased effect, greater accuracy, decreased procedural pain, increased response rate with ultrasound guided versus blind injection.  Verbal informed consent obtained.  Time-out conducted.  Noted no overlying erythema, induration, or other signs of local  infection.  Skin prepped in a sterile fashion.  Local anesthesia: Topical Ethyl chloride.  With sterile technique and under real time ultrasound guidance:  Joint visualized.  23g 1  inch needle inserted posterior approach. Pictures taken for needle placement. Patient did have injection of 2 cc of 1% lidocaine, 2 cc of 0.5% Marcaine, and 1.0 cc of Kenalog 40 mg/dL. Completed without difficulty  Pain immediately resolved suggesting accurate placement of the medication.  Advised to call if fevers/chills, erythema, induration, drainage, or persistent bleeding.  Images permanently stored and available for review in the ultrasound unit.  Impression: Technically successful ultrasound guided injection.    Impression and Recommendations:     This case required medical decision making of moderate complexity. The above documentation has been reviewed and is accurate and complete Lyndal Pulley, DO       Note: This dictation was prepared with Dragon dictation  along with smaller phrase technology. Any transcriptional errors that result from this process are unintentional.

## 2018-12-21 ENCOUNTER — Ambulatory Visit: Payer: MEDICARE | Admitting: Family Medicine

## 2018-12-21 DIAGNOSIS — Z0289 Encounter for other administrative examinations: Secondary | ICD-10-CM

## 2019-01-17 ENCOUNTER — Other Ambulatory Visit: Payer: Self-pay | Admitting: Internal Medicine

## 2019-01-17 DIAGNOSIS — E032 Hypothyroidism due to medicaments and other exogenous substances: Secondary | ICD-10-CM

## 2019-01-17 DIAGNOSIS — I10 Essential (primary) hypertension: Secondary | ICD-10-CM

## 2019-01-17 DIAGNOSIS — I119 Hypertensive heart disease without heart failure: Secondary | ICD-10-CM

## 2019-01-17 MED ORDER — HYDROCHLOROTHIAZIDE 12.5 MG PO TABS: 12.5000 mg | ORAL_TABLET | Freq: Every day | ORAL | 0 refills | Status: DC

## 2019-02-04 ENCOUNTER — Encounter: Payer: Self-pay | Admitting: Internal Medicine

## 2019-02-04 ENCOUNTER — Other Ambulatory Visit (INDEPENDENT_AMBULATORY_CARE_PROVIDER_SITE_OTHER): Payer: Medicare Other

## 2019-02-04 ENCOUNTER — Other Ambulatory Visit: Payer: Self-pay

## 2019-02-04 ENCOUNTER — Encounter (HOSPITAL_COMMUNITY): Payer: Self-pay | Admitting: Emergency Medicine

## 2019-02-04 ENCOUNTER — Emergency Department (HOSPITAL_COMMUNITY): Payer: Medicare Other

## 2019-02-04 ENCOUNTER — Telehealth: Payer: Self-pay

## 2019-02-04 ENCOUNTER — Emergency Department (HOSPITAL_COMMUNITY)
Admission: EM | Admit: 2019-02-04 | Discharge: 2019-02-04 | Disposition: A | Discharge status: Home / Self Care | Payer: Medicare Other | Attending: Emergency Medicine | Admitting: Emergency Medicine

## 2019-02-04 ENCOUNTER — Ambulatory Visit (INDEPENDENT_AMBULATORY_CARE_PROVIDER_SITE_OTHER): Payer: Medicare Other | Admitting: Internal Medicine

## 2019-02-04 VITALS — BP 170/80 | HR 52 | Temp 98.5°F | Ht 70.0 in | Wt 221.8 lb

## 2019-02-04 DIAGNOSIS — I517 Cardiomegaly: Secondary | ICD-10-CM

## 2019-02-04 DIAGNOSIS — K2100 Gastro-esophageal reflux disease with esophagitis, without bleeding: Secondary | ICD-10-CM

## 2019-02-04 DIAGNOSIS — R001 Bradycardia, unspecified: Secondary | ICD-10-CM | POA: Diagnosis not present

## 2019-02-04 DIAGNOSIS — R0602 Shortness of breath: Secondary | ICD-10-CM

## 2019-02-04 DIAGNOSIS — I10 Essential (primary) hypertension: Secondary | ICD-10-CM | POA: Insufficient documentation

## 2019-02-04 DIAGNOSIS — Z7982 Long term (current) use of aspirin: Secondary | ICD-10-CM | POA: Diagnosis not present

## 2019-02-04 DIAGNOSIS — Z79899 Other long term (current) drug therapy: Secondary | ICD-10-CM | POA: Diagnosis not present

## 2019-02-04 DIAGNOSIS — H0102A Squamous blepharitis right eye, upper and lower eyelids: Secondary | ICD-10-CM | POA: Diagnosis not present

## 2019-02-04 DIAGNOSIS — R0609 Other forms of dyspnea: Secondary | ICD-10-CM | POA: Diagnosis not present

## 2019-02-04 DIAGNOSIS — H11153 Pinguecula, bilateral: Secondary | ICD-10-CM | POA: Diagnosis not present

## 2019-02-04 DIAGNOSIS — E039 Hypothyroidism, unspecified: Secondary | ICD-10-CM | POA: Diagnosis not present

## 2019-02-04 DIAGNOSIS — H10413 Chronic giant papillary conjunctivitis, bilateral: Secondary | ICD-10-CM | POA: Diagnosis not present

## 2019-02-04 DIAGNOSIS — R0789 Other chest pain: Secondary | ICD-10-CM | POA: Diagnosis not present

## 2019-02-04 DIAGNOSIS — R079 Chest pain, unspecified: Secondary | ICD-10-CM | POA: Diagnosis not present

## 2019-02-04 DIAGNOSIS — J45909 Unspecified asthma, uncomplicated: Secondary | ICD-10-CM | POA: Diagnosis not present

## 2019-02-04 DIAGNOSIS — Z96652 Presence of left artificial knee joint: Secondary | ICD-10-CM | POA: Insufficient documentation

## 2019-02-04 DIAGNOSIS — H0102B Squamous blepharitis left eye, upper and lower eyelids: Secondary | ICD-10-CM | POA: Diagnosis not present

## 2019-02-04 LAB — CBC WITH DIFFERENTIAL/PLATELET
Basophils Absolute: 0 10*3/uL (ref 0.0–0.1)
Basophils Relative: 0.2 % (ref 0.0–3.0)
EOS PCT: 6.2 % — AB (ref 0.0–5.0)
Eosinophils Absolute: 0.5 10*3/uL (ref 0.0–0.7)
HCT: 39.2 % (ref 39.0–52.0)
HEMOGLOBIN: 13.2 g/dL (ref 13.0–17.0)
Lymphocytes Relative: 18.7 % (ref 12.0–46.0)
Lymphs Abs: 1.5 10*3/uL (ref 0.7–4.0)
MCHC: 33.7 g/dL (ref 30.0–36.0)
MCV: 90.2 fl (ref 78.0–100.0)
Monocytes Absolute: 0.7 10*3/uL (ref 0.1–1.0)
Monocytes Relative: 8.2 % (ref 3.0–12.0)
Neutro Abs: 5.4 10*3/uL (ref 1.4–7.7)
Neutrophils Relative %: 66.7 % (ref 43.0–77.0)
Platelets: 264 10*3/uL (ref 150.0–400.0)
RBC: 4.35 Mil/uL (ref 4.22–5.81)
RDW: 14.4 % (ref 11.5–15.5)
WBC: 8.1 10*3/uL (ref 4.0–10.5)

## 2019-02-04 LAB — CBC
HCT: 40 % (ref 39.0–52.0)
Hemoglobin: 12.5 g/dL — ABNORMAL LOW (ref 13.0–17.0)
MCH: 28.9 pg (ref 26.0–34.0)
MCHC: 31.3 g/dL (ref 30.0–36.0)
MCV: 92.6 fL (ref 80.0–100.0)
Platelets: 286 10*3/uL (ref 150–400)
RBC: 4.32 MIL/uL (ref 4.22–5.81)
RDW: 13.8 % (ref 11.5–15.5)
WBC: 8.5 10*3/uL (ref 4.0–10.5)
nRBC: 0 % (ref 0.0–0.2)

## 2019-02-04 LAB — BASIC METABOLIC PANEL
Anion gap: 9 (ref 5–15)
BUN: 17 mg/dL (ref 6–23)
BUN: 15 mg/dL (ref 8–23)
CO2: 28 mEq/L (ref 19–32)
CO2: 26 mmol/L (ref 22–32)
Calcium: 10 mg/dL (ref 8.4–10.5)
Calcium: 9.7 mg/dL (ref 8.9–10.3)
Chloride: 101 mEq/L (ref 96–112)
Chloride: 102 mmol/L (ref 98–111)
Creatinine, Ser: 1.46 mg/dL (ref 0.40–1.50)
Creatinine, Ser: 1.6 mg/dL — ABNORMAL HIGH (ref 0.61–1.24)
GFR calc Af Amer: 49 mL/min — ABNORMAL LOW (ref >60)
GFR calc non Af Amer: 42 mL/min — ABNORMAL LOW (ref >60)
GFR: 57.24 mL/min — ABNORMAL LOW (ref >60.00)
Glucose, Bld: 98 mg/dL (ref 70–99)
Glucose, Bld: 114 mg/dL — ABNORMAL HIGH (ref 70–99)
Potassium: 3.4 mEq/L — ABNORMAL LOW (ref 3.5–5.1)
Potassium: 3.2 mmol/L — ABNORMAL LOW (ref 3.5–5.1)
SODIUM: 137 mmol/L (ref 135–145)
Sodium: 139 mEq/L (ref 135–145)

## 2019-02-04 LAB — TROPONIN I
TNIDX: 0.14 ug/l (ref 0.00–0.06)
TROPONIN I: 0.13 ng/mL — AB (ref <0.03)

## 2019-02-04 LAB — I-STAT TROPONIN, ED: Troponin i, poc: 0.05 ng/mL (ref 0.00–0.08)

## 2019-02-04 MED ORDER — SODIUM CHLORIDE 0.9% FLUSH
3.0000 mL | Freq: Once | INTRAVENOUS | Status: AC
  Administered 2019-02-04: 3 mL via INTRAVENOUS

## 2019-02-04 MED ORDER — POTASSIUM CHLORIDE CRYS ER 20 MEQ PO TBCR
40.0000 meq | EXTENDED_RELEASE_TABLET | Freq: Once | ORAL | Status: AC
  Administered 2019-02-04: 40 meq via ORAL
  Filled 2019-02-04: qty 2

## 2019-02-04 MED ORDER — DEXLANSOPRAZOLE 60 MG PO CPDR: 60.0000 mg | DELAYED_RELEASE_CAPSULE | Freq: Every day | ORAL | 1 refills | Status: DC

## 2019-02-04 MED FILL — PREDNISOLONE AC 1% EYE DROP: 1 | 10 days supply | Qty: 10 | Fill #0

## 2019-02-04 NOTE — ED Triage Notes (Signed)
Pt arrives to ED from PCP for eval of chest pain with positive troponin. He has had chest pain for 1 week. Pain is to the left breast, denies any associated symptoms.

## 2019-02-04 NOTE — Patient Instructions (Signed)

## 2019-02-04 NOTE — ED Provider Notes (Signed)
[facility] EMERGENCY DEPARTMENT Provider Note   CSN: [id number] Arrival date & time: 02/04/19  1324    History   Chief Complaint Chief Complaint  Patient presents with  . Chest Pain    HPI Ronald Bond is a 73 y.o. male.     The history is provided by the patient.  Chest Pain  Pain location:  Substernal area Pain quality: dull and pressure   Pain radiates to:  Does not radiate Pain severity:  Moderate Onset quality:  Gradual Duration:  1 week Timing:  Rare Progression:  Resolved Chronicity:  New Context comment:  Pt with exertional chest pain about 1 week ago, saw PCP and had positive troponin and sent for evaluation. Patient with multiple cardiac risk factors, no current chest pain.  Relieved by:  Rest Worsened by:  Nothing Associated symptoms: no abdominal pain, no anorexia, no anxiety, no back pain, no claudication, no cough, no dizziness, no dysphagia, no fatigue, no fever, no lower extremity edema, no nausea, no numbness, no orthopnea, no palpitations, no shortness of breath and no vomiting   Risk factors: high cholesterol, hypertension, male sex and prior DVT/PE (in 74s)   Risk factors: no coronary artery disease and no diabetes mellitus     Past Medical History:  Diagnosis Date  . GERD (gastroesophageal reflux disease)   . HTN (hypertension)   . Hyperlipidemia   . Hypothyroidism   . Osteoarthritis   . Wears glasses     Patient Active Problem List   Diagnosis Date Noted  . Long-term current use of opiate analgesic 05/14/2018  . Bilateral epiphora 11/16/2017  . Thiamine deficiency 06/08/2017  . Obesity (BMI 30.0-34.9) 06/04/2017  . Prediabetes 06/01/2017  . Hypertensive heart disease without CHF (congestive heart failure) 12/25/2016  . Frequent PVCs 09/27/2016  . Right cervical radiculopathy 06/16/2016  . Iron deficiency anemia 03/08/2016  . Spinal stenosis in cervical region 08/25/2015  . Allergic eczema 08/07/2014  .  Obstructive sleep apnea 11/12/2013  . Mild intermittent asthma 06/03/2013  . Routine general medical examination at a health care facility 04/02/2013  . Herniated lumbar disc without myelopathy 10/18/2012  . Allergic rhinitis 03/10/2011  . Cardiomegaly 10/18/2010  . ERECTILE DYSFUNCTION, NON-ORGANIC, MILD 06/29/2009  . Hypothyroidism 05/28/2009  . Hyperlipidemia with target LDL less than 100 05/28/2009  . Essential hypertension, benign 05/28/2009  . GERD 05/28/2009  . BPH associated with nocturia 05/28/2009  . Primary osteoarthritis of right knee 05/28/2009    Past Surgical History:  Procedure Laterality Date  . COLONOSCOPY    . EYE SURGERY     both cataracts  . MICROLARYNGOSCOPY Left 11/18/2013   Procedure: MICROLARYNGOSCOPY WITH REMOVAL OF GRANULOMA LEFT SIDE;  Surgeon: Izora Gala, MD;  Location: [facility];  Service: ENT;  Laterality: Left;  . THYROIDECTOMY  1974  . TONSILLECTOMY    . TOTAL KNEE ARTHROPLASTY  2009   left        Home Medications    Prior to Admission medications   Medication Sig Start Date End Date Taking? Authorizing Provider  amLODipine (NORVASC) 10 MG tablet Take 1 tablet (10 mg total) by mouth daily. 09/15/18  Yes Janith Lima, MD  aspirin 81 MG EC tablet Take 81 mg by mouth daily.     Yes [provider]  atorvastatin (LIPITOR) 40 MG tablet TAKE 1 TABLET (40 MG TOTAL) BY MOUTH DAILY. 08/22/18  Yes Janith Lima, MD  dexlansoprazole (DEXILANT) 60 MG capsule Take 1 capsule (  60 mg total) by mouth daily. 02/04/19  Yes Janith Lima, MD  fluticasone (FLONASE) 50 MCG/ACT nasal spray Place 2 sprays into both nostrils daily. 10/15/18  Yes Janith Lima, MD  hydrochlorothiazide (HYDRODIURIL) 12.5 MG tablet Take 1 tablet (12.5 mg total) by mouth daily. 01/17/19  Yes Janith Lima, MD  hydroxypropyl methylcellulose / hypromellose (ISOPTO TEARS / GONIOVISC) 2.5 % ophthalmic solution Place 1 drop into both eyes 3 (three) times daily.    Yes [provider]  levothyroxine (SYNTHROID, LEVOTHROID) 125 MCG tablet TAKE 1 TABLET BY MOUTH  DAILY Patient taking differently: Take 125 mcg by mouth daily.  01/17/19  Yes Janith Lima, MD  losartan (COZAAR) 100 MG tablet Take 1 tablet (100 mg total) by mouth daily. 10/30/18  Yes Janith Lima, MD  mometasone-formoterol (DULERA) 100-5 MCG/ACT AERO Inhale 2 puffs into the lungs 2 (two) times daily. 03/08/16  Yes Janith Lima, MD  Multiple Vitamin (MULTIVITAMIN) tablet Take 1 tablet by mouth daily.   Yes [provider]  oxybutynin (DITROPAN) 5 MG tablet Take 1 tablet (5 mg total) by mouth 2 (two) times daily. 10/15/18  Yes Janith Lima, MD  tamsulosin (FLOMAX) 0.4 MG CAPS capsule TAKE 1 CAPSULE BY MOUTH EVERY DAY AFTER DINNER Patient taking differently: Take 0.4 mg by mouth daily after supper.  09/25/18  Yes Janith Lima, MD  levocetirizine (XYZAL) 5 MG tablet Take 1 tablet (5 mg total) by mouth every evening. Patient not taking: Reported on 02/04/2019 12/07/18   Janith Lima, MD  prednisoLONE acetate (PRED FORTE) 1 % ophthalmic suspension Place 1 drop into both eyes 3 (three) times daily. 02/04/19   [provider]    Family History Family History  Problem Relation Age of Onset  . Brain cancer Mother   . Heart disease Father   . Heart disease Brother     Social History Social History   Tobacco Use  . Smoking status: Never Smoker  . Smokeless tobacco: Never Used  Substance Use Topics  . Alcohol use: Yes    Comment: occassionally  . Drug use: No     Allergies   Lisinopril   Review of Systems Review of Systems  Constitutional: Negative for chills, fatigue and fever.  HENT: Negative for ear pain, sore throat and trouble swallowing.   Eyes: Negative for pain and visual disturbance.  Respiratory: Negative for cough and shortness of breath.   Cardiovascular: Positive for chest pain. Negative for palpitations, orthopnea and claudication.    Gastrointestinal: Negative for abdominal pain, anorexia, nausea and vomiting.  Genitourinary: Negative for dysuria and hematuria.  Musculoskeletal: Negative for arthralgias and back pain.  Skin: Negative for color change and rash.  Neurological: Negative for dizziness, seizures, syncope and numbness.  All other systems reviewed and are negative.    Physical Exam Updated Vital Signs  ED Triage Vitals  Enc Vitals Group     BP 02/04/19 1331 (!) 162/77     Pulse Rate 02/04/19 1331 (!) 56     Resp 02/04/19 1331 17     Temp 02/04/19 1331 97.6 F (36.4 C)     Temp Source 02/04/19 1331 Oral     SpO2 02/04/19 1331 99 %     Weight 02/04/19 1331 222 lb (100.7 kg)     Height 02/04/19 1331 5\' 10"  (1.778 m)     Head Circumference --      Peak Flow --      Pain Score  02/04/19 1356 1     Pain Loc --      Pain Edu? --      Excl. in [place]? --     Physical Exam Vitals signs and nursing note reviewed.  Constitutional:      Appearance: He is well-developed.  HENT:     Head: Normocephalic and atraumatic.  Eyes:     Conjunctiva/sclera: Conjunctivae normal.     Pupils: Pupils are equal, round, and reactive to light.  Neck:     Musculoskeletal: Normal range of motion and neck supple.  Cardiovascular:     Rate and Rhythm: Normal rate and regular rhythm.     Pulses:          Radial pulses are 2+ on the right side and 2+ on the left side.     Heart sounds: Normal heart sounds. No murmur.  Pulmonary:     Effort: Pulmonary effort is normal. No respiratory distress.     Breath sounds: Normal breath sounds. No decreased breath sounds, wheezing, rhonchi or rales.  Abdominal:     Palpations: Abdomen is soft.     Tenderness: There is no abdominal tenderness.  Musculoskeletal:     Right lower leg: No edema.  Skin:    General: Skin is warm and dry.     Capillary Refill: Capillary refill takes less than 2 seconds.  Neurological:     General: No focal deficit present.     Mental Status: He is  alert.  Psychiatric:        Mood and Affect: Mood normal.      ED Treatments / Results  Labs (all labs ordered are listed, but only abnormal results are displayed) Labs Reviewed  CBC - Abnormal; Notable for the following components:      Result Value   Hemoglobin 12.5 (*)    All other components within normal limits  BASIC METABOLIC PANEL - Abnormal; Notable for the following components:   Potassium 3.2 (*)    Glucose, Bld 114 (*)    Creatinine, Ser 1.60 (*)    GFR calc non Af Amer 42 (*)    GFR calc Af Amer 49 (*)    All other components within normal limits  TROPONIN I - Abnormal; Notable for the following components:   Troponin I 0.13 (*)    All other components within normal limits  I-STAT TROPONIN, ED    EKG EKG Interpretation  Date/Time:  Monday February 04 2019 13:33:10 EDT Ventricular Rate:  59 PR Interval:  168 QRS Duration: 100 QT Interval:  432 QTC Calculation: 427 R Axis:   -55 Text Interpretation:  Sinus bradycardia with sinus arrhythmia Left anterior fascicular block Possible Anterior infarct , age undetermined Abnormal ECG Confirmed by Lennice Sites [phone]) on 02/04/2019 3:20:22 PM   Radiology Dg Chest 2 View  Result Date: 02/04/2019 CLINICAL DATA:  73 y/o M; belching, chest pain, right arm pain for 1 week. EXAM: CHEST - 2 VIEW COMPARISON:  10/21/2013 chest radiograph. FINDINGS: Stable heart size and mediastinal contours are within normal limits. Both lungs are clear. The visualized skeletal structures are unremarkable. IMPRESSION: No active cardiopulmonary disease. Electronically Signed   By: Kristine Garbe M.D.   On: 02/04/2019 14:27    Procedures Procedures (including critical care time)  Medications Ordered in ED Medications  sodium chloride flush (NS) 0.9 % injection 3 mL (3 mLs Intravenous Given 02/04/19 1604)  potassium chloride SA (K-DUR,KLOR-CON) CR tablet 40 mEq (40 mEq Oral Given 02/04/19 1602)  Initial Impression / Assessment and  Plan / ED Course  I have reviewed the triage vital signs and the nursing notes.  Pertinent labs & imaging results that were available during my care of the patient were reviewed by me and considered in my medical decision making (see chart for details).     Ronald Bond is a 73 year old male with history of hypertension, high cholesterol who presents to the ED after episode of chest pain last week.  Patient with overall unremarkable vitals.  Patient saw primary care doctor today who ordered outpatient labs and was sent for evaluation when he had a mildly positive troponin.  Patient states that he had an episode of chest pain about a week ago that appeared to be mildly exertional.  Symptoms resolved last week and he has not had any chest pain, shortness of breath or other issues since then.  Patient has EKG that shows sinus rhythm.  No new ischemic changes.  I-STAT troponin here within normal limits.  Patient overall asymptomatic.  Normal exam.  No hypoxia, no shortness of breath, no risk factors for PE and doubt PE.  Patient with no signs of volume overload on exam.  No significant anemia, electrolyte abnormality, kidney injury, leukocytosis.  Chest x-ray showed no signs of pneumonia, pneumothorax, pleural effusion.  Repeat troponin via blood work is stable at 0.13.  Troponin earlier in the day was 0.14.  Contacted cardiology who states that patient may have had cardiac event last week but at this time as he is asymptomatic and lab work is overall unremarkable he can follow-up outpatient.  Patient prefers this plan as well and I believe that is reasonable given his history, physical, cardiology recommendations.  He was given very strict return precautions.  Discharged from  the ED in good condition.  This chart was dictated using voice recognition software.  Despite best efforts to proofread,  errors can occur which can change the documentation meaning.    Final Clinical Impressions(s) / ED Diagnoses    Final diagnoses:  Chest pain, unspecified type    ED Discharge Orders    None       Lennice Sites, DO 02/04/19 1952

## 2019-02-04 NOTE — Telephone Encounter (Signed)
Lab called and gave critical lab value for pt troponin.  Spoke to PCP and gave elevated result. Per PCP, was contacted and instructed to go to [facility] for further evaluation.

## 2019-02-04 NOTE — Progress Notes (Signed)
Subjective:  Patient ID: Ronald Bond, male    DOB: 05/14/1946  Age: 73 y.o. MRN: [id number]  CC: Chest Pain   HPI Ronald Bond presents for the complaint of an episode of chest pain that occurred a week ago.  He was at work, walking faster than usual when he developed a squeezing sensation in his chest with shortness of breath.  He denies diaphoresis, dizziness, lightheadedness, near syncope, or weakness.   Outpatient Medications Prior to Visit  Medication Sig Dispense Refill  . amLODipine (NORVASC) 10 MG tablet Take 1 tablet (10 mg total) by mouth daily. 90 tablet 1  . aspirin 81 MG EC tablet Take 81 mg by mouth daily.      Marland Kitchen atorvastatin (LIPITOR) 40 MG tablet TAKE 1 TABLET (40 MG TOTAL) BY MOUTH DAILY. 90 tablet 1  . cromolyn (OPTICROM) 4 % ophthalmic solution     . fluticasone (FLONASE) 50 MCG/ACT nasal spray Place 2 sprays into both nostrils daily. 48 g 1  . hydrochlorothiazide (HYDRODIURIL) 12.5 MG tablet Take 1 tablet (12.5 mg total) by mouth daily. 90 tablet 0  . HYDROcodone-acetaminophen (NORCO) 10-325 MG tablet Take 1 tablet by mouth every 8 (eight) hours as needed. 90 tablet 0  . levocetirizine (XYZAL) 5 MG tablet Take 1 tablet (5 mg total) by mouth every evening. 90 tablet 1  . levothyroxine (SYNTHROID, LEVOTHROID) 125 MCG tablet TAKE 1 TABLET BY MOUTH  DAILY 90 tablet 0  . losartan (COZAAR) 100 MG tablet Take 1 tablet (100 mg total) by mouth daily. 90 tablet 0  . mometasone-formoterol (DULERA) 100-5 MCG/ACT AERO Inhale 2 puffs into the lungs 2 (two) times daily. 13 g 11  . Multiple Vitamin (MULTIVITAMIN) tablet Take 1 tablet by mouth daily.    Marland Kitchen oxybutynin (DITROPAN) 5 MG tablet Take 1 tablet (5 mg total) by mouth 2 (two) times daily. 180 tablet 1  . tamsulosin (FLOMAX) 0.4 MG CAPS capsule TAKE 1 CAPSULE BY MOUTH EVERY DAY AFTER DINNER 90 capsule 1  . thiamine (VITAMIN B-1) 50 MG tablet Take 1 tablet (50 mg total) by mouth daily. 90 tablet 1  . omeprazole (PRILOSEC)  20 MG capsule TAKE 1 CAPSULE (20 MG TOTAL) BY MOUTH DAILY. 30 capsule 0   No facility-administered medications prior to visit.     ROS Review of Systems  Constitutional: Negative for diaphoresis and fatigue.  HENT: Negative.   Eyes: Negative.   Respiratory: Positive for shortness of breath. Negative for choking, chest tightness and wheezing.   Cardiovascular: Positive for chest pain. Negative for palpitations and leg swelling.  Gastrointestinal: Negative for abdominal pain, diarrhea, nausea and vomiting.  Endocrine: Negative.   Genitourinary: Negative.   Musculoskeletal: Negative for back pain and neck pain.  Skin: Negative.  Negative for color change.  Neurological: Negative for dizziness, weakness and light-headedness.  Hematological: Negative for adenopathy. Does not bruise/bleed easily.  Psychiatric/Behavioral: Negative.     Objective:  BP (!) 170/80 (BP Location: Left Arm, Patient Position: Sitting, Cuff Size: Large)   Pulse (!) 52   Temp 98.5 F (36.9 C) (Oral)   Ht 5\' 10"  (1.778 m)   Wt 221 lb 12 oz (100.6 kg)   SpO2 98%   BMI 31.82 kg/m   BP Readings from Last 3 Encounters:  02/04/19 (!) 170/80  12/07/18 (!) 142/78  10/15/18 (!) 170/70    Wt Readings from Last 3 Encounters:  02/04/19 221 lb 12 oz (100.6 kg)  12/07/18 221 lb (100.2 kg)  10/15/18 224 lb (101.6 kg)    Physical Exam Vitals signs reviewed.  Constitutional:      General: He is not in acute distress.    Appearance: He is obese. He is not ill-appearing, toxic-appearing or diaphoretic.  HENT:     Nose: Nose normal.     Mouth/Throat:     Mouth: Mucous membranes are moist.     Pharynx: Oropharynx is clear. No oropharyngeal exudate.  Eyes:     General: No scleral icterus.    Conjunctiva/sclera: Conjunctivae normal.  Neck:     Musculoskeletal: Normal range of motion and neck supple. No muscular tenderness.  Cardiovascular:     Rate and Rhythm: Normal rate and regular rhythm.     Heart sounds:  No murmur. No friction rub. No gallop.      Comments: EKG ----  Sinus  Bradycardia  - occasional PAC    # PACs = 1. WITHIN NORMAL LIMITS  Pulmonary:     Effort: Pulmonary effort is normal.     Breath sounds: No stridor. No wheezing, rhonchi or rales.  Abdominal:     Palpations: There is no hepatomegaly, splenomegaly or mass.     Tenderness: There is no abdominal tenderness. There is no guarding.  Musculoskeletal: Normal range of motion.        General: No swelling.     Right lower leg: No edema.     Left lower leg: No edema.  Skin:    General: Skin is warm and dry.  Neurological:     General: No focal deficit present.     Mental Status: He is oriented to person, place, and time. Mental status is at baseline.     Lab Results  Component Value Date   WBC 8.5 10/15/2018   HGB 13.3 10/15/2018   HCT 39.6 10/15/2018   PLT 265.0 10/15/2018   GLUCOSE 107 (H) 10/15/2018   CHOL 160 10/15/2018   TRIG 106.0 10/15/2018   HDL 61.20 10/15/2018   LDLDIRECT 75.0 03/08/2016   LDLCALC 77 10/15/2018   ALT 20 10/15/2018   AST 19 10/15/2018   NA 141 10/15/2018   K 3.7 10/15/2018   CL 103 10/15/2018   CREATININE 1.32 10/15/2018   BUN 13 10/15/2018   CO2 29 10/15/2018   TSH 2.88 10/15/2018   PSA 2.34 10/15/2018   INR 0.9 10/17/2007   HGBA1C 5.9 10/15/2018    Mr Shoulder Right W Contrast  Result Date: 10/29/2018 CLINICAL DATA:  Chronic right shoulder pain for the past month. EXAM: MR ARTHROGRAM OF THE RIGHT SHOULDER TECHNIQUE: Multiplanar, multisequence MR imaging of the right shoulder was performed following the administration of intra-articular contrast. CONTRAST:  See Injection Documentation. COMPARISON:  Right shoulder x-rays dated April 30, 2018. FINDINGS: Rotator cuff: Full-thickness, near full width tear of the supraspinatus tendon with 1.7 cm retraction. Some posterior fibers remain intact. The infraspinatus, subscapularis, and teres minor tendons are unremarkable. Muscles:  No focal  muscular atrophy or edema. Biceps long head:  Intact and normally positioned. Acromioclavicular Joint: The acromion is type II. There are mild acromioclavicular degenerative changes. Contrast extends into the acromioclavicular joint, consistent with disruption of the inferior acromioclavicular ligament. Large amount of contrast within the subacromial/subdeltoid bursa. Glenohumeral Joint: Distended with intra-articular contrast. Diffuse cartilage thinning without chondral defect. Labrum:  No evidence of labral tear. Bones: No acute or significant extra-articular osseous findings. Other: No significant soft tissue findings. IMPRESSION: 1. Full-thickness, near full width tear of the mid to distal supraspinatus tendon  with 1.7 cm retraction. A few posterior fibers remain intact. No muscle atrophy. 2. Mild acromioclavicular and glenohumeral degenerative changes. 3. No labral tear. Electronically Signed   By: Titus Dubin M.D.   On: 10/29/2018 16:54   Dg Fluoro Guided Needle Plc Aspiration/injection Loc  Result Date: 10/29/2018 CLINICAL DATA:  RIGHT shoulder pain. FLUOROSCOPY TIME:  4 seconds corresponding to a Dose Area Product of 8.9 Gy*m2 PROCEDURE: RIGHT SHOULDER INJECTION UNDER FLUOROSCOPY An appropriate skin entrance site was determined. The site was marked, prepped with Betadine, draped in the usual sterile fashion, and infiltrated locally with 1% lidocaine. 22 gauge spinal needle was advanced to the superomedial margin of the humeral head under intermittent fluoroscopy. 1 ml of Lidocaine injected easily. A mixture of 0.1 ml Multihance and 20 ml of dilute Isovue M 200 was then used to opacify the RIGHT shoulder capsule. No immediate complication. IMPRESSION: Technically successful RIGHT shoulder injection for MRI. Electronically Signed   By: Staci Righter M.D.   On: 10/29/2018 16:54    Assessment & Plan:   Ronald Bond was seen today for chest pain.  Diagnoses and all orders for this  visit:  Cardiomegaly -     Cancel: EXERCISE TOLERANCE TEST (ETT); Future  Essential hypertension, benign -     EKG 12-Lead -     CBC with Differential/Platelet; Future -     Basic metabolic panel; Future  SOB (shortness of breath) -     EKG 12-Lead -     Troponin I -; Future -     D-dimer, quantitative (not at [facility]); Future  DOE (dyspnea on exertion)- His EKG is negative for ischemia but he has a mild elevation in his troponin level so I recommended that he go to the ED for work-up of possible acute coronary syndrome. -     EKG 12-Lead -     Cancel: EXERCISE TOLERANCE TEST (ETT); Future -     Troponin I -; Future -     D-dimer, quantitative (not at [facility]); Future  Other chest pain -     Troponin I -; Future -     D-dimer, quantitative (not at [facility]); Future  Gastroesophageal reflux disease with esophagitis- He tells me the current PPI is not very effective at controlling his symptoms so I have recommended that he upgrade to a more potent PPI. -     dexlansoprazole (DEXILANT) 60 MG capsule; Take 1 capsule (60 mg total) by mouth daily.   I have discontinued Ronald Bond's omeprazole. I am also having him start on dexlansoprazole. Additionally, I am having him maintain his aspirin, mometasone-formoterol, cromolyn, thiamine, multivitamin, HYDROcodone-acetaminophen, atorvastatin, amLODipine, tamsulosin, oxybutynin, fluticasone, losartan, levocetirizine, levothyroxine, and hydrochlorothiazide.  Meds ordered this encounter  Medications  . dexlansoprazole (DEXILANT) 60 MG capsule    Sig: Take 1 capsule (60 mg total) by mouth daily.    Dispense:  90 capsule    Refill:  1     Follow-up: Return in about 4 weeks (around 03/04/2019).  Scarlette Calico, MD

## 2019-02-05 LAB — D-DIMER, QUANTITATIVE: D-Dimer, Quant: 1.97 mcg/mL FEU — ABNORMAL HIGH (ref <0.50)

## 2019-02-08 ENCOUNTER — Other Ambulatory Visit: Payer: Self-pay | Admitting: *Deleted

## 2019-02-08 NOTE — Patient Outreach (Signed)
[place] [facility]) Care Management  02/08/2019  Ronald Bond Jan 23, 1946 [id number]  Telephone screening call   Referral received : 3/11 Referral source: [facility] referral  Referral reason : Patient reports cost of eye drop $70.  Insurance: [place] and chart review noted  history of hypertension, spinal stenosis,  ED visit on 3/9 for complaint of exertional chest pain   Unsuccessful outreach call to patient no answer able to leave a HIPAA compliant message requesting a return call    Plan Will send unsuccessful outreach letter on today, if no return response. Will plan return call in the next 4 business days.   Joylene Draft, RN, [place] Management Coordinator  [phone]- Mobile [phone]- Toll Free Main Office

## 2019-02-13 ENCOUNTER — Other Ambulatory Visit: Payer: Self-pay | Admitting: *Deleted

## 2019-02-13 NOTE — Patient Outreach (Addendum)
[place] [facility]) Care Management  02/13/2019  Ronald Bond 03-Apr-1946 [id number]   Telephone screening call   Referral received : 3/11 Referral source: [facility] referral  Referral reason : Patient reports cost of eye drop $70.  Insurance: [place] and chart review noted  history of hypertension, spinal stenosis,  ED visit on 3/9 for complaint of exertional chest pain Unsuccessful outreach call to patient no answer able to leave a HIPAA compliant message requesting a return call    Patient returned call , explained reason for the call.   Patient discussed that he is doing great. He has been able to fill prescription for eye drop prescribed, by ophthalmologist and everything is doing just great. Patient reports being able to afford medications.   Patient further discussed.   Social  Lives wife, he is independent working out of state returns home every 10 days, but now will begin working from home. Conditions Patient discussed recent ED visit for chest pain , denies chest pain or discomfort since ED visit.  Patient reports having high blood pressure but it is under control, does not monitor it at home but will get him a machine.  Medications  Patient reports taking medications as prescribed, reports being able to afford.  Advanced Directives Patient does not have in place at present, but is working on it , declines needs for additional education or handout.  Appointments Patient has scheduled follow up with cardiology.  Consent  Explained and offered [facility] care management services, patient declined need for services, no care coordination needs indentified.     Plan  Will send successful outreach letter: patient assessed no further interventions identified at this time .  Reinforced attending medical appointments and notifying MD, seeking medical attention for recurrent chest pain, shortness of breath.   Joylene Draft, RN, [place] Management Coordinator  [phone]- Mobile [phone]- Toll Free Main Office

## 2019-02-18 ENCOUNTER — Ambulatory Visit: Payer: Medicare Other | Admitting: *Deleted

## 2019-02-23 ENCOUNTER — Other Ambulatory Visit: Payer: Self-pay | Admitting: Internal Medicine

## 2019-02-23 DIAGNOSIS — E785 Hyperlipidemia, unspecified: Secondary | ICD-10-CM

## 2019-02-27 ENCOUNTER — Telehealth: Payer: Self-pay | Admitting: Physician Assistant

## 2019-02-27 NOTE — Telephone Encounter (Signed)
Patient had exertional chest pain until a week before he was seen in the ED on 3/6, troponin borderline elevated. Felt patient likely had a missed MI, however since no further symptom, he was discharged. I talked with the patient today, he has no further exertional chest pain since a month ago. Will arrange evisit, and potentially set up for echo once COVID-19 situation is under control.

## 2019-02-28 ENCOUNTER — Ambulatory Visit: Payer: Medicare Other | Admitting: Physician Assistant

## 2019-03-13 ENCOUNTER — Other Ambulatory Visit: Payer: Self-pay | Admitting: Internal Medicine

## 2019-03-13 DIAGNOSIS — N401 Enlarged prostate with lower urinary tract symptoms: Secondary | ICD-10-CM

## 2019-03-13 DIAGNOSIS — I119 Hypertensive heart disease without heart failure: Secondary | ICD-10-CM

## 2019-03-13 DIAGNOSIS — R351 Nocturia: Principal | ICD-10-CM

## 2019-03-13 DIAGNOSIS — N410 Acute prostatitis: Secondary | ICD-10-CM

## 2019-03-21 ENCOUNTER — Telehealth: Payer: Self-pay | Admitting: Cardiology

## 2019-03-21 NOTE — Telephone Encounter (Signed)
Smartphone/ my chart/ virtual consent/ pre reg completed °

## 2019-03-22 ENCOUNTER — Telehealth: Payer: Self-pay | Admitting: *Deleted

## 2019-03-22 ENCOUNTER — Telehealth (INDEPENDENT_AMBULATORY_CARE_PROVIDER_SITE_OTHER): Payer: Medicare Other | Admitting: Cardiology

## 2019-03-22 ENCOUNTER — Encounter: Payer: Self-pay | Admitting: Cardiology

## 2019-03-22 VITALS — BP 175/70 | HR 65 | Ht 70.0 in | Wt 220.0 lb

## 2019-03-22 DIAGNOSIS — I10 Essential (primary) hypertension: Secondary | ICD-10-CM | POA: Diagnosis not present

## 2019-03-22 DIAGNOSIS — R778 Other specified abnormalities of plasma proteins: Secondary | ICD-10-CM

## 2019-03-22 DIAGNOSIS — R7989 Other specified abnormal findings of blood chemistry: Secondary | ICD-10-CM

## 2019-03-22 DIAGNOSIS — Z01818 Encounter for other preprocedural examination: Secondary | ICD-10-CM

## 2019-03-22 DIAGNOSIS — I208 Other forms of angina pectoris: Secondary | ICD-10-CM

## 2019-03-22 DIAGNOSIS — I119 Hypertensive heart disease without heart failure: Secondary | ICD-10-CM

## 2019-03-22 DIAGNOSIS — E785 Hyperlipidemia, unspecified: Secondary | ICD-10-CM

## 2019-03-22 MED ORDER — CARVEDILOL 3.125 MG PO TABS: 3.1250 mg | ORAL_TABLET | Freq: Two times a day (BID) | ORAL | 3 refills | Status: DC

## 2019-03-22 NOTE — Assessment & Plan Note (Signed)
Somewhat unusual episode lasting just a short amount of time and then not recurring.  1 week later had elevated troponin levels.  The symptom actually sounded very concerning he was walking down a really platform and had chest squeezing sensation that is very concerning for angina, however the duration of time being so short acting, and not recurring is less atypical.  However, in light of his significant risk factors, and elevated troponin, I think we need to do an ischemic evaluation.  Likely the best option would be coronary CT angiogram in order to get a good assessment of his coronary anatomy as well as any potential physiology with CT FFR.  If he has any progression of symptoms or recurrence of symptoms, would consider cardiac catheterization.

## 2019-03-22 NOTE — Assessment & Plan Note (Signed)
No heart failure symptoms.  Euvolemic by exam visually.  Euvolemic by symptoms. Is already on max dose of his current ARB--we could consider switching to a more potent ARB. He is on max dose amlodipine. With euvolemia, no diuretic requirement at this time, but could consider spironolactone or chlorthalidone.  We are starting moderate dose carvedilol day.

## 2019-03-22 NOTE — Telephone Encounter (Signed)
SPOKE TO PATIENT -- REVIEWED INSTRUCTIONS FORM TELE-VISIT . PATIENT VOICED UNDERSTANDING --  AVS SUMMARY WILL BE SENT VIA MYCHART- [place] WILL BE MAILED

## 2019-03-22 NOTE — Assessment & Plan Note (Signed)
Flat troponin elevation 1 week after having chest discomfort that was very short-lived.  I do not more than that one episode was long enough to

## 2019-03-22 NOTE — Patient Instructions (Addendum)
Medication Instructions:   START -CARVEDILOL 3.125 MG ONE TABLET TWICE A DAY   If you need a refill on your cardiac medications before your next appointment, please call your pharmacy.   Lab work: BMP---SEE INSTRUCTIONS  ON CCTA INSTRUCTION SHEET   If you have labs (blood work) drawn today and your tests are completely normal, you will receive your results only by: Marland Kitchen MyChart Message (if you have MyChart) OR . A paper copy in the mail If you have any lab test that is abnormal or we need to change your treatment, we will call you to review the results.  Testing/Procedures:   Will schedule at  [facility]  Radiology once pre -[place] has requested that you have cardiac/CORONARY CTA. Cardiac computed tomography (CT) is a painless test that uses an x-ray machine to take clear, detailed pictures of your heart. For further information please visit [email or web address]. Please follow instruction sheet as given.   Follow-Up: At [facility], you and your health needs are our priority.  As part of our continuing mission to provide you with exceptional heart care, we have created designated Provider Care Teams.  These Care Teams include your primary Cardiologist (physician) and Advanced Practice Providers (APPs -  Physician Assistants and Nurse Practitioners) who all work together to provide you with the care you need, when you need it. You will need a follow up appointment in            .  Please call our office 2 months in advance to schedule this appointment.  You may see Glenetta Hew , MD or one of the following Advanced Practice Providers on your designated Care Team:   Rosaria Ferries, PA-C . Jory Sims, DNP, ANP  Any Other Special Instructions Will Be Listed Below (If Applicable).     INSTRUCTIONS FOR  CORONARY CTA    Please arrive at the [facility] main entrance of [facility] at (30-45 minutes prior to test start  time)  [facility] [place], [place] [phone]  Proceed to the [facility] Radiology Department (First Floor).  Please follow these instructions carefully (unless otherwise directed):  PLEASE HAVE LABS - BMP  AT LEAST ONE WEEK PRIOR TO TEST  On the Night Before the Test: . Drink plenty of water. . Do not consume any caffeinated/decaffeinated beverages or chocolate 12 hours prior to your test. . Do not take any antihistamines 12 hours prior to your test.    On the Day of the Test: . Drink plenty of water. Do not drink any water within one hour of the test. . Do not eat any food 4 hours prior to the test. . You may take your regular medications prior to the test.   - Take carvedilol 3.125 mg the morning before the test.  DO NOT TAKE HYDROCHLOROTHIAZIDE BEFORE THE TEST YOU MAY TAKE LATER THAT DAY .   After the Test: . Drink plenty of water. . After receiving IV contrast, you may experience a mild flushed feeling. This is normal. . On occasion, you may experience a mild rash up to 24 hours after the test. This is not dangerous. If this occurs, you can take Benadryl 25 mg and increase your fluid intake. . If you experience trouble breathing, this can be serious. If it is severe call 911 IMMEDIATELY. If it is mild, please call our office.

## 2019-03-22 NOTE — Progress Notes (Signed)
Virtual Visit via Video Note   This visit type was conducted due to national recommendations for restrictions regarding the COVID-19 Pandemic (e.g. social distancing) in an effort to limit this patient's exposure and mitigate transmission in our community.  Due to his co-morbid illnesses, this patient is at least at moderate risk for complications without adequate follow up.  This format is felt to be most appropriate for this patient at this time.  All issues noted in this document were discussed and addressed.  A limited physical exam was performed with this format.  Please refer to the patient's chart for his consent to telehealth for [facility].   Patient has given verbal permission to conduct this visit via virtual appointment and to bill insurance 03/21/2019 5:02 PM     Evaluation Performed:  Follow-up visit  Date:  03/22/2019   ID:  Ronald Bond, DOB [birth year], MRN [id number]  Patient Location: Other:  in a Hotel Provider Location: Home  PCP:  Janith Lima, MD  Cardiologist:  Glenetta Hew, MD -Glenetta Hew, MD Electrophysiologist:  None   Chief Complaint: ER follow-up for Ronald pain and mild troponin elevation  History of Present Illness:    Ronald Bond is a 73 y.o. male with PMH notable for hypertension, hyperlipidemia and mild obesity who presents via audio/video conferencing for a telehealth visit today ER follow-up.  Ronald Bond was last seen by me in January 2018 in consultation for palpitations-frequent PVCs and thoracic aortic atherosclerosis seen on CT scan.  At that time I chose not to start a beta-blocker because of borderline heart rate but also recent asthma attack. He has a history of a negative Myoview in 2014 and an essentially negative/normal echocardiogram in 2011 besides grade 2 diastolic dysfunction.  He was evaluated with a two-week monitor which was relatively benign.  Interval History:  Ronald Bond was just seen in the emergency room on  March 9 for Ronald pain.  He had mildly elevated troponin levels following an Bond of Ronald pain that occurred a week prior to evaluation.  He had not had any symptoms since.  EKG was nonischemic, and i-STAT troponin was normal.  Ronald x-ray resulted.  Follow-up troponin was 0.13.  I discussed with on-call cardiology who suggested that may have been an Bond 1 week prior followed up as an outpatient.  Ronald Bond --he describes this as a roughly 1 to 2-minute Bond of a grabbing sensation in his Ronald that had the feeling of constricting a garden hose so that it limited the flow.  He felt a little bit short of breath, but the whole Bond did not last that long and so he really did not think much of it at the time, in fact he did not go to his PCP to discuss the Ronald pain, just came up in review of symptoms during a routine follow-up visit.  He has not had any further episodes like this since that one spell.  He is also had some burning sensation discomfort in his Ronald that friend since last night laying on his belly he started feeling the symptoms severe intense burning sensation that did improve when he rolled over onto his back, this symptom usually has no bearing with rest or exertion.  He otherwise seems to be doing pretty well from a cardiac standpoint.  Despite his blood pressure being quite high denies any headache or blurred vision or dizziness.  Cardiovascular ROS: positive for - Ronald pain and also intermittent  burning sensation in Ronald off & on - i.e. lying on belly last PM (better after rolling over)  negative for - edema, irregular heartbeat, loss of consciousness, orthopnea, palpitations, paroxysmal nocturnal dyspnea, rapid heart rate, shortness of breath or TIA/Amaurosis fugax  The patient does not have symptoms concerning for COVID-19 infection (fever, chills, cough, or new shortness of breath).  The patient is practicing social distancing. -- Back working -- Production assistant, radio patrol   ROS:  Please see the history of present illness.    Review of Systems  HENT: Negative for congestion and nosebleeds.        Post-nasal drip & tickle in throat - Allergies  Respiratory: Positive for cough. Negative for shortness of breath and wheezing.   Gastrointestinal: Positive for heartburn.  Genitourinary: Negative for dysuria and hematuria.  Musculoskeletal: Positive for joint pain (OA pains off & on ).  Endo/Heme/Allergies: Positive for environmental allergies.  Psychiatric/Behavioral: Negative.   All other systems reviewed and are negative.   Past Medical History:  Diagnosis Date  . GERD (gastroesophageal reflux disease)   . HTN (hypertension)   . Hyperlipidemia   . Hypothyroidism   . Osteoarthritis   . Wears glasses    Past Surgical History:  Procedure Laterality Date  . COLONOSCOPY    . EYE SURGERY     both cataracts  . MICROLARYNGOSCOPY Left 11/18/2013   Procedure: MICROLARYNGOSCOPY WITH REMOVAL OF GRANULOMA LEFT SIDE;  Surgeon: Izora Gala, MD;  Location: [facility];  Service: ENT;  Laterality: Left;  . NM MYOVIEW LTD  06/2007   Low risk, normal.  No evidence of ischemia or infarction  . THYROIDECTOMY  1974  . TONSILLECTOMY    . TOTAL KNEE ARTHROPLASTY  2009   left     Current Meds  Medication Sig  . amLODipine (NORVASC) 10 MG tablet TAKE 1 TABLET BY MOUTH  DAILY  . aspirin 81 MG EC tablet Take 81 mg by mouth daily.    Marland Kitchen atorvastatin (LIPITOR) 40 MG tablet TAKE 1 TABLET BY MOUTH EVERY DAY  . dexlansoprazole (DEXILANT) 60 MG capsule Take 1 capsule (60 mg total) by mouth daily.  . fluticasone (FLONASE) 50 MCG/ACT nasal spray Place 2 sprays into both nostrils daily.  . hydrochlorothiazide (HYDRODIURIL) 12.5 MG tablet Take 1 tablet (12.5 mg total) by mouth daily.  . hydroxypropyl methylcellulose / hypromellose (ISOPTO TEARS / GONIOVISC) 2.5 % ophthalmic solution Place 1 drop into both eyes 3 (three) times daily.  Marland Kitchen  levothyroxine (SYNTHROID, LEVOTHROID) 125 MCG tablet TAKE 1 TABLET BY MOUTH  DAILY (Patient taking differently: Take 125 mcg by mouth daily. )  . losartan (COZAAR) 100 MG tablet Take 1 tablet (100 mg total) by mouth daily.  . mometasone-formoterol (DULERA) 100-5 MCG/ACT AERO Inhale 2 puffs into the lungs 2 (two) times daily.  . Multiple Vitamin (MULTIVITAMIN) tablet Take 1 tablet by mouth daily.  Marland Kitchen oxybutynin (DITROPAN) 5 MG tablet Take 1 tablet (5 mg total) by mouth 2 (two) times daily.  . prednisoLONE acetate (PRED FORTE) 1 % ophthalmic suspension Place 1 drop into both eyes 3 (three) times daily.  . tamsulosin (FLOMAX) 0.4 MG CAPS capsule Take 1 capsule (0.4 mg total) by mouth daily after supper.     Allergies:   Lisinopril   Social History   Tobacco Use  . Smoking status: Never Smoker  . Smokeless tobacco: Never Used  Substance Use Topics  . Alcohol use: Yes    Comment: occassionally  . Drug  use: No     Family Hx: The patient's family history includes Brain cancer in his mother; Heart disease in his brother and father.   Prior CV studies:   The following studies were reviewed today: . Myoview August 2014: LOW RISK.  No ischemia or infarction.  Labs/Other Tests and Data Reviewed:    EKG:  An ECG dated February 04, 2019 was personally reviewed today and demonstrated:  Sinus rhythm with sinus arrhythmia.  Nonspecific findings of coronary correction.  LAFB (-55 Deg)  Recent Labs: 10/15/2018: ALT 20; TSH 2.88 02/04/2019: BUN 15; Creatinine, Ser 1.60; Hemoglobin 12.5; Platelets 286; Potassium 3.2; Sodium 137   Recent Lipid Panel Lab Results  Component Value Date   CHOL 160 10/15/2018   HDL 61.20 10/15/2018   LDLCALC 77 10/15/2018   LDLDIRECT 75.0 03/08/2016   TRIG 106.0 10/15/2018   CHOLHDL 3 10/15/2018    Wt Readings from Last 3 Encounters:  03/22/19 220 lb (99.8 kg)  02/04/19 222 lb (100.7 kg)  02/04/19 221 lb 12 oz (100.6 kg)     Objective:    Vital Signs:  BP (!)  175/70   Pulse 65   Ht 5\' 10"  (1.778 m)   Wt 220 lb (99.8 kg)   BMI 31.57 kg/m   VITAL SIGNS:  reviewed Well nourished, well developed male in no acute distress.  Obese, thick neck (unable to assess JVD) A&O x 3.  normal Mood & Affect Non-labored respirations   ASSESSMENT & PLAN:    Problem List Items Addressed This Visit    Atypical angina ([place])    Somewhat unusual Bond lasting just a short amount of time and then not recurring.  1 week later had elevated troponin levels.  The symptom actually sounded very concerning he was walking down a really platform and had Ronald squeezing sensation that is very concerning for angina, however the duration of time being so short acting, and not recurring is less atypical.  However, in light of his significant risk factors, and elevated troponin, I think we need to do an ischemic evaluation.  Likely the best option would be coronary CT angiogram in order to get a good assessment of his coronary anatomy as well as any potential physiology with CT FFR.  If he has any progression of symptoms or recurrence of symptoms, would consider cardiac catheterization.      Relevant Medications   carvedilol (COREG) 3.125 MG tablet   Other Relevant Orders   Basic metabolic panel   CT CORONARY MORPH W/CTA COR W/SCORE W/CA W/CM &/OR WO/CM   CT CORONARY FRACTIONAL FLOW RESERVE DATA PREP   CT CORONARY FRACTIONAL FLOW RESERVE FLUID ANALYSIS   Elevated troponin    Flat troponin elevation 1 week after having Ronald discomfort that was very short-lived.  I do not more than that one Bond was long enough to       Relevant Orders   CT CORONARY MORPH W/CTA COR W/SCORE W/CA W/CM &/OR WO/CM   CT CORONARY FRACTIONAL FLOW RESERVE DATA PREP   CT CORONARY FRACTIONAL FLOW RESERVE FLUID ANALYSIS   Essential hypertension, benign - Primary (Chronic)    Blood pressure is very high today.  Was also very high when he saw his PCP.  Is already on max dose of amlodipine and  losartan.  Heart rate is relatively stable.  We will start carvedilol 6.25 mg twice daily --> especially given concern for possible active CAD.      Relevant Medications   carvedilol (COREG)  3.125 MG tablet   Hyperlipidemia with target LDL less than 100 (Chronic)   Relevant Medications   carvedilol (COREG) 3.125 MG tablet   Hypertensive heart disease without CHF (congestive heart failure) (Chronic)    No heart failure symptoms.  Euvolemic by exam visually.  Euvolemic by symptoms. Is already on max dose of his current ARB--we could consider switching to a more potent ARB. He is on max dose amlodipine. With euvolemia, no diuretic requirement at this time, but could consider spironolactone or chlorthalidone.  We are starting moderate dose carvedilol day.      Relevant Medications   carvedilol (COREG) 3.125 MG tablet   Other Relevant Orders   Basic metabolic panel    Other Visit Diagnoses    Pre-op testing       Relevant Orders   Basic metabolic panel      [id number] Education: The signs and symptoms of COVID-19 were discussed with the patient and how to seek care for testing (follow up with PCP or arrange E-visit).   The importance of social distancing was discussed today.  Time:   Today, I have spent 20  minutes with the patient with telehealth technology discussing the above problems.     Medication Adjustments/Labs and Tests Ordered: Current medicines are reviewed at length with the patient today.  Concerns regarding medicines are outlined above.  Patient instructions: If symptoms recur or are worse (like the Bond during 1 week prior to ER) --would recommend going to get evaluated at the nearest emergency room. Also if you have symptoms of shortness of breath with exertion, shortness running up flat or waking up short of breath in addition to oral other than Ronald pain, please notify us urgently.  Medication Instructions:   Start carvedilol 6.25 mg twice daily (for 6  days take 1/2 tablet twice daily and then increase to full tablet twice a day)    Tests Ordered: Orders Placed This Encounter  Procedures  . CT CORONARY MORPH W/CTA COR W/SCORE W/CA W/CM &/OR WO/CM  . CT CORONARY FRACTIONAL FLOW RESERVE DATA PREP  . CT CORONARY FRACTIONAL FLOW RESERVE FLUID ANALYSIS  . Basic metabolic panel   Coronary CT Angiogram  Medication Changes: Meds ordered this encounter  Medications  . carvedilol (COREG) 3.125 MG tablet    Sig: Take 1 tablet (3.125 mg total) by mouth 2 (two) times daily.    Dispense:  180 tablet    Refill:  3     Disposition:  Follow up ASAP after Cor CTa.       Signed, Glenetta Hew, MD  03/22/2019 5:14 PM    [place]

## 2019-03-22 NOTE — Assessment & Plan Note (Signed)
Blood pressure is very high today.  Was also very high when he saw his PCP.  Is already on max dose of amlodipine and losartan.  Heart rate is relatively stable.  We will start carvedilol 6.25 mg twice daily --> especially given concern for possible active CAD.

## 2019-04-09 ENCOUNTER — Other Ambulatory Visit: Payer: Self-pay | Admitting: Internal Medicine

## 2019-04-09 DIAGNOSIS — I10 Essential (primary) hypertension: Secondary | ICD-10-CM

## 2019-04-09 DIAGNOSIS — I119 Hypertensive heart disease without heart failure: Secondary | ICD-10-CM

## 2019-04-09 DIAGNOSIS — E032 Hypothyroidism due to medicaments and other exogenous substances: Secondary | ICD-10-CM

## 2019-04-15 ENCOUNTER — Other Ambulatory Visit: Payer: Self-pay | Admitting: Internal Medicine

## 2019-04-15 DIAGNOSIS — I10 Essential (primary) hypertension: Secondary | ICD-10-CM

## 2019-04-15 DIAGNOSIS — I119 Hypertensive heart disease without heart failure: Secondary | ICD-10-CM

## 2019-04-15 MED ORDER — LOSARTAN POTASSIUM 100 MG PO TABS: 100.0000 mg | ORAL_TABLET | Freq: Every day | ORAL | 1 refills | Status: DC

## 2019-04-19 ENCOUNTER — Telehealth: Payer: Self-pay | Admitting: Internal Medicine

## 2019-04-19 ENCOUNTER — Other Ambulatory Visit: Payer: Self-pay | Admitting: Internal Medicine

## 2019-04-19 DIAGNOSIS — E785 Hyperlipidemia, unspecified: Secondary | ICD-10-CM

## 2019-04-19 MED ORDER — ATORVASTATIN CALCIUM 40 MG PO TABS: 40.0000 mg | ORAL_TABLET | Freq: Every day | ORAL | 1 refills | Status: DC

## 2019-04-19 MED FILL — ATORVASTATIN 40 MG TABLET: 40 | 90 days supply | Qty: 90 | Fill #0

## 2019-04-19 NOTE — Telephone Encounter (Signed)
Copied from [place] [id number]. Topic: Quick Communication - Rx Refill/Question >> Apr 19, 2019 11:52 AM Robina Ade, Helene Kelp D wrote: Medication:atorvastatin (LIPITOR) 40 MG tablet  Has the patient contacted their pharmacy? Yes, patient is living out of town tonight and would like refill please. (Agent: If no, request that the patient contact the pharmacy for the refill.) (Agent: If yes, when and what did the pharmacy advise?)  Preferred Pharmacy (with phone number or street name): [facility], [place]  Agent: Please be advised that RX refills may take up to 3 business days. We ask that you follow-up with your pharmacy.

## 2019-04-21 ENCOUNTER — Other Ambulatory Visit: Payer: Self-pay | Admitting: Internal Medicine

## 2019-04-21 DIAGNOSIS — I10 Essential (primary) hypertension: Secondary | ICD-10-CM

## 2019-04-21 DIAGNOSIS — I119 Hypertensive heart disease without heart failure: Secondary | ICD-10-CM

## 2019-04-26 MED FILL — OXYBUTYNIN CHLORIDE 5 MG TA: 5 | 90 days supply | Qty: 180 | Fill #1

## 2019-05-15 ENCOUNTER — Telehealth: Payer: Self-pay | Admitting: *Deleted

## 2019-05-15 NOTE — Telephone Encounter (Signed)
Called patient to reminded patient to have BMP prior to CCTA.  Review AVS SUMMARY --Patient verbalized understanding

## 2019-05-17 DIAGNOSIS — I119 Hypertensive heart disease without heart failure: Secondary | ICD-10-CM | POA: Diagnosis not present

## 2019-05-17 DIAGNOSIS — I208 Other forms of angina pectoris: Secondary | ICD-10-CM | POA: Diagnosis not present

## 2019-05-17 DIAGNOSIS — Z01818 Encounter for other preprocedural examination: Secondary | ICD-10-CM | POA: Diagnosis not present

## 2019-05-17 LAB — BASIC METABOLIC PANEL
BUN/Creatinine Ratio: 13 (ref 10–24)
BUN: 20 mg/dL (ref 8–27)
CO2: 23 mmol/L (ref 20–29)
Calcium: 9.4 mg/dL (ref 8.6–10.2)
Chloride: 101 mmol/L (ref 96–106)
Creatinine, Ser: 1.5 mg/dL — ABNORMAL HIGH (ref 0.76–1.27)
GFR calc Af Amer: 53 mL/min/{1.73_m2} — ABNORMAL LOW (ref >59)
GFR calc non Af Amer: 46 mL/min/{1.73_m2} — ABNORMAL LOW (ref >59)
Glucose: 101 mg/dL — ABNORMAL HIGH (ref 65–99)
Potassium: 3.7 mmol/L (ref 3.5–5.2)
Sodium: 139 mmol/L (ref 134–144)

## 2019-05-23 ENCOUNTER — Telehealth (HOSPITAL_COMMUNITY): Payer: Self-pay | Admitting: Emergency Medicine

## 2019-05-23 NOTE — Telephone Encounter (Signed)
Reaching out to patient to offer assistance regarding upcoming cardiac imaging study; pt verbalizes understanding of appt date/time, parking situation and where to check in, pre-test NPO status and medications ordered, and verified current allergies; name and call back number provided for further questions should they arise Koleton Duchemin RN Navigator Cardiac Imaging [facility] [phone] office [phone] cell  Pt denies covid symptoms, verbalized understanding of visitor policy. 

## 2019-05-24 ENCOUNTER — Encounter: Payer: Medicare Other | Admitting: *Deleted

## 2019-05-24 ENCOUNTER — Ambulatory Visit (HOSPITAL_COMMUNITY)
Admission: RE | Admit: 2019-05-24 | Discharge: 2019-05-24 | Disposition: A | Discharge status: Home / Self Care | Payer: Medicare Other | Source: Ambulatory Visit | Attending: Cardiology | Admitting: Cardiology

## 2019-05-24 ENCOUNTER — Ambulatory Visit (HOSPITAL_COMMUNITY): Admission: RE | Admit: 2019-05-24 | Payer: Medicare Other | Source: Ambulatory Visit

## 2019-05-24 ENCOUNTER — Other Ambulatory Visit: Payer: Self-pay

## 2019-05-24 VITALS — Temp 97.2°F

## 2019-05-24 DIAGNOSIS — I2089 Other forms of angina pectoris: Secondary | ICD-10-CM

## 2019-05-24 DIAGNOSIS — R778 Other specified abnormalities of plasma proteins: Secondary | ICD-10-CM

## 2019-05-24 DIAGNOSIS — H0102B Squamous blepharitis left eye, upper and lower eyelids: Secondary | ICD-10-CM | POA: Diagnosis not present

## 2019-05-24 DIAGNOSIS — H10413 Chronic giant papillary conjunctivitis, bilateral: Secondary | ICD-10-CM | POA: Diagnosis not present

## 2019-05-24 DIAGNOSIS — R7989 Other specified abnormal findings of blood chemistry: Secondary | ICD-10-CM | POA: Diagnosis not present

## 2019-05-24 DIAGNOSIS — I208 Other forms of angina pectoris: Secondary | ICD-10-CM | POA: Insufficient documentation

## 2019-05-24 DIAGNOSIS — H0102A Squamous blepharitis right eye, upper and lower eyelids: Secondary | ICD-10-CM | POA: Diagnosis not present

## 2019-05-24 DIAGNOSIS — Z006 Encounter for examination for normal comparison and control in clinical research program: Secondary | ICD-10-CM

## 2019-05-24 DIAGNOSIS — H16223 Keratoconjunctivitis sicca, not specified as Sjogren's, bilateral: Secondary | ICD-10-CM | POA: Diagnosis not present

## 2019-05-24 MED ORDER — NITROGLYCERIN 0.4 MG SL SUBL
0.8000 mg | SUBLINGUAL_TABLET | SUBLINGUAL | Status: DC | PRN
  Administered 2019-05-24: 0.8 mg via SUBLINGUAL
  Filled 2019-05-24 (×2): qty 25

## 2019-05-24 MED ORDER — IOHEXOL 350 MG/ML SOLN
80.0000 mL | Freq: Once | INTRAVENOUS | Status: AC | PRN
  Administered 2019-05-24: 80 mL via INTRAVENOUS

## 2019-05-24 MED ORDER — NITROGLYCERIN 0.4 MG SL SUBL
SUBLINGUAL_TABLET | SUBLINGUAL | Status: AC
  Filled 2019-05-24: qty 2

## 2019-05-24 NOTE — Research (Signed)
Subject Name: Ronald Bond  Subject met inclusion and exclusion criteria.  The informed consent form, study requirements and expectations were reviewed with the subject and questions and concerns were addressed prior to the signing of the consent form.  The subject verbalized understanding of the trial requirements.  The subject agreed to participate in the CADFEM G4 trial and signed the informed consent at 7:08 am on06/26/20.  The informed consent was obtained prior to performance of any protocol-specific procedures for the subject.  A copy of the signed informed consent was given to the subject and a copy was placed in the subject's medical record.   [name]

## 2019-05-27 ENCOUNTER — Ambulatory Visit (HOSPITAL_COMMUNITY)
Admission: RE | Admit: 2019-05-27 | Discharge: 2019-05-27 | Disposition: A | Discharge status: Home / Self Care | Payer: Medicare Other | Source: Ambulatory Visit | Attending: Cardiology | Admitting: Cardiology

## 2019-05-27 ENCOUNTER — Other Ambulatory Visit: Payer: Self-pay

## 2019-05-27 DIAGNOSIS — R778 Other specified abnormalities of plasma proteins: Secondary | ICD-10-CM

## 2019-05-27 DIAGNOSIS — R7989 Other specified abnormal findings of blood chemistry: Secondary | ICD-10-CM | POA: Diagnosis not present

## 2019-05-27 DIAGNOSIS — I208 Other forms of angina pectoris: Secondary | ICD-10-CM | POA: Diagnosis not present

## 2019-05-29 MED FILL — FLUOROMETHOLONE 0.1% DROPS: 0.1 | 34 days supply | Qty: 10 | Fill #0

## 2019-06-07 MED FILL — FLUOROMETHOLONE 0.1% DROPS: 0.1 | 34 days supply | Qty: 10 | Fill #0

## 2019-06-08 ENCOUNTER — Other Ambulatory Visit: Payer: Self-pay | Admitting: Internal Medicine

## 2019-06-08 DIAGNOSIS — E785 Hyperlipidemia, unspecified: Secondary | ICD-10-CM

## 2019-06-08 MED ORDER — ATORVASTATIN CALCIUM 40 MG PO TABS: 40.0000 mg | ORAL_TABLET | Freq: Every day | ORAL | 1 refills | Status: DC

## 2019-07-10 ENCOUNTER — Other Ambulatory Visit: Payer: Self-pay | Admitting: Internal Medicine

## 2019-07-10 DIAGNOSIS — I10 Essential (primary) hypertension: Secondary | ICD-10-CM

## 2019-07-10 DIAGNOSIS — I119 Hypertensive heart disease without heart failure: Secondary | ICD-10-CM

## 2019-07-17 ENCOUNTER — Telehealth: Payer: Self-pay | Admitting: *Deleted

## 2019-07-17 NOTE — Telephone Encounter (Signed)
Left message for patient to call to reschedule 08/23/19 3:40pm appointment with Dr. Ellyn Hack ( Dr. Ellyn Hack is out of the office)  Will also send My Chart message

## 2019-07-27 ENCOUNTER — Ambulatory Visit (INDEPENDENT_AMBULATORY_CARE_PROVIDER_SITE_OTHER): Payer: Medicare Other

## 2019-07-27 ENCOUNTER — Other Ambulatory Visit: Payer: Self-pay

## 2019-07-27 DIAGNOSIS — Z23 Encounter for immunization: Secondary | ICD-10-CM

## 2019-08-01 ENCOUNTER — Other Ambulatory Visit: Payer: Self-pay | Admitting: Internal Medicine

## 2019-08-01 DIAGNOSIS — E032 Hypothyroidism due to medicaments and other exogenous substances: Secondary | ICD-10-CM

## 2019-08-02 DIAGNOSIS — H532 Diplopia: Secondary | ICD-10-CM | POA: Diagnosis not present

## 2019-08-02 DIAGNOSIS — E0500 Thyrotoxicosis with diffuse goiter without thyrotoxic crisis or storm: Secondary | ICD-10-CM | POA: Diagnosis not present

## 2019-08-02 DIAGNOSIS — I671 Cerebral aneurysm, nonruptured: Secondary | ICD-10-CM | POA: Diagnosis not present

## 2019-08-02 LAB — BASIC METABOLIC PANEL: Creatinine: 1.5 — AB (ref 0.6–1.3)

## 2019-08-09 DIAGNOSIS — E0789 Other specified disorders of thyroid: Secondary | ICD-10-CM | POA: Diagnosis not present

## 2019-08-15 ENCOUNTER — Other Ambulatory Visit: Payer: Self-pay

## 2019-08-15 ENCOUNTER — Encounter: Payer: Self-pay | Admitting: Internal Medicine

## 2019-08-15 ENCOUNTER — Other Ambulatory Visit (INDEPENDENT_AMBULATORY_CARE_PROVIDER_SITE_OTHER): Payer: Medicare Other

## 2019-08-15 ENCOUNTER — Ambulatory Visit (INDEPENDENT_AMBULATORY_CARE_PROVIDER_SITE_OTHER): Payer: Medicare Other | Admitting: Internal Medicine

## 2019-08-15 VITALS — BP 166/80 | HR 55 | Temp 98.3°F | Resp 16 | Ht 70.0 in | Wt 215.0 lb

## 2019-08-15 DIAGNOSIS — E0500 Thyrotoxicosis with diffuse goiter without thyrotoxic crisis or storm: Secondary | ICD-10-CM | POA: Diagnosis not present

## 2019-08-15 DIAGNOSIS — D508 Other iron deficiency anemias: Secondary | ICD-10-CM | POA: Diagnosis not present

## 2019-08-15 DIAGNOSIS — N183 Chronic kidney disease, stage 3 unspecified: Secondary | ICD-10-CM

## 2019-08-15 DIAGNOSIS — I119 Hypertensive heart disease without heart failure: Secondary | ICD-10-CM

## 2019-08-15 DIAGNOSIS — E519 Thiamine deficiency, unspecified: Secondary | ICD-10-CM

## 2019-08-15 DIAGNOSIS — E039 Hypothyroidism, unspecified: Secondary | ICD-10-CM | POA: Diagnosis not present

## 2019-08-15 LAB — BASIC METABOLIC PANEL
BUN: 31 mg/dL — ABNORMAL HIGH (ref 6–23)
CO2: 30 mEq/L (ref 19–32)
Calcium: 9.3 mg/dL (ref 8.4–10.5)
Chloride: 103 mEq/L (ref 96–112)
Creatinine, Ser: 1.59 mg/dL — ABNORMAL HIGH (ref 0.40–1.50)
GFR: 51.8 mL/min — ABNORMAL LOW (ref >60.00)
Glucose, Bld: 117 mg/dL — ABNORMAL HIGH (ref 70–99)
Potassium: 3.5 mEq/L (ref 3.5–5.1)
Sodium: 138 mEq/L (ref 135–145)

## 2019-08-15 LAB — IBC PANEL
Iron: 62 ug/dL (ref 42–165)
Saturation Ratios: 17 % — ABNORMAL LOW (ref 20.0–50.0)
Transferrin: 260 mg/dL (ref 212.0–360.0)

## 2019-08-15 LAB — CBC WITH DIFFERENTIAL/PLATELET
Basophils Absolute: 0 10*3/uL (ref 0.0–0.1)
Basophils Relative: 0.1 % (ref 0.0–3.0)
Eosinophils Absolute: 0 10*3/uL (ref 0.0–0.7)
Eosinophils Relative: 0.2 % (ref 0.0–5.0)
HCT: 38.3 % — ABNORMAL LOW (ref 39.0–52.0)
Hemoglobin: 12.6 g/dL — ABNORMAL LOW (ref 13.0–17.0)
Lymphocytes Relative: 5.1 % — ABNORMAL LOW (ref 12.0–46.0)
Lymphs Abs: 0.8 10*3/uL (ref 0.7–4.0)
MCHC: 32.8 g/dL (ref 30.0–36.0)
MCV: 91 fl (ref 78.0–100.0)
Monocytes Absolute: 1.3 10*3/uL — ABNORMAL HIGH (ref 0.1–1.0)
Monocytes Relative: 8.9 % (ref 3.0–12.0)
Neutro Abs: 12.7 10*3/uL — ABNORMAL HIGH (ref 1.4–7.7)
Neutrophils Relative %: 85.7 % — ABNORMAL HIGH (ref 43.0–77.0)
Platelets: 300 10*3/uL (ref 150.0–400.0)
RBC: 4.21 Mil/uL — ABNORMAL LOW (ref 4.22–5.81)
RDW: 14.8 % (ref 11.5–15.5)
WBC: 14.9 10*3/uL — ABNORMAL HIGH (ref 4.0–10.5)

## 2019-08-15 LAB — FERRITIN: Ferritin: 61.5 ng/mL (ref 22.0–322.0)

## 2019-08-15 LAB — TSH: TSH: 0.12 u[IU]/mL — ABNORMAL LOW (ref 0.35–4.50)

## 2019-08-15 MED ORDER — INDAPAMIDE 1.25 MG PO TABS: 1.2500 mg | ORAL_TABLET | Freq: Every day | ORAL | 0 refills | Status: DC

## 2019-08-15 MED ORDER — ZOSTER VAC RECOMB ADJUVANTED 50 MCG/0.5ML IM SUSR: 0.5000 mL | Freq: Once | INTRAMUSCULAR | 1 refills | Status: AC

## 2019-08-15 NOTE — Patient Instructions (Signed)

## 2019-08-15 NOTE — Progress Notes (Signed)
Subjective:  Patient ID: Ronald Bond, male    DOB: 05-22-1946  Age: 73 y.o. MRN: [id number]  CC: Anemia, Hypertension, and Hypothyroidism   HPI Ronald Bond presents for f/up -3 weeks ago he was working in [place] and developed double vision.  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  He has been treated for 5 days with oral steroids and has noticed some improvement.  His vision is better but he still notices that his right eye does not move as well as his left does.  Outpatient Medications Prior to Visit  Medication Sig Dispense Refill  . amLODipine (NORVASC) 10 MG tablet TAKE 1 TABLET BY MOUTH  DAILY 90 tablet 1  . aspirin 81 MG EC tablet Take 81 mg by mouth daily.      Marland Kitchen atorvastatin (LIPITOR) 40 MG tablet Take 1 tablet (40 mg total) by mouth daily. 90 tablet 1  . dexlansoprazole (DEXILANT) 60 MG capsule Take 1 capsule (60 mg total) by mouth daily. 90 capsule 1  . fluticasone (FLONASE) 50 MCG/ACT nasal spray Place 2 sprays into both nostrils daily. 48 g 1  . hydroxypropyl methylcellulose / hypromellose (ISOPTO TEARS / GONIOVISC) 2.5 % ophthalmic solution Place 1 drop into both eyes 3 (three) times daily.    Marland Kitchen losartan (COZAAR) 100 MG tablet Take 1 tablet (100 mg total) by mouth daily. 90 tablet 1  . mometasone-formoterol (DULERA) 100-5 MCG/ACT AERO Inhale 2 puffs into the lungs 2 (two) times daily. 13 g 11  . Multiple Vitamin (MULTIVITAMIN) tablet Take 1 tablet by mouth daily.    Marland Kitchen oxybutynin (DITROPAN) 5 MG tablet Take 1 tablet (5 mg total) by mouth 2 (two) times daily. 180 tablet 1  . prednisoLONE acetate (PRED FORTE) 1 % ophthalmic suspension Place 1 drop into both eyes 3 (three) times daily.    . tamsulosin (FLOMAX) 0.4 MG CAPS capsule Take 1 capsule (0.4 mg total) by mouth daily after supper. 90 capsule 1  .  hydrochlorothiazide (HYDRODIURIL) 12.5 MG tablet TAKE 1 TABLET BY MOUTH  DAILY 90 tablet 0  . levothyroxine (SYNTHROID) 125 MCG tablet Take 1 tablet (125 mcg total) by mouth daily. 90 tablet 0  . carvedilol (COREG) 3.125 MG tablet Take 1 tablet (3.125 mg total) by mouth 2 (two) times daily. 180 tablet 3   No facility-administered medications prior to visit.     ROS Review of Systems  Constitutional: Positive for unexpected weight change (wt gain).  Eyes: Positive for visual disturbance. Negative for photophobia and pain.  Respiratory: Negative for cough, chest tightness, shortness of breath and wheezing.   Cardiovascular: Negative for chest pain, palpitations and leg swelling.  Gastrointestinal: Negative for abdominal pain, constipation, diarrhea, nausea and vomiting.  Endocrine: Negative.  Negative for cold intolerance and heat intolerance.  Genitourinary: Negative.  Negative for difficulty urinating and dysuria.  Musculoskeletal: Negative.  Negative for arthralgias and myalgias.  Skin: Negative.  Negative for color change and rash.  Neurological: Negative.  Negative for dizziness, weakness, light-headedness and headaches.  Hematological: Negative for adenopathy. Does not bruise/bleed easily.  Psychiatric/Behavioral: Negative.     Objective:  BP (!) 166/80 (BP Location: Left Arm, Patient Position: Sitting, Cuff Size: Large)   Pulse (!) 55   Temp 98.3 F (36.8 C) (Oral)   Resp 16   Ht  5\' 10"  (1.778 m)   Wt 215 lb (97.5 kg)   SpO2 99%   BMI 30.85 kg/m   BP Readings from Last 3 Encounters:  08/15/19 (!) 166/80  05/24/19 (!) 147/79  03/22/19 (!) 175/70    Wt Readings from Last 3 Encounters:  08/15/19 215 lb (97.5 kg)  03/22/19 220 lb (99.8 kg)  02/04/19 222 lb (100.7 kg)    Physical Exam Vitals signs reviewed.  HENT:     Nose: Nose normal.     Mouth/Throat:     Mouth: Mucous membranes are moist.  Eyes:     General: No scleral icterus.       Right eye: No discharge.         Left eye: No discharge.     Extraocular Movements:     Right eye: Abnormal extraocular motion present. No nystagmus.     Left eye: Normal extraocular motion and no nystagmus.     Conjunctiva/sclera: Conjunctivae normal.     Pupils: Pupils are equal, round, and reactive to light.     Comments: Right eye proptosis more than left eye.  Right eye does not move medially or superiorly..  Neck:     Musculoskeletal: Normal range of motion and neck supple.  Cardiovascular:     Rate and Rhythm: Normal rate and regular rhythm.     Heart sounds: No murmur.  Pulmonary:     Effort: Pulmonary effort is normal.     Breath sounds: No stridor. No wheezing, rhonchi or rales.  Abdominal:     General: Abdomen is flat.     Palpations: There is no mass.     Tenderness: There is no abdominal tenderness.  Musculoskeletal: Normal range of motion.     Right lower leg: No edema.     Left lower leg: No edema.  Skin:    General: Skin is warm and dry.     Coloration: Skin is not pale.  Neurological:     General: No focal deficit present.     Mental Status: He is alert.  Psychiatric:        Mood and Affect: Mood normal.        Behavior: Behavior normal.     Lab Results  Component Value Date   WBC 14.9 (H) 08/15/2019   HGB 12.6 (L) 08/15/2019   HCT 38.3 (L) 08/15/2019   PLT 300.0 08/15/2019   GLUCOSE 117 (H) 08/15/2019   CHOL 160 10/15/2018   TRIG 106.0 10/15/2018   HDL 61.20 10/15/2018   LDLDIRECT 75.0 03/08/2016   LDLCALC 77 10/15/2018   ALT 20 10/15/2018   AST 19 10/15/2018   NA 138 08/15/2019   K 3.5 08/15/2019   CL 103 08/15/2019   CREATININE 1.59 (H) 08/15/2019   BUN 31 (H) 08/15/2019   CO2 30 08/15/2019   TSH 0.12 (L) 08/15/2019   PSA 2.34 10/15/2018   INR 0.9 10/17/2007   HGBA1C 5.9 10/15/2018    Ct Coronary Fractional Flow Reserve Data Prep  Result Date: 06/05/2019 CLINICAL DATA:  Chest pain EXAM: CT FFR MEDICATIONS: No additional medications TECHNIQUE: The coronary CT  was sent for FFR. FINDINGS: No modeled stenosis > 30%. IMPRESSION: No evidence for hemodynamically significant coronary disease. Dalton Mclean Electronically Signed   By: Loralie Champagne M.D.   On: 05/28/2019 16:33   Ct Coronary Fractional Flow Reserve Fluid Analysis  Result Date: 06/05/2019 CLINICAL DATA:  Chest pain EXAM: CT FFR MEDICATIONS: No additional medications TECHNIQUE: The coronary CT was  sent for FFR. FINDINGS: No modeled stenosis > 30%. IMPRESSION: No evidence for hemodynamically significant coronary disease. Dalton Mclean Electronically Signed   By: Loralie Champagne M.D.   On: 05/28/2019 16:33    Assessment & Plan:   Ronald Bond was seen today for anemia, hypertension and hypothyroidism.  Diagnoses and all orders for this visit:  Hypertensive heart disease without CHF (congestive heart failure)- His blood pressure is not adequately well controlled.  I have asked him to add indapamide to his current regimen. -     Basic metabolic panel; Future -     indapamide (LOZOL) 1.25 MG tablet; Take 1 tablet (1.25 mg total) by mouth daily.  Acquired hypothyroidism- His TSH is suppressed and he has signs of proptosis and ophthalmopathy so I have asked him to stop taking the thyroid supplement.  I have also asked him to see endocrinology to see if he needs to be screened for hyperthyroidism. -     TSH; Future -     Ambulatory referral to Endocrinology  Iron deficiency anemia secondary to inadequate dietary iron intake- Improvement noted. -     CBC with Differential/Platelet; Future -     IBC panel; Future -     Ferritin; Future  Thiamine deficiency- He is still anemic.  I will monitor his iron level. -     CBC with Differential/Platelet; Future -     Vitamin B1; Future  Thyroid-related proptosis -     Ambulatory referral to Ophthalmology  Chronic renal disease, stage 3, moderately decreased glomerular filtration rate (GFR) between 30-59 mL/min/1.73 square meter ([place])- He will avoid nephrotoxic  agents.  Will try to get better control of his blood pressure.  Other orders -     Zoster Vaccine Adjuvanted [facility]) injection; Inject 0.5 mLs into the muscle once for 1 dose.   I have discontinued Ronald Graff "Kennith Mcinroy"'s levothyroxine and hydrochlorothiazide. I am also having him start on Zoster Vaccine Adjuvanted and indapamide. Additionally, I am having him maintain his aspirin, mometasone-formoterol, multivitamin, oxybutynin, fluticasone, dexlansoprazole, prednisoLONE acetate, hydroxypropyl methylcellulose / hypromellose, tamsulosin, amLODipine, carvedilol, losartan, and atorvastatin.  Meds ordered this encounter  Medications  . Zoster Vaccine Adjuvanted [facility]) injection    Sig: Inject 0.5 mLs into the muscle once for 1 dose.    Dispense:  0.5 mL    Refill:  1  . indapamide (LOZOL) 1.25 MG tablet    Sig: Take 1 tablet (1.25 mg total) by mouth daily.    Dispense:  90 tablet    Refill:  0     Follow-up: Return in about 6 weeks (around 09/26/2019).  Scarlette Calico, MD

## 2019-08-17 ENCOUNTER — Other Ambulatory Visit: Payer: Self-pay | Admitting: Internal Medicine

## 2019-08-17 DIAGNOSIS — E032 Hypothyroidism due to medicaments and other exogenous substances: Secondary | ICD-10-CM

## 2019-08-19 ENCOUNTER — Encounter: Payer: Self-pay | Admitting: Internal Medicine

## 2019-08-19 ENCOUNTER — Ambulatory Visit: Payer: Medicare Other | Admitting: Cardiology

## 2019-08-19 LAB — VITAMIN B1: Vitamin B1 (Thiamine): 57 nmol/L — ABNORMAL HIGH (ref 8–30)

## 2019-08-23 ENCOUNTER — Ambulatory Visit: Payer: Medicare Other | Admitting: Cardiology

## 2019-08-27 ENCOUNTER — Encounter: Payer: Self-pay | Admitting: Internal Medicine

## 2019-08-27 NOTE — Telephone Encounter (Signed)
FYI

## 2019-08-29 ENCOUNTER — Ambulatory Visit: Payer: Medicare Other | Admitting: Internal Medicine

## 2019-08-29 ENCOUNTER — Other Ambulatory Visit: Payer: Self-pay | Admitting: Internal Medicine

## 2019-08-29 DIAGNOSIS — I119 Hypertensive heart disease without heart failure: Secondary | ICD-10-CM

## 2019-08-29 DIAGNOSIS — I10 Essential (primary) hypertension: Secondary | ICD-10-CM

## 2019-08-30 ENCOUNTER — Other Ambulatory Visit: Payer: Self-pay | Admitting: Internal Medicine

## 2019-08-30 DIAGNOSIS — N401 Enlarged prostate with lower urinary tract symptoms: Secondary | ICD-10-CM

## 2019-08-30 DIAGNOSIS — N410 Acute prostatitis: Secondary | ICD-10-CM

## 2019-08-30 DIAGNOSIS — I119 Hypertensive heart disease without heart failure: Secondary | ICD-10-CM

## 2019-09-06 ENCOUNTER — Ambulatory Visit: Payer: Medicare Other | Admitting: Internal Medicine

## 2019-09-06 DIAGNOSIS — E0789 Other specified disorders of thyroid: Secondary | ICD-10-CM | POA: Diagnosis not present

## 2019-09-14 ENCOUNTER — Other Ambulatory Visit: Payer: Self-pay | Admitting: Internal Medicine

## 2019-09-14 DIAGNOSIS — I10 Essential (primary) hypertension: Secondary | ICD-10-CM

## 2019-09-14 DIAGNOSIS — I119 Hypertensive heart disease without heart failure: Secondary | ICD-10-CM

## 2019-09-29 ENCOUNTER — Other Ambulatory Visit: Payer: Self-pay | Admitting: Internal Medicine

## 2019-10-01 ENCOUNTER — Other Ambulatory Visit: Payer: Self-pay

## 2019-10-03 ENCOUNTER — Telehealth: Payer: Self-pay | Admitting: Internal Medicine

## 2019-10-03 NOTE — Telephone Encounter (Signed)
Last OV note faxed to [phone]

## 2019-10-03 NOTE — Telephone Encounter (Signed)
[facility] called and stated that pt called for an appointment and they would like to know if we have any additional OV notes to fax over. Please advise    [phone]

## 2019-10-04 ENCOUNTER — Other Ambulatory Visit: Payer: Self-pay

## 2019-10-04 ENCOUNTER — Encounter: Payer: Self-pay | Admitting: Internal Medicine

## 2019-10-04 ENCOUNTER — Ambulatory Visit (INDEPENDENT_AMBULATORY_CARE_PROVIDER_SITE_OTHER): Payer: Medicare Other | Admitting: Internal Medicine

## 2019-10-04 VITALS — BP 126/72 | HR 51 | Resp 16 | Ht 70.0 in | Wt 218.6 lb

## 2019-10-04 DIAGNOSIS — H11153 Pinguecula, bilateral: Secondary | ICD-10-CM | POA: Diagnosis not present

## 2019-10-04 DIAGNOSIS — H0102A Squamous blepharitis right eye, upper and lower eyelids: Secondary | ICD-10-CM | POA: Diagnosis not present

## 2019-10-04 DIAGNOSIS — N401 Enlarged prostate with lower urinary tract symptoms: Secondary | ICD-10-CM | POA: Diagnosis not present

## 2019-10-04 DIAGNOSIS — H532 Diplopia: Secondary | ICD-10-CM | POA: Diagnosis not present

## 2019-10-04 DIAGNOSIS — H0102B Squamous blepharitis left eye, upper and lower eyelids: Secondary | ICD-10-CM | POA: Diagnosis not present

## 2019-10-04 DIAGNOSIS — E0500 Thyrotoxicosis with diffuse goiter without thyrotoxic crisis or storm: Secondary | ICD-10-CM

## 2019-10-04 DIAGNOSIS — E890 Postprocedural hypothyroidism: Secondary | ICD-10-CM

## 2019-10-04 DIAGNOSIS — R3915 Urgency of urination: Secondary | ICD-10-CM | POA: Diagnosis not present

## 2019-10-04 DIAGNOSIS — N410 Acute prostatitis: Secondary | ICD-10-CM | POA: Diagnosis not present

## 2019-10-04 DIAGNOSIS — R351 Nocturia: Secondary | ICD-10-CM | POA: Diagnosis not present

## 2019-10-04 LAB — T4, FREE: Free T4: 0.64 ng/dL (ref 0.60–1.60)

## 2019-10-04 LAB — TSH: TSH: 56.28 u[IU]/mL — ABNORMAL HIGH (ref 0.35–4.50)

## 2019-10-04 LAB — HM DIABETES EYE EXAM

## 2019-10-04 MED FILL — CIPROFLOXACIN HCL 500 MG TA: 500 | 14 days supply | Qty: 28 | Fill #0

## 2019-10-04 NOTE — Progress Notes (Signed)
Name: Ronald Bond  MRN/ DOB: [id number], 03/18/1946    Age/ Sex: 73 y.o., male    PCP: Janith Lima, MD   Reason for Endocrinology Evaluation: Low TSH while on LT-4 replacement      Date of Initial Endocrinology Evaluation: 10/07/2019     HPI: [name] is a 73 y.o. male with a past medical history of HTN, dyslipidemia and CHF . The patient presented for initial endocrinology clinic visit on 10/07/2019 for consultative assistance with his subclinical hyperthyroidism.   Pt has been diagnosed with hyperthyroidism 1974 , requiring total thyroidectomy with benign pathology, but a couple of years later the pt was noted with hyperthyroidism again due tissue regrowth requiring  RAI ablation , and has been on LT-4 replacement until 07/2019 when this was stopped due to a low TSH 0.12 uIU/mL   But that pt stopped the levothyroxine for only a week as he noted palpitations and jittery sensation and decided to go back on LT-4 replacement but has been taking half a tablet only and has been feeling better .   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  He was started on prednisone, took last dose yesterday without any improvement in his vision.   He normally sees Dr. Katy Fitch locally and has an appointment to see him today   Mother with graves' disease   HISTORY:  Past Medical History:  Past Medical History:  Diagnosis Date  . GERD (gastroesophageal reflux disease)   . HTN (hypertension)   . Hyperlipidemia   . Hypothyroidism   . Osteoarthritis   . Wears glasses    Past Surgical  History:  Past Surgical History:  Procedure Laterality Date  . COLONOSCOPY    . EYE SURGERY     both cataracts  . MICROLARYNGOSCOPY Left 11/18/2013   Procedure: MICROLARYNGOSCOPY WITH REMOVAL OF GRANULOMA LEFT SIDE;  Surgeon: Izora Gala, MD;  Location: [place];  Service: ENT;  Laterality: Left;  . NM MYOVIEW LTD  06/2007   Low risk, normal.  No evidence of ischemia or infarction  . THYROIDECTOMY  1974  . TONSILLECTOMY    . TOTAL KNEE ARTHROPLASTY  2009   left      Social History:  reports that he has never smoked. He has never used smokeless tobacco. He reports current alcohol use. He reports that he does not use drugs.  Family History: family history includes Brain cancer in his mother; Heart disease in his brother and father.   HOME MEDICATIONS: Allergies as of 10/04/2019      Reactions   Lisinopril Other (See Comments)   Edema in face      Medication List       Accurate as of October 04, 2019 11:59 PM. If you have any questions, ask your nurse or doctor.        STOP taking these medications   prednisoLONE acetate 1 % ophthalmic suspension Commonly known as: PRED FORTE Stopped by: Dorita Sciara, MD     TAKE these medications   amLODipine 10 MG tablet Commonly known as:  NORVASC TAKE 1 TABLET BY MOUTH  DAILY   aspirin 81 MG EC tablet Take 81 mg by mouth daily.   atorvastatin 40 MG tablet Commonly known as: LIPITOR Take 1 tablet (40 mg total) by mouth daily.   carvedilol 3.125 MG tablet Commonly known as: COREG Take 1 tablet (3.125 mg total) by mouth 2 (two) times daily.   dexlansoprazole 60 MG capsule Commonly known as: Dexilant Take 1 capsule (60 mg total) by mouth daily.   fluticasone 50 MCG/ACT nasal spray Commonly known as: FLONASE Place 2 sprays into both nostrils daily.   hydrochlorothiazide 12.5 MG tablet Commonly known as: HYDRODIURIL TAKE 1 TABLET BY MOUTH  DAILY   hydroxypropyl methylcellulose / hypromellose 2.5  % ophthalmic solution Commonly known as: ISOPTO TEARS / GONIOVISC Place 1 drop into both eyes 3 (three) times daily.   indapamide 1.25 MG tablet Commonly known as: LOZOL Take 1 tablet (1.25 mg total) by mouth daily.   levothyroxine 112 MCG tablet Commonly known as: SYNTHROID Take 1 tablet (112 mcg total) by mouth daily.  in the morning What changed: how much to take Changed by: Dorita Sciara, MD   losartan 100 MG tablet Commonly known as: COZAAR TAKE 1 TABLET BY MOUTH  DAILY   mometasone-formoterol 100-5 MCG/ACT Aero Commonly known as: DULERA Inhale 2 puffs into the lungs 2 (two) times daily.   multivitamin tablet Take 1 tablet by mouth daily.   oxybutynin 5 MG tablet Commonly known as: DITROPAN Take 1 tablet (5 mg total) by mouth 2 (two) times daily.   tamsulosin 0.4 MG Caps capsule Commonly known as: FLOMAX TAKE 1 CAPSULE BY MOUTH  DAILY AFTER SUPPER         REVIEW OF SYSTEMS: A comprehensive ROS was conducted with the patient and is negative except as per HPI and below:  Review of Systems  Constitutional: Positive for malaise/fatigue. Negative for weight loss.  HENT: Negative for congestion and sore throat.   Eyes: Positive for blurred vision and double vision.  Respiratory: Negative for cough and shortness of breath.   Cardiovascular: Positive for palpitations. Negative for chest pain.  Gastrointestinal: Negative for constipation and diarrhea.  Neurological: Negative for tingling and tremors.       Resolved   Psychiatric/Behavioral: Negative for depression. The patient is not nervous/anxious.        OBJECTIVE:  VS: BP 126/72   Pulse (!) 51   Resp 16   Ht 5\' 10"  (1.778 m)   Wt 218 lb 9.6 oz (99.2 kg)   SpO2 99%   BMI 31.37 kg/m    Wt Readings from Last 3 Encounters:  10/04/19 218 lb 9.6 oz (99.2 kg)  08/15/19 215 lb (97.5 kg)  03/22/19 220 lb (99.8 kg)     EXAM: General: Pt appears well and is in NAD  Hydration: Well-hydrated with  moist mucous membranes and good skin turgor  Eyes: External eye exam sows bilateral proptosis and chemosis R>L , with disconjugate movement of the eyes   Neck: General: Supple without adenopathy. Thyroid: Thyroid minimal tissue felt.  No goiter or nodules appreciated. No thyroid bruit.  Lungs: Clear with good BS bilat with no rales, rhonchi, or wheezes  Heart: Auscultation: RRR.  Abdomen: Normoactive bowel sounds, soft, nontender, without masses or organomegaly palpable  Extremities:  BL LE: No pretibial edema normal ROM and strength.  Skin: Hair: Texture and amount normal with gender appropriate distribution Skin Inspection: No rashes Skin Palpation: Skin temperature, texture, and thickness normal  to palpation  Neuro: Cranial nerves: II - XII grossly intact  Motor: Normal strength throughout DTRs: 2+ and symmetric in UE without delay in relaxation phase  Mental Status: Judgment, insight: Intact Orientation: Oriented to time, place, and person Mood and affect: No depression, anxiety, or agitation     DATA REVIEWED:   Results for Ronald Bond, Ronald Bond" (MRN [id number]) as of 10/07/2019 08:23  Ref. Range 08/15/2019 15:59 10/04/2019 09:43  TSH Latest Ref Range: 0.35 - 4.50 uIU/mL 0.12 (L) 56.28 (H)  T4,Free(Direct) Latest Ref Range: 0.60 - 1.60 ng/dL  0.64   FT4 1.2  TSH 7.95 (H)  FT3 2.3  TSI : 474 (H)    ASSESSMENT/PLAN/RECOMMENDATIONS:   1. Graves' Disease:    - TSH elevated at 474 % - Pt educated extensively on the correct way to take levothyroxine (first thing in the morning with water, 30 minutes before eating or taking other medications). - Pt encouraged to double dose the following day if she were to miss a dose given long half-life of levothyroxine. - Biochemically he is hypothyroid. Pt is currently taking half of the 125 mcg tabs , will start a new dose of levothyroxine as below     Medications : Levothyroxine 112 mcg daily   2. Graves' Orbitopathy :   - We did discuss that graves' disease could attack the eyes even with euthyroid status and he needs to under the care of ophthalmology.  - Apparently steroids did not help his symptoms and still has to close his right eye to improve diplopia - I have discouraged him from driving at this time - We did briefly discuss Netty Starring , but this is out of the scope of endocrinology and will have to discuss with ophthalmology    Addendum: labs discussed with the pt on 10/07/2019 at 8:30 AM.    Signed electronically by: Mack Guise, MD  [facility] Endocrinology  [address] [place], [place] [address] Phone: [phone] FAX: [phone]   CC: Janith Lima, MD [address] [place] [address] Phone: [phone] Fax: [phone]   Return to Endocrinology clinic as below: Future Appointments  Date Time Provider [place]  10/11/2019  8:40 AM Leonie Man, MD [facility]  11/15/2019  8:45 AM [facility] None  12/16/2019  9:00 AM [facility]  01/10/2020  8:30 AM [place] Dube, Melanie Crazier, MD [facility] None

## 2019-10-04 NOTE — Patient Instructions (Signed)
-   Please stop by the lab today and again in 6 weeks, we will contact you with the results and discuss future plans if needed.  - Please see an eye doctor as soon as possible. Graves' disease does attack the eyes as well as the thyroid but they are two separate entities and controlling your thyroid does not always mean that your eyes will automatically get better.   - There's a  New medicine on the market for Graves' eye disease its called " TEPEZZA" this may be  A great medicine to explore

## 2019-10-07 ENCOUNTER — Encounter: Payer: Self-pay | Admitting: Internal Medicine

## 2019-10-07 ENCOUNTER — Other Ambulatory Visit: Payer: Self-pay | Admitting: Internal Medicine

## 2019-10-07 DIAGNOSIS — E0500 Thyrotoxicosis with diffuse goiter without thyrotoxic crisis or storm: Secondary | ICD-10-CM | POA: Insufficient documentation

## 2019-10-07 DIAGNOSIS — E890 Postprocedural hypothyroidism: Secondary | ICD-10-CM | POA: Insufficient documentation

## 2019-10-07 MED ORDER — LEVOTHYROXINE SODIUM 112 MCG PO TABS: 112.0000 ug | ORAL_TABLET | Freq: Every day | ORAL | 1 refills | Status: DC

## 2019-10-11 ENCOUNTER — Ambulatory Visit: Payer: Medicare Other | Admitting: Cardiology

## 2019-11-05 ENCOUNTER — Other Ambulatory Visit: Payer: Self-pay | Admitting: Internal Medicine

## 2019-11-05 DIAGNOSIS — I119 Hypertensive heart disease without heart failure: Secondary | ICD-10-CM

## 2019-11-08 ENCOUNTER — Other Ambulatory Visit: Payer: Self-pay

## 2019-11-08 ENCOUNTER — Ambulatory Visit: Payer: Medicare Other | Admitting: Cardiology

## 2019-11-08 ENCOUNTER — Encounter: Payer: Self-pay | Admitting: Cardiology

## 2019-11-08 VITALS — BP 136/70 | HR 59 | Ht 70.0 in | Wt 218.6 lb

## 2019-11-08 DIAGNOSIS — G4733 Obstructive sleep apnea (adult) (pediatric): Secondary | ICD-10-CM

## 2019-11-08 DIAGNOSIS — E669 Obesity, unspecified: Secondary | ICD-10-CM | POA: Diagnosis not present

## 2019-11-08 DIAGNOSIS — E785 Hyperlipidemia, unspecified: Secondary | ICD-10-CM

## 2019-11-08 DIAGNOSIS — I493 Ventricular premature depolarization: Secondary | ICD-10-CM | POA: Diagnosis not present

## 2019-11-08 DIAGNOSIS — I119 Hypertensive heart disease without heart failure: Secondary | ICD-10-CM

## 2019-11-08 NOTE — Patient Instructions (Signed)
Medication Instructions:  No changes *If you need a refill on your cardiac medications before your next appointment, please call your pharmacy*  Lab Work: None ordered If you have labs (blood work) drawn today and your tests are completely normal, you will receive your results only by: Marland Kitchen MyChart Message (if you have MyChart) OR . A paper copy in the mail If you have any lab test that is abnormal or we need to change your treatment, we will call you to review the results.  Testing/Procedures: None ordered  Follow-Up: At [facility], you and your health needs are our priority.  As part of our continuing mission to provide you with exceptional heart care, we have created designated Provider Care Teams.  These Care Teams include your primary Cardiologist (physician) and Advanced Practice Providers (APPs -  Physician Assistants and Nurse Practitioners) who all work together to provide you with the care you need, when you need it.  Your next appointment:   12 month(s)  The format for your next appointment:   In Person  Provider:   You may see Glenetta Hew, MD or one of the following Advanced Practice Providers on your designated Care Team:    Rosaria Ferries, PA-C  Jory Sims, DNP, ANP  Cadence Kathlen Mody, NP

## 2019-11-08 NOTE — Progress Notes (Signed)
Primary Care Provider: Janith Lima, MD Cardiologist: Glenetta Hew, MD Electrophysiologist:   Clinic Note: Chief Complaint  Patient presents with  . Follow-up    No new complaints.  Here for cardiac risk factors.    HPI:    Ronald Bond is a 74 y.o. male with a PMH below who presents today for 26-month follow-up for hypertension and hyperlipidemia as well as mild obesity.    Ronald Bond was last seen on March 22, 2019. At that time he was seen for ER follow-up with an episode of chest pain.  This was felt to be noncardiac in nature.  No other episodes since then.  Recent Hospitalizations:   August 02, 2019 [facility], [place] [place])-ER visit for diplopia, noted to have concern for thyroid eye disease with decreased ability to raise and lower his right eye as well as minimal abduction and abduction.  MRI brain: Noted to have evident asymmetrically enlarged enhancing right inferior rectus muscle as well as left inferior rectus muscle (muscle 5).  Bilateral medial and lateral rectus muscles demonstrate mild enlargement.  There is right greater than left proptosis.  2 mm excrescence of left inferior carotid, cannot exclude aneurysm  Reviewed  CV studies:    The following studies were reviewed today: (if available, images/films reviewed: From Epic Chart or Care Everywhere) . None   Interval History:   Ronald Bond returns here today for delayed 29-month follow-up stating that he is doing fairly well.  He says his blood pressure has been pretty well controlled.  He told me lipid about his diagnosis of thyroid eye disease mostly involving his right eye.. Or cardiac standpoint really he still has mild chest discomfort off and on in different parts of his chest that is not necessarily associated all with rest or exertion.  He also has occasional spells where he may have to wake up at night to catch his breath.  He usually wakes up sort of  gagging and then will snort, and go back to sleep.  This is what his wife tells him.  He does not wake up feeling sleepy or tired.  Feels well rested.  He really has not had any significant chest pain or pressure with rest or exertion.  No other notable cardiac symptoms.  CV Review of Symptoms (Summary): positive for - Mild musculoskeletal chest pains, but otherwise no exertional angina negative for - dyspnea on exertion, edema, irregular heartbeat, orthopnea, palpitations, paroxysmal nocturnal dyspnea, rapid heart rate, shortness of breath or Syncope/near syncope, TIA/amaurosis fugax, claudication  The patient does not have symptoms concerning for COVID-19 infection (fever, chills, cough, or new shortness of breath).  The patient is practicing social distancing. ++ Masking.  ++ Groceries/shopping.  He travels a lot for work, he does his best to maintain safe distancing and masking practices while at work.  He continues to work, and often having to go to [place].   REVIEWED OF SYSTEMS   A comprehensive ROS was performed. Review of Systems  Constitutional: Negative for malaise/fatigue.  HENT: Positive for congestion (With allergies.). Negative for nosebleeds.   Eyes: Positive for double vision and redness.       See above  Respiratory: Negative for cough, shortness of breath and wheezing.   Cardiovascular: Negative for palpitations and leg swelling.  Gastrointestinal: Negative for blood in stool, heartburn and melena.  Genitourinary: Negative for hematuria.  Musculoskeletal: Positive for joint pain (Mild arthritis pains). Negative for falls.  Neurological: Positive  for headaches. Negative for dizziness, focal weakness and weakness.  Endo/Heme/Allergies: Positive for environmental allergies.  Psychiatric/Behavioral: Negative for memory loss. The patient is not nervous/anxious and does not have insomnia.      I have reviewed and (if needed) personally updated the patient's problem  list, medications, allergies, past medical and surgical history, social and family history.   PAST MEDICAL HISTORY   Past Medical History:  Diagnosis Date  . GERD (gastroesophageal reflux disease)   . HTN (hypertension)   . Hyperlipidemia   . Hypothyroidism   . Osteoarthritis   . Wears glasses      PAST SURGICAL HISTORY   Past Surgical History:  Procedure Laterality Date  . COLONOSCOPY    . EYE SURGERY     both cataracts  . MICROLARYNGOSCOPY Left 11/18/2013   Procedure: MICROLARYNGOSCOPY WITH REMOVAL OF GRANULOMA LEFT SIDE;  Surgeon: Izora Gala, MD;  Location: [facility];  Service: ENT;  Laterality: Left;  . NM MYOVIEW LTD  06/2007   Low risk, normal.  No evidence of ischemia or infarction  . THYROIDECTOMY  1974  . TONSILLECTOMY    . TOTAL KNEE ARTHROPLASTY  2009   left     MEDICATIONS/ALLERGIES   Current Meds  Medication Sig  . amLODipine (NORVASC) 10 MG tablet TAKE 1 TABLET BY MOUTH  DAILY  . aspirin 81 MG EC tablet Take 81 mg by mouth daily.    Marland Kitchen atorvastatin (LIPITOR) 40 MG tablet Take 1 tablet (40 mg total) by mouth daily.  . fluticasone (FLONASE) 50 MCG/ACT nasal spray Place 2 sprays into both nostrils daily.  . hydroxypropyl methylcellulose / hypromellose (ISOPTO TEARS / GONIOVISC) 2.5 % ophthalmic solution Place 1 drop into both eyes 3 (three) times daily.  . indapamide (LOZOL) 1.25 MG tablet TAKE 1 TABLET (1.25 MG TOTAL) BY MOUTH DAILY.  Marland Kitchen levothyroxine (SYNTHROID) 112 MCG tablet Take 1 tablet (112 mcg total) by mouth daily.  in the morning  . losartan (COZAAR) 100 MG tablet TAKE 1 TABLET BY MOUTH  DAILY  . mometasone-formoterol (DULERA) 100-5 MCG/ACT AERO Inhale 2 puffs into the lungs 2 (two) times daily.  . Multiple Vitamin (MULTIVITAMIN) tablet Take 1 tablet by mouth daily.  . tamsulosin (FLOMAX) 0.4 MG CAPS capsule TAKE 1 CAPSULE BY MOUTH  DAILY AFTER SUPPER    Allergies  Allergen Reactions  . Lisinopril Other (See Comments)    Edema  in face     SOCIAL HISTORY/FAMILY HISTORY   Social History   Tobacco Use  . Smoking status: Never Smoker  . Smokeless tobacco: Never Used  Substance Use Topics  . Alcohol use: Yes    Comment: occassionally  . Drug use: No   Social History   Social History Narrative   Regular Exercise -  YES          Family History family history includes Brain cancer in his mother; Heart disease in his brother and father.   OBJCTIVE -PE, EKG, labs   Wt Readings from Last 3 Encounters:  11/08/19 218 lb 9.6 oz (99.2 kg)  10/04/19 218 lb 9.6 oz (99.2 kg)  08/15/19 215 lb (97.5 kg)    Physical Exam: BP 136/70   Pulse (!) 59   Ht 5\' 10"  (1.778 m)   Wt 218 lb 9.6 oz (99.2 kg)   SpO2 98%   BMI 31.37 kg/m  Physical Exam  Constitutional: He is oriented to person, place, and time. He appears well-developed and well-nourished. No distress.  Healthy-appearing.  Well-groomed.  HENT:  Head: Normocephalic and atraumatic.  Eyes:  Bilateral proptosis, right greater than left.  Definitely reduced abduction and abduction as well as elevation and depression of the right eye.  Neck: No hepatojugular reflux and no JVD present. Carotid bruit is not present.  Cardiovascular: Normal rate, regular rhythm, normal heart sounds and intact distal pulses.  No extrasystoles are present. PMI is not displaced. Exam reveals no gallop and no friction rub.  No murmur heard. Pulmonary/Chest: Effort normal and breath sounds normal. No respiratory distress. He has no wheezes. He has no rales.  Abdominal: Soft. Bowel sounds are normal. He exhibits no distension. There is no abdominal tenderness. There is no rebound.  No HSM  Musculoskeletal:        General: No edema. Normal range of motion.     Cervical back: Normal range of motion and neck supple.  Neurological: He is alert and oriented to person, place, and time.  Skin: Skin is warm and dry.  Psychiatric: He has a normal mood and affect. His behavior is  normal. Judgment and thought content normal.  Vitals reviewed.   Adult ECG Report  Rate: 59 ;  Rhythm: sinus bradycardia and Left axis deviation with LVH.  Cannot rule out AMI, age-indeterminate.  Axis is -79;   Narrative Interpretation: Stable EKG  Recent Labs: Due for labs to be rechecked by PCP this month. Lab Results  Component Value Date   CHOL 160 10/15/2018   HDL 61.20 10/15/2018   LDLCALC 77 10/15/2018   LDLDIRECT 75.0 03/08/2016   TRIG 106.0 10/15/2018   CHOLHDL 3 10/15/2018   Lab Results  Component Value Date   CREATININE 1.59 (H) 08/15/2019   BUN 31 (H) 08/15/2019   NA 138 08/15/2019   K 3.5 08/15/2019   CL 103 08/15/2019   CO2 30 08/15/2019    ASSESSMENT/PLAN    Problem List Items Addressed This Visit    Hypertensive heart disease without CHF (congestive heart failure) - Primary (Chronic)    Blood pressure well controlled.  No signs or symptoms of heart failure.  Euvolemic on exam.  Is on combination of amlodipine with low-dose carvedilol (cannot titrate because of bradycardia).  On max dose of losartan.  Where his pressure to increase, would probably switch to a more potent ARB versus add diuretic.      Hyperlipidemia with target LDL less than 100 (Chronic)    He is on atorvastatin at stable dose.  Labs been pretty well controlled by PCP.  Due for labs to be checked soon.      Frequent PVCs (Chronic)    Somewhat frequent PVCs noted on previous EKG.  No signs or symptoms of palpitations. We started carvedilol last visit.  Seems be tolerating well.      Relevant Orders   EKG 12-Lead   Obstructive sleep apnea    As far as I can tell, he is not using CPAP.      Obesity (BMI 30.0-34.9)    The patient understands the need to lose weight with diet and exercise. We have discussed specific strategies for this.          COVID-19 Education: The signs and symptoms of COVID-19 were discussed with the patient and how to seek care for testing (follow up with  PCP or arrange E-visit).   The importance of social distancing was discussed today.  I spent a total of 50minutes with the patient and chart review. >  50% of the time was  spent in direct patient consultation.  Additional time spent with chart review (studies, outside notes, etc): 5 Total Time: 20 min   Current medicines are reviewed at length with the patient today.  (+/- concerns) n/a   Patient Instructions / Medication Changes & Studies & Tests Ordered   Patient Instructions  Medication Instructions:  No changes *If you need a refill on your cardiac medications before your next appointment, please call your pharmacy*  Lab Work: None ordered If you have labs (blood work) drawn today and your tests are completely normal, you will receive your results only by: Marland Kitchen MyChart Message (if you have MyChart) OR . A paper copy in the mail If you have any lab test that is abnormal or we need to change your treatment, we will call you to review the results.  Testing/Procedures: None ordered  Follow-Up: At [facility], you and your health needs are our priority.  As part of our continuing mission to provide you with exceptional heart care, we have created designated Provider Care Teams.  These Care Teams include your primary Cardiologist (physician) and Advanced Practice Providers (APPs -  Physician Assistants and Nurse Practitioners) who all work together to provide you with the care you need, when you need it.  Your next appointment:   12 month(s)  The format for your next appointment:   In Person  Provider:   You may see Glenetta Hew, MD or one of the following Advanced Practice Providers on your designated Care Team:    Rosaria Ferries, PA-C  Jory Sims, DNP, ANP  Cadence Kathlen Mody, NP    Studies Ordered:   Orders Placed This Encounter  Procedures  . EKG 12-Lead     Glenetta Hew, M.D., M.S. Interventional Cardiologist   Pager # [phone] Phone #  [phone] [address] [place], [place] [address]   Thank you for choosing Heartcare at [facility]!!

## 2019-11-10 ENCOUNTER — Encounter: Payer: Self-pay | Admitting: Cardiology

## 2019-11-10 NOTE — Assessment & Plan Note (Signed)
Somewhat frequent PVCs noted on previous EKG.  No signs or symptoms of palpitations. We started carvedilol last visit.  Seems be tolerating well.

## 2019-11-10 NOTE — Assessment & Plan Note (Signed)
Blood pressure well controlled.  No signs or symptoms of heart failure.  Euvolemic on exam.  Is on combination of amlodipine with low-dose carvedilol (cannot titrate because of bradycardia).  On max dose of losartan.  Where his pressure to increase, would probably switch to a more potent ARB versus add diuretic.

## 2019-11-10 NOTE — Assessment & Plan Note (Signed)
As far as I can tell, he is not using CPAP.

## 2019-11-10 NOTE — Assessment & Plan Note (Signed)
He is on atorvastatin at stable dose.  Labs been pretty well controlled by PCP.  Due for labs to be checked soon.

## 2019-11-10 NOTE — Assessment & Plan Note (Signed)
The patient understands the need to lose weight with diet and exercise. We have discussed specific strategies for this.  

## 2019-11-13 ENCOUNTER — Other Ambulatory Visit: Payer: Self-pay | Admitting: Internal Medicine

## 2019-11-13 DIAGNOSIS — E0500 Thyrotoxicosis with diffuse goiter without thyrotoxic crisis or storm: Secondary | ICD-10-CM

## 2019-11-15 ENCOUNTER — Other Ambulatory Visit: Payer: Medicare Other

## 2019-11-18 ENCOUNTER — Telehealth: Payer: Self-pay | Admitting: Internal Medicine

## 2019-11-18 ENCOUNTER — Other Ambulatory Visit: Payer: Self-pay

## 2019-11-18 NOTE — Telephone Encounter (Signed)
Patient requests to be called at ph# [phone] re: Infusion for Thyroid was cancelled without giving the patient a reschedule date. Please call patient at the above ph# to advise.

## 2019-11-18 NOTE — Telephone Encounter (Signed)
I asked if he was sure he had the right office and he insisted that he did, he stated "how else would I know about the infusion" but I told him I would check to make sure I did not miss anything, will call him back and let him know this office did not place that order.

## 2019-11-18 NOTE — Patient Outreach (Signed)
[facility]) Care Management  11/18/2019  RUDOLPH SELIGER [birth year] [id number]   Medication Adherence call to Mr. Kannen Peirson Hippa Identifiers Verify spoke with patient he is past due on Atorvastatin 40 mg ,patient explain he is taking 1 tablet daily and has received it from [facility] and has enough for 2 more month. Mr. Oh is showing past due under [place].   [facility] Management Direct Dial [phone]  Fax [phone] [email or web address]

## 2019-11-18 NOTE — Telephone Encounter (Signed)
Pt stated that you referred him for an infusion, I spoke to pt and stated that I do not see any such orders but I do see where Fransico Meadow was discussed and he was encouraged to follow up with ophthalmology. Please advise because I am not sure what infusion pt is referring to but he seems to think that you set it up for him.

## 2019-11-27 DIAGNOSIS — R3915 Urgency of urination: Secondary | ICD-10-CM | POA: Diagnosis not present

## 2019-11-27 MED FILL — SOLIFENACIN SUCCINATE 5 MG: 5 | 30 days supply | Qty: 30 | Fill #0 | Status: TO

## 2019-11-27 MED FILL — SOLIFENACIN SUCCINATE 5 MG: 5 | 30 days supply | Qty: 30 | Fill #0

## 2019-12-03 ENCOUNTER — Other Ambulatory Visit (INDEPENDENT_AMBULATORY_CARE_PROVIDER_SITE_OTHER): Payer: Medicare Other

## 2019-12-03 ENCOUNTER — Telehealth: Payer: Self-pay | Admitting: Internal Medicine

## 2019-12-03 DIAGNOSIS — E0500 Thyrotoxicosis with diffuse goiter without thyrotoxic crisis or storm: Secondary | ICD-10-CM

## 2019-12-03 LAB — T4, FREE: Free T4: 0.85 ng/dL (ref 0.60–1.60)

## 2019-12-03 LAB — TSH: TSH: 12.39 u[IU]/mL — ABNORMAL HIGH (ref 0.35–4.50)

## 2019-12-03 MED ORDER — LEVOTHYROXINE SODIUM 112 MCG PO TABS: 112.0000 ug | ORAL_TABLET | ORAL | 1 refills | Status: DC

## 2019-12-03 NOTE — Telephone Encounter (Signed)
Results for ELICEO, MOGUL" (MRN [id number]) as of 12/03/2019 16:05  Ref. Range 10/04/2019 09:43 12/03/2019 10:37  TSH Latest Ref Range: 0.35 - 4.50 uIU/mL 56.28 (H) 12.39 (H)  T4,Free(Direct) Latest Ref Range: 0.60 - 1.60 ng/dL 0.64 0.85   Portal message sent :  Increased Levothyroxine 112 mcg x2 on Sunday and 1 tablet the rest of the week    [facility]

## 2019-12-06 DIAGNOSIS — E0789 Other specified disorders of thyroid: Secondary | ICD-10-CM | POA: Diagnosis not present

## 2019-12-11 ENCOUNTER — Other Ambulatory Visit: Payer: Self-pay

## 2019-12-11 ENCOUNTER — Ambulatory Visit (INDEPENDENT_AMBULATORY_CARE_PROVIDER_SITE_OTHER): Payer: Medicare Other | Admitting: Internal Medicine

## 2019-12-11 ENCOUNTER — Encounter: Payer: Self-pay | Admitting: Internal Medicine

## 2019-12-11 VITALS — BP 132/80 | HR 64 | Temp 98.3°F | Resp 16 | Ht 70.0 in | Wt 227.2 lb

## 2019-12-11 DIAGNOSIS — R7303 Prediabetes: Secondary | ICD-10-CM

## 2019-12-11 DIAGNOSIS — I10 Essential (primary) hypertension: Secondary | ICD-10-CM

## 2019-12-11 DIAGNOSIS — N401 Enlarged prostate with lower urinary tract symptoms: Secondary | ICD-10-CM | POA: Diagnosis not present

## 2019-12-11 DIAGNOSIS — D508 Other iron deficiency anemias: Secondary | ICD-10-CM | POA: Diagnosis not present

## 2019-12-11 DIAGNOSIS — Z0000 Encounter for general adult medical examination without abnormal findings: Secondary | ICD-10-CM

## 2019-12-11 DIAGNOSIS — E785 Hyperlipidemia, unspecified: Secondary | ICD-10-CM

## 2019-12-11 DIAGNOSIS — E519 Thiamine deficiency, unspecified: Secondary | ICD-10-CM | POA: Diagnosis not present

## 2019-12-11 DIAGNOSIS — N1832 Chronic kidney disease, stage 3b: Secondary | ICD-10-CM

## 2019-12-11 DIAGNOSIS — R351 Nocturia: Secondary | ICD-10-CM | POA: Diagnosis not present

## 2019-12-11 LAB — CBC WITH DIFFERENTIAL/PLATELET
Basophils Absolute: 0.1 10*3/uL (ref 0.0–0.1)
Basophils Relative: 0.8 % (ref 0.0–3.0)
Eosinophils Absolute: 0.3 10*3/uL (ref 0.0–0.7)
Eosinophils Relative: 4.3 % (ref 0.0–5.0)
HCT: 36.8 % — ABNORMAL LOW (ref 39.0–52.0)
Hemoglobin: 12.3 g/dL — ABNORMAL LOW (ref 13.0–17.0)
Lymphocytes Relative: 19.8 % (ref 12.0–46.0)
Lymphs Abs: 1.6 10*3/uL (ref 0.7–4.0)
MCHC: 33.5 g/dL (ref 30.0–36.0)
MCV: 93.9 fl (ref 78.0–100.0)
Monocytes Absolute: 0.8 10*3/uL (ref 0.1–1.0)
Monocytes Relative: 10.2 % (ref 3.0–12.0)
Neutro Abs: 5.2 10*3/uL (ref 1.4–7.7)
Neutrophils Relative %: 64.9 % (ref 43.0–77.0)
Platelets: 271 10*3/uL (ref 150.0–400.0)
RBC: 3.92 Mil/uL — ABNORMAL LOW (ref 4.22–5.81)
RDW: 13.8 % (ref 11.5–15.5)
WBC: 8 10*3/uL (ref 4.0–10.5)

## 2019-12-11 LAB — BASIC METABOLIC PANEL
BUN: 17 mg/dL (ref 6–23)
CO2: 30 mEq/L (ref 19–32)
Calcium: 9.5 mg/dL (ref 8.4–10.5)
Chloride: 102 mEq/L (ref 96–112)
Creatinine, Ser: 1.46 mg/dL (ref 0.40–1.50)
GFR: 57.1 mL/min — ABNORMAL LOW (ref >60.00)
Glucose, Bld: 90 mg/dL (ref 70–99)
Potassium: 3.4 mEq/L — ABNORMAL LOW (ref 3.5–5.1)
Sodium: 140 mEq/L (ref 135–145)

## 2019-12-11 LAB — IBC PANEL
Iron: 65 ug/dL (ref 42–165)
Saturation Ratios: 16.1 % — ABNORMAL LOW (ref 20.0–50.0)
Transferrin: 288 mg/dL (ref 212.0–360.0)

## 2019-12-11 LAB — URINALYSIS, ROUTINE W REFLEX MICROSCOPIC
Bilirubin Urine: NEGATIVE
Hgb urine dipstick: NEGATIVE
Ketones, ur: NEGATIVE
Leukocytes,Ua: NEGATIVE
Nitrite: NEGATIVE
Specific Gravity, Urine: 1.025 (ref 1.000–1.030)
Total Protein, Urine: 30 — AB
Urine Glucose: NEGATIVE
Urobilinogen, UA: 0.2 (ref 0.0–1.0)
pH: 6 (ref 5.0–8.0)

## 2019-12-11 LAB — FERRITIN: Ferritin: 66.4 ng/mL (ref 22.0–322.0)

## 2019-12-11 LAB — LIPID PANEL
Cholesterol: 163 mg/dL (ref 0–200)
HDL: 55.4 mg/dL (ref >39.00)
LDL Cholesterol: 81 mg/dL (ref 0–99)
NonHDL: 107.67
Total CHOL/HDL Ratio: 3
Triglycerides: 134 mg/dL (ref 0.0–149.0)
VLDL: 26.8 mg/dL (ref 0.0–40.0)

## 2019-12-11 LAB — HEMOGLOBIN A1C: Hgb A1c MFr Bld: 6.2 % (ref 4.6–6.5)

## 2019-12-11 LAB — PSA: PSA: 2.6 ng/mL (ref 0.10–4.00)

## 2019-12-11 NOTE — Patient Instructions (Signed)

## 2019-12-11 NOTE — Progress Notes (Signed)
Subjective:  Patient ID: Ronald Bond, male    DOB: [birth year]  Age: 74 y.o. MRN: [id number]  CC: Annual Exam, Hypertension, Hypothyroidism, Hyperlipidemia, and Anemia  This visit occurred during the SARS-CoV-2 public health emergency.  Safety protocols were in place, including screening questions prior to the visit, additional usage of staff PPE, and extensive cleaning of exam room while observing appropriate contact time as indicated for disinfecting solutions.    HPI Ronald Bond presents for a CPX.  He continues to struggle with thyroid disease.  He was recently seen again at the [facility] and was prescribed another course of prednisone.  He tells me that he is going to [facility] soon to start radiation therapy.  He is active and denies any recent episodes of chest pain, shortness of breath, palpitations, edema, or fatigue.  Outpatient Medications Prior to Visit  Medication Sig Dispense Refill  . amLODipine (NORVASC) 10 MG tablet TAKE 1 TABLET BY MOUTH  DAILY 90 tablet 1  . aspirin 81 MG EC tablet Take 81 mg by mouth daily.      Marland Kitchen atorvastatin (LIPITOR) 40 MG tablet Take 1 tablet (40 mg total) by mouth daily. 90 tablet 1  . fluticasone (FLONASE) 50 MCG/ACT nasal spray Place 2 sprays into both nostrils daily. 48 g 1  . indapamide (LOZOL) 1.25 MG tablet TAKE 1 TABLET (1.25 MG TOTAL) BY MOUTH DAILY. 90 tablet 0  . levothyroxine (SYNTHROID) 112 MCG tablet Take 1 tablet (112 mcg total) by mouth as directed. 8x a week 90 tablet 1  . losartan (COZAAR) 100 MG tablet TAKE 1 TABLET BY MOUTH  DAILY 90 tablet 1  . mometasone-formoterol (DULERA) 100-5 MCG/ACT AERO Inhale 2 puffs into the lungs 2 (two) times daily. 13 g 11  . Multiple Vitamin (MULTIVITAMIN) tablet Take 1 tablet by mouth daily.    . tamsulosin (FLOMAX) 0.4 MG CAPS capsule TAKE 1 CAPSULE BY MOUTH  DAILY AFTER SUPPER 90 capsule 1  . hydroxypropyl methylcellulose / hypromellose (ISOPTO TEARS / GONIOVISC) 2.5 %  ophthalmic solution Place 1 drop into both eyes 3 (three) times daily.    . carvedilol (COREG) 3.125 MG tablet Take 1 tablet (3.125 mg total) by mouth 2 (two) times daily. 180 tablet 3  . solifenacin (VESICARE) 5 MG tablet      No facility-administered medications prior to visit.    ROS Review of Systems  Constitutional: Positive for unexpected weight change (wt gain). Negative for diaphoresis, fatigue and fever.  HENT: Negative.  Negative for trouble swallowing.   Eyes: Positive for visual disturbance. Negative for photophobia and discharge.  Respiratory: Negative for cough, chest tightness, shortness of breath and wheezing.   Cardiovascular: Negative for chest pain, palpitations and leg swelling.  Gastrointestinal: Negative for abdominal pain, blood in stool, constipation, diarrhea, nausea and vomiting.  Endocrine: Negative.  Negative for cold intolerance and heat intolerance.  Genitourinary: Negative.  Negative for difficulty urinating, dysuria and hematuria.  Musculoskeletal: Negative for arthralgias and myalgias.  Skin: Negative.  Negative for color change, pallor and rash.  Neurological: Negative for dizziness.  Hematological: Negative for adenopathy. Does not bruise/bleed easily.  Psychiatric/Behavioral: Negative.     Objective:  BP 132/80 (BP Location: Left Arm, Patient Position: Sitting, Cuff Size: Normal)   Pulse 64   Temp 98.3 F (36.8 C) (Oral)   Resp 16   Ht 5\' 10"  (1.778 m)   Wt 227 lb 3.2 oz (103.1 kg)   SpO2 99%   BMI  32.60 kg/m   BP Readings from Last 3 Encounters:  12/11/19 132/80  11/08/19 136/70  10/04/19 126/72    Wt Readings from Last 3 Encounters:  12/11/19 227 lb 3.2 oz (103.1 kg)  11/08/19 218 lb 9.6 oz (99.2 kg)  10/04/19 218 lb 9.6 oz (99.2 kg)    Physical Exam Vitals reviewed.  Constitutional:      Appearance: Normal appearance.  HENT:     Nose: Nose normal.     Mouth/Throat:     Mouth: Mucous membranes are moist.  Eyes:      General: No scleral icterus.    Extraocular Movements: Extraocular movements intact.     Right eye: Normal extraocular motion and no nystagmus.     Left eye: Normal extraocular motion and no nystagmus.     Conjunctiva/sclera: Conjunctivae normal.     Comments: There is bilateral proptosis.  More so on the right than the left.  Cardiovascular:     Rate and Rhythm: Normal rate and regular rhythm.     Heart sounds: No murmur.  Pulmonary:     Effort: Pulmonary effort is normal.     Breath sounds: No stridor. No wheezing, rhonchi or rales.  Abdominal:     General: Abdomen is flat. Bowel sounds are normal. There is no distension.     Palpations: Abdomen is soft. There is no hepatomegaly or splenomegaly.     Tenderness: There is no abdominal tenderness.     Hernia: There is no hernia in the left inguinal area or right inguinal area.  Genitourinary:    Pubic Area: No rash.      Penis: Normal and uncircumcised. No phimosis, paraphimosis, hypospadias, erythema, tenderness, discharge, swelling or lesions.      Testes: Normal.        Right: Mass not present.        Left: Mass not present.     Epididymis:     Right: Normal. Not inflamed or enlarged. No mass.     Left: Normal. Not inflamed or enlarged. No mass.     Prostate: Enlarged (1+ smooth symm BPH). Not tender and no nodules present.     Rectum: Normal. Guaiac result negative. No tenderness, anal fissure, external hemorrhoid or internal hemorrhoid. Normal anal tone.  Musculoskeletal:     Cervical back: Neck supple.  Lymphadenopathy:     Cervical: No cervical adenopathy.     Lower Body: No right inguinal adenopathy. No left inguinal adenopathy.  Neurological:     Mental Status: He is alert.     Lab Results  Component Value Date   WBC 8.0 12/11/2019   HGB 12.3 (L) 12/11/2019   HCT 36.8 (L) 12/11/2019   PLT 271.0 12/11/2019   GLUCOSE 90 12/11/2019   CHOL 163 12/11/2019   TRIG 134.0 12/11/2019   HDL 55.40 12/11/2019   LDLDIRECT  75.0 03/08/2016   LDLCALC 81 12/11/2019   ALT 20 10/15/2018   AST 19 10/15/2018   NA 140 12/11/2019   K 3.4 (L) 12/11/2019   CL 102 12/11/2019   CREATININE 1.46 12/11/2019   BUN 17 12/11/2019   CO2 30 12/11/2019   TSH 12.39 (H) 12/03/2019   PSA 2.60 12/11/2019   INR 0.9 10/17/2007   HGBA1C 6.2 12/11/2019    CT CORONARY FRACTIONAL FLOW RESERVE DATA PREP  Result Date: 06/05/2019 CLINICAL DATA:  Chest pain EXAM: CT FFR MEDICATIONS: No additional medications TECHNIQUE: The coronary CT was sent for FFR. FINDINGS: No modeled stenosis > 30%. IMPRESSION:  No evidence for hemodynamically significant coronary disease. Dalton Mclean Electronically Signed   By: Loralie Champagne M.D.   On: 05/28/2019 16:33   CT CORONARY FRACTIONAL FLOW RESERVE FLUID ANALYSIS  Result Date: 06/05/2019 CLINICAL DATA:  Chest pain EXAM: CT FFR MEDICATIONS: No additional medications TECHNIQUE: The coronary CT was sent for FFR. FINDINGS: No modeled stenosis > 30%. IMPRESSION: No evidence for hemodynamically significant coronary disease. Dalton Mclean Electronically Signed   By: Loralie Champagne M.D.   On: 05/28/2019 16:33    Assessment & Plan:   Joskar was seen today for annual exam, hypertension, hypothyroidism, hyperlipidemia and anemia.  Diagnoses and all orders for this visit:  Essential hypertension, benign- His blood pressure is adequately well controlled.  Electrolytes and renal function are normal. -     Basic metabolic panel  Iron deficiency anemia secondary to inadequate dietary iron intake- He continues to be anemic but his iron level is normal.  This is likely the anemia of chronic disease. -     CBC with Differential -     IBC panel -     Ferritin  Thiamine deficiency- His H&H are still low.  I will monitor his thiamine level. -     CBC with Differential -     Vitamin B1  Prediabetes- His A1c is at 6.2%.  Medical therapy is not indicated. -     Hemoglobin A1c -     Basic metabolic panel  Routine  general medical examination at a health care facility- Exam completed, labs reviewed, vaccines reviewed, cancer screenings are up-to-date, patient education was given.  BPH associated with nocturia- His PSA is not rising which is a reassuring sign that he does not have prostate cancer.  He has no symptoms that need to be treated. -     PSA -     Urinalysis, Routine w reflex microscopic  Stage 3b chronic kidney disease- His renal function is stable.  He agrees to avoid nephrotoxic agents.  Will continue to maintain control of his blood pressure. -     Basic metabolic panel -     Urinalysis, Routine w reflex microscopic  Hyperlipidemia with target LDL less than 100- He has achieved his LDL goal and is doing well on the statin. -     Lipid panel   I have discontinued [name] "Andron Reagle"'s hydroxypropyl methylcellulose / hypromellose and solifenacin. I am also having him maintain his aspirin, mometasone-formoterol, multivitamin, fluticasone, carvedilol, atorvastatin, tamsulosin, amLODipine, losartan, indapamide, and levothyroxine.  No orders of the defined types were placed in this encounter.    Follow-up: Return in about 6 months (around 06/09/2020).  Scarlette Calico, MD

## 2019-12-12 LAB — HM DIABETES EYE EXAM

## 2019-12-12 NOTE — Addendum Note (Signed)
Addended by: Janith Lima on: 12/12/2019 01:48 PM   Modules accepted: Orders

## 2019-12-14 LAB — VITAMIN B1: Vitamin B1 (Thiamine): 30 nmol/L (ref 8–30)

## 2019-12-16 ENCOUNTER — Other Ambulatory Visit: Payer: Self-pay | Admitting: Internal Medicine

## 2019-12-16 ENCOUNTER — Ambulatory Visit: Payer: Medicare Other

## 2019-12-16 ENCOUNTER — Encounter: Payer: Self-pay | Admitting: Internal Medicine

## 2019-12-16 DIAGNOSIS — T502X5A Adverse effect of carbonic-anhydrase inhibitors, benzothiadiazides and other diuretics, initial encounter: Secondary | ICD-10-CM

## 2019-12-16 DIAGNOSIS — E876 Hypokalemia: Secondary | ICD-10-CM | POA: Insufficient documentation

## 2019-12-16 MED ORDER — POTASSIUM CHLORIDE CRYS ER 20 MEQ PO TBCR: 20.0000 meq | EXTENDED_RELEASE_TABLET | Freq: Two times a day (BID) | ORAL | 1 refills | Status: DC

## 2019-12-16 MED FILL — ATORVASTATIN 40 MG TABLET: 40 | 90 days supply | Qty: 90 | Fill #1

## 2019-12-16 MED FILL — POTASSIUM CHLORIDE CRYS ER: 20 | 30 days supply | Qty: 60 | Fill #0

## 2020-01-06 DIAGNOSIS — D3161 Benign neoplasm of unspecified site of right orbit: Secondary | ICD-10-CM | POA: Diagnosis not present

## 2020-01-06 DIAGNOSIS — H3323 Serous retinal detachment, bilateral: Secondary | ICD-10-CM | POA: Diagnosis not present

## 2020-01-06 DIAGNOSIS — H3581 Retinal edema: Secondary | ICD-10-CM | POA: Diagnosis not present

## 2020-01-06 DIAGNOSIS — E0500 Thyrotoxicosis with diffuse goiter without thyrotoxic crisis or storm: Secondary | ICD-10-CM | POA: Diagnosis not present

## 2020-01-06 DIAGNOSIS — Z923 Personal history of irradiation: Secondary | ICD-10-CM | POA: Diagnosis not present

## 2020-01-06 DIAGNOSIS — D3162 Benign neoplasm of unspecified site of left orbit: Secondary | ICD-10-CM | POA: Diagnosis not present

## 2020-01-08 ENCOUNTER — Other Ambulatory Visit: Payer: Self-pay

## 2020-01-08 DIAGNOSIS — Z923 Personal history of irradiation: Secondary | ICD-10-CM | POA: Diagnosis not present

## 2020-01-08 DIAGNOSIS — E0500 Thyrotoxicosis with diffuse goiter without thyrotoxic crisis or storm: Secondary | ICD-10-CM | POA: Diagnosis not present

## 2020-01-10 ENCOUNTER — Other Ambulatory Visit: Payer: Self-pay | Admitting: Cardiology

## 2020-01-10 ENCOUNTER — Other Ambulatory Visit: Payer: Self-pay

## 2020-01-10 ENCOUNTER — Ambulatory Visit: Payer: Medicare Other | Admitting: Internal Medicine

## 2020-01-10 ENCOUNTER — Encounter: Payer: Self-pay | Admitting: Internal Medicine

## 2020-01-10 VITALS — BP 146/78 | HR 53 | Temp 97.6°F | Ht 70.0 in | Wt 226.4 lb

## 2020-01-10 DIAGNOSIS — E890 Postprocedural hypothyroidism: Secondary | ICD-10-CM

## 2020-01-10 DIAGNOSIS — Z923 Personal history of irradiation: Secondary | ICD-10-CM | POA: Diagnosis not present

## 2020-01-10 DIAGNOSIS — E0500 Thyrotoxicosis with diffuse goiter without thyrotoxic crisis or storm: Secondary | ICD-10-CM | POA: Diagnosis not present

## 2020-01-10 LAB — TSH: TSH: 9.93 u[IU]/mL — ABNORMAL HIGH (ref 0.35–4.50)

## 2020-01-10 LAB — T4, FREE: Free T4: 1.01 ng/dL (ref 0.60–1.60)

## 2020-01-10 MED FILL — SOLIFENACIN SUCCINATE 5 MG: 5 | 30 days supply | Qty: 30 | Fill #0

## 2020-01-10 NOTE — Patient Instructions (Signed)

## 2020-01-10 NOTE — Progress Notes (Signed)
Name: Ronald Bond  MRN/ DOB: [id number], 1946/01/04    Age/ Sex: 74 y.o., male     PCP: Janith Lima, MD   Reason for Endocrinology Evaluation:  Ronald Bond' disease     Initial Endocrinology Clinic Visit: 10/07/2019    PATIENT IDENTIFIER: [name] is a 74 y.o., male with a past medical history of Graves' disease, HTN, dyslipidemia and CHF. He has followed with [facility] since 10/07/2019 for consultative assistance with management of his Graves' disease  HISTORICAL SUMMARY: The patient was first diagnosed with hyperthyroidism 1974 , requiring total thyroidectomy with benign pathology, but a couple of years later the pt was noted with hyperthyroidism again due tissue regrowth, requiring  RAI ablation , and was on LT-4 replacement until 07/2019 when this was stopped due to a low TSH 0.12 uIU/mL   But the patient restarted his L T4 replacement weeks later due to palpitation and abnormal sensation.  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  He was treated with prednisone, without any improvement in his vision.  He was unable to obtain the [place] due to shortage per patient.  He is starting radiation therapy in [place] the week of January 13, 2020.  He normally sees Dr. Katy Fitch locally and has an appointment to see him today   Mother with graves' disease    SUBJECTIVE:   During last visit (10/07/2019): I have increased his levothyroxine to 1 full tablet of 112 MCG daily.  Today (01/10/2020):  [name] is here for follow-up on Graves' disease.   There was a miscommunication about his levothyroxine dosing, he did increase it to 2 tablets on Sunday and 1 tablet the rest of the week but he only did that for 1 week.  He has been back to 1 tablet daily  He continues ot have a right eye pain, as long as he been wearing patch to prevent double vision.  Radiation in [place] starting Monday  Has gained a few pounds , denies constipation but has noted more fatigue   Has occasional headaches.       ROS:  As per HPI.   HISTORY:  Past Medical History:  Past Medical History:  Diagnosis Date  . GERD (gastroesophageal reflux disease)   . HTN (hypertension)   . Hyperlipidemia   . Hypothyroidism   . Osteoarthritis   . Wears glasses     Past Surgical History:  Past Surgical History:  Procedure Laterality Date  . COLONOSCOPY    . EYE SURGERY     both cataracts  . 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 NM MYOVIEW LTD  06/2007   Low risk, normal.  No evidence of ischemia or infarction  . THYROIDECTOMY  1974  . TONSILLECTOMY    . TOTAL KNEE ARTHROPLASTY  2009   left     Social History:  reports that he has never smoked. He has never used smokeless tobacco. He reports  current alcohol use. He reports that he does not use drugs. Family History:  Family History  Problem Relation Age of Onset  . Brain cancer Mother   . Heart disease Father   . Heart disease Brother       HOME MEDICATIONS: Allergies as of 01/10/2020      Reactions   Lisinopril Other (See Comments)   Edema in face      Medication List       Accurate as of January 10, 2020  8:30 AM. If you have any questions, ask your nurse or doctor.        amLODipine 10 MG tablet Commonly known as: NORVASC TAKE 1 TABLET BY MOUTH  DAILY   aspirin 81 MG EC tablet Take 81 mg by mouth daily.   atorvastatin  40 MG tablet Commonly known as: LIPITOR Take 1 tablet (40 mg total) by mouth daily.   fluticasone 50 MCG/ACT nasal spray Commonly known as: FLONASE Place 2 sprays into both nostrils daily.   indapamide 1.25 MG tablet Commonly known as: LOZOL TAKE 1 TABLET (1.25 MG TOTAL) BY MOUTH DAILY.   levothyroxine 112 MCG tablet Commonly known as: SYNTHROID Take 1 tablet (112 mcg total) by mouth as directed. 8x a week   losartan 100 MG tablet Commonly known as: COZAAR TAKE 1 TABLET BY MOUTH  DAILY   mometasone-formoterol 100-5 MCG/ACT Aero Commonly known as: DULERA Inhale 2 puffs into the lungs 2 (two) times daily.   multivitamin tablet Take 1 tablet by mouth daily.   potassium chloride SA 20 MEQ tablet Commonly known as: KLOR-CON Take 1 tablet (20 mEq total) by mouth 2 (two) times daily.   tamsulosin 0.4 MG Caps capsule Commonly known as: FLOMAX TAKE 1 CAPSULE BY MOUTH  DAILY AFTER SUPPER         OBJECTIVE:   PHYSICAL EXAM: VS: BP (!) 146/78 (BP Location: Left Arm, Patient Position: Sitting, Cuff Size: Large)   Pulse (!) 53   Temp 97.6 F (36.4 C)   Ht 5\' 10"  (1.778 m)   Wt 226 lb 6.4 oz (102.7 kg)   SpO2 98%   BMI 32.49 kg/m    EXAM: General: Pt appears well and is in NAD  Eyes: External eye exam sows bilateral proptosis and chemosis R>L , with disconjugate movement of the eyes   Neck: General: Supple without adenopathy. Thyroid: Thyroid size normal.  No goiter or nodules appreciated. No thyroid bruit.  Lungs: Clear with good BS bilat with no rales, rhonchi, or wheezes  Heart: Auscultation: RRR.  Extremities:  BL LE: No pretibial edema normal ROM and strength.  Mental Status: Judgment, insight: Intact Memory: Intact for recent and remote events Mood and affect: No depression, anxiety, or agitation     DATA REVIEWED: Results for RICHRD, DA" (MRN [id number]) as of 01/10/2020 16:38  Ref. Range 10/04/2019 09:43 12/03/2019 10:37 12/11/2019 11:06  01/10/2020 08:43  TSH Latest Ref Range: 0.35 - 4.50 uIU/mL 56.28 (H) 12.39 (H)  9.93 (H)  T4,Free(Direct) Latest Ref Range: 0.60 - 1.60 ng/dL 0.64 0.85  1.01   07/2019 TSI : 474 (H)     ASSESSMENT / PLAN / RECOMMENDATIONS:   1. Graves' disease, status post thyroidectomy followed by radioactive iodine ablation:  -The patient is clinically euthyroid -Pt takes levothyroxine appropriately, but due to miscommunication and the dosing he has continued to take 1 tablet daily.    Medications   Levothyroxine 112 MCG daily, except Sundays take 2 tablets  2. Graves' Orbitopathy :  - We did discuss that graves' disease could attack the eyes even with euthyroid status and he needs to continue to be under the care of ophthalmology.  - Steroid therapy has been unsuccessful, he has been unable to obtain [place].  -He is starting radiation therapy next week in [place].  Follow-up in 6 months Repeat labs in 8 weeks.      Signed electronically by: Mack Guise, MD  [facility] Endocrinology  [facility] [address] [place], [place] [address] Phone: [phone] FAX: [phone]      CC: Janith Lima, MD [place] [address] Phone: [phone]  Fax: [phone]   Return to Endocrinology clinic as below: No future appointments.

## 2020-01-13 DIAGNOSIS — Z923 Personal history of irradiation: Secondary | ICD-10-CM | POA: Diagnosis not present

## 2020-01-13 DIAGNOSIS — E0500 Thyrotoxicosis with diffuse goiter without thyrotoxic crisis or storm: Secondary | ICD-10-CM | POA: Diagnosis not present

## 2020-01-14 DIAGNOSIS — Z923 Personal history of irradiation: Secondary | ICD-10-CM | POA: Diagnosis not present

## 2020-01-14 DIAGNOSIS — E0500 Thyrotoxicosis with diffuse goiter without thyrotoxic crisis or storm: Secondary | ICD-10-CM | POA: Diagnosis not present

## 2020-01-15 DIAGNOSIS — E0500 Thyrotoxicosis with diffuse goiter without thyrotoxic crisis or storm: Secondary | ICD-10-CM | POA: Diagnosis not present

## 2020-01-15 DIAGNOSIS — Z923 Personal history of irradiation: Secondary | ICD-10-CM | POA: Diagnosis not present

## 2020-01-16 DIAGNOSIS — E0500 Thyrotoxicosis with diffuse goiter without thyrotoxic crisis or storm: Secondary | ICD-10-CM | POA: Diagnosis not present

## 2020-01-16 DIAGNOSIS — Z923 Personal history of irradiation: Secondary | ICD-10-CM | POA: Diagnosis not present

## 2020-01-17 DIAGNOSIS — E0500 Thyrotoxicosis with diffuse goiter without thyrotoxic crisis or storm: Secondary | ICD-10-CM | POA: Diagnosis not present

## 2020-01-17 DIAGNOSIS — Z923 Personal history of irradiation: Secondary | ICD-10-CM | POA: Diagnosis not present

## 2020-01-20 DIAGNOSIS — Z923 Personal history of irradiation: Secondary | ICD-10-CM | POA: Diagnosis not present

## 2020-01-20 DIAGNOSIS — E0500 Thyrotoxicosis with diffuse goiter without thyrotoxic crisis or storm: Secondary | ICD-10-CM | POA: Diagnosis not present

## 2020-01-21 DIAGNOSIS — E0500 Thyrotoxicosis with diffuse goiter without thyrotoxic crisis or storm: Secondary | ICD-10-CM | POA: Diagnosis not present

## 2020-01-21 DIAGNOSIS — Z923 Personal history of irradiation: Secondary | ICD-10-CM | POA: Diagnosis not present

## 2020-01-22 DIAGNOSIS — E0500 Thyrotoxicosis with diffuse goiter without thyrotoxic crisis or storm: Secondary | ICD-10-CM | POA: Diagnosis not present

## 2020-01-22 DIAGNOSIS — Z923 Personal history of irradiation: Secondary | ICD-10-CM | POA: Diagnosis not present

## 2020-01-23 DIAGNOSIS — E0500 Thyrotoxicosis with diffuse goiter without thyrotoxic crisis or storm: Secondary | ICD-10-CM | POA: Diagnosis not present

## 2020-01-23 DIAGNOSIS — Z923 Personal history of irradiation: Secondary | ICD-10-CM | POA: Diagnosis not present

## 2020-01-24 DIAGNOSIS — Z923 Personal history of irradiation: Secondary | ICD-10-CM | POA: Diagnosis not present

## 2020-01-24 DIAGNOSIS — E0789 Other specified disorders of thyroid: Secondary | ICD-10-CM | POA: Diagnosis not present

## 2020-01-24 DIAGNOSIS — E0500 Thyrotoxicosis with diffuse goiter without thyrotoxic crisis or storm: Secondary | ICD-10-CM | POA: Diagnosis not present

## 2020-01-26 ENCOUNTER — Other Ambulatory Visit: Payer: Self-pay | Admitting: Cardiology

## 2020-01-27 ENCOUNTER — Ambulatory Visit: Payer: Medicare Other | Attending: Internal Medicine

## 2020-01-27 DIAGNOSIS — Z23 Encounter for immunization: Secondary | ICD-10-CM

## 2020-01-27 MED FILL — POTASSIUM CHLORIDE CRYS ER: 20 | 30 days supply | Qty: 60 | Fill #1

## 2020-01-27 NOTE — Progress Notes (Signed)
   Covid-19 Vaccination Clinic  Name:  Ronald Bond    MRN: [id number] DOB: [birth year]  01/27/2020  [name] was observed post Covid-19 immunization for 15 minutes without incidence. He was provided with Vaccine Information Sheet and instruction to access the V-Safe system.   Mr. Slice was instructed to call 911 with any severe reactions post vaccine: Marland Kitchen Difficulty breathing  . Swelling of your face and throat  . A fast heartbeat  . A bad rash all over your body  . Dizziness and weakness    Immunizations Administered    Name Date Dose VIS Date Route   Pfizer COVID-19 Vaccine 01/27/2020  8:25 AM 0.3 mL 11/08/2019 Intramuscular   Manufacturer: [place]   Lot: [id number]   [place]: [id number]

## 2020-01-29 MED FILL — predniSONE 10 MG TABS: 10 | 21 days supply | Qty: 63 | Fill #0

## 2020-01-30 ENCOUNTER — Other Ambulatory Visit: Payer: Self-pay | Admitting: Internal Medicine

## 2020-01-30 DIAGNOSIS — I119 Hypertensive heart disease without heart failure: Secondary | ICD-10-CM

## 2020-01-30 DIAGNOSIS — I10 Essential (primary) hypertension: Secondary | ICD-10-CM

## 2020-02-05 ENCOUNTER — Other Ambulatory Visit: Payer: Self-pay | Admitting: Internal Medicine

## 2020-02-05 ENCOUNTER — Encounter: Payer: Self-pay | Admitting: Internal Medicine

## 2020-02-05 ENCOUNTER — Other Ambulatory Visit: Payer: Self-pay

## 2020-02-05 DIAGNOSIS — E032 Hypothyroidism due to medicaments and other exogenous substances: Secondary | ICD-10-CM

## 2020-02-05 MED ORDER — LEVOTHYROXINE SODIUM 112 MCG PO TABS: 112.0000 ug | ORAL_TABLET | ORAL | 1 refills | Status: DC

## 2020-02-06 ENCOUNTER — Other Ambulatory Visit: Payer: Self-pay

## 2020-02-06 MED ORDER — LEVOTHYROXINE SODIUM 112 MCG PO TABS: 112.0000 ug | ORAL_TABLET | ORAL | 1 refills | Status: DC

## 2020-02-06 MED FILL — LEVOTHYROXINE SODIUM 112 MC: 112 | 78 days supply | Qty: 90 | Fill #0

## 2020-02-08 ENCOUNTER — Other Ambulatory Visit: Payer: Self-pay | Admitting: Internal Medicine

## 2020-02-08 DIAGNOSIS — R351 Nocturia: Secondary | ICD-10-CM

## 2020-02-08 DIAGNOSIS — I119 Hypertensive heart disease without heart failure: Secondary | ICD-10-CM

## 2020-02-08 DIAGNOSIS — N401 Enlarged prostate with lower urinary tract symptoms: Secondary | ICD-10-CM

## 2020-02-08 DIAGNOSIS — N410 Acute prostatitis: Secondary | ICD-10-CM

## 2020-02-09 ENCOUNTER — Other Ambulatory Visit: Payer: Self-pay | Admitting: Internal Medicine

## 2020-02-10 ENCOUNTER — Other Ambulatory Visit: Payer: Self-pay | Admitting: Internal Medicine

## 2020-02-10 DIAGNOSIS — I119 Hypertensive heart disease without heart failure: Secondary | ICD-10-CM

## 2020-02-25 ENCOUNTER — Ambulatory Visit: Payer: Medicare Other | Attending: Internal Medicine

## 2020-02-25 DIAGNOSIS — Z23 Encounter for immunization: Secondary | ICD-10-CM

## 2020-02-25 NOTE — Progress Notes (Signed)
   Covid-19 Vaccination Clinic  Name:  Ronald Bond    MRN: [id number] DOB: 1946/08/16  02/25/2020  Mr. Blanken was observed post Covid-19 immunization for 15 minutes without incident. He was provided with Vaccine Information Sheet and instruction to access the V-Safe system.   Mr. Vanlenten was instructed to call 911 with any severe reactions post vaccine: Marland Kitchen Difficulty breathing  . Swelling of face and throat  . A fast heartbeat  . A bad rash all over body  . Dizziness and weakness   Immunizations Administered    Name Date Dose VIS Date Route   Pfizer COVID-19 Vaccine 02/25/2020  8:46 AM 0.3 mL 11/08/2019 Intramuscular   Manufacturer: [place]   Lot: [id number]   [place]: [id number]

## 2020-02-28 MED FILL — POTASSIUM CHLORIDE CRYS ER: 20 | 30 days supply | Qty: 60 | Fill #2

## 2020-03-06 DIAGNOSIS — E0789 Other specified disorders of thyroid: Secondary | ICD-10-CM | POA: Diagnosis not present

## 2020-03-09 ENCOUNTER — Other Ambulatory Visit: Payer: Self-pay | Admitting: Internal Medicine

## 2020-04-08 DIAGNOSIS — D3161 Benign neoplasm of unspecified site of right orbit: Secondary | ICD-10-CM | POA: Diagnosis not present

## 2020-04-08 DIAGNOSIS — E0500 Thyrotoxicosis with diffuse goiter without thyrotoxic crisis or storm: Secondary | ICD-10-CM | POA: Diagnosis not present

## 2020-04-08 DIAGNOSIS — H05253 Intermittent exophthalmos, bilateral: Secondary | ICD-10-CM | POA: Diagnosis not present

## 2020-04-16 MED FILL — LEVOTHYROXINE SODIUM 112 MC: 112 | 78 days supply | Qty: 90 | Fill #1

## 2020-05-08 DIAGNOSIS — E0789 Other specified disorders of thyroid: Secondary | ICD-10-CM | POA: Diagnosis not present

## 2020-05-11 DIAGNOSIS — H532 Diplopia: Secondary | ICD-10-CM | POA: Diagnosis not present

## 2020-05-11 DIAGNOSIS — H5021 Vertical strabismus, right eye: Secondary | ICD-10-CM | POA: Diagnosis not present

## 2020-05-12 ENCOUNTER — Ambulatory Visit (INDEPENDENT_AMBULATORY_CARE_PROVIDER_SITE_OTHER): Payer: Medicare Other | Admitting: Internal Medicine

## 2020-05-12 ENCOUNTER — Encounter: Payer: Self-pay | Admitting: Internal Medicine

## 2020-05-12 ENCOUNTER — Other Ambulatory Visit: Payer: Self-pay

## 2020-05-12 VITALS — BP 132/70 | HR 60 | Temp 99.2°F | Resp 16 | Ht 70.0 in | Wt 216.2 lb

## 2020-05-12 DIAGNOSIS — I493 Ventricular premature depolarization: Secondary | ICD-10-CM

## 2020-05-12 DIAGNOSIS — E118 Type 2 diabetes mellitus with unspecified complications: Secondary | ICD-10-CM

## 2020-05-12 DIAGNOSIS — E519 Thiamine deficiency, unspecified: Secondary | ICD-10-CM | POA: Diagnosis not present

## 2020-05-12 DIAGNOSIS — E876 Hypokalemia: Secondary | ICD-10-CM

## 2020-05-12 DIAGNOSIS — E890 Postprocedural hypothyroidism: Secondary | ICD-10-CM | POA: Diagnosis not present

## 2020-05-12 DIAGNOSIS — R7303 Prediabetes: Secondary | ICD-10-CM

## 2020-05-12 DIAGNOSIS — I10 Essential (primary) hypertension: Secondary | ICD-10-CM | POA: Diagnosis not present

## 2020-05-12 DIAGNOSIS — D539 Nutritional anemia, unspecified: Secondary | ICD-10-CM

## 2020-05-12 DIAGNOSIS — N1832 Chronic kidney disease, stage 3b: Secondary | ICD-10-CM | POA: Diagnosis not present

## 2020-05-12 DIAGNOSIS — T502X5A Adverse effect of carbonic-anhydrase inhibitors, benzothiadiazides and other diuretics, initial encounter: Secondary | ICD-10-CM

## 2020-05-12 LAB — CBC WITH DIFFERENTIAL/PLATELET
Basophils Absolute: 0.1 10*3/uL (ref 0.0–0.1)
Basophils Relative: 0.7 % (ref 0.0–3.0)
Eosinophils Absolute: 0.3 10*3/uL (ref 0.0–0.7)
Eosinophils Relative: 4.9 % (ref 0.0–5.0)
HCT: 34 % — ABNORMAL LOW (ref 39.0–52.0)
Hemoglobin: 11.5 g/dL — ABNORMAL LOW (ref 13.0–17.0)
Lymphocytes Relative: 16.6 % (ref 12.0–46.0)
Lymphs Abs: 1.1 10*3/uL (ref 0.7–4.0)
MCHC: 33.9 g/dL (ref 30.0–36.0)
MCV: 90.1 fl (ref 78.0–100.0)
Monocytes Absolute: 0.8 10*3/uL (ref 0.1–1.0)
Monocytes Relative: 11.1 % (ref 3.0–12.0)
Neutro Abs: 4.6 10*3/uL (ref 1.4–7.7)
Neutrophils Relative %: 66.7 % (ref 43.0–77.0)
Platelets: 255 10*3/uL (ref 150.0–400.0)
RBC: 3.78 Mil/uL — ABNORMAL LOW (ref 4.22–5.81)
RDW: 14.4 % (ref 11.5–15.5)
WBC: 6.9 10*3/uL (ref 4.0–10.5)

## 2020-05-12 LAB — BASIC METABOLIC PANEL
BUN: 16 mg/dL (ref 6–23)
CO2: 25 mEq/L (ref 19–32)
Calcium: 9.2 mg/dL (ref 8.4–10.5)
Chloride: 103 mEq/L (ref 96–112)
Creatinine, Ser: 1.37 mg/dL (ref 0.40–1.50)
GFR: 61.38 mL/min (ref >60.00)
Glucose, Bld: 105 mg/dL — ABNORMAL HIGH (ref 70–99)
Potassium: 3.4 mEq/L — ABNORMAL LOW (ref 3.5–5.1)
Sodium: 136 mEq/L (ref 135–145)

## 2020-05-12 LAB — IBC PANEL
Iron: 63 ug/dL (ref 42–165)
Saturation Ratios: 17.5 % — ABNORMAL LOW (ref 20.0–50.0)
Transferrin: 257 mg/dL (ref 212.0–360.0)

## 2020-05-12 LAB — TSH: TSH: 0.09 u[IU]/mL — ABNORMAL LOW (ref 0.35–4.50)

## 2020-05-12 LAB — HEMOGLOBIN A1C: Hgb A1c MFr Bld: 6.7 % — ABNORMAL HIGH (ref 4.6–6.5)

## 2020-05-12 LAB — FOLATE: Folate: >24.8 ng/mL (ref >5.9)

## 2020-05-12 LAB — FERRITIN: Ferritin: 108.1 ng/mL (ref 22.0–322.0)

## 2020-05-12 LAB — VITAMIN B12: Vitamin B-12: 748 pg/mL (ref 211–911)

## 2020-05-12 MED ORDER — POTASSIUM CHLORIDE CRYS ER 20 MEQ PO TBCR: 20.0000 meq | EXTENDED_RELEASE_TABLET | Freq: Two times a day (BID) | ORAL | 1 refills | Status: DC

## 2020-05-12 MED ORDER — LEVOTHYROXINE SODIUM 100 MCG PO TABS: 100.0000 ug | ORAL_TABLET | Freq: Every day | ORAL | 0 refills | Status: DC

## 2020-05-12 MED FILL — POTASSIUM CHLORIDE CRYS ER: 20 | 90 days supply | Qty: 180 | Fill #0

## 2020-05-12 NOTE — Progress Notes (Addendum)
Subjective:  Patient ID: Ronald Bond, male    DOB: [birth year]  Age: 74 y.o. MRN: [id number]  CC: Hypertension and Hypothyroidism  This visit occurred during the SARS-CoV-2 public health emergency.  Safety protocols were in place, including screening questions prior to the visit, additional usage of staff PPE, and extensive cleaning of exam room while observing appropriate contact time as indicated for disinfecting solutions.    HPI Ronald Bond presents for a pre-op clearance.  He tells me that he has developed a mass in his right eye and is having difficulty moving the eye.  He plans on having eye surgery done at the [facility] in [place].  He needs preop clearance.  He tells me he has felt well recently.  He is active and denies any recent episodes of chest pain, shortness of breath, palpitations, edema, or fatigue. Past Medical History:  Diagnosis Date  . GERD (gastroesophageal reflux disease)   . HTN (hypertension)   . Hyperlipidemia   . Hypothyroidism   . Osteoarthritis   . Wears glasses    Past Surgical History:  Procedure Laterality Date  . COLONOSCOPY    . EYE SURGERY     both cataracts  . MICROLARYNGOSCOPY Left 11/18/2013   Procedure: MICROLARYNGOSCOPY WITH REMOVAL OF GRANULOMA LEFT SIDE;  Surgeon: Izora Gala, MD;  Location: [place];  Service: ENT;  Laterality: Left;  . NM MYOVIEW LTD  06/2007   Low risk, normal.  No evidence of ischemia or infarction  . THYROIDECTOMY  1974  . TONSILLECTOMY    . TOTAL KNEE ARTHROPLASTY  2009   left    reports that he has never smoked. He has never used smokeless tobacco. He reports current alcohol use. He reports that he does not use drugs. family history includes Brain cancer in his mother; Heart disease in his brother and father. Allergies  Allergen Reactions  . Lisinopril Other (See Comments)    Edema in face    Outpatient Medications Prior to Visit  Medication Sig Dispense Refill  .  amLODipine (NORVASC) 10 MG tablet TAKE 1 TABLET BY MOUTH  DAILY 90 tablet 1  . aspirin 81 MG EC tablet Take 81 mg by mouth daily.      Marland Kitchen atorvastatin (LIPITOR) 40 MG tablet Take 1 tablet (40 mg total) by mouth daily. 90 tablet 1  . fluticasone (FLONASE) 50 MCG/ACT nasal spray Place 2 sprays into both nostrils daily. 48 g 1  . indapamide (LOZOL) 1.25 MG tablet TAKE 1 TABLET (1.25 MG TOTAL) BY MOUTH DAILY. 90 tablet 0  . losartan (COZAAR) 100 MG tablet TAKE 1 TABLET BY MOUTH  DAILY 90 tablet 1  . mometasone-formoterol (DULERA) 100-5 MCG/ACT AERO Inhale 2 puffs into the lungs 2 (two) times daily. 13 g 11  . Multiple Vitamin (MULTIVITAMIN) tablet Take 1 tablet by mouth daily.    . tamsulosin (FLOMAX) 0.4 MG CAPS capsule TAKE 1 CAPSULE BY MOUTH  DAILY AFTER SUPPER 90 capsule 1  . hydrochlorothiazide (HYDRODIURIL) 12.5 MG tablet TAKE 1 TABLET BY MOUTH  DAILY 90 tablet 0  . levothyroxine (SYNTHROID) 112 MCG tablet Take 1 tablet (112 mcg total) by mouth as directed. 8x a week 90 tablet 1  . potassium chloride SA (KLOR-CON) 20 MEQ tablet Take 1 tablet (20 mEq total) by mouth 2 (two) times daily. 180 tablet 1   No facility-administered medications prior to visit.    ROS Review of Systems  Constitutional: Negative.  Negative for appetite  change, diaphoresis, fatigue and unexpected weight change.  HENT: Negative.   Eyes: Positive for visual disturbance. Negative for photophobia and pain.  Respiratory: Negative for cough, chest tightness, shortness of breath and wheezing.   Cardiovascular: Negative for chest pain, palpitations and leg swelling.  Gastrointestinal: Negative for abdominal pain, constipation, diarrhea, nausea and vomiting.  Endocrine: Negative.  Negative for cold intolerance and heat intolerance.  Genitourinary: Negative.  Negative for difficulty urinating.  Musculoskeletal: Negative for arthralgias, myalgias and neck pain.  Skin: Negative.  Negative for color change and pallor.    Neurological: Negative.  Negative for dizziness, weakness, light-headedness and numbness.  Hematological: Negative for adenopathy. Does not bruise/bleed easily.  Psychiatric/Behavioral: Negative.  Negative for agitation and dysphoric mood. The patient is not nervous/anxious.     Objective:  BP 132/70 (BP Location: Left Arm, Patient Position: Sitting, Cuff Size: Large)   Pulse 60   Temp 99.2 F (37.3 C) (Oral)   Resp 16   Ht 5\' 10"  (1.778 m)   Wt 216 lb 4 oz (98.1 kg)   SpO2 97%   BMI 31.03 kg/m   BP Readings from Last 3 Encounters:  05/12/20 132/70  01/10/20 (!) 146/78  12/11/19 132/80    Wt Readings from Last 3 Encounters:  05/12/20 216 lb 4 oz (98.1 kg)  01/10/20 226 lb 6.4 oz (102.7 kg)  12/11/19 227 lb 3.2 oz (103.1 kg)    Physical Exam Vitals reviewed.  Constitutional:      Appearance: Normal appearance.  HENT:     Nose: Nose normal.     Mouth/Throat:     Mouth: Mucous membranes are moist.  Eyes:     General: No scleral icterus.    Extraocular Movements:     Right eye: Abnormal extraocular motion present.     Left eye: Normal extraocular motion and no nystagmus.     Conjunctiva/sclera: Conjunctivae normal.     Comments: Unable to abduct the right eye  Cardiovascular:     Rate and Rhythm: Normal rate and regular rhythm.     Heart sounds: Normal heart sounds, S1 normal and S2 normal. No murmur heard.  No gallop.      Comments: EKG - NSR, 60 bpm Normal EKG Pulmonary:     Effort: Pulmonary effort is normal.     Breath sounds: No stridor. No wheezing, rhonchi or rales.  Abdominal:     General: Abdomen is flat. Bowel sounds are normal. There is no distension.     Palpations: Abdomen is soft. There is no hepatomegaly, splenomegaly or mass.     Tenderness: There is no abdominal tenderness.  Musculoskeletal:        General: No swelling. Normal range of motion.     Cervical back: Neck supple.     Right lower leg: No edema.     Left lower leg: No edema.   Lymphadenopathy:     Cervical: No cervical adenopathy.  Skin:    General: Skin is warm and dry.     Coloration: Skin is not pale.  Neurological:     General: No focal deficit present.     Mental Status: He is alert and oriented to person, place, and time. Mental status is at baseline.     Lab Results  Component Value Date   WBC 6.9 05/12/2020   HGB 11.5 (L) 05/12/2020   HCT 34.0 (L) 05/12/2020   PLT 255.0 05/12/2020   GLUCOSE 105 (H) 05/12/2020   CHOL 163 12/11/2019   TRIG  134.0 12/11/2019   HDL 55.40 12/11/2019   LDLDIRECT 75.0 03/08/2016   LDLCALC 81 12/11/2019   ALT 20 10/15/2018   AST 19 10/15/2018   NA 136 05/12/2020   K 3.4 (L) 05/12/2020   CL 103 05/12/2020   CREATININE 1.37 05/12/2020   BUN 16 05/12/2020   CO2 25 05/12/2020   TSH 0.09 (L) 05/12/2020   PSA 2.60 12/11/2019   INR 0.9 10/17/2007   HGBA1C 6.7 (H) 05/12/2020    CT CORONARY FRACTIONAL FLOW RESERVE DATA PREP  Result Date: 06/05/2019 CLINICAL DATA:  Chest pain EXAM: CT FFR MEDICATIONS: No additional medications TECHNIQUE: The coronary CT was sent for FFR. FINDINGS: No modeled stenosis > 30%. IMPRESSION: No evidence for hemodynamically significant coronary disease. Dalton Mclean Electronically Signed   By: Loralie Champagne M.D.   On: 05/28/2019 16:33   CT CORONARY FRACTIONAL FLOW RESERVE FLUID ANALYSIS  Result Date: 06/05/2019 CLINICAL DATA:  Chest pain EXAM: CT FFR MEDICATIONS: No additional medications TECHNIQUE: The coronary CT was sent for FFR. FINDINGS: No modeled stenosis > 30%. IMPRESSION: No evidence for hemodynamically significant coronary disease. Dalton Mclean Electronically Signed   By: Loralie Champagne M.D.   On: 05/28/2019 16:33    Assessment & Plan:   Ronald Bond was seen today for hypertension and hypothyroidism.  Diagnoses and all orders for this visit:  Postablative hypothyroidism- His TSH is suppressed. The T4 dose has been lowered. -     TSH; Future -     TSH -     Discontinue:  levothyroxine (SYNTHROID) 100 MCG tablet; Take 1 tablet (100 mcg total) by mouth daily.  Stage 3b chronic kidney disease- His renal function is stable. -     Basic metabolic panel; Future -     Basic metabolic panel  Prediabetes- His A1C is up to 6.7% -     Hemoglobin A1c; Future -     Hemoglobin A1c  Thiamine deficiency- He has a persistent anemia. Will monitor the B1 level. -     CBC with Differential/Platelet; Future -     Vitamin B1; Future -     Vitamin B1 -     CBC with Differential/Platelet  Essential hypertension, benign- His BP is well controlled -     EKG 12-Lead -     Basic metabolic panel; Future -     Basic metabolic panel -     potassium chloride SA (KLOR-CON) 20 MEQ tablet; Take 1 tablet (20 mEq total) by mouth 2 (two) times daily.  Frequent PVCs- This has resolved. Surgical clearance form completed. -     EKG 12-Lead  Deficiency anemia- His H/H remain low. Will monitor for vitamin deficiencies. -     CBC with Differential/Platelet; Future -     Vitamin B12; Future -     IBC panel; Future -     Folate; Future -     Ferritin; Future -     Reticulocytes; Future -     Reticulocytes -     Ferritin -     Folate -     IBC panel -     Vitamin B12 -     CBC with Differential/Platelet  Diuretic-induced hypokalemia -     potassium chloride SA (KLOR-CON) 20 MEQ tablet; Take 1 tablet (20 mEq total) by mouth 2 (two) times daily.  Other orders -     levothyroxine (SYNTHROID) 112 MCG tablet; Take 1 tablet (112 mcg total) by mouth daily.   I have discontinued  [name] "Westen Laguardia"'s levothyroxine, hydrochlorothiazide, and levothyroxine. I am also having him start on levothyroxine. Additionally, I am having him maintain his aspirin, mometasone-formoterol, multivitamin, fluticasone, atorvastatin, losartan, tamsulosin, amLODipine, indapamide, and potassium chloride SA.  Meds ordered this encounter  Medications  . DISCONTD: levothyroxine (SYNTHROID) 100 MCG  tablet    Sig: Take 1 tablet (100 mcg total) by mouth daily.    Dispense:  90 tablet    Refill:  0  . potassium chloride SA (KLOR-CON) 20 MEQ tablet    Sig: Take 1 tablet (20 mEq total) by mouth 2 (two) times daily.    Dispense:  180 tablet    Refill:  1  . levothyroxine (SYNTHROID) 112 MCG tablet    Sig: Take 1 tablet (112 mcg total) by mouth daily.    Dispense:  90 tablet    Refill:  1    To replace the 100 mcg prescription   I spent 60 minutes in preparing to see the patient by review of recent labs, imaging and procedures, obtaining and reviewing separately obtained history, communicating with the patient and family or caregiver, ordering medications, tests or procedures, and documenting clinical information in the EHR including the differential Dx, treatment, and any further evaluation and other management of 1. Postablative hypothyroidism 2. Stage 3b chronic kidney disease 3. Prediabetes 4. Thiamine deficiency 5. Essential hypertension, benign 6. Frequent PVCs 7. Deficiency anemia 8. Diuretic-induced hypokalemia 9. Type II diabetes mellitus with manifestations ([place])     Follow-up: Return in about 3 months (around 08/12/2020).  Scarlette Calico, MD

## 2020-05-12 NOTE — Patient Instructions (Signed)
Anemia  Anemia is a condition in which you do not have enough red blood cells or hemoglobin. Hemoglobin is a substance in red blood cells that carries oxygen. When you do not have enough red blood cells or hemoglobin (are anemic), your body cannot get enough oxygen and your organs may not work properly. As a result, you may feel very tired or have other problems. What are the causes? Common causes of anemia include:  Excessive bleeding. Anemia can be caused by excessive bleeding inside or outside the body, including bleeding from the intestine or from periods in women.  Poor nutrition.  Long-lasting (chronic) kidney, thyroid, and liver disease.  Bone marrow disorders.  Cancer and treatments for cancer.  HIV (human immunodeficiency virus) and AIDS (acquired immunodeficiency syndrome).  Treatments for HIV and AIDS.  Spleen problems.  Blood disorders.  Infections, medicines, and autoimmune disorders that destroy red blood cells. What are the signs or symptoms? Symptoms of this condition include:  Minor weakness.  Dizziness.  Headache.  Feeling heartbeats that are irregular or faster than normal (palpitations).  Shortness of breath, especially with exercise.  Paleness.  Cold sensitivity.  Indigestion.  Nausea.  Difficulty sleeping.  Difficulty concentrating. Symptoms may occur suddenly or develop slowly. If your anemia is mild, you may not have symptoms. How is this diagnosed? This condition is diagnosed based on:  Blood tests.  Your medical history.  A physical exam.  Bone marrow biopsy. Your health care provider may also check your stool (feces) for blood and may do additional testing to look for the cause of your bleeding. You may also have other tests, including:  Imaging tests, such as a CT scan or MRI.  Endoscopy.  Colonoscopy. How is this treated? Treatment for this condition depends on the cause. If you continue to lose a lot of blood, you may  need to be treated at a hospital. Treatment may include:  Taking supplements of iron, vitamin M08, or folic acid.  Taking a hormone medicine (erythropoietin) that can help to stimulate red blood cell growth.  Having a blood transfusion. This may be needed if you lose a lot of blood.  Making changes to your diet.  Having surgery to remove your spleen. Follow these instructions at home:  Take over-the-counter and prescription medicines only as told by your health care provider.  Take supplements only as told by your health care provider.  Follow any diet instructions that you were given.  Keep all follow-up visits as told by your health care provider. This is important. Contact a health care provider if:  You develop new bleeding anywhere in the body. Get help right away if:  You are very weak.  You are short of breath.  You have pain in your abdomen or chest.  You are dizzy or feel faint.  You have trouble concentrating.  You have bloody or black, tarry stools.  You vomit repeatedly or you vomit up blood. Summary  Anemia is a condition in which you do not have enough red blood cells or enough of a substance in your red blood cells that carries oxygen (hemoglobin).  Symptoms may occur suddenly or develop slowly.  If your anemia is mild, you may not have symptoms.  This condition is diagnosed with blood tests as well as a medical history and physical exam. Other tests may be needed.  Treatment for this condition depends on the cause of the anemia. This information is not intended to replace advice given to you by  your health care provider. Make sure you discuss any questions you have with your health care provider. Document Revised: 10/27/2017 Document Reviewed: 12/16/2016 Elsevier Patient Education  [place].

## 2020-05-13 ENCOUNTER — Telehealth: Payer: Self-pay | Admitting: Internal Medicine

## 2020-05-13 ENCOUNTER — Telehealth: Payer: Self-pay

## 2020-05-13 DIAGNOSIS — E118 Type 2 diabetes mellitus with unspecified complications: Secondary | ICD-10-CM | POA: Insufficient documentation

## 2020-05-13 DIAGNOSIS — E890 Postprocedural hypothyroidism: Secondary | ICD-10-CM

## 2020-05-13 MED ORDER — LEVOTHYROXINE SODIUM 112 MCG PO TABS
112.0000 ug | ORAL_TABLET | Freq: Every day | ORAL | 1 refills | Status: DC
Start: 2020-05-13 — End: 2020-07-13

## 2020-05-13 NOTE — Telephone Encounter (Signed)
Yes, I am ok with this. Thank you for prescribing his T4 dose.  TJ

## 2020-05-13 NOTE — Telephone Encounter (Signed)
Discussed TFT results with the pt on 6/16 at 7:30 AM.     Currently on levothyroxine 112 mcg 2 tabs on sundays and 1 tab rest of the week. ( average 128 mcg daily )   TSH low at 0.09 uIU.   Will reduce to 112 mcg daily from now on , I have reminded him to use the "pill box" and to double up the dose the next day if he forgets to take it .   Pt will have labs in 8 weeks   Pt expressed understanding   Abby Nena Jordan, MD  [facility] End[facility]  [facility] [address] [place], [place] [address] Phone: [phone] FAX: [phone]

## 2020-05-13 NOTE — Telephone Encounter (Signed)
Patient calling and states that he received a phone call from [facility]. They are requesting that the form that he dropped off to Dr Ronnald Ramp to have filled out, be faxed to them so they can schedule surgery. Fax#: [phone] Attn Elmyra Ricks

## 2020-05-13 NOTE — Telephone Encounter (Signed)
-----   Message from [name], MD sent at 05/13/2020  7:47 AM EDT ----- Regarding: Thyroid Good Morning Dr. Ronnald Ramp,    I am reaching out to you to discuss Ronald Bond recent thyroid test results.  He is currently on Levothyroxine 112 mcg , taking two tablets on sundays and one tablet daily the rest of the week which is averaging 128 mcg daily.  When he was on 112 mcg daily, it seems it was not enough with a TSH in the 9's, but I suspect this is because he was missing some doses.   I would recommend reducing his dose down to 112 mcg daily including sundays and waiting to see how that works before switching him to 100 mcg ( just to prevent large fluctuations in his TSH )    Pt in agreement of this and I did talk to him about using the "pill box" and doubling up on his dose if he misses it one day.   Pt did admit to missing doses in the past.    Let me know if you are ok with this and I will send a new prescription of 112 mcg daily     Thanks    Abby    Abby Nena Jordan, MD  [facility] Endocrin[facility]  [facility] [address] [place], [place] [address] Phone: [phone] FAX: [phone]

## 2020-05-14 ENCOUNTER — Telehealth: Payer: Self-pay | Admitting: Internal Medicine

## 2020-05-14 NOTE — Telephone Encounter (Signed)
Can you call their office and let them know that the EKG went to our records department to be scanned to the patients chart.

## 2020-05-14 NOTE — Telephone Encounter (Signed)
Ronald Bond with Dr. Thera Flake office called and said they had received the surgical clearance for him but they were needing an EKG, their fax number is [phone].

## 2020-05-14 NOTE — Telephone Encounter (Signed)
Called the office.

## 2020-05-15 ENCOUNTER — Other Ambulatory Visit: Payer: Self-pay | Admitting: Internal Medicine

## 2020-05-15 DIAGNOSIS — I119 Hypertensive heart disease without heart failure: Secondary | ICD-10-CM

## 2020-05-15 LAB — VITAMIN B1: Vitamin B1 (Thiamine): 11 nmol/L (ref 8–30)

## 2020-05-15 LAB — RETICULOCYTES
ABS Retic: 47760 cells/uL (ref 25000–9000)
Retic Ct Pct: 1.2 %

## 2020-05-19 DIAGNOSIS — E785 Hyperlipidemia, unspecified: Secondary | ICD-10-CM | POA: Diagnosis not present

## 2020-05-19 DIAGNOSIS — E1122 Type 2 diabetes mellitus with diabetic chronic kidney disease: Secondary | ICD-10-CM | POA: Diagnosis not present

## 2020-05-19 DIAGNOSIS — E039 Hypothyroidism, unspecified: Secondary | ICD-10-CM | POA: Diagnosis not present

## 2020-05-19 DIAGNOSIS — H5021 Vertical strabismus, right eye: Secondary | ICD-10-CM | POA: Diagnosis not present

## 2020-05-19 DIAGNOSIS — N183 Chronic kidney disease, stage 3 unspecified: Secondary | ICD-10-CM | POA: Diagnosis not present

## 2020-05-19 DIAGNOSIS — H509 Unspecified strabismus: Secondary | ICD-10-CM | POA: Diagnosis not present

## 2020-05-28 NOTE — Telephone Encounter (Signed)
Reviewed chart and EKG has been scanned to pt chart.  EKG printed and faxed to [facility] so eye surgery can be scheduled.

## 2020-05-29 ENCOUNTER — Other Ambulatory Visit: Payer: Self-pay | Admitting: Internal Medicine

## 2020-05-29 NOTE — Telephone Encounter (Signed)
Called pt and informed pt of same. Pt stated that he has been scheduled for his eye surgery.

## 2020-06-13 ENCOUNTER — Other Ambulatory Visit: Payer: Self-pay | Admitting: Internal Medicine

## 2020-06-13 DIAGNOSIS — E785 Hyperlipidemia, unspecified: Secondary | ICD-10-CM

## 2020-06-13 MED FILL — ATORVASTATIN CALCIUM 40 MG: 40 | 90 days supply | Qty: 90 | Fill #0

## 2020-06-27 MED FILL — LEVOTHYROXINE SODIUM 112 MC: 112 | 90 days supply | Qty: 90 | Fill #0

## 2020-06-29 ENCOUNTER — Telehealth: Payer: Self-pay | Admitting: Internal Medicine

## 2020-06-29 NOTE — Telephone Encounter (Signed)
   Patient requesting refill on Carvedilol, not listed on med list Should patient still be taking?  If so, please refill to [facility], [place] [address]

## 2020-06-30 ENCOUNTER — Telehealth: Payer: Self-pay | Admitting: Cardiology

## 2020-06-30 ENCOUNTER — Other Ambulatory Visit: Payer: Self-pay | Admitting: Internal Medicine

## 2020-06-30 NOTE — Telephone Encounter (Signed)
Called pt there was no answer LMOM w/MD response../lmb 

## 2020-06-30 NOTE — Telephone Encounter (Signed)
New message   *STAT* If patient is at the pharmacy, call can be transferred to refill team.   1. Which medications need to be refilled? (please list name of each medication and dose if known) Carvedilol twice a day   2. Which pharmacy/location (including street and city if local pharmacy) is medication to be sent to? [place], [place]  3. Do they need a 30 day or 90 day supply? Short supply

## 2020-07-01 NOTE — Telephone Encounter (Signed)
Patient requesting refill on Carvedilol I do not see this  medication on patients current medication list.

## 2020-07-02 NOTE — Telephone Encounter (Signed)
Left message for patient to call back .   reviewing patient's  Chart. Patient saw primary in Jan 2021, during that visit  Medication was  Discontinue  Reason  Patient preference.  RN will need to ask patient if he has been taking medication  Since the visit in Jan 2021 with Dr Ronnald Ramp before medication be refilled.

## 2020-07-03 MED ORDER — CARVEDILOL 3.125 MG PO TABS
3.1250 mg | ORAL_TABLET | Freq: Two times a day (BID) | ORAL | 0 refills | Status: DC
Start: 2020-07-03 — End: 2020-12-21

## 2020-07-03 MED ORDER — CARVEDILOL 3.125 MG PO TABS
3.1250 mg | ORAL_TABLET | Freq: Two times a day (BID) | ORAL | 3 refills | Status: DC
Start: 2020-07-03 — End: 2020-07-13

## 2020-07-03 MED FILL — CARVEDILOL 3.125 MG TABLET: 3.125 | 30 days supply | Qty: 60 | Fill #0

## 2020-07-03 NOTE — Telephone Encounter (Signed)
Spoke to patient . Patient states he has not discontinue medication  Coreg 3.125  Mg twice a day  he has been taking medication Coreg 3.125 mg  Twice a day since Jan 2021 office appointment  until it ran out a few days go .   Short Term  Prescription for  1 month supply   Sent to  Local pharmacy and  90 day supply sent  mail order.  patient verbalized understadning

## 2020-07-09 ENCOUNTER — Ambulatory Visit: Payer: Medicare Other | Admitting: Internal Medicine

## 2020-07-13 ENCOUNTER — Other Ambulatory Visit: Payer: Self-pay

## 2020-07-13 ENCOUNTER — Other Ambulatory Visit: Payer: Self-pay | Admitting: Internal Medicine

## 2020-07-13 ENCOUNTER — Encounter: Payer: Self-pay | Admitting: Internal Medicine

## 2020-07-13 ENCOUNTER — Ambulatory Visit: Payer: Medicare Other | Admitting: Internal Medicine

## 2020-07-13 VITALS — BP 144/80 | HR 50 | Ht 70.0 in | Wt 216.8 lb

## 2020-07-13 DIAGNOSIS — E890 Postprocedural hypothyroidism: Secondary | ICD-10-CM | POA: Diagnosis not present

## 2020-07-13 DIAGNOSIS — E0500 Thyrotoxicosis with diffuse goiter without thyrotoxic crisis or storm: Secondary | ICD-10-CM | POA: Diagnosis not present

## 2020-07-13 LAB — TSH: TSH: 2.16 u[IU]/mL (ref 0.35–4.50)

## 2020-07-13 LAB — T4, FREE: Free T4: 1.14 ng/dL (ref 0.60–1.60)

## 2020-07-13 MED ORDER — LEVOTHYROXINE SODIUM 112 MCG PO TABS: 112.0000 ug | ORAL_TABLET | Freq: Every day | ORAL | 1 refills | Status: DC

## 2020-07-13 NOTE — Patient Instructions (Signed)

## 2020-07-13 NOTE — Progress Notes (Signed)
Name: Ronald Bond  MRN/ DOB: [id number], [birth year]    Age/ Sex: 74 y.o., male     PCP: Ronald Lima, MD   Reason for Endocrinology Evaluation:  Ronald Bond' disease     Initial Endocrinology Clinic Visit: 10/07/2019    PATIENT IDENTIFIER: Ronald Bond is a 74 y.o., male with a past medical history of Graves' disease, HTN, dyslipidemia and CHF. He has followed with [facility] since 10/07/2019 for consultative assistance with management of his Graves' disease  HISTORICAL SUMMARY: The patient was first diagnosed with hyperthyroidism 1974 , requiring total thyroidectomy with benign pathology, but a couple of years later the pt was noted with hyperthyroidism again due tissue regrowth, requiring  RAI ablation , and was on LT-4 replacement until 07/2019 when this was stopped due to a low TSH 0.12 uIU/mL   But the patient restarted his L T4 replacement weeks later due to palpitation and abnormal sensation.  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  He was treated with prednisone, without any improvement in his vision.  He was unable to obtain the [place] due to shortage per patient.  He is S/P radiation therapy x3  in [place]  In 2021   He normally sees Dr. Katy Bond locally and has an appointment to see him today   Mother with graves' disease    SUBJECTIVE:   Today (07/13/2020):  Ronald Bond is here for follow-up on Graves' disease.      He completed radiation therapy for Grave's orbitopathy in [place]. Did notice any improvement. Pending MRI. S/P right eye sx  05/2020 which have improved diplopia  But continues with tearing.      Weight has been stable  Denies constipation   Has been taking Levothyroxine 112 mcg daily regularly, denies missing any doses.     ROS:  As per HPI.   HISTORY:  Past Medical History:  Past Medical History:  Diagnosis Date  . GERD (gastroesophageal reflux disease)   . HTN (hypertension)   . Hyperlipidemia   . Hypothyroidism   . Osteoarthritis   . Wears glasses    Past Surgical History:  Past Surgical History:  Procedure Laterality Date  . COLONOSCOPY    . EYE SURGERY     both cataracts  . MICROLARYNGOSCOPY Left 11/18/2013   Procedure: MICROLARYNGOSCOPY WITH REMOVAL OF GRANULOMA LEFT SIDE;  Surgeon: Ronald Gala, MD;  Location: [facility];  Service: ENT;  Laterality: Left;  . NM MYOVIEW LTD  06/2007   Low risk, normal.  No evidence of ischemia or infarction  . THYROIDECTOMY  1974  . TONSILLECTOMY    . TOTAL KNEE ARTHROPLASTY  2009   left    Social History:  reports that he has never smoked. He has never used smokeless tobacco. He reports current alcohol use. He reports that he does not use drugs. Family History:  Family History  Problem Relation Age of Onset  . Brain cancer Mother   . Heart disease Father   . Heart disease Brother      HOME MEDICATIONS: Allergies as of 07/13/2020  Reactions   Lisinopril Other (See Comments)   Edema in face      Medication List       Accurate as of July 13, 2020 12:41 PM. If you have any questions, ask your nurse or doctor.        STOP taking these medications   hydrochlorothiazide 12.5 MG tablet Commonly known as: HYDRODIURIL Stopped by: Ronald Sciara, MD     TAKE these medications   amLODipine 10 MG tablet Commonly known as: NORVASC TAKE 1 TABLET BY MOUTH  DAILY   aspirin 81 MG EC tablet Take 81 mg by mouth daily.   atorvastatin 40 MG tablet Commonly known as: LIPITOR TAKE 1 TABLET (40 MG TOTAL) BY MOUTH  DAILY.   carvedilol 3.125 MG tablet Commonly known as: COREG Take 1 tablet (3.125 mg total) by mouth 2 (two) times daily. Until medication come from  Mail order   fluticasone 50 MCG/ACT nasal spray Commonly known as: FLONASE Place 2 sprays into both nostrils daily.   indapamide 1.25 MG tablet Commonly known as: LOZOL TAKE 1 TABLET (1.25 MG TOTAL) BY MOUTH DAILY.   levothyroxine 112 MCG tablet Commonly known as: SYNTHROID Take 1 tablet (112 mcg total) by mouth daily.   losartan 100 MG tablet Commonly known as: COZAAR TAKE 1 TABLET BY MOUTH  DAILY   mometasone-formoterol 100-5 MCG/ACT Aero Commonly known as: DULERA Inhale 2 puffs into the lungs 2 (two) times daily. What changed:   when to take this  reasons to take this   multivitamin tablet Take 1 tablet by mouth daily.   potassium chloride SA 20 MEQ tablet Commonly known as: KLOR-CON Take 1 tablet (20 mEq total) by mouth 2 (two) times daily.   tamsulosin 0.4 MG Caps capsule Commonly known as: FLOMAX TAKE 1 CAPSULE BY MOUTH  DAILY AFTER SUPPER         OBJECTIVE:   PHYSICAL EXAM: VS: BP (!) 144/80 (BP Location: Left Arm, Patient Position: Sitting, Cuff Size: Normal)   Pulse (!) 50   Ht 5\' 10"  (1.778 m)   Wt 216 lb 12.8 oz (98.3 kg)   SpO2 97%   BMI 31.11 kg/m    EXAM: General: Pt appears well and is in NAD  Eyes: External eye exam sows bilateral proptosis and chemosis R>L , with disconjugate movement of the eyes   Neck: General: Supple without adenopathy. Thyroid: Thyroid size normal.  No goiter or nodules appreciated. No thyroid bruit.  Lungs: Clear with good BS bilat with no rales, rhonchi, or wheezes  Heart: Auscultation: RRR.  Extremities:  BL LE: No pretibial edema normal ROM and strength.  Mental Status: Judgment, insight: Intact Memory: Intact for recent and remote events Mood and affect: No depression, anxiety, or agitation     DATA REVIEWED: Results for Ronald, Bond" (MRN [id number]) as of 07/13/2020 12:41  Ref. Range 07/13/2020 09:51  TSH Latest Ref Range: 0.35 - 4.50 uIU/mL 2.16  T4,Free(Direct) Latest Ref Range: 0.60 - 1.60 ng/dL 1.14    07/2019 TSI : 474 (H)     ASSESSMENT / PLAN / RECOMMENDATIONS:   1. Graves' disease, status post thyroidectomy followed by radioactive iodine ablation:  -The patient is clinically euthyroid - No local neck symptoms  - Pt educated extensively on the correct way to take levothyroxine (first thing in the morning with water, 30 minutes before eating or taking other medications). - Pt encouraged to double dose the following day if she were to miss  a dose given long half-life of levothyroxine.     Medications   Continue Levothyroxine 112 MCG daily  2. Graves' Orbitopathy :   - S/P radiation therapy, prednisone therapy and recently sx on the right eye - Diplopia resolved , but continues with tearing.  - We did discuss that graves' disease could attack the eyes even with euthyroid status and he needs to continue to be under the care of ophthalmology.   Follow-up in 6 months       Signed electronically by: Mack Guise, MD  [facility] Endocrinology  [facility] [address] [place], [place] [address] Phone: [phone] FAX: [phone]      CC: Ronald Lima, MD [place] [address] Phone: [phone]  Fax: [phone]   Return to Endocrinology clinic as below: Future Appointments  Date Time Provider [place]  01/14/2021  8:50 AM Baby Stairs, Melanie Crazier, MD [facility] None

## 2020-07-14 ENCOUNTER — Other Ambulatory Visit: Payer: Self-pay | Admitting: Internal Medicine

## 2020-07-14 DIAGNOSIS — I10 Essential (primary) hypertension: Secondary | ICD-10-CM

## 2020-07-14 DIAGNOSIS — I119 Hypertensive heart disease without heart failure: Secondary | ICD-10-CM

## 2020-07-23 ENCOUNTER — Other Ambulatory Visit: Payer: Self-pay | Admitting: Internal Medicine

## 2020-07-23 DIAGNOSIS — I119 Hypertensive heart disease without heart failure: Secondary | ICD-10-CM

## 2020-07-23 DIAGNOSIS — N401 Enlarged prostate with lower urinary tract symptoms: Secondary | ICD-10-CM

## 2020-07-23 DIAGNOSIS — N410 Acute prostatitis: Secondary | ICD-10-CM

## 2020-08-03 ENCOUNTER — Telehealth: Payer: Self-pay

## 2020-08-03 NOTE — Telephone Encounter (Signed)
LVM for pt to RTM my call to R/S AWV with [facility]

## 2020-08-15 ENCOUNTER — Other Ambulatory Visit: Payer: Self-pay | Admitting: Internal Medicine

## 2020-08-15 DIAGNOSIS — I119 Hypertensive heart disease without heart failure: Secondary | ICD-10-CM

## 2020-08-18 ENCOUNTER — Ambulatory Visit: Payer: Medicare Other

## 2020-08-26 ENCOUNTER — Other Ambulatory Visit: Payer: Self-pay | Admitting: Internal Medicine

## 2020-08-26 DIAGNOSIS — I119 Hypertensive heart disease without heart failure: Secondary | ICD-10-CM

## 2020-08-26 MED ORDER — INDAPAMIDE 1.25 MG PO TABS: 1.2500 mg | ORAL_TABLET | Freq: Every day | ORAL | 0 refills | Status: DC

## 2020-09-02 ENCOUNTER — Ambulatory Visit: Payer: Medicare Other

## 2020-09-07 ENCOUNTER — Telehealth: Payer: Self-pay | Admitting: Internal Medicine

## 2020-09-07 NOTE — Progress Notes (Signed)
  Chronic Care Management   Outreach Note  09/07/2020 Name: PAO HAFFEY MRN: [id number] DOB: 14-Jan-1946  Referred by: Janith Lima, MD Reason for referral : No chief complaint on file.   An unsuccessful telephone outreach was attempted today. The patient was referred to the pharmacist for assistance with care management and care coordination.   Follow Up Plan:   Carley Perdue [employer] Scheduler

## 2020-09-14 ENCOUNTER — Telehealth: Payer: Self-pay | Admitting: Internal Medicine

## 2020-09-14 NOTE — Progress Notes (Signed)
  Chronic Care Management   Outreach Note  09/14/2020 Name: Ronald Bond MRN: [id number] DOB: 09-21-1946  Referred by: Janith Lima, MD Reason for referral : No chief complaint on file.   A second unsuccessful telephone outreach was attempted today. The patient was referred to pharmacist for assistance with care management and care coordination.  Follow Up Plan:   Carley Perdue [employer] Scheduler

## 2020-09-18 ENCOUNTER — Ambulatory Visit (INDEPENDENT_AMBULATORY_CARE_PROVIDER_SITE_OTHER): Payer: Medicare Other

## 2020-09-18 ENCOUNTER — Other Ambulatory Visit: Payer: Self-pay

## 2020-09-18 ENCOUNTER — Other Ambulatory Visit (HOSPITAL_BASED_OUTPATIENT_CLINIC_OR_DEPARTMENT_OTHER): Payer: Self-pay | Admitting: Optometry

## 2020-09-18 VITALS — BP 130/80 | HR 56 | Temp 98.3°F | Resp 16 | Ht 70.0 in | Wt 214.8 lb

## 2020-09-18 DIAGNOSIS — H0102B Squamous blepharitis left eye, upper and lower eyelids: Secondary | ICD-10-CM | POA: Diagnosis not present

## 2020-09-18 DIAGNOSIS — H16223 Keratoconjunctivitis sicca, not specified as Sjogren's, bilateral: Secondary | ICD-10-CM | POA: Diagnosis not present

## 2020-09-18 DIAGNOSIS — E0500 Thyrotoxicosis with diffuse goiter without thyrotoxic crisis or storm: Secondary | ICD-10-CM | POA: Diagnosis not present

## 2020-09-18 DIAGNOSIS — Z0000 Encounter for general adult medical examination without abnormal findings: Secondary | ICD-10-CM

## 2020-09-18 DIAGNOSIS — H0102A Squamous blepharitis right eye, upper and lower eyelids: Secondary | ICD-10-CM | POA: Diagnosis not present

## 2020-09-18 DIAGNOSIS — H0520 Unspecified exophthalmos: Secondary | ICD-10-CM | POA: Diagnosis not present

## 2020-09-18 MED FILL — ERYTHROMYCIN EYE OINTMENT: 5 | 7 days supply | Qty: 4 | Fill #0

## 2020-09-18 NOTE — Patient Instructions (Signed)
Ronald Bond , Thank you for taking time to come for your Medicare Wellness Visit. I appreciate your ongoing commitment to your health goals. Please review the following plan we discussed and let me know if I can assist you in the future.   Screening recommendations/referrals: Colonoscopy: 03/02/2018; due every 5 years (02/2023) Recommended yearly ophthalmology/optometry visit for glaucoma screening and checkup Recommended yearly dental visit for hygiene and checkup  Vaccinations: Influenza vaccine: 07/27/2019 Pneumococcal vaccine: up to date Tdap vaccine: 08/25/2015 Shingles vaccine: 08/23/2019; need second dose   Covid-19: up to date  Advanced directives: Advance directive discussed with you today. Even though you declined this today please call our office should you change your mind and we can give you the proper paperwork for you to fill out.  Conditions/risks identified: Yes; Reviewed health maintenance screenings with patient today and relevant education, vaccines, and/or referrals were provided. Please continue to do your personal lifestyle choices by: daily care of teeth and gums, regular physical activity (goal should be 5 days a week for 30 minutes), eat a healthy diet, avoid tobacco and drug use, limiting any alcohol intake, taking a low-dose aspirin (if not allergic or have been advised by your provider otherwise) and taking vitamins and minerals as recommended by your provider. Continue doing brain stimulating activities (puzzles, reading, adult coloring books, staying active) to keep memory sharp. Continue to eat heart healthy diet (full of fruits, vegetables, whole grains, lean protein, water--limit salt, fat, and sugar intake) and increase physical activity as tolerated.  Next appointment: Please schedule your next Medicare Wellness Visit with your Nurse Health Advisor in 1 year by calling [phone]. Preventive Care 20 Years and Older, Male Preventive care refers to lifestyle  choices and visits with your health care provider that can promote health and wellness. What does preventive care include?  A yearly physical exam. This is also called an annual well check.  Dental exams once or twice a year.  Routine eye exams. Ask your health care provider how often you should have your eyes checked.  Personal lifestyle choices, including:  Daily care of your teeth and gums.  Regular physical activity.  Eating a healthy diet.  Avoiding tobacco and drug use.  Limiting alcohol use.  Practicing safe sex.  Taking low doses of aspirin every day.  Taking vitamin and mineral supplements as recommended by your health care provider. What happens during an annual well check? The services and screenings done by your health care provider during your annual well check will depend on your age, overall health, lifestyle risk factors, and family history of disease. Counseling  Your health care provider may ask you questions about your:  Alcohol use.  Tobacco use.  Drug use.  Emotional well-being.  Home and relationship well-being.  Sexual activity.  Eating habits.  History of falls.  Memory and ability to understand (cognition).  Work and work Statistician. Screening  You may have the following tests or measurements:  Height, weight, and BMI.  Blood pressure.  Lipid and cholesterol levels. These may be checked every 5 years, or more frequently if you are over 38 years old.  Skin check.  Lung cancer screening. You may have this screening every year starting at age 46 if you have a 30-pack-year history of smoking and currently smoke or have quit within the past 15 years.  Fecal occult blood test (FOBT) of the stool. You may have this test every year starting at age 17.  Flexible sigmoidoscopy or colonoscopy. You may  have a sigmoidoscopy every 5 years or a colonoscopy every 10 years starting at age 52.  Prostate cancer screening. Recommendations will  vary depending on your family history and other risks.  Hepatitis C blood test.  Hepatitis B blood test.  Sexually transmitted disease (STD) testing.  Diabetes screening. This is done by checking your blood sugar (glucose) after you have not eaten for a while (fasting). You may have this done every 1-3 years.  Abdominal aortic aneurysm (AAA) screening. You may need this if you are a current or former smoker.  Osteoporosis. You may be screened starting at age 70 if you are at high risk. Talk with your health care provider about your test results, treatment options, and if necessary, the need for more tests. Vaccines  Your health care provider may recommend certain vaccines, such as:  Influenza vaccine. This is recommended every year.  Tetanus, diphtheria, and acellular pertussis (Tdap, Td) vaccine. You may need a Td booster every 10 years.  Zoster vaccine. You may need this after age 21.  Pneumococcal 13-valent conjugate (PCV13) vaccine. One dose is recommended after age 87.  Pneumococcal polysaccharide (PPSV23) vaccine. One dose is recommended after age 42. Talk to your health care provider about which screenings and vaccines you need and how often you need them. This information is not intended to replace advice given to you by your health care provider. Make sure you discuss any questions you have with your health care provider. Document Released: 12/11/2015 Document Revised: 08/03/2016 Document Reviewed: 09/15/2015 Elsevier Interactive Patient Education  2017 [place] Prevention in the Home Falls can cause injuries. They can happen to people of all ages. There are many things you can do to make your home safe and to help prevent falls. What can I do on the outside of my home?  Regularly fix the edges of walkways and driveways and fix any cracks.  Remove anything that might make you trip as you walk through a door, such as a raised step or threshold.  Trim any  bushes or trees on the path to your home.  Use bright outdoor lighting.  Clear any walking paths of anything that might make someone trip, such as rocks or tools.  Regularly check to see if handrails are loose or broken. Make sure that both sides of any steps have handrails.  Any raised decks and porches should have guardrails on the edges.  Have any leaves, snow, or ice cleared regularly.  Use sand or salt on walking paths during winter.  Clean up any spills in your garage right away. This includes oil or grease spills. What can I do in the bathroom?  Use night lights.  Install grab bars by the toilet and in the tub and shower. Do not use towel bars as grab bars.  Use non-skid mats or decals in the tub or shower.  If you need to sit down in the shower, use a plastic, non-slip stool.  Keep the floor dry. Clean up any water that spills on the floor as soon as it happens.  Remove soap buildup in the tub or shower regularly.  Attach bath mats securely with double-sided non-slip rug tape.  Do not have throw rugs and other things on the floor that can make you trip. What can I do in the bedroom?  Use night lights.  Make sure that you have a light by your bed that is easy to reach.  Do not use any sheets or blankets  that are too big for your bed. They should not hang down onto the floor.  Have a firm chair that has side arms. You can use this for support while you get dressed.  Do not have throw rugs and other things on the floor that can make you trip. What can I do in the kitchen?  Clean up any spills right away.  Avoid walking on wet floors.  Keep items that you use a lot in easy-to-reach places.  If you need to reach something above you, use a strong step stool that has a grab bar.  Keep electrical cords out of the way.  Do not use floor polish or wax that makes floors slippery. If you must use wax, use non-skid floor wax.  Do not have throw rugs and other  things on the floor that can make you trip. What can I do with my stairs?  Do not leave any items on the stairs.  Make sure that there are handrails on both sides of the stairs and use them. Fix handrails that are broken or loose. Make sure that handrails are as long as the stairways.  Check any carpeting to make sure that it is firmly attached to the stairs. Fix any carpet that is loose or worn.  Avoid having throw rugs at the top or bottom of the stairs. If you do have throw rugs, attach them to the floor with carpet tape.  Make sure that you have a light switch at the top of the stairs and the bottom of the stairs. If you do not have them, ask someone to add them for you. What else can I do to help prevent falls?  Wear shoes that:  Do not have high heels.  Have rubber bottoms.  Are comfortable and fit you well.  Are closed at the toe. Do not wear sandals.  If you use a stepladder:  Make sure that it is fully opened. Do not climb a closed stepladder.  Make sure that both sides of the stepladder are locked into place.  Ask someone to hold it for you, if possible.  Clearly mark and make sure that you can see:  Any grab bars or handrails.  First and last steps.  Where the edge of each step is.  Use tools that help you move around (mobility aids) if they are needed. These include:  Canes.  Walkers.  Scooters.  Crutches.  Turn on the lights when you go into a dark area. Replace any light bulbs as soon as they burn out.  Set up your furniture so you have a clear path. Avoid moving your furniture around.  If any of your floors are uneven, fix them.  If there are any pets around you, be aware of where they are.  Review your medicines with your doctor. Some medicines can make you feel dizzy. This can increase your chance of falling. Ask your doctor what other things that you can do to help prevent falls. This information is not intended to replace advice given to  you by your health care provider. Make sure you discuss any questions you have with your health care provider. Document Released: 09/10/2009 Document Revised: 04/21/2016 Document Reviewed: 12/19/2014 Elsevier Interactive Patient Education  2017 [employer].

## 2020-09-18 NOTE — Progress Notes (Signed)
Subjective:   Ronald Bond is a 74 y.o. male who presents for Medicare Annual/Subsequent preventive examination.  Review of Systems    No ROS. Medicare Wellness Visit. Cardiac Risk Factors include: advanced age (>35men, >35 women);diabetes mellitus;dyslipidemia;family history of premature cardiovascular disease;hypertension;male gender;obesity (BMI >30kg/m2)     Objective:    Today's Vitals   09/18/20 1139  BP: 130/80  Pulse: (!) 56  Resp: 16  Temp: 98.3 F (36.8 C)  SpO2: 98%  Weight: 214 lb 12.8 oz (97.4 kg)  Height: 5\' 10"  (1.778 m)  PainSc: 5    Body mass index is 30.82 kg/m.  Advanced Directives 09/18/2020 12/07/2018 11/30/2017 11/29/2016 11/18/2013 11/12/2013  Does Patient Have a Medical Advance Directive? No No No No - Patient does not have advance directive;Patient would like information  Would patient like information on creating a medical advance directive? Yes (MAU/Ambulatory/Procedural Areas - Information given) Yes (ED - Information included in AVS) Yes (ED - Information included in AVS) Yes (MAU/Ambulatory/Procedural Areas - Information given) - -  Pre-existing out of facility DNR order (yellow form or pink MOST form) - - - - No -    Current Medications (verified) Outpatient Encounter Medications as of 09/18/2020  Medication Sig  . amLODipine (NORVASC) 10 MG tablet TAKE 1 TABLET BY MOUTH  DAILY  . aspirin 81 MG EC tablet Take 81 mg by mouth daily.    Marland Kitchen atorvastatin (LIPITOR) 40 MG tablet TAKE 1 TABLET (40 MG TOTAL) BY MOUTH DAILY.  . carvedilol (COREG) 3.125 MG tablet Take 1 tablet (3.125 mg total) by mouth 2 (two) times daily. Until medication come from  Mail order  . fluticasone (FLONASE) 50 MCG/ACT nasal spray Place 2 sprays into both nostrils daily.  . indapamide (LOZOL) 1.25 MG tablet Take 1 tablet (1.25 mg total) by mouth daily.  Marland Kitchen levothyroxine (SYNTHROID) 112 MCG tablet Take 1 tablet (112 mcg total) by mouth daily.  Marland Kitchen losartan (COZAAR) 100 MG tablet  TAKE 1 TABLET BY MOUTH  DAILY  . mometasone-formoterol (DULERA) 100-5 MCG/ACT AERO Inhale 2 puffs into the lungs 2 (two) times daily. (Patient taking differently: Inhale 2 puffs into the lungs 2 (two) times daily as needed. )  . Multiple Vitamin (MULTIVITAMIN) tablet Take 1 tablet by mouth daily.  . potassium chloride SA (KLOR-CON) 20 MEQ tablet Take 1 tablet (20 mEq total) by mouth 2 (two) times daily.  . tamsulosin (FLOMAX) 0.4 MG CAPS capsule TAKE 1 CAPSULE BY MOUTH  DAILY AFTER SUPPER  . [DISCONTINUED] amLODipine (NORVASC) 10 MG tablet TAKE 1 TABLET BY MOUTH  DAILY  . [DISCONTINUED] amLODipine (NORVASC) 10 MG tablet TAKE 1 TABLET BY MOUTH  DAILY  . [DISCONTINUED] indapamide (LOZOL) 1.25 MG tablet TAKE 1 TABLET (1.25 MG TOTAL) BY MOUTH DAILY.  . [DISCONTINUED] losartan (COZAAR) 100 MG tablet TAKE 1 TABLET BY MOUTH  DAILY  . [DISCONTINUED] losartan (COZAAR) 100 MG tablet TAKE 1 TABLET BY MOUTH  DAILY  . [DISCONTINUED] tamsulosin (FLOMAX) 0.4 MG CAPS capsule TAKE 1 CAPSULE BY MOUTH  DAILY AFTER SUPPER  . [DISCONTINUED] tamsulosin (FLOMAX) 0.4 MG CAPS capsule TAKE 1 CAPSULE BY MOUTH  DAILY AFTER SUPPER   No facility-administered encounter medications on file as of 09/18/2020.    Allergies (verified) Lisinopril   History: Past Medical History:  Diagnosis Date  . GERD (gastroesophageal reflux disease)   . HTN (hypertension)   . Hyperlipidemia   . Hypothyroidism   . Osteoarthritis   . Wears glasses    Past  Surgical History:  Procedure Laterality Date  . COLONOSCOPY    . EYE SURGERY     both cataracts  . MICROLARYNGOSCOPY Left 11/18/2013   Procedure: MICROLARYNGOSCOPY WITH REMOVAL OF GRANULOMA LEFT SIDE;  Surgeon: Izora Gala, MD;  Location: [facility];  Service: ENT;  Laterality: Left;  . NM MYOVIEW LTD  06/2007   Low risk, normal.  No evidence of ischemia or infarction  . THYROIDECTOMY  1974  . TONSILLECTOMY    . TOTAL KNEE ARTHROPLASTY  2009   left   Family  History  Problem Relation Age of Onset  . Brain cancer Mother   . Heart disease Father   . Heart disease Brother    Social History   Socioeconomic History  . Marital status: Married    Spouse name: Not on file  . Number of children: 2  . Years of education: Not on file  . Highest education level: Not on file  Occupational History  . Occupation: railroad  Tobacco Use  . Smoking status: Never Smoker  . Smokeless tobacco: Never Used  Vaping Use  . Vaping Use: Never used  Substance and Sexual Activity  . Alcohol use: Yes    Comment: occassionally  . Drug use: No  . Sexual activity: Yes  Other Topics Concern  . Not on file  Social History Narrative   Regular Exercise -  YES         Social Determinants of Health   Financial Resource Strain: Low Risk   . Difficulty of Paying Living Expenses: Not hard at all  Food Insecurity: No Food Insecurity  . Worried About Charity fundraiser in the Last Year: Never true  . Ran Out of Food in the Last Year: Never true  Transportation Needs: No Transportation Needs  . Lack of Transportation (Medical): No  . Lack of Transportation (Non-Medical): No  Physical Activity: Sufficiently Active  . Days of Exercise per Week: 5 days  . Minutes of Exercise per Session: 30 min  Stress: No Stress Concern Present  . Feeling of Stress : Not at all  Social Connections: Socially Integrated  . Frequency of Communication with Friends and Family: More than three times a week  . Frequency of Social Gatherings with Friends and Family: More than three times a week  . Attends Religious Services: More than 4 times per year  . Active Member of Clubs or Organizations: Yes  . Attends Archivist Meetings: More than 4 times per year  . Marital Status: Married    Tobacco Counseling Counseling given: Not Answered   Clinical Intake:  Pre-visit preparation completed: Yes  Pain : 0-10 Pain Score: 5  Pain Type: Chronic pain Pain Location:  Knee Pain Orientation: Right Pain Radiating Towards: n/a Pain Descriptors / Indicators: Aching, Pressure Pain Onset: More than a month ago Pain Frequency: Intermittent Pain Relieving Factors: none Effect of Pain on Daily Activities: Pain produces disability and affects the quality of life.  Pain Relieving Factors: none  BMI - recorded: 30.82 Nutritional Status: BMI > 30  Obese Nutritional Risks: None Diabetes: Yes CBG done?: No Did pt. bring in CBG monitor from home?: No  How often do you need to have someone help you when you read instructions, pamphlets, or other written materials from your doctor or pharmacy?: 1 - Never What is the last grade level you completed in school?: HSG  Diabetic? yes  Interpreter Needed?: No  Information entered by :: Culley Hedeen N. Lowell Guitar, LPN  Activities of Daily Living In your present state of health, do you have any difficulty performing the following activities: 09/18/2020  Hearing? N  Vision? N  Difficulty concentrating or making decisions? N  Walking or climbing stairs? N  Dressing or bathing? N  Doing errands, shopping? N  Preparing Food and eating ? N  Using the Toilet? N  In the past six months, have you accidently leaked urine? N  Do you have problems with loss of bowel control? N  Managing your Medications? N  Managing your Finances? N  Housekeeping or managing your Housekeeping? N  Some recent data might be hidden    Patient Care Team: Janith Lima, MD as PCP - General Leonie Man, MD as PCP - Cardiology (Cardiology) Leonie Man, MD as Consulting Physician (Cardiology) Franchot Gallo, MD as Consulting Physician (Urology) Deneise Lever, MD as Consulting Physician (Pulmonary Disease) groat (Dentistry)  Indicate any recent Medical Services you may have received from other than [facility] providers in the past year (date may be approximate).     Assessment:   This is a routine wellness examination for  Ronald Bond.  Hearing/Vision screen No exam data present  Dietary issues and exercise activities discussed: Current Exercise Habits: Home exercise routine;The patient has a physically strenuous job, but has no regular exercise apart from work., Type of exercise: walking, Time (Minutes): 30 (regular exercise), Frequency (Times/Week): 5, Weekly Exercise (Minutes/Week): 150, Intensity: Mild, Exercise limited by: orthopedic condition(s)  Goals    . Patient Stated     I want to go back to the [facility] and start walking. I feel I can go and walk every other day. Decrease eating fatty food and drinking sugary drinks.    . Patient Stated     Increase my physical activity by going to the [facility]     . Weight (lb) < 200 lb (90.7 kg)     Would like to lose 20 pounds by increasing activity and making healthy food choices.       Depression Screen PHQ 2/9 Scores 09/18/2020 05/12/2020 12/07/2018 10/15/2018 04/30/2018 12/08/2017 11/30/2017  PHQ - 2 Score 0 0 0 0 0 0 0    Fall Risk Fall Risk  09/18/2020 05/12/2020 12/07/2018 10/15/2018 04/30/2018  Falls in the past year? 0 0 0 0 No  Number falls in past yr: 0 0 - - -  Injury with Fall? 0 0 - - -  Risk for fall due to : No Fall Risks No Fall Risks - - -  Follow up Falls evaluation completed;Education provided Falls evaluation completed - - -    Any stairs in or around the home? Yes  If so, are there any without handrails? No  Home free of loose throw rugs in walkways, pet beds, electrical cords, etc? Yes  Adequate lighting in your home to reduce risk of falls? Yes   ASSISTIVE DEVICES UTILIZED TO PREVENT FALLS:  Life alert? No  Use of a cane, walker or w/c? No  Grab bars in the bathroom? No  Shower chair or bench in shower? No  Elevated toilet seat or a handicapped toilet? No   TIMED UP AND GO:  Was the test performed? No .  Length of time to ambulate 10 feet: 0 sec.   Gait steady and fast without use of assistive device  Cognitive Function: MMSE - Mini  Mental State Exam 11/30/2017  Orientation to time 5  Orientation to Place 5  Registration 3  Attention/ Calculation 5  Recall 1  Language- name 2 objects 2  Language- repeat 1  Language- follow 3 step command 3  Language- read & follow direction 1  Write a sentence 1  Copy design 1  Total score 28     6CIT Screen 09/18/2020  What Year? 0 points  What month? 0 points  What time? 0 points  Count back from 20 0 points  Months in reverse 0 points  Repeat phrase 0 points  Total Score 0    Immunizations Immunization History  Administered Date(s) Administered  . Fluad Quad(high Dose 65+) 07/27/2019  . Influenza Split 10/24/2011  . Influenza Whole 08/19/2009, 07/23/2010  . Influenza, High Dose Seasonal PF 10/21/2013, 09/27/2016, 09/07/2017, 09/14/2018  . Influenza,inj,Quad PF,6+ Mos 08/07/2014, 08/25/2015  . PFIZER SARS-COV-2 Vaccination 01/27/2020, 02/25/2020  . Pneumococcal Conjugate-13 08/25/2015  . Pneumococcal Polysaccharide-23 11/28/2004, 03/08/2016  . Td 11/28/2004  . Tdap 08/25/2015  . Zoster 02/24/2011  . Zoster Recombinat (Shingrix) 08/23/2019    TDAP status: Up to date Flu Vaccine status: Up to date Pneumococcal vaccine status: Up to date Covid-19 vaccine status: Completed vaccines  Qualifies for Shingles Vaccine? Yes   Zostavax completed Yes   Shingrix Completed?: Yes  Screening Tests Health Maintenance  Topic Date Due  . FOOT EXAM  Never done  . INFLUENZA VACCINE  06/28/2020  . OPHTHALMOLOGY EXAM  10/03/2020  . HEMOGLOBIN A1C  11/11/2020  . Fecal DNA (Cologuard)  03/21/2021  . TETANUS/TDAP  08/24/2025  . COVID-19 Vaccine  Completed  . Hepatitis C Screening  Completed  . PNA vac Low Risk Adult  Completed    Health Maintenance  Health Maintenance Due  Topic Date Due  . FOOT EXAM  Never done  . INFLUENZA VACCINE  06/28/2020    Colorectal cancer screening: Completed 03/02/2018. Repeat every 5 years  Lung Cancer Screening: (Low Dose CT Chest  recommended if Age 45-80 years, 30 pack-year currently smoking OR have quit w/in 15years.) does not qualify.   Lung Cancer Screening Referral: no  Additional Screening:  Hepatitis C Screening: does qualify; Completed yes  Vision Screening: Recommended annual ophthalmology exams for early detection of glaucoma and other disorders of the eye. Is the patient up to date with their annual eye exam?  Yes  Who is the provider or what is the name of the office in which the patient attends annual eye exams? [facility] If pt is not established with a provider, would they like to be referred to a provider to establish care? No .   Dental Screening: Recommended annual dental exams for proper oral hygiene  Community Resource Referral / Chronic Care Management: CRR required this visit?  No   CCM required this visit?  No      Plan:     I have personally reviewed and noted the following in the patient's chart:   . Medical and social history . Use of alcohol, tobacco or illicit drugs  . Current medications and supplements . Functional ability and status . Nutritional status . Physical activity . Advanced directives . List of other physicians . Hospitalizations, surgeries, and ER visits in previous 12 months . Vitals . Screenings to include cognitive, depression, and falls . Referrals and appointments  In addition, I have reviewed and discussed with patient certain preventive protocols, quality metrics, and best practice recommendations. A written personalized care plan for preventive services as well as general preventive health recommendations were provided to patient.     Sheral Flow, LPN  09/18/2020   Nurse Notes: n/a

## 2020-10-10 MED FILL — LEVOTHYROXINE SODIUM 112 MC: 112 | 90 days supply | Qty: 90 | Fill #0

## 2020-11-03 ENCOUNTER — Telehealth: Payer: Self-pay | Admitting: Internal Medicine

## 2020-11-03 NOTE — Progress Notes (Signed)
  Chronic Care Management   Outreach Note  11/03/2020 Name: Ronald Bond MRN: [id number] DOB: 05-03-1946  Referred by: Janith Lima, MD Reason for referral : No chief complaint on file.   Third unsuccessful telephone outreach was attempted today. The patient was referred to the pharmacist for assistance with care management and care coordination.   Follow Up Plan:   Carley Perdue [employer] Scheduler

## 2020-11-06 DIAGNOSIS — E0789 Other specified disorders of thyroid: Secondary | ICD-10-CM | POA: Diagnosis not present

## 2020-11-09 ENCOUNTER — Other Ambulatory Visit: Payer: Self-pay | Admitting: Internal Medicine

## 2020-11-09 DIAGNOSIS — I119 Hypertensive heart disease without heart failure: Secondary | ICD-10-CM

## 2020-11-16 ENCOUNTER — Other Ambulatory Visit: Payer: Self-pay | Admitting: Internal Medicine

## 2020-11-16 DIAGNOSIS — T502X5A Adverse effect of carbonic-anhydrase inhibitors, benzothiadiazides and other diuretics, initial encounter: Secondary | ICD-10-CM

## 2020-11-16 DIAGNOSIS — I10 Essential (primary) hypertension: Secondary | ICD-10-CM

## 2020-11-16 DIAGNOSIS — E876 Hypokalemia: Secondary | ICD-10-CM

## 2020-11-30 ENCOUNTER — Other Ambulatory Visit: Payer: Self-pay

## 2020-11-30 ENCOUNTER — Encounter: Payer: Self-pay | Admitting: Internal Medicine

## 2020-11-30 ENCOUNTER — Ambulatory Visit (INDEPENDENT_AMBULATORY_CARE_PROVIDER_SITE_OTHER): Payer: Medicare HMO | Admitting: Internal Medicine

## 2020-11-30 ENCOUNTER — Ambulatory Visit: Payer: Medicare HMO

## 2020-11-30 VITALS — BP 136/82 | HR 65 | Temp 98.5°F | Resp 16 | Ht 70.0 in | Wt 217.0 lb

## 2020-11-30 DIAGNOSIS — I119 Hypertensive heart disease without heart failure: Secondary | ICD-10-CM

## 2020-11-30 DIAGNOSIS — N1832 Chronic kidney disease, stage 3b: Secondary | ICD-10-CM | POA: Diagnosis not present

## 2020-11-30 DIAGNOSIS — E890 Postprocedural hypothyroidism: Secondary | ICD-10-CM | POA: Diagnosis not present

## 2020-11-30 DIAGNOSIS — D508 Other iron deficiency anemias: Secondary | ICD-10-CM | POA: Diagnosis not present

## 2020-11-30 DIAGNOSIS — N401 Enlarged prostate with lower urinary tract symptoms: Secondary | ICD-10-CM

## 2020-11-30 DIAGNOSIS — I10 Essential (primary) hypertension: Secondary | ICD-10-CM

## 2020-11-30 DIAGNOSIS — R351 Nocturia: Secondary | ICD-10-CM

## 2020-11-30 DIAGNOSIS — E785 Hyperlipidemia, unspecified: Secondary | ICD-10-CM | POA: Diagnosis not present

## 2020-11-30 DIAGNOSIS — T502X5A Adverse effect of carbonic-anhydrase inhibitors, benzothiadiazides and other diuretics, initial encounter: Secondary | ICD-10-CM

## 2020-11-30 DIAGNOSIS — E118 Type 2 diabetes mellitus with unspecified complications: Secondary | ICD-10-CM

## 2020-11-30 DIAGNOSIS — E876 Hypokalemia: Secondary | ICD-10-CM | POA: Diagnosis not present

## 2020-11-30 DIAGNOSIS — R001 Bradycardia, unspecified: Secondary | ICD-10-CM | POA: Diagnosis not present

## 2020-11-30 DIAGNOSIS — Z20822 Contact with and (suspected) exposure to covid-19: Secondary | ICD-10-CM | POA: Diagnosis not present

## 2020-11-30 DIAGNOSIS — R3915 Urgency of urination: Secondary | ICD-10-CM | POA: Diagnosis not present

## 2020-11-30 DIAGNOSIS — R350 Frequency of micturition: Secondary | ICD-10-CM | POA: Diagnosis not present

## 2020-11-30 DIAGNOSIS — N1831 Chronic kidney disease, stage 3a: Secondary | ICD-10-CM

## 2020-11-30 LAB — CBC WITH DIFFERENTIAL/PLATELET
Basophils Absolute: 0.1 10*3/uL (ref 0.0–0.1)
Basophils Relative: 0.9 % (ref 0.0–3.0)
Eosinophils Absolute: 0.3 10*3/uL (ref 0.0–0.7)
Eosinophils Relative: 4.4 % (ref 0.0–5.0)
HCT: 38.5 % — ABNORMAL LOW (ref 39.0–52.0)
Hemoglobin: 12.9 g/dL — ABNORMAL LOW (ref 13.0–17.0)
Lymphocytes Relative: 21.1 % (ref 12.0–46.0)
Lymphs Abs: 1.6 10*3/uL (ref 0.7–4.0)
MCHC: 33.5 g/dL (ref 30.0–36.0)
MCV: 89.9 fl (ref 78.0–100.0)
Monocytes Absolute: 0.7 10*3/uL (ref 0.1–1.0)
Monocytes Relative: 10.1 % (ref 3.0–12.0)
Neutro Abs: 4.7 10*3/uL (ref 1.4–7.7)
Neutrophils Relative %: 63.5 % (ref 43.0–77.0)
Platelets: 261 10*3/uL (ref 150.0–400.0)
RBC: 4.28 Mil/uL (ref 4.22–5.81)
RDW: 14.7 % (ref 11.5–15.5)
WBC: 7.3 10*3/uL (ref 4.0–10.5)

## 2020-11-30 LAB — BASIC METABOLIC PANEL
BUN: 15 mg/dL (ref 6–23)
CO2: 29 mEq/L (ref 19–32)
Calcium: 9.8 mg/dL (ref 8.4–10.5)
Chloride: 100 mEq/L (ref 96–112)
Creatinine, Ser: 1.52 mg/dL — ABNORMAL HIGH (ref 0.40–1.50)
GFR: 44.7 mL/min — ABNORMAL LOW (ref >60.00)
Glucose, Bld: 96 mg/dL (ref 70–99)
Potassium: 4 mEq/L (ref 3.5–5.1)
Sodium: 138 mEq/L (ref 135–145)

## 2020-11-30 LAB — HEMOGLOBIN A1C: Hgb A1c MFr Bld: 6.2 % (ref 4.6–6.5)

## 2020-11-30 LAB — URINALYSIS, ROUTINE W REFLEX MICROSCOPIC
Bilirubin Urine: NEGATIVE
Hgb urine dipstick: NEGATIVE
Ketones, ur: NEGATIVE
Leukocytes,Ua: NEGATIVE
Nitrite: NEGATIVE
Specific Gravity, Urine: 1.02 (ref 1.000–1.030)
Total Protein, Urine: 30 — AB
Urine Glucose: NEGATIVE
Urobilinogen, UA: 0.2 (ref 0.0–1.0)
pH: 6 (ref 5.0–8.0)

## 2020-11-30 LAB — PSA: PSA: 3.26 ng/mL (ref 0.10–4.00)

## 2020-11-30 LAB — TSH: TSH: 3.64 u[IU]/mL (ref 0.35–4.50)

## 2020-11-30 LAB — LIPID PANEL
Cholesterol: 171 mg/dL (ref 0–200)
HDL: 63.7 mg/dL (ref >39.00)
LDL Cholesterol: 89 mg/dL (ref 0–99)
NonHDL: 107.6
Total CHOL/HDL Ratio: 3
Triglycerides: 95 mg/dL (ref 0.0–149.0)
VLDL: 19 mg/dL (ref 0.0–40.0)

## 2020-11-30 LAB — HEPATIC FUNCTION PANEL
ALT: 17 U/L (ref 0–53)
AST: 15 U/L (ref 0–37)
Albumin: 4.7 g/dL (ref 3.5–5.2)
Alkaline Phosphatase: 69 U/L (ref 39–117)
Bilirubin, Direct: 0.1 mg/dL (ref 0.0–0.3)
Total Bilirubin: 0.5 mg/dL (ref 0.2–1.2)
Total Protein: 7.5 g/dL (ref 6.0–8.3)

## 2020-11-30 LAB — MICROALBUMIN / CREATININE URINE RATIO
Creatinine,U: 123.2 mg/dL
Microalb Creat Ratio: 17.1 mg/g (ref 0.0–30.0)
Microalb, Ur: 21 mg/dL — ABNORMAL HIGH (ref 0.0–1.9)

## 2020-11-30 LAB — MAGNESIUM: Magnesium: 2.2 mg/dL (ref 1.5–2.5)

## 2020-11-30 MED ORDER — LOSARTAN POTASSIUM 100 MG PO TABS: 100.0000 mg | ORAL_TABLET | Freq: Every day | ORAL | 1 refills | Status: DC

## 2020-11-30 NOTE — Patient Instructions (Signed)
Bradycardia, Adult Bradycardia is a slower-than-normal heartbeat. A normal resting heart rate for an adult ranges from 60 to 100 beats per minute. With bradycardia, the resting heart rate is less than 60 beats per minute. Bradycardia can prevent enough oxygen from reaching certain areas of your body when you are active. It can be serious if it keeps enough oxygen from reaching your brain and other parts of your body. Bradycardia is not a problem for everyone. For some healthy adults, a slow resting heart rate is normal. What are the causes? This condition may be caused by:  A problem with the heart, including: ? A problem with the heart's electrical system, such as a heart block. With a heart block, electrical signals between the chambers of the heart are partially or completely blocked, so they are not able to work as they should. ? A problem with the heart's natural pacemaker (sinus node). ? Heart disease. ? A heart attack. ? Heart damage. ? Lyme disease. ? A heart infection. ? A heart condition that is present at birth (congenital heart defect).  Certain medicines that treat heart conditions.  Certain conditions, such as hypothyroidism and obstructive sleep apnea.  Problems with the balance of chemicals and other substances, like potassium, in the blood.  Trauma.  Radiation therapy. What increases the risk? You are more likely to develop this condition if you:  Are age 65 or older.  Have high blood pressure (hypertension), high cholesterol (hyperlipidemia), or diabetes.  Drink heavily, use tobacco or nicotine products, or use drugs. What are the signs or symptoms? Symptoms of this condition include:  Light-headedness.  Feeling faint or fainting.  Fatigue and weakness.  Trouble with activity or exercise.  Shortness of breath.  Chest pain (angina).  Drowsiness.  Confusion.  Dizziness. How is this diagnosed? This condition may be diagnosed based on:  Your  symptoms.  Your medical history.  A physical exam. During the exam, your health care provider will listen to your heartbeat and check your pulse. To confirm the diagnosis, your health care provider may order tests, such as:  Blood tests.  An electrocardiogram (ECG). This test records the heart's electrical activity. The test can show how fast your heart is beating and whether the heartbeat is steady.  A test in which you wear a portable device (event recorder or Holter monitor) to record your heart's electrical activity while you go about your day.  Anexercise test. How is this treated? Treatment for this condition depends on the cause of the condition and how severe your symptoms are. Treatment may involve:  Treatment of the underlying condition.  Changing your medicines or how much medicine you take.  Having a small, battery-operated device called a pacemaker implanted under the skin. When bradycardia occurs, this device can be used to increase your heart rate and help your heart beat in a regular rhythm. Follow these instructions at home: Lifestyle   Manage any health conditions that contribute to bradycardia as told by your health care provider.  Follow a heart-healthy diet. A nutrition specialist (dietitian) can help educate you about healthy food options and changes.  Follow an exercise program that is approved by your health care provider.  Maintain a healthy weight.  Try to reduce or manage your stress, such as with yoga or meditation. If you need help reducing stress, ask your health care provider.  Do not use any products that contain nicotine or tobacco, such as cigarettes, e-cigarettes, and chewing tobacco. If you need help   quitting, ask your health care provider.  Do not use illegal drugs.  Limit alcohol intake to no more than 1 drink a day for nonpregnant women and 2 drinks a day for men. Be aware of how much alcohol is in your drink. In the [place] one drink  equals one 12 oz bottle of beer (355 mL), one 5 oz glass of wine (148 mL), or one 1 oz glass of hard liquor (44 mL). General instructions  Take over-the-counter and prescription medicines only as told by your health care provider.  Keep all follow-up visits as told by your health care provider. This is important. How is this prevented? In some cases, bradycardia may be prevented by:  Treating underlying medical problems.  Stopping behaviors or medicines that can trigger the condition. Contact a health care provider if you:  Feel light-headed or dizzy.  Almost faint.  Feel weak or are easily fatigued during physical activity.  Experience confusion or have memory problems. Get help right away if:  You faint.  You have: ? An irregular heartbeat (palpitations). ? Chest pain. ? Trouble breathing. Summary  Bradycardia is a slower-than-normal heartbeat. With bradycardia, the resting heart rate is less than 60 beats per minute.  Treatment for this condition depends on the cause.  Manage any health conditions that contribute to bradycardia as told by your health care provider.  Do not use any products that contain nicotine or tobacco, such as cigarettes, e-cigarettes, and chewing tobacco, and limit alcohol intake.  Keep all follow-up visits as told by your health care provider. This is important. This information is not intended to replace advice given to you by your health care provider. Make sure you discuss any questions you have with your health care provider. Document Revised: 05/28/2018 Document Reviewed: 04/25/2018 Elsevier Patient Education  2020 [employer]  

## 2020-11-30 NOTE — Progress Notes (Signed)
Subjective:  Patient ID: Ronald Bond, male    DOB: 04-13-1946  Age: 75 y.o. MRN: [id number]  CC: Hypothyroidism, Hyperlipidemia, Diabetes, and Anemia  This visit occurred during the SARS-CoV-2 public health emergency.  Safety protocols were in place, including screening questions prior to the visit, additional usage of staff PPE, and extensive cleaning of exam room while observing appropriate contact time as indicated for disinfecting solutions.    HPI Ronald Bond presents for f/up - He needs surgical clearance to have oculoplasty done at [facility] in [place].  He is active and denies any recent episodes of chest pain, shortness of breath, palpitations, edema, fatigue, dizziness, lightheadedness, or near syncope.  It looks like he is no longer taking carvedilol.  He has not been monitoring his blood pressure or his blood sugar.  He has no symptoms of obstructive uropathy.  Outpatient Medications Prior to Visit  Medication Sig Dispense Refill  . amLODipine (NORVASC) 10 MG tablet TAKE 1 TABLET BY MOUTH  DAILY 90 tablet 1  . atorvastatin (LIPITOR) 40 MG tablet TAKE 1 TABLET (40 MG TOTAL) BY MOUTH DAILY. 90 tablet 1  . carvedilol (COREG) 3.125 MG tablet Take 1 tablet (3.125 mg total) by mouth 2 (two) times daily. Until medication come from  Mail order 60 tablet 0  . fluticasone (FLONASE) 50 MCG/ACT nasal spray Place 2 sprays into both nostrils daily. 48 g 1  . indapamide (LOZOL) 1.25 MG tablet TAKE 1 TABLET (1.25 MG TOTAL) BY MOUTH DAILY. 90 tablet 0  . KLOR-CON M20 20 MEQ tablet TAKE 1 TABLET BY MOUTH TWICE A DAY 180 tablet 0  . levothyroxine (SYNTHROID) 112 MCG tablet Take 1 tablet (112 mcg total) by mouth daily. 90 tablet 1  . mometasone-formoterol (DULERA) 100-5 MCG/ACT AERO Inhale 2 puffs into the lungs 2 (two) times daily. (Patient taking differently: Inhale 2 puffs into the lungs 2 (two) times daily as needed.) 13 g 11  . Multiple Vitamin (MULTIVITAMIN) tablet Take 1  tablet by mouth daily.    . tamsulosin (FLOMAX) 0.4 MG CAPS capsule TAKE 1 CAPSULE BY MOUTH  DAILY AFTER SUPPER 90 capsule 1  . aspirin 81 MG EC tablet Take 81 mg by mouth daily.    Marland Kitchen losartan (COZAAR) 100 MG tablet TAKE 1 TABLET BY MOUTH  DAILY 90 tablet 1   No facility-administered medications prior to visit.    ROS Review of Systems  Constitutional: Negative for appetite change, chills, diaphoresis, fatigue and fever.  HENT: Negative.  Negative for sore throat and trouble swallowing.   Eyes: Positive for redness. Negative for pain and visual disturbance.  Respiratory: Negative for cough, chest tightness, shortness of breath and wheezing.   Cardiovascular: Negative for chest pain, palpitations and leg swelling.  Gastrointestinal: Negative for abdominal pain, constipation, diarrhea, nausea and vomiting.  Endocrine: Negative.  Negative for cold intolerance, heat intolerance, polydipsia, polyphagia and polyuria.  Genitourinary: Negative.  Negative for difficulty urinating, dysuria, frequency and hematuria.  Musculoskeletal: Negative.  Negative for arthralgias, back pain, myalgias and neck pain.  Skin: Negative.   Neurological: Negative.  Negative for dizziness, tremors, speech difficulty, weakness and light-headedness.  Hematological: Negative for adenopathy. Does not bruise/bleed easily.  Psychiatric/Behavioral: Negative.     Objective:  BP 136/82 (BP Location: Left Arm, Patient Position: Sitting)   Pulse 65   Temp 98.5 F (36.9 C) (Oral)   Resp 16   Ht 5\' 10"  (1.778 m)   Wt 217 lb (98.4 kg)  SpO2 99%   BMI 31.14 kg/m   BP Readings from Last 3 Encounters:  11/30/20 136/82  09/18/20 130/80  07/13/20 (!) 144/80    Wt Readings from Last 3 Encounters:  11/30/20 217 lb (98.4 kg)  09/18/20 214 lb 12.8 oz (97.4 kg)  07/13/20 216 lb 12.8 oz (98.3 kg)    Physical Exam Vitals reviewed.  Constitutional:      Appearance: Normal appearance.  HENT:     Nose: Nose normal.      Mouth/Throat:     Mouth: Mucous membranes are moist.  Eyes:     General: No scleral icterus.       Right eye: No discharge or hordeolum.        Left eye: No discharge or hordeolum.     Extraocular Movements:     Right eye: Abnormal extraocular motion present.     Left eye: Abnormal extraocular motion present.     Conjunctiva/sclera:     Right eye: Right conjunctiva is injected.     Left eye: Left conjunctiva is injected.     Comments: ++proptosis R>>L  Cardiovascular:     Rate and Rhythm: Regular rhythm. Bradycardia present.     Pulses: Normal pulses.     Heart sounds: No murmur heard.     Comments: EKG- Atrial Bradycardia, 54 bpm Left axis deviation - New (? lead placement) No LVH or Q waves Pulmonary:     Effort: Pulmonary effort is normal.     Breath sounds: No stridor. No wheezing, rhonchi or rales.  Abdominal:     General: Abdomen is flat. Bowel sounds are normal. There is no distension.     Palpations: Abdomen is soft. There is no hepatomegaly, splenomegaly or mass.     Tenderness: There is no abdominal tenderness.  Musculoskeletal:        General: Normal range of motion.     Cervical back: Neck supple.     Right lower leg: No edema.     Left lower leg: No edema.  Lymphadenopathy:     Cervical: No cervical adenopathy.  Skin:    General: Skin is warm and dry.     Coloration: Skin is not pale.  Neurological:     General: No focal deficit present.     Mental Status: He is alert and oriented to person, place, and time. Mental status is at baseline.  Psychiatric:        Mood and Affect: Mood normal.        Behavior: Behavior normal.     Lab Results  Component Value Date   WBC 7.3 11/30/2020   HGB 12.9 (L) 11/30/2020   HCT 38.5 (L) 11/30/2020   PLT 261.0 11/30/2020   GLUCOSE 96 11/30/2020   CHOL 171 11/30/2020   TRIG 95.0 11/30/2020   HDL 63.70 11/30/2020   LDLDIRECT 75.0 03/08/2016   LDLCALC 89 11/30/2020   ALT 17 11/30/2020   AST 15 11/30/2020   NA 138  11/30/2020   K 4.0 11/30/2020   CL 100 11/30/2020   CREATININE 1.52 (H) 11/30/2020   BUN 15 11/30/2020   CO2 29 11/30/2020   TSH 3.64 11/30/2020   PSA 3.26 11/30/2020   INR 0.9 10/17/2007   HGBA1C 6.2 11/30/2020   MICROALBUR 21.0 (H) 11/30/2020    CT CORONARY FRACTIONAL FLOW RESERVE DATA PREP  Result Date: 06/05/2019 CLINICAL DATA:  Chest pain EXAM: CT FFR MEDICATIONS: No additional medications TECHNIQUE: The coronary CT was sent for FFR. FINDINGS: No  modeled stenosis > 30%. IMPRESSION: No evidence for hemodynamically significant coronary disease. Dalton Mclean Electronically Signed   By: Loralie Champagne M.D.   On: 05/28/2019 16:33   CT CORONARY FRACTIONAL FLOW RESERVE FLUID ANALYSIS  Result Date: 06/05/2019 CLINICAL DATA:  Chest pain EXAM: CT FFR MEDICATIONS: No additional medications TECHNIQUE: The coronary CT was sent for FFR. FINDINGS: No modeled stenosis > 30%. IMPRESSION: No evidence for hemodynamically significant coronary disease. Dalton Mclean Electronically Signed   By: Loralie Champagne M.D.   On: 05/28/2019 16:33    Assessment & Plan:   Turan was seen today for hypothyroidism, hyperlipidemia, diabetes and anemia.  Diagnoses and all orders for this visit:  Essential hypertension, benign- His blood pressure is adequately well controlled.  According to prescription refills he is no longer taking carvedilol though he is bradycardic on his EKG so he may actually be taking it.  He has had some decline in his renal function and mild microalbuminuria.  Will continue the combination of a thiazide diuretic and ARB. -     Basic metabolic panel; Future -     Urinalysis, Routine w reflex microscopic; Future -     Magnesium; Future -     EKG 12-Lead -     Magnesium -     Urinalysis, Routine w reflex microscopic -     Basic metabolic panel -     losartan (COZAAR) 100 MG tablet; Take 1 tablet (100 mg total) by mouth daily.  Type II diabetes mellitus with manifestations ([place])- His blood  sugar is adequately well controlled. -     Basic metabolic panel; Future -     Hemoglobin A1c; Future -     Microalbumin / creatinine urine ratio; Future -     HM Diabetes Foot Exam -     Microalbumin / creatinine urine ratio -     Hemoglobin A1c -     Basic metabolic panel -     losartan (COZAAR) 100 MG tablet; Take 1 tablet (100 mg total) by mouth daily.  Postablative hypothyroidism- His TSH is in the normal range.  He will stay on the current dose of levothyroxine. -     TSH; Future -     TSH  Stage 3b chronic kidney disease ([place])- His GFR is about 49.  There is some evidence of diabetic nephropathy.  Will continue the ARB. -     Basic metabolic panel; Future -     Urinalysis, Routine w reflex microscopic; Future -     Microalbumin / creatinine urine ratio; Future -     Microalbumin / creatinine urine ratio -     Urinalysis, Routine w reflex microscopic -     Basic metabolic panel  BPH associated with nocturia- His symptoms are adequately well controlled and his PSA is not rising.  This is a reassuring sign that he does not have prostate cancer. -     PSA; Future -     PSA  Hyperlipidemia with target LDL less than 100- He has achieved his LDL goal and is doing well on the statin. -     Lipid panel; Future -     TSH; Future -     Hepatic function panel; Future -     Hepatic function panel -     TSH -     Lipid panel  Iron deficiency anemia secondary to inadequate dietary iron intake- His H&H have improved.  His recent vitamin levels were normal.  I  think there is some component of anemia of chronic disease as well. -     CBC with Differential/Platelet; Future -     CBC with Differential/Platelet  Diuretic-induced hypokalemia- His potassium level is normal now.  Will continue the current potassium supplement. -     Basic metabolic panel; Future -     Magnesium; Future -     Magnesium -     Basic metabolic panel  Bradycardia- I have asked him to have a follow-up with  cardiology before he has the oculoplasty. -     Ambulatory referral to Cardiology  Stage 3a chronic kidney disease ([place])  Hypertensive heart disease without CHF (congestive heart failure)-his blood pressure is adequately well controlled and his volume status is normal. -     losartan (COZAAR) 100 MG tablet; Take 1 tablet (100 mg total) by mouth daily.   I have discontinued [name] "Johncarlo Heick"'s aspirin. I have also changed his losartan. Additionally, I am having him maintain his mometasone-formoterol, multivitamin, fluticasone, atorvastatin, carvedilol, levothyroxine, tamsulosin, amLODipine, indapamide, and Klor-Con M20.  Meds ordered this encounter  Medications  . losartan (COZAAR) 100 MG tablet    Sig: Take 1 tablet (100 mg total) by mouth daily.    Dispense:  90 tablet    Refill:  1   I spent 50 minutes in preparing to see the patient by review of recent labs, imaging and procedures, obtaining and reviewing separately obtained history, communicating with the patient and family or caregiver, ordering medications, tests or procedures, and documenting clinical information in the EHR including the differential Dx, treatment, and any further evaluation and other management of 1. Essential hypertension, benign 2. Type II diabetes mellitus with manifestations ([place]) 3. Postablative hypothyroidism 4. Stage 3b chronic kidney disease ([place]) 5. BPH associated with nocturia 6. Hyperlipidemia with target LDL less than 100 7. Iron deficiency anemia secondary to inadequate dietary iron intake 8. Diuretic-induced hypokalemia 9. Bradycardia 10. Stage 3a chronic kidney disease ([place]) 11. Hypertensive heart disease without CHF (congestive heart failure)     Follow-up: Return in about 3 months (around 02/28/2021).  Scarlette Calico, MD

## 2020-12-02 DIAGNOSIS — Z20822 Contact with and (suspected) exposure to covid-19: Secondary | ICD-10-CM | POA: Diagnosis not present

## 2020-12-04 DIAGNOSIS — U071 COVID-19: Secondary | ICD-10-CM | POA: Diagnosis not present

## 2020-12-21 ENCOUNTER — Other Ambulatory Visit: Payer: Self-pay

## 2020-12-21 ENCOUNTER — Ambulatory Visit: Payer: Medicare HMO | Admitting: Cardiology

## 2020-12-21 ENCOUNTER — Encounter: Payer: Self-pay | Admitting: Cardiology

## 2020-12-21 DIAGNOSIS — I119 Hypertensive heart disease without heart failure: Secondary | ICD-10-CM | POA: Diagnosis not present

## 2020-12-21 DIAGNOSIS — E785 Hyperlipidemia, unspecified: Secondary | ICD-10-CM | POA: Diagnosis not present

## 2020-12-21 DIAGNOSIS — I10 Essential (primary) hypertension: Secondary | ICD-10-CM

## 2020-12-21 MED ORDER — CARVEDILOL 6.25 MG PO TABS: 6.2500 mg | ORAL_TABLET | Freq: Two times a day (BID) | ORAL | 3 refills | Status: DC

## 2020-12-21 NOTE — Patient Instructions (Addendum)
Medication Instructions:   You may take 2 tablets of  Carvedilol 3.125 mg twice a day to finish current bottle then   Carvedilol to 6.25 mg twice a day - new refill sent to mail ordere *If you need a refill on your cardiac medications before your next appointment, please call your pharmacy*  Other Instructions Notify the office if  More active chest discomfort or shortness of breath with and without activity.  Lab Work:  Not needed   Testing/Procedures: Not needed   Follow-Up: At [facility], you and your health needs are our priority.  As part of our continuing mission to provide you with exceptional heart care, we have created designated Provider Care Teams.  These Care Teams include your primary Cardiologist (physician) and Advanced Practice Providers (APPs -  Physician Assistants and Nurse Practitioners) who all work together to provide you with the care you need, when you need it.     Your next appointment:   6 month(s)  The format for your next appointment:   In Person  Provider:   Glenetta Hew, MD

## 2020-12-21 NOTE — Progress Notes (Signed)
Primary Care Provider: Janith Lima, MD Cardiologist: Glenetta Hew, MD Electrophysiologist: None  Clinic Note: Chief Complaint  Patient presents with   Follow-up    Exercise intolerance  ====================================  ASSESSMENT/PLAN   Problem List Items Addressed This Visit    Hypertensive heart disease without CHF (congestive heart failure) (Chronic)    He tolerates examination blood pressure.  Relatively euvolemic on current meds. Plan: Increase carvedilol to 6.25 mg twice daily.  Continue additional blood pressure control with amlodipine and losartan max dose.  Also on indapamide with K+ supplement.  Symptomatically seems like NYHA class II-III.      Relevant Medications   carvedilol (COREG) 6.25 MG tablet   Hyperlipidemia with target LDL less than 100 (Chronic)    Relatively recent labs show that the LDL is not quite where he wanted to be.  Up to 89, still relatively stable.  Continue current dose of his atorvastatin      Relevant Medications   carvedilol (COREG) 6.25 MG tablet   Essential hypertension, benign (Chronic)    Blood pressure stable.  Kidney function little worse.  Want to watch closely.  Increasing carvedilol to 6.25 mg labile.  This is along with max dose amlodipine and losartan.      Relevant Medications   carvedilol (COREG) 6.25 MG tablet      ===================================  HPI:    Ronald Bond is a 75 y.o. male with a PMH notable for HYPERTENSIVE HEART DISEASE without CHF, frequent PVCs, HLD, and OSA-not using CPAP regularly -who presents today for delayed annual follow-up.  Ronald Bond was last seen on 11/08/2019.  No major changes.  PVCs were improved on carvedilol.  Lipids controlled by PCP prescribed statin.  Blood pressure control.  This is old amlodipine low-dose carvedilol along with max dose losartan.  Unable to tartrate beta-blocker more because of bradycardia.  Recent Hospitalizations: N/A  Reviewed  CV  studies:    The following studies were reviewed today: (if available, images/films reviewed: From Epic Chart or Care Everywhere)  N/A:   Interval History:   Ronald Bond is here today for annual follow-up initially he mentioned having exertional dyspnea with some occasional episodes of chest discomfort, then the more we discussed it he changed a little bit to saying that he had more exercise intolerance and fatigue.  A little more short of breath than usual with activity.  Still has the sharp fleeting type chest discomfort off and on fleeting and not in a sustained location.  Not associate with other symptoms.  Basically feels a little more like fatigue and exercise intolerance as opposed to true dyspnea exertion.  Not with routine activity just when he is "active"--this was for instance walking about 5 stairs.  He feels like he is getting older  Palpitations stable at present.  No lightheadedness or dizziness, syncope or near syncope.  He says his blood pressures have been going up, and he has intermittent edema.  He is trying to walk about 40 minutes 6 to 7 days a week.  CV Review of Symptoms (Summary) Cardiovascular ROS: positive for - palpitations, rapid heart rate and Some mild exertional dyspnea with exercise intolerance.  Atypical sounding mild chest pain symptoms with a little bit shortness of breath. negative for - orthopnea, paroxysmal nocturnal dyspnea, rapid heart rate, shortness of breath or Syncope or near syncope or TIA/amaurosis fugax or claudication  The patient does not have symptoms concerning for COVID-19 infection (fever, chills, cough, or new shortness  of breath).   REVIEWED OF SYSTEMS   Review of Systems  Constitutional: Negative for malaise/fatigue (Maybe does little exercise intolerance.) and weight loss.  HENT: Negative for congestion.   Respiratory: Negative for cough and shortness of breath.   Cardiovascular: Negative for claudication (Just his legs give  out.).  Gastrointestinal: Negative for abdominal pain, blood in stool and melena.  Genitourinary: Negative for hematuria.  Musculoskeletal: Positive for joint pain. Negative for falls.  Neurological: Negative for dizziness, focal weakness and weakness.  Psychiatric/Behavioral: Negative for depression.   I have reviewed and (if needed) personally updated the patient's problem list, medications, allergies, past medical and surgical history, social and family history.   PAST MEDICAL HISTORY   Past Medical History:  Diagnosis Date   GERD (gastroesophageal reflux disease)    HTN (hypertension)    Hyperlipidemia    Hypothyroidism    Osteoarthritis    Wears glasses     PAST SURGICAL HISTORY   Past Surgical History:  Procedure Laterality Date   COLONOSCOPY     EYE SURGERY     both cataracts   MICROLARYNGOSCOPY Left 11/18/2013   Procedure: MICROLARYNGOSCOPY WITH REMOVAL OF GRANULOMA LEFT SIDE;  Surgeon: Izora Gala, MD;  Location: [facility];  Service: ENT;  Laterality: Left;   NM MYOVIEW LTD  06/2007   Low risk, normal.  No evidence of ischemia or infarction   THYROIDECTOMY  1974   TONSILLECTOMY     TOTAL KNEE ARTHROPLASTY  2009   left    Immunization History  Administered Date(s) Administered   Fluad Quad(high Dose 65+) 07/27/2019   Influenza Split 10/24/2011   Influenza Whole 08/19/2009, 07/23/2010   Influenza, High Dose Seasonal PF 10/21/2013, 09/27/2016, 09/07/2017, 09/14/2018   Influenza,inj,Quad PF,6+ Mos 08/07/2014, 08/25/2015   Influenza-Unspecified 08/28/2020   PFIZER(Purple Top)SARS-COV-2 Vaccination 01/27/2020, 02/25/2020, 08/28/2020   Pneumococcal Conjugate-13 08/25/2015   Pneumococcal Polysaccharide-23 11/28/2004, 03/08/2016   Td 11/28/2004   Tdap 08/25/2015   Zoster 02/24/2011   Zoster Recombinat (Shingrix) 08/23/2019    MEDICATIONS/ALLERGIES   Current Meds  Medication Sig   amLODipine (NORVASC) 10 MG tablet  TAKE 1 TABLET BY MOUTH  DAILY   carvedilol (COREG) 6.25 MG tablet Take 1 tablet (6.25 mg total) by mouth 2 (two) times daily.   fluticasone (FLONASE) 50 MCG/ACT nasal spray Place 2 sprays into both nostrils daily.   indapamide (LOZOL) 1.25 MG tablet TAKE 1 TABLET (1.25 MG TOTAL) BY MOUTH DAILY.   KLOR-CON M20 20 MEQ tablet TAKE 1 TABLET BY MOUTH TWICE A DAY   levothyroxine (SYNTHROID) 112 MCG tablet Take 1 tablet (112 mcg total) by mouth daily.   losartan (COZAAR) 100 MG tablet Take 1 tablet (100 mg total) by mouth daily.   mometasone-formoterol (DULERA) 100-5 MCG/ACT AERO Inhale 2 puffs into the lungs 2 (two) times daily. (Patient taking differently: Inhale 2 puffs into the lungs 2 (two) times daily as needed.)   Multiple Vitamin (MULTIVITAMIN) tablet Take 1 tablet by mouth daily.   tamsulosin (FLOMAX) 0.4 MG CAPS capsule TAKE 1 CAPSULE BY MOUTH  DAILY AFTER SUPPER   [DISCONTINUED] atorvastatin (LIPITOR) 40 MG tablet TAKE 1 TABLET (40 MG TOTAL) BY MOUTH DAILY.    Allergies  Allergen Reactions   Lisinopril Other (See Comments)    Edema in face    SOCIAL HISTORY/FAMILY HISTORY   Reviewed in Epic:  Pertinent findings:  Social History   Tobacco Use   Smoking status: Never Smoker   Smokeless tobacco: Never Used  Vaping  Use   Vaping Use: Never used  Substance Use Topics   Alcohol use: Yes    Comment: occassionally   Drug use: No   Social History   Social History Narrative   Regular Exercise -  YES          OBJCTIVE -PE, EKG, labs   Wt Readings from Last 3 Encounters:  12/21/20 220 lb 3.2 oz (99.9 kg)  11/30/20 217 lb (98.4 kg)  09/18/20 214 lb 12.8 oz (97.4 kg)    Physical Exam: BP (!) 150/82    Pulse 75    Ht 5\' 10"  (1.778 m)    Wt 220 lb 3.2 oz (99.9 kg)    SpO2 96%    BMI 31.60 kg/m  Physical Exam Constitutional:      General: He is not in acute distress.    Appearance: Normal appearance. He is obese. He is not ill-appearing or toxic-appearing.   HENT:     Head: Normocephalic and atraumatic.  Neck:     Vascular: No carotid bruit.  Cardiovascular:     Rate and Rhythm: Normal rate and regular rhythm.     Pulses: Normal pulses.     Heart sounds: Normal heart sounds. No murmur heard. No friction rub. No gallop.   Pulmonary:     Effort: Pulmonary effort is normal. No respiratory distress.     Breath sounds: Normal breath sounds.  Chest:     Chest wall: No tenderness.  Musculoskeletal:        General: Swelling (Trace bilateral ankle) present. Normal range of motion.     Cervical back: Normal range of motion and neck supple.  Neurological:     General: No focal deficit present.     Mental Status: He is alert and oriented to person, place, and time. Mental status is at baseline.  Psychiatric:        Mood and Affect: Mood normal.        Behavior: Behavior normal.        Thought Content: Thought content normal.        Judgment: Judgment normal.      Adult ECG Report Not checked  Recent Labs: Reviewed Lab Results  Component Value Date   CHOL 171 11/30/2020   HDL 63.70 11/30/2020   LDLCALC 89 11/30/2020   LDLDIRECT 75.0 03/08/2016   TRIG 95.0 11/30/2020   CHOLHDL 3 11/30/2020   Lab Results  Component Value Date   CREATININE 1.52 (H) 11/30/2020   BUN 15 11/30/2020   NA 138 11/30/2020   K 4.0 11/30/2020   CL 100 11/30/2020   CO2 29 11/30/2020   CBC Latest Ref Rng & Units 11/30/2020 05/12/2020 12/11/2019  WBC 4.0 - 10.5 K/uL 7.3 6.9 8.0  Hemoglobin 13.0 - 17.0 g/dL 12.9(L) 11.5(L) 12.3(L)  Hematocrit 39.0 - 52.0 % 38.5(L) 34.0(L) 36.8(L)  Platelets 150.0 - 400.0 K/uL 261.0 255.0 271.0    Lab Results  Component Value Date   TSH 3.64 11/30/2020    ==================================================  COVID-19 Education: The signs and symptoms of COVID-19 were discussed with the patient and how to seek care for testing (follow up with PCP or arrange E-visit).   The importance of social distancing and COVID-19  vaccination was discussed today. The patient is practicing social distancing & Masking.   I spent a total of 62minutes with the patient spent in direct patient consultation.  Additional time spent with chart review  / charting (studies, outside notes, etc): 14 min Total Time: 32  min   Current medicines are reviewed at length with the patient today.  (+/- concerns) n/a  This visit occurred during the SARS-CoV-2 public health emergency.  Safety protocols were in place, including screening questions prior to the visit, additional usage of staff PPE, and extensive cleaning of exam room while observing appropriate contact time as indicated for disinfecting solutions.  Notice: This dictation was prepared with Dragon dictation along with smaller phrase technology. Any transcriptional errors that result from this process are unintentional and may not be corrected upon review.  Patient Instructions / Medication Changes & Studies & Tests Ordered   Patient Instructions  Medication Instructions:   You may take 2 tablets of  Carvedilol 3.125 mg twice a day to finish current bottle then   Carvedilol to 6.25 mg twice a day - new refill sent to mail ordere *If you need a refill on your cardiac medications before your next appointment, please call your pharmacy*  Other Instructions Notify the office if  More active chest discomfort or shortness of breath with and without activity.  Lab Work:  Not needed   Testing/Procedures: Not needed   Follow-Up: At [facility], you and your health needs are our priority.  As part of our continuing mission to provide you with exceptional heart care, we have created designated Provider Care Teams.  These Care Teams include your primary Cardiologist (physician) and Advanced Practice Providers (APPs -  Physician Assistants and Nurse Practitioners) who all work together to provide you with the care you need, when you need it.     Your next appointment:   6  month(s)  The format for your next appointment:   In Person  Provider:   Glenetta Hew, MD       Studies Ordered:   No orders of the defined types were placed in this encounter.    Glenetta Hew, M.D., M.S. Interventional Cardiologist   Pager # [phone] Phone # [phone] [address] [place], [place] [address]   Thank you for choosing Heartcare at [facility]!!

## 2020-12-22 ENCOUNTER — Other Ambulatory Visit: Payer: Self-pay | Admitting: Internal Medicine

## 2020-12-22 DIAGNOSIS — E785 Hyperlipidemia, unspecified: Secondary | ICD-10-CM

## 2020-12-23 ENCOUNTER — Encounter: Payer: Self-pay | Admitting: Internal Medicine

## 2020-12-24 ENCOUNTER — Encounter: Payer: Self-pay | Admitting: Cardiology

## 2020-12-24 MED ORDER — CARVEDILOL 6.25 MG PO TABS: 6.2500 mg | ORAL_TABLET | Freq: Two times a day (BID) | ORAL | 3 refills | Status: DC

## 2020-12-24 NOTE — Assessment & Plan Note (Signed)
Relatively recent labs show that the LDL is not quite where he wanted to be.  Up to 89, still relatively stable.  Continue current dose of his atorvastatin

## 2020-12-24 NOTE — Assessment & Plan Note (Signed)
He tolerates examination blood pressure.  Relatively euvolemic on current meds. Plan: Increase carvedilol to 6.25 mg twice daily.  Continue additional blood pressure control with amlodipine and losartan max dose.  Also on indapamide with K+ supplement.  Symptomatically seems like NYHA class II-III.

## 2020-12-24 NOTE — Assessment & Plan Note (Signed)
Blood pressure stable.  Kidney function little worse.  Want to watch closely.  Increasing carvedilol to 6.25 mg labile.  This is along with max dose amlodipine and losartan.

## 2021-01-04 MED FILL — LEVOTHYROXINE SODIUM 112 MC: 112 | 90 days supply | Qty: 90 | Fill #0

## 2021-01-11 DIAGNOSIS — M1990 Unspecified osteoarthritis, unspecified site: Secondary | ICD-10-CM | POA: Diagnosis not present

## 2021-01-11 DIAGNOSIS — Z791 Long term (current) use of non-steroidal anti-inflammatories (NSAID): Secondary | ICD-10-CM | POA: Diagnosis not present

## 2021-01-11 DIAGNOSIS — I10 Essential (primary) hypertension: Secondary | ICD-10-CM | POA: Diagnosis not present

## 2021-01-11 DIAGNOSIS — Z6831 Body mass index (BMI) 31.0-31.9, adult: Secondary | ICD-10-CM | POA: Diagnosis not present

## 2021-01-11 DIAGNOSIS — N400 Enlarged prostate without lower urinary tract symptoms: Secondary | ICD-10-CM | POA: Diagnosis not present

## 2021-01-11 DIAGNOSIS — E669 Obesity, unspecified: Secondary | ICD-10-CM | POA: Diagnosis not present

## 2021-01-11 DIAGNOSIS — E039 Hypothyroidism, unspecified: Secondary | ICD-10-CM | POA: Diagnosis not present

## 2021-01-11 DIAGNOSIS — Z79899 Other long term (current) drug therapy: Secondary | ICD-10-CM | POA: Diagnosis not present

## 2021-01-11 DIAGNOSIS — J309 Allergic rhinitis, unspecified: Secondary | ICD-10-CM | POA: Diagnosis not present

## 2021-01-11 DIAGNOSIS — E785 Hyperlipidemia, unspecified: Secondary | ICD-10-CM | POA: Diagnosis not present

## 2021-01-14 ENCOUNTER — Ambulatory Visit: Payer: Medicare Other | Admitting: Internal Medicine

## 2021-01-14 ENCOUNTER — Telehealth: Payer: Self-pay | Admitting: Cardiology

## 2021-01-14 NOTE — Telephone Encounter (Signed)
Last OV and EKG in system faxed to [facility] at [phone].

## 2021-01-14 NOTE — Telephone Encounter (Signed)
Heather from Dr. Glory Rosebush office in [place] is requesting the patient's last OV notes and EKG. Pt is having surgery on Monday and the hospital there is emailing her asking for this information. Please fax to [phone] with ATTN to [facility].

## 2021-01-18 DIAGNOSIS — H02212 Cicatricial lagophthalmos right lower eyelid: Secondary | ICD-10-CM | POA: Diagnosis not present

## 2021-01-18 DIAGNOSIS — E0500 Thyrotoxicosis with diffuse goiter without thyrotoxic crisis or storm: Secondary | ICD-10-CM | POA: Diagnosis not present

## 2021-01-18 DIAGNOSIS — H02532 Eyelid retraction right lower eyelid: Secondary | ICD-10-CM | POA: Diagnosis not present

## 2021-01-18 DIAGNOSIS — H02222 Mechanical lagophthalmos right lower eyelid: Secondary | ICD-10-CM | POA: Diagnosis not present

## 2021-01-18 DIAGNOSIS — H16211 Exposure keratoconjunctivitis, right eye: Secondary | ICD-10-CM | POA: Diagnosis not present

## 2021-01-19 MED ORDER — CARVEDILOL 6.25 MG PO TABS: 6.2500 mg | ORAL_TABLET | Freq: Two times a day (BID) | ORAL | 1 refills | Status: DC

## 2021-02-04 ENCOUNTER — Other Ambulatory Visit: Payer: Self-pay | Admitting: Internal Medicine

## 2021-02-04 DIAGNOSIS — I119 Hypertensive heart disease without heart failure: Secondary | ICD-10-CM

## 2021-02-08 ENCOUNTER — Other Ambulatory Visit: Payer: Self-pay

## 2021-02-08 ENCOUNTER — Encounter: Payer: Self-pay | Admitting: Internal Medicine

## 2021-02-08 ENCOUNTER — Ambulatory Visit: Payer: Medicare HMO | Admitting: Internal Medicine

## 2021-02-08 VITALS — BP 140/84 | HR 56 | Ht 70.0 in | Wt 214.0 lb

## 2021-02-08 DIAGNOSIS — E0500 Thyrotoxicosis with diffuse goiter without thyrotoxic crisis or storm: Secondary | ICD-10-CM

## 2021-02-08 DIAGNOSIS — E890 Postprocedural hypothyroidism: Secondary | ICD-10-CM

## 2021-02-08 MED ORDER — LEVOTHYROXINE SODIUM 112 MCG PO TABS: 112.0000 ug | ORAL_TABLET | Freq: Every day | ORAL | 3 refills | Status: DC

## 2021-02-08 NOTE — Patient Instructions (Addendum)
-   Continue Levothyroxine 112 mcg daily     You are on levothyroxine - which is your thyroid hormone supplement. You MUST take this consistently.  You should take this first thing in the morning on an empty stomach with water. You should not take it with other medications. Wait 66min to 1hr prior to eating. If you are taking any vitamins - please take these in the evening.   If you miss a dose, please take your missed dose the following day (double the dose for that day). You should have a pill box for ONLY levothyroxine on your bedside table to help you remember to take your medications.

## 2021-02-08 NOTE — Progress Notes (Addendum)
Name: Ronald Bond  MRN/ DOB: [id number], 08-25-1946    Age/ Sex: 75 y.o., male     PCP: Ronald Lima, MD   Reason for Endocrinology Evaluation:  Ronald Bond' disease     Initial Endocrinology Clinic Visit: 10/07/2019    PATIENT IDENTIFIER: Ronald Bond is a 75 y.o., male with a past medical history of Graves' disease, HTN, dyslipidemia and CHF. He has followed with [facility] since 10/07/2019 for consultative assistance with management of his Graves' disease  HISTORICAL SUMMARY: The patient was first diagnosed with hyperthyroidism 1974 , requiring total thyroidectomy with benign pathology, but a couple of years later the pt was noted with hyperthyroidism again due tissue regrowth, requiring  RAI ablation , and was on LT-4 replacement until 07/2019 when this was stopped due to a low TSH 0.12 uIU/mL   But the patient restarted his L T4 replacement weeks later due to palpitation and abnormal sensation.  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  He was treated with prednisone, without any improvement in his vision.  He was unable to obtain the [place] due to shortage per patient.  He is S/P radiation therapy x3  in [place]  In 2021   He normally sees Dr. Katy Bond locally and has an appointment to see him today   Mother with graves' disease    SUBJECTIVE:   Today (02/08/2021):  Ronald Bond is here for follow-up on Graves' disease.      He completed radiation therapy for Grave's orbitopathy in [place].  S/P right eye sx 05/2020 and 01/2021  Denies dizziness   Denies constipation or diarrhea  Weight has been stable    Has been taking Levothyroxine 112 mcg daily regularly, denies missing any doses.    HISTORY:  Past Medical History:  Past Medical History:  Diagnosis Date  . GERD (gastroesophageal reflux disease)   . HTN (hypertension)   . Hyperlipidemia   . Hypothyroidism   . Osteoarthritis   . Wears glasses    Past Surgical History:  Past Surgical History:  Procedure Laterality Date  . COLONOSCOPY    . EYE SURGERY     both cataracts  . MICROLARYNGOSCOPY Left 11/18/2013   Procedure: MICROLARYNGOSCOPY WITH REMOVAL OF GRANULOMA LEFT SIDE;  Surgeon: Izora Gala, MD;  Location: [facility];  Service: ENT;  Laterality: Left;  . NM MYOVIEW LTD  06/2007   Low risk, normal.  No evidence of ischemia or infarction  . THYROIDECTOMY  1974  . TONSILLECTOMY    . TOTAL KNEE ARTHROPLASTY  2009   left    Social History:  reports that he has never smoked. He has never used smokeless tobacco. He reports current alcohol use. He reports that he does not use drugs. Family History:  Family History  Problem Relation Age of Onset  . Brain cancer Mother   . Heart disease Father   . Heart disease Brother      HOME MEDICATIONS: Allergies as of 02/08/2021      Reactions   Lisinopril Other (See Comments)   Edema in face  Medication List       Accurate as of February 08, 2021 11:12 AM. If you have any questions, ask your nurse or doctor.        amLODipine 10 MG tablet Commonly known as: NORVASC TAKE 1 TABLET BY MOUTH  DAILY   atorvastatin 40 MG tablet Commonly known as: LIPITOR TAKE 1 TABLET BY MOUTH EVERY DAY   carvedilol 6.25 MG tablet Commonly known as: COREG Take 1 tablet (6.25 mg total) by mouth 2 (two) times daily.   fluticasone 50 MCG/ACT nasal spray Commonly known as: FLONASE Place 2 sprays into both nostrils daily.   indapamide 1.25 MG tablet Commonly known as: LOZOL TAKE 1 TABLET BY MOUTH DAILY.    Klor-Con M20 20 MEQ tablet Generic drug: potassium chloride SA TAKE 1 TABLET BY MOUTH TWICE A DAY   levothyroxine 112 MCG tablet Commonly known as: SYNTHROID Take 1 tablet (112 mcg total) by mouth daily.   losartan 100 MG tablet Commonly known as: COZAAR Take 1 tablet (100 mg total) by mouth daily.   mometasone-formoterol 100-5 MCG/ACT Aero Commonly known as: DULERA Inhale 2 puffs into the lungs 2 (two) times daily. What changed:   when to take this  reasons to take this   multivitamin tablet Take 1 tablet by mouth daily.   tamsulosin 0.4 MG Caps capsule Commonly known as: FLOMAX TAKE 1 CAPSULE BY MOUTH  DAILY AFTER SUPPER         OBJECTIVE:   PHYSICAL EXAM: VS: BP 140/84   Pulse (!) 56   Ht 5\' 10"  (1.778 m)   Wt 214 lb (97.1 kg)   SpO2 98%   BMI 30.71 kg/m    EXAM: General: Pt appears well and is in NAD  Eyes: External eye exam shows improved bilateral  proptosis  Neck: General: Supple without adenopathy. Thyroid: No goiter or nodules appreciated.   Lungs: Clear with good BS bilat with no rales, rhonchi, or wheezes  Heart: Auscultation: RRR.  Extremities:  BL LE: No pretibial edema normal ROM and strength.  Mental Status: Judgment, insight: Intact Memory: Intact for recent and remote events Mood and affect: No depression, anxiety, or agitation     DATA REVIEWED: Results for ABRAHAN, Bond" (MRN [id number]) as of 02/08/2021 11:11  Ref. Range 07/13/2020 09:51 11/30/2020 10:23  TSH Latest Ref Range: 0.35 - 4.50 uIU/mL 2.16 3.64     07/2019 TSI : 474 (H)     ASSESSMENT / PLAN / RECOMMENDATIONS:   1. Graves' disease, status post thyroidectomy followed by radioactive iodine ablation:  -The patient is clinically euthyroid - No local neck symptoms  - Pt educated extensively on the correct way to take levothyroxine (first thing in the morning with water, 30 minutes before eating or taking other medications). - Pt encouraged to  double dose the following day if she were to miss a dose given long half-life of levothyroxine.     Medications   Continue Levothyroxine 112 MCG daily  2. Graves' Orbitopathy :   - S/P radiation therapy, prednisone therapy and recently sx on the right eye - Diplopia resolved - We did discuss that graves' disease could attack the eyes even with euthyroid status and he needs to continue to be under the care of ophthalmology.    3. Bradycardia:  - Initial HR 33 bpm, repeat check was 56 bpm. Pt is asymptomatic and states this is chronic  - Will defer further management to Cardiology     Follow-up in  1 yr        Signed electronically by: Mack Guise, MD  [facility] Endocrinology  [facility] [address] [place], [place] [address] Phone: [phone] FAX: [phone]      CC: Ronald Lima, MD [place] [address] Phone: [phone]  Fax: [phone]   Return to Endocrinology clinic as below: Future Appointments  Date Time Provider [place]  02/14/2022  7:30 AM Shamleffer, Melanie Crazier, MD [facility] None

## 2021-02-09 ENCOUNTER — Other Ambulatory Visit: Payer: Self-pay | Admitting: Internal Medicine

## 2021-02-09 DIAGNOSIS — T502X5A Adverse effect of carbonic-anhydrase inhibitors, benzothiadiazides and other diuretics, initial encounter: Secondary | ICD-10-CM

## 2021-02-09 DIAGNOSIS — E876 Hypokalemia: Secondary | ICD-10-CM

## 2021-02-09 DIAGNOSIS — I10 Essential (primary) hypertension: Secondary | ICD-10-CM

## 2021-02-10 ENCOUNTER — Other Ambulatory Visit: Payer: Self-pay | Admitting: Cardiology

## 2021-03-01 DIAGNOSIS — R350 Frequency of micturition: Secondary | ICD-10-CM | POA: Diagnosis not present

## 2021-03-01 DIAGNOSIS — N5201 Erectile dysfunction due to arterial insufficiency: Secondary | ICD-10-CM | POA: Diagnosis not present

## 2021-03-01 DIAGNOSIS — R351 Nocturia: Secondary | ICD-10-CM | POA: Diagnosis not present

## 2021-03-01 DIAGNOSIS — N401 Enlarged prostate with lower urinary tract symptoms: Secondary | ICD-10-CM | POA: Diagnosis not present

## 2021-03-06 ENCOUNTER — Other Ambulatory Visit: Payer: Self-pay | Admitting: Cardiology

## 2021-05-04 ENCOUNTER — Other Ambulatory Visit: Payer: Self-pay | Admitting: Internal Medicine

## 2021-05-04 DIAGNOSIS — I119 Hypertensive heart disease without heart failure: Secondary | ICD-10-CM

## 2021-05-11 ENCOUNTER — Telehealth: Payer: Self-pay | Admitting: Internal Medicine

## 2021-05-11 NOTE — Chronic Care Management (AMB) (Signed)
  Chronic Care Management   Note  05/11/2021 Name: Ronald Bond MRN: [id number] DOB: [birth year]  Ronald Bond is a 75 y.o. year old male who is a primary care patient of Janith Lima, MD. I reached out to Ronald Bond by phone today in response to a referral sent by Ronald Bond PCP, Janith Lima, MD.   Ronald Bond was given information about Chronic Care Management services today including:  CCM service includes personalized support from designated clinical staff supervised by his physician, including individualized plan of care and coordination with other care providers 24/7 contact phone numbers for assistance for urgent and routine care needs. Service will only be billed when office clinical staff spend 20 minutes or more in a month to coordinate care. Only one practitioner may furnish and bill the service in a calendar month. The patient may stop CCM services at any time (effective at the end of the month) by phone call to the office staff.   Patient agreed to services and verbal consent obtained.   Follow up plan:   Lauretta Grill [facility] Scheduler

## 2021-05-17 ENCOUNTER — Other Ambulatory Visit: Payer: Self-pay | Admitting: Internal Medicine

## 2021-05-17 ENCOUNTER — Ambulatory Visit: Payer: Medicare HMO | Admitting: Internal Medicine

## 2021-05-17 DIAGNOSIS — T502X5A Adverse effect of carbonic-anhydrase inhibitors, benzothiadiazides and other diuretics, initial encounter: Secondary | ICD-10-CM

## 2021-05-17 DIAGNOSIS — I10 Essential (primary) hypertension: Secondary | ICD-10-CM

## 2021-05-17 DIAGNOSIS — E876 Hypokalemia: Secondary | ICD-10-CM

## 2021-06-09 ENCOUNTER — Other Ambulatory Visit (HOSPITAL_BASED_OUTPATIENT_CLINIC_OR_DEPARTMENT_OTHER): Payer: Self-pay

## 2021-06-09 ENCOUNTER — Telehealth: Payer: Self-pay | Admitting: Internal Medicine

## 2021-06-09 NOTE — Telephone Encounter (Signed)
1.Medication Requested: losartan (COZAAR) 100 MG tablet  amLODipine (NORVASC) 10 MG tablet  2. Pharmacy (Name, Street, [place]): [id number] - [place], [place]  3. On Med List: yes   4. Last Visit with PCP: 11-30-20  5. Next visit date with PCP: n/a    Agent: Please be advised that RX refills may take up to 3 business days. We ask that you follow-up with your pharmacy.

## 2021-06-10 ENCOUNTER — Other Ambulatory Visit: Payer: Self-pay | Admitting: Internal Medicine

## 2021-06-10 DIAGNOSIS — E118 Type 2 diabetes mellitus with unspecified complications: Secondary | ICD-10-CM

## 2021-06-10 DIAGNOSIS — I10 Essential (primary) hypertension: Secondary | ICD-10-CM

## 2021-06-10 DIAGNOSIS — I119 Hypertensive heart disease without heart failure: Secondary | ICD-10-CM

## 2021-06-10 MED ORDER — LOSARTAN POTASSIUM 100 MG PO TABS: 100.0000 mg | ORAL_TABLET | Freq: Every day | ORAL | 0 refills | Status: DC

## 2021-06-10 MED ORDER — AMLODIPINE BESYLATE 10 MG PO TABS: 10.0000 mg | ORAL_TABLET | Freq: Every day | ORAL | 0 refills | Status: DC

## 2021-06-10 NOTE — Telephone Encounter (Signed)
Pt would like to know what he is suppose to do between now and his OV on 7/25. What is he suppose to do if he has a complication when he is suppose to take his medication daily. Please advise.

## 2021-06-21 ENCOUNTER — Ambulatory Visit (INDEPENDENT_AMBULATORY_CARE_PROVIDER_SITE_OTHER): Payer: Medicare HMO | Admitting: Internal Medicine

## 2021-06-21 ENCOUNTER — Encounter: Payer: Self-pay | Admitting: Internal Medicine

## 2021-06-21 ENCOUNTER — Other Ambulatory Visit: Payer: Self-pay

## 2021-06-21 VITALS — BP 170/80 | HR 47 | Temp 98.2°F | Resp 16 | Ht 70.0 in | Wt 217.0 lb

## 2021-06-21 DIAGNOSIS — Z0001 Encounter for general adult medical examination with abnormal findings: Secondary | ICD-10-CM | POA: Diagnosis not present

## 2021-06-21 DIAGNOSIS — E519 Thiamine deficiency, unspecified: Secondary | ICD-10-CM | POA: Diagnosis not present

## 2021-06-21 DIAGNOSIS — E890 Postprocedural hypothyroidism: Secondary | ICD-10-CM | POA: Diagnosis not present

## 2021-06-21 DIAGNOSIS — R001 Bradycardia, unspecified: Secondary | ICD-10-CM | POA: Diagnosis not present

## 2021-06-21 DIAGNOSIS — D508 Other iron deficiency anemias: Secondary | ICD-10-CM | POA: Diagnosis not present

## 2021-06-21 DIAGNOSIS — N1832 Chronic kidney disease, stage 3b: Secondary | ICD-10-CM

## 2021-06-21 DIAGNOSIS — K2100 Gastro-esophageal reflux disease with esophagitis, without bleeding: Secondary | ICD-10-CM | POA: Diagnosis not present

## 2021-06-21 DIAGNOSIS — I119 Hypertensive heart disease without heart failure: Secondary | ICD-10-CM | POA: Diagnosis not present

## 2021-06-21 DIAGNOSIS — R1310 Dysphagia, unspecified: Secondary | ICD-10-CM | POA: Insufficient documentation

## 2021-06-21 DIAGNOSIS — Z23 Encounter for immunization: Secondary | ICD-10-CM

## 2021-06-21 DIAGNOSIS — E118 Type 2 diabetes mellitus with unspecified complications: Secondary | ICD-10-CM | POA: Diagnosis not present

## 2021-06-21 LAB — HEMOGLOBIN A1C: Hgb A1c MFr Bld: 6.5 % (ref 4.6–6.5)

## 2021-06-21 LAB — URINALYSIS, ROUTINE W REFLEX MICROSCOPIC
Bilirubin Urine: NEGATIVE
Hgb urine dipstick: NEGATIVE
Ketones, ur: NEGATIVE
Leukocytes,Ua: NEGATIVE
Nitrite: NEGATIVE
RBC / HPF: NONE SEEN (ref 0–?)
Specific Gravity, Urine: 1.02 (ref 1.000–1.030)
Total Protein, Urine: NEGATIVE
Urine Glucose: NEGATIVE
Urobilinogen, UA: 0.2 (ref 0.0–1.0)
pH: 6 (ref 5.0–8.0)

## 2021-06-21 LAB — BASIC METABOLIC PANEL
BUN: 18 mg/dL (ref 6–23)
CO2: 29 mEq/L (ref 19–32)
Calcium: 9.5 mg/dL (ref 8.4–10.5)
Chloride: 102 mEq/L (ref 96–112)
Creatinine, Ser: 1.48 mg/dL (ref 0.40–1.50)
GFR: 45.98 mL/min — ABNORMAL LOW (ref >60.00)
Glucose, Bld: 108 mg/dL — ABNORMAL HIGH (ref 70–99)
Potassium: 3.5 mEq/L (ref 3.5–5.1)
Sodium: 139 mEq/L (ref 135–145)

## 2021-06-21 LAB — CBC WITH DIFFERENTIAL/PLATELET
Basophils Absolute: 0.1 10*3/uL (ref 0.0–0.1)
Basophils Relative: 1 % (ref 0.0–3.0)
Eosinophils Absolute: 0.4 10*3/uL (ref 0.0–0.7)
Eosinophils Relative: 5.9 % — ABNORMAL HIGH (ref 0.0–5.0)
HCT: 37 % — ABNORMAL LOW (ref 39.0–52.0)
Hemoglobin: 12.3 g/dL — ABNORMAL LOW (ref 13.0–17.0)
Lymphocytes Relative: 22.9 % (ref 12.0–46.0)
Lymphs Abs: 1.6 10*3/uL (ref 0.7–4.0)
MCHC: 33.2 g/dL (ref 30.0–36.0)
MCV: 90.7 fl (ref 78.0–100.0)
Monocytes Absolute: 0.6 10*3/uL (ref 0.1–1.0)
Monocytes Relative: 8.7 % (ref 3.0–12.0)
Neutro Abs: 4.4 10*3/uL (ref 1.4–7.7)
Neutrophils Relative %: 61.5 % (ref 43.0–77.0)
Platelets: 244 10*3/uL (ref 150.0–400.0)
RBC: 4.08 Mil/uL — ABNORMAL LOW (ref 4.22–5.81)
RDW: 14.6 % (ref 11.5–15.5)
WBC: 7.1 10*3/uL (ref 4.0–10.5)

## 2021-06-21 LAB — TSH: TSH: 1.25 u[IU]/mL (ref 0.35–5.50)

## 2021-06-21 LAB — IRON: Iron: 69 ug/dL (ref 42–165)

## 2021-06-21 LAB — FERRITIN: Ferritin: 122.9 ng/mL (ref 22.0–322.0)

## 2021-06-21 MED ORDER — INDAPAMIDE 1.25 MG PO TABS
1.2500 mg | ORAL_TABLET | Freq: Every day | ORAL | 0 refills | Status: DC
Start: 2021-06-21 — End: 2021-07-05

## 2021-06-21 MED ORDER — DEXLANSOPRAZOLE 60 MG PO CPDR
60.0000 mg | DELAYED_RELEASE_CAPSULE | Freq: Every day | ORAL | 1 refills | Status: DC
Start: 2021-06-21 — End: 2021-07-05

## 2021-06-21 NOTE — Patient Instructions (Signed)

## 2021-06-21 NOTE — Progress Notes (Signed)
Subjective:  Patient ID: Ronald Bond, male    DOB: 23-May-1946  Age: 75 y.o. MRN: [id number]  CC: Anemia, Diabetes, Hypertension, and Annual Exam  This visit occurred during the SARS-CoV-2 public health emergency.  Safety protocols were in place, including screening questions prior to the visit, additional usage of staff PPE, and extensive cleaning of exam room while observing appropriate contact time as indicated for disinfecting solutions.    HPI Ronald Bond presents for a CPX and f/up -   He can climb a flight of stairs without experiencing dyspnea on exertion.  He does not think he is taking indapamide.  He thinks he is taking his other antihypertensives.  He has rare orthostatic dizziness.  He denies chest pain, presyncope, lightheadedness, abdominal pain, edema, or fatigue.  He has developed dysphagia and odynophagia with meds.  He has heartburn and is not getting much symptom relief with the current PPI.  He denies loss of appetite or weight loss.  Outpatient Medications Prior to Visit  Medication Sig Dispense Refill   amLODipine (NORVASC) 10 MG tablet Take 1 tablet (10 mg total) by mouth daily. 90 tablet 0   atorvastatin (LIPITOR) 40 MG tablet TAKE 1 TABLET BY MOUTH EVERY DAY 90 tablet 1   carvedilol (COREG) 6.25 MG tablet TAKE 1 TABLET BY MOUTH TWICE A DAY 60 tablet 11   erythromycin ophthalmic ointment APPLY 1 SMALL AMOUNT INTO BOTH EYES AT BEDTIME 3.5 g 3   fluticasone (FLONASE) 50 MCG/ACT nasal spray Place 2 sprays into both nostrils daily. 48 g 1   KLOR-CON M20 20 MEQ tablet TAKE 1 TABLET BY MOUTH TWICE A DAY 180 tablet 0   levothyroxine (SYNTHROID) 112 MCG tablet Take 1 tablet (112 mcg total) by mouth daily. 90 tablet 3   losartan (COZAAR) 100 MG tablet Take 1 tablet (100 mg total) by mouth daily. 90 tablet 0   mometasone-formoterol (DULERA) 100-5 MCG/ACT AERO Inhale 2 puffs into the lungs 2 (two) times daily. (Patient taking differently: Inhale 2 puffs into the lungs 2  (two) times daily as needed.) 13 g 11   Multiple Vitamin (MULTIVITAMIN) tablet Take 1 tablet by mouth daily.     tamsulosin (FLOMAX) 0.4 MG CAPS capsule TAKE 1 CAPSULE BY MOUTH  DAILY AFTER SUPPER 90 capsule 1   indapamide (LOZOL) 1.25 MG tablet TAKE 1 TABLET BY MOUTH EVERY DAY 90 tablet 0   omeprazole (PRILOSEC) 20 MG capsule Take 20 mg by mouth daily.     No facility-administered medications prior to visit.    ROS Review of Systems  Constitutional: Negative.  Negative for chills, diaphoresis and fatigue.  HENT:  Positive for trouble swallowing.   Eyes: Negative.   Respiratory:  Negative for cough, chest tightness and shortness of breath.   Cardiovascular:  Negative for chest pain, palpitations and leg swelling.  Gastrointestinal:  Negative for abdominal pain, constipation, diarrhea, nausea and vomiting.  Endocrine: Negative.   Genitourinary: Negative.  Negative for difficulty urinating and dysuria.  Musculoskeletal:  Positive for arthralgias. Negative for myalgias.  Skin: Negative.   Allergic/Immunologic: Negative.   Neurological: Negative.  Negative for dizziness, weakness, light-headedness and headaches.  Hematological:  Negative for adenopathy. Does not bruise/bleed easily.  Psychiatric/Behavioral: Negative.     Objective:  BP (!) 170/80 (BP Location: Left Arm, Patient Position: Sitting, Cuff Size: Large)   Pulse (!) 47   Temp 98.2 F (36.8 C) (Oral)   Resp 16   Ht '5\' 10"'$  (1.778 m)  Wt 217 lb (98.4 kg)   SpO2 100%   BMI 31.14 kg/m   BP Readings from Last 3 Encounters:  06/21/21 (!) 170/80  02/08/21 140/84  12/21/20 (!) 150/82    Wt Readings from Last 3 Encounters:  06/21/21 217 lb (98.4 kg)  02/08/21 214 lb (97.1 kg)  12/21/20 220 lb 3.2 oz (99.9 kg)    Physical Exam Vitals reviewed.  Constitutional:      Appearance: Normal appearance.  HENT:     Nose: Nose normal.     Mouth/Throat:     Mouth: Mucous membranes are moist.  Eyes:     General: No scleral  icterus.    Conjunctiva/sclera: Conjunctivae normal.  Cardiovascular:     Rate and Rhythm: Regular rhythm. Bradycardia present.     Pulses: Normal pulses.     Heart sounds: No murmur heard.    Comments: EKG- Sinus bradycardia, 47 bpm LAD No LVH or Q waves Pulmonary:     Effort: Pulmonary effort is normal.     Breath sounds: No stridor. No wheezing, rhonchi or rales.  Abdominal:     General: Abdomen is flat.     Palpations: There is no mass.     Tenderness: There is no abdominal tenderness. There is no guarding.     Hernia: No hernia is present.  Musculoskeletal:        General: Normal range of motion.     Cervical back: Neck supple.     Right lower leg: No edema.     Left lower leg: No edema.  Lymphadenopathy:     Cervical: No cervical adenopathy.  Skin:    General: Skin is warm and dry.  Neurological:     General: No focal deficit present.     Mental Status: He is alert.  Psychiatric:        Mood and Affect: Mood normal.        Behavior: Behavior normal.    Lab Results  Component Value Date   WBC 7.1 06/21/2021   HGB 12.3 (L) 06/21/2021   HCT 37.0 (L) 06/21/2021   PLT 244.0 06/21/2021   GLUCOSE 108 (H) 06/21/2021   CHOL 171 11/30/2020   TRIG 95.0 11/30/2020   HDL 63.70 11/30/2020   LDLDIRECT 75.0 03/08/2016   LDLCALC 89 11/30/2020   ALT 17 11/30/2020   AST 15 11/30/2020   NA 139 06/21/2021   K 3.5 06/21/2021   CL 102 06/21/2021   CREATININE 1.48 06/21/2021   BUN 18 06/21/2021   CO2 29 06/21/2021   TSH 1.25 06/21/2021   PSA 3.26 11/30/2020   INR 0.9 10/17/2007   HGBA1C 6.5 06/21/2021   MICROALBUR 21.0 (H) 11/30/2020    CT CORONARY FRACTIONAL FLOW RESERVE DATA PREP  Result Date: 06/05/2019 CLINICAL DATA:  Chest pain EXAM: CT FFR MEDICATIONS: No additional medications TECHNIQUE: The coronary CT was sent for FFR. FINDINGS: No modeled stenosis > 30%. IMPRESSION: No evidence for hemodynamically significant coronary disease. Dalton Mclean Electronically Signed    By: Loralie Champagne M.D.   On: 05/28/2019 16:33   CT CORONARY FRACTIONAL FLOW RESERVE FLUID ANALYSIS  Result Date: 06/05/2019 CLINICAL DATA:  Chest pain EXAM: CT FFR MEDICATIONS: No additional medications TECHNIQUE: The coronary CT was sent for FFR. FINDINGS: No modeled stenosis > 30%. IMPRESSION: No evidence for hemodynamically significant coronary disease. Dalton Mclean Electronically Signed   By: Loralie Champagne M.D.   On: 05/28/2019 16:33    Assessment & Plan:   Ronald Bond was seen  today for anemia, diabetes, hypertension and annual exam.  Diagnoses and all orders for this visit:  Postablative hypothyroidism- His TSH is in the normal range.  He will stay on the current dose of levothyroxine. -     TSH; Future -     TSH  Type II diabetes mellitus with manifestations ([place])- His blood sugar is well controlled but he has poorly controlled hypertension and renal insufficiency.  I recommended that he start taking an SGLT2 inhibitor. -     dapagliflozin propanediol (FARXIGA) 10 MG TABS tablet; Take 1 tablet (10 mg total) by mouth daily before breakfast.  Stage 3b chronic kidney disease ([place])- Will add Farxiga for renal protection. -     Urinalysis, Routine w reflex microscopic; Future -     Hemoglobin A1c; Future -     Basic metabolic panel; Future -     Basic metabolic panel -     Hemoglobin A1c -     Urinalysis, Routine w reflex microscopic -     dapagliflozin propanediol (FARXIGA) 10 MG TABS tablet; Take 1 tablet (10 mg total) by mouth daily before breakfast.  Bradycardia- He is asymptomatic with this and tells me he is followed by cardiology.  Will continue the current dose of carvedilol. -     EKG 12-Lead -     TSH; Future -     TSH  Iron deficiency anemia secondary to inadequate dietary iron intake- He remains anemic.  I will monitor his iron level. -     CBC with Differential/Platelet; Future -     Iron; Future -     Ferritin; Future -     Ferritin -     Iron -     CBC with  Differential/Platelet  Thiamine deficiency- Will monitor his B1 level. -     CBC with Differential/Platelet; Future -     Vitamin B1; Future -     Vitamin B1 -     CBC with Differential/Platelet  Gastroesophageal reflux disease with esophagitis without hemorrhage- Will upgrade to a more potent PPI. -     dexlansoprazole (DEXILANT) 60 MG capsule; Take 1 capsule (60 mg total) by mouth daily.  Pill dysphagia -     Ambulatory referral to Gastroenterology  Hypertensive heart disease without CHF (congestive heart failure)- His BP is not well controlled. Will restart lozol.  -     indapamide (LOZOL) 1.25 MG tablet; Take 1 tablet (1.25 mg total) by mouth daily. -     Urinalysis, Routine w reflex microscopic; Future -     Basic metabolic panel; Future -     Basic metabolic panel -     Urinalysis, Routine w reflex microscopic  I have discontinued Ronald Graff "Rayven Elizondo"'s omeprazole. I have also changed his indapamide. Additionally, I am having him start on dexlansoprazole and dapagliflozin propanediol. Lastly, I am having him maintain his mometasone-formoterol, multivitamin, fluticasone, tamsulosin, atorvastatin, levothyroxine, erythromycin, carvedilol, [place]-Con M20, losartan, and amLODipine.  Meds ordered this encounter  Medications   dexlansoprazole (DEXILANT) 60 MG capsule    Sig: Take 1 capsule (60 mg total) by mouth daily.    Dispense:  90 capsule    Refill:  1   indapamide (LOZOL) 1.25 MG tablet    Sig: Take 1 tablet (1.25 mg total) by mouth daily.    Dispense:  90 tablet    Refill:  0   dapagliflozin propanediol (FARXIGA) 10 MG TABS tablet    Sig: Take 1 tablet (10  mg total) by mouth daily before breakfast.    Dispense:  90 tablet    Refill:  1      Follow-up: Return in about 6 weeks (around 08/02/2021).  Scarlette Calico, MD

## 2021-06-22 DIAGNOSIS — Z0001 Encounter for general adult medical examination with abnormal findings: Secondary | ICD-10-CM | POA: Insufficient documentation

## 2021-06-22 MED ORDER — SHINGRIX 50 MCG/0.5ML IM SUSR: 0.5000 mL | Freq: Once | INTRAMUSCULAR | 1 refills | Status: AC

## 2021-06-22 MED ORDER — DAPAGLIFLOZIN PROPANEDIOL 10 MG PO TABS: 10.0000 mg | ORAL_TABLET | Freq: Every day | ORAL | 1 refills | Status: DC

## 2021-06-25 ENCOUNTER — Telehealth: Payer: Self-pay

## 2021-06-25 NOTE — Chronic Care Management (AMB) (Signed)
    Chronic Care Management Pharmacy Assistant   Name: Ronald Bond  MRN: [id number] DOB: [birth year]  Ronald Bond is an 75 y.o. year old male who presents for his initial CCM visit with the clinical pharmacist.   Recent office visits:  06/21/21-Thomas Evalina Field, MD (PCP) Annual exam. Labs ordered. Start on Farxiga 10 mg tabs Take 1 tablet (10 mg total) by mouth daily before breakfast. Restart on Lozol 1.25 mg tab Take 1 tablet (1.25 mg total) by mouth daily. Discontinued omeprazole and start Dexilant 60 mg daily.EKG completed. Ambulatory referral to Gastroenterology. Follow up in 6 weeks.  Recent consult visits:  03/01/21-Stephen Dahlstedt (Urology) Notes not available.  02/08/21-[facility] [place] (Endocrinology) Follow up in 1 year. 01/18/21-Michael Corinda [place] (Ophthalmology) Notes not available. 01/11/21-[facility] [place]. Notes not available. Hospital visits:  None in previous 6 months  Medications: Outpatient Encounter Medications as of 06/25/2021  Medication Sig   amLODipine (NORVASC) 10 MG tablet Take 1 tablet (10 mg total) by mouth daily.   atorvastatin (LIPITOR) 40 MG tablet TAKE 1 TABLET BY MOUTH EVERY DAY   carvedilol (COREG) 6.25 MG tablet TAKE 1 TABLET BY MOUTH TWICE A DAY   dapagliflozin propanediol (FARXIGA) 10 MG TABS tablet Take 1 tablet (10 mg total) by mouth daily before breakfast.   dexlansoprazole (DEXILANT) 60 MG capsule Take 1 capsule (60 mg total) by mouth daily.   erythromycin ophthalmic ointment APPLY 1 SMALL AMOUNT INTO BOTH EYES AT BEDTIME   fluticasone (FLONASE) 50 MCG/ACT nasal spray Place 2 sprays into both nostrils daily.   indapamide (LOZOL) 1.25 MG tablet Take 1 tablet (1.25 mg total) by mouth daily.   KLOR-CON M20 20 MEQ tablet TAKE 1 TABLET BY MOUTH TWICE A DAY   levothyroxine (SYNTHROID) 112 MCG tablet Take 1 tablet (112 mcg total) by mouth daily.   losartan (COZAAR) 100 MG tablet Take 1 tablet (100 mg  total) by mouth daily.   mometasone-formoterol (DULERA) 100-5 MCG/ACT AERO Inhale 2 puffs into the lungs 2 (two) times daily. (Patient taking differently: Inhale 2 puffs into the lungs 2 (two) times daily as needed.)   Multiple Vitamin (MULTIVITAMIN) tablet Take 1 tablet by mouth daily.   tamsulosin (FLOMAX) 0.4 MG CAPS capsule TAKE 1 CAPSULE BY MOUTH  DAILY AFTER SUPPER   No facility-administered encounter medications on file as of 06/25/2021.   AmLODipine (NORVASC) 10 MG tablet Last filled:06/10/21 90 DS Atorvastatin (LIPITOR) 40 MG tablet Last filled:03/28/21 90 DS Carvedilol (COREG) 6.25 MG tablet Last filled:04/17/21 3 DS Dapagliflozin propanediol (FARXIGA) 10 MG TABS tablet Last filled:06/22/21 90 DS Dexlansoprazole (DEXILANT) 60 MG capsule Last filled:06/21/21 30 DS Erythromycin ophthalmic ointment Last filled:09/18/20 7 DS Fluticasone (FLONASE) 50 MCG/ACT nasal spray Last filled:02/04/19 0 DS Indapamide (LOZOL) 1.25 MG tablet Last filled:06/21/21 90 DS KLOR-CON M20 20 MEQ tablet Last filled:05/17/21 90 DS Levothyroxine (SYNTHROID) 112 MCG tablet Last filled:06/14/21 90 DS Losartan (COZAAR) 100 MG tablet Last filled:06/10/21 90 DS Mometasone-formoterol (DULERA) 100-5 MCG/ACT AERO Last filled:None noted Multiple Vitamin (MULTIVITAMIN) tablet Last filled:None noted Tamsulosin (FLOMAX) 0.4 MG CAPS capsule Last filled:11/30/20 90 DS   Star Rating Drugs: Losartan (COZAAR) 100 MG tablet Last filled:06/10/21 90 DS Dapagliflozin propanediol (FARXIGA) 10 MG TABS tablet Last filled:06/22/21 90 DS Atorvastatin (LIPITOR) 40 MG tablet Last filled:03/28/21 90 DS  Myriam Elta [place], [place]

## 2021-06-26 ENCOUNTER — Other Ambulatory Visit: Payer: Self-pay | Admitting: Internal Medicine

## 2021-06-26 DIAGNOSIS — E785 Hyperlipidemia, unspecified: Secondary | ICD-10-CM

## 2021-06-26 LAB — VITAMIN B1: Vitamin B1 (Thiamine): 14 nmol/L (ref 8–30)

## 2021-06-27 ENCOUNTER — Encounter: Payer: Self-pay | Admitting: Internal Medicine

## 2021-07-01 NOTE — Progress Notes (Signed)
Chronic Care Management Pharmacy Note  07/02/2021 Name:  Ronald Bond MRN:  [id number] DOB:  10/28/1946  Summary: - Patients blood pressure in office today was 166/79 with a pulse of 43 BPM, reports that heart rate has always been low, denies any symptoms, patient actively following with cardiology  -Denies any issues or concerns since starting farxiga, reports no issues with cost  -Notes that dexliant is effective in control of acid reflux, but notes that it is a costly medication, felt that omeprazole was effective when he had taken prior  -Reports that asthma has been under control, has not used dulera in a number of months, reports to previous albuterol inhaler use as needed for wheezing  Recommendations/Changes made from today's visit: - Recommending for patient to split losartan dosing to 80m twice daily, patient will start to check blood pressure and HR at home at least 3 times weekly  -Finish current dexilant prescription then replace with omeprazole 463mdaily (lower cost) - Refill albuterol inhaler to use as needed for asthma   Subjective: Ronald HAVLINs an 75 19.o. year old male who is a primary patient of Ronald LimaMD.  The CCM team was consulted for assistance with disease management and care coordination needs.    Engaged with patient face to face for initial visit in response to provider referral for pharmacy case management and/or care coordination services.   Consent to Services:  The patient was given the following information about Chronic Care Management services today, agreed to services, and gave verbal consent: 1. CCM service includes personalized support from designated clinical staff supervised by the primary care provider, including individualized plan of care and coordination with other care providers 2. 24/7 contact phone numbers for assistance for urgent and routine care needs. 3. Service will only be billed when office clinical staff spend 20  minutes or more in a month to coordinate care. 4. Only one practitioner may furnish and bill the service in a calendar month. 5.The patient may stop CCM services at any time (effective at the end of the month) by phone call to the office staff. 6. The patient will be responsible for cost sharing (co-pay) of up to 20% of the service fee (after annual deductible is met). Patient agreed to services and consent obtained.  Patient Care Team: Ronald LimaMD as PCP - General Ronald HackaLeonie GreenMD as PCP - Cardiology (Cardiology) HaLeonie ManMD as Consulting Physician (Cardiology) Ronald GalloMD as Consulting Physician (Urology) Ronald LeverMD as Consulting Physician (Pulmonary Disease) Ronald Bond (Dentistry) Ronald BisonaDarnelle MaffucciRPBrandywine [facility] (Bond)  Recent office visits:  06/21/21-Ronald L.Evalina FieldMD (PCP) Annual exam. Labs ordered. Start on Farxiga 10 mg tabs Take 1 tablet (10 mg total) by mouth daily before, Restart on Lozol 1.25 mg tab Take 1 tablet (1.25 mg total) by mouth daily. Discontinued omeprazole and start Dexilant 60 mg daily.EKG completed. Ambulatory referral to Gastroenterology. Follow up in 6 weeks.   Recent consult visits:  03/01/21-Ronald Bond (Urology) Notes not available. 02/08/21-[facility] Follow up in 1 year. 01/18/21-Ronald [facility]Corinda [facility]lerOphthal[facility]) Notes not available. 01/11/21-[facility] [place]. 12/21/2020 - Ronald Bond - Cardiology -Carvedilol increased to 6.2528m 1 tablet twice daily  Hospital visits:  None in previous 6 months  Objective:  Lab Results  Component Value Date   CREATININE 1.48 06/21/2021   BUN 18 06/21/2021   GFR 45.98 (L)  06/21/2021   GFRNONAA 46 (L) 05/17/2019   GFRAA 53 (L) 05/17/2019   NA 139 06/21/2021   K 3.5 06/21/2021   CALCIUM 9.5 06/21/2021   CO2 29 06/21/2021   GLUCOSE 108 (H) 06/21/2021    Lab Results   Component Value Date/Time   HGBA1C 6.5 06/21/2021 09:52 AM   HGBA1C 6.2 11/30/2020 10:23 AM   HGBA1C 5.9 10/15/2018 09:50 AM   GFR 45.98 (L) 06/21/2021 09:52 AM   GFR 44.70 (L) 11/30/2020 10:23 AM   MICROALBUR 21.0 (H) 11/30/2020 10:23 AM    Last diabetic Eye exam:  Lab Results  Component Value Date/Time   HMDIABEYEEXA No Retinopathy 12/12/2019 12:00 AM    Last diabetic Foot exam:  No results found for: HMDIABFOOTEX   Lab Results  Component Value Date   CHOL 171 11/30/2020   HDL 63.70 11/30/2020   LDLCALC 89 11/30/2020   LDLDIRECT 75.0 03/08/2016   TRIG 95.0 11/30/2020   CHOLHDL 3 11/30/2020    Hepatic Function Latest Ref Rng & Units 11/30/2020 10/15/2018 12/07/2017  Total Protein 6.0 - 8.3 g/dL 7.5 7.5 7.5  Albumin 3.5 - 5.2 g/dL 4.7 4.5 4.3  AST 0 - 37 U/L _0 ALT 0 - 53 U/L _1 Alk Phosphatase 39 - 117 U/L 69 59 70  Total Bilirubin 0.2 - 1.2 mg/dL 0.5 0.5 0.6  Bilirubin, Direct 0.0 - 0.3 mg/dL 0.1 - -    Lab Results  Component Value Date/Time   TSH 1.25 06/21/2021 09:52 AM   TSH 3.64 11/30/2020 10:23 AM   FREET4 1.14 07/13/2020 09:51 AM   FREET4 1.01 01/10/2020 08:43 AM    CBC Latest Ref Rng & Units 06/21/2021 11/30/2020 05/12/2020  WBC 4.0 - 10.5 K/uL 7.1 7.3 6.9  Hemoglobin 13.0 - 17.0 g/dL 12.3(L) 12.9(L) 11.5(L)  Hematocrit 39.0 - 52.0 % 37.0(L) 38.5(L) 34.0(L)  Platelets 150.0 - 400.0 K/uL 244.0 261.0 255.0    Lab Results  Component Value Date/Time   VD25OH 34.37 10/15/2018 10:32 AM    Clinical ASCVD: No  The 10-year ASCVD risk score Mikey Bussing DC Jr., et al., 2013) is: 51.6%   Values used to calculate the score:     Age: 4 years     Sex: Male     Is Non-Hispanic African American: Yes     Diabetic: Yes     Tobacco smoker: No     Systolic Blood Pressure: 371 mmHg     Is BP treated: Yes     HDL Cholesterol: 63.7 mg/dL     Total Cholesterol: 171 mg/dL    Depression screen [facility] 2/9 06/21/2021 09/18/2020 05/12/2020  Decreased Interest 0 0 0   Down, Depressed, Hopeless 0 0 0  PHQ - 2 Score 0 0 0  Some recent data might be hidden     Social History   Tobacco Use  Smoking Status Never  Smokeless Tobacco Never   BP Readings from Last 3 Encounters:  06/21/21 (!) 170/80  02/08/21 140/84  12/21/20 (!) 150/82   Pulse Readings from Last 3 Encounters:  06/21/21 (!) 47  02/08/21 (!) 56  12/21/20 75   Wt Readings from Last 3 Encounters:  06/21/21 217 lb (98.4 kg)  02/08/21 214 lb (97.1 kg)  12/21/20 220 lb 3.2 oz (99.9 kg)   BMI Readings from Last 3 Encounters:  06/21/21 31.14 kg/m  02/08/21 30.71 kg/m  12/21/20 31.60 kg/m    Assessment/Interventions: Review of patient past medical history, allergies, medications, health  status, including review of consultants reports, laboratory and other test data, was performed as part of comprehensive evaluation and provision of chronic care management services.   SDOH:  (Social Determinants of Health) assessments and interventions performed: Yes  SDOH Screenings   Alcohol Screen: Low Risk    Last Alcohol Screening Score (AUDIT): 0  Depression (PHQ2-9): Low Risk    PHQ-2 Score: 0  Financial Resource Strain: Low Risk    Difficulty of Paying Living Expenses: Not hard at all  Food Insecurity: No Food Insecurity   Worried About Charity fundraiser in the Last Year: Never true   Ran Out of Food in the Last Year: Never true  Housing: Low Risk    Last Housing Risk Score: 0  Physical Activity: Sufficiently Active   Days of Exercise per Week: 5 days   Minutes of Exercise per Session: 30 min  Social Connections: Engineer, building services of Communication with Friends and Family: More than three times a week   Frequency of Social Gatherings with Friends and Family: More than three times a week   Attends Religious Services: More than 4 times per year   Active Member of [employer] or Organizations: Yes   Attends Music therapist: More than 4 times per year   Marital  Status: Married  Stress: No Stress Concern Present   Feeling of Stress : Not at all  Tobacco Use: Low Risk    Smoking Tobacco Use: Never   Smokeless Tobacco Use: Never  Transportation Needs: No Transportation Needs   Lack of Transportation (Medical): No   Lack of Transportation (Non-Medical): No    CCM Care Plan  Allergies  Allergen Reactions   Lisinopril Other (See Comments)    Edema in face    Medications Reviewed Today     Reviewed by Tomasa Blase, [facility] (Bond) on 07/02/21 at 1048  Med List Status: <None>   Medication Order Taking? Sig Documenting Provider Last Dose Status Informant  amLODipine (NORVASC) 10 MG tablet [id number] Yes Take 1 tablet (10 mg total) by mouth daily. Janith Lima, MD Taking Active   atorvastatin (LIPITOR) 40 MG tablet [id number] Yes TAKE 1 TABLET BY MOUTH EVERY DAY Janith Lima, MD Taking Active   carvedilol (COREG) 6.25 MG tablet [id number] Yes TAKE 1 TABLET BY MOUTH TWICE A DAY Leonie Man, MD Taking Active   cetirizine (ZYRTEC) 5 MG tablet [id number] Yes Take 5 mg by mouth daily. [provider] Taking Active   cholecalciferol (VITAMIN D3) 25 MCG (1000 UNIT) tablet [id number] Yes Take 1,000 Units by mouth daily. [provider] Taking Active   dapagliflozin propanediol (FARXIGA) 10 MG TABS tablet [id number] Yes Take 1 tablet (10 mg total) by mouth daily before breakfast. Janith Lima, MD Taking Active   dexlansoprazole (DEXILANT) 60 MG capsule [id number] Yes Take 1 capsule (60 mg total) by mouth daily. Janith Lima, MD Taking Active   docusate sodium (COLACE) 100 MG capsule [id number] Yes Take 100 mg by mouth 2 (two) times daily. [provider]  Active   fluticasone (FLONASE) 50 MCG/ACT nasal spray [id number] Yes Place 2 sprays into both nostrils daily. Janith Lima, MD Taking Active Self  indapamide (LOZOL) 1.25 MG tablet [id number] Yes Take 1 tablet (1.25 mg total) by mouth daily. Janith Lima, MD  Taking Active   KLOR-CON M20 20 MEQ tablet [id number] Yes TAKE 1 TABLET BY MOUTH TWICE A DAY Janith Lima, MD Taking Active  levothyroxine (SYNTHROID) 112 MCG tablet [id number] Yes Take 1 tablet (112 mcg total) by mouth daily. Shamleffer, Melanie Crazier, MD Taking Active   losartan (COZAAR) 100 MG tablet [id number] Yes Take 1 tablet (100 mg total) by mouth daily. Janith Lima, MD Taking Active   mometasone-formoterol [facility]) 100-5 MCG/ACT Hollie Salk [id number] No Inhale 2 puffs into the lungs 2 (two) times daily.  Patient not taking: Reported on 07/02/2021   Janith Lima, MD Not Taking Active   Multiple Vitamin (MULTIVITAMIN) tablet [id number] Yes Take 1 tablet by mouth daily. [provider] Taking Active Self  Polyethyl Glycol-Propyl Glycol 0.4-0.3 % SOLN [id number] Yes Apply 1 drop to eye as needed. [provider] Taking Active   tamsulosin (FLOMAX) 0.4 MG CAPS capsule [id number] Yes TAKE 1 CAPSULE BY MOUTH  DAILY AFTER SUPPER Janith Lima, MD Taking Active   vitamin C (ASCORBIC ACID) 500 MG tablet [id number] Yes Take 500 mg by mouth daily. [provider] Taking Active             Patient Active Problem List   Diagnosis Date Noted   Encounter for general adult medical examination with abnormal findings 06/22/2021   Pill dysphagia 06/21/2021   Bradycardia 11/30/2020   Graves' orbitopathy 07/13/2020   Type II diabetes mellitus with manifestations ([place]) 05/13/2020   Diuretic-induced hypokalemia 12/16/2019   Graves disease 10/07/2019   Postablative hypothyroidism 10/07/2019   Thyroid-related proptosis 08/15/2019   Chronic renal disease, stage 3, moderately decreased glomerular filtration rate (GFR) between 30-59 mL/min/1.73 square meter (HCC) 08/15/2019   Long-term current use of opiate analgesic 05/14/2018   Bilateral epiphora 11/16/2017   Thiamine deficiency 06/08/2017   Obesity (BMI 30.0-34.9) 06/04/2017   Hypertensive heart disease without CHF  (congestive heart failure) 12/25/2016   Right cervical radiculopathy 06/16/2016   Iron deficiency anemia 03/08/2016   Spinal stenosis in cervical region 08/25/2015   Allergic eczema 08/07/2014   Obstructive sleep apnea 11/12/2013   Mild intermittent asthma 06/03/2013   Routine general medical examination at a health care facility 04/02/2013   Herniated lumbar disc without myelopathy 10/18/2012   Allergic rhinitis 03/10/2011   Cardiomegaly 10/18/2010   ERECTILE DYSFUNCTION, NON-ORGANIC, MILD 06/29/2009   Hyperlipidemia with target LDL less than 100 05/28/2009   Essential hypertension, benign 05/28/2009   GERD 05/28/2009   BPH associated with nocturia 05/28/2009   Primary osteoarthritis of right knee 05/28/2009    Immunization History  Administered Date(s) Administered   Fluad Quad(high Dose 65+) 07/27/2019   Influenza Split 10/24/2011   Influenza Whole 08/19/2009, 07/23/2010   Influenza, High Dose Seasonal PF 10/21/2013, 09/27/2016, 09/07/2017, 09/14/2018   Influenza,inj,Quad PF,6+ Mos 08/07/2014, 08/25/2015   Influenza-Unspecified 08/28/2020   PFIZER Comirnaty(Gray Top)Covid-19 Tri-Sucrose Vaccine 04/17/2021   PFIZER(Purple Top)SARS-COV-2 Vaccination 01/27/2020, 02/25/2020, 08/28/2020   Pneumococcal Conjugate-13 08/25/2015   Pneumococcal Polysaccharide-23 11/28/2004, 03/08/2016   Td 11/28/2004   Tdap 08/25/2015   Zoster Recombinat (Shingrix) 08/23/2019   Zoster, Live 02/24/2011    Conditions to be addressed/monitored:  Hypertension, Hyperlipidemia, Prediabetes, Heart Failure, GERD, Asthma, Chronic Kidney Disease, Hypothyroidism, BPH, and Allergic Rhinitis  Care Plan : CCM Care Plan  Updates made by Tomasa Blase, RPH since 07/02/2021 12:00 AM     Problem: HTN, HF, HLD, CKD, Hypothyroidism, Allergic Rhinitis, BPH, GERD, Prediabetes   Priority: High  Onset Date: 07/02/2021     Long-Range Goal: Disease Management   Start Date: 07/02/2021  Expected End Date: 01/02/2022   This Visit's Progress: On track  Priority: High  Note:   Current Barriers:  Unable to independently monitor therapeutic efficacy Unable to achieve control of Blood pressure   B[facility] Clinical Goal(s):  Patient will achieve adherence to monitoring guidelines and medication adherence to achieve therapeutic efficacy achieve control of blood pressure as evidenced by blood pressure logs maintain control of LDL, asthma, hypothyroidism, CKD, and A1c as evidenced by next lab results, and asthma exacerbation frequency  through collaboration with PharmD and provider.   Interventions: 1:1 collaboration with Janith Lima, MD regarding development and update of comprehensive plan of care as evidenced by provider attestation and co-signature Inter-disciplinary care team collaboration (see longitudinal plan of care) Comprehensive medication review performed; medication list updated in electronic medical record  Hyperlipidemia: (LDL goal < 100) -Controlled -Last LDL: 11m/dL (11/30/2020) -Current treatment: Atorvastatin 432m- 1 tablet daily  -Medications previously tried: rosuvastatin, pravastatin   -Current dietary patterns: reports that he is reducing amount of foods high in cholesterol that he is eating, increasing lean protein intake -Current exercise habits: walking for about 30 minutes daily  -Educated on Cholesterol goals;  Benefits of statin for ASCVD risk reduction; Importance of limiting foods high in cholesterol; Exercise goal of 150 minutes per week; -Counseled on diet and exercise extensively Recommended to continue current medication  Heart Failure (Goal: manage symptoms and prevent exacerbations) / Hypertension (BP goal <140/90) -Not ideally controlled -Last ejection fraction: 47% (Date: 06/2013 - estimated via myoview - thought to be underestimation) -NYHA Class: II (slight limitation of activity)-III -Current treatment: Indapamide 1.2559m 1 tablet daily  Losartan 100m38m 1 tablet daily  Amlodipine 10mg32m tablet daily  Carvedilol 6.25mg 5mtablet twice daily  Potassium Chloride 20 mEq - 1 tablet twice daily  Last potassium level: 3.5mEq/L56m/25/2022) -Medications previously tried: valsartan, hydrochlorothiazide, azilsartan,   -Current home BP/HR readings: n/a - agreeable to start checking - in office todat was 166/79 with HR of 43 -Current dietary habits: reports that he does not salt his food, tries to follow low sodium diet -Current exercise habits: walking daily for 30 minutes, plans to start using gym more often and becoming more active  -Educated on Benefits of medications for managing symptoms and prolonging life Proper diuretic administration and potassium supplementation Importance of blood pressure control -Counseled on diet and exercise extensively Recommended to split losartan dosing to 50mg tw2mdaily   Asthma (Goal: control symptoms and prevent exacerbations) -Controlled -Current treatment  Mometasone-Formoterol (Dulera) 100-5mcg - 235mffs twice daily - not currently using  -Medications previously tried: albuterol HFA inhaler  -Exacerbations requiring treatment in last 6 months: n/a -Patient denies consistent use of maintenance inhaler -Frequency of rescue inhaler use: n/a -Counseled on Proper inhaler technique; Benefits of consistent maintenance inhaler use When to use rescue inhaler Differences between maintenance and rescue inhalers -Recommended for patient to continue hold of dulera as he has not used in a number of months and has not had an exacerbation, patient would like albuterol inhaler to have if needed will send in for refill of rescue inhaler  Hypothyroidism (Goal: Maintenance of euthyroid levels) -Controlled -Last TSH level: 1.25uIU/mL -Current treatment  Levothyroxine 112mcg - 119mlet daily  -Medications previously tried: n/a  -Recommended to continue current medication  GERD (Goal: Prevention/ control of acid  reflux) -Controlled -Current treatment  Dexlansoprazole 60mg - 1 c39mle daily  -Medications previously tried: Omeprazole, lansoprazole  -Counseled on diet and exercise extensively Recommended for patient to restart omeprazole 40mg daily 51mto cost of dexliant  BPH (Goal: Prevention / treatment of urinary symptoms) -Controlled -Current treatment  Tamsulosin 0.61m - 1 capsule daily  -Medications previously tried: oxybutynin, dutasteride, solifenacin  -Recommended to continue current medication  Prediabetes (A1c goal <6.5%) -Controlled -Last A1c: 6.5% (06/21/2021) - Farixa 19mdaily started for renal protection - would expect for A1c to decrease with next check -Current meal patterns:  breakfast: eggs with toast, yogurt, coffee   lunch: tuna salad, chicken wings, vegetables  dinner: chicken, vegetable stew / soup snacks: does not typically eat drinks: apple / cranberry juice, orange juice -Current exercise: walking for 30 minutes daily  -Educated on A1c and blood sugar goals; Complications of diabetes including kidney damage, retinal damage, and cardiovascular disease; Exercise goal of 150 minutes per week; Benefits of weight loss; -Counseled to check feet daily and get yearly eye exams -Counseled on diet and exercise extensively Recommended to continue current medication  Chronic Kidney Disease (Goal: Prevention of disease progression) -Controlled -Last eGFR: 45.98 mL/min (06/21/2021) -Last CrCl: 50.8 mL/min  -Current treatment  Farxiga 107m 1 tablet daily  -Medications previously tried: n/a  -Recommended avoidance of nephrotoxic agents, discussed importance of adequate BP and BG control to avoid kidney damage   Allergic Rhinitis (Goal: treament / control of allergies) -Controlled -Current treatment  Cetirizine 5mg13m1 tablet daily  Fluticasone 50mc46mt - 2 sprays into each nostril once daily if needed  -Medications previously tried: n/a  -Recommended to continue  current medication   Health Maintenance -Vaccine gaps: Shingles and Flu vaccine  -Current therapy:  Multivitamin - 1 tablet daily  Vitamin C 500mg 10mtablet daily  Vitamin D3 1000 units - 1 tablet daily  Docusate 100mg -43mapsule twice daily  Systane Eye drops - 1 drop into each eye as needed for dry eyes  -Educated on Cost vs benefit of each product must be carefully weighed by individual consumer -Patient is satisfied with current therapy and denies issues -Recommended to continue current medication  Patient Goals/Self-Care Activities Patient will:  - take medications as prescribed check blood pressure 3 times weekly, document, and provide at future appointments target a minimum of 150 minutes of moderate intensity exercise weekly engage in dietary modifications by reducing sodium intake / moderation of carb intake / reduction of intake in foods high in cholesterol  Follow Up Plan: Telephone follow up appointment with care management team member scheduled for: The patient has been provided with contact information for the care management team and has been advised to call with any health related questions or concerns.         Medication Assistance: None required.  Patient affirms current coverage meets needs.  Patient's preferred pharmacy is:  [employer] [id number] [phone] #[id number] - [id number] [phone] #[id number] - [id number], [place] - [address] [phone] #[id number] - [id number], [place] - [phone] box? Yes Pt endorses 100% compliance  Care Plan  and Follow Up Patient Decision:  Patient agrees to Care Plan and Follow-up.  Plan: Telephone follow up appointment with care management team member scheduled for:  2 months and The patient has been provided with contact information  for the care management team and has been advised to call with any health related questions or concerns.   Tomasa Blase, PharmD Clinical [facility], [place]

## 2021-07-02 ENCOUNTER — Ambulatory Visit (INDEPENDENT_AMBULATORY_CARE_PROVIDER_SITE_OTHER): Payer: Medicare HMO

## 2021-07-02 ENCOUNTER — Other Ambulatory Visit: Payer: Self-pay

## 2021-07-02 DIAGNOSIS — I119 Hypertensive heart disease without heart failure: Secondary | ICD-10-CM | POA: Diagnosis not present

## 2021-07-02 DIAGNOSIS — N401 Enlarged prostate with lower urinary tract symptoms: Secondary | ICD-10-CM

## 2021-07-02 DIAGNOSIS — I10 Essential (primary) hypertension: Secondary | ICD-10-CM

## 2021-07-02 DIAGNOSIS — K2100 Gastro-esophageal reflux disease with esophagitis, without bleeding: Secondary | ICD-10-CM

## 2021-07-02 DIAGNOSIS — E785 Hyperlipidemia, unspecified: Secondary | ICD-10-CM

## 2021-07-02 DIAGNOSIS — J4521 Mild intermittent asthma with (acute) exacerbation: Secondary | ICD-10-CM | POA: Diagnosis not present

## 2021-07-02 DIAGNOSIS — R351 Nocturia: Secondary | ICD-10-CM | POA: Diagnosis not present

## 2021-07-02 DIAGNOSIS — J301 Allergic rhinitis due to pollen: Secondary | ICD-10-CM

## 2021-07-02 NOTE — Patient Instructions (Signed)
Visit Information   PATIENT GOALS:   Goals Addressed             This Visit's Progress    Track and Manage My Blood Pressure-Hypertension       Timeframe:  Long-Range Goal Priority:  High Start Date:  07/02/2021                           Expected End Date:  01/02/2022                     Follow Up Date 09/01/2021   - check blood pressure 3 times per week - choose a place to take my blood pressure (home, clinic or office, retail store) - write blood pressure results in a log or diary    Why is this important?   You won't feel high blood pressure, but it can still hurt your blood vessels.  High blood pressure can cause heart or kidney problems. It can also cause a stroke.  Making lifestyle changes like losing a little weight or eating less salt will help.  Checking your blood pressure at home and at different times of the day can help to control blood pressure.  If the doctor prescribes medicine remember to take it the way the doctor ordered.  Call the office if you cannot afford the medicine or if there are questions about it.           Consent to CCM Services: Mr. Lukas was given information about Chronic Care Management services including:  CCM service includes personalized support from designated clinical staff supervised by his physician, including individualized plan of care and coordination with other care providers 24/7 contact phone numbers for assistance for urgent and routine care needs. Service will only be billed when office clinical staff spend 20 minutes or more in a month to coordinate care. Only one practitioner may furnish and bill the service in a calendar month. The patient may stop CCM services at any time (effective at the end of the month) by phone call to the office staff. The patient will be responsible for cost sharing (co-pay) of up to 20% of the service fee (after annual deductible is met).  Patient agreed to services and verbal consent obtained.    Patient verbalizes understanding of instructions provided today and agrees to view in [place].   Telephone follow up appointment with care management team member scheduled for: 2 months The patient has been provided with contact information for the care management team and has been advised to call with any health related questions or concerns.   Tomasa Blase, PharmD Clinical Pharmacist, [place]    CLINICAL CARE PLAN: Patient Care Plan: CCM Care Plan     Problem Identified: HTN, HF, HLD, CKD, Hypothyroidism, Allergic Rhinitis, BPH, GERD, Prediabetes   Priority: High  Onset Date: 07/02/2021     Long-Range Goal: Disease Management   Start Date: 07/02/2021  Expected End Date: 01/02/2022  This Visit's Progress: On track  Priority: High  Note:   Current Barriers:  Unable to independently monitor therapeutic efficacy Unable to achieve control of Blood pressure   Pharmacist Clinical Goal(s):  Patient will achieve adherence to monitoring guidelines and medication adherence to achieve therapeutic efficacy achieve control of blood pressure as evidenced by blood pressure logs maintain control of LDL, asthma, hypothyroidism, CKD, and A1c as evidenced by next lab results, and asthma exacerbation frequency  through collaboration  with PharmD and provider.   Interventions: 1:1 collaboration with Janith Lima, MD regarding development and update of comprehensive plan of care as evidenced by provider attestation and co-signature Inter-disciplinary care team collaboration (see longitudinal plan of care) Comprehensive medication review performed; medication list updated in electronic medical record  Hyperlipidemia: (LDL goal < 100) -Controlled -Last LDL: 19m/dL (11/30/2020) -Current treatment: Atorvastatin 439m- 1 tablet daily  -Medications previously tried: rosuvastatin, pravastatin   -Current dietary patterns: reports that he is reducing amount of foods high in cholesterol  that he is eating, increasing lean protein intake -Current exercise habits: walking for about 30 minutes daily  -Educated on Cholesterol goals;  Benefits of statin for ASCVD risk reduction; Importance of limiting foods high in cholesterol; Exercise goal of 150 minutes per week; -Counseled on diet and exercise extensively Recommended to continue current medication  Heart Failure (Goal: manage symptoms and prevent exacerbations) / Hypertension (BP goal <140/90) -Not ideally controlled -Last ejection fraction: 47% (Date: 06/2013 - estimated via myoview - thought to be underestimation) -NYHA Class: II (slight limitation of activity)-III -Current treatment: Indapamide 1.2520m 1 tablet daily  Losartan 100m41m1 tablet daily  Amlodipine 10mg46m tablet daily  Carvedilol 6.25mg 12mtablet twice daily  Potassium Chloride 20 mEq - 1 tablet twice daily  Last potassium level: 3.5mEq/L73m/25/2022) -Medications previously tried: valsartan, hydrochlorothiazide, azilsartan,   -Current home BP/HR readings: n/a - agreeable to start checking - in office todat was 166/79 with HR of 43 -Current dietary habits: reports that he does not salt his food, tries to follow low sodium diet -Current exercise habits: walking daily for 30 minutes, plans to start using gym more often and becoming more active  -Educated on Benefits of medications for managing symptoms and prolonging life Proper diuretic administration and potassium supplementation Importance of blood pressure control -Counseled on diet and exercise extensively Recommended to split losartan dosing to 50mg tw43mdaily   Asthma (Goal: control symptoms and prevent exacerbations) -Controlled -Current treatment  Mometasone-Formoterol (Dulera) 100-5mcg - 239mffs twice daily - not currently using  -Medications previously tried: albuterol HFA inhaler  -Exacerbations requiring treatment in last 6 months: n/a -Patient denies consistent use of maintenance  inhaler -Frequency of rescue inhaler use: n/a -Counseled on Proper inhaler technique; Benefits of consistent maintenance inhaler use When to use rescue inhaler Differences between maintenance and rescue inhalers -Recommended for patient to continue hold of dulera as he has not used in a number of months and has not had an exacerbation, patient would like albuterol inhaler to have if needed will send in for refill of rescue inhaler  Hypothyroidism (Goal: Maintenance of euthyroid levels) -Controlled -Last TSH level: 1.25uIU/mL -Current treatment  Levothyroxine 112mcg - 149mlet daily  -Medications previously tried: n/a  -Recommended to continue current medication  GERD (Goal: Prevention/ control of acid reflux) -Controlled -Current treatment  Dexlansoprazole 60mg - 1 c47mle daily  -Medications previously tried: Omeprazole, lansoprazole  -Counseled on diet and exercise extensively Recommended for patient to restart omeprazole 40mg daily 30mto cost of dexliant   BPH (Goal: Prevention / treatment of urinary symptoms) -Controlled -Current treatment  Tamsulosin 0.4mg - 1 caps91m daily  -Medications previously tried: oxybutynin, dutasteride, solifenacin  -Recommended to continue current medication  Prediabetes (A1c goal <6.5%) -Controlled -Last A1c: 6.5% (06/21/2021) - Farixa 10mg daily st86md for renal protection - would expect for A1c to decrease with next check -Current meal patterns:  breakfast: eggs with toast, yogurt, coffee  lunch: tuna salad, chicken wings, vegetables  dinner: chicken, vegetable stew / soup snacks: does not typically eat drinks: apple / cranberry juice, orange juice -Current exercise: walking for 30 minutes daily  -Educated on A1c and blood sugar goals; Complications of diabetes including kidney damage, retinal damage, and cardiovascular disease; Exercise goal of 150 minutes per week; Benefits of weight loss; -Counseled to check feet daily and get  yearly eye exams -Counseled on diet and exercise extensively Recommended to continue current medication  Chronic Kidney Disease (Goal: Prevention of disease progression) -Controlled -Last eGFR: 45.98 mL/min (06/21/2021) -Last CrCl: 50.8 mL/min  -Current treatment  Farxiga 76m - 1 tablet daily  -Medications previously tried: n/a  -Recommended avoidance of nephrotoxic agents, discussed importance of adequate BP and BG control to avoid kidney damage   Allergic Rhinitis (Goal: treament / control of allergies) -Controlled -Current treatment  Cetirizine 52m- 1 tablet daily  Fluticasone 5025mact - 2 sprays into each nostril once daily if needed  -Medications previously tried: n/a  -Recommended to continue current medication   Health Maintenance -Vaccine gaps: Shingles and Flu vaccine  -Current therapy:  Multivitamin - 1 tablet daily  Vitamin C 500m2m1 tablet daily  Vitamin D3 1000 units - 1 tablet daily  Docusate 100mg97m capsule twice daily  Systane Eye drops - 1 drop into each eye as needed for dry eyes  -Educated on Cost vs benefit of each product must be carefully weighed by individual consumer -Patient is satisfied with current therapy and denies issues -Recommended to continue current medication  Patient Goals/Self-Care Activities Patient will:  - take medications as prescribed check blood pressure 3 times weekly, document, and provide at future appointments target a minimum of 150 minutes of moderate intensity exercise weekly engage in dietary modifications by reducing sodium intake / moderation of carb intake / reduction of intake in foods high in cholesterol  Follow Up Plan: Telephone follow up appointment with care management team member scheduled for: The patient has been provided with contact information for the care management team and has been advised to call with any health related questions or concerns.

## 2021-07-05 ENCOUNTER — Other Ambulatory Visit: Payer: Self-pay | Admitting: Internal Medicine

## 2021-07-05 ENCOUNTER — Other Ambulatory Visit: Payer: Self-pay | Admitting: Cardiology

## 2021-07-05 DIAGNOSIS — J4520 Mild intermittent asthma, uncomplicated: Secondary | ICD-10-CM

## 2021-07-05 DIAGNOSIS — I119 Hypertensive heart disease without heart failure: Secondary | ICD-10-CM

## 2021-07-05 DIAGNOSIS — K2100 Gastro-esophageal reflux disease with esophagitis, without bleeding: Secondary | ICD-10-CM

## 2021-07-05 MED ORDER — ALBUTEROL SULFATE HFA 108 (90 BASE) MCG/ACT IN AERS
2.0000 | INHALATION_SPRAY | Freq: Four times a day (QID) | RESPIRATORY_TRACT | 3 refills | Status: DC | PRN
Start: 2021-07-05 — End: 2021-07-05

## 2021-07-05 MED ORDER — OMEPRAZOLE 40 MG PO CPDR: 40.0000 mg | DELAYED_RELEASE_CAPSULE | Freq: Every day | ORAL | 1 refills | Status: DC

## 2021-07-05 MED ORDER — ALBUTEROL SULFATE HFA 108 (90 BASE) MCG/ACT IN AERS: 2.0000 | INHALATION_SPRAY | Freq: Four times a day (QID) | RESPIRATORY_TRACT | 3 refills | Status: DC | PRN

## 2021-07-05 MED ORDER — OMEPRAZOLE 40 MG PO CPDR
40.0000 mg | DELAYED_RELEASE_CAPSULE | Freq: Every day | ORAL | 1 refills | Status: DC
Start: 2021-07-05 — End: 2021-07-05

## 2021-07-08 ENCOUNTER — Encounter: Payer: Self-pay | Admitting: Internal Medicine

## 2021-07-08 ENCOUNTER — Other Ambulatory Visit: Payer: Self-pay | Admitting: Internal Medicine

## 2021-08-06 ENCOUNTER — Ambulatory Visit: Payer: Medicare HMO | Admitting: Adult Health

## 2021-08-16 ENCOUNTER — Other Ambulatory Visit: Payer: Self-pay | Admitting: Internal Medicine

## 2021-08-16 DIAGNOSIS — E876 Hypokalemia: Secondary | ICD-10-CM

## 2021-08-16 DIAGNOSIS — I10 Essential (primary) hypertension: Secondary | ICD-10-CM

## 2021-08-30 ENCOUNTER — Other Ambulatory Visit: Payer: Self-pay | Admitting: Internal Medicine

## 2021-08-30 DIAGNOSIS — E118 Type 2 diabetes mellitus with unspecified complications: Secondary | ICD-10-CM

## 2021-08-30 DIAGNOSIS — I119 Hypertensive heart disease without heart failure: Secondary | ICD-10-CM

## 2021-08-30 DIAGNOSIS — I10 Essential (primary) hypertension: Secondary | ICD-10-CM

## 2021-09-06 ENCOUNTER — Ambulatory Visit (INDEPENDENT_AMBULATORY_CARE_PROVIDER_SITE_OTHER): Payer: Medicare HMO | Admitting: Internal Medicine

## 2021-09-06 ENCOUNTER — Encounter: Payer: Self-pay | Admitting: Internal Medicine

## 2021-09-06 ENCOUNTER — Other Ambulatory Visit: Payer: Self-pay

## 2021-09-06 ENCOUNTER — Ambulatory Visit: Payer: Medicare HMO | Attending: Internal Medicine

## 2021-09-06 VITALS — BP 150/70 | HR 47 | Temp 98.2°F | Ht 70.0 in | Wt 218.0 lb

## 2021-09-06 DIAGNOSIS — Z23 Encounter for immunization: Secondary | ICD-10-CM

## 2021-09-06 DIAGNOSIS — J4520 Mild intermittent asthma, uncomplicated: Secondary | ICD-10-CM

## 2021-09-06 DIAGNOSIS — R001 Bradycardia, unspecified: Secondary | ICD-10-CM | POA: Diagnosis not present

## 2021-09-06 DIAGNOSIS — N1831 Chronic kidney disease, stage 3a: Secondary | ICD-10-CM | POA: Diagnosis not present

## 2021-09-06 DIAGNOSIS — I10 Essential (primary) hypertension: Secondary | ICD-10-CM | POA: Diagnosis not present

## 2021-09-06 LAB — BASIC METABOLIC PANEL
BUN: 21 mg/dL (ref 6–23)
CO2: 27 mEq/L (ref 19–32)
Calcium: 9.5 mg/dL (ref 8.4–10.5)
Chloride: 103 mEq/L (ref 96–112)
Creatinine, Ser: 1.54 mg/dL — ABNORMAL HIGH (ref 0.40–1.50)
GFR: 43.77 mL/min — ABNORMAL LOW (ref >60.00)
Glucose, Bld: 110 mg/dL — ABNORMAL HIGH (ref 70–99)
Potassium: 4 mEq/L (ref 3.5–5.1)
Sodium: 138 mEq/L (ref 135–145)

## 2021-09-06 MED ORDER — PREDNISONE 10 MG PO TABS: ORAL_TABLET | ORAL | 0 refills | Status: DC

## 2021-09-06 NOTE — Patient Instructions (Addendum)
Ok to STOP the carvedilol (which is coreg, the beta blocker), and you may not be able to take this in the future as it appears even a small dose you are on is likely causing the low heart rate (low pulse)  Please take all new medication as prescribed - the prednisone (short course), and continue the albuterol as needed  Please consider the [facility] for possible change of dulera to advair generic which is about $175 for 3 months medication  Please continue to monitor your BP at home if possible, with a goal to be at least less than 140/90 most of the time  Please go to the LAB at the blood drawing area for the tests to be done  You will be contacted by phone if any changes need to be made immediately.  Otherwise, you will receive a letter about your results with an explanation, but please check with MyChart first.  Please remember to sign up for MyChart if you have not done so, as this will be important to you in the future with finding out test results, communicating by private email, and scheduling acute appointments online when needed.  Please make an Appointment to return in 1 month (or next available) to see Dr Ronnald Ramp, as you may need different BP medicine if the BP is still high that does not also make your pulse too low

## 2021-09-06 NOTE — Progress Notes (Signed)
   Covid-19 Vaccination Clinic  Name:  Ronald Bond    MRN: [id number] DOB: [birth year]  09/06/2021  [name] was observed post Covid-19 immunization for 15 minutes without incident. He was provided with Vaccine Information Sheet and instruction to access the V-Safe system.   [name] was instructed to call 911 with any severe reactions post vaccine: Difficulty breathing  Swelling of face and throat  A fast heartbeat  A bad rash all over body  Dizziness and weakness

## 2021-09-06 NOTE — Progress Notes (Signed)
Patient ID: Ronald Bond, male   DOB: [birth year], 75 y.o.   MRN: [id number]        Chief Complaint: follow up HTN, low HR, CKD, wheezing       HPI:  Ronald Bond is a 75 y.o. male here with c/o 2 wks onset recurrent episodes mild to mod dizzy episodes with sob, seems worse on getting up in the AM OOB and out of a car, last several seconds to get his bearings a few wobbly steps, then seems to improve, then not really a problem after that.    Pt denies chest pain, orthopnea, PND, increased LE swelling, palpitations, dizziness or syncope but has had mild onset 3 days wheezing sob doe c/w his seasonal asthma flare he sometimes gets, despite taking his inhaler.         Wt Readings from Last 3 Encounters:  09/06/21 218 lb (98.9 kg)  06/21/21 217 lb (98.4 kg)  02/08/21 214 lb (97.1 kg)   BP Readings from Last 3 Encounters:  09/06/21 (!) 150/70  06/21/21 (!) 170/80  02/08/21 140/84         Past Medical History:  Diagnosis Date   GERD (gastroesophageal reflux disease)    HTN (hypertension)    Hyperlipidemia    Hypothyroidism    Osteoarthritis    Wears glasses    Past Surgical History:  Procedure Laterality Date   COLONOSCOPY     EYE SURGERY     both cataracts   MICROLARYNGOSCOPY Left 11/18/2013   Procedure: MICROLARYNGOSCOPY WITH REMOVAL OF GRANULOMA LEFT SIDE;  Surgeon: Izora Gala, MD;  Location: [facility];  Service: ENT;  Laterality: Left;   NM MYOVIEW LTD  06/2007   Low risk, normal.  No evidence of ischemia or infarction   THYROIDECTOMY  1974   TONSILLECTOMY     TOTAL KNEE ARTHROPLASTY  2009   left    reports that he has never smoked. He has never used smokeless tobacco. He reports current alcohol use. He reports that he does not use drugs. family history includes Brain cancer in his mother; Heart disease in his brother and father. Allergies  Allergen Reactions   Lisinopril Other (See Comments)    Edema in face   Current Outpatient Medications on File  Prior to Visit  Medication Sig Dispense Refill   albuterol (VENTOLIN HFA) 108 (90 Base) MCG/ACT inhaler Inhale 2 puffs into the lungs every 6 (six) hours as needed for wheezing or shortness of breath. 18 g 3   amLODipine (NORVASC) 10 MG tablet TAKE 1 TABLET BY MOUTH EVERY DAY 90 tablet 0   atorvastatin (LIPITOR) 40 MG tablet TAKE 1 TABLET BY MOUTH EVERY DAY 90 tablet 1   cetirizine (ZYRTEC) 5 MG tablet Take 5 mg by mouth daily.     cholecalciferol (VITAMIN D3) 25 MCG (1000 UNIT) tablet Take 1,000 Units by mouth daily.     dapagliflozin propanediol (FARXIGA) 10 MG TABS tablet Take 1 tablet (10 mg total) by mouth daily before breakfast. 90 tablet 1   docusate sodium (COLACE) 100 MG capsule Take 100 mg by mouth 2 (two) times daily.     fluticasone (FLONASE) 50 MCG/ACT nasal spray Place 2 sprays into both nostrils daily. 48 g 1   indapamide (LOZOL) 1.25 MG tablet TAKE 1 TABLET BY MOUTH DAILY. 90 tablet 0   KLOR-CON M20 20 MEQ tablet TAKE 1 TABLET BY MOUTH TWICE A DAY 180 tablet 0   levothyroxine (SYNTHROID) 112 MCG tablet  Take 1 tablet (112 mcg total) by mouth daily. 90 tablet 3   losartan (COZAAR) 100 MG tablet TAKE 1 TABLET BY MOUTH EVERY DAY 90 tablet 0   mometasone-formoterol (DULERA) 100-5 MCG/ACT AERO Inhale 2 puffs into the lungs 2 (two) times daily. 13 g 11   Multiple Vitamin (MULTIVITAMIN) tablet Take 1 tablet by mouth daily.     omeprazole (PRILOSEC) 40 MG capsule Take 1 capsule (40 mg total) by mouth daily. 90 capsule 1   Polyethyl Glycol-Propyl Glycol 0.4-0.3 % SOLN Apply 1 drop to eye as needed.     tamsulosin (FLOMAX) 0.4 MG CAPS capsule TAKE 1 CAPSULE BY MOUTH  DAILY AFTER SUPPER 90 capsule 1   vitamin C (ASCORBIC ACID) 500 MG tablet Take 500 mg by mouth daily.     No current facility-administered medications on file prior to visit.        ROS:  All others reviewed and negative.  Objective        PE:  BP (!) 150/70 (BP Location: Right Arm, Patient Position: Sitting, Cuff Size:  Large)   Pulse (!) 47   Temp 98.2 F (36.8 C) (Oral)   Ht 5\' 10"  (1.778 m)   Wt 218 lb (98.9 kg)   SpO2 99%   BMI 31.28 kg/m                 Constitutional: Pt appears in NAD               HENT: Head: NCAT.                Right Ear: External ear normal.                 Left Ear: External ear normal.                Eyes: . Pupils are equal, round, and reactive to light. Conjunctivae and EOM are normal               Nose: without d/c or deformity               Neck: Neck supple. Gross normal ROM               Cardiovascular: Normal rate and regular rhythm.                 Pulmonary/Chest: Effort normal and breath sounds without rales but mild few bilat wheezing.                Abd:  Soft, NT, ND, + BS, no organomegaly               Neurological: Pt is alert. At baseline orientation, motor grossly intact               Skin: Skin is warm. No rashes, no other new lesions, LE edema - none               Psychiatric: Pt behavior is normal without agitation   Micro: none  Cardiac tracings I have personally interpreted today:  none  Pertinent Radiological findings (summarize): none   Lab Results  Component Value Date   WBC 7.1 06/21/2021   HGB 12.3 (L) 06/21/2021   HCT 37.0 (L) 06/21/2021   PLT 244.0 06/21/2021   GLUCOSE 110 (H) 09/06/2021   CHOL 171 11/30/2020   TRIG 95.0 11/30/2020   HDL 63.70 11/30/2020   LDLDIRECT 75.0 03/08/2016   LDLCALC 89 11/30/2020   ALT  17 11/30/2020   AST 15 11/30/2020   NA 138 09/06/2021   K 4.0 09/06/2021   CL 103 09/06/2021   CREATININE 1.54 (H) 09/06/2021   BUN 21 09/06/2021   CO2 27 09/06/2021   TSH 1.25 06/21/2021   PSA 3.26 11/30/2020   INR 0.9 10/17/2007   HGBA1C 6.5 06/21/2021   MICROALBUR 21.0 (H) 11/30/2020   Assessment/Plan:  Ronald Bond is a 75 y.o. Black or African American [2] male with  has a past medical history of GERD (gastroesophageal reflux disease), HTN (hypertension), Hyperlipidemia, Hypothyroidism, Osteoarthritis,  and Wears glasses.  Mild intermittent asthma With mild flare, for prednisone course asd, continue inhaler prn,  to f/u any worsening symptoms or concerns, consider change of dulera (which he is not taking due to cost) to advair 250/50 such as at [facility] for $175 for 3 mo supply  Chronic renal disease, stage 3, moderately decreased glomerular filtration rate (GFR) between 30-59 mL/min/1.73 square meter (HCC) Lab Results  Component Value Date   CREATININE 1.54 (H) 09/06/2021   Stable overall, cont to avoid nephrotoxins   Bradycardia Uncontrolled, possibly related to his symptoms dizziness and unsteady  - for d/c coreg and f/u PCP  Essential hypertension, benign Uncontrolled mld, will try to avoid further med change for now and follow up with pcp per pt preference  Followup: No follow-ups on file.  Cathlean Cower, MD 09/09/2021 9:29 PM [facility]

## 2021-09-09 NOTE — Assessment & Plan Note (Signed)
Lab Results  Component Value Date   CREATININE 1.54 (H) 09/06/2021   Stable overall, cont to avoid nephrotoxins

## 2021-09-09 NOTE — Assessment & Plan Note (Addendum)
With mild flare, for prednisone course asd, continue inhaler prn,  to f/u any worsening symptoms or concerns, consider change of dulera (which he is not taking due to cost) to advair 250/50 such as at [facility] for $175 for 3 mo supply

## 2021-09-09 NOTE — Assessment & Plan Note (Signed)
Uncontrolled, possibly related to his symptoms dizziness and unsteady  - for d/c coreg and f/u PCP

## 2021-09-09 NOTE — Assessment & Plan Note (Signed)
Uncontrolled mld, will try to avoid further med change for now and follow up with pcp per pt preference

## 2021-09-10 ENCOUNTER — Telehealth: Payer: Self-pay | Admitting: Cardiology

## 2021-09-10 NOTE — Telephone Encounter (Signed)
Patient of Dr. Ellyn Hack walked in to [facility] office. He thought he had an appointment today (but is scheduled for 10/21 with Arnold Long DNP). He informed front desk staff that he had chest pressure. Triage was notified but did not evaluate personally, and advised front desk consult DOD. Per walk-in policy, acute symptoms are evaluated in ED or if refused by patient, first available appointment scheduled. Made appointment with Dr. Ellyn Hack on 09/13/21 (DOD) but since this was schedule, patient called in and cancelled - still scheduled for visit on 09/17/21

## 2021-09-13 ENCOUNTER — Ambulatory Visit: Payer: Medicare HMO | Admitting: Cardiology

## 2021-09-15 ENCOUNTER — Other Ambulatory Visit (HOSPITAL_BASED_OUTPATIENT_CLINIC_OR_DEPARTMENT_OTHER): Payer: Self-pay

## 2021-09-15 MED ORDER — COVID-19MRNA BIVAL VACC PFIZER 30 MCG/0.3ML IM SUSP
INTRAMUSCULAR | 0 refills | Status: DC
  Filled 2021-09-15: qty 0.3, 1d supply, fill #0

## 2021-09-16 NOTE — Progress Notes (Signed)
Cardiology Office Note   Date:  09/17/2021   ID:  Ronald Bond, DOB 17-Mar-1946, MRN [id number]  PCP:  Ronald Lima, MD  Cardiologist:  Dr.Harding  CC: Follow Up, Chest Pain    History of Present Illness: Ronald Bond is a 75 y.o. male who presents for ongoing assessment and management of hypertensive heart disease without CHF, hyperlipidemia, essential hypertension, history of OSA not using CPAP regularly.  He came to our office on 10/14/202 complaining of chest pain, thought he had an appointment with Dr. Ellyn Bond.  He was advised to go to ED as he was not able to be seen that day.  He is here now for follow-up.    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  He states that he enjoys a lot does a lot of walking and climbing stairs without significant discomfort.  On last visit with Dr. Ellyn Bond blood pressure was not adequately controlled and therefore his carvedilol was increased to 6.25 mg twice daily, he was continued on amlodipine and losartan along with indapamide and potassium supplement.  It was noted that his LDL was still elevated to 89 but stable therefore atorvastatin was not changed.   The patient states that due to the bradycardia, carvedilol was discontinued by Dr. Cathlean Cower.  He states he feels better he was having a little bit of dizziness and this has subsided with discontinuation of beta-blocker.  Past Medical History:  Diagnosis Date   GERD (gastroesophageal reflux disease)    HTN (hypertension)    Hyperlipidemia    Hypothyroidism    Osteoarthritis    Wears glasses     Past Surgical History:  Procedure Laterality Date   COLONOSCOPY     EYE SURGERY     both cataracts   MICROLARYNGOSCOPY Left 11/18/2013   Procedure: MICROLARYNGOSCOPY WITH REMOVAL OF GRANULOMA LEFT SIDE;  Surgeon: Ronald Gala, MD;  Location: [facility];  Service: ENT;  Laterality: Left;   NM MYOVIEW LTD  06/2007   Low risk, normal.  No evidence of ischemia or infarction   THYROIDECTOMY  1974   TONSILLECTOMY     TOTAL KNEE ARTHROPLASTY  2009   left     Current Outpatient Medications  Medication Sig Dispense Refill   albuterol (VENTOLIN HFA) 108 (90 Base) MCG/ACT inhaler Inhale 2 puffs into the lungs every 6 (six) hours as needed for wheezing or shortness of breath. 18 g 3   amLODipine (NORVASC) 10 MG tablet TAKE 1 TABLET BY MOUTH EVERY DAY 90 tablet 0   atorvastatin (LIPITOR) 40 MG tablet TAKE 1 TABLET BY MOUTH EVERY DAY 90 tablet 1   cetirizine (ZYRTEC) 5 MG tablet Take 5 mg by mouth daily.     cholecalciferol (VITAMIN D3) 25 MCG (1000 UNIT) tablet Take 1,000 Units by mouth daily.     COVID-19 mRNA bivalent vaccine, Pfizer, injection Inject into the muscle. 0.3 mL 0   dapagliflozin propanediol (FARXIGA) 10 MG TABS tablet Take 1 tablet (10 mg total) by mouth daily before breakfast. 90 tablet 1   docusate sodium (COLACE) 100 MG capsule Take 100 mg by mouth 2 (two) times daily.     fluticasone (FLONASE) 50 MCG/ACT nasal spray Place 2 sprays into both nostrils daily. 48 g 1   indapamide (LOZOL) 1.25 MG tablet TAKE 1 TABLET BY MOUTH DAILY. [place]  tablet 0   KLOR-CON M20 20 MEQ tablet TAKE 1 TABLET BY MOUTH TWICE A DAY 180 tablet 0   levothyroxine (SYNTHROID) 112 MCG tablet Take 1 tablet (112 mcg total) by mouth daily. 90 tablet 3   losartan (COZAAR) 100 MG tablet TAKE 1 TABLET BY MOUTH EVERY DAY 90 tablet 0   mometasone-formoterol (DULERA) 100-5 MCG/ACT AERO Inhale 2 puffs into the lungs 2 (two) times daily. 13 g 11   Multiple Vitamin (MULTIVITAMIN) tablet Take 1 tablet by mouth daily.     omeprazole (PRILOSEC) 40 MG capsule Take 1 capsule (40 mg total) by mouth daily. 90 capsule 1   Polyethyl Glycol-Propyl Glycol 0.4-0.3 % SOLN Apply 1 drop to eye as needed.     tamsulosin (FLOMAX) 0.4 MG CAPS capsule TAKE 1 CAPSULE BY MOUTH   DAILY AFTER SUPPER 90 capsule 1   vitamin C (ASCORBIC ACID) 500 MG tablet Take 500 mg by mouth daily.     No current facility-administered medications for this visit.    Allergies:   Lisinopril    Social History:  The patient  reports that he has never smoked. He has never used smokeless tobacco. He reports current alcohol use. He reports that he does not use drugs.   Family History:  The patient's family history includes Brain cancer in his mother; Heart disease in his brother and father.    ROS: All other systems are reviewed and negative. Unless otherwise mentioned in H&P    PHYSICAL EXAM: VS:  BP (!) 146/82   Pulse (!) 58   Ht 5\' 10"  (1.778 m)   Wt 214 lb 12.8 oz (97.4 kg)   SpO2 99%   BMI 30.82 kg/m  , BMI Body mass index is 30.82 kg/m. GEN: Well nourished, well developed, in no acute distress HEENT: normal Neck: no JVD, carotid bruits, or masses Cardiac: RRR;, bradycardic soft systolic murmurs, rubs, or gallops,no edema  Respiratory:  Clear to auscultation bilaterally, normal work of breathing GI: soft, nontender, nondistended, + BS MS: no deformity or atrophy Skin: warm and dry, no rash Neuro:  Strength and sensation are intact Psych: euthymic mood, full affect   EKG:  EKG is ordered today. The ekg ordered today demonstrates sinus bradycardia with left axis deviation, heart rate of 58 bpm.   Recent Labs: 11/30/2020: ALT 17; Magnesium 2.2 06/21/2021: Hemoglobin 12.3; Platelets 244.0; TSH 1.25 09/06/2021: BUN 21; Creatinine, Ser 1.54; Potassium 4.0; Sodium 138    Lipid Panel    Component Value Date/Time   CHOL 171 11/30/2020 1023   CHOL 284 (H) 04/17/2015 1230   TRIG 95.0 11/30/2020 1023   TRIG 166 (H) 04/17/2015 1230   HDL 63.70 11/30/2020 1023   HDL 58 04/17/2015 1230   CHOLHDL 3 11/30/2020 1023   VLDL 19.0 11/30/2020 1023   LDLCALC 89 11/30/2020 1023   LDLCALC 193 (H) 04/17/2015 1230   LDLDIRECT 75.0 03/08/2016 1624      Wt Readings from Last 3  Encounters:  09/17/21 214 lb 12.8 oz (97.4 kg)  09/06/21 218 lb (98.9 kg)  06/21/21 217 lb (98.4 kg)      Other studies Reviewed: Cardiac Monitor 10/24/2016 Mostly sinus rhythm to sinus bradycardia. Average heart rate 59 BPM. Lowest heart rate was roughly 50 BPM. 39% sinus bradycardia, 5% sinus tachycardia Both sinus rhythm and sinus bradycardia with PACs and PVCs noted Sinus bradycardia with atrial bigeminy recorded on 2 occasions. No arrhythmia noted - no A. fib or atrial flutter. Overall relatively normal monitor  Monitor was worn from November 27-12/08/2016   1 manual triggered recording noted:    Auto triggers noted for most abnormalities = sinus bradycardia or sinus rhythm with PACs, PVCs or atrial bigeminy.  ASSESSMENT AND PLAN:  1.  Chest discomfort with palpitations: EKG reveals sinus bradycardia despite stopping carvedilol.  I will place a 7-day ZIO monitor on the patient to evaluate for frequency and morphology of his palpitations and evaluation of heart rate.  Uncertain if chest discomfort is related to bradycardia.  He states that he is able to climb stairs and walk on the platform without being short of breath or dizzy, his energy level has been steady.  2.  Hypertension: Blood pressure is stable compared to last office visit.  Much better controlled.  Which is surprising off of carvedilol.  He is currently on amlodipine 10 mg daily and losartan 100 mg daily.  We will continue to monitor.  3.  Hyperlipidemia: Remains on atorvastatin 40 mg daily.  Follow-up labs per primary care who used to see next month.  Current medicines are reviewed at length with the patient today.  I have spent 25 mins dedicated to the care of this patient on the date of this encounter to include pre-visit review of records, assessment, management and diagnostic testing,with shared decision making.  Labs/ tests ordered today include: 7-day ZIO monitor.  Phill Myron. [place] Pugh, ANP, AACC    09/17/2021 1:56 PM    [facility] [address] Office [phone] Fax [phone]  Notice: This dictation was prepared with Dragon dictation along with smaller phrase technology. Any transcriptional errors that result from this process are unintentional and may not be corrected upon review.

## 2021-09-17 ENCOUNTER — Encounter: Payer: Self-pay | Admitting: Adult Health

## 2021-09-17 ENCOUNTER — Other Ambulatory Visit: Payer: Self-pay

## 2021-09-17 ENCOUNTER — Ambulatory Visit: Payer: Medicare HMO | Admitting: Adult Health

## 2021-09-17 ENCOUNTER — Ambulatory Visit (INDEPENDENT_AMBULATORY_CARE_PROVIDER_SITE_OTHER): Payer: Medicare HMO

## 2021-09-17 VITALS — BP 146/82 | HR 58 | Ht 70.0 in | Wt 214.8 lb

## 2021-09-17 DIAGNOSIS — R002 Palpitations: Secondary | ICD-10-CM | POA: Diagnosis not present

## 2021-09-17 DIAGNOSIS — R001 Bradycardia, unspecified: Secondary | ICD-10-CM

## 2021-09-17 DIAGNOSIS — E7800 Pure hypercholesterolemia, unspecified: Secondary | ICD-10-CM | POA: Diagnosis not present

## 2021-09-17 DIAGNOSIS — I10 Essential (primary) hypertension: Secondary | ICD-10-CM | POA: Diagnosis not present

## 2021-09-17 NOTE — Progress Notes (Unsigned)
Enrolled patient for a 7 day Zio XT monitor to be mailed to patients home  Dr Harding to read 

## 2021-09-17 NOTE — Patient Instructions (Signed)
Medication Instructions:  No Changes *If you need a refill on your cardiac medications before your next appointment, please call your pharmacy*   Lab Work: No Labs If you have labs (blood work) drawn today and your tests are completely normal, you will receive your results only by: [place] (if you have MyChart) OR A paper copy in the mail If you have any lab test that is abnormal or we need to change your treatment, we will call you to review the results.   Testing/Procedures: ZIO AT Long term monitor-Live Telemetry  Your physician has requested you wear a ZIO patch monitor for 7 days.  This is a single patch monitor. [employer] supplies one patch monitor per enrollment. Additional  stickers are not available.  Please do not apply patch if you will be having a Nuclear Stress Test, Echocardiogram, Cardiac CT, MRI,  or Chest Xray during the period you would be wearing the monitor. The patch cannot be worn during  these tests. You cannot remove and re-apply the ZIO AT patch monitor.  Your ZIO patch monitor will be mailed 3 day USPS to your address on file. It may take 3-5 days to  receive your monitor after you have been enrolled.  Once you have received your monitor, please review the enclosed instructions. Your monitor has  already been registered assigning a specific monitor serial # to you.   Billing and Patient Assistance Program information  Ronald Bond has been supplied with any insurance information on record for billing. [employer] offers a sliding scale Patient Assistance Program for patients without insurance, or whose  insurance does not completely cover the cost of the ZIO patch monitor. You must apply for the  Patient Assistance Program to qualify for the discounted rate. To apply, call [employer] at [phone],  select option 4, select option 2 , ask to apply for the Patient Assistance Program, (you can request an  interpreter if needed). [employer] will ask your household  income and how many people are in your  household. [employer] will quote your out-of-pocket cost based on this information. They will also be able  to set up a 12 month interest free payment plan if needed.  Applying the monitor   Shave hair from upper left chest.  Hold the abrader disc by orange tab. Rub the abrader in 40 strokes over left upper chest as indicated in  your monitor instructions.  Clean area with 4 enclosed alcohol pads. Use all pads to ensure the area is cleaned thoroughly. Let  dry.  Apply patch as indicated in monitor instructions. Patch will be placed under collarbone on left side of  chest with arrow pointing upward.  Rub patch adhesive wings for 2 minutes. Remove the white label marked "1". Remove the white label  marked "2". Rub patch adhesive wings for 2 additional minutes.  While looking in a mirror, press and release button in center of patch. A small green light will flash 3-4  times. This will be your only indicator that the monitor has been turned on.  Do not shower for the first 24 hours. You may shower after the first 24 hours.  Press the button if you feel a symptom. You will hear a small click. Record Date, Time and Symptom in  the Patient Log.   Starting the Gateway  In your kit there is a Hydrographic surveyor box the size of a cellphone. This is Airline pilot. It transmits all your  recorded data to [facility]. This box must  always stay within 10 feet of you. Open the box and push the *  button. There will be a light that blinks orange and then green a few times. When the light stops  blinking, the Gateway is connected to the ZIO patch. Call [employer] at [phone] to confirm your monitor is transmitting.  Returning your monitor  Remove your patch and place it inside the [place]. In the lower half of the Gateway there is a white  bag with prepaid postage on it. Place Gateway in bag and seal. Mail package back to [place] as soon as  possible. Your physician should  have your final report approximately 7 days after you have mailed back  your monitor. Call [place] at [phone] if you have questions regarding your ZIO AT  patch monitor. Call them immediately if you see an orange light blinking on your monitor.  If your monitor falls off in less than 4 days, contact our Monitor department at [phone]. If your  monitor becomes loose or falls off after 4 days call [employer] at [phone] for suggestions on  securing your monitor    Follow-Up: At [facility], you and your health needs are our priority.  As part of our continuing mission to provide you with exceptional heart care, we have created designated Provider Care Teams.  These Care Teams include your primary Cardiologist (physician) and Advanced Practice Providers (APPs -  Physician Assistants and Nurse Practitioners) who all work together to provide you with the care you need, when you need it.  We recommend signing up for the patient portal called "MyChart".  Sign up information is provided on this After Visit Summary.  MyChart is used to connect with patients for Virtual Visits (Telemedicine).  Patients are able to view lab/test results, encounter notes, upcoming appointments, etc.  Non-urgent messages can be sent to your provider as well.   To learn more about what you can do with MyChart, go to [email or web address].    Your next appointment:   2 month(s)  The format for your next appointment:   In Person  Provider:   Glenetta Hew, MD

## 2021-09-21 ENCOUNTER — Other Ambulatory Visit (HOSPITAL_BASED_OUTPATIENT_CLINIC_OR_DEPARTMENT_OTHER): Payer: Self-pay

## 2021-09-24 DIAGNOSIS — R002 Palpitations: Secondary | ICD-10-CM

## 2021-09-24 DIAGNOSIS — R001 Bradycardia, unspecified: Secondary | ICD-10-CM

## 2021-09-29 ENCOUNTER — Ambulatory Visit (INDEPENDENT_AMBULATORY_CARE_PROVIDER_SITE_OTHER): Payer: Medicare HMO

## 2021-09-29 ENCOUNTER — Other Ambulatory Visit: Payer: Self-pay

## 2021-09-29 DIAGNOSIS — N1831 Chronic kidney disease, stage 3a: Secondary | ICD-10-CM

## 2021-09-29 DIAGNOSIS — K2100 Gastro-esophageal reflux disease with esophagitis, without bleeding: Secondary | ICD-10-CM

## 2021-09-29 DIAGNOSIS — I10 Essential (primary) hypertension: Secondary | ICD-10-CM

## 2021-09-29 NOTE — Progress Notes (Deleted)
Chronic Care Management Pharmacy Note  09/29/2021 Name:  Ronald Bond MRN:  [id number] DOB:  12/08/1945  Summary: - Patients blood pressure in office today was 166/79 with a pulse of 43 BPM, reports that heart rate has always been low, denies any symptoms, patient actively following with cardiology  -Denies any issues or concerns since starting farxiga, reports no issues with cost  -Notes that dexliant is effective in control of acid reflux, but notes that it is a costly medication, felt that omeprazole was effective when he had taken prior  -Reports that asthma has been under control, has not used dulera in a number of months, reports to previous albuterol inhaler use as needed for wheezing  Recommendations/Changes made from today's visit: - Recommending for patient to split losartan dosing to 54m twice daily, patient will start to check blood pressure and HR at home at least 3 times weekly  -Finish current dexilant prescription then replace with omeprazole 437mdaily (lower cost) - Refill albuterol inhaler to use as needed for asthma   Subjective: Ronald FAULKNERs an [age].o. year old male who is a primary patient of JoJanith LimaMD.  The CCM team was consulted for assistance with disease management and care coordination needs.    Engaged with patient by telephone for follow up visit in response to provider referral for pharmacy case management and/or care coordination services.   Consent to Services:  The patient was given the following information about Chronic Care Management services today, agreed to services, and gave verbal consent: 1. CCM service includes personalized support from designated clinical staff supervised by the primary care provider, including individualized plan of care and coordination with other care providers 2. 24/7 contact phone numbers for assistance for urgent and routine care needs. 3. Service will only be billed when office clinical staff spend 20  minutes or more in a month to coordinate care. 4. Only one practitioner may furnish and bill the service in a calendar month. 5.The patient may stop CCM services at any time (effective at the end of the month) by phone call to the office staff. 6. The patient will be responsible for cost sharing (co-pay) of up to 20% of the service fee (after annual deductible is met). Patient agreed to services and consent obtained.  Patient Care Team: JoJanith LimaMD as PCP - General HaEllyn HackaLeonie GreenMD as PCP - Cardiology (Cardiology) HaLeonie ManMD as Consulting Physician (Cardiology) DaFranchot GalloMD as Consulting Physician (Urology) YoDeneise LeverMD as Consulting Physician (Pulmonary Disease) groat (Dentistry) [facility]Tomasa BlaseRPOr[facility] Pharmacist (Pharmacist)  Recent office visits:  09/06/2021 - Dr. JoJenny Reichmann dizziness and unsteady - thought to be due to bradycardia - stopped carvedilol due to bradycardia    Recent consult visits:  09/17/2021 - KaJory SimsP - Cardiology - bp better controlled off of carvedilol - will continue off of carvedilol - zio monitor ordered - follow up in 2 months  Hospital visits:  None in previous 6 months  Objective:  Lab Results  Component Value Date   CREATININE 1.54 (H) 09/06/2021   BUN 21 09/06/2021   GFR 43.77 (L) 09/06/2021   GFRNONAA 46 (L) 05/17/2019   GFRAA 53 (L) 05/17/2019   NA 138 09/06/2021   K 4.0 09/06/2021   CALCIUM 9.5 09/06/2021   CO2 27 09/06/2021   GLUCOSE 110 (H) 09/06/2021    Lab Results  Component Value Date/Time   HGBA1C 6.5  06/21/2021 09:52 AM   HGBA1C 6.2 11/30/2020 10:23 AM   HGBA1C 5.9 10/15/2018 09:50 AM   GFR 43.77 (L) 09/06/2021 09:12 AM   GFR 45.98 (L) 06/21/2021 09:52 AM   MICROALBUR 21.0 (H) 11/30/2020 10:23 AM    Last diabetic Eye exam:  Lab Results  Component Value Date/Time   HMDIABEYEEXA No Retinopathy 12/12/2019 12:00 AM    Last diabetic Foot exam:  No results found for: HMDIABFOOTEX    Lab Results  Component Value Date   CHOL 171 11/30/2020   HDL 63.70 11/30/2020   LDLCALC 89 11/30/2020   LDLDIRECT 75.0 03/08/2016   TRIG 95.0 11/30/2020   CHOLHDL 3 11/30/2020    Hepatic Function Latest Ref Rng & Units 11/30/2020 10/15/2018 12/07/2017  Total Protein 6.0 - 8.3 g/dL 7.5 7.5 7.5  Albumin 3.5 - 5.2 g/dL 4.7 4.5 4.3  AST 0 - 37 U/L 15 19 11   ALT 0 - 53 U/L 17 20 13   Alk Phosphatase 39 - 117 U/L 69 59 70  Total Bilirubin 0.2 - 1.2 mg/dL 0.5 0.5 0.6  Bilirubin, Direct 0.0 - 0.3 mg/dL 0.1 - -    Lab Results  Component Value Date/Time   TSH 1.25 06/21/2021 09:52 AM   TSH 3.64 11/30/2020 10:23 AM   FREET4 1.14 07/13/2020 09:51 AM   FREET4 1.01 01/10/2020 08:43 AM    CBC Latest Ref Rng & Units 06/21/2021 11/30/2020 05/12/2020  WBC 4.0 - 10.5 K/uL 7.1 7.3 6.9  Hemoglobin 13.0 - 17.0 g/dL 12.3(L) 12.9(L) 11.5(L)  Hematocrit 39.0 - 52.0 % 37.0(L) 38.5(L) 34.0(L)  Platelets 150.0 - 400.0 K/uL 244.0 261.0 255.0    Lab Results  Component Value Date/Time   VD25OH 34.37 10/15/2018 10:32 AM    Clinical ASCVD: No  The 10-year ASCVD risk score (Arnett DK, et al., 2019) is: 41.9%   Values used to calculate the score:     Age: 75 years     Sex: Male     Is Non-Hispanic African American: Yes     Diabetic: Yes     Tobacco smoker: No     Systolic Blood Pressure: 314 mmHg     Is BP treated: Yes     HDL Cholesterol: 63.7 mg/dL     Total Cholesterol: 171 mg/dL    Depression screen [facility] 2/9 06/21/2021 09/18/2020 05/12/2020  Decreased Interest 0 0 0  Down, Depressed, Hopeless 0 0 0  PHQ - 2 Score 0 0 0  Some recent data might be hidden     Social History   Tobacco Use  Smoking Status Never  Smokeless Tobacco Never   BP Readings from Last 3 Encounters:  09/17/21 (!) 146/82  09/06/21 (!) 150/70  06/21/21 (!) 170/80   Pulse Readings from Last 3 Encounters:  09/17/21 (!) 58  09/06/21 (!) 47  06/21/21 (!) 47   Wt Readings from Last 3 Encounters:  09/17/21 214 lb  12.8 oz (97.4 kg)  09/06/21 218 lb (98.9 kg)  06/21/21 217 lb (98.4 kg)   BMI Readings from Last 3 Encounters:  09/17/21 30.82 kg/m  09/06/21 31.28 kg/m  06/21/21 31.14 kg/m    Assessment/Interventions: Review of patient past medical history, allergies, medications, health status, including review of consultants reports, laboratory and other test data, was performed as part of comprehensive evaluation and provision of chronic care management services.   SDOH:  (Social Determinants of Health) assessments and interventions performed: Yes  SDOH Screenings   Alcohol Screen: Not on file  Depression (PHQ2-9):  Low Risk    PHQ-2 Score: 0  Financial Resource Strain: Not on file  Food Insecurity: Not on file  Housing: Not on file  Physical Activity: Not on file  Social Connections: Not on file  Stress: Not on file  Tobacco Use: Low Risk    Smoking Tobacco Use: Never   Smokeless Tobacco Use: Never   Passive Exposure: Not on file  Transportation Needs: Not on file    [place]  Allergies  Allergen Reactions   Lisinopril Other (See Comments)    Edema in face    Medications Reviewed Today     Reviewed by Lendon Colonel, NP (Nurse Practitioner) on 09/17/21 at [place] List Status: <None>   Medication Order Taking? Sig Documenting Provider Last Dose Status Informant  albuterol (VENTOLIN HFA) 108 (90 Base) MCG/ACT inhaler [id number] Yes Inhale 2 puffs into the lungs every 6 (six) hours as needed for wheezing or shortness of breath. Janith Lima, MD Taking Active   amLODipine (NORVASC) 10 MG tablet [id number] Yes TAKE 1 TABLET BY MOUTH EVERY DAY Janith Lima, MD Taking Active   atorvastatin (LIPITOR) 40 MG tablet [id number] Yes TAKE 1 TABLET BY MOUTH EVERY DAY Janith Lima, MD Taking Active   cetirizine (ZYRTEC) 5 MG tablet [id number] Yes Take 5 mg by mouth daily. [provider] Taking Active   cholecalciferol (VITAMIN D3) 25 MCG (1000 UNIT) tablet [id number]  Yes Take 1,000 Units by mouth daily. [provider] Taking Active   COVID-19 mRNA bivalent vaccine, Pfizer, injection [id number] Yes Inject into the muscle. Carlyle Basques, MD Taking Active   dapagliflozin propanediol (FARXIGA) 10 MG TABS tablet [id number] Yes Take 1 tablet (10 mg total) by mouth daily before breakfast. Janith Lima, MD Taking Active   docusate sodium (COLACE) 100 MG capsule [id number] Yes Take 100 mg by mouth 2 (two) times daily. [provider] Taking Active   fluticasone (FLONASE) 50 MCG/ACT nasal spray [id number] Yes Place 2 sprays into both nostrils daily. Janith Lima, MD Taking Active Self  indapamide (LOZOL) 1.25 MG tablet [id number] Yes TAKE 1 TABLET BY MOUTH DAILY. Janith Lima, MD Taking Active   KLOR-CON M20 20 MEQ tablet [id number] Yes TAKE 1 TABLET BY MOUTH TWICE A DAY Janith Lima, MD Taking Active   levothyroxine (SYNTHROID) 112 MCG tablet [id number] Yes Take 1 tablet (112 mcg total) by mouth daily. Shamleffer, Melanie Crazier, MD Taking Active   losartan (COZAAR) 100 MG tablet [id number] Yes TAKE 1 TABLET BY MOUTH EVERY DAY Janith Lima, MD Taking Active   mometasone-formoterol [facility]) 100-5 MCG/ACT Hollie Salk [id number] Yes Inhale 2 puffs into the lungs 2 (two) times daily. Janith Lima, MD Taking Active   Multiple Vitamin (MULTIVITAMIN) tablet [id number] Yes Take 1 tablet by mouth daily. [provider] Taking Active Self  omeprazole (PRILOSEC) 40 MG capsule [id number] Yes Take 1 capsule (40 mg total) by mouth daily. Janith Lima, MD Taking Active   Polyethyl Glycol-Propyl Glycol 0.4-0.3 % SOLN [id number] Yes Apply 1 drop to eye as needed. [provider] Taking Active   tamsulosin (FLOMAX) 0.4 MG CAPS capsule [id number] Yes TAKE 1 CAPSULE BY MOUTH  DAILY AFTER SUPPER Janith Lima, MD Taking Active   vitamin C (ASCORBIC ACID) 500 MG tablet [id number] Yes Take 500 mg by mouth daily. [provider] Taking Active              Patient Active Problem List   Diagnosis  Date Noted   Encounter for general adult medical examination with abnormal findings 06/22/2021   Pill dysphagia 06/21/2021   Bradycardia 11/30/2020   Graves' orbitopathy 07/13/2020   Type II diabetes mellitus with manifestations ([place]) 05/13/2020   Diuretic-induced hypokalemia 12/16/2019   Graves disease 10/07/2019   Postablative hypothyroidism 10/07/2019   Thyroid-related proptosis 08/15/2019   Chronic renal disease, stage 3, moderately decreased glomerular filtration rate (GFR) between 30-59 mL/min/1.73 square meter (HCC) 08/15/2019   Long-term current use of opiate analgesic 05/14/2018   Bilateral epiphora 11/16/2017   Thiamine deficiency 06/08/2017   Obesity (BMI 30.0-34.9) 06/04/2017   Hypertensive heart disease without CHF (congestive heart failure) 12/25/2016   Right cervical radiculopathy 06/16/2016   Iron deficiency anemia 03/08/2016   Spinal stenosis in cervical region 08/25/2015   Allergic eczema 08/07/2014   Obstructive sleep apnea 11/12/2013   Mild intermittent asthma 06/03/2013   Routine general medical examination at a health care facility 04/02/2013   Herniated lumbar disc without myelopathy 10/18/2012   Allergic rhinitis 03/10/2011   Cardiomegaly 10/18/2010   ERECTILE DYSFUNCTION, NON-ORGANIC, MILD 06/29/2009   Hyperlipidemia with target LDL less than 100 05/28/2009   Essential hypertension, benign 05/28/2009   GERD 05/28/2009   BPH associated with nocturia 05/28/2009   Primary osteoarthritis of right knee 05/28/2009    Immunization History  Administered Date(s) Administered   Fluad Quad(high Dose 65+) 07/27/2019   Influenza Split 10/24/2011   Influenza Whole 08/19/2009, 07/23/2010   Influenza, High Dose Seasonal PF 10/21/2013, 09/27/2016, 09/07/2017, 09/14/2018   Influenza,inj,Quad PF,6+ Mos 08/07/2014, 08/25/2015   Influenza-Unspecified 08/28/2020, 08/18/2021   PFIZER Comirnaty(Gray Top)Covid-19  Tri-Sucrose Vaccine 04/17/2021   PFIZER(Purple Top)SARS-COV-2 Vaccination 01/27/2020, 02/25/2020, 08/28/2020   Pfizer Covid-19 Vaccine Bivalent Booster 46yr & up 09/06/2021   Pneumococcal Conjugate-13 08/25/2015   Pneumococcal Polysaccharide-23 11/28/2004, 03/08/2016   Td 11/28/2004   Tdap 08/25/2015   Zoster Recombinat (Shingrix) 08/23/2019   Zoster, Live 02/24/2011    Conditions to be addressed/monitored:  Hypertension, Hyperlipidemia, Prediabetes, Heart Failure, GERD, Asthma, Chronic Kidney Disease, Hypothyroidism, BPH, and Allergic Rhinitis  There are no care plans that you recently modified to display for this patient.      Medication Assistance: None required.  Patient affirms current coverage meets needs.  Patient's preferred pharmacy is:  [address] [address]Phone: [phone]: [phone] [id number] [address]one: [phone]: [id number] [place] [address]one: [phone]: [id number]ne: [phone]: [phone] pill box? Yes Pt endorses 100% compliance  Care Plan and Follow Up Patient Decision:  Patient agrees to Care Plan and Follow-up.  Plan: Telephone follow up appointment with care management team member scheduled for:  2 months and The patient has been provided with contact information for the care management team and has been advised to call with any health related questions or concerns.   DaniTomasa BlasearmD Clinical Pharmacist, [place]urrent chart prep  = 9 minutes

## 2021-09-29 NOTE — Patient Instructions (Signed)
Ronald Bond,  It was great to talk to you today!  Please call me with any questions or concerns.  Visit Information  Patient verbalizes understanding of instructions provided today and agrees to view in [place].   Telephone follow up appointment with care management team member scheduled for: The patient has been provided with contact information for the care management team and has been advised to call with any health related questions or concerns.   Tomasa Blase, PharmD Clinical Pharmacist, [place]

## 2021-09-29 NOTE — Progress Notes (Signed)
Chronic Care Management Pharmacy Note  09/29/2021 Name:  Ronald Bond MRN:  [id number] DOB:  [birth year]  Summary: -Patient reports that since he has discontinued carvedilol he feels that he has been doing much better, no longer experiencing the lightheadedness/ dizziness that he was having before  -Patient has not been able to monitor BP at home as the BP cuff he has is not working properly, but since stopping coreg, BP had improved with last OV  -Patient reports that omeprazole has been effective at controlling GERD - is less expensive than dexilant   Recommendations/Changes made from today's visit: - Recommending no medication changes at this time, patient to reach out should he experience any issues with his medications   Subjective: JSAON Bond is an 75 y.o. year old male who is a primary patient of Ronald Lima, MD.  The CCM team was consulted for assistance with disease management and care coordination needs.    Engaged with patient by telephone for follow up visit in response to provider referral for pharmacy case management and/or care coordination services.   Consent to Services:  The patient was given the following information about Chronic Care Management services today, agreed to services, and gave verbal consent: 1. CCM service includes personalized support from designated clinical staff supervised by the primary care provider, including individualized plan of care and coordination with other care providers 2. 24/7 contact phone numbers for assistance for urgent and routine care needs. 3. Service will only be billed when office clinical staff spend 20 minutes or more in a month to coordinate care. 4. Only one practitioner may furnish and bill the service in a calendar month. 5.The patient may stop CCM services at any time (effective at the end of the month) by phone call to the office staff. 6. The patient will be responsible for cost sharing (co-pay) of up to 20% of the  service fee (after annual deductible is met). Patient agreed to services and consent obtained.  Patient Care Team: Ronald Lima, MD as PCP - General Ellyn Hack Leonie Green, MD as PCP - Cardiology (Cardiology) Leonie Man, MD as Consulting Physician (Cardiology) Franchot Gallo, MD as Consulting Physician (Urology) Deneise Lever, MD as Consulting Physician (Pulmonary Disease) groat (Dentistry) Tomasa Blase, [facility] as Pharmacist (Pharmacist)  Recent office visits:  09/06/2021 - Dr. Jenny Reichmann - dizziness and unsteady - thought to be due to bradycardia - stopped carvedilol due to bradycardia    Recent consult visits:  09/17/2021 - Jory Sims NP - Cardiology - bp better controlled off of carvedilol - will continue off of carvedilol - zio monitor ordered - follow up in 2 months  Hospital visits:  None in previous 6 months  Objective:  Lab Results  Component Value Date   CREATININE 1.54 (H) 09/06/2021   BUN 21 09/06/2021   GFR 43.77 (L) 09/06/2021   GFRNONAA 46 (L) 05/17/2019   GFRAA 53 (L) 05/17/2019   NA 138 09/06/2021   K 4.0 09/06/2021   CALCIUM 9.5 09/06/2021   CO2 27 09/06/2021   GLUCOSE 110 (H) 09/06/2021    Lab Results  Component Value Date/Time   HGBA1C 6.5 06/21/2021 09:52 AM   HGBA1C 6.2 11/30/2020 10:23 AM   HGBA1C 5.9 10/15/2018 09:50 AM   GFR 43.77 (L) 09/06/2021 09:12 AM   GFR 45.98 (L) 06/21/2021 09:52 AM   MICROALBUR 21.0 (H) 11/30/2020 10:23 AM    Last diabetic Eye exam:  Lab Results  Component Value Date/Time  HMDIABEYEEXA No Retinopathy 12/12/2019 12:00 AM    Last diabetic Foot exam:  No results found for: HMDIABFOOTEX   Lab Results  Component Value Date   CHOL 171 11/30/2020   HDL 63.70 11/30/2020   LDLCALC 89 11/30/2020   LDLDIRECT 75.0 03/08/2016   TRIG 95.0 11/30/2020   CHOLHDL 3 11/30/2020    Hepatic Function Latest Ref Rng & Units 11/30/2020 10/15/2018 12/07/2017  Total Protein 6.0 - 8.3 g/dL 7.5 7.5 7.5  Albumin 3.5 - 5.2  g/dL 4.7 4.5 4.3  AST 0 - 37 U/L 15 19 11   ALT 0 - 53 U/L 17 20 13   Alk Phosphatase 39 - 117 U/L 69 59 70  Total Bilirubin 0.2 - 1.2 mg/dL 0.5 0.5 0.6  Bilirubin, Direct 0.0 - 0.3 mg/dL 0.1 - -    Lab Results  Component Value Date/Time   TSH 1.25 06/21/2021 09:52 AM   TSH 3.64 11/30/2020 10:23 AM   FREET4 1.14 07/13/2020 09:51 AM   FREET4 1.01 01/10/2020 08:43 AM    CBC Latest Ref Rng & Units 06/21/2021 11/30/2020 05/12/2020  WBC 4.0 - 10.5 K/uL 7.1 7.3 6.9  Hemoglobin 13.0 - 17.0 g/dL 12.3(L) 12.9(L) 11.5(L)  Hematocrit 39.0 - 52.0 % 37.0(L) 38.5(L) 34.0(L)  Platelets 150.0 - 400.0 K/uL 244.0 261.0 255.0    Lab Results  Component Value Date/Time   VD25OH 34.37 10/15/2018 10:32 AM    Clinical ASCVD: No  The 10-year ASCVD risk score (Arnett DK, et al., 2019) is: 41.9%   Values used to calculate the score:     Age: 56 years     Sex: Male     Is Non-Hispanic African American: Yes     Diabetic: Yes     Tobacco smoker: No     Systolic Blood Pressure: 111 mmHg     Is BP treated: Yes     HDL Cholesterol: 63.7 mg/dL     Total Cholesterol: 171 mg/dL    Depression screen [facility] 2/9 06/21/2021 09/18/2020 05/12/2020  Decreased Interest 0 0 0  Down, Depressed, Hopeless 0 0 0  PHQ - 2 Score 0 0 0  Some recent data might be hidden     Social History   Tobacco Use  Smoking Status Never  Smokeless Tobacco Never   BP Readings from Last 3 Encounters:  09/17/21 (!) 146/82  09/06/21 (!) 150/70  06/21/21 (!) 170/80   Pulse Readings from Last 3 Encounters:  09/17/21 (!) 58  09/06/21 (!) 47  06/21/21 (!) 47   Wt Readings from Last 3 Encounters:  09/17/21 214 lb 12.8 oz (97.4 kg)  09/06/21 218 lb (98.9 kg)  06/21/21 217 lb (98.4 kg)   BMI Readings from Last 3 Encounters:  09/17/21 30.82 kg/m  09/06/21 31.28 kg/m  06/21/21 31.14 kg/m    Assessment/Interventions: Review of patient past medical history, allergies, medications, health status, including review of consultants  reports, laboratory and other test data, was performed as part of comprehensive evaluation and provision of chronic care management services.   SDOH:  (Social Determinants of Health) assessments and interventions performed: Yes  SDOH Screenings   Alcohol Screen: Not on file  Depression (PHQ2-9): Low Risk    PHQ-2 Score: 0  Financial Resource Strain: Not on file  Food Insecurity: Not on file  Housing: Not on file  Physical Activity: Not on file  Social Connections: Not on file  Stress: Not on file  Tobacco Use: Low Risk    Smoking Tobacco Use: Never  Smokeless Tobacco Use: Never   Passive Exposure: Not on file  Transportation Needs: Not on file    [place]  Allergies  Allergen Reactions   Lisinopril Other (See Comments)    Edema in face    Medications Reviewed Today     Reviewed by Lendon Colonel, NP (Nurse Practitioner) on 09/17/21 at [place] List Status: <None>   Medication Order Taking? Sig Documenting Provider Last Dose Status Informant  albuterol (VENTOLIN HFA) 108 (90 Base) MCG/ACT inhaler [id number] Yes Inhale 2 puffs into the lungs every 6 (six) hours as needed for wheezing or shortness of breath. Ronald Lima, MD Taking Active   amLODipine (NORVASC) 10 MG tablet [id number] Yes TAKE 1 TABLET BY MOUTH EVERY DAY Ronald Lima, MD Taking Active   atorvastatin (LIPITOR) 40 MG tablet [id number] Yes TAKE 1 TABLET BY MOUTH EVERY DAY Ronald Lima, MD Taking Active   cetirizine (ZYRTEC) 5 MG tablet [id number] Yes Take 5 mg by mouth daily. [provider] Taking Active   cholecalciferol (VITAMIN D3) 25 MCG (1000 UNIT) tablet [id number] Yes Take 1,000 Units by mouth daily. [provider] Taking Active   COVID-19 mRNA bivalent vaccine, Pfizer, injection [id number] Yes Inject into the muscle. Carlyle Basques, MD Taking Active   dapagliflozin propanediol (FARXIGA) 10 MG TABS tablet [id number] Yes Take 1 tablet (10 mg total) by mouth daily before  breakfast. Ronald Lima, MD Taking Active   docusate sodium (COLACE) 100 MG capsule [id number] Yes Take 100 mg by mouth 2 (two) times daily. [provider] Taking Active   fluticasone (FLONASE) 50 MCG/ACT nasal spray [id number] Yes Place 2 sprays into both nostrils daily. Ronald Lima, MD Taking Active Self  indapamide (LOZOL) 1.25 MG tablet [id number] Yes TAKE 1 TABLET BY MOUTH DAILY. Ronald Lima, MD Taking Active   KLOR-CON M20 20 MEQ tablet [id number] Yes TAKE 1 TABLET BY MOUTH TWICE A DAY Ronald Lima, MD Taking Active   levothyroxine (SYNTHROID) 112 MCG tablet [id number] Yes Take 1 tablet (112 mcg total) by mouth daily. Shamleffer, Melanie Crazier, MD Taking Active   losartan (COZAAR) 100 MG tablet [id number] Yes TAKE 1 TABLET BY MOUTH EVERY DAY Ronald Lima, MD Taking Active   mometasone-formoterol [facility]) 100-5 MCG/ACT Hollie Salk [id number] Yes Inhale 2 puffs into the lungs 2 (two) times daily. Ronald Lima, MD Taking Active   Multiple Vitamin (MULTIVITAMIN) tablet [id number] Yes Take 1 tablet by mouth daily. [provider] Taking Active Self  omeprazole (PRILOSEC) 40 MG capsule [id number] Yes Take 1 capsule (40 mg total) by mouth daily. Ronald Lima, MD Taking Active   Polyethyl Glycol-Propyl Glycol 0.4-0.3 % SOLN [id number] Yes Apply 1 drop to eye as needed. [provider] Taking Active   tamsulosin (FLOMAX) 0.4 MG CAPS capsule [id number] Yes TAKE 1 CAPSULE BY MOUTH  DAILY AFTER SUPPER Ronald Lima, MD Taking Active   vitamin C (ASCORBIC ACID) 500 MG tablet [id number] Yes Take 500 mg by mouth daily. [provider] Taking Active             Patient Active Problem List   Diagnosis Date Noted   Encounter for general adult medical examination with abnormal findings 06/22/2021   Pill dysphagia 06/21/2021   Bradycardia 11/30/2020   Graves' orbitopathy 07/13/2020   Type II diabetes mellitus with manifestations ([place]) 05/13/2020    Diuretic-induced hypokalemia 12/16/2019   Graves disease 10/07/2019   Postablative hypothyroidism 10/07/2019   Thyroid-related proptosis  08/15/2019   Chronic renal disease, stage 3, moderately decreased glomerular filtration rate (GFR) between 30-59 mL/min/1.73 square meter (HCC) 08/15/2019   Long-term current use of opiate analgesic 05/14/2018   Bilateral epiphora 11/16/2017   Thiamine deficiency 06/08/2017   Obesity (BMI 30.0-34.9) 06/04/2017   Hypertensive heart disease without CHF (congestive heart failure) 12/25/2016   Right cervical radiculopathy 06/16/2016   Iron deficiency anemia 03/08/2016   Spinal stenosis in cervical region 08/25/2015   Allergic eczema 08/07/2014   Obstructive sleep apnea 11/12/2013   Mild intermittent asthma 06/03/2013   Routine general medical examination at a health care facility 04/02/2013   Herniated lumbar disc without myelopathy 10/18/2012   Allergic rhinitis 03/10/2011   Cardiomegaly 10/18/2010   ERECTILE DYSFUNCTION, NON-ORGANIC, MILD 06/29/2009   Hyperlipidemia with target LDL less than 100 05/28/2009   Essential hypertension, benign 05/28/2009   GERD 05/28/2009   BPH associated with nocturia 05/28/2009   Primary osteoarthritis of right knee 05/28/2009    Immunization History  Administered Date(s) Administered   Fluad Quad(high Dose 65+) 07/27/2019   Influenza Split 10/24/2011   Influenza Whole 08/19/2009, 07/23/2010   Influenza, High Dose Seasonal PF 10/21/2013, 09/27/2016, 09/07/2017, 09/14/2018   Influenza,inj,Quad PF,6+ Mos 08/07/2014, 08/25/2015   Influenza-Unspecified 08/28/2020, 08/18/2021   PFIZER Comirnaty(Gray Top)Covid-19 Tri-Sucrose Vaccine 04/17/2021   PFIZER(Purple Top)SARS-COV-2 Vaccination 01/27/2020, 02/25/2020, 08/28/2020   Pfizer Covid-19 Vaccine Bivalent Booster 62yr & up 09/06/2021   Pneumococcal Conjugate-13 08/25/2015   Pneumococcal Polysaccharide-23 11/28/2004, 03/08/2016   Td 11/28/2004   Tdap 08/25/2015    Zoster Recombinat (Shingrix) 08/23/2019   Zoster, Live 02/24/2011    Conditions to be addressed/monitored:  Hypertension, Hyperlipidemia, Prediabetes, Heart Failure, GERD, Asthma, Chronic Kidney Disease, Hypothyroidism, BPH, and Allergic Rhinitis  Care Plan : CCM Care Plan  Updates made by STomasa Blase RPH since 09/29/2021 12:00 AM     Problem: HTN, HF, HLD, CKD, Hypothyroidism, Allergic Rhinitis, BPH, GERD, Prediabetes   Priority: High  Onset Date: 07/02/2021     Long-Range Goal: Disease Management   Start Date: 07/02/2021  Expected End Date: 01/02/2022  Recent Progress: On track  Priority: High  Note:   Current Barriers:  Unable to independently monitor therapeutic efficacy Unable to achieve control of Blood pressure   Pharmacist Clinical Goal(s):  Patient will achieve adherence to monitoring guidelines and medication adherence to achieve therapeutic efficacy achieve control of blood pressure as evidenced by blood pressure logs maintain control of LDL, asthma, hypothyroidism, CKD, and A1c as evidenced by next lab results, and asthma exacerbation frequency  through collaboration with PharmD and provider.   Interventions: 1:1 collaboration with JJanith Lima MD regarding development and update of comprehensive plan of care as evidenced by provider attestation and co-signature Inter-disciplinary care team collaboration (see longitudinal plan of care) Comprehensive medication review performed; medication list updated in electronic medical record  Hyperlipidemia: (LDL goal < 100) -Controlled Lab Results  Component Value Date   LHomer89 11/30/2020  -Current treatment: Atorvastatin 449m- 1 tablet daily  -Medications previously tried: rosuvastatin, pravastatin   -Current dietary patterns: reports that he is reducing amount of foods high in cholesterol that he is eating, increasing lean protein intake -Current exercise habits: walking for about 30 minutes daily  -Educated  on Cholesterol goals;  Benefits of statin for ASCVD risk reduction; Importance of limiting foods high in cholesterol; Exercise goal of 150 minutes per week; -Counseled on diet and exercise extensively Recommended to continue current medication  Heart Failure (Goal: manage symptoms and prevent  exacerbations) / Hypertension (BP goal <140/90) -Not ideally controlled -Last ejection fraction: 47% (Date: 06/2013 - estimated via myoview - thought to be underestimation) -NYHA Class: II (slight limitation of activity)-III -Current treatment: Indapamide 1.4m - 1 tablet daily  Losartan 1067m- 1 tablet daily  Amlodipine 1068m 1 tablet daily  Carvedilol 6.30m62m1 tablet twice daily - stopped 10/10 due to bradycardia   Potassium Chloride 20 mEq - 1 tablet twice daily  Last potassium level: 4.0mEq24m -Medications previously tried: valsartan, hydrochlorothiazide, azilsartan,   -Current home BP/HR readings: n/a - agreeable to start checking - in office todat was 166/79 with HR of 43 -Current dietary habits: reports that he does not salt his food, tries to follow low sodium diet -Current exercise habits: walking daily for 30 minutes, plans to start using gym more often and becoming more active  -Educated on Benefits of medications for managing symptoms and prolonging life Proper diuretic administration and potassium supplementation Importance of blood pressure control -Counseled on diet and exercise extensively Recommended to split losartan dosing to 50mg 80me daily   Asthma (Goal: control symptoms and prevent exacerbations) -Controlled -Current treatment  Mometasone-Formoterol (Dulera) 100-5mcg -33mpuffs twice daily - not currently using  -Medications previously tried: albuterol HFA inhaler  -Exacerbations requiring treatment in last 6 months: n/a -Patient denies consistent use of maintenance inhaler -Frequency of rescue inhaler use: n/a -Counseled on Proper inhaler technique; Benefits of  consistent maintenance inhaler use When to use rescue inhaler Differences between maintenance and rescue inhalers -Recommended for patient to continue hold of dulera as he has not used in a number of months and has not had an exacerbation, patient would like albuterol inhaler to have if needed will send in for refill of rescue inhaler  Hypothyroidism (Goal: Maintenance of euthyroid levels) -Controlled -Last TSH level: 1.25uIU/mL -Current treatment  Levothyroxine 112mcg -59mablet daily  -Medications previously tried: n/a  -Recommended to continue current medication  GERD (Goal: Prevention/ control of acid reflux) -Controlled -Current treatment  Omeprazole 40mg - 11msule daily  -Medications previously tried: Dexlansoprazole, lansoprazole  -Counseled on diet and exercise extensively Recommended for patient to restart omeprazole 40mg dail70me to cost of dexliant   BPH (Goal: Prevention / treatment of urinary symptoms) -Controlled -Current treatment  Tamsulosin 0.4mg - 1 ca104mle daily  -Medications previously tried: oxybutynin, dutasteride, solifenacin  -Recommended to continue current medication  Prediabetes (A1c goal <6.5%) -Controlled -Last A1c: 6.5% (06/21/2021) - Farixa 10mg daily 65mted for renal protection - would expect for A1c to decrease with next check -Current meal patterns:  breakfast: eggs with toast, yogurt, coffee   lunch: tuna salad, chicken wings, vegetables  dinner: chicken, vegetable stew / soup snacks: does not typically eat drinks: apple / cranberry juice, orange juice -Current exercise: walking for 30 minutes daily  -Educated on A1c and blood sugar goals; Complications of diabetes including kidney damage, retinal damage, and cardiovascular disease; Exercise goal of 150 minutes per week; Benefits of weight loss; -Counseled to check feet daily and get yearly eye exams -Counseled on diet and exercise extensively Recommended to continue current  medication  Chronic Kidney Disease (Goal: Prevention of disease progression) -Controlled -Last eGFR: 45.98 mL/min (06/21/2021) -Last CrCl: 50.8 mL/min  -Current treatment  Farxiga 10mg - 1 tab50mdaily  -Medications previously tried: n/a  -Recommended avoidance of nephrotoxic agents, discussed importance of adequate BP and BG control to avoid kidney damage   Allergic Rhinitis (Goal: treament / control of allergies) -Controlled -  Current treatment  Cetirizine 31m - 1 tablet daily  Fluticasone 544m/act - 2 sprays into each nostril once daily if needed  -Medications previously tried: n/a  -Recommended to continue current medication   Health Maintenance -Vaccine gaps: Shingles and Flu vaccine  -Current therapy:  Multivitamin - 1 tablet daily  Vitamin C 50065m 1 tablet daily  Vitamin D3 1000 units - 1 tablet daily  Docusate 100m39m1 capsule twice daily  Systane Eye drops - 1 drop into each eye as needed for dry eyes  -Educated on Cost vs benefit of each product must be carefully weighed by individual consumer -Patient is satisfied with current therapy and denies issues -Recommended to continue current medication  Patient Goals/Self-Care Activities Patient will:  - take medications as prescribed check blood pressure 3 times weekly, document, and provide at future appointments target a minimum of 150 minutes of moderate intensity exercise weekly engage in dietary modifications by reducing sodium intake / moderation of carb intake / reduction of intake in foods high in cholesterol  Follow Up Plan: Telephone follow up appointment with care management team member scheduled for: The patient has been provided with contact information for the care management team and has been advised to call with any health related questions or concerns.         Medication Assistance: None required.  Patient affirms current coverage meets needs.  Patient's preferred pharmacy is:  [facility] [id number]: [phone]: [phone]/pharmacy [id number] [facility]: [phone]/pharmacy #[id number] [place] [address] [address]: [phone] #[id number] [place], [place] - [address] [phone] pill box? Yes Pt endorses 100% compliance  Care Plan and Follow Up Patient Decision:  Patient agrees to Care Plan and Follow-up.  Plan: Telephone follow up appointment with care management team member scheduled for:  2 months and The patient has been provided with contact information for the care management team and has been advised to call with any health related questions or concerns.   Edin Kon Tomasa BlaseD Clinical Pharmacist, [place]

## 2021-10-01 ENCOUNTER — Telehealth: Payer: Medicare HMO

## 2021-10-01 DIAGNOSIS — H524 Presbyopia: Secondary | ICD-10-CM | POA: Diagnosis not present

## 2021-10-06 DIAGNOSIS — R002 Palpitations: Secondary | ICD-10-CM | POA: Diagnosis not present

## 2021-10-06 DIAGNOSIS — R001 Bradycardia, unspecified: Secondary | ICD-10-CM | POA: Diagnosis not present

## 2021-10-07 ENCOUNTER — Ambulatory Visit: Payer: Medicare HMO | Admitting: Internal Medicine

## 2021-10-07 LAB — HM DIABETES EYE EXAM

## 2021-10-08 DIAGNOSIS — H0520 Unspecified exophthalmos: Secondary | ICD-10-CM | POA: Diagnosis not present

## 2021-10-20 ENCOUNTER — Encounter: Payer: Self-pay | Admitting: Cardiology

## 2021-10-20 NOTE — Telephone Encounter (Signed)
Returned pts call and informed him that monitor results are not yet available, but we will be sure to give him a call when they are.

## 2021-10-22 DIAGNOSIS — E0500 Thyrotoxicosis with diffuse goiter without thyrotoxic crisis or storm: Secondary | ICD-10-CM | POA: Diagnosis not present

## 2021-10-27 DIAGNOSIS — I10 Essential (primary) hypertension: Secondary | ICD-10-CM

## 2021-10-27 DIAGNOSIS — N1831 Chronic kidney disease, stage 3a: Secondary | ICD-10-CM

## 2021-10-28 ENCOUNTER — Ambulatory Visit (INDEPENDENT_AMBULATORY_CARE_PROVIDER_SITE_OTHER): Payer: Medicare HMO | Admitting: Internal Medicine

## 2021-10-28 ENCOUNTER — Encounter: Payer: Self-pay | Admitting: Internal Medicine

## 2021-10-28 ENCOUNTER — Other Ambulatory Visit: Payer: Self-pay

## 2021-10-28 VITALS — BP 138/76 | HR 66 | Temp 98.0°F | Resp 16 | Ht 70.0 in | Wt 218.0 lb

## 2021-10-28 DIAGNOSIS — E118 Type 2 diabetes mellitus with unspecified complications: Secondary | ICD-10-CM

## 2021-10-28 DIAGNOSIS — N1832 Chronic kidney disease, stage 3b: Secondary | ICD-10-CM

## 2021-10-28 DIAGNOSIS — D508 Other iron deficiency anemias: Secondary | ICD-10-CM | POA: Diagnosis not present

## 2021-10-28 DIAGNOSIS — E519 Thiamine deficiency, unspecified: Secondary | ICD-10-CM | POA: Diagnosis not present

## 2021-10-28 DIAGNOSIS — I10 Essential (primary) hypertension: Secondary | ICD-10-CM

## 2021-10-28 DIAGNOSIS — E890 Postprocedural hypothyroidism: Secondary | ICD-10-CM | POA: Diagnosis not present

## 2021-10-28 LAB — URINALYSIS, ROUTINE W REFLEX MICROSCOPIC
Bilirubin Urine: NEGATIVE
Hgb urine dipstick: NEGATIVE
Ketones, ur: NEGATIVE
Leukocytes,Ua: NEGATIVE
Nitrite: NEGATIVE
Specific Gravity, Urine: 1.02 (ref 1.000–1.030)
Total Protein, Urine: NEGATIVE
Urine Glucose: >=1000 — AB
Urobilinogen, UA: 0.2 (ref 0.0–1.0)
pH: 6 (ref 5.0–8.0)

## 2021-10-28 LAB — CBC WITH DIFFERENTIAL/PLATELET
Basophils Absolute: 0.1 10*3/uL (ref 0.0–0.1)
Basophils Relative: 1 % (ref 0.0–3.0)
Eosinophils Absolute: 0.4 10*3/uL (ref 0.0–0.7)
Eosinophils Relative: 4.6 % (ref 0.0–5.0)
HCT: 39.4 % (ref 39.0–52.0)
Hemoglobin: 12.9 g/dL — ABNORMAL LOW (ref 13.0–17.0)
Lymphocytes Relative: 23.3 % (ref 12.0–46.0)
Lymphs Abs: 1.9 10*3/uL (ref 0.7–4.0)
MCHC: 32.7 g/dL (ref 30.0–36.0)
MCV: 91 fl (ref 78.0–100.0)
Monocytes Absolute: 0.8 10*3/uL (ref 0.1–1.0)
Monocytes Relative: 10.5 % (ref 3.0–12.0)
Neutro Abs: 4.9 10*3/uL (ref 1.4–7.7)
Neutrophils Relative %: 60.6 % (ref 43.0–77.0)
Platelets: 250 10*3/uL (ref 150.0–400.0)
RBC: 4.32 Mil/uL (ref 4.22–5.81)
RDW: 14.6 % (ref 11.5–15.5)
WBC: 8 10*3/uL (ref 4.0–10.5)

## 2021-10-28 LAB — MICROALBUMIN / CREATININE URINE RATIO
Creatinine,U: 118.1 mg/dL
Microalb Creat Ratio: 5.8 mg/g (ref 0.0–30.0)
Microalb, Ur: 6.9 mg/dL — ABNORMAL HIGH (ref 0.0–1.9)

## 2021-10-28 LAB — HEMOGLOBIN A1C: Hgb A1c MFr Bld: 6.3 % (ref 4.6–6.5)

## 2021-10-28 LAB — IBC + FERRITIN
Ferritin: 42.8 ng/mL (ref 22.0–322.0)
Iron: 53 ug/dL (ref 42–165)
Saturation Ratios: 12.7 % — ABNORMAL LOW (ref 20.0–50.0)
TIBC: 415.8 ug/dL (ref 250.0–450.0)
Transferrin: 297 mg/dL (ref 212.0–360.0)

## 2021-10-28 LAB — TSH: TSH: 3.05 u[IU]/mL (ref 0.35–5.50)

## 2021-10-28 NOTE — Patient Instructions (Signed)
Anemia Anemia is a condition in which there is not enough red blood cells or hemoglobin in the blood. Hemoglobin is a substance in red blood cells that carries oxygen. When you do not have enough red blood cells or hemoglobin (are anemic), your body cannot get enough oxygen and your organs may not work properly. As a result, you may feel very tired or have other problems. What are the causes? Common causes of anemia include: Excessive bleeding. Anemia can be caused by excessive bleeding inside or outside the body, including bleeding from the intestines or from heavy menstrual periods in females. Poor nutrition. Long-lasting (chronic) kidney, thyroid, and liver disease. Bone marrow disorders, spleen problems, and blood disorders. Cancer and treatments for cancer. HIV (human immunodeficiency virus) and AIDS (acquired immunodeficiency syndrome). Infections, medicines, and autoimmune disorders that destroy red blood cells. What are the signs or symptoms? Symptoms of this condition include: Minor weakness. Dizziness. Headache, or difficulties concentrating and sleeping. Heartbeats that feel irregular or faster than normal (palpitations). Shortness of breath, especially with exercise. Pale skin, lips, and nails, or cold hands and feet. Indigestion and nausea. Symptoms may occur suddenly or develop slowly. If your anemia is mild, you may not have symptoms. How is this diagnosed? This condition is diagnosed based on blood tests, your medical history, and a physical exam. In some cases, a test may be needed in which cells are removed from the soft tissue inside of a bone and looked at under a microscope (bone marrow biopsy). Your health care provider may also check your stool (feces) for blood and may do additional testing to look for the cause of your bleeding. Other tests may include: Imaging tests, such as a CT scan or MRI. A procedure to see inside your esophagus and stomach (endoscopy). A  procedure to see inside your colon and rectum (colonoscopy). How is this treated? Treatment for this condition depends on the cause. If you continue to lose a lot of blood, you may need to be treated at a hospital. Treatment may include: Taking supplements of iron, vitamin B12, or folic acid. Taking a hormone medicine (erythropoietin) that can help to stimulate red blood cell growth. Having a blood transfusion. This may be needed if you lose a lot of blood. Making changes to your diet. Having surgery to remove your spleen. Follow these instructions at home: Take over-the-counter and prescription medicines only as told by your health care provider. Take supplements only as told by your health care provider. Follow any diet instructions that you were given by your health care provider. Keep all follow-up visits as told by your health care provider. This is important. Contact a health care provider if: You develop new bleeding anywhere in the body. Get help right away if: You are very weak. You are short of breath. You have pain in your abdomen or chest. You are dizzy or feel faint. You have trouble concentrating. You have bloody stools, black stools, or tarry stools. You vomit repeatedly or you vomit up blood. These symptoms may represent a serious problem that is an emergency. Do not wait to see if the symptoms will go away. Get medical help right away. Call your local emergency services (911 in the [place] Do not drive yourself to the hospital. Summary Anemia is a condition in which you do not have enough red blood cells or enough of a substance in your red blood cells that carries oxygen (hemoglobin). Symptoms may occur suddenly or develop slowly. If your anemia is   mild, you may not have symptoms. This condition is diagnosed with blood tests, a medical history, and a physical exam. Other tests may be needed. Treatment for this condition depends on the cause of the anemia. This  information is not intended to replace advice given to you by your health care provider. Make sure you discuss any questions you have with your health care provider. Document Revised: 10/22/2019 Document Reviewed: 10/22/2019 Elsevier Patient Education  2022 Elsevier Inc.  

## 2021-10-28 NOTE — Progress Notes (Signed)
Subjective:  Patient ID: Ronald Bond, male    DOB: 05/30/1946  Age: 75 y.o. MRN: [id number]  CC: Hypertension, Hypothyroidism, and Anemia  This visit occurred during the SARS-CoV-2 public health emergency.  Safety protocols were in place, including screening questions prior to the visit, additional usage of staff PPE, and extensive cleaning of exam room while observing appropriate contact time as indicated for disinfecting solutions.    HPI Ronald Bond presents for f/up -  He tells me that he feels well and offers no complaints.  He is active and denies chest pain, shortness of breath, palpitations, or edema.  Outpatient Medications Prior to Visit  Medication Sig Dispense Refill   albuterol (VENTOLIN HFA) 108 (90 Base) MCG/ACT inhaler Inhale 2 puffs into the lungs every 6 (six) hours as needed for wheezing or shortness of breath. 18 g 3   amLODipine (NORVASC) 10 MG tablet TAKE 1 TABLET BY MOUTH EVERY DAY 90 tablet 0   atorvastatin (LIPITOR) 40 MG tablet TAKE 1 TABLET BY MOUTH EVERY DAY 90 tablet 1   cetirizine (ZYRTEC) 5 MG tablet Take 5 mg by mouth daily.     cholecalciferol (VITAMIN D3) 25 MCG (1000 UNIT) tablet Take 1,000 Units by mouth daily.     COVID-19 mRNA bivalent vaccine, Pfizer, injection Inject into the muscle. 0.3 mL 0   dapagliflozin propanediol (FARXIGA) 10 MG TABS tablet Take 1 tablet (10 mg total) by mouth daily before breakfast. 90 tablet 1   docusate sodium (COLACE) 100 MG capsule Take 100 mg by mouth 2 (two) times daily.     fluticasone (FLONASE) 50 MCG/ACT nasal spray Place 2 sprays into both nostrils daily. 48 g 1   indapamide (LOZOL) 1.25 MG tablet TAKE 1 TABLET BY MOUTH DAILY. 90 tablet 0   KLOR-CON M20 20 MEQ tablet TAKE 1 TABLET BY MOUTH TWICE A DAY 180 tablet 0   levothyroxine (SYNTHROID) 112 MCG tablet Take 1 tablet (112 mcg total) by mouth daily. 90 tablet 3   losartan (COZAAR) 100 MG tablet TAKE 1 TABLET BY MOUTH EVERY DAY 90 tablet 0    mometasone-formoterol (DULERA) 100-5 MCG/ACT AERO Inhale 2 puffs into the lungs 2 (two) times daily. 13 g 11   Multiple Vitamin (MULTIVITAMIN) tablet Take 1 tablet by mouth daily.     omeprazole (PRILOSEC) 40 MG capsule Take 1 capsule (40 mg total) by mouth daily. 90 capsule 1   Polyethyl Glycol-Propyl Glycol 0.4-0.3 % SOLN Apply 1 drop to eye as needed.     tamsulosin (FLOMAX) 0.4 MG CAPS capsule TAKE 1 CAPSULE BY MOUTH  DAILY AFTER SUPPER 90 capsule 1   vitamin C (ASCORBIC ACID) 500 MG tablet Take 500 mg by mouth daily.     No facility-administered medications prior to visit.    ROS Review of Systems  Constitutional:  Positive for unexpected weight change (wt gain). Negative for diaphoresis and fatigue.  HENT: Negative.    Eyes: Negative.   Respiratory:  Negative for cough, chest tightness, shortness of breath and wheezing.   Cardiovascular:  Negative for chest pain, palpitations and leg swelling.  Gastrointestinal:  Negative for abdominal pain, constipation, diarrhea, nausea and vomiting.  Endocrine: Negative for cold intolerance and heat intolerance.  Genitourinary: Negative.  Negative for difficulty urinating.  Musculoskeletal: Negative.  Negative for arthralgias and myalgias.  Skin: Negative.   Neurological:  Negative for dizziness, weakness and light-headedness.  Hematological:  Negative for adenopathy. Does not bruise/bleed easily.  Psychiatric/Behavioral: Negative.  Objective:  BP 138/76 (BP Location: Right Arm, Patient Position: Sitting, Cuff Size: Large)   Pulse 66   Temp 98 F (36.7 C) (Oral)   Resp 16   Ht 5\' 10"  (1.778 m)   Wt 218 lb (98.9 kg)   SpO2 98%   BMI 31.28 kg/m   BP Readings from Last 3 Encounters:  10/28/21 138/76  09/17/21 (!) 146/82  09/06/21 (!) 150/70    Wt Readings from Last 3 Encounters:  10/28/21 218 lb (98.9 kg)  09/17/21 214 lb 12.8 oz (97.4 kg)  09/06/21 218 lb (98.9 kg)    Physical Exam Vitals reviewed.  HENT:     Nose: Nose  normal.     Mouth/Throat:     Mouth: Mucous membranes are moist.  Eyes:     General: No scleral icterus.    Conjunctiva/sclera: Conjunctivae normal.  Cardiovascular:     Rate and Rhythm: Normal rate and regular rhythm.     Heart sounds: No murmur heard. Pulmonary:     Effort: Pulmonary effort is normal.     Breath sounds: No stridor. No wheezing, rhonchi or rales.  Abdominal:     General: Abdomen is flat.     Palpations: There is no mass.     Tenderness: There is no abdominal tenderness. There is no guarding.     Hernia: No hernia is present.  Musculoskeletal:        General: Normal range of motion.     Cervical back: Neck supple.     Right lower leg: No edema.     Left lower leg: No edema.  Lymphadenopathy:     Cervical: No cervical adenopathy.  Skin:    General: Skin is warm and dry.     Coloration: Skin is not pale.  Neurological:     General: No focal deficit present.     Mental Status: He is alert.  Psychiatric:        Mood and Affect: Mood normal.    Lab Results  Component Value Date   WBC 8.0 10/28/2021   HGB 12.9 (L) 10/28/2021   HCT 39.4 10/28/2021   PLT 250.0 10/28/2021   GLUCOSE 110 (H) 09/06/2021   CHOL 171 11/30/2020   TRIG 95.0 11/30/2020   HDL 63.70 11/30/2020   LDLDIRECT 75.0 03/08/2016   LDLCALC 89 11/30/2020   ALT 17 11/30/2020   AST 15 11/30/2020   NA 138 09/06/2021   K 4.0 09/06/2021   CL 103 09/06/2021   CREATININE 1.54 (H) 09/06/2021   BUN 21 09/06/2021   CO2 27 09/06/2021   TSH 3.05 10/28/2021   PSA 3.26 11/30/2020   INR 0.9 10/17/2007   HGBA1C 6.3 10/28/2021   MICROALBUR 6.9 (H) 10/28/2021    CT CORONARY FRACTIONAL FLOW RESERVE DATA PREP  Result Date: 06/05/2019 CLINICAL DATA:  Chest pain EXAM: CT FFR MEDICATIONS: No additional medications TECHNIQUE: The coronary CT was sent for FFR. FINDINGS: No modeled stenosis > 30%. IMPRESSION: No evidence for hemodynamically significant coronary disease. Dalton Mclean Electronically Signed    By: Loralie Champagne M.D.   On: 05/28/2019 16:33   CT CORONARY FRACTIONAL FLOW RESERVE FLUID ANALYSIS  Result Date: 06/05/2019 CLINICAL DATA:  Chest pain EXAM: CT FFR MEDICATIONS: No additional medications TECHNIQUE: The coronary CT was sent for FFR. FINDINGS: No modeled stenosis > 30%. IMPRESSION: No evidence for hemodynamically significant coronary disease. Dalton Mclean Electronically Signed   By: Loralie Champagne M.D.   On: 05/28/2019 16:33    Assessment &  Plan:   Ikey was seen today for hypertension, hypothyroidism and anemia.  Diagnoses and all orders for this visit:  Essential hypertension, benign- His BP is well controlled. -     CBC with Differential/Platelet; Future -     TSH; Future -     TSH -     CBC with Differential/Platelet  Postablative hypothyroidism- His TSH is in the normal range. Will stay on the current T4 dose. -     TSH; Future -     TSH  Stage 3b chronic kidney disease ([place])- His renal fxn is stable. -     Microalbumin / creatinine urine ratio; Future -     Urinalysis, Routine w reflex microscopic; Future -     Urinalysis, Routine w reflex microscopic -     Microalbumin / creatinine urine ratio  Iron deficiency anemia secondary to inadequate dietary iron intake- H/H have improved. -     CBC with Differential/Platelet; Future -     IBC + Ferritin; Future -     IBC + Ferritin -     CBC with Differential/Platelet  Type II diabetes mellitus with manifestations ([place])- His blood sugar is well controlled. -     Microalbumin / creatinine urine ratio; Future -     Hemoglobin A1c; Future -     Hemoglobin A1c -     Microalbumin / creatinine urine ratio  Thiamine deficiency- Will monitor his B1 level. -     CBC with Differential/Platelet; Future -     CBC with Differential/Platelet  I am having [name] "Jeanene Erb" maintain his mometasone-formoterol, multivitamin, fluticasone, tamsulosin, levothyroxine, dapagliflozin propanediol, atorvastatin,  Polyethyl Glycol-Propyl Glycol, cetirizine, vitamin C, cholecalciferol, docusate sodium, albuterol, omeprazole, indapamide, Klor-Con M20, losartan, amLODipine, and COVID-19 mRNA bivalent vaccine [employer]).  No orders of the defined types were placed in this encounter.    Follow-up: Return in about 3 months (around 01/26/2022).  Scarlette Calico, MD

## 2021-11-17 ENCOUNTER — Other Ambulatory Visit: Payer: Self-pay | Admitting: Internal Medicine

## 2021-11-17 DIAGNOSIS — I10 Essential (primary) hypertension: Secondary | ICD-10-CM

## 2021-11-17 DIAGNOSIS — E876 Hypokalemia: Secondary | ICD-10-CM

## 2021-11-27 ENCOUNTER — Other Ambulatory Visit: Payer: Self-pay | Admitting: Internal Medicine

## 2021-11-27 DIAGNOSIS — E118 Type 2 diabetes mellitus with unspecified complications: Secondary | ICD-10-CM

## 2021-11-27 DIAGNOSIS — I10 Essential (primary) hypertension: Secondary | ICD-10-CM

## 2021-11-27 DIAGNOSIS — I119 Hypertensive heart disease without heart failure: Secondary | ICD-10-CM

## 2021-12-01 ENCOUNTER — Other Ambulatory Visit: Payer: Self-pay | Admitting: Internal Medicine

## 2021-12-01 DIAGNOSIS — I119 Hypertensive heart disease without heart failure: Secondary | ICD-10-CM

## 2021-12-03 ENCOUNTER — Telehealth: Payer: Self-pay | Admitting: Internal Medicine

## 2021-12-03 NOTE — Telephone Encounter (Signed)
Left message for patient to call back to schedule Medicare Annual Wellness Visit   Last AWV  09/18/20  Please schedule at anytime with [facility] if patient calls the office back.    40 Minutes appointment   Any questions, please call me at [phone]

## 2021-12-13 ENCOUNTER — Other Ambulatory Visit: Payer: Self-pay | Admitting: Internal Medicine

## 2021-12-13 DIAGNOSIS — E118 Type 2 diabetes mellitus with unspecified complications: Secondary | ICD-10-CM

## 2021-12-13 DIAGNOSIS — N1832 Chronic kidney disease, stage 3b: Secondary | ICD-10-CM

## 2021-12-14 ENCOUNTER — Other Ambulatory Visit: Payer: Self-pay | Admitting: Internal Medicine

## 2021-12-14 DIAGNOSIS — I119 Hypertensive heart disease without heart failure: Secondary | ICD-10-CM

## 2021-12-24 ENCOUNTER — Telehealth: Payer: Self-pay

## 2021-12-24 ENCOUNTER — Ambulatory Visit (INDEPENDENT_AMBULATORY_CARE_PROVIDER_SITE_OTHER): Payer: Medicare HMO

## 2021-12-24 ENCOUNTER — Other Ambulatory Visit: Payer: Self-pay

## 2021-12-24 DIAGNOSIS — Z0000 Encounter for general adult medical examination without abnormal findings: Secondary | ICD-10-CM | POA: Diagnosis not present

## 2021-12-24 NOTE — Patient Instructions (Signed)
Ronald Bond , Thank you for taking time to come for your Medicare Wellness Visit. I appreciate your ongoing commitment to your health goals. Please review the following plan we discussed and let me know if I can assist you in the future.   Screening recommendations/referrals: Colonoscopy: last done 03/02/2018 Recommended yearly ophthalmology/optometry visit for glaucoma screening and checkup Recommended yearly dental visit for hygiene and checkup  Vaccinations: Influenza vaccine: 08/18/2021 Pneumococcal vaccine: 08/25/2015, 03/08/2016 Tdap vaccine: 08/25/2015; due every 10 years Shingles vaccine: 08/23/2019   Covid-19: 01/27/2020, 02/25/2020, 08/28/2020, 04/17/2021, 09/06/2021  Advanced directives: Advance directive discussed with you today. Even though you declined this today please call our office should you change your mind and we can give you the proper paperwork for you to fill out.  Conditions/risks identified: Yes; Client understands the importance of follow-up with providers by attending scheduled visits and discussed goals to eat healthier, increase physical activity, exercise the brain, socialize more, get enough sleep and make time for laughter.  Next appointment: 12/26/2022 at 1:40 pm at [facility] 76 Years and Older, Male Preventive care refers to lifestyle choices and visits with your health care provider that can promote health and wellness. What does preventive care include? A yearly physical exam. This is also called an annual well check. Dental exams once or twice a year. Routine eye exams. Ask your health care provider how often you should have your eyes checked. Personal lifestyle choices, including: Daily care of your teeth and gums. Regular physical activity. Eating a healthy diet. Avoiding tobacco and drug use. Limiting alcohol use. Practicing safe sex. Taking low doses of aspirin every day. Taking vitamin and mineral supplements as recommended  by your health care provider. What happens during an annual well check? The services and screenings done by your health care provider during your annual well check will depend on your age, overall health, lifestyle risk factors, and family history of disease. Counseling  Your health care provider may ask you questions about your: Alcohol use. Tobacco use. Drug use. Emotional well-being. Home and relationship well-being. Sexual activity. Eating habits. History of falls. Memory and ability to understand (cognition). Work and work Statistician. Screening  You may have the following tests or measurements: Height, weight, and BMI. Blood pressure. Lipid and cholesterol levels. These may be checked every 5 years, or more frequently if you are over 10 years old. Skin check. Lung cancer screening. You may have this screening every year starting at age 76 if you have a 30-pack-year history of smoking and currently smoke or have quit within the past 15 years. Fecal occult blood test (FOBT) of the stool. You may have this test every year starting at age 76. Flexible sigmoidoscopy or colonoscopy. You may have a sigmoidoscopy every 5 years or a colonoscopy every 10 years starting at age 76. Prostate cancer screening. Recommendations will vary depending on your family history and other risks. Hepatitis C blood test. Hepatitis B blood test. Sexually transmitted disease (STD) testing. Diabetes screening. This is done by checking your blood sugar (glucose) after you have not eaten for a while (fasting). You may have this done every 1-3 years. Abdominal aortic aneurysm (AAA) screening. You may need this if you are a current or former smoker. Osteoporosis. You may be screened starting at age 76 if you are at high risk. Talk with your health care provider about your test results, treatment options, and if necessary, the need for more tests. Vaccines  Your health care  provider may recommend certain  vaccines, such as: Influenza vaccine. This is recommended every year. Tetanus, diphtheria, and acellular pertussis (Tdap, Td) vaccine. You may need a Td booster every 10 years. Zoster vaccine. You may need this after age 76. Pneumococcal 13-valent conjugate (PCV13) vaccine. One dose is recommended after age 76. Pneumococcal polysaccharide (PPSV23) vaccine. One dose is recommended after age 76. Talk to your health care provider about which screenings and vaccines you need and how often you need them. This information is not intended to replace advice given to you by your health care provider. Make sure you discuss any questions you have with your health care provider. Document Released: 12/11/2015 Document Revised: 08/03/2016 Document Reviewed: 09/15/2015 Elsevier Interactive Patient Education  2017 [place] Prevention in the Home Falls can cause injuries. They can happen to people of all ages. There are many things you can do to make your home safe and to help prevent falls. What can I do on the outside of my home? Regularly fix the edges of walkways and driveways and fix any cracks. Remove anything that might make you trip as you walk through a door, such as a raised step or threshold. Trim any bushes or trees on the path to your home. Use bright outdoor lighting. Clear any walking paths of anything that might make someone trip, such as rocks or tools. Regularly check to see if handrails are loose or broken. Make sure that both sides of any steps have handrails. Any raised decks and porches should have guardrails on the edges. Have any leaves, snow, or ice cleared regularly. Use sand or salt on walking paths during winter. Clean up any spills in your garage right away. This includes oil or grease spills. What can I do in the bathroom? Use night lights. Install grab bars by the toilet and in the tub and shower. Do not use towel bars as grab bars. Use non-skid mats or decals in  the tub or shower. If you need to sit down in the shower, use a plastic, non-slip stool. Keep the floor dry. Clean up any water that spills on the floor as soon as it happens. Remove soap buildup in the tub or shower regularly. Attach bath mats securely with double-sided non-slip rug tape. Do not have throw rugs and other things on the floor that can make you trip. What can I do in the bedroom? Use night lights. Make sure that you have a light by your bed that is easy to reach. Do not use any sheets or blankets that are too big for your bed. They should not hang down onto the floor. Have a firm chair that has side arms. You can use this for support while you get dressed. Do not have throw rugs and other things on the floor that can make you trip. What can I do in the kitchen? Clean up any spills right away. Avoid walking on wet floors. Keep items that you use a lot in easy-to-reach places. If you need to reach something above you, use a strong step stool that has a grab bar. Keep electrical cords out of the way. Do not use floor polish or wax that makes floors slippery. If you must use wax, use non-skid floor wax. Do not have throw rugs and other things on the floor that can make you trip. What can I do with my stairs? Do not leave any items on the stairs. Make sure that there are handrails on both  sides of the stairs and use them. Fix handrails that are broken or loose. Make sure that handrails are as long as the stairways. Check any carpeting to make sure that it is firmly attached to the stairs. Fix any carpet that is loose or worn. Avoid having throw rugs at the top or bottom of the stairs. If you do have throw rugs, attach them to the floor with carpet tape. Make sure that you have a light switch at the top of the stairs and the bottom of the stairs. If you do not have them, ask someone to add them for you. What else can I do to help prevent falls? Wear shoes that: Do not have high  heels. Have rubber bottoms. Are comfortable and fit you well. Are closed at the toe. Do not wear sandals. If you use a stepladder: Make sure that it is fully opened. Do not climb a closed stepladder. Make sure that both sides of the stepladder are locked into place. Ask someone to hold it for you, if possible. Clearly mark and make sure that you can see: Any grab bars or handrails. First and last steps. Where the edge of each step is. Use tools that help you move around (mobility aids) if they are needed. These include: Canes. Walkers. Scooters. Crutches. Turn on the lights when you go into a dark area. Replace any light bulbs as soon as they burn out. Set up your furniture so you have a clear path. Avoid moving your furniture around. If any of your floors are uneven, fix them. If there are any pets around you, be aware of where they are. Review your medicines with your doctor. Some medicines can make you feel dizzy. This can increase your chance of falling. Ask your doctor what other things that you can do to help prevent falls. This information is not intended to replace advice given to you by your health care provider. Make sure you discuss any questions you have with your health care provider. Document Released: 09/10/2009 Document Revised: 04/21/2016 Document Reviewed: 12/19/2014 Elsevier Interactive Patient Education  2017 [employer].

## 2021-12-24 NOTE — Telephone Encounter (Signed)
Requesting a refill on Flomax to be sent to [facility].

## 2021-12-24 NOTE — Progress Notes (Signed)
Subjective:   Ronald Bond is a 76 y.o. male who presents for Medicare Annual/Subsequent preventive examination.  Review of Systems     Cardiac Risk Factors include: advanced age (>75men, >63 women);diabetes mellitus;dyslipidemia;family history of premature cardiovascular disease;hypertension;male gender     Objective:    There were no vitals filed for this visit. There is no height or weight on file to calculate BMI.  Advanced Directives 12/24/2021 09/18/2020 12/07/2018 11/30/2017 11/29/2016 11/18/2013 11/12/2013  Does Patient Have a Medical Advance Directive? No No No No No - Patient does not have advance directive;Patient would like information  Would patient like information on creating a medical advance directive? Yes (MAU/Ambulatory/Procedural Areas - Information given) Yes (MAU/Ambulatory/Procedural Areas - Information given) Yes (ED - Information included in AVS) Yes (ED - Information included in AVS) Yes (MAU/Ambulatory/Procedural Areas - Information given) - -  Pre-existing out of facility DNR order (yellow form or pink MOST form) - - - - - No -    Current Medications (verified) Outpatient Encounter Medications as of 12/24/2021  Medication Sig   albuterol (VENTOLIN HFA) 108 (90 Base) MCG/ACT inhaler Inhale 2 puffs into the lungs every 6 (six) hours as needed for wheezing or shortness of breath.   amLODipine (NORVASC) 10 MG tablet TAKE 1 TABLET BY MOUTH EVERY DAY   atorvastatin (LIPITOR) 40 MG tablet TAKE 1 TABLET BY MOUTH EVERY DAY   cetirizine (ZYRTEC) 5 MG tablet Take 5 mg by mouth daily.   cholecalciferol (VITAMIN D3) 25 MCG (1000 UNIT) tablet Take 1,000 Units by mouth daily.   COVID-19 mRNA bivalent vaccine, Pfizer, injection Inject into the muscle.   docusate sodium (COLACE) 100 MG capsule Take 100 mg by mouth 2 (two) times daily.   FARXIGA 10 MG TABS tablet TAKE 1 TABLET BY MOUTH DAILY BEFORE BREAKFAST.   fluticasone (FLONASE) 50 MCG/ACT nasal spray Place 2 sprays into  both nostrils daily.   indapamide (LOZOL) 1.25 MG tablet TAKE 1 TABLET BY MOUTH EVERY DAY   KLOR-CON M20 20 MEQ tablet TAKE 1 TABLET BY MOUTH TWICE A DAY   levothyroxine (SYNTHROID) 112 MCG tablet Take 1 tablet (112 mcg total) by mouth daily.   losartan (COZAAR) 100 MG tablet TAKE 1 TABLET BY MOUTH EVERY DAY   mometasone-formoterol (DULERA) 100-5 MCG/ACT AERO Inhale 2 puffs into the lungs 2 (two) times daily.   Multiple Vitamin (MULTIVITAMIN) tablet Take 1 tablet by mouth daily.   omeprazole (PRILOSEC) 40 MG capsule Take 1 capsule (40 mg total) by mouth daily.   Polyethyl Glycol-Propyl Glycol 0.4-0.3 % SOLN Apply 1 drop to eye as needed.   tamsulosin (FLOMAX) 0.4 MG CAPS capsule TAKE 1 CAPSULE BY MOUTH  DAILY AFTER SUPPER   vitamin C (ASCORBIC ACID) 500 MG tablet Take 500 mg by mouth daily.   No facility-administered encounter medications on file as of 12/24/2021.    Allergies (verified) Lisinopril   History: Past Medical History:  Diagnosis Date   GERD (gastroesophageal reflux disease)    HTN (hypertension)    Hyperlipidemia    Hypothyroidism    Osteoarthritis    Wears glasses    Past Surgical History:  Procedure Laterality Date   COLONOSCOPY     EYE SURGERY     both cataracts   MICROLARYNGOSCOPY Left 11/18/2013   Procedure: MICROLARYNGOSCOPY WITH REMOVAL OF GRANULOMA LEFT SIDE;  Surgeon: Izora Gala, MD;  Location: [facility];  Service: ENT;  Laterality: Left;   NM MYOVIEW LTD  06/2007   Low  risk, normal.  No evidence of ischemia or infarction   THYROIDECTOMY  1974   TONSILLECTOMY     TOTAL KNEE ARTHROPLASTY  2009   left   Family History  Problem Relation Age of Onset   Brain cancer Mother    Heart disease Father    Heart disease Brother    Social History   Socioeconomic History   Marital status: Married    Spouse name: Not on file   Number of children: 2   Years of education: Not on file   Highest education level: Not on file  Occupational  History   Occupation: railroad  Tobacco Use   Smoking status: Never   Smokeless tobacco: Never  Vaping Use   Vaping Use: Never used  Substance and Sexual Activity   Alcohol use: Yes    Comment: occassionally   Drug use: No   Sexual activity: Yes  Other Topics Concern   Not on file  Social History Narrative   Regular Exercise -  YES         Social Determinants of Health   Financial Resource Strain: Low Risk    Difficulty of Paying Living Expenses: Not hard at all  Food Insecurity: No Food Insecurity   Worried About Charity fundraiser in the Last Year: Never true   [place] in the Last Year: Never true  Transportation Needs: No Transportation Needs   Lack of Transportation (Medical): No   Lack of Transportation (Non-Medical): No  Physical Activity: Sufficiently Active   Days of Exercise per Week: 7 days   Minutes of Exercise per Session: 30 min  Stress: No Stress Concern Present   Feeling of Stress : Not at all  Social Connections: Socially Integrated   Frequency of Communication with Friends and Family: More than three times a week   Frequency of Social Gatherings with Friends and Family: Not on file   Attends Religious Services: More than 4 times per year   Active Member of [employer] or Organizations: Yes   Attends Music therapist: More than 4 times per year   Marital Status: Married    Tobacco Counseling Counseling given: Not Answered   Clinical Intake:  Pre-visit preparation completed: Yes  Pain : No/denies pain     Nutritional Risks: None Diabetes: Yes CBG done?: No Did pt. bring in CBG monitor from home?: No  How often do you need to have someone help you when you read instructions, pamphlets, or other written materials from your doctor or pharmacy?: 1 - Never What is the last grade level you completed in school?: HSG  Diabetic? yes  Interpreter Needed?: No  Information entered by :: Lisette Abu, LPN   Activities of  Daily Living In your present state of health, do you have any difficulty performing the following activities: 12/24/2021 10/28/2021  Hearing? Y N  Vision? N N  Difficulty concentrating or making decisions? N N  Walking or climbing stairs? N N  Dressing or bathing? N N  Doing errands, shopping? N N  Preparing Food and eating ? N -  Using the Toilet? N -  In the past six months, have you accidently leaked urine? N -  Do you have problems with loss of bowel control? N -  Managing your Medications? N -  Managing your Finances? N -  Housekeeping or managing your Housekeeping? N -  Some recent data might be hidden    Patient Care Team: Janith Lima, MD  as PCP - General Leonie Man, MD as PCP - Cardiology (Cardiology) Leonie Man, MD as Consulting Physician (Cardiology) Franchot Gallo, MD as Consulting Physician (Urology) Deneise Lever, MD as Consulting Physician (Pulmonary Disease) [facility] (Dentistry) Delice Bison Darnelle Maffucci, [facility] as Pharmacist (Pharmacist) Glory Rosebush, MD as Consulting Physician ([facility])  Indicate any recent Medical Services you may have received from other than [facility] providers in the past year (date may be approximate).     Assessment:   This is a routine wellness examination for Nymir.  Hearing/Vision screen Hearing Screening - Comments:: Patient has hearing difficulty and has hearing aids, but does not wear them. Vision Screening - Comments:: Patient wears corrective glasses/contacts.  Eye exam done every 6 months by: [facility] [place], [place]).  Dietary issues and exercise activities discussed: Current Exercise Habits: The patient has a physically strenuous job, but has no regular exercise apart from work., Exercise limited by: respiratory conditions(s);orthopedic condition(s);neurologic condition(s);cardiac condition(s)   Goals Addressed               This Visit's Progress     Patient Stated (pt-stated)         To stay healthy.      Depression Screen PHQ 2/9 Scores 12/24/2021 06/21/2021 09/18/2020 05/12/2020 12/07/2018 10/15/2018 04/30/2018  PHQ - 2 Score 0 0 0 0 0 0 0    Fall Risk Fall Risk  12/24/2021 06/21/2021 09/18/2020 05/12/2020 12/07/2018  Falls in the past year? 0 0 0 0 0  Number falls in past yr: 0 0 0 0 -  Injury with Fall? 0 0 0 0 -  Risk for fall due to : No Fall Risks - No Fall Risks No Fall Risks -  Follow up Falls evaluation completed - Falls evaluation completed;Education provided Falls evaluation completed -    FALL RISK PREVENTION PERTAINING TO THE HOME:  Any stairs in or around the home? Yes  If so, are there any without handrails? No  Home free of loose throw rugs in walkways, pet beds, electrical cords, etc? Yes  Adequate lighting in your home to reduce risk of falls? Yes   ASSISTIVE DEVICES UTILIZED TO PREVENT FALLS:  Life alert? No  Use of a cane, walker or w/c? No  Grab bars in the bathroom? No  Shower chair or bench in shower? No  Elevated toilet seat or a handicapped toilet? Yes   TIMED UP AND GO:  Was the test performed? Yes .  Length of time to ambulate 10 feet: 6 sec.   Gait steady and fast without use of assistive device  Cognitive Function: Normal cognitive status assessed by direct observation by this Nurse Health Advisor. No abnormalities found.   MMSE - Mini Mental State Exam 11/30/2017  Orientation to time 5  Orientation to Place 5  Registration 3  Attention/ Calculation 5  Recall 1  Language- name 2 objects 2  Language- repeat 1  Language- follow 3 step command 3  Language- read & follow direction 1  Write a sentence 1  Copy design 1  Total score 28     6CIT Screen 09/18/2020  What Year? 0 points  What month? 0 points  What time? 0 points  Count back from 20 0 points  Months in reverse 0 points  Repeat phrase 0 points  Total Score 0    Immunizations Immunization History  Administered Date(s) Administered   Fluad Quad(high Dose  65+) 07/27/2019   Influenza Split 10/24/2011   Influenza Whole  08/19/2009, 07/23/2010   Influenza, High Dose Seasonal PF 10/21/2013, 09/27/2016, 09/07/2017, 09/14/2018   Influenza,inj,Quad PF,6+ Mos 08/07/2014, 08/25/2015   Influenza-Unspecified 08/28/2020, 08/18/2021   PFIZER Comirnaty(Gray Top)Covid-19 Tri-Sucrose Vaccine 04/17/2021   PFIZER(Purple Top)SARS-COV-2 Vaccination 01/27/2020, 02/25/2020, 08/28/2020   Pfizer Covid-19 Vaccine Bivalent Booster 77yrs & up 09/06/2021   Pneumococcal Conjugate-13 08/25/2015   Pneumococcal Polysaccharide-23 11/28/2004, 03/08/2016   Td 11/28/2004   Tdap 08/25/2015   Zoster Recombinat (Shingrix) 08/23/2019   Zoster, Live 02/24/2011    TDAP status: Up to date  Flu Vaccine status: Up to date  Pneumococcal vaccine status: Up to date  Covid-19 vaccine status: Completed vaccines  Qualifies for Shingles Vaccine? Yes   Zostavax completed Yes   Shingrix Completed?: No.    Education has been provided regarding the importance of this vaccine. Patient has been advised to call insurance company to determine out of pocket expense if they have not yet received this vaccine. Advised may also receive vaccine at local pharmacy or Health Dept. Verbalized acceptance and understanding.  Screening Tests Health Maintenance  Topic Date Due   Zoster Vaccines- Shingrix (2 of 2) 10/18/2019   FOOT EXAM  11/30/2021   HEMOGLOBIN A1C  04/28/2022   OPHTHALMOLOGY EXAM  10/07/2022   TETANUS/TDAP  08/24/2025   Pneumonia Vaccine 51+ Years old  Completed   INFLUENZA VACCINE  Completed   COVID-19 Vaccine  Completed   Hepatitis C Screening  Completed   HPV VACCINES  Aged Out   Fecal DNA (Cologuard)  Discontinued    Health Maintenance  Health Maintenance Due  Topic Date Due   Zoster Vaccines- Shingrix (2 of 2) 10/18/2019   FOOT EXAM  11/30/2021    Colorectal cancer screening: Type of screening: Colonoscopy. Completed 03/02/2018. Repeat every 10 years  Lung Cancer  Screening: (Low Dose CT Chest recommended if Age 46-80 years, 30 pack-year currently smoking OR have quit w/in 15years.) does not qualify.   Lung Cancer Screening Referral: no  Additional Screening:  Hepatitis C Screening: does qualify; Completed: no  Vision Screening: Recommended annual ophthalmology exams for early detection of glaucoma and other disorders of the eye. Is the patient up to date with their annual eye exam?  Yes  Who is the provider or what is the name of the office in which the patient attends annual eye exams? [facility] [place], [place]) If pt is not established with a provider, would they like to be referred to a provider to establish care? No .   Dental Screening: Recommended annual dental exams for proper oral hygiene  Community Resource Referral / Chronic Care Management: CRR required this visit?  No   CCM required this visit?  No      Plan:     I have personally reviewed and noted the following in the patients chart:   Medical and social history Use of alcohol, tobacco or illicit drugs  Current medications and supplements including opioid prescriptions. Patient is not currently taking opioid prescriptions. Functional ability and status Nutritional status Physical activity Advanced directives List of other physicians Hospitalizations, surgeries, and ER visits in previous 12 months Vitals Screenings to include cognitive, depression, and falls Referrals and appointments  In addition, I have reviewed and discussed with patient certain preventive protocols, quality metrics, and best practice recommendations. A written personalized care plan for preventive services as well as general preventive health recommendations were provided to patient.     Sheral Flow, LPN   6/80/3212   Nurse Notes:  Hearing Screening -  Comments:: Patient has hearing difficulty and has hearing aids, but does not wear them. Vision Screening - Comments:: Patient  wears corrective glasses/contacts.  Eye exam done every 6 months by: [facility] [place], [place]).

## 2021-12-25 ENCOUNTER — Other Ambulatory Visit: Payer: Self-pay | Admitting: Internal Medicine

## 2021-12-25 DIAGNOSIS — N410 Acute prostatitis: Secondary | ICD-10-CM

## 2021-12-25 DIAGNOSIS — N401 Enlarged prostate with lower urinary tract symptoms: Secondary | ICD-10-CM

## 2021-12-25 MED ORDER — TAMSULOSIN HCL 0.4 MG PO CAPS: 0.4000 mg | ORAL_CAPSULE | Freq: Every day | ORAL | 1 refills | Status: DC

## 2021-12-27 ENCOUNTER — Other Ambulatory Visit: Payer: Self-pay

## 2021-12-27 ENCOUNTER — Encounter: Payer: Self-pay | Admitting: Cardiology

## 2021-12-27 ENCOUNTER — Other Ambulatory Visit: Payer: Self-pay | Admitting: Internal Medicine

## 2021-12-27 ENCOUNTER — Ambulatory Visit: Payer: Medicare HMO | Admitting: Cardiology

## 2021-12-27 VITALS — BP 150/70 | HR 57 | Ht 70.0 in | Wt 215.0 lb

## 2021-12-27 DIAGNOSIS — E785 Hyperlipidemia, unspecified: Secondary | ICD-10-CM | POA: Diagnosis not present

## 2021-12-27 DIAGNOSIS — E669 Obesity, unspecified: Secondary | ICD-10-CM | POA: Diagnosis not present

## 2021-12-27 DIAGNOSIS — K2100 Gastro-esophageal reflux disease with esophagitis, without bleeding: Secondary | ICD-10-CM

## 2021-12-27 DIAGNOSIS — G4733 Obstructive sleep apnea (adult) (pediatric): Secondary | ICD-10-CM | POA: Diagnosis not present

## 2021-12-27 DIAGNOSIS — I491 Atrial premature depolarization: Secondary | ICD-10-CM | POA: Insufficient documentation

## 2021-12-27 DIAGNOSIS — I119 Hypertensive heart disease without heart failure: Secondary | ICD-10-CM | POA: Diagnosis not present

## 2021-12-27 DIAGNOSIS — E1169 Type 2 diabetes mellitus with other specified complication: Secondary | ICD-10-CM

## 2021-12-27 DIAGNOSIS — R001 Bradycardia, unspecified: Secondary | ICD-10-CM

## 2021-12-27 MED ORDER — VALSARTAN 320 MG PO TABS: 320.0000 mg | ORAL_TABLET | Freq: Every day | ORAL | 2 refills | Status: DC

## 2021-12-27 MED ORDER — BLOOD PRESSURE MONITOR/ARM DEVI: 1.0000 | Freq: Every day | 0 refills | Status: AC

## 2021-12-27 NOTE — Patient Instructions (Signed)
Medication Instructions:    STOP TAKING LOSARTAN    START TAKING VALSARTAN 320 MG - THE FIRST 4 DAYS TAKE  1/2 TABLET OF 320 MG THEN INCREASE TO  WHOLE TABLET .   *If you need a refill on your cardiac medications before your next appointment, please call your pharmacy*   Other Instructions   START  MONITORING YOUR BLOOD PRESSURE - THE RANGE SHOULD BE LESS THAN 135/80 MOST OF THE TIME .    Lab Work: NOT NEEDED    Testing/Procedures:  NOT NEEDED  Follow-Up: At [facility], you and your health needs are our priority.  As part of our continuing mission to provide you with exceptional heart care, we have created designated Provider Care Teams.  These Care Teams include your primary Cardiologist (physician) and Advanced Practice Providers (APPs -  Physician Assistants and Nurse Practitioners) who all work together to provide you with the care you need, when you need it.     Your next appointment:   6 month(s)  The format for your next appointment:   In Person  Provider:   Glenetta Hew, MD    Other Instructions   START  MONITORING YOUR BLOOD PRESSURE - [place] 135/80 MOST OF THE TIME .

## 2021-12-27 NOTE — Assessment & Plan Note (Signed)
4.9% PACs noted on monitor.  Relatively asymptomatic.  Also noted short bursts of atrial runs.  Also asymptomatic.  With concerns of bradycardia and exertional dyspnea related to bradycardia, will hold off beta-blockers.

## 2021-12-27 NOTE — Progress Notes (Signed)
Primary Care Provider: Janith Lima, MD Cardiologist: Glenetta Hew, MD Electrophysiologist: None  Clinic Note: Chief Complaint  Patient presents with   Follow-up    3 months.   Hypertension    Still not adequately controlled   ===================================  ASSESSMENT/PLAN   Problem List Items Addressed This Visit       Cardiology Problems   Hyperlipidemia associated with type 2 diabetes mellitus ([place]) (Chronic)    Should be due for labs checked soon.  On 40 mg atorvastatin along with [place].  Otherwise not really on much in the way of any glycemic management.      Relevant Medications   valsartan (DIOVAN) 320 MG tablet   Hypertensive heart disease without CHF (congestive heart failure) - Primary (Chronic)    Remains hypertensive today.  Did not tolerate beta-blockers apparently because of bradycardia and fatigue.  This is somewhat unfortunate because of his PACs, but still unless we can use Bystolic, we are limited.  Thankfully, he seems relatively euvolemic with the use of indapamide and [place].  Seems to be NYHA class II symptoms.  Plan: Convert from losartan to valsartan 320 mg Continue amlodipine. BP log at home (will need a new cuff) recommend checking it intermittently morning and night depending on different days.  Both before and after taking about medications. Continue indapamide and Farxiga I have.  Also on potassium supplementation -> needs intermittent renal function evaluation.  Creatinine was 1.54 as of October 2022      Relevant Medications   valsartan (DIOVAN) 320 MG tablet   PAC (premature atrial contraction) (Chronic)    4.9% PACs noted on monitor.  Relatively asymptomatic.  Also noted short bursts of atrial runs.  Also asymptomatic.  With concerns of bradycardia and exertional dyspnea related to bradycardia, will hold off beta-blockers.      Relevant Medications   valsartan (DIOVAN) 320 MG tablet     Other   Obstructive sleep  apnea    Does not routinely use CPAP.      Obesity (BMI 30.0-34.9)    Discussed increased exercise and dietary modification.  This will help with weight loss but also with blood pressure and lipid control.      Bradycardia    No longer bradycardic since he is no longer on beta-blocker.  Unfortunately this takes away 1 declines medication for blood pressure management.  Heart rate l remains relatively low off of beta-blocker, but there are more notable more pronounced PVCs noted on follow-up.  Avoid beta-blocker/calcium channel blocker AV nodal agents..      ===================================  HPI:    Ronald Bond is a 76 y.o. male with a PMH notable for HTN/Hypertensive Heart Disease (without CHF), OSA (not on regular CPAP) below who presents today for 58-month follow-up.  LLEWELYN SHEAFFER was last seen on September 17, 2021 by Jory Sims, NP is working visit for evaluation of chest pain.  He is advised to go to the ER on October 14 for chest pain but did not go.  We had recently increased his carvedilol dose to 6.25 mg twice daily in conjunction with amlodipine and losartan with diabetes mellitus plus potassium.  His PCP discontinued carvedilol due to bradycardia. => He was noted to be in sinus bradycardia despite having stopped bradycardia.  7-day Zio patch ordered.  He was able to climb stairs and walk around the [facility] station without difficulty-shortness of breath and dizziness.  Setting angina.  BP controlled.  Recent Hospitalizations:  None  Reviewed  CV studies:    The following studies were reviewed today: (if available, images/films reviewed: From Epic Chart or Care Everywhere) 7-day Zio patch September 17, 2021: Predominantly sinus rhythm.:Heart rate range 36-137 bpm.  Average 66 bpm.   28 episodes of PAT/atrial runs: Fastest 6 beats-102 bpm, longest 13.1 seconds 126 beats. Occasional PACs (4.9%) with rare <1%) couplets and triplets.  Rare isolated PVCs <1%) No  sustained arrhythmias-tachycardic or bradycardic)   Interval History:   ZABDIEL DRIPPS returns for 36-month follow-up to discuss results of his monitor.  He says he feels much better since stopping the beta-blocker.  His energy level is definitely improved.  He is very active at his job, and maybe gets a little short of breath if he is going up steps quite a bit, but for the most part does not necessarily notice any exertional dyspnea.  No chest pain or pressure.  He really does not feel that much in the way of any significant irregular heartbeats or palpitations.  No lightheadedness or dizziness.  He describes occasional episodes of a squeezing point pressure-like area on the right pack muscle.  Usually made worse with certain movements.  This is the same discomfort has been having off-and-on for quite a while now. He says that his blood pressure cuff at home has not been working, history is not only been out of figure out what his pressures have been at home.  CV Review of Symptoms (Summary) Cardiovascular ROS: positive for - chest pain, dyspnea on exertion, palpitations, and all mild.  Chest pain is that fleeting sharp pressure-like pain mostly on the right side of chest.  Exertional dyspnea with significant exertion going up and down stairs.  Palpitations relatively well controlled.  No significant symptoms. negative for - edema, orthopnea, paroxysmal nocturnal dyspnea, rapid heart rate, shortness of breath, or syncope/near syncope or TIA/MR suggestive of claudication  REVIEWED OF SYSTEMS   Review of Systems  Constitutional:  Negative for malaise/fatigue and weight loss.  HENT:  Negative for congestion and nosebleeds.   Respiratory:  Negative for cough and shortness of breath.   Cardiovascular:        Per HPI  Gastrointestinal:  Negative for blood in stool and melena.  Genitourinary:  Negative for hematuria.  Musculoskeletal:  Positive for joint pain (Mild arthritis pains). Negative  for back pain and falls.  Neurological:  Negative for dizziness and focal weakness.  Psychiatric/Behavioral: Negative.     I have reviewed and (if needed) personally updated the patient's problem list, medications, allergies, past medical and surgical history, social and family history.   PAST MEDICAL HISTORY   Past Medical History:  Diagnosis Date   GERD (gastroesophageal reflux disease)    HTN (hypertension)    Hyperlipidemia    Hypothyroidism    Osteoarthritis    Wears glasses     PAST SURGICAL HISTORY   Past Surgical History:  Procedure Laterality Date   COLONOSCOPY     EYE SURGERY     both cataracts   MICROLARYNGOSCOPY Left 11/18/2013   Procedure: MICROLARYNGOSCOPY WITH REMOVAL OF GRANULOMA LEFT SIDE;  Surgeon: Izora Gala, MD;  Location: [facility];  Service: ENT;  Laterality: Left;   NM MYOVIEW LTD  06/2007   Low risk, normal.  No evidence of ischemia or infarction   THYROIDECTOMY  1974   TONSILLECTOMY     TOTAL KNEE ARTHROPLASTY  2009   left    Immunization History  Administered Date(s) Administered  Fluad Quad(high Dose 65+) 07/27/2019   Influenza Split 10/24/2011   Influenza Whole 08/19/2009, 07/23/2010   Influenza, High Dose Seasonal PF 10/21/2013, 09/27/2016, 09/07/2017, 09/14/2018   Influenza,inj,Quad PF,6+ Mos 08/07/2014, 08/25/2015   Influenza-Unspecified 08/28/2020, 08/18/2021   PFIZER Comirnaty(Gray Top)Covid-19 Tri-Sucrose Vaccine 04/17/2021   PFIZER(Purple Top)SARS-COV-2 Vaccination 01/27/2020, 02/25/2020, 08/28/2020   Pfizer Covid-19 Vaccine Bivalent Booster 75yrs & up 09/06/2021   Pneumococcal Conjugate-13 08/25/2015   Pneumococcal Polysaccharide-23 11/28/2004, 03/08/2016   Td 11/28/2004   Tdap 08/25/2015   Zoster Recombinat (Shingrix) 08/23/2019   Zoster, Live 02/24/2011    MEDICATIONS/ALLERGIES   Current Meds  Medication Sig   albuterol (VENTOLIN HFA) 108 (90 Base) MCG/ACT inhaler Inhale 2 puffs into the lungs every 6  (six) hours as needed for wheezing or shortness of breath.   amLODipine (NORVASC) 10 MG tablet TAKE 1 TABLET BY MOUTH EVERY DAY   atorvastatin (LIPITOR) 40 MG tablet TAKE 1 TABLET BY MOUTH EVERY DAY   Blood Pressure Monitoring (BLOOD PRESSURE MONITOR/ARM) DEVI 1 Device by Does not apply route daily. USE DAILY TO  TAKE BLOOD PRESSURE READINGS   cetirizine (ZYRTEC) 5 MG tablet Take 5 mg by mouth daily.   cholecalciferol (VITAMIN D3) 25 MCG (1000 UNIT) tablet Take 1,000 Units by mouth daily.   COVID-19 mRNA bivalent vaccine, Pfizer, injection Inject into the muscle.   docusate sodium (COLACE) 100 MG capsule Take 100 mg by mouth 2 (two) times daily.   FARXIGA 10 MG TABS tablet TAKE 1 TABLET BY MOUTH DAILY BEFORE BREAKFAST.   fluticasone (FLONASE) 50 MCG/ACT nasal spray Place 2 sprays into both nostrils daily.   indapamide (LOZOL) 1.25 MG tablet TAKE 1 TABLET BY MOUTH EVERY DAY   KLOR-CON M20 20 MEQ tablet TAKE 1 TABLET BY MOUTH TWICE A DAY   levothyroxine (SYNTHROID) 112 MCG tablet Take 1 tablet (112 mcg total) by mouth daily.   mometasone-formoterol (DULERA) 100-5 MCG/ACT AERO Inhale 2 puffs into the lungs 2 (two) times daily.   Multiple Vitamin (MULTIVITAMIN) tablet Take 1 tablet by mouth daily.   omeprazole (PRILOSEC) 40 MG capsule TAKE 1 CAPSULE (40 MG TOTAL) BY MOUTH DAILY.   Polyethyl Glycol-Propyl Glycol 0.4-0.3 % SOLN Apply 1 drop to eye as needed.   tamsulosin (FLOMAX) 0.4 MG CAPS capsule Take 1 capsule (0.4 mg total) by mouth daily after supper.   valsartan (DIOVAN) 320 MG tablet Take 1 tablet (320 mg total) by mouth daily.   vitamin C (ASCORBIC ACID) 500 MG tablet Take 500 mg by mouth daily.   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  He states that he enjoys  the job a lot does a lot of walking and climbing stairs without  significant discomfort.      He usually works ~2 weeks @ a time - stays in [employer] (paid for by [employer]) - & comes home for ~4 d weekends.    OBJCTIVE -PE, EKG, labs   Wt Readings from Last 3 Encounters:  12/27/21 215 lb (97.5 kg)  10/28/21 218 lb (98.9 kg)  09/17/21 214 lb 12.8 oz (97.4 kg)    Physical Exam: BP (!) 150/70 (BP Location: Left Arm, Patient Position: Sitting, Cuff Size: Normal)    Pulse (!) 57    Ht 5\' 10"  (1.778 m)    Wt 215 lb (97.5 kg)    BMI 30.85 kg/m  Physical Exam Vitals reviewed.  Constitutional:      Appearance: Normal appearance. He is obese.  HENT:     Head: Normocephalic and atraumatic.  Neck:     Vascular: No carotid bruit or JVD.  Cardiovascular:     Rate and Rhythm: Regular rhythm. Bradycardia present. No extrasystoles are present.    Chest Wall: PMI is not displaced.     Pulses: Intact distal pulses. Decreased pulses (Diminished, but palpable pulses.).     Heart sounds: S1 normal and S2 normal. Heart sounds are distant. No murmur heard.   No friction rub. No gallop.  Pulmonary:     Effort: Pulmonary effort is normal. No respiratory distress.     Breath sounds: Normal breath sounds. No wheezing, rhonchi or rales.  Chest:     Chest wall: No tenderness.  Musculoskeletal:        General: Swelling (Trivial ankle) present.     Cervical back: Normal range of motion and neck supple.  Skin:    General: Skin is warm and dry.  Neurological:      General: No focal deficit present.     Mental Status: He is alert and oriented to person, place, and time. Mental status is at baseline.  Psychiatric:        Mood and Affect: Mood normal.        Behavior: Behavior normal.        Thought Content: Thought content normal.        Judgment: Judgment normal.     Adult ECG Report Not checked  Recent Labs: Reviewed Lab Results  Component Value Date   CHOL 171 11/30/2020   HDL 63.70 11/30/2020   LDLCALC 89 11/30/2020   LDLDIRECT 75.0 03/08/2016   TRIG 95.0 11/30/2020   CHOLHDL 3 11/30/2020   Lab Results  Component Value Date   CREATININE 1.54 (H) 09/06/2021   BUN 21 09/06/2021   NA 138 09/06/2021   K 4.0 09/06/2021   CL 103 09/06/2021   CO2 27 09/06/2021   CBC Latest Ref Rng & Units 10/28/2021 06/21/2021 11/30/2020  WBC 4.0 - 10.5 K/uL 8.0 7.1 7.3  Hemoglobin 13.0 - 17.0 g/dL 12.9(L) 12.3(L) 12.9(L)  Hematocrit 39.0 - 52.0 % 39.4 37.0(L) 38.5(L)  Platelets 150.0 - 400.0 K/uL 250.0 244.0 261.0    Lab Results  Component Value Date   HGBA1C 6.3 10/28/2021   Lab Results  Component Value Date   TSH 3.05 10/28/2021    ==================================================  COVID-19 Education: The signs and symptoms of COVID-19 were discussed with the patient and how to seek care for testing (follow up with PCP or arrange E-visit).    I spent a total of 19 minutes with the patient spent in direct patient consultation.  Additional time spent with chart review  / charting (studies, outside notes,  etc): 16 min Total Time: 35 min  Current medicines are reviewed at length with the patient today.  (+/- concerns) n/a  This visit occurred during the SARS-CoV-2 public health emergency.  Safety protocols were in place, including screening questions prior to the visit, additional usage of staff PPE, and extensive cleaning of exam room while observing appropriate contact time as indicated for disinfecting solutions.  Notice: This dictation  was prepared with Dragon dictation along with smart phrase technology. Any transcriptional errors that result from this process are unintentional and may not be corrected upon review.  Studies Ordered:   No orders of the defined types were placed in this encounter.   Patient Instructions / Medication Changes & Studies & Tests Ordered   Patient Instructions  Medication Instructions:    STOP TAKING LOSARTAN    START TAKING VALSARTAN 320 MG - THE FIRST 4 DAYS TAKE  1/2 TABLET OF 320 MG THEN INCREASE TO  WHOLE TABLET .   *If you need a refill on your cardiac medications before your next appointment, please call your pharmacy*   Other Instructions   START  MONITORING YOUR BLOOD PRESSURE - THE RANGE SHOULD BE LESS THAN 135/80 MOST OF THE TIME .    Lab Work: NOT NEEDED    Testing/Procedures:  NOT NEEDED  Follow-Up: At [facility], you and your health needs are our priority.  As part of our continuing mission to provide you with exceptional heart care, we have created designated Provider Care Teams.  These Care Teams include your primary Cardiologist (physician) and Advanced Practice Providers (APPs -  Physician Assistants and Nurse Practitioners) who all work together to provide you with the care you need, when you need it.     Your next appointment:   6 month(s)  The format for your next appointment:   In Person  Provider:   Glenetta Hew, MD    Other Instructions   START  MONITORING YOUR BLOOD PRESSURE - [place] 135/80 MOST OF THE TIME .      Glenetta Hew, M.D., M.S. Interventional Cardiologist   Pager # [phone] Phone # [phone] [address] [place], [place] [address]   Thank you for choosing Heartcare at [facility]!!

## 2021-12-29 ENCOUNTER — Telehealth: Payer: Self-pay

## 2021-12-29 ENCOUNTER — Encounter: Payer: Self-pay | Admitting: Cardiology

## 2021-12-29 ENCOUNTER — Other Ambulatory Visit: Payer: Self-pay

## 2021-12-29 MED ORDER — METOPROLOL TARTRATE 25 MG PO TABS: 12.5000 mg | ORAL_TABLET | Freq: Two times a day (BID) | ORAL | 3 refills | Status: DC

## 2021-12-29 NOTE — Assessment & Plan Note (Signed)
Does not routinely use CPAP.

## 2021-12-29 NOTE — Telephone Encounter (Addendum)
Called patient regarding results.patient Advised patient to start metoprolol tartrate 12.5 mg twice daily. Prescription sent to pharmacy.----- Message from Lendon Colonel, NP sent at 12/28/2021  2:53 PM EST ----- Please start the patient on 12.5 mg of metoprolol tartrate twice a day tablet  which he is to take at night for runs of PAT. The cardiac monitor did not show significantly slow HR. Just the bursts of fast HR which was what he might have been feeling. Follow up as scheduled. He is to keep up with his BP. Call us for BP < 120/60.

## 2021-12-29 NOTE — Assessment & Plan Note (Addendum)
Should be due for labs checked soon.  On 40 mg atorvastatin along with [place].  Otherwise not really on much in the way of any glycemic management.

## 2021-12-29 NOTE — Telephone Encounter (Addendum)
Called patient regarding results. Left message.----- Message from Lendon Colonel, NP sent at 12/28/2021  2:53 PM EST ----- Please start the patient on 12.5 mg of metoprolol tartrate twice a day tablet  which he is to take at night for runs of PAT. The cardiac monitor did not show significantly slow HR. Just the bursts of fast HR which was what he might have been feeling. Follow up as scheduled. He is to keep up with his BP. Call us for BP < 120/60.

## 2021-12-29 NOTE — Assessment & Plan Note (Signed)
Discussed increased exercise and dietary modification.  This will help with weight loss but also with blood pressure and lipid control.

## 2021-12-29 NOTE — Telephone Encounter (Signed)
Patient returning call.

## 2021-12-29 NOTE — Assessment & Plan Note (Signed)
No longer bradycardic since he is no longer on beta-blocker.  Unfortunately this takes away 1 declines medication for blood pressure management.  Heart rate l remains relatively low off of beta-blocker, but there are more notable more pronounced PVCs noted on follow-up.  Avoid beta-blocker/calcium channel blocker AV nodal agents.Marland Kitchen

## 2021-12-29 NOTE — Assessment & Plan Note (Addendum)
Remains hypertensive today.  Did not tolerate beta-blockers apparently because of bradycardia and fatigue.  This is somewhat unfortunate because of his PACs, but still unless we can use Bystolic, we are limited.  Thankfully, he seems relatively euvolemic with the use of indapamide and [place].  Seems to be NYHA class II symptoms.  Plan:  Convert from losartan to valsartan 320 mg  Continue amlodipine.  BP log at home (will need a new cuff) recommend checking it intermittently morning and night depending on different days.  Both before and after taking about medications.  Continue indapamide and Farxiga I have.  Also on potassium supplementation -> needs intermittent renal function evaluation.  Creatinine was 1.54 as of October 2022

## 2022-01-02 ENCOUNTER — Other Ambulatory Visit: Payer: Self-pay | Admitting: Internal Medicine

## 2022-01-02 DIAGNOSIS — I10 Essential (primary) hypertension: Secondary | ICD-10-CM

## 2022-01-02 DIAGNOSIS — E876 Hypokalemia: Secondary | ICD-10-CM

## 2022-01-14 ENCOUNTER — Ambulatory Visit (INDEPENDENT_AMBULATORY_CARE_PROVIDER_SITE_OTHER): Payer: Medicare HMO

## 2022-01-14 DIAGNOSIS — J301 Allergic rhinitis due to pollen: Secondary | ICD-10-CM

## 2022-01-14 DIAGNOSIS — I10 Essential (primary) hypertension: Secondary | ICD-10-CM

## 2022-01-14 DIAGNOSIS — E785 Hyperlipidemia, unspecified: Secondary | ICD-10-CM

## 2022-01-14 DIAGNOSIS — N1831 Chronic kidney disease, stage 3a: Secondary | ICD-10-CM

## 2022-01-14 DIAGNOSIS — I119 Hypertensive heart disease without heart failure: Secondary | ICD-10-CM

## 2022-01-14 NOTE — Progress Notes (Signed)
Chronic Care Management Pharmacy Note  01/14/2022 Name:  ULYSEE FYOCK MRN:  [id number] DOB:  [birth year]  Summary: -Patient confirms that he has been adherent to recent medication changes - denies any issues since starting metoprolol and valsartan -Reports that BP has improved, did not have BP logs with him and could not recall recent blood pressure results   Recommendations/Changes made from today's visit: - Recommending no medication changes at this time,  patient to start BP log and discuss results with cardiology  -Advised for patient to have shingrix vaccine with next visit to pharmacy - agreeable   Subjective: CLEOFAS HUDGINS is an 76 y.o. year old male who is a primary patient of Janith Lima, MD.  The CCM team was consulted for assistance with disease management and care coordination needs.    Engaged with patient by telephone for follow up visit in response to provider referral for pharmacy case management and/or care coordination services.   Consent to Services:  The patient was given the following information about Chronic Care Management services today, agreed to services, and gave verbal consent: 1. CCM service includes personalized support from designated clinical staff supervised by the primary care provider, including individualized plan of care and coordination with other care providers 2. 24/7 contact phone numbers for assistance for urgent and routine care needs. 3. Service will only be billed when office clinical staff spend 20 minutes or more in a month to coordinate care. 4. Only one practitioner may furnish and bill the service in a calendar month. 5.The patient may stop CCM services at any time (effective at the end of the month) by phone call to the office staff. 6. The patient will be responsible for cost sharing (co-pay) of up to 20% of the service fee (after annual deductible is met). Patient agreed to services and consent obtained.  Patient Care Team: Janith Lima, MD as PCP - General Ellyn Hack Leonie Green, MD as PCP - Cardiology (Cardiology) Leonie Man, MD as Consulting Physician (Cardiology) Franchot Gallo, MD as Consulting Physician (Urology) Deneise Lever, MD as Consulting Physician (Pulmonary Disease) groat (Dentistry) Delice Bison Darnelle Maffucci, [facility] as Pharmacist (Pharmacist) Glory Rosebush, MD as Consulting Physician ([facility])  Recent office visits:  10/28/2021 - Dr. Ronnald Ramp - no changes to medications - f/u in 3 months    Recent consult visits:  12/27/2021 - Dr. Ellyn Hack - Cardiology - losartan switched to valsartan 386m daily, 12/29/2021 - patient started on metoprolol 12.529mBID   Hospital visits:  None in previous 6 months  Objective:  Lab Results  Component Value Date   CREATININE 1.54 (H) 09/06/2021   BUN 21 09/06/2021   GFR 43.77 (L) 09/06/2021   GFRNONAA 46 (L) 05/17/2019   GFRAA 53 (L) 05/17/2019   NA 138 09/06/2021   K 4.0 09/06/2021   CALCIUM 9.5 09/06/2021   CO2 27 09/06/2021   GLUCOSE 110 (H) 09/06/2021    Lab Results  Component Value Date/Time   HGBA1C 6.3 10/28/2021 02:33 PM   HGBA1C 6.5 06/21/2021 09:52 AM   HGBA1C 5.9 10/15/2018 09:50 AM   GFR 43.77 (L) 09/06/2021 09:12 AM   GFR 45.98 (L) 06/21/2021 09:52 AM   MICROALBUR 6.9 (H) 10/28/2021 02:33 PM   MICROALBUR 21.0 (H) 11/30/2020 10:23 AM    Last diabetic Eye exam:  Lab Results  Component Value Date/Time   HMDIABEYEEXA No Retinopathy 10/07/2021 12:00 AM    Last diabetic Foot exam:  No results found for: HMDIABFOOTEX  Lab Results  Component Value Date   CHOL 171 11/30/2020   HDL 63.70 11/30/2020   LDLCALC 89 11/30/2020   LDLDIRECT 75.0 03/08/2016   TRIG 95.0 11/30/2020   CHOLHDL 3 11/30/2020    Hepatic Function Latest Ref Rng & Units 11/30/2020 10/15/2018 12/07/2017  Total Protein 6.0 - 8.3 g/dL 7.5 7.5 7.5  Albumin 3.5 - 5.2 g/dL 4.7 4.5 4.3  AST 0 - 37 U/L 15 19 11   ALT 0 - 53 U/L 17 20 13   Alk Phosphatase 39 - 117 U/L  69 59 70  Total Bilirubin 0.2 - 1.2 mg/dL 0.5 0.5 0.6  Bilirubin, Direct 0.0 - 0.3 mg/dL 0.1 - -    Lab Results  Component Value Date/Time   TSH 3.05 10/28/2021 02:33 PM   TSH 1.25 06/21/2021 09:52 AM   FREET4 1.14 07/13/2020 09:51 AM   FREET4 1.01 01/10/2020 08:43 AM    CBC Latest Ref Rng & Units 10/28/2021 06/21/2021 11/30/2020  WBC 4.0 - 10.5 K/uL 8.0 7.1 7.3  Hemoglobin 13.0 - 17.0 g/dL 12.9(L) 12.3(L) 12.9(L)  Hematocrit 39.0 - 52.0 % 39.4 37.0(L) 38.5(L)  Platelets 150.0 - 400.0 K/uL 250.0 244.0 261.0    Lab Results  Component Value Date/Time   VD25OH 34.37 10/15/2018 10:32 AM    Clinical ASCVD: No  The 10-year ASCVD risk score (Arnett DK, et al., 2019) is: 44.6%   Values used to calculate the score:     Age: 76 years     Sex: Male     Is Non-Hispanic African American: Yes     Diabetic: Yes     Tobacco smoker: No     Systolic Blood Pressure: 194 mmHg     Is BP treated: Yes     HDL Cholesterol: 63.7 mg/dL     Total Cholesterol: 171 mg/dL    Depression screen [facility] 2/9 12/24/2021 06/21/2021 09/18/2020  Decreased Interest 0 0 0  Down, Depressed, Hopeless 0 0 0  PHQ - 2 Score 0 0 0  Some recent data might be hidden     Social History   Tobacco Use  Smoking Status Never  Smokeless Tobacco Never   BP Readings from Last 3 Encounters:  12/27/21 (!) 150/70  10/28/21 138/76  09/17/21 (!) 146/82   Pulse Readings from Last 3 Encounters:  12/27/21 (!) 57  10/28/21 66  09/17/21 (!) 58   Wt Readings from Last 3 Encounters:  12/27/21 215 lb (97.5 kg)  10/28/21 218 lb (98.9 kg)  09/17/21 214 lb 12.8 oz (97.4 kg)   BMI Readings from Last 3 Encounters:  12/27/21 30.85 kg/m  10/28/21 31.28 kg/m  09/17/21 30.82 kg/m    Assessment/Interventions: Review of patient past medical history, allergies, medications, health status, including review of consultants reports, laboratory and other test data, was performed as part of comprehensive evaluation and provision of  chronic care management services.   SDOH:  (Social Determinants of Health) assessments and interventions performed: Yes  SDOH Screenings   Alcohol Screen: Low Risk    Last Alcohol Screening Score (AUDIT): 0  Depression (PHQ2-9): Low Risk    PHQ-2 Score: 0  Financial Resource Strain: Low Risk    Difficulty of Paying Living Expenses: Not hard at all  Food Insecurity: No Food Insecurity   Worried About Charity fundraiser in the Last Year: Never true   Ran Out of Food in the Last Year: Never true  Housing: Low Risk    Last Housing Risk Score: 0  Physical  Activity: Sufficiently Active   Days of Exercise per Week: 7 days   Minutes of Exercise per Session: 30 min  Social Connections: Socially Integrated   Frequency of Communication with Friends and Family: More than three times a week   Frequency of Social Gatherings with Friends and Family: Not on file   Attends Religious Services: More than 4 times per year   Active Member of [employer] or Organizations: Yes   Attends Music therapist: More than 4 times per year   Marital Status: Married  Stress: No Stress Concern Present   Feeling of Stress : Not at all  Tobacco Use: Low Risk    Smoking Tobacco Use: Never   Smokeless Tobacco Use: Never   Passive Exposure: Not on file  Transportation Needs: No Transportation Needs   Lack of Transportation (Medical): No   Lack of Transportation (Non-Medical): No    CCM Care Plan  Allergies  Allergen Reactions   Lisinopril Other (See Comments)    Edema in face    Medications Reviewed Today     Reviewed by Tomasa Blase, [facility] (Pharmacist) on 01/14/22 at 0903  Med List Status: <None>   Medication Order Taking? Sig Documenting Provider Last Dose Status Informant  albuterol (VENTOLIN HFA) 108 (90 Base) MCG/ACT inhaler [id number] Yes Inhale 2 puffs into the lungs every 6 (six) hours as needed for wheezing or shortness of breath. Janith Lima, MD Taking Active   amLODipine  (NORVASC) 10 MG tablet [id number] Yes TAKE 1 TABLET BY MOUTH EVERY DAY Janith Lima, MD Taking Active   atorvastatin (LIPITOR) 40 MG tablet [id number] Yes TAKE 1 TABLET BY MOUTH EVERY DAY Janith Lima, MD Taking Active   Blood Pressure Monitoring (BLOOD PRESSURE MONITOR/ARM) DEVI [id number]  1 Device by Does not apply route daily. USE DAILY TO  TAKE BLOOD PRESSURE READINGS Leonie Man, MD  Active   cetirizine (ZYRTEC) 5 MG tablet [id number] Yes Take 5 mg by mouth daily. [provider] Taking Active   cholecalciferol (VITAMIN D3) 25 MCG (1000 UNIT) tablet [id number] Yes Take 1,000 Units by mouth daily. [provider] Taking Active   COVID-19 mRNA bivalent vaccine, Pfizer, injection [id number] Yes Inject into the muscle. Carlyle Basques, MD Taking Active   docusate sodium (COLACE) 100 MG capsule [id number] Yes Take 100 mg by mouth 2 (two) times daily. [provider] Taking Active   FARXIGA 10 MG TABS tablet [id number] Yes TAKE 1 TABLET BY MOUTH DAILY BEFORE BREAKFAST. Janith Lima, MD Taking Active   fluticasone [facility]) 50 MCG/ACT nasal spray [id number] Yes Place 2 sprays into both nostrils daily. Janith Lima, MD Taking Active Self  indapamide (LOZOL) 1.25 MG tablet [id number] Yes TAKE 1 TABLET BY MOUTH EVERY DAY Janith Lima, MD Taking Active   KLOR-CON M20 20 MEQ tablet [id number] Yes TAKE 1 TABLET BY MOUTH TWICE A DAY Janith Lima, MD Taking Active   levothyroxine (SYNTHROID) 112 MCG tablet [id number] Yes TAKE 1 TABLET BY MOUTH EVERY DAY Shamleffer, Melanie Crazier, MD Taking Active   metoprolol tartrate (LOPRESSOR) 25 MG tablet [id number] Yes Take 0.5 tablets (12.5 mg total) by mouth 2 (two) times daily. Lendon Colonel, NP Taking Active   mometasone-formoterol [facility]) 100-5 MCG/ACT Hollie Salk [id number] Yes Inhale 2 puffs into the lungs 2 (two) times daily. Janith Lima, MD Taking Active   Multiple Vitamin (MULTIVITAMIN) tablet [id number] Yes Take 1 tablet  by mouth daily. [provider] Taking Active Self  omeprazole (PRILOSEC) 40 MG capsule [id number] Yes TAKE 1 CAPSULE (40 MG TOTAL) BY MOUTH DAILY. Janith Lima, MD Taking Active   Polyethyl Glycol-Propyl Glycol 0.4-0.3 % SOLN [id number] Yes Apply 1 drop to eye as needed. [provider] Taking Active   tamsulosin (FLOMAX) 0.4 MG CAPS capsule [id number] Yes Take 1 capsule (0.4 mg total) by mouth daily after supper. Janith Lima, MD Taking Active   valsartan (DIOVAN) 320 MG tablet [id number] Yes Take 1 tablet (320 mg total) by mouth daily. Leonie Man, MD Taking Active   vitamin C (ASCORBIC ACID) 500 MG tablet [id number] Yes Take 500 mg by mouth daily. [provider] Taking Active             Patient Active Problem List   Diagnosis Date Noted   PAC (premature atrial contraction) 12/27/2021   Encounter for general adult medical examination with abnormal findings 06/22/2021   Bradycardia 11/30/2020   Graves' orbitopathy 07/13/2020   Type II diabetes mellitus with manifestations ([place]) 05/13/2020   Graves disease 10/07/2019   Postablative hypothyroidism 10/07/2019   Thyroid-related proptosis 08/15/2019   Chronic renal disease, stage 3, moderately decreased glomerular filtration rate (GFR) between 30-59 mL/min/1.73 square meter ([place]) 08/15/2019   Long-term current use of opiate analgesic 05/14/2018   Bilateral epiphora 11/16/2017   Thiamine deficiency 06/08/2017   Obesity (BMI 30.0-34.9) 06/04/2017   Hypertensive heart disease without CHF (congestive heart failure) 12/25/2016   Right cervical radiculopathy 06/16/2016   Iron deficiency anemia 03/08/2016   Spinal stenosis in cervical region 08/25/2015   Allergic eczema 08/07/2014   Obstructive sleep apnea 11/12/2013   Mild intermittent asthma 06/03/2013   Herniated lumbar disc without myelopathy 10/18/2012   Allergic rhinitis 03/10/2011   Cardiomegaly 10/18/2010   ERECTILE DYSFUNCTION, NON-ORGANIC, MILD  06/29/2009   Hyperlipidemia associated with type 2 diabetes mellitus ([place]) 05/28/2009   Essential hypertension, benign 05/28/2009   GERD 05/28/2009   BPH associated with nocturia 05/28/2009   Primary osteoarthritis of right knee 05/28/2009    Immunization History  Administered Date(s) Administered   Fluad Quad(high Dose 65+) 07/27/2019   Influenza Split 10/24/2011   Influenza Whole 08/19/2009, 07/23/2010   Influenza, High Dose Seasonal PF 10/21/2013, 09/27/2016, 09/07/2017, 09/14/2018, 08/18/2021   Influenza,inj,Quad PF,6+ Mos 08/07/2014, 08/25/2015   Influenza-Unspecified 08/28/2020, 08/18/2021   PFIZER Comirnaty(Gray Top)Covid-19 Tri-Sucrose Vaccine 04/17/2021   PFIZER(Purple Top)SARS-COV-2 Vaccination 01/27/2020, 02/25/2020, 08/28/2020   Pfizer Covid-19 Vaccine Bivalent Booster 22yr & up 09/06/2021   Pneumococcal Conjugate-13 08/25/2015   Pneumococcal Polysaccharide-23 11/28/2004, 03/08/2016   Td 11/28/2004   Tdap 08/25/2015   Zoster Recombinat (Shingrix) 08/23/2019   Zoster, Live 02/24/2011    Conditions to be addressed/monitored:  Hypertension, Hyperlipidemia, Prediabetes, Heart Failure, GERD, Asthma, Chronic Kidney Disease, Hypothyroidism, BPH, and Allergic Rhinitis  Care Plan : CCM Care Plan  Updates made by STomasa Blase RPH since 01/14/2022 12:00 AM     Problem: HTN, HF, HLD, CKD, Hypothyroidism, Allergic Rhinitis, BPH, GERD, Prediabetes   Priority: High  Onset Date: 07/02/2021     Long-Range Goal: Disease Management   Start Date: 07/02/2021  Expected End Date: 01/14/2023  This Visit's Progress: On track  Recent Progress: On track  Priority: High  Note:   Current Barriers:  Unable to independently monitor therapeutic efficacy Unable to achieve control of Blood pressure   Pharmacist Clinical Goal(s):  Patient will achieve adherence to monitoring guidelines and medication adherence to achieve therapeutic efficacy achieve control of blood pressure as evidenced  by blood  pressure logs maintain control of LDL, asthma, hypothyroidism, CKD, and A1c as evidenced by next lab results, and asthma exacerbation frequency  through collaboration with PharmD and provider.   Interventions: 1:1 collaboration with Janith Lima, MD regarding development and update of comprehensive plan of care as evidenced by provider attestation and co-signature Inter-disciplinary care team collaboration (see longitudinal plan of care) Comprehensive medication review performed; medication list updated in electronic medical record  Hyperlipidemia: (LDL goal < 100) -Controlled Lab Results  Component Value Date   [place] 89 11/30/2020  -Current treatment: Atorvastatin 68m - 1 tablet daily  -Medications previously tried: rosuvastatin, pravastatin   -Current dietary patterns: reports that he is reducing amount of foods high in cholesterol that he is eating, increasing lean protein intake -Current exercise habits: walking for about 30 minutes daily  -Educated on Cholesterol goals;  Benefits of statin for ASCVD risk reduction; Importance of limiting foods high in cholesterol; Exercise goal of 150 minutes per week; -Counseled on diet and exercise extensively Recommended to continue current medication  Heart Failure (Goal: manage symptoms and prevent exacerbations) / Hypertension (BP goal <140/90) -Improved  -Last ejection fraction: 47% (Date: 06/2013 - estimated via myoview - thought to be underestimation) -NYHA Class: II (slight limitation of activity)-III -Current treatment: Indapamide 1.232m- 1 tablet daily  Valsartan 32039m 1 tablet daily  Metoprolol tartrate 32m67m1/2 tablet BID  Amlodipine 10mg13m tablet daily  Potassium Chloride 20 mEq - 1 tablet twice daily  Lab Results  Component Value Date   K 4.0 09/06/2021  -Medications previously tried: valsartan, hydrochlorothiazide, azilsartan, losartan, coreg    -Current home BP/HR readings: Reports that BP has  improved since recent medication changes, could not recall recent readings, agreeable to start BP log  -Current dietary habits: reports that he does not salt his food, tries to follow low sodium diet -Current exercise habits: walking daily for 30 minutes, plans to start using gym more often and becoming more active  -Educated on Benefits of medications for managing symptoms and prolonging life Proper diuretic administration and potassium supplementation Importance of blood pressure control -Counseled on diet and exercise extensively Recommended to continue current medications   Asthma (Goal: control symptoms and prevent exacerbations) -Controlled -Current treatment  Mometasone-Formoterol ([place]) 100-5mcg 59m puffs twice daily - not currently using  Albuterol 108mcg/41m- 2 puffs every 6 hours as needed  -Medications previously tried: albuterol HFA inhaler  -Exacerbations requiring treatment in last 6 months: n/a -Patient denies consistent use of maintenance inhaler -Frequency of rescue inhaler use: n/a -Counseled on Proper inhaler technique; Benefits of consistent maintenance inhaler use When to use rescue inhaler Differences between maintenance and rescue inhalers -Recommended for patient to continue current medications  Hypothyroidism (Goal: Maintenance of euthyroid levels) -Controlled Lab Results  Component Value Date   TSH 3.05 10/28/2021  -Current treatment  Levothyroxine 112mcg -40mablet daily  -Medications previously tried: n/a  -Recommended to continue current medication  GERD (Goal: Prevention/ control of acid reflux) -Controlled -Current treatment  Omeprazole 40mg - 164msule daily  -Medications previously tried: Dexlansoprazole, lansoprazole  -Counseled on diet and exercise extensively Recommended for patient to restart omeprazole 40mg dail59me to cost of dexliant   BPH (Goal: Prevention / treatment of urinary symptoms) -Controlled -Current treatment   Tamsulosin 0.4mg - 1 ca37mle daily  -Medications previously tried: oxybutynin, dutasteride, solifenacin  -Recommended to continue current medication  Prediabetes (A1c goal <6.5%) -Controlled Lab Results  Component Value Date  HGBA1C 6.3 10/28/2021  -Current meal patterns:  breakfast: eggs with toast, yogurt, coffee   lunch: tuna salad, chicken wings, vegetables  dinner: chicken, vegetable stew / soup snacks: does not typically eat drinks: apple / cranberry juice, orange juice -Current exercise: walking for 30 minutes daily  -Educated on A1c and blood sugar goals; Complications of diabetes including kidney damage, retinal damage, and cardiovascular disease; Exercise goal of 150 minutes per week; Benefits of weight loss; -Counseled to check feet daily and get yearly eye exams -Counseled on diet and exercise extensively Recommended to continue current medication  Chronic Kidney Disease (Goal: Prevention of disease progression) -stable  -Last eGFR:43.77 mL/min -Current treatment  Farxiga 22m - 1 tablet daily  -Medications previously tried: n/a  -Recommended avoidance of nephrotoxic agents, discussed importance of adequate BP and BG control to avoid kidney damage   Allergic Rhinitis (Goal: treament / control of allergies) -Controlled -Current treatment  Cetirizine 51m- 1 tablet daily  Fluticasone 5060mact - 2 sprays into each nostril once daily if needed  -Medications previously tried: n/a  -Recommended to continue current medication   Health Maintenance -Vaccine gaps: Shingles and Flu vaccine  -Current therapy:  Multivitamin - 1 tablet daily  Vitamin C 500m14m1 tablet daily  Vitamin D3 1000 units - 1 tablet daily  Docusate 100mg62m capsule twice daily  Systane Eye drops - 1 drop into each eye as needed for dry eyes  -Educated on Cost vs benefit of each product must be carefully weighed by individual consumer -Patient is satisfied with current therapy and denies  issues -Recommended to continue current medication  Patient Goals/Self-Care Activities Patient will:  - take medications as prescribed check blood pressure 3 times weekly, document, and provide at future appointments target a minimum of 150 minutes of moderate intensity exercise weekly engage in dietary modifications by reducing sodium intake / moderation of carb intake / reduction of intake in foods high in cholesterol  Follow Up Plan: Telephone follow up appointment with care management team member scheduled for: 3 months The patient has been provided with contact information for the care management team and has been advised to call with any health related questions or concerns.          Medication Assistance: None required.  Patient affirms current coverage meets needs.  Patient's preferred pharmacy is:  [id number] [address]: [phone] #[id number] [id number] [phone] [id number] [phone] box? Yes Pt endorses 100% compliance  Care Plan and Follow Up Patient Decision:  Patient agrees to Care Plan and Follow-up.  Plan: Telephone follow up appointment with care management team member scheduled for:  3 months and The patient has been provided with contact information for the care management team and has been advised to call with any health related questions or concerns.   Makisha Marrin CTomasa Blase Clinical Pharmacist, [place]

## 2022-01-14 NOTE — Patient Instructions (Signed)
Visit Information  Following are the goals we discussed today:   Track and Manage My Blood Pressure   Timeframe:  Long-Range Goal Priority:  High Start Date:  07/02/2021                           Expected End Date:  01/02/2023                    Follow Up Date 04/13/2022   - check blood pressure 3 times per week - choose a place to take my blood pressure (home, clinic or office, retail store) - write blood pressure results in a log or diary    Why is this important?   You won't feel high blood pressure, but it can still hurt your blood vessels.  High blood pressure can cause heart or kidney problems. It can also cause a stroke.  Making lifestyle changes like losing a little weight or eating less salt will help.  Checking your blood pressure at home and at different times of the day can help to control blood pressure.  If the doctor prescribes medicine remember to take it the way the doctor ordered.  Call the office if you cannot afford the medicine or if there are questions about it.   Plan: Telephone follow up appointment with care management team member scheduled for:  3 months  The patient has been provided with contact information for the care management team and has been advised to call with any health related questions or concerns.   Tomasa Blase, PharmD Clinical Pharmacist, [facility]   Please call the care guide team at [phone] if you need to cancel or reschedule your appointment.   Patient verbalizes understanding of instructions and care plan provided today and agrees to view in [place]. Active MyChart status confirmed with patient.

## 2022-01-25 DIAGNOSIS — I119 Hypertensive heart disease without heart failure: Secondary | ICD-10-CM

## 2022-01-25 DIAGNOSIS — E785 Hyperlipidemia, unspecified: Secondary | ICD-10-CM

## 2022-01-25 DIAGNOSIS — N1831 Chronic kidney disease, stage 3a: Secondary | ICD-10-CM | POA: Diagnosis not present

## 2022-02-14 ENCOUNTER — Ambulatory Visit: Payer: Medicare HMO | Admitting: Internal Medicine

## 2022-02-14 NOTE — Progress Notes (Deleted)
? ?Name: Ronald Bond  ?MRN/ DOB: [id number], [birth year]    ?Age/ Sex: 76 y.o., male   ? ? ?PCP: Janith Lima, MD   ?Reason for Endocrinology Evaluation:  Graves' disease  ?   ?Initial Endocrinology Clinic Visit: 10/07/2019  ? ? ?PATIENT IDENTIFIER: [name] is a 76 y.o., male with a past medical history of Graves' disease, HTN, dyslipidemia and CHF. He has followed with [facility] since 10/07/2019 for consultative assistance with management of his [name]' disease ? ?HISTORICAL SUMMARY: The patient was first diagnosed with hyperthyroidism 1974 , requiring total thyroidectomy with benign pathology, but a couple of years later the pt was noted with hyperthyroidism again due tissue regrowth, requiring  RAI ablation , and was on LT-4 replacement until 07/2019 when this was stopped due to a low TSH 0.12 uIU/mL  ? ?But the patient restarted his L T4 replacement weeks later due to palpitation and abnormal sensation. ? 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  ?He was treated with prednisone, without any improvement in his vision.  He was unable to obtain the [place] due to shortage per patient.  He is S/P radiation therapy x3  in [place]  In 2021  ?  ?He normally sees Dr. Katy Fitch locally and has an appointment to see him today  ?  ?Mother with graves' disease  ? ? ?SUBJECTIVE:  ? ?Today (02/14/2022):  Ronald Bond is here for follow-up on Graves' disease.   ? ? ?Was evaluated by cardiology 12/2021 ? ?He completed radiation therapy for Grave's orbitopathy in [place].  S/P right eye sx  05/2020 and 01/2021 ? ? ?Orbital MRI 09/2021 showed slight increase in proptosis  ? ?Denies dizziness  ?Denies constipation or diarrhea  ?Weight has been stable  ? ? ?Has been taking Levothyroxine 112 mcg daily regularly, denies missing any doses.  ? ? ?HISTORY:  ?Past Medical History:  ?Past Medical History:  ?Diagnosis Date  ? GERD (gastroesophageal reflux disease)   ? HTN (hypertension)   ? Hyperlipidemia   ? Hypothyroidism   ? Osteoarthritis   ? Wears glasses   ? ?Past Surgical History:  ?Past Surgical History:  ?Procedure Laterality Date  ? COLONOSCOPY    ? EYE SURGERY    ? both cataracts  ? MICROLARYNGOSCOPY Left 11/18/2013  ? Procedure: MICROLARYNGOSCOPY WITH REMOVAL OF GRANULOMA LEFT SIDE;  Surgeon: Izora Gala, MD;  Location: [facility];  Service: ENT;  Laterality: Left;  ? NM MYOVIEW LTD  06/2007  ? Low risk, normal.  No evidence of ischemia or infarction  ? THYROIDECTOMY  1974  ? TONSILLECTOMY    ? TOTAL KNEE ARTHROPLASTY  2009  ? left  ? ?Social History:  reports that he has never smoked. He has never used smokeless tobacco. He reports current alcohol use. He reports that he does not use drugs. ?Family History:  ?Family History  ?Problem Relation Age of Onset  ? Brain cancer Mother   ? Heart disease Father   ? Heart disease Brother   ? ? ? ?HOME MEDICATIONS: ?Allergies as of 02/14/2022   ? ?  Reactions  ? Lisinopril Other (See Comments)  ? Edema in face  ? ?  ? ?  ?Medication List  ?  ? ?  ? Accurate as of February 14, 2022  7:13 AM. If you have any questions, ask your nurse or doctor.  ?  ?  ? ?  ? ?albuterol 108 (90 Base) MCG/ACT inhaler ?Commonly known as: VENTOLIN HFA ?Inhale 2 puffs into the lungs every 6 (six) hours as needed for wheezing or shortness of breath. ?  ?amLODipine 10 MG tablet ?Commonly known as: NORVASC ?TAKE 1 TABLET BY MOUTH EVERY DAY ?  ?atorvastatin 40 MG tablet ?Commonly known as: LIPITOR ?TAKE 1 TABLET BY MOUTH EVERY DAY ?  ?Blood Pressure Monitor/Arm Devi ?1 Device by  Does not apply route daily. USE DAILY TO  TAKE BLOOD PRESSURE READINGS ?  ?cetirizine 5 MG tablet ?Commonly known as: ZYRTEC ?Take 5 mg by mouth daily. ?  ?cholecalciferol 25 MCG (1000 UNIT) tablet ?Commonly known as: VITAMIN D3 ?Take 1,000 Units by mouth daily. ?  ?docusate sodium 100 MG capsule ?Commonly known as: COLACE ?Take 100 mg by mouth 2 (two) times daily. ?  ?Farxiga 10 MG Tabs tablet ?Generic drug: dapagliflozin propanediol ?TAKE 1 TABLET BY MOUTH DAILY BEFORE BREAKFAST. ?  ?fluticasone 50 MCG/ACT nasal spray ?Commonly known as: FLONASE ?Place 2 sprays into both nostrils daily. ?  ?indapamide 1.25 MG tablet ?Commonly known as: LOZOL ?TAKE 1 TABLET BY MOUTH EVERY DAY ?  ?Klor-Con M20 20 MEQ tablet ?Generic drug: potassium chloride SA ?TAKE 1 TABLET BY MOUTH TWICE A DAY ?  ?levothyroxine 112 MCG tablet ?Commonly known as: SYNTHROID ?TAKE 1 TABLET BY MOUTH EVERY DAY ?  ?metoprolol tartrate 25 MG tablet ?Commonly known as: LOPRESSOR ?Take 0.5 tablets (12.5 mg total) by mouth 2 (two) times daily. ?  ?mometasone-formoterol 100-5 MCG/ACT Aero ?Commonly known as: DULERA ?Inhale 2 puffs into the lungs 2 (two) times daily. ?  ?multivitamin tablet ?Take 1 tablet by mouth daily. ?  ?omeprazole 40 MG capsule ?Commonly known as: PRILOSEC ?TAKE 1 CAPSULE (40 MG TOTAL) BY MOUTH DAILY. ?  ?Pfizer COVID-19 Vac Bivalent injection ?Generic drug: COVID-19 mRNA bivalent vaccine [employer]) ?Inject into the muscle. ?  ?Polyethyl Glycol-Propyl Glycol 0.4-0.3 % Soln ?Apply 1 drop to eye as needed. ?  ?tamsulosin 0.4 MG Caps capsule ?Commonly known as: FLOMAX ?Take 1 capsule (0.4 mg total) by mouth daily after supper. ?  ?valsartan 320 MG tablet ?Commonly known as: DIOVAN ?Take 1 tablet (320 mg total) by mouth daily. ?  ?vitamin C 500 MG tablet ?Commonly known as: ASCORBIC ACID ?Take 500 mg by mouth daily. ?  ? ?  ? ? ? ? ?OBJECTIVE:  ? ?PHYSICAL EXAM: ?VS: There were no vitals taken for this visit.  ? ?EXAM: ?General: Pt appears  well and is in NAD  ?Eyes: External eye exam shows improved bilateral  proptosis  ?Neck: General: Supple without adenopathy. ?Thyroid: No goiter or nodules appreciated.   ?Lungs: Clear with good BS bilat with no rales, rhonchi, or wheezes  ?Heart: Auscultation: RRR.  ?Extremities:  ?BL LE: No pretibial edema normal ROM and strength.  ?Mental Status: Judgment, insight: Intact ?Memory: Intact for recent and remote events ?Mood and affect: No depression, anxiety, or agitation  ? ? ? ?DATA REVIEWED: ?Results for JUEL, BELLEROSE" (MRN [id number]) as of 02/08/2021 11:11 ? Ref. Range 07/13/2020 09:51 11/30/2020 10:23  ?TSH Latest Ref Range: 0.35 - 4.50 uIU/mL 2.16 3.64  ? ? ? ?  07/2019 ?TSI : 474 (H)  ?  ? ? ?Orbital MRI 10/08/2021 ?IMPRESSION:  ? ?Findings of thyroid eye disease bilaterally with enlarged right extraocular muscles crowding the right orbital apex. Compared to 04/08/2020 MRI, the proptosis is slightly increased, accounting for differences in technique. ? ?ASSESSMENT / PLAN / RECOMMENDATIONS:  ? ?Graves' disease, status post thyroidectomy followed by radioactive iodine ablation: ? ?-The patient is clinically euthyroid ?- No local neck symptoms  ?- Pt educated extensively on the correct way to take levothyroxine (first thing in the morning with water, 30 minutes before eating or taking other medications). ?- Pt encouraged to double dose the following day if she were to miss a dose given long half-life of levothyroxine. ? ? ? ? ?Medications  ? ?Continue Levothyroxine 112 MCG daily ? ?2. Graves' Orbitopathy : ?  ? ?- S/P radiation therapy, prednisone therapy and recently sx on the right eye ?- Diplopia resolved ?- We did discuss that graves' disease could attack the eyes even with euthyroid status and he needs to continue to be under the care of ophthalmology.  ? ? ? ? ? ? ?Follow-up in 1 yr  ? ? ? ? ? ? ?Signed electronically by: ?Abby Nena Jordan, MD ? ?[facility]  ?[facility] ?[address] ?[place], [place] [address] ?Phone: [phone] ?FAX: [phone]  ? ? ? ? ?CC: ?Janith Lima, MD ?[place] [address] ?Phone: [phone]  ?Fax:

## 2022-03-06 ENCOUNTER — Other Ambulatory Visit: Payer: Self-pay | Admitting: Cardiology

## 2022-03-06 DIAGNOSIS — I10 Essential (primary) hypertension: Secondary | ICD-10-CM

## 2022-03-06 DIAGNOSIS — E118 Type 2 diabetes mellitus with unspecified complications: Secondary | ICD-10-CM

## 2022-03-06 DIAGNOSIS — I119 Hypertensive heart disease without heart failure: Secondary | ICD-10-CM

## 2022-03-24 ENCOUNTER — Other Ambulatory Visit: Payer: Self-pay | Admitting: Internal Medicine

## 2022-03-24 DIAGNOSIS — I119 Hypertensive heart disease without heart failure: Secondary | ICD-10-CM

## 2022-03-28 DIAGNOSIS — N401 Enlarged prostate with lower urinary tract symptoms: Secondary | ICD-10-CM | POA: Diagnosis not present

## 2022-03-28 DIAGNOSIS — N3941 Urge incontinence: Secondary | ICD-10-CM | POA: Diagnosis not present

## 2022-03-28 DIAGNOSIS — R351 Nocturia: Secondary | ICD-10-CM | POA: Diagnosis not present

## 2022-03-31 ENCOUNTER — Encounter: Payer: Self-pay | Admitting: Family Medicine

## 2022-03-31 ENCOUNTER — Ambulatory Visit (INDEPENDENT_AMBULATORY_CARE_PROVIDER_SITE_OTHER): Payer: Medicare HMO | Admitting: Family Medicine

## 2022-03-31 VITALS — BP 162/74 | HR 45 | Temp 97.6°F | Ht 70.0 in | Wt 206.0 lb

## 2022-03-31 DIAGNOSIS — M25562 Pain in left knee: Secondary | ICD-10-CM | POA: Diagnosis not present

## 2022-03-31 DIAGNOSIS — Z96652 Presence of left artificial knee joint: Secondary | ICD-10-CM | POA: Diagnosis not present

## 2022-03-31 MED ORDER — MELOXICAM 7.5 MG PO TABS: 7.5000 mg | ORAL_TABLET | Freq: Every day | ORAL | 0 refills | Status: DC

## 2022-03-31 NOTE — Progress Notes (Signed)
? ?Subjective:  ? ? ? Patient ID: Ronald Bond, male    DOB: Nov 02, 1946, 76 y.o.   MRN: [id number] ? ?Chief Complaint  ?Patient presents with  ? Knee Pain  ?  Both, but left leg is the worst (had knee replacement on LL). Has been going on for about 4 weeks, does mention he has been moving.  ? ? ?Knee Pain  ? ?Patient is in today for a 4 week hx of bilateral knee pain. Reports recent moving and lifting heavy boxes making knee pain worse. Left knee is most painful. No known injury.  ?Knee replacement done 10 years ago by Dr. French Ana. States he has not had any trouble with his knee until recent months.  ? ?States his left knee was clicking and popping a few months ago but not now.  ?States he can barely walk on it.  ? ?He has been taking Tylenol, elevating and icing his knee. He also has diclofenac gel and a compression device.  ? ?Works for the [employer] and has to do a lot of walking on the platforms. 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  ? ?Denies fever, chills, dizziness, chest pain, palpitations, shortness of breath, abdominal pain, N/V/D. ? ? ? ? ?Health Maintenance Due  ?Topic Date Due  ? FOOT EXAM  11/30/2021  ? ? ?Past Medical History:  ?Diagnosis Date  ? GERD (gastroesophageal reflux disease)   ? HTN (hypertension)   ? Hyperlipidemia   ? Hypothyroidism   ? Osteoarthritis   ? Wears glasses   ? ? ?Past Surgical History:  ?Procedure Laterality Date  ? COLONOSCOPY    ? EYE SURGERY    ? both cataracts  ? MICROLARYNGOSCOPY Left 11/18/2013  ? Procedure: MICROLARYNGOSCOPY WITH REMOVAL OF GRANULOMA LEFT SIDE;  Surgeon: Izora Gala, MD;  Location: [facility];  Service: ENT;  Laterality: Left;  ? NM MYOVIEW LTD  06/2007  ? Low risk, normal.  No evidence of ischemia or infarction  ? THYROIDECTOMY  1974  ? TONSILLECTOMY    ? TOTAL KNEE ARTHROPLASTY  2009  ? left  ? ? ?Family History  ?Problem Relation Age of Onset  ? Brain cancer Mother   ? Heart disease Father    ? Heart disease Brother   ? ? ?Social History  ? ?Socioeconomic History  ? Marital status: Married  ?  Spouse name: Not on file  ? Number of children: 2  ? Years of education: Not on file  ? Highest education level: Not on file  ?Occupational History  ? Occupation: railroad  ?Tobacco Use  ? Smoking status: Never  ? Smokeless tobacco: Never  ?Vaping Use  ? Vaping Use: Never used  ?Substance and Sexual Activity  ? Alcohol use: Yes  ?  Comment: occassionally  ? Drug use: No  ? Sexual activity: Yes  ?Other Topics Concern  ? Not on file  ?Social History Narrative  ? Regular Exercise -  YES  ?   ? 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  He states that he enjoys  the job a lot does a lot of walking and climbing stairs without significant discomfort.  ?   ? He usually works ~2 weeks @ a time - stays in [employer] (paid for by  the company) - & comes home for ~4 d weekends.  ? ?Social Determinants of Health  ? ?Financial Resource Strain: Low Risk   ? Difficulty of Paying Living Expenses: Not hard at all  ?Food Insecurity: No Food Insecurity  ? Worried About Charity fundraiser in the Last Year: Never true  ? Ran Out of Food in the Last Year: Never true  ?Transportation Needs: No Transportation Needs  ? Lack of Transportation (Medical): No  ? Lack of Transportation (Non-Medical): No  ?Physical Activity: Sufficiently Active  ? Days of Exercise per Week: 7 days  ? Minutes of Exercise per Session: 30 min  ?Stress: No Stress Concern Present  ? Feeling of Stress : Not at all  ?Social Connections: Socially Integrated  ? Frequency of Communication with Friends and Family: More than three times a week  ? Frequency of Social Gatherings with Friends and Family: Not on file  ? Attends Religious Services: More than 4 times per year  ? Active Member of Clubs or Organizations: Yes  ? Attends Theatre manager Meetings: More than 4 times per year  ? Marital Status: Married  ?Intimate Partner Violence: Not At Risk  ? Fear of Current or Ex-Partner: No  ? Emotionally Abused: No  ? Physically Abused: No  ? Sexually Abused: No  ? ? ?Outpatient Medications Prior to Visit  ?Medication Sig Dispense Refill  ? albuterol (VENTOLIN HFA) 108 (90 Base) MCG/ACT inhaler Inhale 2 puffs into the lungs every 6 (six) hours as needed for wheezing or shortness of breath. 18 g 3  ? amLODipine (NORVASC) 10 MG tablet TAKE 1 TABLET BY MOUTH EVERY DAY 90 tablet 1  ? atorvastatin (LIPITOR) 40 MG tablet TAKE 1 TABLET BY MOUTH EVERY DAY 90 tablet 1  ? Blood Pressure Monitoring (BLOOD PRESSURE MONITOR/ARM) DEVI 1 Device by Does not apply route daily. USE DAILY TO  TAKE BLOOD PRESSURE READINGS 1 each 0  ? cetirizine (ZYRTEC) 5 MG tablet Take 5 mg by mouth daily.    ? cholecalciferol (VITAMIN D3) 25 MCG (1000 UNIT) tablet Take 1,000 Units by mouth daily.    ? COVID-19 mRNA bivalent vaccine, Pfizer, injection Inject into the muscle. 0.3 mL 0  ? docusate sodium (COLACE) 100 MG capsule Take 100 mg by mouth 2 (two) times daily.    ? FARXIGA 10 MG TABS tablet TAKE 1 TABLET BY MOUTH DAILY BEFORE BREAKFAST. 90 tablet 1  ? fluticasone (FLONASE) 50 MCG/ACT nasal spray Place 2 sprays into both nostrils daily. 48 g 1  ? indapamide (LOZOL) 1.25 MG tablet TAKE 1 TABLET BY MOUTH EVERY DAY 90 tablet 0  ? KLOR-CON M20 20 MEQ tablet TAKE 1 TABLET BY MOUTH TWICE A DAY 180 tablet 0  ? levothyroxine (SYNTHROID) 112 MCG tablet TAKE 1 TABLET BY MOUTH EVERY DAY 90 tablet 1  ? metoprolol tartrate (LOPRESSOR) 25 MG tablet Take 0.5 tablets (12.5 mg total) by mouth 2 (two) times daily. 90 tablet 3  ? mometasone-formoterol (DULERA) 100-5 MCG/ACT AERO Inhale 2 puffs into the lungs 2 (two) times daily. 13 g 11  ? Multiple Vitamin (MULTIVITAMIN) tablet Take 1 tablet by mouth daily.    ? omeprazole (PRILOSEC) 40 MG capsule TAKE 1 CAPSULE (40 MG TOTAL) BY MOUTH DAILY. 90  capsule 1  ? Polyethyl Glycol-Propyl Glycol 0.4-0.3 % SOLN Apply 1 drop to eye as needed.    ? tamsulosin (FLOMAX) 0.4 MG CAPS capsule Take 1 capsule (0.4 mg total) by mouth daily after  supper. 90 capsule 1  ? valsartan (DIOVAN) 320 MG tablet Take 1 tablet (320 mg total) by mouth daily. 90 tablet 2  ? vitamin C (ASCORBIC ACID) 500 MG tablet Take 500 mg by mouth daily.    ? ?No facility-administered medications prior to visit.  ? ? ?Allergies  ?Allergen Reactions  ? Lisinopril Other (See Comments)  ?  Edema in face  ? ? ?ROS ?Pertinent positives and negatives in the history of present illness. ? ?   ?Objective:  ?  ?Physical Exam ?Constitutional:   ?   General: He is not in acute distress. ?   Appearance: He is not ill-appearing.  ?Cardiovascular:  ?   Rate and Rhythm: Normal rate.  ?   Pulses: Normal pulses.  ?Pulmonary:  ?   Effort: Pulmonary effort is normal.  ?Musculoskeletal:  ?   Left knee: Swelling present. No effusion or erythema. Tenderness present over the medial joint line and lateral joint line. Normal pulse.  ?   Comments: Bowing of the left leg with ambulation. LLE is neurovascularly intact.   ?Skin: ?   General: Skin is warm and dry.  ?   Capillary Refill: Capillary refill takes less than 2 seconds.  ?Neurological:  ?   General: No focal deficit present.  ?   Mental Status: He is alert and oriented to person, place, and time.  ?Psychiatric:     ?   Mood and Affect: Mood normal.  ? ? ?BP (!) 162/74 (BP Location: Left Arm, Patient Position: Sitting, Cuff Size: Large)   Pulse (!) 45   Temp 97.6 ?F (36.4 ?C) (Temporal)   Ht '5\' 10"'$  (1.778 m)   Wt 206 lb (93.4 kg)   SpO2 100%   BMI 29.56 kg/m?  ?Wt Readings from Last 3 Encounters:  ?03/31/22 206 lb (93.4 kg)  ?12/27/21 215 lb (97.5 kg)  ?10/28/21 218 lb (98.9 kg)  ? ? ?   ?Assessment & Plan:  ? ?Problem List Items Addressed This Visit   ?None ?Visit Diagnoses   ? ? Acute pain of left knee    -  Primary  ? Relevant Medications  ? meloxicam (MOBIC) 7.5  MG tablet  ? Other Relevant Orders  ? Ambulatory referral to Orthopedic Surgery  ? History of left knee replacement      ? Relevant Orders  ? Ambulatory referral to Orthopedic Surgery  ? ?  ? ?Severe left kne

## 2022-03-31 NOTE — Patient Instructions (Addendum)
I have referred you back to Dr. Flossie Dibble, orthopedist.  ?He is at [facility].  ?[address] [place]  ?[phone] ? ?In the meantime, rest, ice, elevate and wear the compression device you have if it helps.  ?You may take Tylenol if needed and topical diclofenac gel.  ? ?I also prescribed an anti-inflammatory medication called meloxicam for you to take daily until you see your orthopedist and get their recommendation.  ? ?Out of work until you see the orthopedist and then follow their recommendations.  ? ? ?

## 2022-04-04 DIAGNOSIS — M25562 Pain in left knee: Secondary | ICD-10-CM | POA: Diagnosis not present

## 2022-04-04 DIAGNOSIS — Z96652 Presence of left artificial knee joint: Secondary | ICD-10-CM | POA: Diagnosis not present

## 2022-04-04 DIAGNOSIS — M25462 Effusion, left knee: Secondary | ICD-10-CM | POA: Diagnosis not present

## 2022-04-04 DIAGNOSIS — M25561 Pain in right knee: Secondary | ICD-10-CM | POA: Diagnosis not present

## 2022-04-08 ENCOUNTER — Other Ambulatory Visit: Payer: Self-pay | Admitting: Orthopedic Surgery

## 2022-04-08 ENCOUNTER — Other Ambulatory Visit (HOSPITAL_COMMUNITY): Payer: Self-pay | Admitting: Orthopedic Surgery

## 2022-04-08 DIAGNOSIS — Z96652 Presence of left artificial knee joint: Secondary | ICD-10-CM

## 2022-04-08 DIAGNOSIS — M25562 Pain in left knee: Secondary | ICD-10-CM | POA: Diagnosis not present

## 2022-04-13 DIAGNOSIS — Z7984 Long term (current) use of oral hypoglycemic drugs: Secondary | ICD-10-CM | POA: Diagnosis not present

## 2022-04-13 DIAGNOSIS — E119 Type 2 diabetes mellitus without complications: Secondary | ICD-10-CM | POA: Diagnosis not present

## 2022-04-13 DIAGNOSIS — E785 Hyperlipidemia, unspecified: Secondary | ICD-10-CM | POA: Diagnosis not present

## 2022-04-13 DIAGNOSIS — I10 Essential (primary) hypertension: Secondary | ICD-10-CM | POA: Diagnosis not present

## 2022-04-13 DIAGNOSIS — E890 Postprocedural hypothyroidism: Secondary | ICD-10-CM | POA: Diagnosis not present

## 2022-04-13 DIAGNOSIS — K219 Gastro-esophageal reflux disease without esophagitis: Secondary | ICD-10-CM | POA: Diagnosis not present

## 2022-04-13 DIAGNOSIS — J45909 Unspecified asthma, uncomplicated: Secondary | ICD-10-CM | POA: Diagnosis not present

## 2022-04-13 DIAGNOSIS — M1990 Unspecified osteoarthritis, unspecified site: Secondary | ICD-10-CM | POA: Diagnosis not present

## 2022-04-13 DIAGNOSIS — N400 Enlarged prostate without lower urinary tract symptoms: Secondary | ICD-10-CM | POA: Diagnosis not present

## 2022-04-13 DIAGNOSIS — Z6830 Body mass index (BMI) 30.0-30.9, adult: Secondary | ICD-10-CM | POA: Diagnosis not present

## 2022-04-13 DIAGNOSIS — E669 Obesity, unspecified: Secondary | ICD-10-CM | POA: Diagnosis not present

## 2022-04-15 ENCOUNTER — Ambulatory Visit (INDEPENDENT_AMBULATORY_CARE_PROVIDER_SITE_OTHER): Payer: Medicare HMO

## 2022-04-15 DIAGNOSIS — I10 Essential (primary) hypertension: Secondary | ICD-10-CM

## 2022-04-15 DIAGNOSIS — E785 Hyperlipidemia, unspecified: Secondary | ICD-10-CM

## 2022-04-15 DIAGNOSIS — I119 Hypertensive heart disease without heart failure: Secondary | ICD-10-CM

## 2022-04-15 DIAGNOSIS — N401 Enlarged prostate with lower urinary tract symptoms: Secondary | ICD-10-CM

## 2022-04-15 DIAGNOSIS — N1831 Chronic kidney disease, stage 3a: Secondary | ICD-10-CM

## 2022-04-15 NOTE — Progress Notes (Signed)
Chronic Care Management Pharmacy Note  04/15/2022 Name:  Ronald Bond MRN:  [id number] DOB:  20-Oct-1946  Summary: -Pt endorses compliance with current medications, denies any issues with meds  -Reports that he has not been monitoring BP at home, with most recent office visits has been elevated - likely due to acute knee pain - rx'd meloxicam at appointment - referred to ortho  Recommendations/Changes made from today's visit: - Recommending no medication changes at this time,  patient to start BP log checking at least once weekly - to reach out should BP be elevated from goal -F/u in 6 months     Subjective: Ronald Bond is an 76 y.o. year old male who is a primary patient of Janith Lima, MD.  The CCM team was consulted for assistance with disease management and care coordination needs.    Engaged with patient by telephone for follow up visit in response to provider referral for pharmacy case management and/or care coordination services.   Consent to Services:  The patient was given the following information about Chronic Care Management services today, agreed to services, and gave verbal consent: 1. CCM service includes personalized support from designated clinical staff supervised by the primary care provider, including individualized plan of care and coordination with other care providers 2. 24/7 contact phone numbers for assistance for urgent and routine care needs. 3. Service will only be billed when office clinical staff spend 20 minutes or more in a month to coordinate care. 4. Only one practitioner may furnish and bill the service in a calendar month. 5.The patient may stop CCM services at any time (effective at the end of the month) by phone call to the office staff. 6. The patient will be responsible for cost sharing (co-pay) of up to 20% of the service fee (after annual deductible is met). Patient agreed to services and consent obtained.  Patient Care Team: Janith Lima, MD as PCP - General Ellyn Hack Leonie Green, MD as PCP - Cardiology (Cardiology) Leonie Man, MD as Consulting Physician (Cardiology) Franchot Gallo, MD as Consulting Physician (Urology) Deneise Lever, MD as Consulting Physician (Pulmonary Disease) groat (Dentistry) Delice Bison Darnelle Maffucci, [facility] as Pharmacist (Pharmacist) Glory Rosebush, MD as Consulting Physician ([facility])  Recent office visits:  03/31/2022 - Harland Dingwall NP-C - L knee pain - meloxicam rx'd - referred to ortho (previous knee replacement surgery)   Recent consult visits:  None since last visit   Hospital visits:  None in previous 6 months  Objective:  Lab Results  Component Value Date   CREATININE 1.54 (H) 09/06/2021   BUN 21 09/06/2021   GFR 43.77 (L) 09/06/2021   GFRNONAA 46 (L) 05/17/2019   GFRAA 53 (L) 05/17/2019   NA 138 09/06/2021   K 4.0 09/06/2021   CALCIUM 9.5 09/06/2021   CO2 27 09/06/2021   GLUCOSE 110 (H) 09/06/2021    Lab Results  Component Value Date/Time   HGBA1C 6.3 10/28/2021 02:33 PM   HGBA1C 6.5 06/21/2021 09:52 AM   HGBA1C 5.9 10/15/2018 09:50 AM   GFR 43.77 (L) 09/06/2021 09:12 AM   GFR 45.98 (L) 06/21/2021 09:52 AM   MICROALBUR 6.9 (H) 10/28/2021 02:33 PM   MICROALBUR 21.0 (H) 11/30/2020 10:23 AM    Last diabetic Eye exam:  Lab Results  Component Value Date/Time   HMDIABEYEEXA No Retinopathy 10/07/2021 12:00 AM    Last diabetic Foot exam:  No results found for: HMDIABFOOTEX   Lab Results  Component  Value Date   CHOL 171 11/30/2020   HDL 63.70 11/30/2020   LDLCALC 89 11/30/2020   LDLDIRECT 75.0 03/08/2016   TRIG 95.0 11/30/2020   CHOLHDL 3 11/30/2020       Latest Ref Rng & Units 11/30/2020   10:23 AM 10/15/2018   10:32 AM 12/07/2017   10:42 AM  Hepatic Function  Total Protein 6.0 - 8.3 g/dL 7.5   7.5   7.5    Albumin 3.5 - 5.2 g/dL 4.7   4.5   4.3    AST 0 - 37 U/L 15   19   11     ALT 0 - 53 U/L 17   20   13     Alk Phosphatase 39 - 117 U/L 69    59   70    Total Bilirubin 0.2 - 1.2 mg/dL 0.5   0.5   0.6    Bilirubin, Direct 0.0 - 0.3 mg/dL 0.1        Lab Results  Component Value Date/Time   TSH 3.05 10/28/2021 02:33 PM   TSH 1.25 06/21/2021 09:52 AM   FREET4 1.14 07/13/2020 09:51 AM   FREET4 1.01 01/10/2020 08:43 AM       Latest Ref Rng & Units 10/28/2021    2:33 PM 06/21/2021    9:52 AM 11/30/2020   10:23 AM  CBC  WBC 4.0 - 10.5 K/uL 8.0   7.1   7.3    Hemoglobin 13.0 - 17.0 g/dL 12.9   12.3   12.9    Hematocrit 39.0 - 52.0 % 39.4   37.0   38.5    Platelets 150.0 - 400.0 K/uL 250.0   244.0   261.0      Lab Results  Component Value Date/Time   VD25OH 34.37 10/15/2018 10:32 AM    Clinical ASCVD: No  The 10-year ASCVD risk score (Arnett DK, et al., 2019) is: 69.4%   Values used to calculate the score:     Age: 76 years     Sex: Male     Is Non-Hispanic African American: Yes     Diabetic: Yes     Tobacco smoker: Yes     Systolic Blood Pressure: 357 mmHg     Is BP treated: Yes     HDL Cholesterol: 63.7 mg/dL     Total Cholesterol: 171 mg/dL       12/24/2021    1:38 PM 06/21/2021    9:16 AM 09/18/2020   11:47 AM  Depression screen PHQ 2/9  Decreased Interest 0 0 0  Down, Depressed, Hopeless 0 0 0  PHQ - 2 Score 0 0 0     Social History   Tobacco Use  Smoking Status Never  Smokeless Tobacco Never   BP Readings from Last 3 Encounters:  03/31/22 (!) 162/74  12/27/21 (!) 150/70  10/28/21 138/76   Pulse Readings from Last 3 Encounters:  03/31/22 (!) 45  12/27/21 (!) 57  10/28/21 66   Wt Readings from Last 3 Encounters:  03/31/22 206 lb (93.4 kg)  12/27/21 215 lb (97.5 kg)  10/28/21 218 lb (98.9 kg)   BMI Readings from Last 3 Encounters:  03/31/22 29.56 kg/m  12/27/21 30.85 kg/m  10/28/21 31.28 kg/m    Assessment/Interventions: Review of patient past medical history, allergies, medications, health status, including review of consultants reports, laboratory and other test data, was performed  as part of comprehensive evaluation and provision of chronic care management services.   SDOH:  (Social  Determinants of Health) assessments and interventions performed: Yes  SDOH Screenings   Alcohol Screen: Low Risk    Last Alcohol Screening Score (AUDIT): 0  Depression (PHQ2-9): Low Risk    PHQ-2 Score: 0  Financial Resource Strain: Low Risk    Difficulty of Paying Living Expenses: Not hard at all  Food Insecurity: No Food Insecurity   Worried About Charity fundraiser in the Last Year: Never true   Ran Out of Food in the Last Year: Never true  Housing: Low Risk    Last Housing Risk Score: 0  Physical Activity: Sufficiently Active   Days of Exercise per Week: 7 days   Minutes of Exercise per Session: 30 min  Social Connections: Engineer, building services of Communication with Friends and Family: More than three times a week   Frequency of Social Gatherings with Friends and Family: Not on file   Attends Religious Services: More than 4 times per year   Active Member of [employer] or Organizations: Yes   Attends Music therapist: More than 4 times per year   Marital Status: Married  Stress: No Stress Concern Present   Feeling of Stress : Not at all  Tobacco Use: Low Risk    Smoking Tobacco Use: Never   Smokeless Tobacco Use: Never   Passive Exposure: Not on file  Transportation Needs: No Transportation Needs   Lack of Transportation (Medical): No   Lack of Transportation (Non-Medical): No    CCM Care Plan  Allergies  Allergen Reactions   Lisinopril Other (See Comments)    Edema in face    Medications Reviewed Today     Reviewed by Tomasa Blase, [facility] (Pharmacist) on 04/15/22 at [place] List Status: <None>   Medication Order Taking? Sig Documenting Provider Last Dose Status Informant  albuterol (VENTOLIN HFA) 108 (90 Base) MCG/ACT inhaler [id number] Yes Inhale 2 puffs into the lungs every 6 (six) hours as needed for wheezing or shortness of breath.  Janith Lima, MD Taking Active   amLODipine (NORVASC) 10 MG tablet [id number] Yes TAKE 1 TABLET BY MOUTH EVERY DAY Janith Lima, MD Taking Active   atorvastatin (LIPITOR) 40 MG tablet [id number] Yes TAKE 1 TABLET BY MOUTH EVERY DAY Janith Lima, MD Taking Active   Blood Pressure Monitoring (BLOOD PRESSURE MONITOR/ARM) DEVI [id number]  1 Device by Does not apply route daily. USE DAILY TO  TAKE BLOOD PRESSURE READINGS Leonie Man, MD  Active   cetirizine (ZYRTEC) 5 MG tablet [id number] Yes Take 5 mg by mouth daily. [provider] Taking Active   cholecalciferol (VITAMIN D3) 25 MCG (1000 UNIT) tablet [id number] Yes Take 1,000 Units by mouth daily. [provider] Taking Active   docusate sodium (COLACE) 100 MG capsule [id number] Yes Take 100 mg by mouth 2 (two) times daily. [provider] Taking Active   FARXIGA 10 MG TABS tablet [id number] Yes TAKE 1 TABLET BY MOUTH DAILY BEFORE BREAKFAST. Janith Lima, MD Taking Active   fluticasone [facility]) 50 MCG/ACT nasal spray [id number] Yes Place 2 sprays into both nostrils daily. Janith Lima, MD Taking Active Self  indapamide (LOZOL) 1.25 MG tablet [id number] Yes TAKE 1 TABLET BY MOUTH EVERY DAY Janith Lima, MD Taking Active   KLOR-CON M20 20 MEQ tablet [id number] Yes TAKE 1 TABLET BY MOUTH TWICE A DAY Janith Lima, MD Taking Active   levothyroxine (SYNTHROID) 112 MCG tablet [id number] Yes TAKE 1 TABLET BY MOUTH EVERY  DAY Shamleffer, Melanie Crazier, MD Taking Active   meloxicam (MOBIC) 7.5 MG tablet [id number] Yes Take 1 tablet (7.5 mg total) by mouth daily. Henson, Vickie L, NP-C Taking Active   metoprolol tartrate (LOPRESSOR) 25 MG tablet [id number]  Take 0.5 tablets (12.5 mg total) by mouth 2 (two) times daily. Lendon Colonel, NP  Expired 03/31/22 2359   mometasone-formoterol (DULERA) 100-5 MCG/ACT AERO [id number] Yes Inhale 2 puffs into the lungs 2 (two) times daily. Janith Lima, MD Taking Active    Multiple Vitamin (MULTIVITAMIN) tablet [id number] Yes Take 1 tablet by mouth daily. [provider] Taking Active Self  omeprazole (PRILOSEC) 40 MG capsule [id number] Yes TAKE 1 CAPSULE (40 MG TOTAL) BY MOUTH DAILY. Janith Lima, MD Taking Active   Polyethyl Glycol-Propyl Glycol 0.4-0.3 % SOLN [id number] Yes Apply 1 drop to eye as needed. [provider] Taking Active   tamsulosin (FLOMAX) 0.4 MG CAPS capsule [id number] Yes Take 1 capsule (0.4 mg total) by mouth daily after supper.  Patient taking differently: Take by mouth in the morning and at bedtime.   Janith Lima, MD Taking Active   valsartan (DIOVAN) 320 MG tablet [id number] Yes Take 1 tablet (320 mg total) by mouth daily. Leonie Man, MD Taking Active   vitamin C (ASCORBIC ACID) 500 MG tablet [id number] Yes Take 500 mg by mouth daily. [provider] Taking Active             Patient Active Problem List   Diagnosis Date Noted   PAC (premature atrial contraction) 12/27/2021   Encounter for general adult medical examination with abnormal findings 06/22/2021   Bradycardia 11/30/2020   Graves' orbitopathy 07/13/2020   Type II diabetes mellitus with manifestations ([place]) 05/13/2020   Graves disease 10/07/2019   Postablative hypothyroidism 10/07/2019   Thyroid-related proptosis 08/15/2019   Chronic renal disease, stage 3, moderately decreased glomerular filtration rate (GFR) between 30-59 mL/min/1.73 square meter (HCC) 08/15/2019   Long-term current use of opiate analgesic 05/14/2018   Bilateral epiphora 11/16/2017   Thiamine deficiency 06/08/2017   Obesity (BMI 30.0-34.9) 06/04/2017   Hypertensive heart disease without CHF (congestive heart failure) 12/25/2016   Right cervical radiculopathy 06/16/2016   Iron deficiency anemia 03/08/2016   Spinal stenosis in cervical region 08/25/2015   Allergic eczema 08/07/2014   Obstructive sleep apnea 11/12/2013   Mild intermittent asthma 06/03/2013    Herniated lumbar disc without myelopathy 10/18/2012   Allergic rhinitis 03/10/2011   Cardiomegaly 10/18/2010   ERECTILE DYSFUNCTION, NON-ORGANIC, MILD 06/29/2009   Hyperlipidemia associated with type 2 diabetes mellitus ([place]) 05/28/2009   Essential hypertension, benign 05/28/2009   GERD 05/28/2009   BPH associated with nocturia 05/28/2009   Primary osteoarthritis of right knee 05/28/2009    Immunization History  Administered Date(s) Administered   Fluad Quad(high Dose 65+) 07/27/2019   Influenza Split 10/24/2011   Influenza Whole 08/19/2009, 07/23/2010   Influenza, High Dose Seasonal PF 10/21/2013, 09/27/2016, 09/07/2017, 09/14/2018, 08/18/2021   Influenza,inj,Quad PF,6+ Mos 08/07/2014, 08/25/2015   Influenza-Unspecified 08/28/2020, 08/18/2021   PFIZER Comirnaty(Gray Top)Covid-19 Tri-Sucrose Vaccine 04/17/2021   PFIZER(Purple Top)SARS-COV-2 Vaccination 01/27/2020, 02/25/2020, 08/28/2020   Pfizer Covid-19 Vaccine Bivalent Booster 64yr & up 09/06/2021   Pneumococcal Conjugate-13 08/25/2015   Pneumococcal Polysaccharide-23 11/28/2004, 03/08/2016   Td 11/28/2004   Tdap 08/25/2015   Zoster Recombinat (Shingrix) 08/23/2019   Zoster, Live 02/24/2011    Conditions to be addressed/monitored:  Hypertension, Hyperlipidemia, Prediabetes, Heart Failure, GERD, Asthma, Chronic Kidney Disease, Hypothyroidism, BPH, and Allergic Rhinitis  Care Plan : CCM Care Plan  Updates made by Tomasa Blase, RPH since 04/15/2022 12:00 AM     Problem: HTN, HF, HLD, CKD, Hypothyroidism, Allergic Rhinitis, BPH, GERD, Prediabetes   Priority: High  Onset Date: 07/02/2021     Long-Range Goal: Disease Management   Start Date: 07/02/2021  Expected End Date: 01/14/2023  This Visit's Progress: On track  Recent Progress: On track  Priority: High  Note:   Current Barriers:  Unable to independently monitor therapeutic efficacy Unable to achieve control of Blood pressure   Pharmacist Clinical Goal(s):   Patient will achieve adherence to monitoring guidelines and medication adherence to achieve therapeutic efficacy achieve control of blood pressure as evidenced by blood pressure logs maintain control of LDL, asthma, hypothyroidism, CKD, and A1c as evidenced by next lab results, and asthma exacerbation frequency  through collaboration with PharmD and provider.   Interventions: 1:1 collaboration with Janith Lima, MD regarding development and update of comprehensive plan of care as evidenced by provider attestation and co-signature Inter-disciplinary care team collaboration (see longitudinal plan of care) Comprehensive medication review performed; medication list updated in electronic medical record  Hyperlipidemia: (LDL goal < 100) -Controlled Lab Results  Component Value Date   Merrill 89 11/30/2020  -Current treatment: Atorvastatin 24m - 1 tablet daily  -Medications previously tried: rosuvastatin, pravastatin   -Current dietary patterns: reports that he is reducing amount of foods high in cholesterol that he is eating, increasing lean protein intake -Current exercise habits: walking for about 30 minutes daily  -Educated on Cholesterol goals;  Benefits of statin for ASCVD risk reduction; Importance of limiting foods high in cholesterol; Exercise goal of 150 minutes per week; -Counseled on diet and exercise extensively Recommended to continue current medication  Heart Failure (Goal: manage symptoms and prevent exacerbations) / Hypertension (BP goal <140/90) -Not ideally controlled per most recent office visit readings - likely elevated due to knee pain at the time  BP Readings from Last 3 Encounters:  03/31/22 (!) 162/74  12/27/21 (!) 150/70  10/28/21 138/76  -Last ejection fraction: 47% (Date: 06/2013 - estimated via mBrantley Fling- thought to be underestimation) -NYHA Class: II (slight limitation of activity)-III -Current treatment: Indapamide 1.247m- 1 tablet daily  Valsartan  32053m 1 tablet daily  Metoprolol tartrate 62m34m1/2 tablet BID  Amlodipine 10mg8m tablet daily  Potassium Chloride 20 mEq - 1 tablet twice daily  Lab Results  Component Value Date   K 4.0 09/06/2021  -Medications previously tried: valsartan, hydrochlorothiazide, azilsartan, losartan, coreg    -Current home BP/HR readings: Reports that he has not been checking at home, confirms that he does have a BP cuff - agreeable to start checking at least once weekly, patient would like to start checking daily  -Current dietary habits: reports that he does not salt his food, tries to follow low sodium diet -Current exercise habits: walking daily for 30 minutes, plans to start using gym more often and becoming more active  -Educated on Benefits of medications for managing symptoms and prolonging life Proper diuretic administration and potassium supplementation Importance of blood pressure control -Counseled on diet and exercise extensively Recommended to continue current medications   Asthma (Goal: control symptoms and prevent exacerbations) -Controlled -Current treatment  Mometasone-Formoterol (Dulera) 100-5mcg 81m puffs twice daily prn  Albuterol 108mcg/51m- 2 puffs every 6 hours as needed - has not had to use for ~2 months -Medications previously tried: albuterol HFA inhaler  -Exacerbations requiring treatment  in last 6 months: n/a -Patient denies consistent use of maintenance inhaler -Frequency of rescue inhaler use: n/a -Counseled on Proper inhaler technique; Benefits of consistent maintenance inhaler use When to use rescue inhaler Differences between maintenance and rescue inhalers -Recommended for patient to continue current medications  Hypothyroidism (Goal: Maintenance of euthyroid levels) -Controlled Lab Results  Component Value Date   TSH 3.05 10/28/2021  -Current treatment  Levothyroxine 154mg - 1 tablet daily  -Medications previously tried: n/a  -Recommended to continue  current medication  GERD (Goal: Prevention/ control of acid reflux) -Controlled -Current treatment  Omeprazole 463m- 1 capsule daily  -Medications previously tried: Dexlansoprazole, lansoprazole  -Counseled on diet and exercise extensively Recommended for patient to restart omeprazole 4041maily due to cost of dexliant   BPH (Goal: Prevention / treatment of urinary symptoms) -Controlled -Current treatment  Tamsulosin 0.4mg34m1 capsule twice daily  -Medications previously tried: oxybutynin, dutasteride, solifenacin  -Recommended to continue current medication  Prediabetes (A1c goal <6.5%) -Controlled Lab Results  Component Value Date   HGBA1C 6.3 10/28/2021  -Current meal patterns:  breakfast: eggs with toast, yogurt, coffee   lunch: tuna salad, chicken wings, vegetables  dinner: chicken, vegetable stew / soup snacks: does not typically eat drinks: apple / cranberry juice, orange juice -Current exercise: walking for 30 minutes daily  -Educated on A1c and blood sugar goals; Complications of diabetes including kidney damage, retinal damage, and cardiovascular disease; Exercise goal of 150 minutes per week; Benefits of weight loss; -Counseled to check feet daily and get yearly eye exams -Counseled on diet and exercise extensively Recommended to continue current medication  Chronic Kidney Disease (Goal: Prevention of disease progression) -stable  -Last eGFR:43.77 mL/min -Current treatment  Farxiga 10mg23m tablet daily  -Medications previously tried: n/a  -Recommended avoidance of nephrotoxic agents, discussed importance of adequate BP and BG control to avoid kidney damage   Allergic Rhinitis (Goal: treament / control of allergies) -Controlled -Current treatment  Cetirizine 5mg -43mtablet daily  Fluticasone 50mcg/28m- 2 sprays into each nostril once daily if needed  -Medications previously tried: n/a  -Recommended to continue current medication   Health  Maintenance -Vaccine gaps: Shingles and Flu vaccine  -Current therapy:  Multivitamin - 1 tablet daily  Vitamin C 500mg - 34mblet daily  Vitamin D3 1000 units - 1 tablet daily  Docusate 100mg - 171msule twice daily  Systane Eye drops - 1 drop into each eye as needed for dry eyes  -Educated on Cost vs benefit of each product must be carefully weighed by individual consumer -Patient is satisfied with current therapy and denies issues -Recommended to continue current medication  Patient Goals/Self-Care Activities Patient will:  - take medications as prescribed check blood pressure 3 times weekly, document, and provide at future appointments target a minimum of 150 minutes of moderate intensity exercise weekly engage in dietary modifications by reducing sodium intake / moderation of carb intake / reduction of intake in foods high in cholesterol  Follow Up Plan: Telephone follow up appointment with care management team member scheduled for: 3 months The patient has been provided with contact information for the care management team and has been advised to call with any health related questions or concerns.     Medication Assistance: None required.  Patient affirms current coverage meets needs.  Patient's preferred pharmacy is:  [id number] [id number] [place] [phone] [address] [phone]  [id number]: [phone]: [phone]  Uses pill box? Yes Pt endorses 100% compliance  Care Plan and Follow Up Patient Decision:  Patient agrees to Care Plan and Follow-up.  Plan: Telephone follow up appointment with care management team member scheduled for:  3 months and The patient has been provided with contact information for the care management team and has been advised to call with any health related questions or  concerns.   DTomasa Blase PharmD Clinical Pharmacist, [place]

## 2022-04-15 NOTE — Patient Instructions (Signed)
Visit Information  Following are the goals we discussed today:   Track and Manage My Blood Pressure   Timeframe:  Long-Range Goal Priority:  High Start Date:  07/02/2021                           Expected End Date:  01/02/2023                    Follow Up Date 06/2022   - check blood pressure 3 times per week - choose a place to take my blood pressure (home, clinic or office, retail store) - write blood pressure results in a log or diary    Why is this important?   You won't feel high blood pressure, but it can still hurt your blood vessels.  High blood pressure can cause heart or kidney problems. It can also cause a stroke.  Making lifestyle changes like losing a little weight or eating less salt will help.  Checking your blood pressure at home and at different times of the day can help to control blood pressure.  If the doctor prescribes medicine remember to take it the way the doctor ordered.  Call the office if you cannot afford the medicine or if there are questions about it.   Plan: Telephone follow up appointment with care management team member scheduled for:  3 months The patient has been provided with contact information for the care management team and has been advised to call with any health related questions or concerns.   Tomasa Blase, PharmD Clinical Pharmacist, [facility]   Please call the care guide team at [phone] if you need to cancel or reschedule your appointment.   Patient verbalizes understanding of instructions and care plan provided today and agrees to view in [place]. Active MyChart status and patient understanding of how to access instructions and care plan via MyChart confirmed with patient.

## 2022-04-18 ENCOUNTER — Encounter (HOSPITAL_COMMUNITY)
Admission: RE | Admit: 2022-04-18 | Discharge: 2022-04-18 | Disposition: A | Discharge status: Home / Self Care | Payer: Medicare HMO | Source: Ambulatory Visit | Attending: Orthopedic Surgery | Admitting: Orthopedic Surgery

## 2022-04-18 DIAGNOSIS — M25561 Pain in right knee: Secondary | ICD-10-CM | POA: Diagnosis not present

## 2022-04-18 DIAGNOSIS — Z96652 Presence of left artificial knee joint: Secondary | ICD-10-CM | POA: Insufficient documentation

## 2022-04-18 DIAGNOSIS — M25562 Pain in left knee: Secondary | ICD-10-CM | POA: Diagnosis not present

## 2022-04-18 MED ORDER — TECHNETIUM TC 99M MEDRONATE IV KIT
20.0000 | PACK | Freq: Once | INTRAVENOUS | Status: AC | PRN
  Administered 2022-04-18: 20 via INTRAVENOUS

## 2022-04-22 DIAGNOSIS — M25562 Pain in left knee: Secondary | ICD-10-CM | POA: Diagnosis not present

## 2022-04-26 ENCOUNTER — Telehealth: Payer: Self-pay | Admitting: *Deleted

## 2022-04-26 ENCOUNTER — Encounter: Payer: Self-pay | Admitting: Cardiology

## 2022-04-26 ENCOUNTER — Encounter: Payer: Self-pay | Admitting: Internal Medicine

## 2022-04-26 NOTE — Telephone Encounter (Signed)
Left message for the pt to call back to schedule a tele pre op appt 

## 2022-04-26 NOTE — Telephone Encounter (Signed)
   Pre-operative Risk Assessment    Patient Name: Ronald Bond  DOB: 08-02-1946 MRN: [id number]      Request for Surgical Clearance    Procedure:   RIGHT TOTAL KNEE REVISION ARTHROPLASTY  Date of Surgery:  Clearance TBD                                 Surgeon:  DR. DANIEL MARCHWIANY Surgeon's Group or Practice Name:  [facility] Phone number:  [phone] EXT [address] Fax number:  [phone]   Type of Clearance Requested:   - Medical ; NO MEDICATIONS BEING REQUESTED TO BE HELD   Type of Anesthesia:  Spinal   Additional requests/questions:    Jiles Prows   04/26/2022, 10:49 AM

## 2022-04-27 ENCOUNTER — Other Ambulatory Visit: Payer: Self-pay | Admitting: Family Medicine

## 2022-04-27 DIAGNOSIS — N1831 Chronic kidney disease, stage 3a: Secondary | ICD-10-CM | POA: Diagnosis not present

## 2022-04-27 DIAGNOSIS — Z7984 Long term (current) use of oral hypoglycemic drugs: Secondary | ICD-10-CM | POA: Diagnosis not present

## 2022-04-27 DIAGNOSIS — E785 Hyperlipidemia, unspecified: Secondary | ICD-10-CM

## 2022-04-27 DIAGNOSIS — N400 Enlarged prostate without lower urinary tract symptoms: Secondary | ICD-10-CM | POA: Diagnosis not present

## 2022-04-27 DIAGNOSIS — I130 Hypertensive heart and chronic kidney disease with heart failure and stage 1 through stage 4 chronic kidney disease, or unspecified chronic kidney disease: Secondary | ICD-10-CM

## 2022-04-27 DIAGNOSIS — I509 Heart failure, unspecified: Secondary | ICD-10-CM

## 2022-04-27 DIAGNOSIS — M25562 Pain in left knee: Secondary | ICD-10-CM

## 2022-04-28 NOTE — Telephone Encounter (Signed)
Left message x 2 call the office to schedule a tele pre op appt.

## 2022-04-29 NOTE — Telephone Encounter (Signed)
See mychart messages

## 2022-04-29 NOTE — Telephone Encounter (Signed)
Left message for the pt x 3 to call for tele pre op appt. I will send letter to pt to call the office. Will update the requesting office pt will need an appt though we have been unable to reach the pt.   Will remove from the pre op call back pool at this time. Will re-address once the pt calls back for appt.

## 2022-05-02 ENCOUNTER — Other Ambulatory Visit: Payer: Self-pay | Admitting: Internal Medicine

## 2022-05-02 ENCOUNTER — Telehealth: Payer: Self-pay | Admitting: *Deleted

## 2022-05-02 NOTE — Telephone Encounter (Signed)
    Pt agreeable to plan of care for tele pre op appt 05/03/22 @ 3:40 per pt request. Med rec and consent are done.

## 2022-05-02 NOTE — Telephone Encounter (Signed)
Pt agreeable to plan of care for tele pre op appt 05/03/22 @ 3:40 per pt request. Med rec and consent are done.     Patient Consent for Virtual Visit        Ronald Bond has provided verbal consent on 05/02/2022 for a virtual visit (video or telephone).   CONSENT FOR VIRTUAL VISIT FOR:  Ronald Bond  By participating in this virtual visit I agree to the following:  I hereby voluntarily request, consent and authorize [facility] and its employed or contracted physicians, physician assistants, nurse practitioners or other licensed health care professionals (the Practitioner), to provide me with telemedicine health care services (the "Services") as deemed necessary by the treating Practitioner. I acknowledge and consent to receive the Services by the Practitioner via telemedicine. I understand that the telemedicine visit will involve communicating with the Practitioner through live audiovisual communication technology and the disclosure of certain medical information by electronic transmission. I acknowledge that I have been given the opportunity to request an in-person assessment or other available alternative prior to the telemedicine visit and am voluntarily participating in the telemedicine visit.  I understand that I have the right to withhold or withdraw my consent to the use of telemedicine in the course of my care at any time, without affecting my right to future care or treatment, and that the Practitioner or I may terminate the telemedicine visit at any time. I understand that I have the right to inspect all information obtained and/or recorded in the course of the telemedicine visit and may receive copies of available information for a reasonable fee.  I understand that some of the potential risks of receiving the Services via telemedicine include:  Delay or interruption in medical evaluation due to technological equipment failure or disruption; Information transmitted may not be  sufficient (e.g. poor resolution of images) to allow for appropriate medical decision making by the Practitioner; and/or  In rare instances, security protocols could fail, causing a breach of personal health information.  Furthermore, I acknowledge that it is my responsibility to provide information about my medical history, conditions and care that is complete and accurate to the best of my ability. I acknowledge that Practitioner's advice, recommendations, and/or decision may be based on factors not within their control, such as incomplete or inaccurate data provided by me or distortions of diagnostic images or specimens that may result from electronic transmissions. I understand that the practice of medicine is not an exact science and that Practitioner makes no warranties or guarantees regarding treatment outcomes. I acknowledge that a copy of this consent can be made available to me via my patient portal ([name]), or I can request a printed copy by calling the office of [facility].    I understand that my insurance will be billed for this visit.   I have read or had this consent read to me. I understand the contents of this consent, which adequately explains the benefits and risks of the Services being provided via telemedicine.  I have been provided ample opportunity to ask questions regarding this consent and the Services and have had my questions answered to my satisfaction. I give my informed consent for the services to be provided through the use of telemedicine in my medical care

## 2022-05-03 ENCOUNTER — Ambulatory Visit (INDEPENDENT_AMBULATORY_CARE_PROVIDER_SITE_OTHER): Payer: Medicare HMO | Admitting: Physician Assistant

## 2022-05-03 DIAGNOSIS — I119 Hypertensive heart disease without heart failure: Secondary | ICD-10-CM | POA: Diagnosis not present

## 2022-05-03 DIAGNOSIS — Z01810 Encounter for preprocedural cardiovascular examination: Secondary | ICD-10-CM | POA: Diagnosis not present

## 2022-05-03 NOTE — Progress Notes (Signed)
Virtual Visit via Telephone Note   Because of GID SCHOFFSTALL co-morbid illnesses, he is at least at moderate risk for complications without adequate follow up.  This format is felt to be most appropriate for this patient at this time.  The patient did not have access to video technology/had technical difficulties with video requiring transitioning to audio format only (telephone).  All issues noted in this document were discussed and addressed.  No physical exam could be performed with this format.  Please refer to the patient's chart for his consent to telehealth for [facility].  Evaluation Performed:  Preoperative cardiovascular risk assessment _____________   Date:  05/03/2022   Patient ID:  Ronald Bond, DOB [birth year], MRN [id number] Patient Location:  Home Provider location:   Office  Primary Care Provider:  Janith Lima, MD Primary Cardiologist:  Ronald Hew, MD  Chief Complaint / Patient Profile   76 y.o. y/o male with a h/o HTN/Hypertensive Heart Disease (without CHF), OSA (not on regular CPAP, bradycardia, obesity and PAC who is pending RIGHT TOTAL KNEE REVISION ARTHROPLASTY and presents today for telephonic preoperative cardiovascular risk assessment.  Past Medical History    Past Medical History:  Diagnosis Date   GERD (gastroesophageal reflux disease)    HTN (hypertension)    Hyperlipidemia    Hypothyroidism    Osteoarthritis    Wears glasses    Past Surgical History:  Procedure Laterality Date   COLONOSCOPY     EYE SURGERY     both cataracts   MICROLARYNGOSCOPY Left 11/18/2013   Procedure: MICROLARYNGOSCOPY WITH REMOVAL OF GRANULOMA LEFT SIDE;  Surgeon: Ronald Gala, MD;  Location: [facility];  Service: ENT;  Laterality: Left;   NM MYOVIEW LTD  06/2007   Low risk, normal.  No evidence of ischemia or infarction   THYROIDECTOMY  1974   TONSILLECTOMY     TOTAL KNEE ARTHROPLASTY  2009   left    Allergies  Allergies  Allergen  Reactions   Lisinopril Other (See Comments)    Edema in face    History of Present Illness    Ronald Bond is a 76 y.o. male who presents via audio/video conferencing for a telehealth visit today.  Pt was last seen in cardiology clinic on 12/27/21 by Dr. Ellyn Bond.  At that time Ronald [facility] was doing well.  The patient is now pending procedure as outlined above. Since his last visit, he well.  Limited activity for past 2 months due to ongoing knee pain.  Prior to this, he was walking without any problem.  He denies chest pain, shortness of breath, dizziness, palpitation, orthopnea, PND, syncope, lower extremity edema or melena.  He is easily getting greater than 4 METS of activity currently.  Home Medications    Prior to Admission medications   Medication Sig Start Date End Date Taking? Authorizing Provider  albuterol (VENTOLIN HFA) 108 (90 Base) MCG/ACT inhaler Inhale 2 puffs into the lungs every 6 (six) hours as needed for wheezing or shortness of breath. 07/05/21   Ronald Lima, MD  amLODipine (NORVASC) 10 MG tablet TAKE 1 TABLET BY MOUTH EVERY DAY 12/01/21   Ronald Lima, MD  atorvastatin (LIPITOR) 40 MG tablet TAKE 1 TABLET BY MOUTH EVERY DAY 12/27/21   Ronald Lima, MD  Blood Pressure Monitoring (BLOOD PRESSURE MONITOR/ARM) DEVI 1 Device by Does not apply route daily. USE DAILY TO  TAKE BLOOD PRESSURE READINGS 12/27/21   Ronald Man, MD  cetirizine (ZYRTEC) 5 MG tablet Take 5 mg by mouth daily.    [provider]  cholecalciferol (VITAMIN D3) 25 MCG (1000 UNIT) tablet Take 1,000 Units by mouth daily.    [provider]  docusate sodium (COLACE) 100 MG capsule Take 100 mg by mouth 2 (two) times daily.    [provider]  FARXIGA 10 MG TABS tablet TAKE 1 TABLET BY MOUTH DAILY BEFORE BREAKFAST. 12/13/21   Ronald Lima, MD  fluticasone (FLONASE) 50 MCG/ACT nasal spray Place 2 sprays into both nostrils daily. Patient taking differently: Place 2 sprays  into both nostrils daily as needed. 10/15/18   Ronald Lima, MD  indapamide (LOZOL) 1.25 MG tablet TAKE 1 TABLET BY MOUTH EVERY DAY 03/24/22   Ronald Lima, MD  KLOR-CON M20 20 MEQ tablet TAKE 1 TABLET BY MOUTH TWICE A DAY 01/02/22   Ronald Lima, MD  levothyroxine (SYNTHROID) 112 MCG tablet TAKE 1 TABLET BY MOUTH EVERY DAY 01/03/22   Shamleffer, Melanie Crazier, MD  meloxicam (MOBIC) 7.5 MG tablet TAKE 1 TABLET BY MOUTH EVERY DAY 04/28/22   Ronald Lima, MD  metoprolol tartrate (LOPRESSOR) 25 MG tablet Take 0.5 tablets (12.5 mg total) by mouth 2 (two) times daily. 12/29/21 05/02/22  Ronald Colonel, NP  mometasone-formoterol (DULERA) 100-5 MCG/ACT AERO Inhale 2 puffs into the lungs 2 (two) times daily. Patient not taking: Reported on 05/02/2022 03/08/16   Ronald Lima, MD  Multiple Vitamin (MULTIVITAMIN) tablet Take 1 tablet by mouth daily.    [provider]  omeprazole (PRILOSEC) 40 MG capsule TAKE 1 CAPSULE (40 MG TOTAL) BY MOUTH DAILY. 12/27/21   Ronald Lima, MD  Polyethyl Glycol-Propyl Glycol 0.4-0.3 % SOLN Apply 1 drop to eye as needed.    [provider]  tamsulosin (FLOMAX) 0.4 MG CAPS capsule Take 1 capsule (0.4 mg total) by mouth daily after supper. Patient taking differently: Take by mouth in the morning and at bedtime. 12/25/21   Ronald Lima, MD  valsartan (DIOVAN) 320 MG tablet Take 1 tablet (320 mg total) by mouth daily. 12/27/21   Ronald Man, MD  vitamin C (ASCORBIC ACID) 500 MG tablet Take 500 mg by mouth daily.    [provider]    Physical Exam    Vital Signs:  Ronald Bond does not have vital signs available for review today.  Given telephonic nature of communication, physical exam is limited. AAOx3. NAD. Normal affect.  Speech and respirations are unlabored.  Accessory Clinical Findings    None  Assessment & Plan    1.  Preoperative Cardiovascular Risk Assessment: Given past medical history and time since last visit,  based on ACC/AHA guidelines, Ronald Bond would be at acceptable risk for the planned procedure without further cardiovascular testing.   The patient was advised that if he develops new symptoms prior to surgery to contact our office to arrange for a follow-up visit, and he verbalized understanding.  I will route this recommendation to the requesting party via Epic fax function and remove from pre-op pool.  Please call with questions.  A copy of this note will be routed to requesting surgeon.  Time:   Today, I have spent 7 minutes with the patient with telehealth technology discussing medical history, symptoms, and management plan.     [place], [place]  05/03/2022, 3:34 PM

## 2022-05-14 ENCOUNTER — Other Ambulatory Visit: Payer: Self-pay | Admitting: Internal Medicine

## 2022-05-14 DIAGNOSIS — E876 Hypokalemia: Secondary | ICD-10-CM

## 2022-05-14 DIAGNOSIS — I10 Essential (primary) hypertension: Secondary | ICD-10-CM

## 2022-05-25 ENCOUNTER — Other Ambulatory Visit: Payer: Self-pay | Admitting: Internal Medicine

## 2022-05-25 DIAGNOSIS — M25562 Pain in left knee: Secondary | ICD-10-CM

## 2022-06-06 ENCOUNTER — Encounter: Payer: Self-pay | Admitting: Internal Medicine

## 2022-06-06 ENCOUNTER — Ambulatory Visit (INDEPENDENT_AMBULATORY_CARE_PROVIDER_SITE_OTHER): Payer: Medicare HMO | Admitting: Internal Medicine

## 2022-06-06 VITALS — BP 172/78 | HR 50 | Temp 98.2°F | Resp 16 | Ht 70.0 in | Wt 208.0 lb

## 2022-06-06 DIAGNOSIS — D508 Other iron deficiency anemias: Secondary | ICD-10-CM

## 2022-06-06 DIAGNOSIS — K2100 Gastro-esophageal reflux disease with esophagitis, without bleeding: Secondary | ICD-10-CM

## 2022-06-06 DIAGNOSIS — E519 Thiamine deficiency, unspecified: Secondary | ICD-10-CM

## 2022-06-06 DIAGNOSIS — E890 Postprocedural hypothyroidism: Secondary | ICD-10-CM

## 2022-06-06 DIAGNOSIS — E1169 Type 2 diabetes mellitus with other specified complication: Secondary | ICD-10-CM | POA: Diagnosis not present

## 2022-06-06 DIAGNOSIS — E118 Type 2 diabetes mellitus with unspecified complications: Secondary | ICD-10-CM

## 2022-06-06 DIAGNOSIS — E785 Hyperlipidemia, unspecified: Secondary | ICD-10-CM

## 2022-06-06 DIAGNOSIS — I10 Essential (primary) hypertension: Secondary | ICD-10-CM

## 2022-06-06 DIAGNOSIS — I119 Hypertensive heart disease without heart failure: Secondary | ICD-10-CM | POA: Diagnosis not present

## 2022-06-06 DIAGNOSIS — N401 Enlarged prostate with lower urinary tract symptoms: Secondary | ICD-10-CM

## 2022-06-06 DIAGNOSIS — N1832 Chronic kidney disease, stage 3b: Secondary | ICD-10-CM

## 2022-06-06 LAB — LIPID PANEL
Cholesterol: 159 mg/dL (ref 0–200)
HDL: 56.6 mg/dL (ref >39.00)
LDL Cholesterol: 84 mg/dL (ref 0–99)
NonHDL: 102.27
Total CHOL/HDL Ratio: 3
Triglycerides: 90 mg/dL (ref 0.0–149.0)
VLDL: 18 mg/dL (ref 0.0–40.0)

## 2022-06-06 LAB — URINALYSIS, ROUTINE W REFLEX MICROSCOPIC
Bilirubin Urine: NEGATIVE
Hgb urine dipstick: NEGATIVE
Ketones, ur: NEGATIVE
Leukocytes,Ua: NEGATIVE
Nitrite: NEGATIVE
RBC / HPF: NONE SEEN (ref 0–?)
Specific Gravity, Urine: 1.015 (ref 1.000–1.030)
Urine Glucose: >=1000 — AB
Urobilinogen, UA: 0.2 (ref 0.0–1.0)
pH: 6 (ref 5.0–8.0)

## 2022-06-06 LAB — HEPATIC FUNCTION PANEL
ALT: 17 U/L (ref 0–53)
AST: 20 U/L (ref 0–37)
Albumin: 4.6 g/dL (ref 3.5–5.2)
Alkaline Phosphatase: 70 U/L (ref 39–117)
Bilirubin, Direct: 0.1 mg/dL (ref 0.0–0.3)
Total Bilirubin: 0.4 mg/dL (ref 0.2–1.2)
Total Protein: 7.8 g/dL (ref 6.0–8.3)

## 2022-06-06 LAB — IBC + FERRITIN
Ferritin: 35.6 ng/mL (ref 22.0–322.0)
Iron: 45 ug/dL (ref 42–165)
Saturation Ratios: 10.8 % — ABNORMAL LOW (ref 20.0–50.0)
TIBC: 415.8 ug/dL (ref 250.0–450.0)
Transferrin: 297 mg/dL (ref 212.0–360.0)

## 2022-06-06 LAB — BASIC METABOLIC PANEL
BUN: 23 mg/dL (ref 6–23)
CO2: 28 mEq/L (ref 19–32)
Calcium: 9.7 mg/dL (ref 8.4–10.5)
Chloride: 101 mEq/L (ref 96–112)
Creatinine, Ser: 1.65 mg/dL — ABNORMAL HIGH (ref 0.40–1.50)
GFR: 40.08 mL/min — ABNORMAL LOW (ref >60.00)
Glucose, Bld: 100 mg/dL — ABNORMAL HIGH (ref 70–99)
Potassium: 3.7 mEq/L (ref 3.5–5.1)
Sodium: 138 mEq/L (ref 135–145)

## 2022-06-06 LAB — CBC WITH DIFFERENTIAL/PLATELET
Basophils Absolute: 0.1 10*3/uL (ref 0.0–0.1)
Basophils Relative: 0.9 % (ref 0.0–3.0)
Eosinophils Absolute: 0.3 10*3/uL (ref 0.0–0.7)
Eosinophils Relative: 4.8 % (ref 0.0–5.0)
HCT: 41.4 % (ref 39.0–52.0)
Hemoglobin: 13.5 g/dL (ref 13.0–17.0)
Lymphocytes Relative: 18.1 % (ref 12.0–46.0)
Lymphs Abs: 1.3 10*3/uL (ref 0.7–4.0)
MCHC: 32.7 g/dL (ref 30.0–36.0)
MCV: 90.3 fl (ref 78.0–100.0)
Monocytes Absolute: 0.6 10*3/uL (ref 0.1–1.0)
Monocytes Relative: 8.5 % (ref 3.0–12.0)
Neutro Abs: 4.8 10*3/uL (ref 1.4–7.7)
Neutrophils Relative %: 67.7 % (ref 43.0–77.0)
Platelets: 239 10*3/uL (ref 150.0–400.0)
RBC: 4.58 Mil/uL (ref 4.22–5.81)
RDW: 14.4 % (ref 11.5–15.5)
WBC: 7.1 10*3/uL (ref 4.0–10.5)

## 2022-06-06 LAB — HEMOGLOBIN A1C: Hgb A1c MFr Bld: 6.3 % (ref 4.6–6.5)

## 2022-06-06 LAB — TSH: TSH: 0.83 u[IU]/mL (ref 0.35–5.50)

## 2022-06-06 MED ORDER — AMLODIPINE BESYLATE 10 MG PO TABS: 10.0000 mg | ORAL_TABLET | Freq: Every day | ORAL | 1 refills | Status: DC

## 2022-06-06 MED ORDER — HYDRALAZINE HCL 25 MG PO TABS: 25.0000 mg | ORAL_TABLET | Freq: Three times a day (TID) | ORAL | 0 refills | Status: DC

## 2022-06-06 NOTE — Patient Instructions (Signed)
Health Maintenance, Male Adopting a healthy lifestyle and getting preventive care are important in promoting health and wellness. Ask your health care provider about: The right schedule for you to have regular tests and exams. Things you can do on your own to prevent diseases and keep yourself healthy. What should I know about diet, weight, and exercise? Eat a healthy diet  Eat a diet that includes plenty of vegetables, fruits, low-fat dairy products, and lean protein. Do not eat a lot of foods that are high in solid fats, added sugars, or sodium. Maintain a healthy weight Body mass index (BMI) is a measurement that can be used to identify possible weight problems. It estimates body fat based on height and weight. Your health care provider can help determine your BMI and help you achieve or maintain a healthy weight. Get regular exercise Get regular exercise. This is one of the most important things you can do for your health. Most adults should: Exercise for at least 150 minutes each week. The exercise should increase your heart rate and make you sweat (moderate-intensity exercise). Do strengthening exercises at least twice a week. This is in addition to the moderate-intensity exercise. Spend less time sitting. Even light physical activity can be beneficial. Watch cholesterol and blood lipids Have your blood tested for lipids and cholesterol at 76 years of age, then have this test every 5 years. You may need to have your cholesterol levels checked more often if: Your lipid or cholesterol levels are high. You are older than 76 years of age. You are at high risk for heart disease. What should I know about cancer screening? Many types of cancers can be detected early and may often be prevented. Depending on your health history and family history, you may need to have cancer screening at various ages. This may include screening for: Colorectal cancer. Prostate cancer. Skin cancer. Lung  cancer. What should I know about heart disease, diabetes, and high blood pressure? Blood pressure and heart disease High blood pressure causes heart disease and increases the risk of stroke. This is more likely to develop in people who have high blood pressure readings or are overweight. Talk with your health care provider about your target blood pressure readings. Have your blood pressure checked: Every 3-5 years if you are 18-39 years of age. Every year if you are 40 years old or older. If you are between the ages of 65 and 75 and are a current or former smoker, ask your health care provider if you should have a one-time screening for abdominal aortic aneurysm (AAA). Diabetes Have regular diabetes screenings. This checks your fasting blood sugar level. Have the screening done: Once every three years after age 45 if you are at a normal weight and have a low risk for diabetes. More often and at a younger age if you are overweight or have a high risk for diabetes. What should I know about preventing infection? Hepatitis B If you have a higher risk for hepatitis B, you should be screened for this virus. Talk with your health care provider to find out if you are at risk for hepatitis B infection. Hepatitis C Blood testing is recommended for: Everyone born from 1945 through 1965. Anyone with known risk factors for hepatitis C. Sexually transmitted infections (STIs) You should be screened each year for STIs, including gonorrhea and chlamydia, if: You are sexually active and are younger than 76 years of age. You are older than 76 years of age and your   health care provider tells you that you are at risk for this type of infection. Your sexual activity has changed since you were last screened, and you are at increased risk for chlamydia or gonorrhea. Ask your health care provider if you are at risk. Ask your health care provider about whether you are at high risk for HIV. Your health care provider  may recommend a prescription medicine to help prevent HIV infection. If you choose to take medicine to prevent HIV, you should first get tested for HIV. You should then be tested every 3 months for as long as you are taking the medicine. Follow these instructions at home: Alcohol use Do not drink alcohol if your health care provider tells you not to drink. If you drink alcohol: Limit how much you have to 0-2 drinks a day. Know how much alcohol is in your drink. In the [place] one drink equals one 12 oz bottle of beer (355 mL), one 5 oz glass of wine (148 mL), or one 1 oz glass of hard liquor (44 mL). Lifestyle Do not use any products that contain nicotine or tobacco. These products include cigarettes, chewing tobacco, and vaping devices, such as e-cigarettes. If you need help quitting, ask your health care provider. Do not use street drugs. Do not share needles. Ask your health care provider for help if you need support or information about quitting drugs. General instructions Schedule regular health, dental, and eye exams. Stay current with your vaccines. Tell your health care provider if: You often feel depressed. You have ever been abused or do not feel safe at home. Summary Adopting a healthy lifestyle and getting preventive care are important in promoting health and wellness. Follow your health care provider's instructions about healthy diet, exercising, and getting tested or screened for diseases. Follow your health care provider's instructions on monitoring your cholesterol and blood pressure. This information is not intended to replace advice given to you by your health care provider. Make sure you discuss any questions you have with your health care provider. Document Revised: 04/05/2021 Document Reviewed: 04/05/2021 Elsevier Patient Education  2023 [employer]  

## 2022-06-06 NOTE — Progress Notes (Unsigned)
Subjective:  Patient ID: Ronald Bond, male    DOB: 10-28-1946  Age: 76 y.o. MRN: [id number]  CC: Annual Exam, Hypertension, Hyperlipidemia, Diabetes, Hypothyroidism, and Anemia   HPI [name] presents for a CPX and f/up -   Outpatient Medications Prior to Visit  Medication Sig Dispense Refill   albuterol (VENTOLIN HFA) 108 (90 Base) MCG/ACT inhaler Inhale 2 puffs into the lungs every 6 (six) hours as needed for wheezing or shortness of breath. 18 g 3   atorvastatin (LIPITOR) 40 MG tablet TAKE 1 TABLET BY MOUTH EVERY DAY 90 tablet 1   Blood Pressure Monitoring (BLOOD PRESSURE MONITOR/ARM) DEVI 1 Device by Does not apply route daily. USE DAILY TO  TAKE BLOOD PRESSURE READINGS 1 each 0   cholecalciferol (VITAMIN D3) 25 MCG (1000 UNIT) tablet Take 1,000 Units by mouth daily.     docusate sodium (COLACE) 100 MG capsule Take 100 mg by mouth 2 (two) times daily.     fluticasone (FLONASE) 50 MCG/ACT nasal spray Place 2 sprays into both nostrils daily. (Patient taking differently: Place 2 sprays into both nostrils daily as needed.) 48 g 1   indapamide (LOZOL) 1.25 MG tablet TAKE 1 TABLET BY MOUTH EVERY DAY 90 tablet 0   KLOR-CON M20 20 MEQ tablet TAKE 1 TABLET BY MOUTH TWICE A DAY 180 tablet 0   levothyroxine (SYNTHROID) 112 MCG tablet TAKE 1 TABLET BY MOUTH EVERY DAY 90 tablet 1   Multiple Vitamin (MULTIVITAMIN) tablet Take 1 tablet by mouth daily.     omeprazole (PRILOSEC) 40 MG capsule TAKE 1 CAPSULE (40 MG TOTAL) BY MOUTH DAILY. 90 capsule 1   Polyethyl Glycol-Propyl Glycol 0.4-0.3 % SOLN Place 1 drop into both eyes as needed (dry eyes).     tamsulosin (FLOMAX) 0.4 MG CAPS capsule Take 1 capsule (0.4 mg total) by mouth daily after supper. (Patient taking differently: Take 0.4 mg by mouth 2 (two) times daily.) 90 capsule 1   valsartan (DIOVAN) 320 MG tablet Take 1 tablet (320 mg total) by mouth daily. 90 tablet 2   vitamin C (ASCORBIC ACID) 500 MG tablet Take 500 mg by mouth daily.      amLODipine (NORVASC) 10 MG tablet TAKE 1 TABLET BY MOUTH EVERY DAY 90 tablet 1   cetirizine (ZYRTEC) 5 MG tablet Take 5 mg by mouth daily.     FARXIGA 10 MG TABS tablet TAKE 1 TABLET BY MOUTH DAILY BEFORE BREAKFAST. 90 tablet 1   meloxicam (MOBIC) 7.5 MG tablet TAKE 1 TABLET BY MOUTH EVERY DAY 30 tablet 0   mometasone-formoterol (DULERA) 100-5 MCG/ACT AERO Inhale 2 puffs into the lungs 2 (two) times daily. 13 g 11   metoprolol tartrate (LOPRESSOR) 25 MG tablet Take 0.5 tablets (12.5 mg total) by mouth 2 (two) times daily. 90 tablet 3   No facility-administered medications prior to visit.    ROS Review of Systems  Objective:  BP (!) 172/78 (BP Location: Right Arm, Patient Position: Sitting, Cuff Size: Large)   Pulse (!) 50   Temp 98.2 F (36.8 C) (Oral)   Resp 16   Ht '5\' 10"'$  (1.778 m)   Wt 208 lb (94.3 kg)   SpO2 98%   BMI 29.84 kg/m   BP Readings from Last 3 Encounters:  06/06/22 (!) 172/78  03/31/22 (!) 162/74  12/27/21 (!) 150/70    Wt Readings from Last 3 Encounters:  06/06/22 208 lb (94.3 kg)  03/31/22 206 lb (93.4 kg)  12/27/21 215 lb (  97.5 kg)    Physical Exam Cardiovascular:     Rate and Rhythm: Bradycardia present. Occasional Extrasystoles are present.    Comments: EKG- SB with PAC, 52 bpm LAD unchanged Musculoskeletal:     Right lower leg: No edema.     Left lower leg: No edema.     Lab Results  Component Value Date   WBC 7.1 06/06/2022   HGB 13.5 06/06/2022   HCT 41.4 06/06/2022   PLT 239.0 06/06/2022   GLUCOSE 100 (H) 06/06/2022   CHOL 159 06/06/2022   TRIG 90.0 06/06/2022   HDL 56.60 06/06/2022   LDLDIRECT 75.0 03/08/2016   LDLCALC 84 06/06/2022   ALT 17 06/06/2022   AST 20 06/06/2022   NA 138 06/06/2022   K 3.7 06/06/2022   CL 101 06/06/2022   CREATININE 1.65 (H) 06/06/2022   BUN 23 06/06/2022   CO2 28 06/06/2022   TSH 0.83 06/06/2022   PSA 3.26 11/30/2020   INR 0.9 10/17/2007   HGBA1C 6.3 06/06/2022   MICROALBUR 6.9 (H)  10/28/2021    NM Bone Scan 3 Phase  Result Date: 04/18/2022 CLINICAL DATA:  LEFT total knee replacement 2006. LEFT knee pain. RIGHT knee pain. EXAM: NUCLEAR MEDICINE 3-PHASE BONE SCAN TECHNIQUE: Radionuclide angiographic images, immediate static blood pool images, and 3-hour delayed static images were obtained of the knees after intravenous injection of radiopharmaceutical. RADIOPHARMACEUTICALS:  20.5 mCi Tc-59mMDP IV COMPARISON:  None Available. FINDINGS: Vascular phase: Mild increased blood flow to the LEFT knee compared to the RIGHT Blood pool phase: Mild increased blood pool localizing to the LEFT lateral femoral condyle as well as the LEFT medial femoral condyle. Mild activity in the LEFT medial tibial plateau. Delayed phase: Delayed bone phase activity localizing to the medial LEFT tibial plateau. Fairly intense uptake in the medial compartment of the RIGHT knee. IMPRESSION: 1. Delayed phase activity localizing to the medial LEFT tibial plateau. Mild hyperemia to the LEFT knee joint. Findings could represent early loosening. 2. Focal uptake in the medial compartment of the RIGHT knee consistent with degenerative joint disease. Electronically Signed   By: SSuzy BouchardM.D.   On: 04/18/2022 17:32    Assessment & Plan:   JEllerywas seen today for annual exam, hypertension, hyperlipidemia, diabetes, hypothyroidism and anemia.  Diagnoses and all orders for this visit:  Essential hypertension, benign -     Basic metabolic panel; Future -     Aldosterone + renin activity w/ ratio; Future -     EKG 12-Lead -     hydrALAZINE (APRESOLINE) 25 MG tablet; Take 1 tablet (25 mg total) by mouth 3 (three) times daily. -     amLODipine (NORVASC) 10 MG tablet; Take 1 tablet (10 mg total) by mouth daily. -     Aldosterone + renin activity w/ ratio -     Basic metabolic panel  Hypertensive heart disease without CHF (congestive heart failure) -     Basic metabolic panel; Future -     Urinalysis, Routine  w reflex microscopic; Future -     Aldosterone + renin activity w/ ratio; Future -     hydrALAZINE (APRESOLINE) 25 MG tablet; Take 1 tablet (25 mg total) by mouth 3 (three) times daily. -     amLODipine (NORVASC) 10 MG tablet; Take 1 tablet (10 mg total) by mouth daily. -     Aldosterone + renin activity w/ ratio -     Urinalysis, Routine w reflex microscopic -  Basic metabolic panel  Gastroesophageal reflux disease with esophagitis without hemorrhage -     CBC with Differential/Platelet; Future -     CBC with Differential/Platelet  Postablative hypothyroidism -     TSH; Future -     TSH  Type II diabetes mellitus with manifestations (HCC) -     Hemoglobin A1c; Future -     Hemoglobin A1c  Hyperlipidemia associated with type 2 diabetes mellitus ([place]) -     Lipid panel; Future -     Hepatic function panel; Future -     Hepatic function panel -     Lipid panel  Stage 3b chronic kidney disease (HCC) -     Basic metabolic panel; Future -     Urinalysis, Routine w reflex microscopic; Future -     Urinalysis, Routine w reflex microscopic -     Basic metabolic panel  Thiamine deficiency -     CBC with Differential/Platelet; Future -     Vitamin B1; Future -     Vitamin B1 -     CBC with Differential/Platelet  BPH associated with nocturia  Iron deficiency anemia secondary to inadequate dietary iron intake -     IBC + Ferritin; Future -     CBC with Differential/Platelet; Future -     CBC with Differential/Platelet -     IBC + Ferritin   I have discontinued [name] "[place]"'s meloxicam. I have also changed his amLODipine. Additionally, I am having him start on hydrALAZINE. Lastly, I am having him maintain his multivitamin, fluticasone, Polyethyl Glycol-Propyl Glycol, vitamin C, cholecalciferol, docusate sodium, albuterol, tamsulosin, omeprazole, atorvastatin, valsartan, Blood Pressure Monitor/Arm, metoprolol tartrate, levothyroxine, indapamide, and Klor-Con  M20.  Meds ordered this encounter  Medications   hydrALAZINE (APRESOLINE) 25 MG tablet    Sig: Take 1 tablet (25 mg total) by mouth 3 (three) times daily.    Dispense:  270 tablet    Refill:  0   amLODipine (NORVASC) 10 MG tablet    Sig: Take 1 tablet (10 mg total) by mouth daily.    Dispense:  90 tablet    Refill:  1     Follow-up: Return in about 3 months (around 09/06/2022).  Scarlette Calico, MD

## 2022-06-09 ENCOUNTER — Other Ambulatory Visit: Payer: Self-pay | Admitting: Internal Medicine

## 2022-06-09 DIAGNOSIS — N1832 Chronic kidney disease, stage 3b: Secondary | ICD-10-CM

## 2022-06-09 DIAGNOSIS — E118 Type 2 diabetes mellitus with unspecified complications: Secondary | ICD-10-CM

## 2022-06-16 NOTE — Progress Notes (Signed)
Sent message, via epic in basket, requesting order in epic from surgeon     06/16/22 1101  Preop Orders  Has preop orders? No  Name of staff/physician contacted for orders(Indicate phone or IB message) M. Gawne, PA-C.

## 2022-06-17 LAB — ALDOSTERONE + RENIN ACTIVITY W/ RATIO
ALDO / PRA Ratio: 8.3 ratio (ref 0.9–28.9)
Aldosterone: 4 ng/dL
Renin Activity: 0.48 ng/mL/h (ref 0.25–5.82)

## 2022-06-17 LAB — VITAMIN B1: Vitamin B1 (Thiamine): 29 nmol/L (ref 8–30)

## 2022-06-20 DIAGNOSIS — M25562 Pain in left knee: Secondary | ICD-10-CM | POA: Diagnosis not present

## 2022-06-21 NOTE — H&P (Signed)
KNEE ARTHROPLASTY ADMISSION H&P  Patient ID: Ronald Bond MRN: [id number] DOB/AGE: August 23, 1946 76 y.o.  Chief Complaint: left knee pain.  Planned Procedure Date: 07/06/22 Medical Clearance by Dr. Scarlette Calico   Cardiac Clearance by Dr. Glenetta Hew   HPI: Ronald Bond is a 76 y.o. male who presents for evaluation of FAILED LEFT TOTAL KNEE PROSTHESIS. The patient has a history of pain and functional disability in the left knee due to arthritis and loosening of the prosthesis components and has failed non-surgical conservative treatments for greater than 12 weeks to include NSAID's and/or analgesics, use of assistive devices, and activity modification.  Onset of symptoms was gradual, starting 3 months ago with rapidlly worsening course since that time. The patient noted prior procedures on the knee to include  arthroplasty on the left knee.  Patient currently rates pain at 10 out of 10 with activity. Patient has night pain, worsening of pain with activity and weight bearing, and pain that interferes with activities of daily living.  Patient has evidence of  loosening of the tibial plate, tibial plate tipping into varus alignment, osteolysis of the femur, and osteophytes  by imaging studies.  There is no active infection.  Past Medical History:  Diagnosis Date   GERD (gastroesophageal reflux disease)    HTN (hypertension)    Hyperlipidemia    Hypothyroidism    Osteoarthritis    Wears glasses    Past Surgical History:  Procedure Laterality Date   COLONOSCOPY     EYE SURGERY     both cataracts   MICROLARYNGOSCOPY Left 11/18/2013   Procedure: MICROLARYNGOSCOPY WITH REMOVAL OF GRANULOMA LEFT SIDE;  Surgeon: Izora Gala, MD;  Location: [facility];  Service: ENT;  Laterality: Left;   NM MYOVIEW LTD  06/2007   Low risk, normal.  No evidence of ischemia or infarction   THYROIDECTOMY  1974   TONSILLECTOMY     TOTAL KNEE ARTHROPLASTY  2009   left   Allergies  Allergen  Reactions   Lisinopril Other (See Comments)    Edema in face   Prior to Admission medications   Medication Sig Start Date End Date Taking? Authorizing Provider  albuterol (VENTOLIN HFA) 108 (90 Base) MCG/ACT inhaler Inhale 2 puffs into the lungs every 6 (six) hours as needed for wheezing or shortness of breath. 07/05/21  Yes Janith Lima, MD  amLODipine (NORVASC) 10 MG tablet Take 1 tablet (10 mg total) by mouth daily. 06/06/22  Yes Janith Lima, MD  aspirin EC 81 MG tablet Take 81 mg by mouth daily. Swallow whole.   Yes [provider]  atorvastatin (LIPITOR) 40 MG tablet TAKE 1 TABLET BY MOUTH EVERY DAY 12/27/21  Yes Janith Lima, MD  cetirizine (ZYRTEC) 10 MG tablet Take 10 mg by mouth daily.   Yes [provider]  cholecalciferol (VITAMIN D3) 25 MCG (1000 UNIT) tablet Take 1,000 Units by mouth daily.   Yes [provider]  docusate sodium (COLACE) 100 MG capsule Take 100 mg by mouth 2 (two) times daily.   Yes [provider]  FARXIGA 10 MG TABS tablet TAKE 1 TABLET BY MOUTH EVERY DAY BEFORE BREAKFAST 06/09/22  Yes Janith Lima, MD  fluticasone (FLONASE) 50 MCG/ACT nasal spray Place 2 sprays into both nostrils daily. Patient taking differently: Place 2 sprays into both nostrils daily as needed. 10/15/18  Yes Janith Lima, MD  hydrALAZINE (APRESOLINE) 25 MG tablet Take 1 tablet (25 mg total) by mouth  3 (three) times daily. 06/06/22  Yes Janith Lima, MD  indapamide (LOZOL) 1.25 MG tablet TAKE 1 TABLET BY MOUTH EVERY DAY 03/24/22  Yes Janith Lima, MD  KLOR-CON M20 20 MEQ tablet TAKE 1 TABLET BY MOUTH TWICE A DAY 05/14/22  Yes Janith Lima, MD  levothyroxine (SYNTHROID) 112 MCG tablet TAKE 1 TABLET BY MOUTH EVERY DAY 01/03/22  Yes Shamleffer, Melanie Crazier, MD  meloxicam (MOBIC) 7.5 MG tablet Take 7.5 mg by mouth daily.   Yes [provider]  metoprolol tartrate (LOPRESSOR) 25 MG tablet Take 0.5 tablets (12.5 mg total) by mouth 2  (two) times daily. 12/29/21 06/15/22 Yes Lendon Colonel, NP  Multiple Vitamin (MULTIVITAMIN) tablet Take 1 tablet by mouth daily.   Yes [provider]  Multiple Vitamins-Minerals (ZINC PO) Take 1 tablet by mouth daily.   Yes [provider]  omeprazole (PRILOSEC) 40 MG capsule TAKE 1 CAPSULE (40 MG TOTAL) BY MOUTH DAILY. 12/27/21  Yes Janith Lima, MD  Polyethyl Glycol-Propyl Glycol 0.4-0.3 % SOLN Place 1 drop into both eyes as needed (dry eyes).   Yes [provider]  tamsulosin (FLOMAX) 0.4 MG CAPS capsule Take 1 capsule (0.4 mg total) by mouth daily after supper. Patient taking differently: Take 0.4 mg by mouth 2 (two) times daily. 12/25/21  Yes Janith Lima, MD  TURMERIC CURCUMIN PO Take 1 tablet by mouth daily.   Yes [provider]  valsartan (DIOVAN) 320 MG tablet Take 1 tablet (320 mg total) by mouth daily. 12/27/21  Yes Leonie Man, MD  vitamin C (ASCORBIC ACID) 500 MG tablet Take 500 mg by mouth daily.   Yes [provider]  Blood Pressure Monitoring (BLOOD PRESSURE MONITOR/ARM) DEVI 1 Device by Does not apply route daily. USE DAILY TO  TAKE BLOOD PRESSURE READINGS 12/27/21   Leonie Man, MD   Social History   Socioeconomic History   Marital status: Married    Spouse name: Not on file   Number of children: 2   Years of education: Not on file   Highest education level: Not on file  Occupational History   Occupation: railroad  Tobacco Use   Smoking status: Never   Smokeless tobacco: Never  Vaping Use   Vaping Use: Never used  Substance and Sexual Activity   Alcohol use: Yes    Comment: occassionally   Drug use: No   Sexual activity: Yes  Other Topics Concern   Not on file  Social History Narrative   Regular Exercise -  YES      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  He  states that he enjoys  the job a lot does a lot of walking and climbing stairs without significant discomfort.      He usually works ~2 weeks @ a time - stays in [employer] (paid for by [employer]) - & comes home for ~4 d weekends.   Social Determinants of Health   Financial Resource Strain: Low Risk  (12/24/2021)   Overall Financial Resource Strain (CARDIA)    Difficulty of Paying Living Expenses: Not hard at all  Food Insecurity: No Food Insecurity (12/24/2021)   Hunger Vital Sign    Worried About Running Out of Food in the Last Year: Never true    Ran Out of Food  in the Last Year: Never true  Transportation Needs: No Transportation Needs (12/24/2021)   PRAPARE - Hydrologist (Medical): No    Lack of Transportation (Non-Medical): No  Physical Activity: Sufficiently Active (12/24/2021)   Exercise Vital Sign    Days of Exercise per Week: 7 days    Minutes of Exercise per Session: 30 min  Stress: No Stress Concern Present (12/24/2021)   [place]    Feeling of Stress : Not at all  Social Connections: [place] (12/24/2021)   Social Connection and Isolation Panel [NHANES]    Frequency of Communication with Friends and Family: More than three times a week    Frequency of Social Gatherings with Friends and Family: Not on file    Attends Religious Services: More than 4 times per year    Active Member of [employer] or Organizations: Yes    Attends Music therapist: More than 4 times per year    Marital Status: Married   Family History  Problem Relation Age of Onset   Brain cancer Mother    Heart disease Father    Heart disease Brother     ROS: Currently denies lightheadedness, dizziness, Fever, chills, CP, SOB.   No personal history of PE, MI, or CVA. +h/o DVT in 1979 treated with Coumadin No loose teeth or dentures All other systems have been reviewed and were otherwise  currently negative with the exception of those mentioned in the HPI and as above.  Objective: Vitals: Ht: 5'10" Wt: 208.6 lbs Temp: 98.9 BP: 128/70 Pulse: 56 O2 96% on room air.   Physical Exam: General: Alert, NAD.  Antalgic Gait  HEENT: EOMI, Good Neck Extension  Pulm: No increased work of breathing.  Clear B/L A/P w/o crackle or wheeze.  CV: RRR, No m/g/r appreciated  GI: soft, NT, ND. BS x 4 quadrants Neuro: CN II-XII grossly intact without focal deficit.  Sensation intact distally Skin: No lesions in the area of chief complaint MSK/Surgical Site:  no JLT. ROM 5-95 degrees.  5/5 strength in extension and flexion.  +EHL/FHL.  NVI.  Stable varus and valgus stress.    Imaging Review Plain radiographs demonstrate severe degenerative joint disease and loosening of the tibial components of the left knee.   The overall alignment ismild varus. The bone quality appears to be fair for age and reported activity level.  Preoperative templating of the joint replacement has been completed, documented, and submitted to the Operating Room personnel in order to optimize intra-operative equipment management.  Assessment: FAILED LEFT TOTAL KNEE PROSTHESIS Active Problems:   * No active hospital problems. *   Plan: Plan for Procedure(s): TOTAL KNEE REVISION  The patient history, physical exam, clinical judgement of the provider and imaging are consistent with end stage degenerative joint disease and total joint arthroplasty revision is deemed medically necessary. The treatment options including medical management, injection therapy, and arthroplasty were discussed at length. The risks and benefits of Procedure(s): TOTAL KNEE REVISION were presented and reviewed.  The risks of nonoperative treatment, versus surgical intervention including but not limited to continued pain, aseptic loosening, stiffness, dislocation/subluxation, infection, bleeding, nerve injury, blood clots, cardiopulmonary  complications, morbidity, mortality, among others were discussed. The patient verbalizes understanding and wishes to proceed with the plan.  Patient is being admitted for inpatient treatment for surgery, pain control, PT, prophylactic antibiotics, VTE prophylaxis, progressive ambulation, ADL's and discharge planning.   Dental prophylaxis discussed  and recommended for 2 years postoperatively.  The patient does meet the criteria for TXA which will be used perioperatively.   Eliquis or Xarelto  will be used postoperatively for DVT prophylaxis in addition to SCDs, and early ambulation. Plan for Tylenol, low dose Mobic, oxycodone for pain.   Zofran for nausea and vomiting. Sennakot for constipation prevention. Pharmacy- [facility] [address] in [facility] The patient is planning to be discharged home with HHPT ([facility]) and into the care of his wife Verdon Cummins who can be reached at [phone] Follow up appt 07/14/22 at 3:00pm  Anticipated LOS equal to or greater than 2 midnights due to - Age 94 and older with one or more of the following:  - Obesity  - Expected need for hospital services (PT, OT, Nursing) required for safe  discharge  - Anticipated need for postoperative skilled nursing care or inpatient rehab  - Active co-morbidities: Diabetes, DVT/VTE, Respiratory Failure/COPD, and CKD 3   Alisa Graff Office [phone] 06/21/2022 9:59 AM

## 2022-06-22 NOTE — Patient Instructions (Signed)
DUE TO COVID-19 ONLY TWO VISITORS  (aged 76 and older)  ARE ALLOWED TO COME WITH YOU AND STAY IN THE WAITING ROOM ONLY DURING PRE OP AND PROCEDURE.   **NO VISITORS ARE ALLOWED IN THE SHORT STAY AREA OR RECOVERY ROOM!!**  IF YOU WILL BE ADMITTED INTO THE HOSPITAL YOU ARE ALLOWED ONLY FOUR SUPPORT PEOPLE DURING VISITATION HOURS ONLY (7 AM -8PM)   The support person(s) must pass our screening, gel in and out, and wear a mask at all times, including in the patient's room. Patients must also wear a mask when staff or their support person are in the room. Visitors GUEST BADGE MUST BE WORN VISIBLY  One adult visitor may remain with you overnight and MUST be in the room by 8 P.M.     Your procedure is scheduled on: 07/06/22   Report to [facility] Main Entrance    Report to admitting at 6:00 AM   Call this number if you have problems the morning of surgery [phone]   Do not eat food :After Midnight.   After Midnight you may have the following liquids until _5:30_____ AM/  DAY OF SURGERY  Water Black Coffee (sugar ok, NO MILK/CREAM OR CREAMERS)  Tea (sugar ok, NO MILK/CREAM OR CREAMERS) regular and decaf                             Plain Jell-O (NO RED)                                           Fruit ices (not with fruit pulp, NO RED)                                     Popsicles (NO RED)                                                                  Juice: apple, WHITE grape, WHITE cranberry Sports drinks like Gatorade (NO RED)                    The day of surgery:  Drink ONE  G2 at 5:15 AM the morning of surgery. Drink in one sitting. Do not sip.  This drink was given to you during your hospital  pre-op appointment visit. Nothing else to drink after completing the  G2. At 5:30 AM          If you have questions, please contact your surgeon's office.       Oral Hygiene is also important to reduce your risk of infection.                                    Remember -  BRUSH YOUR TEETH THE MORNING OF SURGERY WITH YOUR REGULAR TOOTHPASTE   Do NOT smoke after Midnight  Use your inhaler and bring it with you   Before surgery.Stop taking _ASA '81mg'$ __________on __________as instructed by _____________.  Stop  taking ____________as directed by your Surgeon/Cardiologist.  Contact your Surgeon/Cardiologist for instructions on Anticoagulant Therapy prior to surgery.     Take these medicines the morning of surgery with A SIP OF WATER: Zyrtec, Lipitor, Hydralazine, Omeprazole, Metoprolol, Amlodipine, Flonase, Tamsulosin,Levothyroxine  DO NOT TAKE ANY ORAL DIABETIC MEDICATIONS DAY OF YOUR SURGERY  Stop the Farxiga 3 days prior to Day of surgery(8/6)  Bring CPAP mask and tubing day of surgery.                              You may not have any metal on your body including jewelry, and body piercing             Do not wear lotions, powders, perfumes/cologne, or deodorant               Men may shave face and neck.   Do not bring valuables to the hospital. [place].   Contacts, dentures or bridgework may not be worn into surgery.   Bring small overnight bag day of surgery.   DO NOT [place]. PHARMACY WILL DISPENSE MEDICATIONS LISTED ON YOUR MEDICATION LIST TO YOU DURING YOUR ADMISSION [place]!       Special Instructions: Bring a copy of your healthcare power of attorney and living will documents  the day of surgery if you haven't scanned them before.              Please read over the following fact sheets you were given: IF YOU HAVE QUESTIONS ABOUT YOUR PRE-OP INSTRUCTIONS PLEASE CALL [phone]     [facility] - Preparing for Surgery Before surgery, you can play an important role.  Because skin is not sterile, your skin needs to be as free of germs as possible.  You can reduce the number of germs on your skin by washing with CHG (chlorahexidine gluconate) soap  before surgery.  CHG is an antiseptic cleaner which kills germs and bonds with the skin to continue killing germs even after washing. Please DO NOT use if you have an allergy to CHG or antibacterial soaps.  If your skin becomes reddened/irritated stop using the CHG and inform your nurse when you arrive at Short Stay..  You may shave your face/neck. Please follow these instructions carefully:  1.  Shower with CHG Soap the night before surgery and the  morning of Surgery.  2.  If you choose to wash your hair, wash your hair first as usual with your  normal  shampoo.  3.  After you shampoo, rinse your hair and body thoroughly to remove the  shampoo.                            4.  Use CHG as you would any other liquid soap.  You can apply chg directly  to the skin and wash                       Gently with a scrungie or clean washcloth.  5.  Apply the CHG Soap to your body ONLY FROM THE NECK DOWN.   Do not use on face/ open  Wound or open sores. Avoid contact with eyes, ears mouth and genitals (private parts).                       Wash face,  Genitals (private parts) with your normal soap.             6.  Wash thoroughly, paying special attention to the area where your surgery  will be performed.  7.  Thoroughly rinse your body with warm water from the neck down.  8.  DO NOT shower/wash with your normal soap after using and rinsing off  the CHG Soap.                9.  Pat yourself dry with a clean towel.            10.  Wear clean pajamas.            11.  Place clean sheets on your bed the night of your first shower and do not  sleep with pets. Day of Surgery : Do not apply any lotions/deodorants the morning of surgery.  Please wear clean clothes to the hospital/surgery center.  FAILURE TO FOLLOW THESE INSTRUCTIONS MAY RESULT IN THE CANCELLATION OF YOUR SURGERY   ________________________________________________________________________   Incentive Spirometer  An  incentive spirometer is a tool that can help keep your lungs clear and active. This tool measures how well you are filling your lungs with each breath. Taking long deep breaths may help reverse or decrease the chance of developing breathing (pulmonary) problems (especially infection) following: A long period of time when you are unable to move or be active. BEFORE THE PROCEDURE  If the spirometer includes an indicator to show your best effort, your nurse or respiratory therapist will set it to a desired goal. If possible, sit up straight or lean slightly forward. Try not to slouch. Hold the incentive spirometer in an upright position. INSTRUCTIONS FOR USE  Sit on the edge of your bed if possible, or sit up as far as you can in bed or on a chair. Hold the incentive spirometer in an upright position. Breathe out normally. Place the mouthpiece in your mouth and seal your lips tightly around it. Breathe in slowly and as deeply as possible, raising the piston or the ball toward the top of the column. Hold your breath for 3-5 seconds or for as long as possible. Allow the piston or ball to fall to the bottom of the column. Remove the mouthpiece from your mouth and breathe out normally. Rest for a few seconds and repeat Steps 1 through 7 at least 10 times every 1-2 hours when you are awake. Take your time and take a few normal breaths between deep breaths. The spirometer may include an indicator to show your best effort. Use the indicator as a goal to work toward during each repetition. After each set of 10 deep breaths, practice coughing to be sure your lungs are clear. If you have an incision (the cut made at the time of surgery), support your incision when coughing by placing a pillow or rolled up towels firmly against it. Once you are able to get out of bed, walk around indoors and cough well. You may stop using the incentive spirometer when instructed by your caregiver.  RISKS AND COMPLICATIONS Take  your time so you do not get dizzy or light-headed. If you are in pain, you may need to take or ask for pain medication  before doing incentive spirometry. It is harder to take a deep breath if you are having pain. AFTER USE Rest and breathe slowly and easily. It can be helpful to keep track of a log of your progress. Your caregiver can provide you with a simple table to help with this. If you are using the spirometer at home, follow these instructions: [place] IF:  You are having difficultly using the spirometer. You have trouble using the spirometer as often as instructed. Your pain medication is not giving enough relief while using the spirometer. You develop fever of 100.5 F (38.1 C) or higher. SEEK IMMEDIATE MEDICAL CARE IF:  You cough up bloody sputum that had not been present before. You develop fever of 102 F (38.9 C) or greater. You develop worsening pain at or near the incision site. MAKE SURE YOU:  Understand these instructions. Will watch your condition. Will get help right away if you are not doing well or get worse. Document Released: 03/27/2007 Document Revised: 02/06/2012 Document Reviewed: 05/28/2007 [facility] Patient Information 2014 [place], [place].   ________________________________________________________________________

## 2022-06-23 ENCOUNTER — Encounter (HOSPITAL_COMMUNITY)
Admission: RE | Admit: 2022-06-23 | Discharge: 2022-06-23 | Disposition: A | Discharge status: Home / Self Care | Payer: Medicare HMO | Source: Ambulatory Visit | Attending: Orthopedic Surgery | Admitting: Orthopedic Surgery

## 2022-06-23 ENCOUNTER — Other Ambulatory Visit: Payer: Self-pay

## 2022-06-23 ENCOUNTER — Other Ambulatory Visit: Payer: Self-pay | Admitting: Internal Medicine

## 2022-06-23 ENCOUNTER — Encounter (HOSPITAL_COMMUNITY): Payer: Self-pay

## 2022-06-23 VITALS — BP 156/85 | HR 51 | Temp 98.7°F | Resp 18 | Ht 70.0 in | Wt 205.0 lb

## 2022-06-23 DIAGNOSIS — Z01818 Encounter for other preprocedural examination: Secondary | ICD-10-CM

## 2022-06-23 DIAGNOSIS — Z01812 Encounter for preprocedural laboratory examination: Secondary | ICD-10-CM | POA: Diagnosis present

## 2022-06-23 DIAGNOSIS — M25562 Pain in left knee: Secondary | ICD-10-CM

## 2022-06-23 HISTORY — DX: Prediabetes: R73.03

## 2022-06-23 LAB — TYPE AND SCREEN
ABO/RH(D): A POS
Antibody Screen: NEGATIVE

## 2022-06-23 LAB — SURGICAL PCR SCREEN
MRSA, PCR: NEGATIVE
Staphylococcus aureus: NEGATIVE

## 2022-06-23 LAB — GLUCOSE, CAPILLARY: Glucose-Capillary: 93 mg/dL (ref 70–99)

## 2022-06-23 NOTE — Progress Notes (Signed)
Anesthesia note:  Bowel prep reminder:NA  PCP - Dr. Alona Bene Cardiologist -Dr. Roni Bread Other-   Chest x-ray - no EKG - 06/06/22-epic Stress Test - 2011 ECHO - no Cardiac Cath -NA   Pacemaker/ICD device last checked:NA  Sleep Study - yes mild CPAP - no. Can't keep it on at night  Pt is pre diabetic-Pt reports being Pre" diabetic but takes [place] daily Fasting Blood Sugar - 96-106 Checks Blood Sugar _every 6 months or so____  Blood Thinner:ASA 81/ Dr. Ellyn Hack Blood Thinner Instructions: Aspirin Instructions:Stop 7 days prior to DOS/ Dr. Yetta Numbers Last Dose:06/28/20  Anesthesia review: no  Patient denies shortness of breath, fever, cough and chest pain at PAT appointment Pt has no SOB with any activities. He is usually active but the knee is unstable  Patient verbalized understanding of instructions that were given to them at the PAT appointment. Patient was also instructed that they will need to review over the PAT instructions again at home before surgery. Yes

## 2022-06-25 ENCOUNTER — Other Ambulatory Visit: Payer: Self-pay | Admitting: Internal Medicine

## 2022-06-25 DIAGNOSIS — N401 Enlarged prostate with lower urinary tract symptoms: Secondary | ICD-10-CM

## 2022-06-25 DIAGNOSIS — N410 Acute prostatitis: Secondary | ICD-10-CM

## 2022-06-26 ENCOUNTER — Other Ambulatory Visit: Payer: Self-pay | Admitting: Internal Medicine

## 2022-06-26 DIAGNOSIS — I119 Hypertensive heart disease without heart failure: Secondary | ICD-10-CM

## 2022-07-02 ENCOUNTER — Other Ambulatory Visit: Payer: Self-pay | Admitting: Internal Medicine

## 2022-07-02 DIAGNOSIS — K2100 Gastro-esophageal reflux disease with esophagitis, without bleeding: Secondary | ICD-10-CM

## 2022-07-02 DIAGNOSIS — E785 Hyperlipidemia, unspecified: Secondary | ICD-10-CM

## 2022-07-05 NOTE — Anesthesia Preprocedure Evaluation (Signed)
Anesthesia Evaluation  Patient identified by MRN, date of birth, ID band Patient awake    Reviewed: Allergy & Precautions, NPO status , Patient's Chart, lab work & pertinent test results, reviewed documented beta blocker date and time   Airway Mallampati: IV  TM Distance: >3 FB Neck ROM: Full    Dental  (+) Dental Advisory Given, Poor Dentition, Missing   Pulmonary asthma , sleep apnea ,    Pulmonary exam normal breath sounds clear to auscultation       Cardiovascular hypertension, Pt. on medications and Pt. on home beta blockers (-) angina(-) Cardiac Stents Normal cardiovascular exam Rhythm:Regular Rate:Normal     Neuro/Psych  Neuromuscular disease    GI/Hepatic Neg liver ROS, GERD  Medicated,  Endo/Other  diabetes, Type 2, Oral Hypoglycemic AgentsHypothyroidism   Renal/GU Renal InsufficiencyRenal disease     Musculoskeletal  (+) Arthritis , FAILED LEFT TOTAL KNEE PROSTHESIS   Abdominal   Peds  Hematology negative hematology ROS (+)   Anesthesia Other Findings   Reproductive/Obstetrics                            Anesthesia Physical Anesthesia Plan  ASA: 3  Anesthesia Plan: Spinal   Post-op Pain Management: Regional block* and Tylenol PO (pre-op)*   Induction: Intravenous  PONV Risk Score and Plan: 2 and Dexamethasone, Ondansetron and Treatment may vary due to age or medical condition  Airway Management Planned: Natural Airway and Nasal Cannula  Additional Equipment:   Intra-op Plan:   Post-operative Plan:   Informed Consent: I have reviewed the patients History and Physical, chart, labs and discussed the procedure including the risks, benefits and alternatives for the proposed anesthesia with the patient or authorized representative who has indicated his/her understanding and acceptance.     Dental advisory given  Plan Discussed with: CRNA  Anesthesia Plan Comments:         Anesthesia Quick Evaluation

## 2022-07-06 ENCOUNTER — Inpatient Hospital Stay (HOSPITAL_COMMUNITY): Payer: Medicare HMO | Admitting: Anesthesiology

## 2022-07-06 ENCOUNTER — Inpatient Hospital Stay (HOSPITAL_COMMUNITY)
Admission: RE | Admit: 2022-07-06 | Discharge: 2022-07-08 | DRG: 468 | Disposition: A | Discharge status: Home Health | Payer: Medicare HMO | Attending: Orthopedic Surgery | Admitting: Orthopedic Surgery

## 2022-07-06 ENCOUNTER — Other Ambulatory Visit: Payer: Self-pay

## 2022-07-06 ENCOUNTER — Encounter (HOSPITAL_COMMUNITY)
Admission: RE | Disposition: A | Discharge status: Home Health | Payer: Self-pay | Source: Home / Self Care | Attending: Orthopedic Surgery

## 2022-07-06 ENCOUNTER — Encounter (HOSPITAL_COMMUNITY): Payer: Self-pay | Admitting: Orthopedic Surgery

## 2022-07-06 ENCOUNTER — Inpatient Hospital Stay (HOSPITAL_COMMUNITY): Payer: Medicare HMO

## 2022-07-06 DIAGNOSIS — I10 Essential (primary) hypertension: Secondary | ICD-10-CM | POA: Diagnosis not present

## 2022-07-06 DIAGNOSIS — Z6829 Body mass index (BMI) 29.0-29.9, adult: Secondary | ICD-10-CM

## 2022-07-06 DIAGNOSIS — J449 Chronic obstructive pulmonary disease, unspecified: Secondary | ICD-10-CM | POA: Diagnosis not present

## 2022-07-06 DIAGNOSIS — Z79890 Hormone replacement therapy (postmenopausal): Secondary | ICD-10-CM | POA: Diagnosis not present

## 2022-07-06 DIAGNOSIS — Z7984 Long term (current) use of oral hypoglycemic drugs: Secondary | ICD-10-CM

## 2022-07-06 DIAGNOSIS — T84063A Wear of articular bearing surface of internal prosthetic left knee joint, initial encounter: Secondary | ICD-10-CM | POA: Diagnosis not present

## 2022-07-06 DIAGNOSIS — Z86718 Personal history of other venous thrombosis and embolism: Secondary | ICD-10-CM

## 2022-07-06 DIAGNOSIS — Z96652 Presence of left artificial knee joint: Secondary | ICD-10-CM | POA: Diagnosis not present

## 2022-07-06 DIAGNOSIS — N183 Chronic kidney disease, stage 3 unspecified: Secondary | ICD-10-CM | POA: Diagnosis present

## 2022-07-06 DIAGNOSIS — Z79899 Other long term (current) drug therapy: Secondary | ICD-10-CM

## 2022-07-06 DIAGNOSIS — E890 Postprocedural hypothyroidism: Secondary | ICD-10-CM | POA: Diagnosis present

## 2022-07-06 DIAGNOSIS — E669 Obesity, unspecified: Secondary | ICD-10-CM | POA: Diagnosis present

## 2022-07-06 DIAGNOSIS — Y792 Prosthetic and other implants, materials and accessory orthopedic devices associated with adverse incidents: Secondary | ICD-10-CM | POA: Diagnosis present

## 2022-07-06 DIAGNOSIS — Z8249 Family history of ischemic heart disease and other diseases of the circulatory system: Secondary | ICD-10-CM

## 2022-07-06 DIAGNOSIS — E785 Hyperlipidemia, unspecified: Secondary | ICD-10-CM | POA: Diagnosis not present

## 2022-07-06 DIAGNOSIS — I129 Hypertensive chronic kidney disease with stage 1 through stage 4 chronic kidney disease, or unspecified chronic kidney disease: Secondary | ICD-10-CM | POA: Diagnosis not present

## 2022-07-06 DIAGNOSIS — M65862 Other synovitis and tenosynovitis, left lower leg: Secondary | ICD-10-CM | POA: Diagnosis not present

## 2022-07-06 DIAGNOSIS — T84033A Mechanical loosening of internal left knee prosthetic joint, initial encounter: Secondary | ICD-10-CM | POA: Diagnosis not present

## 2022-07-06 DIAGNOSIS — T84018A Broken internal joint prosthesis, other site, initial encounter: Secondary | ICD-10-CM | POA: Diagnosis not present

## 2022-07-06 DIAGNOSIS — Z471 Aftercare following joint replacement surgery: Secondary | ICD-10-CM | POA: Diagnosis not present

## 2022-07-06 DIAGNOSIS — Z9889 Other specified postprocedural states: Secondary | ICD-10-CM | POA: Diagnosis not present

## 2022-07-06 DIAGNOSIS — Z791 Long term (current) use of non-steroidal anti-inflammatories (NSAID): Secondary | ICD-10-CM

## 2022-07-06 DIAGNOSIS — Z808 Family history of malignant neoplasm of other organs or systems: Secondary | ICD-10-CM

## 2022-07-06 DIAGNOSIS — G4730 Sleep apnea, unspecified: Secondary | ICD-10-CM | POA: Diagnosis not present

## 2022-07-06 DIAGNOSIS — Z888 Allergy status to other drugs, medicaments and biological substances status: Secondary | ICD-10-CM | POA: Diagnosis not present

## 2022-07-06 DIAGNOSIS — E1122 Type 2 diabetes mellitus with diabetic chronic kidney disease: Secondary | ICD-10-CM | POA: Diagnosis present

## 2022-07-06 DIAGNOSIS — M21162 Varus deformity, not elsewhere classified, left knee: Secondary | ICD-10-CM | POA: Diagnosis not present

## 2022-07-06 DIAGNOSIS — G8918 Other acute postprocedural pain: Secondary | ICD-10-CM | POA: Diagnosis not present

## 2022-07-06 DIAGNOSIS — Z96642 Presence of left artificial hip joint: Secondary | ICD-10-CM | POA: Diagnosis not present

## 2022-07-06 DIAGNOSIS — K219 Gastro-esophageal reflux disease without esophagitis: Secondary | ICD-10-CM | POA: Diagnosis present

## 2022-07-06 DIAGNOSIS — R001 Bradycardia, unspecified: Secondary | ICD-10-CM | POA: Diagnosis not present

## 2022-07-06 DIAGNOSIS — T84013A Broken internal left knee prosthesis, initial encounter: Secondary | ICD-10-CM | POA: Diagnosis not present

## 2022-07-06 DIAGNOSIS — E119 Type 2 diabetes mellitus without complications: Secondary | ICD-10-CM

## 2022-07-06 DIAGNOSIS — Z96659 Presence of unspecified artificial knee joint: Principal | ICD-10-CM

## 2022-07-06 HISTORY — PX: TOTAL KNEE REVISION: SHX996

## 2022-07-06 LAB — GLUCOSE, CAPILLARY
Glucose-Capillary: 118 mg/dL — ABNORMAL HIGH (ref 70–99)
Glucose-Capillary: 131 mg/dL — ABNORMAL HIGH (ref 70–99)

## 2022-07-06 SURGERY — TOTAL KNEE REVISION
Anesthesia: Spinal | Site: Knee | Laterality: Left

## 2022-07-06 MED ORDER — PROPOFOL 1000 MG/100ML IV EMUL
INTRAVENOUS | Status: AC
  Filled 2022-07-06: qty 100

## 2022-07-06 MED ORDER — ZOLPIDEM TARTRATE 5 MG PO TABS: 5.0000 mg | ORAL_TABLET | Freq: Every evening | ORAL | Status: DC | PRN

## 2022-07-06 MED ORDER — TAMSULOSIN HCL 0.4 MG PO CAPS
0.4000 mg | ORAL_CAPSULE | Freq: Every day | ORAL | Status: DC
  Administered 2022-07-07: 0.4 mg via ORAL
  Filled 2022-07-06 (×2): qty 1

## 2022-07-06 MED ORDER — HYDROMORPHONE HCL 1 MG/ML IJ SOLN
INTRAMUSCULAR | Status: DC | PRN
  Administered 2022-07-06 (×4): .5 mg via INTRAVENOUS

## 2022-07-06 MED ORDER — LACTATED RINGERS IV SOLN: INTRAVENOUS | Status: DC

## 2022-07-06 MED ORDER — METOPROLOL TARTRATE 12.5 MG HALF TABLET
12.5000 mg | ORAL_TABLET | Freq: Two times a day (BID) | ORAL | Status: DC
  Administered 2022-07-06: 12.5 mg via ORAL
  Filled 2022-07-06: qty 1

## 2022-07-06 MED ORDER — ASCORBIC ACID 500 MG PO TABS
500.0000 mg | ORAL_TABLET | Freq: Every day | ORAL | Status: DC
  Administered 2022-07-07 – 2022-07-08 (×2): 500 mg via ORAL
  Filled 2022-07-06 (×2): qty 1

## 2022-07-06 MED ORDER — SODIUM CHLORIDE 0.9% FLUSH
INTRAVENOUS | Status: DC | PRN
  Administered 2022-07-06: 60 mL via INTRAVENOUS

## 2022-07-06 MED ORDER — APIXABAN 2.5 MG PO TABS
2.5000 mg | ORAL_TABLET | Freq: Two times a day (BID) | ORAL | Status: DC
  Administered 2022-07-07 – 2022-07-08 (×3): 2.5 mg via ORAL
  Filled 2022-07-06 (×3): qty 1

## 2022-07-06 MED ORDER — LEVOTHYROXINE SODIUM 112 MCG PO TABS
112.0000 ug | ORAL_TABLET | Freq: Every day | ORAL | Status: DC
  Administered 2022-07-07 – 2022-07-08 (×2): 112 ug via ORAL
  Filled 2022-07-06 (×2): qty 1

## 2022-07-06 MED ORDER — ACETAMINOPHEN 500 MG PO TABS
1000.0000 mg | ORAL_TABLET | Freq: Once | ORAL | Status: AC
  Administered 2022-07-06: 1000 mg via ORAL
  Filled 2022-07-06: qty 2

## 2022-07-06 MED ORDER — DEXAMETHASONE SODIUM PHOSPHATE 10 MG/ML IJ SOLN
INTRAMUSCULAR | Status: AC
Start: 2022-07-06 — End: ?
  Filled 2022-07-06: qty 1

## 2022-07-06 MED ORDER — BUPIVACAINE IN DEXTROSE 0.75-8.25 % IT SOLN
INTRATHECAL | Status: DC | PRN
  Administered 2022-07-06: 2 mL via INTRATHECAL

## 2022-07-06 MED ORDER — PROPOFOL 500 MG/50ML IV EMUL
INTRAVENOUS | Status: DC | PRN
  Administered 2022-07-06: 100 ug/kg/min via INTRAVENOUS

## 2022-07-06 MED ORDER — DOCUSATE SODIUM 100 MG PO CAPS
100.0000 mg | ORAL_CAPSULE | Freq: Two times a day (BID) | ORAL | Status: DC
  Administered 2022-07-06 – 2022-07-08 (×4): 100 mg via ORAL
  Filled 2022-07-06 (×4): qty 1

## 2022-07-06 MED ORDER — ALBUTEROL SULFATE (2.5 MG/3ML) 0.083% IN NEBU
3.0000 mL | INHALATION_SOLUTION | Freq: Four times a day (QID) | RESPIRATORY_TRACT | Status: DC | PRN
Start: 2022-07-06 — End: 2022-07-08

## 2022-07-06 MED ORDER — PHENYLEPHRINE 80 MCG/ML (10ML) SYRINGE FOR IV PUSH (FOR BLOOD PRESSURE SUPPORT)
PREFILLED_SYRINGE | INTRAVENOUS | Status: DC | PRN
  Administered 2022-07-06 (×4): 80 ug via INTRAVENOUS

## 2022-07-06 MED ORDER — TRANEXAMIC ACID-NACL 1000-0.7 MG/100ML-% IV SOLN
1000.0000 mg | INTRAVENOUS | Status: AC
Start: 2022-07-06 — End: 2022-07-06
  Administered 2022-07-06: 1000 mg via INTRAVENOUS
  Filled 2022-07-06: qty 100

## 2022-07-06 MED ORDER — HYDROMORPHONE HCL 1 MG/ML IJ SOLN
0.5000 mg | INTRAMUSCULAR | Status: DC | PRN
  Administered 2022-07-07 – 2022-07-08 (×3): 1 mg via INTRAVENOUS
  Filled 2022-07-06 (×4): qty 1

## 2022-07-06 MED ORDER — ACETAMINOPHEN 500 MG PO TABS
1000.0000 mg | ORAL_TABLET | Freq: Four times a day (QID) | ORAL | Status: AC
  Administered 2022-07-06 – 2022-07-07 (×4): 1000 mg via ORAL
  Filled 2022-07-06 (×4): qty 2

## 2022-07-06 MED ORDER — DIPHENHYDRAMINE HCL 12.5 MG/5ML PO ELIX
12.5000 mg | ORAL_SOLUTION | ORAL | Status: DC | PRN
  Administered 2022-07-08: 25 mg via ORAL
  Filled 2022-07-06: qty 10

## 2022-07-06 MED ORDER — PHENOL 1.4 % MT LIQD: 1.0000 | OROMUCOSAL | Status: DC | PRN

## 2022-07-06 MED ORDER — BUPIVACAINE LIPOSOME 1.3 % IJ SUSP
INTRAMUSCULAR | Status: AC
  Filled 2022-07-06: qty 20

## 2022-07-06 MED ORDER — PROPOFOL 10 MG/ML IV BOLUS
INTRAVENOUS | Status: DC | PRN
  Administered 2022-07-06: 20 mg via INTRAVENOUS

## 2022-07-06 MED ORDER — ONDANSETRON HCL 4 MG PO TABS: 4.0000 mg | ORAL_TABLET | Freq: Four times a day (QID) | ORAL | Status: DC | PRN

## 2022-07-06 MED ORDER — 0.9 % SODIUM CHLORIDE (POUR BTL) OPTIME
TOPICAL | Status: DC | PRN
  Administered 2022-07-06: 1000 mL

## 2022-07-06 MED ORDER — ATORVASTATIN CALCIUM 40 MG PO TABS
40.0000 mg | ORAL_TABLET | Freq: Every day | ORAL | Status: DC
  Administered 2022-07-07 – 2022-07-08 (×2): 40 mg via ORAL
  Filled 2022-07-06 (×2): qty 1

## 2022-07-06 MED ORDER — ONDANSETRON HCL 4 MG/2ML IJ SOLN: 4.0000 mg | Freq: Once | INTRAMUSCULAR | Status: DC | PRN

## 2022-07-06 MED ORDER — IRBESARTAN 150 MG PO TABS
300.0000 mg | ORAL_TABLET | Freq: Every day | ORAL | Status: DC
  Administered 2022-07-07 – 2022-07-08 (×2): 300 mg via ORAL
  Filled 2022-07-06 (×2): qty 2

## 2022-07-06 MED ORDER — KETOROLAC TROMETHAMINE 15 MG/ML IJ SOLN
7.5000 mg | Freq: Four times a day (QID) | INTRAMUSCULAR | Status: DC
  Administered 2022-07-06 – 2022-07-07 (×3): 7.5 mg via INTRAVENOUS
  Filled 2022-07-06 (×4): qty 1

## 2022-07-06 MED ORDER — OXYCODONE HCL 5 MG PO TABS
5.0000 mg | ORAL_TABLET | ORAL | Status: DC | PRN
  Administered 2022-07-06 – 2022-07-08 (×10): 10 mg via ORAL
  Filled 2022-07-06 (×10): qty 2

## 2022-07-06 MED ORDER — HYDRALAZINE HCL 25 MG PO TABS
25.0000 mg | ORAL_TABLET | Freq: Three times a day (TID) | ORAL | Status: DC
  Administered 2022-07-06 – 2022-07-07 (×3): 25 mg via ORAL
  Filled 2022-07-06 (×4): qty 1

## 2022-07-06 MED ORDER — MENTHOL 3 MG MT LOZG
1.0000 | LOZENGE | OROMUCOSAL | Status: DC | PRN
Start: 2022-07-06 — End: 2022-07-08

## 2022-07-06 MED ORDER — ISOPROPYL ALCOHOL 70 % SOLN
Status: DC | PRN
  Administered 2022-07-06: 1 via TOPICAL

## 2022-07-06 MED ORDER — PANTOPRAZOLE SODIUM 40 MG PO TBEC
40.0000 mg | DELAYED_RELEASE_TABLET | Freq: Every day | ORAL | Status: DC
  Administered 2022-07-06 – 2022-07-08 (×3): 40 mg via ORAL
  Filled 2022-07-06 (×4): qty 1

## 2022-07-06 MED ORDER — POTASSIUM CHLORIDE CRYS ER 20 MEQ PO TBCR
20.0000 meq | EXTENDED_RELEASE_TABLET | Freq: Two times a day (BID) | ORAL | Status: DC
  Administered 2022-07-06 – 2022-07-08 (×4): 20 meq via ORAL
  Filled 2022-07-06 (×4): qty 1

## 2022-07-06 MED ORDER — SODIUM CHLORIDE (PF) 0.9 % IJ SOLN
INTRAMUSCULAR | Status: AC
  Filled 2022-07-06: qty 10

## 2022-07-06 MED ORDER — SODIUM CHLORIDE 0.9 % IR SOLN
Status: DC | PRN
  Administered 2022-07-06: 3000 mL

## 2022-07-06 MED ORDER — FENTANYL CITRATE (PF) 100 MCG/2ML IJ SOLN
INTRAMUSCULAR | Status: AC
  Filled 2022-07-06: qty 2

## 2022-07-06 MED ORDER — LORATADINE 10 MG PO TABS
10.0000 mg | ORAL_TABLET | Freq: Every day | ORAL | Status: DC
  Administered 2022-07-07 – 2022-07-08 (×2): 10 mg via ORAL
  Filled 2022-07-06 (×2): qty 1

## 2022-07-06 MED ORDER — ORAL CARE MOUTH RINSE: 15.0000 mL | Freq: Once | OROMUCOSAL | Status: AC

## 2022-07-06 MED ORDER — ACETAMINOPHEN 500 MG PO TABS: 1000.0000 mg | ORAL_TABLET | Freq: Once | ORAL | Status: DC

## 2022-07-06 MED ORDER — EPHEDRINE SULFATE-NACL 50-0.9 MG/10ML-% IV SOSY
PREFILLED_SYRINGE | INTRAVENOUS | Status: DC | PRN
  Administered 2022-07-06 (×5): 5 mg via INTRAVENOUS

## 2022-07-06 MED ORDER — BUPIVACAINE LIPOSOME 1.3 % IJ SUSP: 20.0000 mL | Freq: Once | INTRAMUSCULAR | Status: DC

## 2022-07-06 MED ORDER — ROPIVACAINE HCL 5 MG/ML IJ SOLN
INTRAMUSCULAR | Status: DC | PRN
  Administered 2022-07-06: 20 mL via PERINEURAL

## 2022-07-06 MED ORDER — HYDROMORPHONE HCL 2 MG/ML IJ SOLN
INTRAMUSCULAR | Status: AC
  Filled 2022-07-06: qty 1

## 2022-07-06 MED ORDER — ACETAMINOPHEN 325 MG PO TABS: 325.0000 mg | ORAL_TABLET | Freq: Four times a day (QID) | ORAL | Status: DC | PRN

## 2022-07-06 MED ORDER — POVIDONE-IODINE 10 % EX SWAB
2.0000 | Freq: Once | CUTANEOUS | Status: AC
  Administered 2022-07-06: 2 via TOPICAL

## 2022-07-06 MED ORDER — PHENYLEPHRINE 80 MCG/ML (10ML) SYRINGE FOR IV PUSH (FOR BLOOD PRESSURE SUPPORT)
PREFILLED_SYRINGE | INTRAVENOUS | Status: AC
  Filled 2022-07-06: qty 10

## 2022-07-06 MED ORDER — CEFAZOLIN SODIUM-DEXTROSE 2-4 GM/100ML-% IV SOLN
2.0000 g | INTRAVENOUS | Status: AC
  Administered 2022-07-06: 2 g via INTRAVENOUS
  Filled 2022-07-06: qty 100

## 2022-07-06 MED ORDER — CHLORHEXIDINE GLUCONATE 0.12 % MT SOLN
15.0000 mL | Freq: Once | OROMUCOSAL | Status: AC
  Administered 2022-07-06: 15 mL via OROMUCOSAL

## 2022-07-06 MED ORDER — FENTANYL CITRATE PF 50 MCG/ML IJ SOSY: 25.0000 ug | PREFILLED_SYRINGE | INTRAMUSCULAR | Status: DC | PRN

## 2022-07-06 MED ORDER — FENTANYL CITRATE (PF) 100 MCG/2ML IJ SOLN
INTRAMUSCULAR | Status: DC | PRN
  Administered 2022-07-06 (×2): 50 ug via INTRAVENOUS

## 2022-07-06 MED ORDER — ONDANSETRON HCL 4 MG/2ML IJ SOLN
INTRAMUSCULAR | Status: AC
  Filled 2022-07-06: qty 2

## 2022-07-06 MED ORDER — POLYETHYLENE GLYCOL 3350 17 G PO PACK
17.0000 g | PACK | Freq: Every day | ORAL | Status: DC | PRN
  Administered 2022-07-07 – 2022-07-08 (×2): 17 g via ORAL
  Filled 2022-07-06 (×2): qty 1

## 2022-07-06 MED ORDER — DEXAMETHASONE SODIUM PHOSPHATE 10 MG/ML IJ SOLN
8.0000 mg | Freq: Once | INTRAMUSCULAR | Status: AC
  Administered 2022-07-06: 8 mg via INTRAVENOUS

## 2022-07-06 MED ORDER — WATER FOR IRRIGATION, STERILE IR SOLN
Status: DC | PRN
  Administered 2022-07-06: 2000 mL

## 2022-07-06 MED ORDER — ONDANSETRON HCL 4 MG/2ML IJ SOLN
4.0000 mg | Freq: Four times a day (QID) | INTRAMUSCULAR | Status: DC | PRN
  Administered 2022-07-06: 4 mg via INTRAVENOUS
  Filled 2022-07-06: qty 2

## 2022-07-06 MED ORDER — METHOCARBAMOL 500 MG PO TABS
500.0000 mg | ORAL_TABLET | Freq: Four times a day (QID) | ORAL | Status: DC | PRN
  Administered 2022-07-07 – 2022-07-08 (×3): 500 mg via ORAL
  Filled 2022-07-06 (×3): qty 1

## 2022-07-06 MED ORDER — POVIDONE-IODINE 10 % EX SWAB: 2.0000 | Freq: Once | CUTANEOUS | Status: DC

## 2022-07-06 MED ORDER — EPHEDRINE 5 MG/ML INJ
INTRAVENOUS | Status: AC
  Filled 2022-07-06: qty 5

## 2022-07-06 MED ORDER — ISOPROPYL ALCOHOL 70 % SOLN
Status: AC
  Filled 2022-07-06: qty 480

## 2022-07-06 MED ORDER — INDAPAMIDE 1.25 MG PO TABS
1.2500 mg | ORAL_TABLET | Freq: Every day | ORAL | Status: DC
  Administered 2022-07-07 – 2022-07-08 (×2): 1.25 mg via ORAL
  Filled 2022-07-06 (×2): qty 1

## 2022-07-06 MED ORDER — AMLODIPINE BESYLATE 10 MG PO TABS
10.0000 mg | ORAL_TABLET | Freq: Every day | ORAL | Status: DC
  Administered 2022-07-07 – 2022-07-08 (×2): 10 mg via ORAL
  Filled 2022-07-06 (×2): qty 1

## 2022-07-06 MED ORDER — SODIUM CHLORIDE (PF) 0.9 % IJ SOLN
INTRAMUSCULAR | Status: AC
  Filled 2022-07-06: qty 50

## 2022-07-06 MED ORDER — BUPIVACAINE LIPOSOME 1.3 % IJ SUSP
INTRAMUSCULAR | Status: DC | PRN
  Administered 2022-07-06: 20 mL

## 2022-07-06 MED ORDER — ONDANSETRON HCL 4 MG/2ML IJ SOLN
INTRAMUSCULAR | Status: DC | PRN
  Administered 2022-07-06: 4 mg via INTRAVENOUS

## 2022-07-06 MED ORDER — METHOCARBAMOL 1000 MG/10ML IJ SOLN: 500.0000 mg | Freq: Four times a day (QID) | INTRAVENOUS | Status: DC | PRN

## 2022-07-06 MED ORDER — CEFAZOLIN SODIUM-DEXTROSE 2-4 GM/100ML-% IV SOLN
2.0000 g | Freq: Three times a day (TID) | INTRAVENOUS | Status: DC
  Administered 2022-07-06 – 2022-07-08 (×6): 2 g via INTRAVENOUS
  Filled 2022-07-06 (×7): qty 100

## 2022-07-06 MED ORDER — FLUTICASONE PROPIONATE 50 MCG/ACT NA SUSP: 2.0000 | Freq: Every day | NASAL | Status: DC | PRN

## 2022-07-06 MED ORDER — VITAMIN D 25 MCG (1000 UNIT) PO TABS
1000.0000 [IU] | ORAL_TABLET | Freq: Every day | ORAL | Status: DC
  Administered 2022-07-07 – 2022-07-08 (×2): 1000 [IU] via ORAL
  Filled 2022-07-06 (×2): qty 1

## 2022-07-06 SURGICAL SUPPLY — 93 items
ADH SKN CLS APL DERMABOND .7 (GAUZE/BANDAGES/DRESSINGS) ×1
APL PRP STRL LF DISP 70% ISPRP (MISCELLANEOUS) ×2
AUG FEM 9 9+ 10 STRL KN DIST (Joint) ×1 IMPLANT
AUG FEM 9 9+ 5 STRL KN DIST (Miscellaneous) ×1 IMPLANT
AUG FEM 9 9+ 5 STRL LF KN POST (Miscellaneous) ×1 IMPLANT
AUG FEM DIST KNEE 9/9+ 10 (Joint) ×2 IMPLANT
AUG FEM MED CONE CNTR STRL LF (Knees) ×1 IMPLANT
AUG TIB EF 10 HLF BLCK STRL LM (Joint) ×1 IMPLANT
AUG TIB EF 5 HLF BLCK STRL LL (Joint) ×1 IMPLANT
AUG TIB HALF BLOCK PS EF LM 10 (Joint) ×2 IMPLANT
AUG TIB XS CONE CNTR STRL LF (Insert) ×1 IMPLANT
AUGMENT FEM DIST KNEE 9/9+ 10 (Joint) IMPLANT
AUGMENT PSN FEM DIST SZ9 5 (Miscellaneous) ×1 IMPLANT
AUGMENT TIB HALF BLC PS LM 10 (Joint) IMPLANT
AUGMENT TIB TI 5 SZ EF OUTSIDE (Joint) ×1 IMPLANT
BAG COUNTER SPONGE SURGICOUNT (BAG) IMPLANT
BAG SPNG CNTER NS LX DISP (BAG)
BLADE AVERAGE 25X9 (BLADE) ×1 IMPLANT
BLADE HEX COATED 2.75 (ELECTRODE) ×2 IMPLANT
BLADE SAG 18X100X1.27 (BLADE) ×2 IMPLANT
BLADE SAW SAG 35X64 .89 (BLADE) ×2 IMPLANT
BNDG CMPR MED 10X6 ELC LF (GAUZE/BANDAGES/DRESSINGS) ×1
BNDG ELASTIC 6X10 VLCR STRL LF (GAUZE/BANDAGES/DRESSINGS) ×2 IMPLANT
BOWL SMART MIX CTS (DISPOSABLE) ×2 IMPLANT
CEMENT BONE REFOBACIN R1X40 US (Cement) ×5 IMPLANT
CHLORAPREP W/TINT 26 (MISCELLANEOUS) ×4 IMPLANT
COMP FEM TI MET KNEE 9 (Knees) ×2 IMPLANT
COMPONENT FEM TI MET KNEE 9 (Knees) IMPLANT
COMPONENT PSN TIB FIX SZ F LT (Miscellaneous) ×1 IMPLANT
COVER SURGICAL LIGHT HANDLE (MISCELLANEOUS) ×2 IMPLANT
CUFF TOURN SGL QUICK 34 (TOURNIQUET CUFF) ×2
CUFF TRNQT CYL 34X4.125X (TOURNIQUET CUFF) ×1 IMPLANT
DERMABOND ADVANCED (GAUZE/BANDAGES/DRESSINGS) ×1
DERMABOND ADVANCED .7 DNX12 (GAUZE/BANDAGES/DRESSINGS) ×1 IMPLANT
DRAPE INCISE IOBAN 66X45 STRL (DRAPES) IMPLANT
DRAPE INCISE IOBAN 85X60 (DRAPES) ×2 IMPLANT
DRAPE SHEET LG 3/4 BI-LAMINATE (DRAPES) ×2 IMPLANT
DRAPE U-SHAPE 47X51 STRL (DRAPES) ×2 IMPLANT
DRESSING AQUACEL AG SP 3.5X10 (GAUZE/BANDAGES/DRESSINGS) ×1 IMPLANT
DRESSING PEEL AND PLAC PRVNA20 (GAUZE/BANDAGES/DRESSINGS) IMPLANT
DRSG AQUACEL AG SP 3.5X10 (GAUZE/BANDAGES/DRESSINGS) ×2
DRSG PEEL AND PLACE PREVENA 20 (GAUZE/BANDAGES/DRESSINGS) ×2
EZYWRAP ICE PACK ×1 IMPLANT
GLOVE BIO SURGEON STRL SZ7.5 (GLOVE) ×4 IMPLANT
GLOVE BIO SURGEON STRL SZ8 (GLOVE) ×4 IMPLANT
GLOVE BIOGEL PI IND STRL 7.5 (GLOVE) ×1 IMPLANT
GLOVE BIOGEL PI IND STRL 8 (GLOVE) ×1 IMPLANT
GLOVE BIOGEL PI INDICATOR 7.5 (GLOVE) ×1
GLOVE BIOGEL PI INDICATOR 8 (GLOVE) ×1
GOWN STRL REUS W/ TWL XL LVL3 (GOWN DISPOSABLE) ×1 IMPLANT
GOWN STRL REUS W/TWL XL LVL3 (GOWN DISPOSABLE) ×2
HANDPIECE INTERPULSE COAX TIP (DISPOSABLE) ×2
HDLS TROCR DRIL PIN KNEE 75 (PIN) ×2
HOLDER FOLEY CATH W/STRAP (MISCELLANEOUS) ×1 IMPLANT
HOOD PEEL AWAY FLYTE STAYCOOL (MISCELLANEOUS) ×6 IMPLANT
INSERT TIB KNEE EF/7-9 10 LT (Insert) ×1 IMPLANT
INSERT TIB PS CMTLS CC XSM (Insert) ×1 IMPLANT
INSTR SCRW HEX REV FIX 3.5X48 (ORTHOPEDIC DISPOSABLE SUPPLIES) ×2
INSTRUMENT SCRW HEX REV 3.5X48 (ORTHOPEDIC DISPOSABLE SUPPLIES) IMPLANT
KIT DRSG PREVENA PLUS 7DAY 125 (MISCELLANEOUS) ×1 IMPLANT
LCS COMPLETE 3PEG PATELLA METAL BACKED POLY REPLAC (Orthopedic Implant) ×1 IMPLANT
MANIFOLD NEPTUNE II (INSTRUMENTS) ×2 IMPLANT
MARKER SKIN DUAL TIP RULER LAB (MISCELLANEOUS) ×4 IMPLANT
NS IRRIG 1000ML POUR BTL (IV SOLUTION) ×2 IMPLANT
PACK TOTAL KNEE CUSTOM (KITS) ×2 IMPLANT
PATELLA REPLACEMENT 3PEG LG (Knees) ×1 IMPLANT
PIN DRILL HDLS TROCAR 75 4PK (PIN) IMPLANT
PROTECTOR NERVE ULNAR (MISCELLANEOUS) ×2 IMPLANT
SCREW HEX HEADED 3.5X27 DISP (ORTHOPEDIC DISPOSABLE SUPPLIES) ×1 IMPLANT
SET HNDPC FAN SPRY TIP SCT (DISPOSABLE) ×1 IMPLANT
SOLUTION IRRIG SURGIPHOR (IV SOLUTION) ×2 IMPLANT
SPIKE FLUID TRANSFER (MISCELLANEOUS) ×2 IMPLANT
STEM FEM OFFSET 135X16X3 (Stem) ×1 IMPLANT
STEM STRT SPLINE EXT PS 18X135 (Stem) ×1 IMPLANT
SUT ETHILON 3 0 PS 1 (SUTURE) ×8 IMPLANT
SUT MNCRL AB 3-0 PS2 18 (SUTURE) ×2 IMPLANT
SUT STRATAFIX 0 PDS 27 VIOLET (SUTURE) ×2
SUT STRATAFIX 1PDS 45CM VIOLET (SUTURE) ×2 IMPLANT
SUT STRATAFIX PDO 1 14 VIOLET (SUTURE) ×2
SUT STRATFX PDO 1 14 VIOLET (SUTURE) ×1
SUT VIC AB 1 CT1 36 (SUTURE) ×2 IMPLANT
SUT VIC AB 2-0 CT2 27 (SUTURE) ×4 IMPLANT
SUTURE STRATFX 0 PDS 27 VIOLET (SUTURE) ×1 IMPLANT
SUTURE STRATFX PDO 1 14 VIOLET (SUTURE) ×1 IMPLANT
SYR 50ML LL SCALE MARK (SYRINGE) ×2 IMPLANT
TRAY FOLEY MTR SLVR 14FR STAT (SET/KITS/TRAYS/PACK) IMPLANT
TRAY FOLEY MTR SLVR 16FR STAT (SET/KITS/TRAYS/PACK) ×1 IMPLANT
TUBE SUCTION HIGH CAP CLEAR NV (SUCTIONS) ×2 IMPLANT
UNDERPAD 30X36 HEAVY ABSORB (UNDERPADS AND DIAPERS) ×2 IMPLANT
VIVACIT E HIGHLY CROSSLINKED POLYETHYLENE ARTICULA (Orthopedic Implant) ×1 IMPLANT
WEDGE ARTHRO KNEE MED (Knees) ×1 IMPLANT
WEDGE FEM F/ARTHRO 9X5 (Miscellaneous) ×1 IMPLANT
WRAP KNEE MAXI GEL POST OP (GAUZE/BANDAGES/DRESSINGS) ×1 IMPLANT

## 2022-07-06 NOTE — Interval H&P Note (Signed)
The patient has been re-examined, and the chart reviewed, and there have been no interval changes to the documented history and physical.    Plan for left total knee revision arthroplasty for aseptic loosening.  Preop workup negative for infection including negative Synovasure aspiration.  Varus collapse secondary to aseptic loosening.  The operative side was examined and the patient was confirmed to have sensation to DPN, SPN, TN intact, Motor EHL, ext, flex 5/5, and DP 2+, PT 2+, No significant edema.   The risks, benefits, and alternatives have been discussed at length with patient, and the patient is willing to proceed.  Left knee marked. Consent has been signed.

## 2022-07-06 NOTE — Anesthesia Procedure Notes (Signed)
Anesthesia Regional Block: Adductor canal block   Pre-Anesthetic Checklist: , timeout performed,  Correct Patient, Correct Site, Correct Laterality,  Correct Procedure, Correct Position, site marked,  Risks and benefits discussed,  Surgical consent,  Pre-op evaluation,  At surgeon's request and post-op pain management  Laterality: Left  Prep: chloraprep       Needles:  Injection technique: Single-shot  Needle Type: Echogenic Needle     Needle Length: 9cm  Needle Gauge: 21     Additional Needles:   Procedures:,,,, ultrasound used (permanent image in chart),,    Narrative:  Start time: 07/06/2022 7:53 AM End time: 07/06/2022 7:59 AM Injection made incrementally with aspirations every 5 mL.  Performed by: Personally  Anesthesiologist: Santa Lighter, MD  Additional Notes: No pain on injection. No increased resistance to injection. Injection made in 5cc increments.  Good needle visualization.  Patient tolerated procedure well.

## 2022-07-06 NOTE — Evaluation (Signed)
Physical Therapy Evaluation Patient Details Name: Ronald Bond MRN: [id number] DOB: Apr 21, 1946 Today's Date: 07/06/2022  History of Present Illness  Pt is a 76yo male presenting s/p L total knee revision on 07/06/22. PMH: GERD, HTN, HLD, hypothyroidism, pre-diabetes, L-TKA 2009.   Clinical Impression  Ronald Bond is a 76 y.o. male POD 0 s/p L-TKR. Patient reports modified independence with mobility using SPC at baseline. Patient is now limited by functional impairments (see PT problem list below) and requires supervision for bed mobility and min guard for transfers. patient instructed in exercise to facilitate ROM and circulation to manage edema. Provided incentive spirometer and with Vcs pt able to achieve 1560m. Patient was able to ambulate 10 feet with RW and min guard level of assist; at end of ambulation pt reporting dizziness/flushing/nausea, directed pt to sit in recliner, BP 106/51, HR 38, SpO2 100%; RN notified. HR supine in bed prior to mobility 47. Further mobility deferred. Patient will benefit from continued skilled PT interventions to address impairments and progress towards PLOF. Acute PT will follow to progress mobility and stair training in preparation for safe discharge home.        Recommendations for follow up therapy are one component of a multi-disciplinary discharge planning process, led by the attending physician.  Recommendations may be updated based on patient status, additional functional criteria and insurance authorization.  Follow Up Recommendations Follow physician's recommendations for discharge plan and follow up therapies      Assistance Recommended at Discharge Intermittent Supervision/Assistance  Patient can return home with the following  A little help with walking and/or transfers;A little help with bathing/dressing/bathroom;Assistance with cooking/housework;Assist for transportation;Help with stairs or ramp for entrance    Equipment Recommendations  None recommended by PT  Recommendations for Other Services       Functional Status Assessment Patient has had a recent decline in their functional status and demonstrates the ability to make significant improvements in function in a reasonable and predictable amount of time.     Precautions / Restrictions Precautions Precautions: Fall Precaution Comments: Bradycardia (watch HR) Restrictions Weight Bearing Restrictions: Yes LLE Weight Bearing: Partial weight bearing LLE Partial Weight Bearing Percentage or Pounds: 50      Mobility  Bed Mobility Overal bed mobility: Needs Assistance Bed Mobility: Supine to Sit     Supine to sit: Supervision     General bed mobility comments: For safety only    Transfers Overall transfer level: Needs assistance Equipment used: Rolling walker (2 wheels) Transfers: Sit to/from Stand Sit to Stand: From elevated surface, Min guard           General transfer comment: for safety only, VCs for powering up through [place] Educated pt on 50%PWB on LLE and pt verbalized understandign.    Ambulation/Gait Ambulation/Gait assistance: Min guard Gait Distance (Feet): 10 Feet Assistive device: Rolling walker (2 wheels) Gait Pattern/deviations: Step-to pattern (with PWB 50% on LLE) Gait velocity: decreased     General Gait Details: Pt ambulated 153fwith RW and min guard, no physical assist or overt LOB noted. Pt reporting dizziness, eyes closed, flushing, nauseated; directed pt to sit in recliner with feet elevated, provided cool towel to neck and emesis bag, BP 106/51 HR 38, RN notified.  Further mobility deferred  Stairs            Wheelchair Mobility    Modified Rankin (Stroke Patients Only)       Balance Overall balance assessment: Needs assistance Sitting-balance support: Feet supported, No  upper extremity supported Sitting balance-Leahy Scale: Good     Standing balance support: During functional activity, Reliant on assistive  device for balance, Bilateral upper extremity supported Standing balance-Leahy Scale: Poor                               Pertinent Vitals/Pain Pain Assessment Pain Assessment: 0-10 Pain Score: 5  Pain Location: left knee Pain Descriptors / Indicators: Operative site guarding, Grimacing Pain Intervention(s): Limited activity within patient's tolerance, Monitored during session, Repositioned, Ice applied    Home Living Family/patient expects to be discharged to:: Private residence Living Arrangements: Spouse/significant other;Children Available Help at Discharge: Family;Available 24 hours/day Type of Home: House Home Access: Stairs to enter Entrance Stairs-Rails: Chemical engineer of Steps: 5   Home Layout: Able to live on main level with bedroom/bathroom Home Equipment: Conservation officer, nature (2 wheels);Cane - single point      Prior Function Prior Level of Function : Independent/Modified Independent             Mobility Comments: uses SPC during periods of high pain ADLs Comments: ind     Hand Dominance        Extremity/Trunk Assessment   Upper Extremity Assessment Upper Extremity Assessment: Overall WFL for tasks assessed    Lower Extremity Assessment Lower Extremity Assessment: RLE deficits/detail;LLE deficits/detail RLE Deficits / Details: MMT ank DF/PF 5/5 RLE Sensation: WNL LLE Deficits / Details: MMT ank DF/PF 5/5, no extensor lag noted LLE Sensation: WNL    Cervical / Trunk Assessment Cervical / Trunk Assessment: Normal  Communication   Communication: No difficulties  Cognition Arousal/Alertness: Awake/alert Behavior During Therapy: WFL for tasks assessed/performed Overall Cognitive Status: Within Functional Limits for tasks assessed                                          General Comments General comments (skin integrity, edema, etc.): Daughter and wife present    Exercises Total Joint Exercises Ankle  Circles/Pumps: AROM, Both, 10 reps   Assessment/Plan    PT Assessment Patient needs continued PT services  PT Problem List Decreased strength;Decreased range of motion;Decreased activity tolerance;Decreased balance;Decreased mobility;Decreased coordination;Decreased knowledge of use of DME;Pain       PT Treatment Interventions DME instruction;Gait training;Stair training;Functional mobility training;Therapeutic activities;Therapeutic exercise;Balance training;Neuromuscular re-education;Patient/family education    PT Goals (Current goals can be found in the Care Plan section)  Acute Rehab PT Goals 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 PT Goal Formulation: With patient Time For Goal Achievement: 07/13/22 Potential to Achieve Goals: Good    Frequency 7X/week     Co-evaluation               AM-PAC PT "6 Clicks" Mobility  Outcome Measure Help needed turning from your back to your side while in a flat bed without using bedrails?: None Help needed moving from lying on your back to sitting on the side of a flat bed without using bedrails?: A Little Help needed moving to and from a bed to a chair (including a wheelchair)?: A Little Help needed standing up from a chair using your arms (e.g., wheelchair or bedside chair)?: A Little Help needed to walk in hospital room?: A Little Help needed climbing 3-5 steps with a railing? : A Little 6 Click Score: 19    End of Session Equipment Utilized  During Treatment: Gait belt Activity Tolerance: Treatment limited secondary to medical complications (Comment) (Dizziness) Patient left: in chair;with call bell/phone within reach;with chair alarm set;with family/visitor present Nurse Communication: Mobility status PT Visit Diagnosis: Pain;Difficulty in walking, not elsewhere classified (R26.2) Pain - Right/Left: Left Pain - part of body: Knee    Time: 2080-2233 PT Time Calculation (min) (ACUTE ONLY): 32 min   Charges:   PT  Evaluation $PT Eval Low Complexity: 1 Low PT Treatments $Gait Training: 8-22 mins        Coolidge Breeze, PT, DPT [facility] Department Office: [phone] Pager: [phone]  Coolidge Breeze 07/06/2022, 5:40 PM

## 2022-07-06 NOTE — Progress Notes (Signed)
Patient HR in the low 40"s. Physician Dr Zachery Dakins made aware. Order obtained to hold BP meds until after further evaluation. Patient currently denies dizzyness.

## 2022-07-06 NOTE — Progress Notes (Signed)
Pacu RN Report to floor given  Gave report to  [employer]. Room:1340    Discussed surgery, meds given in OR and Pacu, VS, IV fluids given, EBL, urine output, pain and other pertinent information. Also discussed if pt had any family or friends here or belongings with them.   Discussed pt had xray, minimal pain, no flexion of L knee precautions/PWB, HR can run in 40s, wound vac working, foley cath, LR at 75 up. Coming in a bed.   Pt exits my care.

## 2022-07-06 NOTE — Discharge Instructions (Addendum)
Information on my medicine - ELIQUIS (apixaban)  Why was Eliquis prescribed for you? Eliquis was prescribed for you to reduce the risk of blood clots forming after orthopedic surgery.    What do You need to know about Eliquis? Take your Eliquis TWICE DAILY - one tablet in the morning and one tablet in the evening with or without food.  It would be best to take the dose about the same time each day.  If you have difficulty swallowing the tablet whole please discuss with your pharmacist how to take the medication safely.  Take Eliquis exactly as prescribed by your doctor and DO NOT stop taking Eliquis without talking to the doctor who prescribed the medication.  Stopping without other medication to take the place of Eliquis may increase your risk of developing a clot.  After discharge, you should have regular check-up appointments with your healthcare provider that is prescribing your Eliquis.  What do you do if you miss a dose? If a dose of ELIQUIS is not taken at the scheduled time, take it as soon as possible on the same day and twice-daily administration should be resumed.  The dose should not be doubled to make up for a missed dose.  Do not take more than one tablet of ELIQUIS at the same time.  Important Safety Information A possible side effect of Eliquis is bleeding. You should call your healthcare provider right away if you experience any of the following: Bleeding from an injury or your nose that does not stop. Unusual colored urine (red or dark brown) or unusual colored stools (red or black). Unusual bruising for unknown reasons. A serious fall or if you hit your head (even if there is no bleeding).  Some medicines may interact with Eliquis and might increase your risk of bleeding or clotting while on Eliquis. To help avoid this, consult your healthcare provider or pharmacist prior to using any new prescription or non-prescription medications, including herbals,  vitamins, non-steroidal anti-inflammatory drugs (NSAIDs) and supplements.  This website has more information on Eliquis (apixaban): http://www.eliquis.com/eliquis/home    INSTRUCTIONS AFTER JOINT REPLACEMENT   Remove items at home which could result in a fall. This includes throw rugs or furniture in walking pathways ICE to the affected joint every three hours while awake for 30 minutes at a time, for at least the first 3-5 days, and then as needed for pain and swelling.  Continue to use ice for pain and swelling. You may notice swelling that will progress down to the foot and ankle.  This is normal after surgery.  Elevate your leg when you are not up walking on it.   Continue to use the breathing machine you got in the hospital (incentive spirometer) which will help keep your temperature down.  It is common for your temperature to cycle up and down following surgery, especially at night when you are not up moving around and exerting yourself.  The breathing machine keeps your lungs expanded and your temperature down.   DIET:  As you were doing prior to hospitalization, we recommend a well-balanced diet.  DRESSING / WOUND CARE / SHOWERING  Keep the surgical dressing until follow up.  The dressing is water proof, so you can shower without any extra covering.  IF THE DRESSING FALLS OFF or the wound gets wet inside, change the dressing with sterile gauze.  Please use good hand washing techniques before changing the dressing.  Do not use any lotions or creams on the incision until  instructed by your surgeon.    ACTIVITY  Increase activity slowly as tolerated, but follow the weight bearing instructions below.   No driving for 6 weeks or until further direction given by your physician.  You cannot drive while taking narcotics.  No lifting or carrying greater than 10 lbs. until further directed by your surgeon. Avoid periods of inactivity such as sitting longer than an hour when not asleep. This  helps prevent blood clots.  You may return to work once you are authorized by your doctor.     WEIGHT BEARING   Weight bearing as tolerated with assist device (walker, cane, etc) as directed, use it as long as suggested by your surgeon or therapist, typically at least 4-6 weeks.   EXERCISES  Results after joint replacement surgery are often greatly improved when you follow the exercise, range of motion and muscle strengthening exercises prescribed by your doctor. Safety measures are also important to protect the joint from further injury. Any time any of these exercises cause you to have increased pain or swelling, decrease what you are doing until you are comfortable again and then slowly increase them. If you have problems or questions, call your caregiver or physical therapist for advice.   Rehabilitation is important following a joint replacement. After just a few days of immobilization, the muscles of the leg can become weakened and shrink (atrophy).  These exercises are designed to build up the tone and strength of the thigh and leg muscles and to improve motion. Often times heat used for twenty to thirty minutes before working out will loosen up your tissues and help with improving the range of motion but do not use heat for the first two weeks following surgery (sometimes heat can increase post-operative swelling).   These exercises can be done on a training (exercise) mat, on the floor, on a table or on a bed. Use whatever works the best and is most comfortable for you.    Use music or television while you are exercising so that the exercises are a pleasant break in your day. This will make your life better with the exercises acting as a break in your routine that you can look forward to.   Perform all exercises about fifteen times, three times per day or as directed.  You should exercise both the operative leg and the other leg as well.  Exercises include:   Quad Sets - Tighten up the  muscle on the front of the thigh (Quad) and hold for 5-10 seconds.   Straight Leg Raises - With your knee straight (if you were given a brace, keep it on), lift the leg to 60 degrees, hold for 3 seconds, and slowly lower the leg.  Perform this exercise against resistance later as your leg gets stronger.  Leg Slides: Lying on your back, slowly slide your foot toward your buttocks, bending your knee up off the floor (only go as far as is comfortable). Then slowly slide your foot back down until your leg is flat on the floor again.  Angel Wings: Lying on your back spread your legs to the side as far apart as you can without causing discomfort.  Hamstring Strength:  Lying on your back, push your heel against the floor with your leg straight by tightening up the muscles of your buttocks.  Repeat, but this time bend your knee to a comfortable angle, and push your heel against the floor.  You may put a pillow under the heel  to make it more comfortable if necessary.   A rehabilitation program following joint replacement surgery can speed recovery and prevent re-injury in the future due to weakened muscles. Contact your doctor or a physical therapist for more information on knee rehabilitation.    CONSTIPATION  Constipation is defined medically as fewer than three stools per week and severe constipation as less than one stool per week.  Even if you have a regular bowel pattern at home, your normal regimen is likely to be disrupted due to multiple reasons following surgery.  Combination of anesthesia, postoperative narcotics, change in appetite and fluid intake all can affect your bowels.   YOU MUST use at least one of the following options; they are listed in order of increasing strength to get the job done.  They are all available over the counter, and you may need to use some, POSSIBLY even all of these options:    Drink plenty of fluids (prune juice may be helpful) and high fiber foods Colace 100 mg by  mouth twice a day  Senokot for constipation as directed and as needed Dulcolax (bisacodyl), take with full glass of water  Miralax (polyethylene glycol) once or twice a day as needed.  If you have tried all these things and are unable to have a bowel movement in the first 3-4 days after surgery call either your surgeon or your primary doctor.    If you experience loose stools or diarrhea, hold the medications until you stool forms back up.  If your symptoms do not get better within 1 week or if they get worse, check with your doctor.  If you experience "the worst abdominal pain ever" or develop nausea or vomiting, please contact the office immediately for further recommendations for treatment.   ITCHING:  If you experience itching with your medications, try taking only a single pain pill, or even half a pain pill at a time.  You can also use Benadryl over the counter for itching or also to help with sleep.   TED HOSE STOCKINGS:  Use stockings on both legs until for at least 2 weeks or as directed by physician office. They may be removed at night for sleeping.  MEDICATIONS:  See your medication summary on the "After Visit Summary" that nursing will review with you.  You may have some home medications which will be placed on hold until you complete the course of blood thinner medication.  It is important for you to complete the blood thinner medication as prescribed.   Blood clot prevention (DVT Prophylaxis): After surgery you are at an increased risk for a blood clot. you were prescribed a blood thinner, eliquis, to be taken twice daily for a total of 4 weeks from surgery to help reduce your risk of getting a blood clot. This will help prevent a blood clot. Signs of a pulmonary embolus (blood clot in the lungs) include sudden short of breath, feeling lightheaded or dizzy, chest pain with a deep breath, rapid pulse rapid breathing. Signs of a blood clot in your arms or legs include new unexplained  swelling and cramping, warm, red or darkened skin around the painful area. Please call the office or 911 right away if these signs or symptoms develop.  PRECAUTIONS:  If you experience chest pain or shortness of breath - call 911 immediately for transfer to the hospital emergency department.   If you develop a fever greater that 101 F, purulent drainage from wound, increased redness or drainage from  wound, foul odor from the wound/dressing, or calf pain - CONTACT YOUR SURGEON.                                                   FOLLOW-UP APPOINTMENTS:  If you do not already have a post-op appointment, please call the office for an appointment to be seen by your surgeon.  Will see you back in 1 week after surgery for follow up.  OTHER INSTRUCTIONS:   Knee Replacement:  Do not place pillow under knee, focus on keeping the knee straight while resting. CPM instructions: 0-90 degrees, 2 hours in the morning, 2 hours in the afternoon, and 2 hours in the evening. Place foam block, curve side up under heel at all times except when in CPM or when walking.  DO NOT modify, tear, cut, or change the foam block in any way.  POST-OPERATIVE OPIOID TAPER INSTRUCTIONS: It is important to wean off of your opioid medication as soon as possible. If you do not need pain medication after your surgery it is ok to stop day one. Opioids include: Codeine, Hydrocodone(Norco, Vicodin), Oxycodone(Percocet, oxycontin) and hydromorphone amongst others.  Long term and even short term use of opiods can cause: Increased pain response Dependence Constipation Depression Respiratory depression And more.  Withdrawal symptoms can include Flu like symptoms Nausea, vomiting And more Techniques to manage these symptoms Hydrate well Eat regular healthy meals Stay active Use relaxation techniques(deep breathing, meditating, yoga) Do Not substitute Alcohol to help with tapering If you have been on opioids for less than two weeks  and do not have pain than it is ok to stop all together.  Plan to wean off of opioids This plan should start within one week post op of your joint replacement. Maintain the same interval or time between taking each dose and first decrease the dose.  Cut the total daily intake of opioids by one tablet each day Next start to increase the time between doses. The last dose that should be eliminated is the evening dose.   MAKE SURE YOU:  Understand these instructions.  Get help right away if you are not doing well or get worse.    Thank you for letting us be a part of your medical care team.  It is a privilege we respect greatly.  We hope these instructions will help you stay on track for a fast and full recovery!

## 2022-07-06 NOTE — Anesthesia Procedure Notes (Signed)
Spinal  Patient location during procedure: OR End time: 07/06/2022 8:35 AM Reason for block: surgical anesthesia Staffing Performed: resident/CRNA  Resident/CRNA: Noralyn Pick D, CRNA Performed by: Katilynn Sinkler, Clinical cytogeneticist D, CRNA Authorized by: Santa Lighter, MD   Preanesthetic Checklist Completed: patient identified, IV checked, site marked, risks and benefits discussed, surgical consent, monitors and equipment checked, pre-op evaluation and timeout performed Spinal Block Patient position: sitting Prep: DuraPrep Patient monitoring: heart rate, continuous pulse ox and blood pressure Approach: midline Location: L3-4 Injection technique: single-shot Needle Needle type: Pencan  Needle gauge: 24 G Needle length: 9 cm Assessment Sensory level: T6 Events: CSF return

## 2022-07-06 NOTE — Op Note (Signed)
DATE OF SURGERY:  07/06/2022 TIME: 1:01 PM  PATIENT NAME:  Ronald Bond   AGE: 76 y.o.    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  Metal-backed patella well-fixed.  Mild wear to the patella polyethylene.  PROCEDURE: Left total Knee Arthroplasty  SURGEON:  Zarai Orsborn A Elzie Knisley, MD   ASSISTANT: Izola Price, RNFA, present and scrubbed throughout the case, critical for assistance with exposure, retraction, instrumentation, and closure.   OPERATIVE IMPLANTS:  Cemented revision Zimmer persona, 9+ revision femur with 135 mm x 18 mm press-fit splined stem, distal augments 5 medial 10 lateral, posterior augment 5 lateral.  Medium trabecular metal femoral central cone, extra small tibial central cone, size F revision femur baseplate 10 mm augment medial 5 mm augment lateral, 16 x 135 mm splined stem with 30 mm offset, 10 mm CCK liner.  LCS complete 3 peg patella poly replacement size large+ Implant Name Type Inv. Item Serial No. Manufacturer Lot No. LRB No. Used Action  CEMENT BONE REFOBACIN R1X40 [place] - [id number] Cement CEMENT BONE REFOBACIN R1X40 [place]  ZIMMER RECON(ORTH,TRAU,BIO,SG) [id number] Left 1 Implanted  CEMENT BONE REFOBACIN R1X40 [place] - [id number] Cement CEMENT BONE REFOBACIN R1X40 [place]  ZIMMER RECON(ORTH,TRAU,BIO,SG) [id number] Left 1 Implanted  CEMENT BONE REFOBACIN R1X40 [place] - [id number] Cement CEMENT BONE REFOBACIN R1X40 [place]  ZIMMER RECON(ORTH,TRAU,BIO,SG) [id number] Left 1 Implanted  STEM STRT SPLINE EXT PS 18X135 - [id number] Stem STEM STRT SPLINE EXT PS 18X135  ZIMMER RECON(ORTH,TRAU,BIO,SG) [id number] Left 1 Implanted  COMP FEM TI MET KNEE 9 - [id number] Knees COMP FEM TI MET KNEE 9  ZIMMER RECON(ORTH,TRAU,BIO,SG) [id number] Left 1 Implanted  WEDGE ARTHRO KNEE MED - [id number] Knees WEDGE ARTHRO KNEE MED  ZIMMER  RECON(ORTH,TRAU,BIO,SG) [id number] Left 1 Implanted  WEDGE FEM F/ARTHRO 9X5 - [id number] Miscellaneous WEDGE FEM F/ARTHRO 9X5  ZIMMER RECON(ORTH,TRAU,BIO,SG) [id number] Left 1 Implanted  AUG FEM DIST KNEE 9/9+ 10 - [id number] Joint AUG FEM DIST KNEE 9/9+ 10  ZIMMER RECON(ORTH,TRAU,BIO,SG) [id number] Left 1 Implanted  AUGMENT PSN FEM DIST SZ9 5 - [id number] Miscellaneous AUGMENT PSN FEM DIST SZ9 5  ZIMMER RECON(ORTH,TRAU,BIO,SG) [id number] Left 1 Implanted  STEM FEM OFFSET 004H99H7 - [id number] Stem STEM FEM OFFSET 023X43H6  ZIMMER RECON(ORTH,TRAU,BIO,SG) [id number] Left 1 Implanted  INSERT TIB PS CMTLS CC XSM - [id number] Insert INSERT TIB PS CMTLS CC XSM  ZIMMER RECON(ORTH,TRAU,BIO,SG) [id number] Left 1 Implanted  COMPONENT PSN TIB FIX SZ F LT - [id number] Miscellaneous COMPONENT PSN TIB FIX SZ F LT  ZIMMER RECON(ORTH,TRAU,BIO,SG) [id number] Left 1 Implanted  AUGMENT TIB TI 5 SZ EF OUTSIDE - [id number] Joint AUGMENT TIB TI 5 SZ EF OUTSIDE  ZIMMER RECON(ORTH,TRAU,BIO,SG) [id number] Left 1 Implanted  AUG TIB HALF BLOCK PS EF LM 10 - [id number] Joint AUG TIB HALF BLOCK PS EF LM 10  ZIMMER RECON(ORTH,TRAU,BIO,SG) [id number] Left 1 Implanted  VIVACIT E HIGHLY CROSSLINKED POLYETHYLENE ARTICULAR SURFACE Orthopedic Implant   ZIMMER KNEE [id number] Left 1 Implanted  LCS COMPLETE 3PEG PATELLA METAL BACKED POLY REPLACEMENT Orthopedic Implant   DEPUY SYNTHES [id number] Left 1 Implanted      PREOPERATIVE INDICATIONS:  Ronald Bond is a 76 y.o. year old male who had undergone total knee arthroplasty for osteoarthritis by Dr. French Ana in 2008.  He had his progressive worsening pain and dysfunction over the past couple of years.  He has also had a worsening varus deformity.  Preoperative workup was concerning  for loosening of his tibial component as well as osteolysis around the femoral component.  Aspiration preoperatively demonstrated negative alpha defense and negative cultures with low white blood cell count reassuring against infection.   Given the concern for aseptic loosening the patient was recommended revision total knee arthroplasty. The risks, benefits, and alternatives were discussed at length including but not limited to the risks of infection, bleeding, nerve injury, stiffness, blood clots, the need for revision surgery, cardiopulmonary complications, among others, and they were willing to proceed.  OPERATIVE FINDINGS AND UNIQUE ASPECTS OF THE CASE: Significant bone loss on the lateral femoral condyle as is well as medial tibia.  Good fixation with press-fit stems and cones.  ESTIMATED BLOOD LOSS: 250cc   OPERATIVE DESCRIPTION: Once adequate anesthesia, preoperative antibiotics, 2 gm of Ancef,1 gm of Tranexamic Acid, and 8 g of Decadron were given; the patient was positioned supine with a thigh tourniquet placed.  The left lower extremity was prepped and draped in sterile fashion.  A time-  out was performed identifying the patient, planned procedure, and the appropriate extremity.     The leg was  exsanguinated, tourniquet elevated to 250 mmHg.  A midline incision was made utilizing his old scar.  A medial parapatellar arthrotomy was performed.  Severe synovitis consistent with polyethylene wear was present throughout the knee.  No evidence of purulence or infection.  A circumferential synovectomy was performed.  3 synovial tissue specimens were sent from the infrapatellar, lateral, medial gutters.  These were sent for aerobic anaerobic cultures.  We next performed a large medial release off the tibial plateau with Bovie cautery, to the posterior medial aspect of the tibia..   Suprapatellar fat pad was resected.  Soft tissue at the bone implant interface was resected.  On the femoral side significant osteolysis was noted circumferentially.  The RP and liner of the tibial component was removed.  Once it was removed the tibial component was grossly loose.  Using a small osteotome of the polyethylene liner was removed.  Significant  damage to the periphery of the medial side of the liner with delamination oxidation changes.  There was gross polyethylene debris throughout the knee.   Patellar component was assessed and the polyethylene piece was removed.  There was mild wear along the medial edge of the component.  The metal-backed component of the patella was exposed circumferentially.  This appeared to still be well fixed without damage.   We next turned our attention to explanting the femoral and tibial components.  The peripheral edge was exposed circumferentially around the femoral component using Bovie cautery and a rongeur.  Next using quarter inch straight osteotome and a small ACL blade saw.  We were able to break up the implant cement interface circumferentially.  Next using a bone tamp and mallet we were able to easily remove the femoral component.  Once the femoral component was removed there was a large defect noted in the lateral femoral condyle.  Soft tissue from this defect was also sent for a aerobic anaerobic culture.  There was only a shell of bone left supporting the lateral epicondyle.  The medial condyle overall looked pretty good and felt that would be supportive for our femoral component.  Given the extensive lateral condyle bone loss plan to use a cone for metaphyseal fixation and stability.   Next we turned our attention to explanting the tibial component.  The tibial component was grossly loose and could be removed by hand.  There was no  cement attached to the tibial component.  The cement was well-fixed to the tibia bone itself however.  We next turned our attention to removing some of the cement from the tibia to expose the canal using gouges and a scoop from the [facility] revision system.  Using Revere reverse curettes both canals of the femur and tibia were thoroughly debrided..      We next turned our attention to preparing the femoral and and tibial canal for a 135 mm press-fit stem.  We sequentially reamed  up to 18 mm on the femur and 16 mm on the tibia which had good cortical fit.Marland Kitchen   Next we turned our attention to tibial preparation.  We then performed a freshen up cut of the proximal tibia at 0 degrees.  There was significant medial bone loss especially posteriorly.  1 cm resection we felt we were very low laterally at about the level of the tibial tubercle and felt to be could not take additional bone medially.  However there is still some defect medially and felt that we could improve stability medially with a extra 5 mm Hemi augment.  We had good bony contact laterally.  We then sized the tibial component and relative to the tibial canal the baseplate sat slightly anterior and lateral.  We used a 3 mm offset and were able to bring the baseplate posterior and medial  A size F tibial tray had good coverage of the tibial bone surface.  Given the bone loss we elected to prepare for a tibial cone fracture metaphyseal fixation.   We broached sequentially up to an extra small cone which had good fit and rotational stability.  The tibial trial was assembled impacted into place.  Using a guide on the tibial trial we cut an additional 5 mm of bone medially for an extra 73m Hemi augment.     We performed a freshen up cut of the distal femur and had had good distal bone medially through the 5 mm cut guide.  Laterally there was extensive bone loss.  We cut through the 10 mm cut line and still did not have good distal bone for fixation. Next we turned our attention to femoral sizing.  We felt that the prior implant was oversized and was relatively comparable to an 11.  Elected to use a 9 femur.  This was placed over the canal guide and had good position medially laterally anterior posterior without any offset.  An angel wing was used to confirm proper rotation relative to the transepicondylar axis.  The 4-in-1 cut guide was pinned into place.  Through the cut guide we had good posterior bone purchase medially through  this a 0 mm line and laterally through the 5 mm cut..Marland Kitchen  Next we carefully using Bovie cautery did a posterior capsule release to help improve extension.  Next we prepared the femoral canal for a cone.  A medium cone had good metaphyseal purchase and rotational stability.  Trial components were both inserted on the femur and tibia.  I felt we had appropriately restore the joint line as the medial epicondyle measured about 30 mm to the end of the trial implant, and lateral epicondyle measured about 25 mm the end of the implant.  Inferior pole the patella lined up with the distal aspect of the femoral implant.  With our initial trials we only had a 5 mm augment on the tibia medially, no augment laterally.  With the PS trial we felt we had  slight hyperextension and laxity in both extension and flexion.  There was significant laxity laterally given the shell of the lateral condyle bone remaining felt that the lateral ligaments were not providing much support or stability.  Given that we had some laxity in both flexion extension and a additional 5 mm augment on the tibia so we now had 10 mm medial 5 mm lateral.  We also felt we had some increased laxity in flexion relative to extension so a plus femur was utilized.   With a 10 mm PS poly we had full extension and good stability with varus valgus stress with about 1 mm opening medially, but about 4 to 5 mm opening laterally.  And good stability in flexion as well with about 10m anterior to posterior translation.   During this time the tourniquet which was at 113 minutes was deflated, and left down for 30 minutes.     The real components were then opened and assembled on the back table.  The knee was thoroughly irrigated with pulse lavage saline as well as Betadine irrigation.  The Betadine was irrigated out with another liter of pulse lavage. The tourniquet was reinflated for cementing.The tibial and femoral components were cemented using 3 batches antibiotic  cement given the large defect on the medial side..  Felt we had good press-fit with our stems. The knee was irrigated with normal saline pulse lavage.  The synovial lining was  then injected a dilute Exparel.  The knee was held in extension until the cement was fully set. We again checked stability and felt 10 mm insert provided good's extension, stability in flexion but relative laxity especially laterally.  I felt the CCK insert would be best utilized to provide varus valgus stability in extension.  The real polyethylene liner was then opened and inserted.  The locking bolt was secured and tightened to the appropriate tension.  At this point we were pleased with our flexion extension gaps and range of motion, and patellar tracking was excellent.  A replacement polyethylene plastic was inserted and snapped into place onto the metal-backed patella.         The tourniquet was again let down.  No significant  hemostasis was required.  The medial parapatellar arthrotomy was then reapproximated using #1 Vicryl and #1 Stratafix sutures with the knee in 45 degrees of flexion.  The  remaining wound was closed with 2-0 Vicryl, and interrupted 3-0 Nylon  The knee was cleaned, dried, dressed with a Prevena incisional wound VAC.  The patient was then  brought to recovery room in stable condition, tolerating the procedure  well. There were no complications.   Post op recs: WB: 50% PWB Abx: ancef while in house discharge with 7 days cefadroxil 500 twice daily Imaging: PACU xrays DVT prophylaxis: Eliquis 2.5 mg twice daily starting postop day 1 x 4 weeks given history of DVT Follow up: 1 week after surgery for a wound check [facility]  [address] [place] [address] Office Phone: [phone] DCharlies Constable MD Orthopaedic Surgery

## 2022-07-06 NOTE — Transfer of Care (Signed)
Immediate Anesthesia Transfer of Care Note  Patient: Ronald Bond  Procedure(s) Performed: TOTAL KNEE REVISION (Left: Knee)  Patient Location: PACU  Anesthesia Type:Regional and Spinal  Level of Consciousness: awake, alert  and oriented  Airway & Oxygen Therapy: Patient Spontanous Breathing and Patient connected to face mask oxygen  Post-op Assessment: Report given to RN and Post -op Vital signs reviewed and stable  Post vital signs: Reviewed and stable  Last Vitals:  Vitals Value Taken Time  BP 121/67 07/06/22 1301  Temp    Pulse 55 07/06/22 1305  Resp 12 07/06/22 1305  SpO2 100 % 07/06/22 1305  Vitals shown include unvalidated device data.  Last Pain:  Vitals:   07/06/22 0633  TempSrc:   PainSc: 0-No pain      Patients Stated Pain Goal: 5 (96/78/93 8101)  Complications: No notable events documented.

## 2022-07-06 NOTE — Anesthesia Postprocedure Evaluation (Signed)
Anesthesia Post Note  Patient: Ronald Bond  Procedure(s) Performed: TOTAL KNEE REVISION (Left: Knee)     Patient location during evaluation: PACU Anesthesia Type: Spinal Level of consciousness: awake, awake and alert and oriented Pain management: pain level controlled Vital Signs Assessment: post-procedure vital signs reviewed and stable Respiratory status: spontaneous breathing, nonlabored ventilation and respiratory function stable Cardiovascular status: blood pressure returned to baseline and stable Postop Assessment: no headache, no backache, spinal receding and no apparent nausea or vomiting Anesthetic complications: no   No notable events documented.  Last Vitals:  Vitals:   07/06/22 1300 07/06/22 1340  BP:    Pulse:  (!) 56  Resp:  11  Temp: (!) 36.1 C   SpO2:  98%    Last Pain:  Vitals:   07/06/22 1340  TempSrc:   PainSc: 1     LLE Motor Response: Purposeful movement;Responds to commands (07/06/22 1340) LLE Sensation: Decreased (07/06/22 1340) RLE Motor Response: Purposeful movement;Responds to commands (07/06/22 1340) RLE Sensation: Decreased (07/06/22 1340)   R Sensory Level: S1-Sole of foot, small toes (07/06/22 1340)  Santa Lighter

## 2022-07-06 NOTE — Progress Notes (Signed)
Orthopedic Tech Progress Note Patient Details:  [name] Dec 08, 1945 [id number]  Patient ID: Ronald Bond, male   DOB: February 21, 1946, 76 y.o.   MRN: [id number]  Kennis Carina 07/06/2022, 3:19 PM Bone faom applied to left leg

## 2022-07-07 ENCOUNTER — Encounter (HOSPITAL_COMMUNITY): Payer: Self-pay | Admitting: Orthopedic Surgery

## 2022-07-07 LAB — BASIC METABOLIC PANEL
Anion gap: 10 (ref 5–15)
BUN: 34 mg/dL — ABNORMAL HIGH (ref 8–23)
CO2: 23 mmol/L (ref 22–32)
Calcium: 8.4 mg/dL — ABNORMAL LOW (ref 8.9–10.3)
Chloride: 102 mmol/L (ref 98–111)
Creatinine, Ser: 1.87 mg/dL — ABNORMAL HIGH (ref 0.61–1.24)
GFR, Estimated: 37 mL/min — ABNORMAL LOW (ref >60)
Glucose, Bld: 133 mg/dL — ABNORMAL HIGH (ref 70–99)
Potassium: 3.9 mmol/L (ref 3.5–5.1)
Sodium: 135 mmol/L (ref 135–145)

## 2022-07-07 LAB — CBC
HCT: 29.4 % — ABNORMAL LOW (ref 39.0–52.0)
Hemoglobin: 9.4 g/dL — ABNORMAL LOW (ref 13.0–17.0)
MCH: 29.5 pg (ref 26.0–34.0)
MCHC: 32 g/dL (ref 30.0–36.0)
MCV: 92.2 fL (ref 80.0–100.0)
Platelets: 183 10*3/uL (ref 150–400)
RBC: 3.19 MIL/uL — ABNORMAL LOW (ref 4.22–5.81)
RDW: 14.2 % (ref 11.5–15.5)
WBC: 12 10*3/uL — ABNORMAL HIGH (ref 4.0–10.5)
nRBC: 0 % (ref 0.0–0.2)

## 2022-07-07 LAB — GLUCOSE, CAPILLARY
Glucose-Capillary: 112 mg/dL — ABNORMAL HIGH (ref 70–99)
Glucose-Capillary: 91 mg/dL (ref 70–99)
Glucose-Capillary: 106 mg/dL — ABNORMAL HIGH (ref 70–99)
Glucose-Capillary: 113 mg/dL — ABNORMAL HIGH (ref 70–99)

## 2022-07-07 MED ORDER — ACETAMINOPHEN 10 MG/ML IV SOLN
INTRAVENOUS | Status: DC
Start: 2022-07-07 — End: 2022-07-07
  Filled 2022-07-07: qty 100

## 2022-07-07 NOTE — Progress Notes (Signed)
Physical Therapy Treatment Patient Details Name: Ronald Bond MRN: [id number] DOB: [birth year] Today's Date: 07/07/2022   History of Present Illness Pt is a 76yo male presenting s/p L total knee revision on 07/06/22. PMH: GERD, HTN, HLD, hypothyroidism, pre-diabetes, L-TKA 2009.    PT Comments    Pt is progressing well with mobility, he ambulated 150' with RW, no loss of balance. 7/10 L knee pain with walking. Will plan to do stair training tomorrow.    Recommendations for follow up therapy are one component of a multi-disciplinary discharge planning process, led by the attending physician.  Recommendations may be updated based on patient status, additional functional criteria and insurance authorization.  Follow Up Recommendations  Follow physician's recommendations for discharge plan and follow up therapies     Assistance Recommended at Discharge Intermittent Supervision/Assistance  Patient can return home with the following A little help with walking and/or transfers;A little help with bathing/dressing/bathroom;Assistance with cooking/housework;Assist for transportation;Help with stairs or ramp for entrance   Equipment Recommendations  None recommended by PT    Recommendations for Other Services       Precautions / Restrictions Precautions Precautions: Fall;Knee Precaution Booklet Issued: Yes (comment) Precaution Comments: Bradycardia (watch HR); reviewed no pillow under knee Restrictions Weight Bearing Restrictions: Yes LLE Weight Bearing: Partial weight bearing LLE Partial Weight Bearing Percentage or Pounds: 50     Mobility  Bed Mobility Overal bed mobility: Modified Independent Bed Mobility: Supine to Sit     Supine to sit: Modified independent (Device/Increase time), HOB elevated     General bed mobility comments: used rail    Transfers Overall transfer level: Needs assistance Equipment used: Rolling walker (2 wheels) Transfers: Sit to/from Stand Sit to  Stand: From elevated surface, Supervision           General transfer comment: VCs for hand placement. Educated pt on 50%PWB on LLE and pt verbalized understanding.    Ambulation/Gait Ambulation/Gait assistance: Supervision Gait Distance (Feet): 160 Feet Assistive device: Rolling walker (2 wheels) Gait Pattern/deviations: Step-to pattern, Decreased weight shift to left Gait velocity: decreased     General Gait Details: steady, no loss of balance   Stairs             Wheelchair Mobility    Modified Rankin (Stroke Patients Only)       Balance Overall balance assessment: Needs assistance Sitting-balance support: Feet supported, No upper extremity supported Sitting balance-Leahy Scale: Good     Standing balance support: During functional activity, Reliant on assistive device for balance, Bilateral upper extremity supported Standing balance-Leahy Scale: Fair                              Cognition Arousal/Alertness: Awake/alert Behavior During Therapy: WFL for tasks assessed/performed Overall Cognitive Status: Within Functional Limits for tasks assessed                                          Exercises Total Joint Exercises Ankle Circles/Pumps: AROM, Both, 10 reps, Supine  Long Arc Quad: AROM, Left, Seated, 10 reps Knee Flexion: AAROM, Left, 10 reps, Seated Goniometric ROM: 5-65* AAROM L knee    General Comments        Pertinent Vitals/Pain Pain Assessment Pain Score: 7  Pain Location: left knee with walking Pain Descriptors / Indicators: Aching Pain Intervention(s): Limited activity within  patient's tolerance, Monitored during session, Premedicated before session, Ice applied    Home Living                          Prior Function            PT Goals (current goals can now be found in the care plan section) Acute Rehab PT Goals 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, do handyman work PT Goal Formulation:  With patient Time For Goal Achievement: 07/13/22 Potential to Achieve Goals: Good Progress towards PT goals: Progressing toward goals    Frequency    7X/week      PT Plan Current plan remains appropriate    Co-evaluation              AM-PAC PT "6 Clicks" Mobility   Outcome Measure  Help needed turning from your back to your side while in a flat bed without using bedrails?: None Help needed moving from lying on your back to sitting on the side of a flat bed without using bedrails?: A Little Help needed moving to and from a bed to a chair (including a wheelchair)?: A Little Help needed standing up from a chair using your arms (e.g., wheelchair or bedside chair)?: A Little Help needed to walk in hospital room?: None Help needed climbing 3-5 steps with a railing? : A Little 6 Click Score: 20    End of Session Equipment Utilized During Treatment: Gait belt Activity Tolerance: Patient tolerated treatment well Patient left: in chair;with call bell/phone within reach;with chair alarm set Nurse Communication: Mobility status PT Visit Diagnosis: Pain;Difficulty in walking, not elsewhere classified (R26.2) Pain - Right/Left: Left Pain - part of body: Knee     Time: 1657-9038 PT Time Calculation (min) (ACUTE ONLY): 23 min  Charges:  $Gait Training: 8-22 mins $Therapeutic Exercise: 8-22 mins                     Blondell Reveal Kistler PT 07/07/2022  [place]  Office [phone]

## 2022-07-07 NOTE — Progress Notes (Addendum)
Physical Therapy Treatment Patient Details Name: Ronald Bond MRN: [id number] DOB: 1946-07-04 Today's Date: 07/07/2022   History of Present Illness Pt is a 76yo male presenting s/p L total knee revision on 07/06/22. PMH: GERD, HTN, HLD, hypothyroidism, pre-diabetes, L-TKA 2009.    PT Comments    Pt ambulated 140' with RW, no loss of balance. Instructed pt in TKA HEP, he demonstrates good understanding. SLR -3/5, L knee AAROM 5-65*.  He is progressing well with mobility.   Recommendations for follow up therapy are one component of a multi-disciplinary discharge planning process, led by the attending physician.  Recommendations may be updated based on patient status, additional functional criteria and insurance authorization.  Follow Up Recommendations  Follow physician's recommendations for discharge plan and follow up therapies     Assistance Recommended at Discharge Intermittent Supervision/Assistance  Patient can return home with the following A little help with walking and/or transfers;A little help with bathing/dressing/bathroom;Assistance with cooking/housework;Assist for transportation;Help with stairs or ramp for entrance   Equipment Recommendations  None recommended by PT    Recommendations for Other Services       Precautions / Restrictions Precautions Precautions: Fall Precaution Comments: Bradycardia (watch HR); knee (reviewed no pillow under knee) Restrictions Weight Bearing Restrictions: Yes LLE Weight Bearing: Partial weight bearing LLE Partial Weight Bearing Percentage or Pounds: 50     Mobility  Bed Mobility Overal bed mobility: Modified Independent Bed Mobility: Supine to Sit     Supine to sit: Modified independent (Device/Increase time), HOB elevated     General bed mobility comments: used rail    Transfers Overall transfer level: Needs assistance Equipment used: Rolling walker (2 wheels) Transfers: Sit to/from Stand Sit to Stand: Min guard, From  elevated surface           General transfer comment: for safety only, VCs for hand placement. Educated pt on 50%PWB on LLE and pt verbalized understanding.    Ambulation/Gait Ambulation/Gait assistance: Min guard Gait Distance (Feet): 140 Feet   Gait Pattern/deviations: Step-to pattern, Decreased weight shift to left Gait velocity: decreased     General Gait Details: steady, no loss of balance   Stairs             Wheelchair Mobility    Modified Rankin (Stroke Patients Only)       Balance Overall balance assessment: Needs assistance Sitting-balance support: Feet supported, No upper extremity supported Sitting balance-Leahy Scale: Good     Standing balance support: During functional activity, Reliant on assistive device for balance, Bilateral upper extremity supported Standing balance-Leahy Scale: Fair                              Cognition Arousal/Alertness: Awake/alert Behavior During Therapy: WFL for tasks assessed/performed Overall Cognitive Status: Within Functional Limits for tasks assessed                                          Exercises Total Joint Exercises Ankle Circles/Pumps: AROM, Both, 10 reps, Supine Quad Sets: AROM, Left, 5 reps, Supine Short Arc Quad: AROM, Left, 5 reps, Supine Heel Slides: AAROM, Left, 5 reps, Supine Hip ABduction/ADduction: AAROM, Left, 5 reps, Supine Straight Leg Raises: Left, AAROM, 5 reps, Supine Long Arc Quad: AROM, Left, 5 reps, Seated Knee Flexion: AAROM, Left, 10 reps, Seated Goniometric ROM: 5-65* AAROM L knee  General Comments        Pertinent Vitals/Pain Pain Assessment Pain Score: 2  Pain Location: left knee with walking Pain Descriptors / Indicators: Aching Pain Intervention(s): Limited activity within patient's tolerance, Monitored during session, Premedicated before session, Ice applied    Home Living                          Prior Function             PT Goals (current goals can now be found in the care plan section) Acute Rehab PT Goals 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, do handyman work PT Goal Formulation: With patient Time For Goal Achievement: 07/13/22 Potential to Achieve Goals: Good Progress towards PT goals: Progressing toward goals    Frequency    7X/week      PT Plan Current plan remains appropriate    Co-evaluation              AM-PAC PT "6 Clicks" Mobility   Outcome Measure  Help needed turning from your back to your side while in a flat bed without using bedrails?: None Help needed moving from lying on your back to sitting on the side of a flat bed without using bedrails?: A Little Help needed moving to and from a bed to a chair (including a wheelchair)?: A Little Help needed standing up from a chair using your arms (e.g., wheelchair or bedside chair)?: A Little Help needed to walk in hospital room?: A Little Help needed climbing 3-5 steps with a railing? : A Little 6 Click Score: 19    End of Session Equipment Utilized During Treatment: Gait belt Activity Tolerance: Patient tolerated treatment well Patient left: in chair;with call bell/phone within reach;with chair alarm set Nurse Communication: Mobility status PT Visit Diagnosis: Pain;Difficulty in walking, not elsewhere classified (R26.2) Pain - Right/Left: Left Pain - part of body: Knee     Time: 9242-6834 PT Time Calculation (min) (ACUTE ONLY): 37 min  Charges:  $Gait Training: 8-22 mins $Therapeutic Exercise: 8-22 mins                     Blondell Reveal Kistler PT 07/07/2022  Acute Rehabilitation Services  Office [phone]

## 2022-07-07 NOTE — TOC Transition Note (Signed)
Transition of Care [facility]) - CM/SW Discharge Note  Patient Details  Name: Ronald Bond MRN: [id number] Date of Birth: 1946/07/10  [facility]) CM/SW Contact:  Sherie Don, LCSW Phone Number: 07/07/2022, 9:48 AM  Clinical Narrative: Patient is expected to discharge home after working with PT. CSW met with patient to confirm discharge plan. Patient will go home with HHPT, which was prearranged with [facility]. Patient has a rolling walker at home, so there are no DME needs at this time. TOC signing off.    Final next level of care: [place] Barriers to Discharge: No Barriers Identified  Patient Goals and CMS Choice Patient states their goals for this hospitalization and ongoing recovery are:: Discharge home with [place] CMS Medicare.gov Compare Post Acute Care list provided to:: Patient Choice offered to / list presented to : Patient  Discharge Plan and Services          DME Arranged: N/A DME Agency: NA HH Arranged: PT HH Agency: [facility] Representative spoke with at [place]: Prearranged in orthopedist's office  Readmission Risk Interventions     No data to display

## 2022-07-07 NOTE — Plan of Care (Signed)
  Problem: Pain Management: Goal: Pain level will decrease with appropriate interventions Outcome: Progressing   Problem: Skin Integrity: Goal: Will show signs of wound healing Outcome: Progressing   Problem: Activity: Goal: Ability to avoid complications of mobility impairment will improve Outcome: Progressing

## 2022-07-07 NOTE — Progress Notes (Signed)
     Subjective:  Patient reports pain as well controlled.  No issues overnight.  Denies distal numbness and tingling.  Worked well with physical therapy postoperatively.  Patient was bradycardic postoperatively discussed with nursing.  Held metoprolol.  Discussed also with anesthesia who states he was bradycardic during the surgery and is likely his baseline.  Patient asymptomatic at this point.  Objective:   VITALS:   Vitals:   07/06/22 1834 07/06/22 2146 07/07/22 0143 07/07/22 0609  BP: (!) 126/52 131/62 123/63 123/61  Pulse: (!) 47 (!) 53 (!) 57 (!) 51  Resp: '18 17 17 17  '$ Temp: 97.8 F (36.6 C) 98 F (36.7 C) 98.1 F (36.7 C) 97.7 F (36.5 C)  TempSrc: Oral Oral Oral Oral  SpO2: 100% 99% 98% 98%  Weight:      Height:        Sensation intact distally Intact pulses distally Dorsiflexion/Plantar flexion intact Incision: dressing C/D/I Compartment soft   Lab Results  Component Value Date   WBC 12.0 (H) 07/07/2022   HGB 9.4 (L) 07/07/2022   HCT 29.4 (L) 07/07/2022   MCV 92.2 07/07/2022   PLT 183 07/07/2022   BMET    Component Value Date/Time   NA 135 07/07/2022 0250   NA 139 05/17/2019 0906   K 3.9 07/07/2022 0250   CL 102 07/07/2022 0250   CO2 23 07/07/2022 0250   GLUCOSE 133 (H) 07/07/2022 0250   BUN 34 (H) 07/07/2022 0250   BUN 20 05/17/2019 0906   CREATININE 1.87 (H) 07/07/2022 0250   CALCIUM 8.4 (L) 07/07/2022 0250   GFRNONAA 37 (L) 07/07/2022 0250      Xray: Postop x-rays demonstrate revision knee arthroplasty components in good position no adverse features  Assessment/Plan: 1 Day Post-Op   Principal Problem:   Failed total knee arthroplasty (HCC)  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 Zachery Dakins at [facility].  Address: [address] [place], [place], [place] [address]  Office Phone: [phone]   Charlies Constable, MD Orthopaedic Surgery      Dariella Gillihan A Beila Purdie 07/07/2022, 7:03 AM   Charlies Constable, MD  Contact information:   [phone] 7am-5pm epic message Dr. Zachery Dakins, or call office for patient follow up: [phone] After hours and holidays please check [email or web address] for group call information for [facility]

## 2022-07-08 ENCOUNTER — Telehealth: Payer: Self-pay

## 2022-07-08 ENCOUNTER — Other Ambulatory Visit: Payer: Self-pay | Admitting: Internal Medicine

## 2022-07-08 DIAGNOSIS — R066 Hiccough: Secondary | ICD-10-CM | POA: Insufficient documentation

## 2022-07-08 DIAGNOSIS — M1712 Unilateral primary osteoarthritis, left knee: Secondary | ICD-10-CM | POA: Diagnosis not present

## 2022-07-08 DIAGNOSIS — Z96652 Presence of left artificial knee joint: Secondary | ICD-10-CM | POA: Diagnosis not present

## 2022-07-08 LAB — CBC
HCT: 32.9 % — ABNORMAL LOW (ref 39.0–52.0)
Hemoglobin: 10.3 g/dL — ABNORMAL LOW (ref 13.0–17.0)
MCH: 30.1 pg (ref 26.0–34.0)
MCHC: 31.3 g/dL (ref 30.0–36.0)
MCV: 96.2 fL (ref 80.0–100.0)
Platelets: 175 10*3/uL (ref 150–400)
RBC: 3.42 MIL/uL — ABNORMAL LOW (ref 4.22–5.81)
RDW: 14.6 % (ref 11.5–15.5)
WBC: 11.6 10*3/uL — ABNORMAL HIGH (ref 4.0–10.5)
nRBC: 0 % (ref 0.0–0.2)

## 2022-07-08 LAB — GLUCOSE, CAPILLARY: Glucose-Capillary: 90 mg/dL (ref 70–99)

## 2022-07-08 MED ORDER — METHOCARBAMOL 500 MG PO TABS: 500.0000 mg | ORAL_TABLET | Freq: Three times a day (TID) | ORAL | 0 refills | Status: AC | PRN

## 2022-07-08 MED ORDER — APIXABAN 2.5 MG PO TABS: 2.5000 mg | ORAL_TABLET | Freq: Two times a day (BID) | ORAL | 0 refills | Status: DC

## 2022-07-08 MED ORDER — ONDANSETRON HCL 4 MG PO TABS: 4.0000 mg | ORAL_TABLET | Freq: Three times a day (TID) | ORAL | 0 refills | Status: AC | PRN

## 2022-07-08 MED ORDER — CEFADROXIL 500 MG PO CAPS: 500.0000 mg | ORAL_CAPSULE | Freq: Two times a day (BID) | ORAL | 0 refills | Status: AC

## 2022-07-08 MED ORDER — OXYCODONE HCL 5 MG PO TABS: 5.0000 mg | ORAL_TABLET | ORAL | 0 refills | Status: AC | PRN

## 2022-07-08 MED ORDER — ACETAMINOPHEN 500 MG PO TABS: 1000.0000 mg | ORAL_TABLET | Freq: Three times a day (TID) | ORAL | 0 refills | Status: AC | PRN

## 2022-07-08 MED ORDER — CHLORPROMAZINE HCL 25 MG PO TABS: 25.0000 mg | ORAL_TABLET | Freq: Four times a day (QID) | ORAL | 0 refills | Status: DC | PRN

## 2022-07-08 NOTE — Progress Notes (Signed)
     Subjective:  Patient doing very well. Pain moderately well controlled. Denies distal n/t. Worked very well with PT yesterday ambulating 150 feet. Feels ready to go home today.  Objective:   VITALS:   Vitals:   07/07/22 1006 07/07/22 1330 07/07/22 2105 07/08/22 0532  BP: (!) 145/70 (!) 144/72 124/69 (!) 151/64  Pulse: (!) 43 (!) 43 (!) 52 67  Resp: '16 14 17 17  '$ Temp: 98.1 F (36.7 C) 98 F (36.7 C) 98.4 F (36.9 C) 98.9 F (37.2 C)  TempSrc:  Oral Oral Oral  SpO2: 100% 100% 96% 100%  Weight:      Height:        Sensation intact distally Intact pulses distally Dorsiflexion/Plantar flexion intact Incision: dressing C/D/I Compartment soft   Lab Results  Component Value Date   WBC 11.6 (H) 07/08/2022   HGB 10.3 (L) 07/08/2022   HCT 32.9 (L) 07/08/2022   MCV 96.2 07/08/2022   PLT 175 07/08/2022   BMET    Component Value Date/Time   NA 135 07/07/2022 0250   NA 139 05/17/2019 0906   K 3.9 07/07/2022 0250   CL 102 07/07/2022 0250   CO2 23 07/07/2022 0250   GLUCOSE 133 (H) 07/07/2022 0250   BUN 34 (H) 07/07/2022 0250   BUN 20 05/17/2019 0906   CREATININE 1.87 (H) 07/07/2022 0250   CALCIUM 8.4 (L) 07/07/2022 0250   GFRNONAA 37 (L) 07/07/2022 0250      Xray: Postop x-rays demonstrate revision knee arthroplasty components in good position no adverse features  Assessment/Plan: 2 Days Post-Op   Principal Problem:   Failed total knee arthroplasty (HCC)  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 Zachery Bond at [facility].  Address: [address] [place], [place], [place] [address]  Office Phone: [phone]   Ronald Constable, MD Orthopaedic Surgery      Ronald Bond 07/08/2022, 7:37 AM   Ronald Constable, MD  Contact information:   [phone] 7am-5pm epic message Dr. Zachery Bond, or call office for patient follow up: [phone] After hours and holidays please check [email or web address] for group call information for [facility]

## 2022-07-08 NOTE — Progress Notes (Signed)
Physical Therapy Treatment Patient Details Name: Ronald Bond MRN: [id number] DOB: [birth year] Today's Date: 07/08/2022   History of Present Illness Pt is a 76yo male presenting s/p L total knee revision on 07/06/22. PMH: GERD, HTN, HLD, hypothyroidism, pre-diabetes, L-TKA 2009.    PT Comments    Pt reports increased pain overnight and this morning, he also reports frequent hiccoughing overnight. He ambulated 150' with RW, no loss of balance, 3 standing rest breaks 2* pain/fatigue. Stair training completed. Pt quite fatigued at end of session. Will return later this morning to review HEP, then pt will be ready to DC home from a PT standpoint.    Recommendations for follow up therapy are one component of a multi-disciplinary discharge planning process, led by the attending physician.  Recommendations may be updated based on patient status, additional functional criteria and insurance authorization.  Follow Up Recommendations  Follow physician's recommendations for discharge plan and follow up therapies     Assistance Recommended at Discharge Intermittent Supervision/Assistance  Patient can return home with the following A little help with walking and/or transfers;A little help with bathing/dressing/bathroom;Assistance with cooking/housework;Assist for transportation;Help with stairs or ramp for entrance   Equipment Recommendations  None recommended by PT    Recommendations for Other Services       Precautions / Restrictions Precautions Precautions: Fall;Knee Precaution Booklet Issued: Yes (comment) Precaution Comments: Bradycardia (watch HR); reviewed no pillow under knee Restrictions Weight Bearing Restrictions: Yes LLE Weight Bearing: Partial weight bearing LLE Partial Weight Bearing Percentage or Pounds: 50     Mobility  Bed Mobility Overal bed mobility: Modified Independent Bed Mobility: Supine to Sit     Supine to sit: HOB elevated, Min assist     General bed  mobility comments: assist to raise trunk    Transfers Overall transfer level: Needs assistance Equipment used: Rolling walker (2 wheels) Transfers: Sit to/from Stand Sit to Stand: From elevated surface, Supervision           General transfer comment: VCs for hand placement. Educated pt on 50%PWB on LLE and pt verbalized understanding.    Ambulation/Gait Ambulation/Gait assistance: Modified independent (Device/Increase time) Gait Distance (Feet): 150 Feet Assistive device: Rolling walker (2 wheels) Gait Pattern/deviations: Step-to pattern, Decreased weight shift to left Gait velocity: decreased     General Gait Details: steady, no loss of balance   Stairs Stairs: Yes Stairs assistance: Min guard Stair Management: One rail Right, With cane, Step to pattern, Forwards Number of Stairs: 3 General stair comments: VCs sequencing   Wheelchair Mobility    Modified Rankin (Stroke Patients Only)       Balance Overall balance assessment: Needs assistance Sitting-balance support: Feet supported, No upper extremity supported Sitting balance-Leahy Scale: Good     Standing balance support: During functional activity, Reliant on assistive device for balance, Bilateral upper extremity supported Standing balance-Leahy Scale: Fair                              Cognition Arousal/Alertness: Awake/alert Behavior During Therapy: WFL for tasks assessed/performed Overall Cognitive Status: Within Functional Limits for tasks assessed                                          Exercises Total Joint Exercises Knee Flexion: AAROM, Left, 10 reps, Seated    General Comments  Pertinent Vitals/Pain Pain Assessment Pain Score: 10-Worst pain ever Pain Location: left knee with walking Pain Descriptors / Indicators: Aching, Operative site guarding, Grimacing Pain Intervention(s): Limited activity within patient's tolerance, Monitored during session,  Premedicated before session, Ice applied, Repositioned    Home Living                          Prior Function            PT Goals (current goals can now be found in the care plan section) Acute Rehab PT Goals 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, do handyman work PT Goal Formulation: With patient Time For Goal Achievement: 07/13/22 Potential to Achieve Goals: Good Progress towards PT goals: Progressing toward goals    Frequency    7X/week      PT Plan Current plan remains appropriate    Co-evaluation              AM-PAC PT "6 Clicks" Mobility   Outcome Measure  Help needed turning from your back to your side while in a flat bed without using bedrails?: None Help needed moving from lying on your back to sitting on the side of a flat bed without using bedrails?: A Little Help needed moving to and from a bed to a chair (including a wheelchair)?: A Little Help needed standing up from a chair using your arms (e.g., wheelchair or bedside chair)?: A Little Help needed to walk in hospital room?: None Help needed climbing 3-5 steps with a railing? : A Little 6 Click Score: 20    End of Session Equipment Utilized During Treatment: Gait belt Activity Tolerance: Patient limited by pain;Patient limited by fatigue Patient left: with call bell/phone within reach;in bed;with bed alarm set Nurse Communication: Mobility status PT Visit Diagnosis: Pain;Difficulty in walking, not elsewhere classified (R26.2) Pain - Right/Left: Left Pain - part of body: Knee     Time: 0831-0909 PT Time Calculation (min) (ACUTE ONLY): 38 min  Charges:  $Gait Training: 8-22 mins $Therapeutic Exercise: 8-22 mins $Therapeutic Activity: 8-22 mins                    Blondell Reveal Kistler PT 07/08/2022  [place]  Office [phone]

## 2022-07-08 NOTE — Telephone Encounter (Signed)
Pt is calling requesting a Rx for a severe case of on going hiccups. Pt was just discharged from the [facility] post Total knee revision.   Please advise

## 2022-07-08 NOTE — Discharge Summary (Signed)
Physician Discharge Summary  Patient ID: Ronald Bond MRN: [id number] DOB/AGE: 03-24-1946 76 y.o.  Admit date: 07/06/2022 Discharge date: 07/08/2022  Admission Diagnoses:  Failed total knee arthroplasty [facility])  Discharge Diagnoses:  Principal Problem:   Failed total knee arthroplasty [facility])   Past Medical History:  Diagnosis Date   GERD (gastroesophageal reflux disease)    HTN (hypertension)    Hyperlipidemia    Hypothyroidism    Osteoarthritis    Pre-diabetes    Wears glasses     Surgeries: Procedure(s): TOTAL KNEE REVISION on 07/06/2022   Consultants (if any):   Discharged Condition: Improved  Hospital Course: Ronald Bond is an 76 y.o. male who was admitted 07/06/2022 with a diagnosis of Failed total knee arthroplasty ([place]) and went to the operating room on 07/06/2022 and underwent the above named procedures.    He was given perioperative antibiotics:  Anti-infectives (From admission, onward)    Start     Dose/Rate Route Frequency Ordered Stop   07/08/22 0000  cefadroxil (DURICEF) 500 MG capsule        500 mg Oral 2 times daily 07/08/22 0736 07/15/22 2359   07/06/22 1600  ceFAZolin (ANCEF) IVPB 2g/100 mL premix        2 g 200 mL/hr over 30 Minutes Intravenous Every 8 hours 07/06/22 1338 07/09/22 1359   07/06/22 0615  ceFAZolin (ANCEF) IVPB 2g/100 mL premix        2 g 200 mL/hr over 30 Minutes Intravenous On call to O.R. 07/06/22 7106 07/06/22 2694     .  He was given sequential compression devices, early ambulation, and eliquis for DVT prophylaxis.  He benefited maximally from the hospital stay and there were no complications.    Recent vital signs:  Vitals:   07/07/22 2105 07/08/22 0532  BP: 124/69 (!) 151/64  Pulse: (!) 52 67  Resp: 17 17  Temp: 98.4 F (36.9 C) 98.9 F (37.2 C)  SpO2: 96% 100%    Recent laboratory studies:  Lab Results  Component Value Date   HGB 10.3 (L) 07/08/2022   HGB 9.4 (L) 07/07/2022   HGB 13.5 06/06/2022   Lab  Results  Component Value Date   WBC 11.6 (H) 07/08/2022   PLT 175 07/08/2022   Lab Results  Component Value Date   INR 0.9 10/17/2007   Lab Results  Component Value Date   NA 135 07/07/2022   K 3.9 07/07/2022   CL 102 07/07/2022   CO2 23 07/07/2022   BUN 34 (H) 07/07/2022   CREATININE 1.87 (H) 07/07/2022   GLUCOSE 133 (H) 07/07/2022    Discharge Medications:   Allergies as of 07/08/2022       Reactions   Lisinopril Other (See Comments)   Edema in face        Medication List     STOP taking these medications    aspirin EC 81 MG tablet       TAKE these medications    acetaminophen 500 MG tablet Commonly known as: TYLENOL Take 2 tablets (1,000 mg total) by mouth every 8 (eight) hours as needed.   albuterol 108 (90 Base) MCG/ACT inhaler Commonly known as: VENTOLIN HFA Inhale 2 puffs into the lungs every 6 (six) hours as needed for wheezing or shortness of breath.   amLODipine 10 MG tablet Commonly known as: NORVASC Take 1 tablet (10 mg total) by mouth daily.   apixaban 2.5 MG Tabs tablet Commonly known as: Eliquis Take 1 tablet (2.5 mg  total) by mouth 2 (two) times daily.   ascorbic acid 500 MG tablet Commonly known as: VITAMIN C Take 500 mg by mouth daily.   atorvastatin 40 MG tablet Commonly known as: LIPITOR TAKE 1 TABLET BY MOUTH EVERY DAY   Blood Pressure Monitor/Arm Devi 1 Device by Does not apply route daily. USE DAILY TO  TAKE BLOOD PRESSURE READINGS   cefadroxil 500 MG capsule Commonly known as: DURICEF Take 1 capsule (500 mg total) by mouth 2 (two) times daily for 7 days.   cetirizine 10 MG tablet Commonly known as: ZYRTEC Take 10 mg by mouth daily.   cholecalciferol 25 MCG (1000 UNIT) tablet Commonly known as: VITAMIN D3 Take 1,000 Units by mouth daily.   docusate sodium 100 MG capsule Commonly known as: COLACE Take 100 mg by mouth 2 (two) times daily.   Farxiga 10 MG Tabs tablet Generic drug: dapagliflozin propanediol TAKE  1 TABLET BY MOUTH EVERY DAY BEFORE BREAKFAST   fluticasone 50 MCG/ACT nasal spray Commonly known as: FLONASE Place 2 sprays into both nostrils daily. What changed:  when to take this reasons to take this   hydrALAZINE 25 MG tablet Commonly known as: APRESOLINE Take 1 tablet (25 mg total) by mouth 3 (three) times daily.   indapamide 1.25 MG tablet Commonly known as: LOZOL TAKE 1 TABLET BY MOUTH EVERY DAY   Klor-Con M20 20 MEQ tablet Generic drug: potassium chloride SA TAKE 1 TABLET BY MOUTH TWICE A DAY   levothyroxine 112 MCG tablet Commonly known as: SYNTHROID TAKE 1 TABLET BY MOUTH EVERY DAY   meloxicam 7.5 MG tablet Commonly known as: MOBIC TAKE 1 TABLET BY MOUTH EVERY DAY   methocarbamol 500 MG tablet Commonly known as: ROBAXIN Take 1 tablet (500 mg total) by mouth every 8 (eight) hours as needed for up to 10 days for muscle spasms.   metoprolol tartrate 25 MG tablet Commonly known as: LOPRESSOR Take 0.5 tablets (12.5 mg total) by mouth 2 (two) times daily.   multivitamin tablet Take 1 tablet by mouth daily.   omeprazole 40 MG capsule Commonly known as: PRILOSEC TAKE 1 CAPSULE (40 MG TOTAL) BY MOUTH DAILY.   ondansetron 4 MG tablet Commonly known as: Zofran Take 1 tablet (4 mg total) by mouth every 8 (eight) hours as needed for up to 14 days for nausea or vomiting.   oxyCODONE 5 MG immediate release tablet Commonly known as: Roxicodone Take 1-2 tablets (5-10 mg total) by mouth every 4 (four) hours as needed for up to 7 days for severe pain or moderate pain.   Polyethyl Glycol-Propyl Glycol 0.4-0.3 % Soln Place 1 drop into both eyes as needed (dry eyes).   tamsulosin 0.4 MG Caps capsule Commonly known as: FLOMAX TAKE 1 CAPSULE BY MOUTH EVERY DAY AFTER SUPPER   TURMERIC CURCUMIN PO Take 1 tablet by mouth daily.   valsartan 320 MG tablet Commonly known as: DIOVAN Take 1 tablet (320 mg total) by mouth daily.   ZINC PO Take 1 tablet by mouth daily.         Diagnostic Studies: DG Knee Left Port  Result Date: 07/06/2022 CLINICAL DATA:  Status post total left knee arthroplasty. EXAM: PORTABLE LEFT KNEE - 1-2 VIEW COMPARISON:  Left knee radiographs 12/15/2004, 04/04/2022 FINDINGS: Status post revision of total left knee arthroplasty now with longer femoral and tibial stems. No perihardware lucency is seen to indicate hardware failure or loosening. Expected postoperative changes including intra-articular air and subcutaneous air. Anterior distal thigh vacuum  assisted drain. No acute fracture or dislocation. IMPRESSION: Status post revision of total left knee arthroplasty without evidence of hardware failure. Electronically Signed   By: Yvonne Kendall M.D.   On: 07/06/2022 13:59    Disposition: Discharge disposition: 01-Home or Self Care       Discharge Instructions     Call MD / Call 911   Complete by: As directed    If you experience chest pain or shortness of breath, CALL 911 and be transported to the hospital emergency room.  If you develope a fever above 101 F, pus (white drainage) or increased drainage or redness at the wound, or calf pain, call your surgeon's office.   Constipation Prevention   Complete by: As directed    Drink plenty of fluids.  Prune juice may be helpful.  You may use a stool softener, such as Colace (over the counter) 100 mg twice a day.  Use MiraLax (over the counter) for constipation as needed.   Diet - low sodium heart healthy   Complete by: As directed    Do not put a pillow under the knee. Place it under the heel.   Complete by: As directed    Increase activity slowly as tolerated   Complete by: As directed    Post-operative opioid taper instructions:   Complete by: As directed    POST-OPERATIVE OPIOID TAPER INSTRUCTIONS: It is important to wean off of your opioid medication as soon as possible. If you do not need pain medication after your surgery it is ok to stop day one. Opioids include: Codeine,  Hydrocodone(Norco, Vicodin), Oxycodone(Percocet, oxycontin) and hydromorphone amongst others.  Long term and even short term use of opiods can cause: Increased pain response Dependence Constipation Depression Respiratory depression And more.  Withdrawal symptoms can include Flu like symptoms Nausea, vomiting And more Techniques to manage these symptoms Hydrate well Eat regular healthy meals Stay active Use relaxation techniques(deep breathing, meditating, yoga) Do Not substitute Alcohol to help with tapering If you have been on opioids for less than two weeks and do not have pain than it is ok to stop all together.  Plan to wean off of opioids This plan should start within one week post op of your joint replacement. Maintain the same interval or time between taking each dose and first decrease the dose.  Cut the total daily intake of opioids by one tablet each day Next start to increase the time between doses. The last dose that should be eliminated is the evening dose.           Follow-up Information     [facility].   Specialty: Home Health Services Why: [facility] will provide PT in the home after discharge. Contact information: [address] [place] [address] [phone]         Willaim Sheng, MD Follow up in 1 week(s).   Specialty: Orthopedic Surgery Contact information: [place] [address] [phone]                    Discharge Instructions      Information on my medicine - ELIQUIS (apixaban)  Why was Eliquis prescribed for you? Eliquis was prescribed for you to reduce the risk of blood clots forming after orthopedic surgery.    What do You need to know about Eliquis? Take your Eliquis TWICE DAILY - one tablet in the morning and one tablet in the evening  with or without food.  It would be best to take the dose about the same time each day.  If you have  difficulty swallowing the tablet whole please discuss with your pharmacist how to take the medication safely.  Take Eliquis exactly as prescribed by your doctor and DO NOT stop taking Eliquis without talking to the doctor who prescribed the medication.  Stopping without other medication to take the place of Eliquis may increase your risk of developing a clot.  After discharge, you should have regular check-up appointments with your healthcare provider that is prescribing your Eliquis.  What do you do if you miss a dose? If a dose of ELIQUIS is not taken at the scheduled time, take it as soon as possible on the same day and twice-daily administration should be resumed.  The dose should not be doubled to make up for a missed dose.  Do not take more than one tablet of ELIQUIS at the same time.  Important Safety Information A possible side effect of Eliquis is bleeding. You should call your healthcare provider right away if you experience any of the following: Bleeding from an injury or your nose that does not stop. Unusual colored urine (red or dark brown) or unusual colored stools (red or black). Unusual bruising for unknown reasons. A serious fall or if you hit your head (even if there is no bleeding).  Some medicines may interact with Eliquis and might increase your risk of bleeding or clotting while on Eliquis. To help avoid this, consult your healthcare provider or pharmacist prior to using any new prescription or non-prescription medications, including herbals, vitamins, non-steroidal anti-inflammatory drugs (NSAIDs) and supplements.  This website has more information on Eliquis (apixaban): http://www.eliquis.com/eliquis/home    INSTRUCTIONS AFTER JOINT REPLACEMENT   Remove items at home which could result in a fall. This includes throw rugs or furniture in walking pathways ICE to the affected joint every three hours while awake for 30 minutes at a time, for at least the first 3-5  days, and then as needed for pain and swelling.  Continue to use ice for pain and swelling. You may notice swelling that will progress down to the foot and ankle.  This is normal after surgery.  Elevate your leg when you are not up walking on it.   Continue to use the breathing machine you got in the hospital (incentive spirometer) which will help keep your temperature down.  It is common for your temperature to cycle up and down following surgery, especially at night when you are not up moving around and exerting yourself.  The breathing machine keeps your lungs expanded and your temperature down.   DIET:  As you were doing prior to hospitalization, we recommend a well-balanced diet.  DRESSING / WOUND CARE / SHOWERING  Keep the surgical dressing until follow up.  The dressing is water proof, so you can shower without any extra covering.  IF THE DRESSING FALLS OFF or the wound gets wet inside, change the dressing with sterile gauze.  Please use good hand washing techniques before changing the dressing.  Do not use any lotions or creams on the incision until instructed by your surgeon.    ACTIVITY  Increase activity slowly as tolerated, but follow the weight bearing instructions below.   No driving for 6 weeks or until further direction given by your physician.  You cannot drive while taking narcotics.  No lifting or carrying greater than 10 lbs. until further directed by your  Psychologist, sport and exercise. Avoid periods of inactivity such as sitting longer than an hour when not asleep. This helps prevent blood clots.  You may return to work once you are authorized by your doctor.     WEIGHT BEARING   Weight bearing as tolerated with assist device (walker, cane, etc) as directed, use it as long as suggested by your surgeon or therapist, typically at least 4-6 weeks.   EXERCISES  Results after joint replacement surgery are often greatly improved when you follow the exercise, range of motion and muscle  strengthening exercises prescribed by your doctor. Safety measures are also important to protect the joint from further injury. Any time any of these exercises cause you to have increased pain or swelling, decrease what you are doing until you are comfortable again and then slowly increase them. If you have problems or questions, call your caregiver or physical therapist for advice.   Rehabilitation is important following a joint replacement. After just a few days of immobilization, the muscles of the leg can become weakened and shrink (atrophy).  These exercises are designed to build up the tone and strength of the thigh and leg muscles and to improve motion. Often times heat used for twenty to thirty minutes before working out will loosen up your tissues and help with improving the range of motion but do not use heat for the first two weeks following surgery (sometimes heat can increase post-operative swelling).   These exercises can be done on a training (exercise) mat, on the floor, on a table or on a bed. Use whatever works the best and is most comfortable for you.    Use music or television while you are exercising so that the exercises are a pleasant break in your day. This will make your life better with the exercises acting as a break in your routine that you can look forward to.   Perform all exercises about fifteen times, three times per day or as directed.  You should exercise both the operative leg and the other leg as well.  Exercises include:   Quad Sets - Tighten up the muscle on the front of the thigh (Quad) and hold for 5-10 seconds.   Straight Leg Raises - With your knee straight (if you were given a brace, keep it on), lift the leg to 60 degrees, hold for 3 seconds, and slowly lower the leg.  Perform this exercise against resistance later as your leg gets stronger.  Leg Slides: Lying on your back, slowly slide your foot toward your buttocks, bending your knee up off the floor (only go  as far as is comfortable). Then slowly slide your foot back down until your leg is flat on the floor again.  Angel Wings: Lying on your back spread your legs to the side as far apart as you can without causing discomfort.  Hamstring Strength:  Lying on your back, push your heel against the floor with your leg straight by tightening up the muscles of your buttocks.  Repeat, but this time bend your knee to a comfortable angle, and push your heel against the floor.  You may put a pillow under the heel to make it more comfortable if necessary.   A rehabilitation program following joint replacement surgery can speed recovery and prevent re-injury in the future due to weakened muscles. Contact your doctor or a physical therapist for more information on knee rehabilitation.    CONSTIPATION  Constipation is defined medically as fewer than three stools per  week and severe constipation as less than one stool per week.  Even if you have a regular bowel pattern at home, your normal regimen is likely to be disrupted due to multiple reasons following surgery.  Combination of anesthesia, postoperative narcotics, change in appetite and fluid intake all can affect your bowels.   YOU MUST use at least one of the following options; they are listed in order of increasing strength to get the job done.  They are all available over the counter, and you may need to use some, POSSIBLY even all of these options:    Drink plenty of fluids (prune juice may be helpful) and high fiber foods Colace 100 mg by mouth twice a day  Senokot for constipation as directed and as needed Dulcolax (bisacodyl), take with full glass of water  Miralax (polyethylene glycol) once or twice a day as needed.  If you have tried all these things and are unable to have a bowel movement in the first 3-4 days after surgery call either your surgeon or your primary doctor.    If you experience loose stools or diarrhea, hold the medications until you  stool forms back up.  If your symptoms do not get better within 1 week or if they get worse, check with your doctor.  If you experience "the worst abdominal pain ever" or develop nausea or vomiting, please contact the office immediately for further recommendations for treatment.   ITCHING:  If you experience itching with your medications, try taking only a single pain pill, or even half a pain pill at a time.  You can also use Benadryl over the counter for itching or also to help with sleep.   TED HOSE STOCKINGS:  Use stockings on both legs until for at least 2 weeks or as directed by physician office. They may be removed at night for sleeping.  MEDICATIONS:  See your medication summary on the "After Visit Summary" that nursing will review with you.  You may have some home medications which will be placed on hold until you complete the course of blood thinner medication.  It is important for you to complete the blood thinner medication as prescribed.   Blood clot prevention (DVT Prophylaxis): After surgery you are at an increased risk for a blood clot. you were prescribed a blood thinner, eliquis, to be taken twice daily for a total of 4 weeks from surgery to help reduce your risk of getting a blood clot. This will help prevent a blood clot. Signs of a pulmonary embolus (blood clot in the lungs) include sudden short of breath, feeling lightheaded or dizzy, chest pain with a deep breath, rapid pulse rapid breathing. Signs of a blood clot in your arms or legs include new unexplained swelling and cramping, warm, red or darkened skin around the painful area. Please call the office or 911 right away if these signs or symptoms develop.  PRECAUTIONS:  If you experience chest pain or shortness of breath - call 911 immediately for transfer to the hospital emergency department.   If you develop a fever greater that 101 F, purulent drainage from wound, increased redness or drainage from wound, foul odor from the  wound/dressing, or calf pain - CONTACT YOUR SURGEON.  FOLLOW-UP APPOINTMENTS:  If you do not already have a post-op appointment, please call the office for an appointment to be seen by your surgeon.  Will see you back in 1 week after surgery for follow up.  OTHER INSTRUCTIONS:   Knee Replacement:  Do not place pillow under knee, focus on keeping the knee straight while resting. CPM instructions: 0-90 degrees, 2 hours in the morning, 2 hours in the afternoon, and 2 hours in the evening. Place foam block, curve side up under heel at all times except when in CPM or when walking.  DO NOT modify, tear, cut, or change the foam block in any way.  POST-OPERATIVE OPIOID TAPER INSTRUCTIONS: It is important to wean off of your opioid medication as soon as possible. If you do not need pain medication after your surgery it is ok to stop day one. Opioids include: Codeine, Hydrocodone(Norco, Vicodin), Oxycodone(Percocet, oxycontin) and hydromorphone amongst others.  Long term and even short term use of opiods can cause: Increased pain response Dependence Constipation Depression Respiratory depression And more.  Withdrawal symptoms can include Flu like symptoms Nausea, vomiting And more Techniques to manage these symptoms Hydrate well Eat regular healthy meals Stay active Use relaxation techniques(deep breathing, meditating, yoga) Do Not substitute Alcohol to help with tapering If you have been on opioids for less than two weeks and do not have pain than it is ok to stop all together.  Plan to wean off of opioids This plan should start within one week post op of your joint replacement. Maintain the same interval or time between taking each dose and first decrease the dose.  Cut the total daily intake of opioids by one tablet each day Next start to increase the time between doses. The last dose that should be eliminated is the evening dose.   MAKE  SURE YOU:  Understand these instructions.  Get help right away if you are not doing well or get worse.    Thank you for letting us be a part of your medical care team.  It is a privilege we respect greatly.  We hope these instructions will help you stay on track for a fast and full recovery!            Signed: Makinzie Considine A Jaquita Bessire 07/08/2022, 7:38 AM

## 2022-07-08 NOTE — Progress Notes (Signed)
Physical Therapy Treatment Patient Details Name: Ronald Bond MRN: [id number] DOB: March 18, 1946 Today's Date: 07/08/2022   History of Present Illness Pt is a 76yo male presenting s/p L total knee revision on 07/06/22. PMH: GERD, HTN, HLD, hypothyroidism, pre-diabetes, L-TKA 2009.    PT Comments    Reviewed TKA HEP, pt demonstrates good understanding. He is ready to DC home from a PT standpoint.    Recommendations for follow up therapy are one component of a multi-disciplinary discharge planning process, led by the attending physician.  Recommendations may be updated based on patient status, additional functional criteria and insurance authorization.  Follow Up Recommendations  Follow physician's recommendations for discharge plan and follow up therapies     Assistance Recommended at Discharge Intermittent Supervision/Assistance  Patient can return home with the following A little help with walking and/or transfers;A little help with bathing/dressing/bathroom;Assistance with cooking/housework;Assist for transportation;Help with stairs or ramp for entrance   Equipment Recommendations  None recommended by PT    Recommendations for Other Services       Precautions / Restrictions Precautions Precautions: Fall;Knee Precaution Booklet Issued: Yes (comment) Precaution Comments: Bradycardia (watch HR); reviewed no pillow under knee Restrictions Weight Bearing Restrictions: Yes LLE Weight Bearing: Partial weight bearing LLE Partial Weight Bearing Percentage or Pounds: 50     Mobility  Bed Mobility Overal bed mobility: Modified Independent Bed Mobility: Supine to Sit     Supine to sit: Modified independent (Device/Increase time), HOB elevated     General bed mobility comments: used rail    Transfers Overall transfer level: Needs assistance Equipment used: Rolling walker (2 wheels) Transfers: Sit to/from Stand Sit to Stand: From elevated surface, Supervision            General transfer comment: VCs for hand placement. Educated pt on 50%PWB on LLE and pt verbalized understanding.    Ambulation/Gait Ambulation/Gait assistance: Modified independent (Device/Increase time) Gait Distance (Feet): 150 Feet Assistive device: Rolling walker (2 wheels) Gait Pattern/deviations: Step-to pattern, Decreased weight shift to left Gait velocity: decreased     General Gait Details: steady, no loss of balance   Stairs Stairs: Yes Stairs assistance: Min guard Stair Management: One rail Right, With cane, Step to pattern, Forwards Number of Stairs: 3 General stair comments: VCs sequencing   Wheelchair Mobility    Modified Rankin (Stroke Patients Only)       Balance Overall balance assessment: Needs assistance Sitting-balance support: Feet supported, No upper extremity supported Sitting balance-Leahy Scale: Good     Standing balance support: During functional activity, Reliant on assistive device for balance, Bilateral upper extremity supported Standing balance-Leahy Scale: Fair                              Cognition Arousal/Alertness: Awake/alert Behavior During Therapy: WFL for tasks assessed/performed Overall Cognitive Status: Within Functional Limits for tasks assessed                                          Exercises Total Joint Exercises Ankle Circles/Pumps: AROM, Both, 10 reps, Supine Quad Sets: AROM, Left, 5 reps, Supine Short Arc Quad: Left, Supine, AAROM, 10 reps Heel Slides: AAROM, Left, Supine, 10 reps Hip ABduction/ADduction: AAROM, Left, Supine, 10 reps Straight Leg Raises: Left, AAROM, Supine, 10 reps Long Arc Quad: AROM, Left, Seated, 10 reps, AAROM Knee Flexion: AAROM, Left,  10 reps, Seated Goniometric ROM: 5-70* AAROM L knee    General Comments        Pertinent Vitals/Pain Pain Assessment Pain Score: 8  Pain Location: left knee with activity Pain Descriptors / Indicators: Aching, Operative  site guarding, Grimacing Pain Intervention(s): Limited activity within patient's tolerance, Monitored during session, Premedicated before session, Ice applied    Home Living                          Prior Function            PT Goals (current goals can now be found in the care plan section) Acute Rehab PT Goals 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, do handyman work PT Goal Formulation: With patient Time For Goal Achievement: 07/13/22 Potential to Achieve Goals: Good Progress towards PT goals: Progressing toward goals    Frequency    7X/week      PT Plan Current plan remains appropriate    Co-evaluation              AM-PAC PT "6 Clicks" Mobility   Outcome Measure  Help needed turning from your back to your side while in a flat bed without using bedrails?: None Help needed moving from lying on your back to sitting on the side of a flat bed without using bedrails?: A Little Help needed moving to and from a bed to a chair (including a wheelchair)?: None Help needed standing up from a chair using your arms (e.g., wheelchair or bedside chair)?: None Help needed to walk in hospital room?: None Help needed climbing 3-5 steps with a railing? : A Little 6 Click Score: 22    End of Session Equipment Utilized During Treatment: Gait belt Activity Tolerance: Patient tolerated treatment well Patient left: with call bell/phone within reach;in bed;with bed alarm set Nurse Communication: Mobility status PT Visit Diagnosis: Pain;Difficulty in walking, not elsewhere classified (R26.2) Pain - Right/Left: Left Pain - part of body: Knee     Time: 7939-0300 PT Time Calculation (min) (ACUTE ONLY): 17 min  Charges:   $Therapeutic Exercise: 8-22 mins       Blondell Reveal Kistler PT 07/08/2022  Acute Rehabilitation Services  Office [phone]

## 2022-07-08 NOTE — Care Management Important Message (Signed)
Important Message  Patient Details IM Letter given to the Patient. Name: Ronald Bond MRN: [id number] Date of Birth: 07-May-1946   Medicare Important Message Given:  Yes     Kerin Salen 07/08/2022, 1:55 PM

## 2022-07-08 NOTE — Plan of Care (Signed)
Pt ready to DC home with Prevena wound vac.

## 2022-07-09 DIAGNOSIS — Z7984 Long term (current) use of oral hypoglycemic drugs: Secondary | ICD-10-CM | POA: Diagnosis not present

## 2022-07-09 DIAGNOSIS — N400 Enlarged prostate without lower urinary tract symptoms: Secondary | ICD-10-CM | POA: Diagnosis not present

## 2022-07-09 DIAGNOSIS — J4520 Mild intermittent asthma, uncomplicated: Secondary | ICD-10-CM | POA: Diagnosis not present

## 2022-07-09 DIAGNOSIS — M1711 Unilateral primary osteoarthritis, right knee: Secondary | ICD-10-CM | POA: Diagnosis not present

## 2022-07-09 DIAGNOSIS — M5412 Radiculopathy, cervical region: Secondary | ICD-10-CM | POA: Diagnosis not present

## 2022-07-09 DIAGNOSIS — N183 Chronic kidney disease, stage 3 unspecified: Secondary | ICD-10-CM | POA: Diagnosis not present

## 2022-07-09 DIAGNOSIS — E0500 Thyrotoxicosis with diffuse goiter without thyrotoxic crisis or storm: Secondary | ICD-10-CM | POA: Diagnosis not present

## 2022-07-09 DIAGNOSIS — E1122 Type 2 diabetes mellitus with diabetic chronic kidney disease: Secondary | ICD-10-CM | POA: Diagnosis not present

## 2022-07-09 DIAGNOSIS — E039 Hypothyroidism, unspecified: Secondary | ICD-10-CM | POA: Diagnosis not present

## 2022-07-09 DIAGNOSIS — G4733 Obstructive sleep apnea (adult) (pediatric): Secondary | ICD-10-CM | POA: Diagnosis not present

## 2022-07-09 DIAGNOSIS — J309 Allergic rhinitis, unspecified: Secondary | ICD-10-CM | POA: Diagnosis not present

## 2022-07-09 DIAGNOSIS — T84013D Broken internal left knee prosthesis, subsequent encounter: Secondary | ICD-10-CM | POA: Diagnosis not present

## 2022-07-09 DIAGNOSIS — I1310 Hypertensive heart and chronic kidney disease without heart failure, with stage 1 through stage 4 chronic kidney disease, or unspecified chronic kidney disease: Secondary | ICD-10-CM | POA: Diagnosis not present

## 2022-07-09 DIAGNOSIS — N529 Male erectile dysfunction, unspecified: Secondary | ICD-10-CM | POA: Diagnosis not present

## 2022-07-09 DIAGNOSIS — K219 Gastro-esophageal reflux disease without esophagitis: Secondary | ICD-10-CM | POA: Diagnosis not present

## 2022-07-09 DIAGNOSIS — Z792 Long term (current) use of antibiotics: Secondary | ICD-10-CM | POA: Diagnosis not present

## 2022-07-09 DIAGNOSIS — M4802 Spinal stenosis, cervical region: Secondary | ICD-10-CM | POA: Diagnosis not present

## 2022-07-09 DIAGNOSIS — M5126 Other intervertebral disc displacement, lumbar region: Secondary | ICD-10-CM | POA: Diagnosis not present

## 2022-07-09 DIAGNOSIS — Z7901 Long term (current) use of anticoagulants: Secondary | ICD-10-CM | POA: Diagnosis not present

## 2022-07-09 DIAGNOSIS — E785 Hyperlipidemia, unspecified: Secondary | ICD-10-CM | POA: Diagnosis not present

## 2022-07-09 DIAGNOSIS — E1169 Type 2 diabetes mellitus with other specified complication: Secondary | ICD-10-CM | POA: Diagnosis not present

## 2022-07-09 DIAGNOSIS — D509 Iron deficiency anemia, unspecified: Secondary | ICD-10-CM | POA: Diagnosis not present

## 2022-07-09 DIAGNOSIS — Z6830 Body mass index (BMI) 30.0-30.9, adult: Secondary | ICD-10-CM | POA: Diagnosis not present

## 2022-07-09 DIAGNOSIS — E669 Obesity, unspecified: Secondary | ICD-10-CM | POA: Diagnosis not present

## 2022-07-09 DIAGNOSIS — Z471 Aftercare following joint replacement surgery: Secondary | ICD-10-CM | POA: Diagnosis not present

## 2022-07-11 LAB — AEROBIC/ANAEROBIC CULTURE W GRAM STAIN (SURGICAL/DEEP WOUND)
Culture: NO GROWTH
Culture: NO GROWTH
Culture: NO GROWTH
Culture: NO GROWTH
Gram Stain: NONE SEEN
Gram Stain: NONE SEEN
Gram Stain: NONE SEEN
Gram Stain: NONE SEEN

## 2022-07-13 DIAGNOSIS — D509 Iron deficiency anemia, unspecified: Secondary | ICD-10-CM | POA: Diagnosis not present

## 2022-07-13 DIAGNOSIS — M4802 Spinal stenosis, cervical region: Secondary | ICD-10-CM | POA: Diagnosis not present

## 2022-07-13 DIAGNOSIS — Z7901 Long term (current) use of anticoagulants: Secondary | ICD-10-CM | POA: Diagnosis not present

## 2022-07-13 DIAGNOSIS — J309 Allergic rhinitis, unspecified: Secondary | ICD-10-CM | POA: Diagnosis not present

## 2022-07-13 DIAGNOSIS — N183 Chronic kidney disease, stage 3 unspecified: Secondary | ICD-10-CM | POA: Diagnosis not present

## 2022-07-13 DIAGNOSIS — N400 Enlarged prostate without lower urinary tract symptoms: Secondary | ICD-10-CM | POA: Diagnosis not present

## 2022-07-13 DIAGNOSIS — Z6830 Body mass index (BMI) 30.0-30.9, adult: Secondary | ICD-10-CM | POA: Diagnosis not present

## 2022-07-13 DIAGNOSIS — M1711 Unilateral primary osteoarthritis, right knee: Secondary | ICD-10-CM | POA: Diagnosis not present

## 2022-07-13 DIAGNOSIS — E039 Hypothyroidism, unspecified: Secondary | ICD-10-CM | POA: Diagnosis not present

## 2022-07-13 DIAGNOSIS — K219 Gastro-esophageal reflux disease without esophagitis: Secondary | ICD-10-CM | POA: Diagnosis not present

## 2022-07-13 DIAGNOSIS — J4520 Mild intermittent asthma, uncomplicated: Secondary | ICD-10-CM | POA: Diagnosis not present

## 2022-07-13 DIAGNOSIS — M5412 Radiculopathy, cervical region: Secondary | ICD-10-CM | POA: Diagnosis not present

## 2022-07-13 DIAGNOSIS — E669 Obesity, unspecified: Secondary | ICD-10-CM | POA: Diagnosis not present

## 2022-07-13 DIAGNOSIS — E1169 Type 2 diabetes mellitus with other specified complication: Secondary | ICD-10-CM | POA: Diagnosis not present

## 2022-07-13 DIAGNOSIS — N529 Male erectile dysfunction, unspecified: Secondary | ICD-10-CM | POA: Diagnosis not present

## 2022-07-13 DIAGNOSIS — I1310 Hypertensive heart and chronic kidney disease without heart failure, with stage 1 through stage 4 chronic kidney disease, or unspecified chronic kidney disease: Secondary | ICD-10-CM | POA: Diagnosis not present

## 2022-07-13 DIAGNOSIS — E785 Hyperlipidemia, unspecified: Secondary | ICD-10-CM | POA: Diagnosis not present

## 2022-07-13 DIAGNOSIS — T84013D Broken internal left knee prosthesis, subsequent encounter: Secondary | ICD-10-CM | POA: Diagnosis not present

## 2022-07-13 DIAGNOSIS — E0500 Thyrotoxicosis with diffuse goiter without thyrotoxic crisis or storm: Secondary | ICD-10-CM | POA: Diagnosis not present

## 2022-07-13 DIAGNOSIS — Z792 Long term (current) use of antibiotics: Secondary | ICD-10-CM | POA: Diagnosis not present

## 2022-07-13 DIAGNOSIS — E1122 Type 2 diabetes mellitus with diabetic chronic kidney disease: Secondary | ICD-10-CM | POA: Diagnosis not present

## 2022-07-13 DIAGNOSIS — Z7984 Long term (current) use of oral hypoglycemic drugs: Secondary | ICD-10-CM | POA: Diagnosis not present

## 2022-07-13 DIAGNOSIS — G4733 Obstructive sleep apnea (adult) (pediatric): Secondary | ICD-10-CM | POA: Diagnosis not present

## 2022-07-13 DIAGNOSIS — M5126 Other intervertebral disc displacement, lumbar region: Secondary | ICD-10-CM | POA: Diagnosis not present

## 2022-07-14 DIAGNOSIS — J309 Allergic rhinitis, unspecified: Secondary | ICD-10-CM | POA: Diagnosis not present

## 2022-07-14 DIAGNOSIS — I1310 Hypertensive heart and chronic kidney disease without heart failure, with stage 1 through stage 4 chronic kidney disease, or unspecified chronic kidney disease: Secondary | ICD-10-CM | POA: Diagnosis not present

## 2022-07-14 DIAGNOSIS — M5412 Radiculopathy, cervical region: Secondary | ICD-10-CM | POA: Diagnosis not present

## 2022-07-14 DIAGNOSIS — E0500 Thyrotoxicosis with diffuse goiter without thyrotoxic crisis or storm: Secondary | ICD-10-CM | POA: Diagnosis not present

## 2022-07-14 DIAGNOSIS — Z6830 Body mass index (BMI) 30.0-30.9, adult: Secondary | ICD-10-CM | POA: Diagnosis not present

## 2022-07-14 DIAGNOSIS — T84013D Broken internal left knee prosthesis, subsequent encounter: Secondary | ICD-10-CM | POA: Diagnosis not present

## 2022-07-14 DIAGNOSIS — Z7984 Long term (current) use of oral hypoglycemic drugs: Secondary | ICD-10-CM | POA: Diagnosis not present

## 2022-07-14 DIAGNOSIS — K219 Gastro-esophageal reflux disease without esophagitis: Secondary | ICD-10-CM | POA: Diagnosis not present

## 2022-07-14 DIAGNOSIS — N529 Male erectile dysfunction, unspecified: Secondary | ICD-10-CM | POA: Diagnosis not present

## 2022-07-14 DIAGNOSIS — D509 Iron deficiency anemia, unspecified: Secondary | ICD-10-CM | POA: Diagnosis not present

## 2022-07-14 DIAGNOSIS — M4802 Spinal stenosis, cervical region: Secondary | ICD-10-CM | POA: Diagnosis not present

## 2022-07-14 DIAGNOSIS — E669 Obesity, unspecified: Secondary | ICD-10-CM | POA: Diagnosis not present

## 2022-07-14 DIAGNOSIS — J4520 Mild intermittent asthma, uncomplicated: Secondary | ICD-10-CM | POA: Diagnosis not present

## 2022-07-14 DIAGNOSIS — E785 Hyperlipidemia, unspecified: Secondary | ICD-10-CM | POA: Diagnosis not present

## 2022-07-14 DIAGNOSIS — Z792 Long term (current) use of antibiotics: Secondary | ICD-10-CM | POA: Diagnosis not present

## 2022-07-14 DIAGNOSIS — E1122 Type 2 diabetes mellitus with diabetic chronic kidney disease: Secondary | ICD-10-CM | POA: Diagnosis not present

## 2022-07-14 DIAGNOSIS — N400 Enlarged prostate without lower urinary tract symptoms: Secondary | ICD-10-CM | POA: Diagnosis not present

## 2022-07-14 DIAGNOSIS — M5126 Other intervertebral disc displacement, lumbar region: Secondary | ICD-10-CM | POA: Diagnosis not present

## 2022-07-14 DIAGNOSIS — Z7901 Long term (current) use of anticoagulants: Secondary | ICD-10-CM | POA: Diagnosis not present

## 2022-07-14 DIAGNOSIS — M1711 Unilateral primary osteoarthritis, right knee: Secondary | ICD-10-CM | POA: Diagnosis not present

## 2022-07-14 DIAGNOSIS — N183 Chronic kidney disease, stage 3 unspecified: Secondary | ICD-10-CM | POA: Diagnosis not present

## 2022-07-14 DIAGNOSIS — E1169 Type 2 diabetes mellitus with other specified complication: Secondary | ICD-10-CM | POA: Diagnosis not present

## 2022-07-14 DIAGNOSIS — G4733 Obstructive sleep apnea (adult) (pediatric): Secondary | ICD-10-CM | POA: Diagnosis not present

## 2022-07-14 DIAGNOSIS — E039 Hypothyroidism, unspecified: Secondary | ICD-10-CM | POA: Diagnosis not present

## 2022-07-15 ENCOUNTER — Telehealth: Payer: Medicare HMO

## 2022-07-15 DIAGNOSIS — T84013D Broken internal left knee prosthesis, subsequent encounter: Secondary | ICD-10-CM | POA: Diagnosis not present

## 2022-07-15 DIAGNOSIS — E669 Obesity, unspecified: Secondary | ICD-10-CM | POA: Diagnosis not present

## 2022-07-15 DIAGNOSIS — E1169 Type 2 diabetes mellitus with other specified complication: Secondary | ICD-10-CM | POA: Diagnosis not present

## 2022-07-15 DIAGNOSIS — N400 Enlarged prostate without lower urinary tract symptoms: Secondary | ICD-10-CM | POA: Diagnosis not present

## 2022-07-15 DIAGNOSIS — N183 Chronic kidney disease, stage 3 unspecified: Secondary | ICD-10-CM | POA: Diagnosis not present

## 2022-07-15 DIAGNOSIS — M5126 Other intervertebral disc displacement, lumbar region: Secondary | ICD-10-CM | POA: Diagnosis not present

## 2022-07-15 DIAGNOSIS — E1122 Type 2 diabetes mellitus with diabetic chronic kidney disease: Secondary | ICD-10-CM | POA: Diagnosis not present

## 2022-07-15 DIAGNOSIS — K219 Gastro-esophageal reflux disease without esophagitis: Secondary | ICD-10-CM | POA: Diagnosis not present

## 2022-07-15 DIAGNOSIS — Z792 Long term (current) use of antibiotics: Secondary | ICD-10-CM | POA: Diagnosis not present

## 2022-07-15 DIAGNOSIS — M1711 Unilateral primary osteoarthritis, right knee: Secondary | ICD-10-CM | POA: Diagnosis not present

## 2022-07-15 DIAGNOSIS — M4802 Spinal stenosis, cervical region: Secondary | ICD-10-CM | POA: Diagnosis not present

## 2022-07-15 DIAGNOSIS — Z7901 Long term (current) use of anticoagulants: Secondary | ICD-10-CM | POA: Diagnosis not present

## 2022-07-15 DIAGNOSIS — E785 Hyperlipidemia, unspecified: Secondary | ICD-10-CM | POA: Diagnosis not present

## 2022-07-15 DIAGNOSIS — N529 Male erectile dysfunction, unspecified: Secondary | ICD-10-CM | POA: Diagnosis not present

## 2022-07-15 DIAGNOSIS — D509 Iron deficiency anemia, unspecified: Secondary | ICD-10-CM | POA: Diagnosis not present

## 2022-07-15 DIAGNOSIS — E0500 Thyrotoxicosis with diffuse goiter without thyrotoxic crisis or storm: Secondary | ICD-10-CM | POA: Diagnosis not present

## 2022-07-15 DIAGNOSIS — G4733 Obstructive sleep apnea (adult) (pediatric): Secondary | ICD-10-CM | POA: Diagnosis not present

## 2022-07-15 DIAGNOSIS — I1310 Hypertensive heart and chronic kidney disease without heart failure, with stage 1 through stage 4 chronic kidney disease, or unspecified chronic kidney disease: Secondary | ICD-10-CM | POA: Diagnosis not present

## 2022-07-15 DIAGNOSIS — Z6830 Body mass index (BMI) 30.0-30.9, adult: Secondary | ICD-10-CM | POA: Diagnosis not present

## 2022-07-15 DIAGNOSIS — J4520 Mild intermittent asthma, uncomplicated: Secondary | ICD-10-CM | POA: Diagnosis not present

## 2022-07-15 DIAGNOSIS — Z7984 Long term (current) use of oral hypoglycemic drugs: Secondary | ICD-10-CM | POA: Diagnosis not present

## 2022-07-15 DIAGNOSIS — J309 Allergic rhinitis, unspecified: Secondary | ICD-10-CM | POA: Diagnosis not present

## 2022-07-15 DIAGNOSIS — E039 Hypothyroidism, unspecified: Secondary | ICD-10-CM | POA: Diagnosis not present

## 2022-07-15 DIAGNOSIS — M5412 Radiculopathy, cervical region: Secondary | ICD-10-CM | POA: Diagnosis not present

## 2022-07-18 DIAGNOSIS — N400 Enlarged prostate without lower urinary tract symptoms: Secondary | ICD-10-CM | POA: Diagnosis not present

## 2022-07-18 DIAGNOSIS — Z6830 Body mass index (BMI) 30.0-30.9, adult: Secondary | ICD-10-CM | POA: Diagnosis not present

## 2022-07-18 DIAGNOSIS — E1169 Type 2 diabetes mellitus with other specified complication: Secondary | ICD-10-CM | POA: Diagnosis not present

## 2022-07-18 DIAGNOSIS — K219 Gastro-esophageal reflux disease without esophagitis: Secondary | ICD-10-CM | POA: Diagnosis not present

## 2022-07-18 DIAGNOSIS — J309 Allergic rhinitis, unspecified: Secondary | ICD-10-CM | POA: Diagnosis not present

## 2022-07-18 DIAGNOSIS — Z7901 Long term (current) use of anticoagulants: Secondary | ICD-10-CM | POA: Diagnosis not present

## 2022-07-18 DIAGNOSIS — M5126 Other intervertebral disc displacement, lumbar region: Secondary | ICD-10-CM | POA: Diagnosis not present

## 2022-07-18 DIAGNOSIS — N529 Male erectile dysfunction, unspecified: Secondary | ICD-10-CM | POA: Diagnosis not present

## 2022-07-18 DIAGNOSIS — E1122 Type 2 diabetes mellitus with diabetic chronic kidney disease: Secondary | ICD-10-CM | POA: Diagnosis not present

## 2022-07-18 DIAGNOSIS — E0500 Thyrotoxicosis with diffuse goiter without thyrotoxic crisis or storm: Secondary | ICD-10-CM | POA: Diagnosis not present

## 2022-07-18 DIAGNOSIS — M4802 Spinal stenosis, cervical region: Secondary | ICD-10-CM | POA: Diagnosis not present

## 2022-07-18 DIAGNOSIS — J4520 Mild intermittent asthma, uncomplicated: Secondary | ICD-10-CM | POA: Diagnosis not present

## 2022-07-18 DIAGNOSIS — Z7984 Long term (current) use of oral hypoglycemic drugs: Secondary | ICD-10-CM | POA: Diagnosis not present

## 2022-07-18 DIAGNOSIS — E669 Obesity, unspecified: Secondary | ICD-10-CM | POA: Diagnosis not present

## 2022-07-18 DIAGNOSIS — E785 Hyperlipidemia, unspecified: Secondary | ICD-10-CM | POA: Diagnosis not present

## 2022-07-18 DIAGNOSIS — Z792 Long term (current) use of antibiotics: Secondary | ICD-10-CM | POA: Diagnosis not present

## 2022-07-18 DIAGNOSIS — E039 Hypothyroidism, unspecified: Secondary | ICD-10-CM | POA: Diagnosis not present

## 2022-07-18 DIAGNOSIS — T84013D Broken internal left knee prosthesis, subsequent encounter: Secondary | ICD-10-CM | POA: Diagnosis not present

## 2022-07-18 DIAGNOSIS — G4733 Obstructive sleep apnea (adult) (pediatric): Secondary | ICD-10-CM | POA: Diagnosis not present

## 2022-07-18 DIAGNOSIS — M1711 Unilateral primary osteoarthritis, right knee: Secondary | ICD-10-CM | POA: Diagnosis not present

## 2022-07-18 DIAGNOSIS — M5412 Radiculopathy, cervical region: Secondary | ICD-10-CM | POA: Diagnosis not present

## 2022-07-18 DIAGNOSIS — I1310 Hypertensive heart and chronic kidney disease without heart failure, with stage 1 through stage 4 chronic kidney disease, or unspecified chronic kidney disease: Secondary | ICD-10-CM | POA: Diagnosis not present

## 2022-07-18 DIAGNOSIS — D509 Iron deficiency anemia, unspecified: Secondary | ICD-10-CM | POA: Diagnosis not present

## 2022-07-18 DIAGNOSIS — N183 Chronic kidney disease, stage 3 unspecified: Secondary | ICD-10-CM | POA: Diagnosis not present

## 2022-07-19 DIAGNOSIS — T84013D Broken internal left knee prosthesis, subsequent encounter: Secondary | ICD-10-CM | POA: Diagnosis not present

## 2022-07-20 DIAGNOSIS — Z7901 Long term (current) use of anticoagulants: Secondary | ICD-10-CM | POA: Diagnosis not present

## 2022-07-20 DIAGNOSIS — G4733 Obstructive sleep apnea (adult) (pediatric): Secondary | ICD-10-CM | POA: Diagnosis not present

## 2022-07-20 DIAGNOSIS — E1122 Type 2 diabetes mellitus with diabetic chronic kidney disease: Secondary | ICD-10-CM | POA: Diagnosis not present

## 2022-07-20 DIAGNOSIS — M5412 Radiculopathy, cervical region: Secondary | ICD-10-CM | POA: Diagnosis not present

## 2022-07-20 DIAGNOSIS — N183 Chronic kidney disease, stage 3 unspecified: Secondary | ICD-10-CM | POA: Diagnosis not present

## 2022-07-20 DIAGNOSIS — M1711 Unilateral primary osteoarthritis, right knee: Secondary | ICD-10-CM | POA: Diagnosis not present

## 2022-07-20 DIAGNOSIS — M5126 Other intervertebral disc displacement, lumbar region: Secondary | ICD-10-CM | POA: Diagnosis not present

## 2022-07-20 DIAGNOSIS — I1310 Hypertensive heart and chronic kidney disease without heart failure, with stage 1 through stage 4 chronic kidney disease, or unspecified chronic kidney disease: Secondary | ICD-10-CM | POA: Diagnosis not present

## 2022-07-20 DIAGNOSIS — Z792 Long term (current) use of antibiotics: Secondary | ICD-10-CM | POA: Diagnosis not present

## 2022-07-20 DIAGNOSIS — E039 Hypothyroidism, unspecified: Secondary | ICD-10-CM | POA: Diagnosis not present

## 2022-07-20 DIAGNOSIS — D509 Iron deficiency anemia, unspecified: Secondary | ICD-10-CM | POA: Diagnosis not present

## 2022-07-20 DIAGNOSIS — K219 Gastro-esophageal reflux disease without esophagitis: Secondary | ICD-10-CM | POA: Diagnosis not present

## 2022-07-20 DIAGNOSIS — N529 Male erectile dysfunction, unspecified: Secondary | ICD-10-CM | POA: Diagnosis not present

## 2022-07-20 DIAGNOSIS — J4520 Mild intermittent asthma, uncomplicated: Secondary | ICD-10-CM | POA: Diagnosis not present

## 2022-07-20 DIAGNOSIS — E1169 Type 2 diabetes mellitus with other specified complication: Secondary | ICD-10-CM | POA: Diagnosis not present

## 2022-07-20 DIAGNOSIS — N400 Enlarged prostate without lower urinary tract symptoms: Secondary | ICD-10-CM | POA: Diagnosis not present

## 2022-07-20 DIAGNOSIS — E0500 Thyrotoxicosis with diffuse goiter without thyrotoxic crisis or storm: Secondary | ICD-10-CM | POA: Diagnosis not present

## 2022-07-20 DIAGNOSIS — Z6830 Body mass index (BMI) 30.0-30.9, adult: Secondary | ICD-10-CM | POA: Diagnosis not present

## 2022-07-20 DIAGNOSIS — J309 Allergic rhinitis, unspecified: Secondary | ICD-10-CM | POA: Diagnosis not present

## 2022-07-20 DIAGNOSIS — E785 Hyperlipidemia, unspecified: Secondary | ICD-10-CM | POA: Diagnosis not present

## 2022-07-20 DIAGNOSIS — E669 Obesity, unspecified: Secondary | ICD-10-CM | POA: Diagnosis not present

## 2022-07-20 DIAGNOSIS — Z7984 Long term (current) use of oral hypoglycemic drugs: Secondary | ICD-10-CM | POA: Diagnosis not present

## 2022-07-20 DIAGNOSIS — T84013D Broken internal left knee prosthesis, subsequent encounter: Secondary | ICD-10-CM | POA: Diagnosis not present

## 2022-07-20 DIAGNOSIS — M4802 Spinal stenosis, cervical region: Secondary | ICD-10-CM | POA: Diagnosis not present

## 2022-07-22 DIAGNOSIS — N183 Chronic kidney disease, stage 3 unspecified: Secondary | ICD-10-CM | POA: Diagnosis not present

## 2022-07-22 DIAGNOSIS — G4733 Obstructive sleep apnea (adult) (pediatric): Secondary | ICD-10-CM | POA: Diagnosis not present

## 2022-07-22 DIAGNOSIS — N400 Enlarged prostate without lower urinary tract symptoms: Secondary | ICD-10-CM | POA: Diagnosis not present

## 2022-07-22 DIAGNOSIS — N529 Male erectile dysfunction, unspecified: Secondary | ICD-10-CM | POA: Diagnosis not present

## 2022-07-22 DIAGNOSIS — Z792 Long term (current) use of antibiotics: Secondary | ICD-10-CM | POA: Diagnosis not present

## 2022-07-22 DIAGNOSIS — Z7901 Long term (current) use of anticoagulants: Secondary | ICD-10-CM | POA: Diagnosis not present

## 2022-07-22 DIAGNOSIS — T84013D Broken internal left knee prosthesis, subsequent encounter: Secondary | ICD-10-CM | POA: Diagnosis not present

## 2022-07-22 DIAGNOSIS — K219 Gastro-esophageal reflux disease without esophagitis: Secondary | ICD-10-CM | POA: Diagnosis not present

## 2022-07-22 DIAGNOSIS — E669 Obesity, unspecified: Secondary | ICD-10-CM | POA: Diagnosis not present

## 2022-07-22 DIAGNOSIS — D509 Iron deficiency anemia, unspecified: Secondary | ICD-10-CM | POA: Diagnosis not present

## 2022-07-22 DIAGNOSIS — M1711 Unilateral primary osteoarthritis, right knee: Secondary | ICD-10-CM | POA: Diagnosis not present

## 2022-07-22 DIAGNOSIS — M5412 Radiculopathy, cervical region: Secondary | ICD-10-CM | POA: Diagnosis not present

## 2022-07-22 DIAGNOSIS — E039 Hypothyroidism, unspecified: Secondary | ICD-10-CM | POA: Diagnosis not present

## 2022-07-22 DIAGNOSIS — M5126 Other intervertebral disc displacement, lumbar region: Secondary | ICD-10-CM | POA: Diagnosis not present

## 2022-07-22 DIAGNOSIS — I1310 Hypertensive heart and chronic kidney disease without heart failure, with stage 1 through stage 4 chronic kidney disease, or unspecified chronic kidney disease: Secondary | ICD-10-CM | POA: Diagnosis not present

## 2022-07-22 DIAGNOSIS — E785 Hyperlipidemia, unspecified: Secondary | ICD-10-CM | POA: Diagnosis not present

## 2022-07-22 DIAGNOSIS — M4802 Spinal stenosis, cervical region: Secondary | ICD-10-CM | POA: Diagnosis not present

## 2022-07-22 DIAGNOSIS — E0500 Thyrotoxicosis with diffuse goiter without thyrotoxic crisis or storm: Secondary | ICD-10-CM | POA: Diagnosis not present

## 2022-07-22 DIAGNOSIS — Z7984 Long term (current) use of oral hypoglycemic drugs: Secondary | ICD-10-CM | POA: Diagnosis not present

## 2022-07-22 DIAGNOSIS — J4520 Mild intermittent asthma, uncomplicated: Secondary | ICD-10-CM | POA: Diagnosis not present

## 2022-07-22 DIAGNOSIS — J309 Allergic rhinitis, unspecified: Secondary | ICD-10-CM | POA: Diagnosis not present

## 2022-07-22 DIAGNOSIS — E1122 Type 2 diabetes mellitus with diabetic chronic kidney disease: Secondary | ICD-10-CM | POA: Diagnosis not present

## 2022-07-22 DIAGNOSIS — E1169 Type 2 diabetes mellitus with other specified complication: Secondary | ICD-10-CM | POA: Diagnosis not present

## 2022-07-22 DIAGNOSIS — Z6830 Body mass index (BMI) 30.0-30.9, adult: Secondary | ICD-10-CM | POA: Diagnosis not present

## 2022-07-23 DIAGNOSIS — Z96652 Presence of left artificial knee joint: Secondary | ICD-10-CM | POA: Diagnosis not present

## 2022-07-23 DIAGNOSIS — M1712 Unilateral primary osteoarthritis, left knee: Secondary | ICD-10-CM | POA: Diagnosis not present

## 2022-07-24 ENCOUNTER — Other Ambulatory Visit: Payer: Self-pay | Admitting: Internal Medicine

## 2022-07-24 DIAGNOSIS — M25562 Pain in left knee: Secondary | ICD-10-CM

## 2022-07-25 DIAGNOSIS — E1122 Type 2 diabetes mellitus with diabetic chronic kidney disease: Secondary | ICD-10-CM | POA: Diagnosis not present

## 2022-07-25 DIAGNOSIS — J309 Allergic rhinitis, unspecified: Secondary | ICD-10-CM | POA: Diagnosis not present

## 2022-07-25 DIAGNOSIS — M4802 Spinal stenosis, cervical region: Secondary | ICD-10-CM | POA: Diagnosis not present

## 2022-07-25 DIAGNOSIS — E669 Obesity, unspecified: Secondary | ICD-10-CM | POA: Diagnosis not present

## 2022-07-25 DIAGNOSIS — M5412 Radiculopathy, cervical region: Secondary | ICD-10-CM | POA: Diagnosis not present

## 2022-07-25 DIAGNOSIS — K219 Gastro-esophageal reflux disease without esophagitis: Secondary | ICD-10-CM | POA: Diagnosis not present

## 2022-07-25 DIAGNOSIS — Z792 Long term (current) use of antibiotics: Secondary | ICD-10-CM | POA: Diagnosis not present

## 2022-07-25 DIAGNOSIS — G4733 Obstructive sleep apnea (adult) (pediatric): Secondary | ICD-10-CM | POA: Diagnosis not present

## 2022-07-25 DIAGNOSIS — E1169 Type 2 diabetes mellitus with other specified complication: Secondary | ICD-10-CM | POA: Diagnosis not present

## 2022-07-25 DIAGNOSIS — D509 Iron deficiency anemia, unspecified: Secondary | ICD-10-CM | POA: Diagnosis not present

## 2022-07-25 DIAGNOSIS — N183 Chronic kidney disease, stage 3 unspecified: Secondary | ICD-10-CM | POA: Diagnosis not present

## 2022-07-25 DIAGNOSIS — Z7984 Long term (current) use of oral hypoglycemic drugs: Secondary | ICD-10-CM | POA: Diagnosis not present

## 2022-07-25 DIAGNOSIS — Z7901 Long term (current) use of anticoagulants: Secondary | ICD-10-CM | POA: Diagnosis not present

## 2022-07-25 DIAGNOSIS — J4520 Mild intermittent asthma, uncomplicated: Secondary | ICD-10-CM | POA: Diagnosis not present

## 2022-07-25 DIAGNOSIS — M1711 Unilateral primary osteoarthritis, right knee: Secondary | ICD-10-CM | POA: Diagnosis not present

## 2022-07-25 DIAGNOSIS — E039 Hypothyroidism, unspecified: Secondary | ICD-10-CM | POA: Diagnosis not present

## 2022-07-25 DIAGNOSIS — N400 Enlarged prostate without lower urinary tract symptoms: Secondary | ICD-10-CM | POA: Diagnosis not present

## 2022-07-25 DIAGNOSIS — I1310 Hypertensive heart and chronic kidney disease without heart failure, with stage 1 through stage 4 chronic kidney disease, or unspecified chronic kidney disease: Secondary | ICD-10-CM | POA: Diagnosis not present

## 2022-07-25 DIAGNOSIS — E785 Hyperlipidemia, unspecified: Secondary | ICD-10-CM | POA: Diagnosis not present

## 2022-07-25 DIAGNOSIS — T84013D Broken internal left knee prosthesis, subsequent encounter: Secondary | ICD-10-CM | POA: Diagnosis not present

## 2022-07-25 DIAGNOSIS — N529 Male erectile dysfunction, unspecified: Secondary | ICD-10-CM | POA: Diagnosis not present

## 2022-07-25 DIAGNOSIS — E0500 Thyrotoxicosis with diffuse goiter without thyrotoxic crisis or storm: Secondary | ICD-10-CM | POA: Diagnosis not present

## 2022-07-25 DIAGNOSIS — M5126 Other intervertebral disc displacement, lumbar region: Secondary | ICD-10-CM | POA: Diagnosis not present

## 2022-07-25 DIAGNOSIS — Z6830 Body mass index (BMI) 30.0-30.9, adult: Secondary | ICD-10-CM | POA: Diagnosis not present

## 2022-07-28 DIAGNOSIS — T84013D Broken internal left knee prosthesis, subsequent encounter: Secondary | ICD-10-CM | POA: Diagnosis not present

## 2022-07-29 DIAGNOSIS — M5412 Radiculopathy, cervical region: Secondary | ICD-10-CM | POA: Diagnosis not present

## 2022-07-29 DIAGNOSIS — N183 Chronic kidney disease, stage 3 unspecified: Secondary | ICD-10-CM | POA: Diagnosis not present

## 2022-07-29 DIAGNOSIS — M4802 Spinal stenosis, cervical region: Secondary | ICD-10-CM | POA: Diagnosis not present

## 2022-07-29 DIAGNOSIS — K219 Gastro-esophageal reflux disease without esophagitis: Secondary | ICD-10-CM | POA: Diagnosis not present

## 2022-07-29 DIAGNOSIS — N400 Enlarged prostate without lower urinary tract symptoms: Secondary | ICD-10-CM | POA: Diagnosis not present

## 2022-07-29 DIAGNOSIS — Z7984 Long term (current) use of oral hypoglycemic drugs: Secondary | ICD-10-CM | POA: Diagnosis not present

## 2022-07-29 DIAGNOSIS — E1169 Type 2 diabetes mellitus with other specified complication: Secondary | ICD-10-CM | POA: Diagnosis not present

## 2022-07-29 DIAGNOSIS — D509 Iron deficiency anemia, unspecified: Secondary | ICD-10-CM | POA: Diagnosis not present

## 2022-07-29 DIAGNOSIS — Z792 Long term (current) use of antibiotics: Secondary | ICD-10-CM | POA: Diagnosis not present

## 2022-07-29 DIAGNOSIS — J4520 Mild intermittent asthma, uncomplicated: Secondary | ICD-10-CM | POA: Diagnosis not present

## 2022-07-29 DIAGNOSIS — Z7901 Long term (current) use of anticoagulants: Secondary | ICD-10-CM | POA: Diagnosis not present

## 2022-07-29 DIAGNOSIS — E0500 Thyrotoxicosis with diffuse goiter without thyrotoxic crisis or storm: Secondary | ICD-10-CM | POA: Diagnosis not present

## 2022-07-29 DIAGNOSIS — J309 Allergic rhinitis, unspecified: Secondary | ICD-10-CM | POA: Diagnosis not present

## 2022-07-29 DIAGNOSIS — M1711 Unilateral primary osteoarthritis, right knee: Secondary | ICD-10-CM | POA: Diagnosis not present

## 2022-07-29 DIAGNOSIS — T84013D Broken internal left knee prosthesis, subsequent encounter: Secondary | ICD-10-CM | POA: Diagnosis not present

## 2022-07-29 DIAGNOSIS — E1122 Type 2 diabetes mellitus with diabetic chronic kidney disease: Secondary | ICD-10-CM | POA: Diagnosis not present

## 2022-07-29 DIAGNOSIS — I1310 Hypertensive heart and chronic kidney disease without heart failure, with stage 1 through stage 4 chronic kidney disease, or unspecified chronic kidney disease: Secondary | ICD-10-CM | POA: Diagnosis not present

## 2022-07-29 DIAGNOSIS — G4733 Obstructive sleep apnea (adult) (pediatric): Secondary | ICD-10-CM | POA: Diagnosis not present

## 2022-07-29 DIAGNOSIS — E785 Hyperlipidemia, unspecified: Secondary | ICD-10-CM | POA: Diagnosis not present

## 2022-07-29 DIAGNOSIS — M5126 Other intervertebral disc displacement, lumbar region: Secondary | ICD-10-CM | POA: Diagnosis not present

## 2022-07-29 DIAGNOSIS — N529 Male erectile dysfunction, unspecified: Secondary | ICD-10-CM | POA: Diagnosis not present

## 2022-07-29 DIAGNOSIS — E669 Obesity, unspecified: Secondary | ICD-10-CM | POA: Diagnosis not present

## 2022-07-29 DIAGNOSIS — E039 Hypothyroidism, unspecified: Secondary | ICD-10-CM | POA: Diagnosis not present

## 2022-07-29 DIAGNOSIS — Z6830 Body mass index (BMI) 30.0-30.9, adult: Secondary | ICD-10-CM | POA: Diagnosis not present

## 2022-08-02 NOTE — Therapy (Signed)
OUTPATIENT PHYSICAL THERAPY LOWER EXTREMITY EVALUATION   Patient Name: Ronald Bond MRN: [id number] DOB:1945-12-12, 76 y.o., male Today's Date: 08/03/2022   PT End of Session - 08/03/22 0933     Visit Number 1    Number of Visits 6    Date for PT Re-Evaluation 09/14/22    Authorization Type [employer] MCR    Progress Note Due on Visit 10    PT Start Time 0934    PT Stop Time 1019    PT Time Calculation (min) 45 min    Activity Tolerance Patient tolerated treatment well    Behavior During Therapy [facility] for tasks assessed/performed             Past Medical History:  Diagnosis Date   GERD (gastroesophageal reflux disease)    HTN (hypertension)    Hyperlipidemia    Hypothyroidism    Osteoarthritis    Pre-diabetes    Wears glasses    Past Surgical History:  Procedure Laterality Date   COLONOSCOPY     EYE SURGERY  2006   both cataracts   MICROLARYNGOSCOPY Left 11/18/2013   Procedure: MICROLARYNGOSCOPY WITH REMOVAL OF GRANULOMA LEFT SIDE;  Surgeon: Izora Gala, MD;  Location: [facility];  Service: ENT;  Laterality: Left;   NM MYOVIEW LTD  06/2007   Low risk, normal.  No evidence of ischemia or infarction   THYROIDECTOMY  1974   TONSILLECTOMY     as a child   TOTAL KNEE ARTHROPLASTY  2009   left   TOTAL KNEE REVISION Left 07/06/2022   Procedure: TOTAL KNEE REVISION;  Surgeon: Willaim Sheng, MD;  Location: [facility];  Service: Orthopedics;  Laterality: Left;   Patient Active Problem List   Diagnosis Date Noted   Intractable hiccoughs 07/08/2022   Failed total knee arthroplasty ([place]) 07/06/2022   PAC (premature atrial contraction) 12/27/2021   Encounter for general adult medical examination with abnormal findings 06/22/2021   Bradycardia 11/30/2020   Graves' orbitopathy 07/13/2020   Type II diabetes mellitus with manifestations ([place]) 05/13/2020   Graves disease 10/07/2019   Postablative hypothyroidism 10/07/2019   Thyroid-related proptosis 08/15/2019    Chronic renal disease, stage 3, moderately decreased glomerular filtration rate (GFR) between 30-59 mL/min/1.73 square meter ([place]) 08/15/2019   Long-term current use of opiate analgesic 05/14/2018   Bilateral epiphora 11/16/2017   Thiamine deficiency 06/08/2017   Obesity (BMI 30.0-34.9) 06/04/2017   Hypertensive heart disease without CHF (congestive heart failure) 12/25/2016   Right cervical radiculopathy 06/16/2016   Iron deficiency anemia 03/08/2016   Spinal stenosis in cervical region 08/25/2015   Allergic eczema 08/07/2014   Obstructive sleep apnea 11/12/2013   Mild intermittent asthma 06/03/2013   Herniated lumbar disc without myelopathy 10/18/2012   Allergic rhinitis 03/10/2011   Cardiomegaly 10/18/2010   ERECTILE DYSFUNCTION, NON-ORGANIC, MILD 06/29/2009   Hyperlipidemia associated with type 2 diabetes mellitus ([place]) 05/28/2009   Essential hypertension, benign 05/28/2009   GERD 05/28/2009   BPH associated with nocturia 05/28/2009   Primary osteoarthritis of right knee 05/28/2009    PCP: Janith Lima, MD   REFERRING PROVIDER: Willaim Sheng, MD   REFERRING DIAG: [phone] (ICD-10-CM) - Status post revision of total knee replacement, bilateral  THERAPY DIAG:  Stiffness of left knee, not elsewhere classified  Acute pain of left knee  Other abnormalities of gait and mobility  Rationale for Evaluation and Treatment Rehabilitation  ONSET DATE: 07/06/22  SUBJECTIVE:   SUBJECTIVE STATEMENT: Patient reports he no longer has  the pain or the limp he had prior to surgery. He still has some surgical pain.   PERTINENT HISTORY: HTN, OA, prediabetic. Left TKR 2008, Revision 2023  PAIN:  Are you having pain? Yes: NPRS scale: 0-4/10 Pain location: left knee  Pain description: aching Aggravating factors: wakes him from sleeping, activity Relieving factors: rest  PRECAUTIONS: None  WEIGHT BEARING RESTRICTIONS No  FALLS:  Has patient fallen in last 6 months?  No  LIVING ENVIRONMENT: Lives with: lives with their family Lives in: House/apartment Stairs: Yes: External: 4 steps; bilateral but cannot reach both Has following equipment at home: Single point cane and Walker - 2 wheeled  OCCUPATION: works for the [employer] system  PLOF: [place] resume his normal activities, walks about a mile during his day at work   OBJECTIVE:   DIAGNOSTIC FINDINGS: XR 07/06/22 Status post revision of total left knee arthroplasty without evidence of hardware failure.  PATIENT SURVEYS:  LEFS 45  COGNITION:  Overall cognitive status: Within functional limits for tasks assessed     SENSATION: WFL  EDEMA:  Circumferential: R knee  42 cm  L knee 46 cm  MUSCLE LENGTH: Mild tightness L HS  POSTURE: No Significant postural limitations  PALPATION: unremarkable  LOWER EXTREMITY ROM:   ROM Right eval Left Active eval Left Passive eval  Hip flexion  WFL   Hip extension  WFL   Hip abduction  WFL   Hip adduction  WFL   Hip internal rotation     Hip external rotation  [facility]   Knee flexion 117 110 117  Knee extension  -3 0  Ankle dorsiflexion  WNL    (Blank rows = not tested)  LOWER EXTREMITY MMT:  MMT Right eval Left eval  Hip flexion    Hip extension 5 5  Hip abduction 5 5  Hip adduction 5 5  Hip internal rotation    Hip external rotation    Knee flexion 5 4+  Knee extension 5 4+  Ankle dorsiflexion 5 5   (Blank rows = not tested)   FUNCTIONAL TESTS:   GAIT: Distance walked: 200 ft Assistive device utilized: Walker - 2 wheeled Level of assistance: Modified independence Comments: using 50% WB on LLE; also ascended/descended curb and got into his truck independently using walker.    TODAY'S TREATMENT: See PT ed   PATIENT EDUCATION:  Education details: Education on 50% WB using scale, Gait training for curbs/steps, HEP, POC discussed Person educated: Patient Education method: Explanation, Demonstration,  Verbal cues, and Handouts Education comprehension: verbalized understanding and returned demonstration   HOME EXERCISE PROGRAM: Access Code: [email or web address] URL: [email or web address] Date: 08/03/2022 Prepared by: Almyra Free  Exercises - Supine Hamstring Stretch with Strap  - 2 x daily - 7 x weekly - 1 sets - 3 reps - 60 sec hold - Supine Quadriceps Stretch with Strap on Table  - 2 x daily - 7 x weekly - 1 sets - 3 reps - 30-60 sec hold - Supine Knee Extension Strengthening  - 1 x daily - 7 x weekly - 2 sets - 10 reps - Supine Active Straight Leg Raise (Mirrored)  - 2 x daily - 7 x weekly - 3 sets - 10 reps - Prone Knee Flexion AROM  - 1 x daily - 7 x weekly - 3 sets - 10 reps  ASSESSMENT:  CLINICAL IMPRESSION: Ronald Bond is a 76 y.o. male who was seen today for physical therapy evaluation and treatment for revision  of left TKR on 07/06/22. He has a 50% WB restriction on the left LE until 08/17/22, however he walked in without AD today (walker was left in his truck). Pt was educated on restriction and clinic walker was utilized to assess gait and return pt safely to his vehicle after eval. Jeneen Rinks reports significant improvement in left knee pain since surgery although he still has surgical pain that is worse with increased activity.  He has mild deficits in left knee ROM compared to the R knee and mild strength deficits affecting gait, stairs and balance. He also has increased edema in the knee. Patient will benefit from skilled PT 1x/wk to address these deficits and to fully assess gait and balance after WB restrictions are lifted.    OBJECTIVE IMPAIRMENTS Abnormal gait, decreased balance, decreased ROM, decreased strength, increased edema, impaired flexibility, and pain.   ACTIVITY LIMITATIONS locomotion level  PARTICIPATION LIMITATIONS: occupation  PERSONAL FACTORS  N/A  are also affecting patient's functional outcome.   REHAB POTENTIAL: Excellent  CLINICAL DECISION MAKING:  Stable/uncomplicated  EVALUATION COMPLEXITY: Low   GOALS: Goals reviewed with patient? Yes   SHORT TERM GOALS: Target date: 08/17/2022  (Remove Blue Hyperlink)  Independent with initial HEP. Baseline:  Goal status: INITIAL  2. Balance goal set after 08/17/22. Baseline:  Goal status: INITIAL    LONG TERM GOALS: Target date: 09/14/2022 (Remove Blue Hyperlink)  Independent with advanced/ongoing HEP to improve outcomes and carryover.  Baseline:  Goal status: INITIAL  2.  [name] will demonstrate left knee flexion to 120 deg to ascend/descend stairs. Baseline:  Goal status: INITIAL  3.  [name] will demonstrate full L knee extension for safety with gait. Baseline:  Goal status: INITIAL  4.  [name] will be able to ambulate 600' safely without AD and normal gait pattern to access community.  Baseline:  Goal status: INITIAL  5.  [name] will be able to ascend/descend stairs with 1 HR and reciprocal step pattern safely to access home and community.  Baseline:  Goal status: INITIAL  6.  [name] will demonstrate >= 55 on LEFS to demonstrate improved mobility.  Baseline: 45 Goal status: INITIAL   PLAN: PT FREQUENCY: 2x/week  PT DURATION: 8 weeks  PLANNED INTERVENTIONS: Therapeutic exercises, Therapeutic activity, Neuromuscular re-education, Balance training, Gait training, Patient/Family education, Self Care, Joint mobilization, Stair training, Aquatic Therapy, Dry Needling, Electrical stimulation, Cryotherapy, Moist heat, scar mobilization, Taping, Vasopneumatic device, Ionotophoresis '4mg'$ /ml Dexamethasone, and Manual therapy  PLAN FOR NEXT SESSION: Review and progress HEP, gait as needed 50% WB on LLE until 08/17/22;  ROM/strength, balance after 08/17/22.   Amelda Hapke, PT 08/03/2022, 11:28 AM

## 2022-08-03 ENCOUNTER — Other Ambulatory Visit: Payer: Self-pay

## 2022-08-03 ENCOUNTER — Ambulatory Visit: Payer: Medicare HMO | Attending: Orthopedic Surgery | Admitting: Physical Therapy

## 2022-08-03 ENCOUNTER — Encounter: Payer: Self-pay | Admitting: Physical Therapy

## 2022-08-03 DIAGNOSIS — M25662 Stiffness of left knee, not elsewhere classified: Secondary | ICD-10-CM

## 2022-08-03 DIAGNOSIS — R2689 Other abnormalities of gait and mobility: Secondary | ICD-10-CM | POA: Diagnosis not present

## 2022-08-03 DIAGNOSIS — M25562 Pain in left knee: Secondary | ICD-10-CM | POA: Diagnosis not present

## 2022-08-09 ENCOUNTER — Ambulatory Visit: Payer: Medicare HMO | Admitting: Physical Therapy

## 2022-08-11 NOTE — Therapy (Signed)
OUTPATIENT PHYSICAL THERAPY TREATMENT   Patient Name: Ronald Bond MRN: [id number] DOB:11-09-1946, 76 y.o., male Today's Date: 08/12/2022   PT End of Session - 08/12/22 0846     Visit Number 2    Number of Visits 16    Date for PT Re-Evaluation 09/14/22    Authorization Type [employer] MCR    Progress Note Due on Visit 10    PT Start Time 0846    PT Stop Time 0928    PT Time Calculation (min) 42 min    Activity Tolerance Patient tolerated treatment well    Behavior During Therapy [facility] for tasks assessed/performed              Past Medical History:  Diagnosis Date   GERD (gastroesophageal reflux disease)    HTN (hypertension)    Hyperlipidemia    Hypothyroidism    Osteoarthritis    Pre-diabetes    Wears glasses    Past Surgical History:  Procedure Laterality Date   COLONOSCOPY     EYE SURGERY  2006   both cataracts   MICROLARYNGOSCOPY Left 11/18/2013   Procedure: MICROLARYNGOSCOPY WITH REMOVAL OF GRANULOMA LEFT SIDE;  Surgeon: Izora Gala, MD;  Location: [place];  Service: ENT;  Laterality: Left;   NM MYOVIEW LTD  06/2007   Low risk, normal.  No evidence of ischemia or infarction   THYROIDECTOMY  1974   TONSILLECTOMY     as a child   TOTAL KNEE ARTHROPLASTY  2009   left   TOTAL KNEE REVISION Left 07/06/2022   Procedure: TOTAL KNEE REVISION;  Surgeon: Willaim Sheng, MD;  Location: [facility];  Service: Orthopedics;  Laterality: Left;   Patient Active Problem List   Diagnosis Date Noted   Intractable hiccoughs 07/08/2022   Failed total knee arthroplasty ([place]) 07/06/2022   PAC (premature atrial contraction) 12/27/2021   Encounter for general adult medical examination with abnormal findings 06/22/2021   Bradycardia 11/30/2020   Graves' orbitopathy 07/13/2020   Type II diabetes mellitus with manifestations ([place]) 05/13/2020   Graves disease 10/07/2019   Postablative hypothyroidism 10/07/2019   Thyroid-related proptosis 08/15/2019   Chronic  renal disease, stage 3, moderately decreased glomerular filtration rate (GFR) between 30-59 mL/min/1.73 square meter ([place]) 08/15/2019   Long-term current use of opiate analgesic 05/14/2018   Bilateral epiphora 11/16/2017   Thiamine deficiency 06/08/2017   Obesity (BMI 30.0-34.9) 06/04/2017   Hypertensive heart disease without CHF (congestive heart failure) 12/25/2016   Right cervical radiculopathy 06/16/2016   Iron deficiency anemia 03/08/2016   Spinal stenosis in cervical region 08/25/2015   Allergic eczema 08/07/2014   Obstructive sleep apnea 11/12/2013   Mild intermittent asthma 06/03/2013   Herniated lumbar disc without myelopathy 10/18/2012   Allergic rhinitis 03/10/2011   Cardiomegaly 10/18/2010   ERECTILE DYSFUNCTION, NON-ORGANIC, MILD 06/29/2009   Hyperlipidemia associated with type 2 diabetes mellitus ([place]) 05/28/2009   Essential hypertension, benign 05/28/2009   GERD 05/28/2009   BPH associated with nocturia 05/28/2009   Primary osteoarthritis of right knee 05/28/2009    PCP: Janith Lima, MD   REFERRING PROVIDER: Willaim Sheng, MD   REFERRING DIAG: [phone] (ICD-10-CM) - Status post revision of total knee replacement, bilateral  THERAPY DIAG:  Stiffness of left knee, not elsewhere classified  Acute pain of left knee  Other abnormalities of gait and mobility  RATIONALE FOR EVALUATION AND TREATMENT: Rehabilitation  ONSET DATE: 07/06/22  NEXT MD VISIT: 08/18/22   SUBJECTIVE:   SUBJECTIVE STATEMENT: Pt  reports he has been trying to keep his [place] on the L knee to 50% "to the best of my ability."  PAIN:  Are you having pain? No  PERTINENT HISTORY: HTN, OA, prediabetic. Left TKR 2008, Revision 2023  PRECAUTIONS: None  WEIGHT BEARING RESTRICTIONS Yes 50% WBing on L until 6 weeks post-op - 08/17/22  FALLS:  Has patient fallen in last 6 months? No  LIVING ENVIRONMENT: Lives with: lives with their family Lives in: House/apartment Stairs: Yes:  External: 4 steps; bilateral but cannot reach both Has following equipment at home: Single point cane and Walker - 2 wheeled  OCCUPATION: works for the [employer] system  PLOF: [place] resume his normal activities, walks about a mile during his day at work   OBJECTIVE:   DIAGNOSTIC FINDINGS:  XR 07/06/22: Status post revision of total left knee arthroplasty without evidence of hardware failure.  PATIENT SURVEYS:  LEFS 45  COGNITION:  Overall cognitive status: Within functional limits for tasks assessed     SENSATION: WFL  EDEMA:  Circumferential: R knee  42 cm  L knee 46 cm  MUSCLE LENGTH: Mild tightness L HS  POSTURE: No Significant postural limitations  PALPATION: unremarkable  LOWER EXTREMITY ROM:   AROM Right eval Left  eval Left 08/12/22  Hip flexion  WFL   Hip extension  WFL   Hip abduction  WFL   Hip adduction  WFL   Hip internal rotation     Hip external rotation  WFL   Knee flexion 117 110 128  Knee extension  -3 0  Ankle dorsiflexion  WNL      PROM Left  eval  Hip flexion   Hip extension   Hip abduction   Hip adduction   Hip internal rotation   Hip external rotation   Knee flexion 117  Knee extension 0  Ankle dorsiflexion    (Blank rows = not tested)  LOWER EXTREMITY MMT:  MMT Right eval Left eval  Hip flexion    Hip extension 5 5  Hip abduction 5 5  Hip adduction 5 5  Hip internal rotation    Hip external rotation    Knee flexion 5 4+  Knee extension 5 4+  Ankle dorsiflexion 5 5   (Blank rows = not tested)   FUNCTIONAL TESTS:   GAIT: Distance walked: 200 ft Assistive device utilized: Walker - 2 wheeled Level of assistance: Modified independence Comments: using 50% WB on LLE; also ascended/descended curb and got into his truck independently using walker.    TODAY'S TREATMENT:  08/12/22 THERAPEUTIC EXERCISE: to improve flexibility, strength and mobility.  Verbal and tactile cues throughout for  technique.  NuStep - L4 x 6 min Supine L HS stretch with strap 2 x 60" Mod thomas L quad/hip flexor stretch 2 x 60" Supine L SAQ over 8" FR 2 x 10, 2nd set with 2# ankle cuff weight Supine L quad set + SLR 2#  2 x10 Prone L knee flexion 2#  2 x 10 Prone L hip extension 2#  2 x 10 Sidelying L hip adduction 2#  2 x 10 AAROM L knee flexion HS curls with strap and heels on peanut ball x 20  MANUAL THERAPY: To promote increased ROM. Gentle overpressure into L knee extension ROM    PATIENT EDUCATION:  Education details: Education on 50% WB using scale, Gait training for curbs/steps, HEP, POC discussed Person educated: Patient Education method: Explanation, Demonstration, Verbal cues, and Handouts Education  comprehension: verbalized understanding and returned demonstration   HOME EXERCISE PROGRAM: Access Code: [email or web address] URL: [email or web address] Date: 08/03/2022 Prepared by: Almyra Free  Exercises - Supine Hamstring Stretch with Strap  - 2 x daily - 7 x weekly - 1 sets - 3 reps - 60 sec hold - Supine Quadriceps Stretch with Strap on Table  - 2 x daily - 7 x weekly - 1 sets - 3 reps - 30-60 sec hold - Supine Knee Extension Strengthening  - 1 x daily - 7 x weekly - 2 sets - 10 reps - Supine Active Straight Leg Raise (Mirrored)  - 2 x daily - 7 x weekly - 3 sets - 10 reps - Prone Knee Flexion AROM  - 1 x daily - 7 x weekly - 3 sets - 10 reps  ASSESSMENT:  CLINICAL IMPRESSION: Quartez reports he has been trying to comply with the 50% WBing on the left as prescribed by the MD - gait with RW showing weight more to R allowing for appropriate off-loading on L. Initial HEP reviewed with only minor clarifications for positioning, movement patterns and hold times. Able to progress strengthening with addition 2# cuff weights in supine but deferred standing exercises due to current WBing restrictions. L knee ROM improving with significantly improved flexion ROM to 128 with extension down to  0 after PROM stretching. [place] will continue to benefit from skilled PT to improve L knee stability along with progression to weaning AD with gait and balance assessment/training once cleared to advance to FWB on L.  OBJECTIVE IMPAIRMENTS Abnormal gait, decreased balance, decreased ROM, decreased strength, increased edema, impaired flexibility, and pain.   ACTIVITY LIMITATIONS locomotion level  PARTICIPATION LIMITATIONS: occupation  PERSONAL FACTORS  N/A  are also affecting patient's functional outcome.   REHAB POTENTIAL: Excellent  CLINICAL DECISION MAKING: Stable/uncomplicated  EVALUATION COMPLEXITY: Low   GOALS: Goals reviewed with patient? Yes   SHORT TERM GOALS: Target date: 08/17/2022    Independent with initial HEP. Baseline:  Goal status: IN PROGRESS  2. Balance goal set after 08/17/22. Baseline:  Goal status: INITIAL    LONG TERM GOALS: Target date: 09/14/2022   Independent with advanced/ongoing HEP to improve outcomes and carryover.  Baseline:  Goal status: IN PROGRESS  2.  [name] will demonstrate left knee flexion to 120 deg to ascend/descend stairs. Baseline:  Goal status: IN PROGRESS  3.  [name] will demonstrate full L knee extension for safety with gait. Baseline:  Goal status: IN PROGRESS  4.  [name] will be able to ambulate 600' safely without AD and normal gait pattern to access community.  Baseline:  Goal status: IN PROGRESS  5.  [name] will be able to ascend/descend stairs with 1 HR and reciprocal step pattern safely to access home and community.  Baseline:  Goal status: IN PROGRESS  6.  [name] will demonstrate >= 55 on LEFS to demonstrate improved mobility.  Baseline: 45 Goal status: IN PROGRESS   PLAN: PT FREQUENCY: 2x/week  PT DURATION: 8 weeks  PLANNED INTERVENTIONS: Therapeutic exercises, Therapeutic activity, Neuromuscular re-education, Balance training, Gait training,  Patient/Family education, Self Care, Joint mobilization, Stair training, Aquatic Therapy, Dry Needling, Electrical stimulation, Cryotherapy, Moist heat, scar mobilization, Taping, Vasopneumatic device, Ionotophoresis '4mg'$ /ml Dexamethasone, and Manual therapy  PLAN FOR NEXT SESSION: ROM/strength for L knee stability - review and progress HEP as indicated, gait as needed 50% WB on LLE until 08/17/22; balance after 08/17/22.   Percival Spanish,  PT 08/12/2022, 11:53 AM

## 2022-08-12 ENCOUNTER — Ambulatory Visit: Payer: Medicare HMO | Admitting: Physical Therapy

## 2022-08-12 ENCOUNTER — Other Ambulatory Visit: Payer: Self-pay | Admitting: Internal Medicine

## 2022-08-12 ENCOUNTER — Encounter: Payer: Self-pay | Admitting: Physical Therapy

## 2022-08-12 DIAGNOSIS — T502X5A Adverse effect of carbonic-anhydrase inhibitors, benzothiadiazides and other diuretics, initial encounter: Secondary | ICD-10-CM

## 2022-08-12 DIAGNOSIS — M25562 Pain in left knee: Secondary | ICD-10-CM

## 2022-08-12 DIAGNOSIS — R2689 Other abnormalities of gait and mobility: Secondary | ICD-10-CM

## 2022-08-12 DIAGNOSIS — I10 Essential (primary) hypertension: Secondary | ICD-10-CM

## 2022-08-12 DIAGNOSIS — M25662 Stiffness of left knee, not elsewhere classified: Secondary | ICD-10-CM | POA: Diagnosis not present

## 2022-08-15 ENCOUNTER — Ambulatory Visit: Payer: Medicare HMO | Admitting: Physical Therapy

## 2022-08-15 ENCOUNTER — Encounter: Payer: Self-pay | Admitting: Physical Therapy

## 2022-08-15 DIAGNOSIS — M25662 Stiffness of left knee, not elsewhere classified: Secondary | ICD-10-CM | POA: Diagnosis not present

## 2022-08-15 DIAGNOSIS — M25562 Pain in left knee: Secondary | ICD-10-CM | POA: Diagnosis not present

## 2022-08-15 DIAGNOSIS — R2689 Other abnormalities of gait and mobility: Secondary | ICD-10-CM | POA: Diagnosis not present

## 2022-08-15 NOTE — Therapy (Signed)
OUTPATIENT PHYSICAL THERAPY TREATMENT / PROGRESS NOTE   Patient Name: Ronald Bond MRN: [id number] DOB:22-May-1946, 76 y.o., male Today's Date: 08/15/2022  Progress Note  Reporting Period 08/03/2022 to 08/15/2022  See note below for Objective Data and Assessment of Progress/Goals.      PT End of Session - 08/15/22 0929     Visit Number 3    Number of Visits 16    Date for PT Re-Evaluation 09/14/22    Authorization Type [employer]et[employer] MCR    Progress Note Due on Visit 10    PT Start Time 0929    PT Stop Time 1015    PT Time Calculation (min) 46 min    Activity Tolerance Patient tolerated treatment well    Behavior During Therapy [facility] for tasks assessed/performed              Past Medical History:  Diagnosis Date   GERD (gastroesophageal reflux disease)    HTN (hypertension)    Hyperlipidemia    Hypothyroidism    Osteoarthritis    Pre-diabetes    Wears glasses    Past Surgical History:  Procedure Laterality Date   COLONOSCOPY     EYE SURGERY  2006   both cataracts   MICROLARYNGOSCOPY Left 11/18/2013   Procedure: MICROLARYNGOSCOPY WITH REMOVAL OF GRANULOMA LEFT SIDE;  Surgeon: Izora Gala, MD;  Location: [place];  Service: ENT;  Laterality: Left;   NM MYOVIEW LTD  06/2007   Low risk, normal.  No evidence of ischemia or infarction   THYROIDECTOMY  1974   TONSILLECTOMY     as a child   TOTAL KNEE ARTHROPLASTY  2009   left   TOTAL KNEE REVISION Left 07/06/2022   Procedure: TOTAL KNEE REVISION;  Surgeon: Willaim Sheng, MD;  Location: [facility];  Service: Orthopedics;  Laterality: Left;   Patient Active Problem List   Diagnosis Date Noted   Intractable hiccoughs 07/08/2022   Failed total knee arthroplasty ([place]) 07/06/2022   PAC (premature atrial contraction) 12/27/2021   Encounter for general adult medical examination with abnormal findings 06/22/2021   Bradycardia 11/30/2020   Graves' orbitopathy 07/13/2020   Type II diabetes mellitus with  manifestations ([place]) 05/13/2020   Graves disease 10/07/2019   Postablative hypothyroidism 10/07/2019   Thyroid-related proptosis 08/15/2019   Chronic renal disease, stage 3, moderately decreased glomerular filtration rate (GFR) between 30-59 mL/min/1.73 square meter ([place]) 08/15/2019   Long-term current use of opiate analgesic 05/14/2018   Bilateral epiphora 11/16/2017   Thiamine deficiency 06/08/2017   Obesity (BMI 30.0-34.9) 06/04/2017   Hypertensive heart disease without CHF (congestive heart failure) 12/25/2016   Right cervical radiculopathy 06/16/2016   Iron deficiency anemia 03/08/2016   Spinal stenosis in cervical region 08/25/2015   Allergic eczema 08/07/2014   Obstructive sleep apnea 11/12/2013   Mild intermittent asthma 06/03/2013   Herniated lumbar disc without myelopathy 10/18/2012   Allergic rhinitis 03/10/2011   Cardiomegaly 10/18/2010   ERECTILE DYSFUNCTION, NON-ORGANIC, MILD 06/29/2009   Hyperlipidemia associated with type 2 diabetes mellitus ([place]) 05/28/2009   Essential hypertension, benign 05/28/2009   GERD 05/28/2009   BPH associated with nocturia 05/28/2009   Primary osteoarthritis of right knee 05/28/2009    PCP: Janith Lima, MD   REFERRING PROVIDER: Willaim Sheng, MD   REFERRING DIAG: [phone] (ICD-10-CM) - Status post revision of total knee replacement, bilateral  THERAPY DIAG:  Stiffness of left knee, not elsewhere classified  Acute pain of left knee  Other abnormalities of gait  and mobility  RATIONALE FOR EVALUATION AND TREATMENT: Rehabilitation  ONSET DATE: 07/06/22  NEXT MD VISIT: 08/18/22   SUBJECTIVE:   SUBJECTIVE STATEMENT: Pt reports his knee gets achy at night but typically no pain during the day.  PAIN:  Are you having pain? No  PERTINENT HISTORY: HTN, OA, prediabetic. Left TKR 2008, Revision 2023  PRECAUTIONS: None  WEIGHT BEARING RESTRICTIONS Yes 50% WBing on L until 6 weeks post-op - 08/17/22  FALLS:  Has patient  fallen in last 6 months? 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  PATIENT SURVEYS:  LEFS 45  COGNITION:  Overall cognitive status: Within functional limits for tasks assessed     SENSATION: WFL  EDEMA:  Circumferential: R knee  42 cm  L knee 46 cm  MUSCLE LENGTH: Mild tightness L HS  POSTURE: No Significant postural limitations  PALPATION: unremarkable  LOWER EXTREMITY ROM:   AROM Right eval Left  eval Left 08/12/22 Left 08/15/22  Hip flexion  WFL    Hip extension  WFL    Hip abduction  WFL    Hip adduction  WFL    Hip internal rotation      Hip external rotation  WFL    Knee flexion 117 110 128 129  Knee extension  -3 0 0 supine -5 seated  Ankle dorsiflexion  WNL       PROM Left  eval  Hip flexion   Hip extension   Hip abduction   Hip adduction   Hip internal rotation   Hip external rotation   Knee flexion 117  Knee extension 0  Ankle dorsiflexion    (Blank rows = not tested)  LOWER EXTREMITY MMT:  MMT Right eval Left eval  Hip flexion    Hip extension 5 5  Hip abduction 5 5  Hip adduction 5 5  Hip internal rotation    Hip external rotation    Knee flexion 5 4+  Knee extension 5 4+  Ankle dorsiflexion 5 5   (Blank rows = not tested)   FUNCTIONAL TESTS:   GAIT: Distance walked: 200 ft Assistive device utilized: Walker - 2 wheeled Level of assistance: Modified independence Comments: using 50% WB on LLE; also ascended/descended curb and got into his truck  independently using walker.    TODAY'S TREATMENT:  08/15/22 THERAPEUTIC EXERCISE: to improve flexibility, strength and mobility.  Verbal and tactile cues throughout for technique. Rec Bike - L2 x 6 min Seated hip add ball squeeze + L/R LAQ with 3# 2 x 10 Seated GTB alt hip ABD/ER unilateral clam 2 x 10 Seated GTB L/R HS curls 2 x 10 Seated GTB + 3# ankle cuff wt hip flexion march 2 x 10 Supine L TKE into small green ball 10 x 5" 2 sets Supine L SAQ over 8" FR with 3# ankle cuff wt 2 x 10 Supine L quad set + SLR 3# 2 x10 Supine B knee flexion HS curls with heels on peanut ball and 3# ankle weights x 20 Seated L hip hinge  HS stretch 2 x 30"   08/12/22 THERAPEUTIC EXERCISE: to improve flexibility, strength and mobility.  Verbal and tactile cues throughout for technique.  NuStep - L4 x 6 min Supine L HS stretch with strap 2 x 60" Mod thomas L quad/hip flexor stretch 2 x 60" Supine L SAQ over 8" FR 2 x 10, 2nd set with 2# ankle cuff weight Supine L quad set + SLR 2#  2 x10 Prone L knee flexion 2#  2 x 10 Prone L hip extension 2#  2 x 10 Sidelying L hip adduction 2#  2 x 10 AAROM L knee flexion HS curls with strap and heels on peanut ball x 20  MANUAL THERAPY: To promote increased ROM. Gentle overpressure into L knee extension ROM    PATIENT EDUCATION:  Education details: Education on 50% WB using scale, Gait training for curbs/steps, HEP, POC discussed Person educated: Patient Education method: Explanation, Demonstration, Verbal cues, and Handouts Education comprehension: verbalized understanding and returned demonstration   HOME EXERCISE PROGRAM: Access Code: [email or web address] URL: [email or web address] Date: 08/03/2022 Prepared by: Almyra Free  Exercises - Supine Hamstring Stretch with Strap  - 2 x daily - 7 x weekly - 1 sets - 3 reps - 60 sec hold - Supine Quadriceps Stretch with Strap on Table  - 2 x daily - 7 x weekly - 1 sets - 3 reps - 30-60 sec hold - Supine Knee  Extension Strengthening  - 1 x daily - 7 x weekly - 2 sets - 10 reps - Supine Active Straight Leg Raise (Mirrored)  - 2 x daily - 7 x weekly - 3 sets - 10 reps - Prone Knee Flexion AROM  - 1 x daily - 7 x weekly - 3 sets - 10 reps  ASSESSMENT:  CLINICAL IMPRESSION: Abdinasir reports minimal to no pain during the day but states his knee sometimes get achy at night. He demonstrates excellent L knee ROM at 0-129 in supine (lacking 5 extension in LAQ) but slight click/clunk noted behind patella as he moves into mid/end range flexion ROM and slight quad laq still evident during SLR. Exercise focus remains centered on seated and supine exercises due to 50% WBing restriction but good tolerance for all exercises. He will be ready to transition to more standing exercises and balance activities once cleared by MD to advance [place].  OBJECTIVE IMPAIRMENTS Abnormal gait, decreased balance, decreased ROM, decreased strength, increased edema, impaired flexibility, and pain.   ACTIVITY LIMITATIONS locomotion level  PARTICIPATION LIMITATIONS: occupation  PERSONAL FACTORS  N/A  are also affecting patient's functional outcome.   REHAB POTENTIAL: Excellent  CLINICAL DECISION MAKING: Stable/uncomplicated  EVALUATION COMPLEXITY: Low   GOALS: Goals reviewed with patient? Yes   SHORT TERM GOALS: Target date: 08/17/2022    Independent with initial HEP. Baseline:  Goal status: IN PROGRESS  2. Balance goal set after 08/17/22. Baseline:  Goal status: INITIAL    LONG TERM GOALS: Target date: 09/14/2022   Independent with advanced/ongoing HEP to improve outcomes and carryover.  Baseline:  Goal status: IN PROGRESS  2.  [name] will demonstrate left knee flexion to 120 deg to ascend/descend stairs. Baseline:  Goal status: PARTIALLY MET  08/15/22 - L knee flexion 129 but stairs not yet attempted reciprocally due to [place] restriction  3.  [name] will demonstrate full L knee extension  for safety with gait. Baseline:  Goal status: PARTIALLY MET  08/15/22 - L knee extension 0 in supine but lacking 5-6 in seated  [place] will be able to ambulate 600' safely without AD and normal gait pattern to access community.  Baseline:  Goal status: IN PROGRESS  5.  [name] will be able to ascend/descend stairs with 1 HR and reciprocal step pattern safely to access home and community.  Baseline:  Goal status: IN PROGRESS  6.  [name] will demonstrate >= 55 on LEFS to demonstrate improved mobility.  Baseline: 45 Goal status: IN PROGRESS   PLAN: PT FREQUENCY: 2x/week  PT DURATION: 8 weeks  PLANNED INTERVENTIONS: Therapeutic exercises, Therapeutic activity, Neuromuscular re-education, Balance training, Gait training, Patient/Family education, Self Care, Joint mobilization, Stair training, Aquatic Therapy, Dry Needling, Electrical stimulation, Cryotherapy, Moist heat, scar mobilization, Taping, Vasopneumatic device, Ionotophoresis 7m/ml Dexamethasone, and Manual therapy  PLAN FOR NEXT SESSION: ROM/strength for L knee stability - review and progress HEP as indicated, gait as needed 50% WB on LLE until 08/17/22; balance after 08/17/22.   JPercival Spanish PT 08/15/2022, 8:16 PM

## 2022-08-16 ENCOUNTER — Encounter: Payer: Medicare HMO | Admitting: Physical Therapy

## 2022-08-19 ENCOUNTER — Ambulatory Visit: Payer: Medicare HMO | Admitting: Physical Therapy

## 2022-08-20 ENCOUNTER — Other Ambulatory Visit: Payer: Self-pay | Admitting: Internal Medicine

## 2022-08-20 DIAGNOSIS — M25562 Pain in left knee: Secondary | ICD-10-CM

## 2022-08-22 NOTE — Therapy (Signed)
OUTPATIENT PHYSICAL THERAPY TREATMENT / PROGRESS NOTE   Patient Name: Ronald Bond MRN: [id number] DOB:[birth year], 76 y.o., male Today's Date: 08/22/2022  Progress Note  Reporting Period 08/03/2022 to 08/15/2022  See note below for Objective Data and Assessment of Progress/Goals.         Past Medical History:  Diagnosis Date   GERD (gastroesophageal reflux disease)    HTN (hypertension)    Hyperlipidemia    Hypothyroidism    Osteoarthritis    Pre-diabetes    Wears glasses    Past Surgical History:  Procedure Laterality Date   COLONOSCOPY     EYE SURGERY  2006   both cataracts   MICROLARYNGOSCOPY Left 11/18/2013   Procedure: MICROLARYNGOSCOPY WITH REMOVAL OF GRANULOMA LEFT SIDE;  Surgeon: Izora Gala, MD;  Location: [facility];  Service: ENT;  Laterality: Left;   NM MYOVIEW LTD  06/2007   Low risk, normal.  No evidence of ischemia or infarction   THYROIDECTOMY  1974   TONSILLECTOMY     as a child   TOTAL KNEE ARTHROPLASTY  2009   left   TOTAL KNEE REVISION Left 07/06/2022   Procedure: TOTAL KNEE REVISION;  Surgeon: Willaim Sheng, MD;  Location: [facility];  Service: Orthopedics;  Laterality: Left;   Patient Active Problem List   Diagnosis Date Noted   Intractable hiccoughs 07/08/2022   Failed total knee arthroplasty ([place]) 07/06/2022   PAC (premature atrial contraction) 12/27/2021   Encounter for general adult medical examination with abnormal findings 06/22/2021   Bradycardia 11/30/2020   Graves' orbitopathy 07/13/2020   Type II diabetes mellitus with manifestations ([place]) 05/13/2020   Graves disease 10/07/2019   Postablative hypothyroidism 10/07/2019   Thyroid-related proptosis 08/15/2019   Chronic renal disease, stage 3, moderately decreased glomerular filtration rate (GFR) between 30-59 mL/min/1.73 square meter ([place]) 08/15/2019   Long-term current use of opiate analgesic 05/14/2018   Bilateral epiphora 11/16/2017   Thiamine deficiency  06/08/2017   Obesity (BMI 30.0-34.9) 06/04/2017   Hypertensive heart disease without CHF (congestive heart failure) 12/25/2016   Right cervical radiculopathy 06/16/2016   Iron deficiency anemia 03/08/2016   Spinal stenosis in cervical region 08/25/2015   Allergic eczema 08/07/2014   Obstructive sleep apnea 11/12/2013   Mild intermittent asthma 06/03/2013   Herniated lumbar disc without myelopathy 10/18/2012   Allergic rhinitis 03/10/2011   Cardiomegaly 10/18/2010   ERECTILE DYSFUNCTION, NON-ORGANIC, MILD 06/29/2009   Hyperlipidemia associated with type 2 diabetes mellitus ([place]) 05/28/2009   Essential hypertension, benign 05/28/2009   GERD 05/28/2009   BPH associated with nocturia 05/28/2009   Primary osteoarthritis of right knee 05/28/2009    PCP: Janith Lima, MD   REFERRING PROVIDER: Willaim Sheng, MD   REFERRING DIAG: [phone] (ICD-10-CM) - Status post revision of total knee replacement, bilateral  THERAPY DIAG:  No diagnosis found.  RATIONALE FOR EVALUATION AND TREATMENT: Rehabilitation  ONSET DATE: 07/06/22  NEXT MD VISIT: 08/18/22   SUBJECTIVE:   SUBJECTIVE STATEMENT: ***  PAIN:  Are you having pain? No  PERTINENT HISTORY: HTN, OA, prediabetic. Left TKR 2008, Revision 2023  PRECAUTIONS: None  WEIGHT BEARING RESTRICTIONS Yes 50% WBing on L until 6 weeks post-op - 08/17/22  FALLS:  Has patient fallen in last 6 months? No  LIVING ENVIRONMENT: Lives with: lives with their family Lives in: House/apartment Stairs: Yes: External: 4 steps; bilateral but cannot reach both Has following equipment at home: Single point cane and Walker - 2 wheeled  OCCUPATION: works for the  [employer]  PLOF: Independent  PATIENT GOALS resume his normal activities, walks about a mile during his day at work   OBJECTIVE:   DIAGNOSTIC FINDINGS:  XR 07/06/22: Status post revision of total left knee arthroplasty without evidence of hardware failure.  PATIENT  SURVEYS:  LEFS 45  COGNITION:  Overall cognitive status: Within functional limits for tasks assessed     SENSATION: WFL  EDEMA:  Circumferential: R knee  42 cm  L knee 46 cm  MUSCLE LENGTH: Mild tightness L HS  POSTURE: No Significant postural limitations  PALPATION: unremarkable  LOWER EXTREMITY ROM:   AROM Right eval Left  eval Left 08/12/22 Left 08/15/22  Hip flexion  WFL    Hip extension  WFL    Hip abduction  WFL    Hip adduction  WFL    Hip internal rotation      Hip external rotation  WFL    Knee flexion 117 110 128 129  Knee extension  -3 0 0 supine -5 seated  Ankle dorsiflexion  WNL       PROM Left  eval  Hip flexion   Hip extension   Hip abduction   Hip adduction   Hip internal rotation   Hip external rotation   Knee flexion 117  Knee extension 0  Ankle dorsiflexion    (Blank rows = not tested)  LOWER EXTREMITY MMT:  MMT Right eval Left eval  Hip flexion    Hip extension 5 5  Hip abduction 5 5  Hip adduction 5 5  Hip internal rotation    Hip external rotation    Knee flexion 5 4+  Knee extension 5 4+  Ankle dorsiflexion 5 5   (Blank rows = not tested)   FUNCTIONAL TESTS:   GAIT: Distance walked: 200 ft Assistive device utilized: Walker - 2 wheeled Level of assistance: Modified independence Comments: using 50% WB on LLE; also ascended/descended curb and got into his truck independently using walker.    TODAY'S TREATMENT:  08/23/22 THERAPEUTIC EXERCISE: to improve flexibility, strength and mobility.  Verbal and tactile cues throughout for technique. Rec Bike - L2 x 6 min   08/15/22 THERAPEUTIC EXERCISE: to improve flexibility, strength and mobility.  Verbal and tactile cues throughout for technique. Rec Bike - L2 x 6 min Seated hip add ball squeeze + L/R LAQ with 3# 2 x 10 Seated GTB alt hip ABD/ER unilateral clam 2 x 10 Seated GTB L/R HS curls 2 x 10 Seated GTB + 3# ankle cuff wt hip flexion march 2 x 10 Supine L TKE  into small green ball 10 x 5" 2 sets Supine L SAQ over 8" FR with 3# ankle cuff wt 2 x 10 Supine L quad set + SLR 3# 2 x10 Supine B knee flexion HS curls with heels on peanut ball and 3# ankle weights x 20 Seated L hip hinge HS stretch 2 x 30"   08/12/22 THERAPEUTIC EXERCISE: to improve flexibility, strength and mobility.  Verbal and tactile cues throughout for technique.  NuStep - L4 x 6 min Supine L HS stretch with strap 2 x 60" Mod thomas L quad/hip flexor stretch 2 x 60" Supine L SAQ over 8" FR 2 x 10, 2nd set with 2# ankle cuff weight Supine L quad set + SLR 2#  2 x10 Prone L knee flexion 2#  2 x 10 Prone L hip extension 2#  2 x 10 Sidelying L hip adduction 2#  2 x 10  AAROM L knee flexion HS curls with strap and heels on peanut ball x 20  MANUAL THERAPY: To promote increased ROM. Gentle overpressure into L knee extension ROM    PATIENT EDUCATION:  Education details: Education on 50% WB using scale, Gait training for curbs/steps, HEP, POC discussed Person educated: Patient Education method: Explanation, Demonstration, Verbal cues, and Handouts Education comprehension: verbalized understanding and returned demonstration   HOME EXERCISE PROGRAM: Access Code: [email or web address] URL: [email or web address] Date: 08/03/2022 Prepared by: Almyra Free  Exercises - Supine Hamstring Stretch with Strap  - 2 x daily - 7 x weekly - 1 sets - 3 reps - 60 sec hold - Supine Quadriceps Stretch with Strap on Table  - 2 x daily - 7 x weekly - 1 sets - 3 reps - 30-60 sec hold - Supine Knee Extension Strengthening  - 1 x daily - 7 x weekly - 2 sets - 10 reps - Supine Active Straight Leg Raise (Mirrored)  - 2 x daily - 7 x weekly - 3 sets - 10 reps - Prone Knee Flexion AROM  - 1 x daily - 7 x weekly - 3 sets - 10 reps  ASSESSMENT:  CLINICAL IMPRESSION: Jeneen Rinks ***  OBJECTIVE IMPAIRMENTS Abnormal gait, decreased balance, decreased ROM, decreased strength, increased edema, impaired flexibility,  and pain.   ACTIVITY LIMITATIONS locomotion level  PARTICIPATION LIMITATIONS: occupation  PERSONAL FACTORS  N/A  are also affecting patient's functional outcome.   REHAB POTENTIAL: Excellent  CLINICAL DECISION MAKING: Stable/uncomplicated  EVALUATION COMPLEXITY: Low   GOALS: Goals reviewed with patient? Yes   SHORT TERM GOALS: Target date: 08/17/2022    Independent with initial HEP. Baseline:  Goal status: IN PROGRESS  2. Balance goal set after 08/17/22. Baseline:  Goal status: INITIAL    LONG TERM GOALS: Target date: 09/14/2022   Independent with advanced/ongoing HEP to improve outcomes and carryover.  Baseline:  Goal status: IN PROGRESS  2.  [name] will demonstrate left knee flexion to 120 deg to ascend/descend stairs. Baseline:  Goal status: PARTIALLY MET  08/15/22 - L knee flexion 129 but stairs not yet attempted reciprocally due to [place] restriction  3.  [name] will demonstrate full L knee extension for safety with gait. Baseline:  Goal status: PARTIALLY MET  08/15/22 - L knee extension 0 in supine but lacking 5-6 in seated LAQ  4.  [name] will be able to ambulate 600' safely without AD and normal gait pattern to access community.  Baseline:  Goal status: IN PROGRESS  5.  [name] will be able to ascend/descend stairs with 1 HR and reciprocal step pattern safely to access home and community.  Baseline:  Goal status: IN PROGRESS  6.  [name] will demonstrate >= 55 on LEFS to demonstrate improved mobility.  Baseline: 45 Goal status: IN PROGRESS   PLAN: PT FREQUENCY: 2x/week  PT DURATION: 8 weeks  PLANNED INTERVENTIONS: Therapeutic exercises, Therapeutic activity, Neuromuscular re-education, Balance training, Gait training, Patient/Family education, Self Care, Joint mobilization, Stair training, Aquatic Therapy, Dry Needling, Electrical stimulation, Cryotherapy, Moist heat, scar mobilization, Taping,  Vasopneumatic device, Ionotophoresis 28m/ml Dexamethasone, and Manual therapy  PLAN FOR NEXT SESSION: *** ROM/strength for L knee stability - review and progress HEP as indicated, gait as needed 50% WB on LLE until 08/17/22; balance after 08/17/22.   Kamari Bilek, PT 08/22/2022, 8:39 AM

## 2022-08-23 ENCOUNTER — Encounter: Payer: Self-pay | Admitting: Physical Therapy

## 2022-08-23 ENCOUNTER — Ambulatory Visit: Payer: Medicare HMO | Admitting: Physical Therapy

## 2022-08-23 DIAGNOSIS — M25662 Stiffness of left knee, not elsewhere classified: Secondary | ICD-10-CM

## 2022-08-23 DIAGNOSIS — M25562 Pain in left knee: Secondary | ICD-10-CM | POA: Diagnosis not present

## 2022-08-23 DIAGNOSIS — R2689 Other abnormalities of gait and mobility: Secondary | ICD-10-CM

## 2022-08-23 DIAGNOSIS — T84013D Broken internal left knee prosthesis, subsequent encounter: Secondary | ICD-10-CM | POA: Diagnosis not present

## 2022-08-26 ENCOUNTER — Encounter: Payer: Medicare HMO | Admitting: Physical Therapy

## 2022-08-30 ENCOUNTER — Ambulatory Visit: Payer: Medicare HMO | Attending: Orthopedic Surgery | Admitting: Physical Therapy

## 2022-08-30 ENCOUNTER — Encounter: Payer: Self-pay | Admitting: Physical Therapy

## 2022-08-30 DIAGNOSIS — H0520 Unspecified exophthalmos: Secondary | ICD-10-CM | POA: Diagnosis not present

## 2022-08-30 DIAGNOSIS — R2689 Other abnormalities of gait and mobility: Secondary | ICD-10-CM | POA: Insufficient documentation

## 2022-08-30 DIAGNOSIS — H16211 Exposure keratoconjunctivitis, right eye: Secondary | ICD-10-CM | POA: Diagnosis not present

## 2022-08-30 DIAGNOSIS — Z961 Presence of intraocular lens: Secondary | ICD-10-CM | POA: Diagnosis not present

## 2022-08-30 DIAGNOSIS — M25562 Pain in left knee: Secondary | ICD-10-CM | POA: Diagnosis not present

## 2022-08-30 DIAGNOSIS — H0102A Squamous blepharitis right eye, upper and lower eyelids: Secondary | ICD-10-CM | POA: Diagnosis not present

## 2022-08-30 DIAGNOSIS — M25662 Stiffness of left knee, not elsewhere classified: Secondary | ICD-10-CM | POA: Insufficient documentation

## 2022-08-30 DIAGNOSIS — H0102B Squamous blepharitis left eye, upper and lower eyelids: Secondary | ICD-10-CM | POA: Diagnosis not present

## 2022-08-30 DIAGNOSIS — E0500 Thyrotoxicosis with diffuse goiter without thyrotoxic crisis or storm: Secondary | ICD-10-CM | POA: Diagnosis not present

## 2022-08-30 DIAGNOSIS — H04123 Dry eye syndrome of bilateral lacrimal glands: Secondary | ICD-10-CM | POA: Diagnosis not present

## 2022-08-30 DIAGNOSIS — H532 Diplopia: Secondary | ICD-10-CM | POA: Diagnosis not present

## 2022-08-30 NOTE — Therapy (Signed)
OUTPATIENT PHYSICAL THERAPY TREATMENT   Patient Name: Ronald Bond MRN: [id number] DOB:September 04, 1946, 76 y.o., male Today's Date: 08/30/2022    PT End of Session - 08/30/22 1022     Visit Number 5    Number of Visits 16    Date for PT Re-Evaluation 09/14/22    Authorization Type [employer] MCR    Progress Note Due on Visit 13   MD PN on visit #3 - 08/15/22   PT Start Time 1022    PT Stop Time 1102    PT Time Calculation (min) 40 min    Activity Tolerance Patient tolerated treatment well    Behavior During Therapy [facility] for tasks assessed/performed                Past Medical History:  Diagnosis Date   GERD (gastroesophageal reflux disease)    HTN (hypertension)    Hyperlipidemia    Hypothyroidism    Osteoarthritis    Pre-diabetes    Wears glasses    Past Surgical History:  Procedure Laterality Date   COLONOSCOPY     EYE SURGERY  2006   both cataracts   MICROLARYNGOSCOPY Left 11/18/2013   Procedure: MICROLARYNGOSCOPY WITH REMOVAL OF GRANULOMA LEFT SIDE;  Surgeon: Izora Gala, MD;  Location: [place];  Service: ENT;  Laterality: Left;   NM MYOVIEW LTD  06/2007   Low risk, normal.  No evidence of ischemia or infarction   THYROIDECTOMY  1974   TONSILLECTOMY     as a child   TOTAL KNEE ARTHROPLASTY  2009   left   TOTAL KNEE REVISION Left 07/06/2022   Procedure: TOTAL KNEE REVISION;  Surgeon: Willaim Sheng, MD;  Location: [facility];  Service: Orthopedics;  Laterality: Left;   Patient Active Problem List   Diagnosis Date Noted   Intractable hiccoughs 07/08/2022   Failed total knee arthroplasty ([place]) 07/06/2022   PAC (premature atrial contraction) 12/27/2021   Encounter for general adult medical examination with abnormal findings 06/22/2021   Bradycardia 11/30/2020   Graves' orbitopathy 07/13/2020   Type II diabetes mellitus with manifestations ([place]) 05/13/2020   Graves disease 10/07/2019   Postablative hypothyroidism 10/07/2019   Thyroid-related  proptosis 08/15/2019   Chronic renal disease, stage 3, moderately decreased glomerular filtration rate (GFR) between 30-59 mL/min/1.73 square meter ([place]) 08/15/2019   Long-term current use of opiate analgesic 05/14/2018   Bilateral epiphora 11/16/2017   Thiamine deficiency 06/08/2017   Obesity (BMI 30.0-34.9) 06/04/2017   Hypertensive heart disease without CHF (congestive heart failure) 12/25/2016   Right cervical radiculopathy 06/16/2016   Iron deficiency anemia 03/08/2016   Spinal stenosis in cervical region 08/25/2015   Allergic eczema 08/07/2014   Obstructive sleep apnea 11/12/2013   Mild intermittent asthma 06/03/2013   Herniated lumbar disc without myelopathy 10/18/2012   Allergic rhinitis 03/10/2011   Cardiomegaly 10/18/2010   ERECTILE DYSFUNCTION, NON-ORGANIC, MILD 06/29/2009   Hyperlipidemia associated with type 2 diabetes mellitus ([place]) 05/28/2009   Essential hypertension, benign 05/28/2009   GERD 05/28/2009   BPH associated with nocturia 05/28/2009   Primary osteoarthritis of right knee 05/28/2009    PCP: Janith Lima, MD   REFERRING PROVIDER: Willaim Sheng, MD   REFERRING DIAG: [phone] (ICD-10-CM) - Status post revision of total knee replacement, bilateral  THERAPY DIAG:  Stiffness of left knee, not elsewhere classified  Acute pain of left knee  Other abnormalities of gait and mobility  RATIONALE FOR EVALUATION AND TREATMENT: Rehabilitation  ONSET DATE: 07/06/22  NEXT  MD VISIT: ~09/29/22   SUBJECTIVE:   SUBJECTIVE STATEMENT: Patient reports he has been doing well w/o the RW - occasionally catches himself limping but able to self-correct.  PAIN:  Are you having pain? No  PERTINENT HISTORY: HTN, OA, prediabetic. Left TKR 2008, Revision 2023  PRECAUTIONS: None  WEIGHT BEARING RESTRICTIONS Yes 50% WBing on L until 6 weeks post-op - 08/17/22  FALLS:  Has patient fallen in last 6 months? No  LIVING ENVIRONMENT: Lives with: lives with their  family Lives in: House/apartment Stairs: Yes: External: 4 steps; bilateral but cannot reach both Has following equipment at home: Single point cane and Walker - 2 wheeled  OCCUPATION: works for the [employer]  PLOF: [place] resume his normal activities, walks about a mile during his day at work   OBJECTIVE:   DIAGNOSTIC FINDINGS:  XR 07/06/22: Status post revision of total left knee arthroplasty without evidence of hardware failure.  PATIENT SURVEYS:  LEFS 45  COGNITION:  Overall cognitive status: Within functional limits for tasks assessed     SENSATION: WFL  EDEMA:  Circumferential: R knee  42 cm  L knee 46 cm  MUSCLE LENGTH: Mild tightness L HS  POSTURE: No Significant postural limitations  PALPATION: unremarkable  LOWER EXTREMITY ROM:   AROM Right eval Left  eval Left 08/12/22 Left 08/15/22  Hip flexion  WFL    Hip extension  WFL    Hip abduction  WFL    Hip adduction  WFL    Hip internal rotation      Hip external rotation  WFL    Knee flexion 117 110 128 129  Knee extension  -3 0 0 supine -5 seated  Ankle dorsiflexion  WNL       PROM Left  eval  Hip flexion   Hip extension   Hip abduction   Hip adduction   Hip internal rotation   Hip external rotation   Knee flexion 117  Knee extension 0  Ankle dorsiflexion    (Blank rows = not tested)  LOWER EXTREMITY MMT:  MMT Right eval Left eval  Hip flexion    Hip extension 5 5  Hip abduction 5 5  Hip adduction 5 5  Hip internal rotation    Hip external rotation    Knee flexion 5 4+  Knee extension 5 4+  Ankle dorsiflexion 5 5   (Blank rows = not tested)   FUNCTIONAL TESTS: 08/23/22 BERG - 52/56 5XSTS - 14.92 sec   GAIT: Distance walked: 200 ft Assistive device utilized: Walker - 2 wheeled Level of assistance: Modified independence Comments: using 50% WB on LLE; also ascended/descended curb and got into his truck independently using walker.    TODAY'S  TREATMENT:  08/30/22 THERAPEUTIC EXERCISE: to improve flexibility, strength and mobility.  Verbal and tactile cues throughout for technique.  NuStep - L6 x 6 min L lateral and fwd eccentric lowering/step-downs from 6" step 2 x 10 L blue TB TKE 20 x 5" Supported squat + blue TB TKE 2 x 10 L/R Fitter abduction (1 black/1 blue) 2 x 10, UE support on back of chair for balance L/R Fitter extension (1 black/1 blue) 2 x 10, UE support on back of chair for balance  GAIT TRAINING: To normalize gait pattern. Stairs:  Level of Assistance: Modified independence  Stair Negotiation Technique: Alternating Pattern  with Single Rail on Right on descent only  Number of Stairs: 14   Height of Stairs: 7  Comments: limited eccentric quad control on L   08/23/22 THERAPEUTIC EXERCISE: to improve flexibility, strength and mobility.  Verbal and tactile cues throughout for technique. Rec Bike - L3 x 6 min Step Ups/downs with one UE support 6 inch x 10 ea Side step up 6 inch L x 10 Heel raises x 10 Standing hip ABD, ext, flex GTB x 10 ea bil  Gait: 200 ft, no AD  - worked on normalizing gait, decreasing trunk lean. Used mirror for visual cues.  Neuro Reed: BERG completed 5xSTS completed   08/15/22 THERAPEUTIC EXERCISE: to improve flexibility, strength and mobility.  Verbal and tactile cues throughout for technique. Rec Bike - L2 x 6 min Seated hip add ball squeeze + L/R LAQ with 3# 2 x 10 Seated GTB alt hip ABD/ER unilateral clam 2 x 10 Seated GTB L/R HS curls 2 x 10 Seated GTB + 3# ankle cuff wt hip flexion march 2 x 10 Supine L TKE into small green ball 10 x 5" 2 sets Supine L SAQ over 8" FR with 3# ankle cuff wt 2 x 10 Supine L quad set + SLR 3# 2 x10 Supine B knee flexion HS curls with heels on peanut ball and 3# ankle weights x 20 Seated L hip hinge HS stretch 2 x 30"   PATIENT EDUCATION:  Education details: HEP progressed 08/29/22 Person educated: Patient Education method: Explanation,  Demonstration, Verbal cues, and Handouts Education comprehension: verbalized understanding and returned demonstration   HOME EXERCISE PROGRAM: Access Code: [email or web address] URL: [email or web address] Date: 08/30/2022 Prepared by: Annie Paras  Exercises - Supine Hamstring Stretch with Strap  - 2 x daily - 7 x weekly - 1 sets - 3 reps - 60 sec hold - Supine Quadriceps Stretch with Strap on Table  - 2 x daily - 7 x weekly - 1 sets - 3 reps - 30-60 sec hold - Supine Knee Extension Strengthening  - 1 x daily - 7 x weekly - 2 sets - 10 reps - Supine Active Straight Leg Raise (Mirrored)  - 2 x daily - 7 x weekly - 3 sets - 10 reps - Prone Knee Flexion AROM  - 1 x daily - 7 x weekly - 3 sets - 10 reps - Sit to Stand  - 3 x daily - 7 x weekly - 1 sets - 10 reps - Forward Step Up  - 1 x daily - 7 x weekly - 2 sets - 10 reps - Forward Step Down Touch with Toe  - 1 x daily - 7 x weekly - 2 sets - 10 reps - Sideways Step Touch  - 1 x daily - 7 x weekly - 2 sets - 10 reps - Standing Hip Flexion with Resistance Loop  - 1 x daily - 3 x weekly - 3 sets - 10 reps - Standing Hip Abduction with Resistance at Ankles and Counter Support  - 1 x daily - 3 x weekly - 3 sets - 10 reps - Standing Hip Extension with Resistance at Ankles and Counter Support  - 1 x daily - 3 x weekly - 3 sets - 10 reps - Standing Terminal Knee Extension with Resistance  - 1 x daily - 3 x weekly - 2 sets - 10 reps - 5 sec hold - Side Step Down with Counter Support  - 1 x daily - 3 x weekly - 2 sets - 10 reps - 3 sec hold - Forward Step Down with Heel Tap and Counter Support  -  1 x daily - 3 x weekly - 2 sets - 10 reps - 3 sec hold   ASSESSMENT:  CLINICAL IMPRESSION: Micahel remains pain free today. He reports walking going well w/o AD but does have to occasionally self-correct for limp. He reports some difficulty on stair descent when lowering with L LE - decreased eccentric quad control noted. Strengthening targeting quad  control, especially eccentric control, as well as proximal LE stability to allow for increased ease of stair negotiation. Cues necessary to slow pace and focus on positioning/alignment to avoid excessive forward motion of knees over toes with squats and step downs as well as to avoid accessory hip and trunk motion with TKEs. HEP updated with quad focused exercises at pt request.  OBJECTIVE IMPAIRMENTS Abnormal gait, decreased balance, decreased ROM, decreased strength, increased edema, impaired flexibility, and pain.   ACTIVITY LIMITATIONS locomotion level  PARTICIPATION LIMITATIONS: occupation  PERSONAL FACTORS  N/A  are also affecting patient's functional outcome.   REHAB POTENTIAL: Excellent  CLINICAL DECISION MAKING: Stable/uncomplicated  EVALUATION COMPLEXITY: Low   GOALS: Goals reviewed with patient? Yes   SHORT TERM GOALS: Target date: 08/17/2022    Independent with initial HEP. Baseline:  Goal status: IN PROGRESS  2. Balance goal set after 08/17/22. Baseline:  Goal status: MET    LONG TERM GOALS: Target date: 09/14/2022   Independent with advanced/ongoing HEP to improve outcomes and carryover.  Baseline:  Goal status: IN PROGRESS  2.  [name] will demonstrate left knee flexion to 120 deg to ascend/descend stairs. Baseline:  Goal status: PARTIALLY MET  08/15/22 - L knee flexion 129 but stairs not yet attempted reciprocally due to [place] restriction  3.  [name] will demonstrate full L knee extension for safety with gait. Baseline:  Goal status: PARTIALLY MET  08/15/22 - L knee extension 0 in supine but lacking 5-6 in seated LAQ  4.  [name] will be able to ambulate 600' safely without AD and normal gait pattern to access community.  Baseline:  Goal status: IN PROGRESS  5.  [name] will be able to ascend/descend stairs with 1 HR and reciprocal step pattern safely to access home and community.  Baseline:  Goal status: IN  PROGRESS  6.  [name] will demonstrate >= 55 on LEFS to demonstrate improved mobility.  Baseline: 45 Goal status: IN PROGRESS  7.  [name] will demonstrate improved balance by being able to perform SLS > 7 sec bil. Baseline: 45 Goal status: IN PROGRESS   PLAN: PT FREQUENCY: 2x/week  PT DURATION: 8 weeks  PLANNED INTERVENTIONS: Therapeutic exercises, Therapeutic activity, Neuromuscular re-education, Balance training, Gait training, Patient/Family education, Self Care, Joint mobilization, Stair training, Aquatic Therapy, Dry Needling, Electrical stimulation, Cryotherapy, Moist heat, scar mobilization, Taping, Vasopneumatic device, Ionotophoresis 44m/ml Dexamethasone, and Manual therapy  PLAN FOR NEXT SESSION:  ROM/strength for L knee stability and functional activities including stairs.  Pt at FWB status. Work on higher level balance.   JPercival Spanish PT 08/30/2022, 11:22 AM

## 2022-08-31 ENCOUNTER — Telehealth: Payer: Self-pay | Admitting: Internal Medicine

## 2022-08-31 MED ORDER — LEVOTHYROXINE SODIUM 112 MCG PO TABS: 112.0000 ug | ORAL_TABLET | Freq: Every day | ORAL | 0 refills | Status: DC

## 2022-08-31 NOTE — Telephone Encounter (Signed)
Patient has scheduled an appointment for 12/01/2022 and is requesting that the refill request  for Levothyroxine be sent to [facility] on [address] in [place].

## 2022-09-01 ENCOUNTER — Other Ambulatory Visit: Payer: Self-pay | Admitting: Internal Medicine

## 2022-09-01 DIAGNOSIS — I119 Hypertensive heart disease without heart failure: Secondary | ICD-10-CM

## 2022-09-01 DIAGNOSIS — I10 Essential (primary) hypertension: Secondary | ICD-10-CM

## 2022-09-04 NOTE — Progress Notes (Unsigned)
Cardiology Clinic Note   Patient Name: Ronald Bond Date of Encounter: 09/04/2022  Primary Care Provider:  Janith Lima, MD Primary Cardiologist:  Glenetta Hew, MD  Patient Profile    Ronald Bond 76 year old male presents to the clinic today for follow-up evaluation of his essential hypertension and PACs.  Past Medical History    Past Medical History:  Diagnosis Date   GERD (gastroesophageal reflux disease)    HTN (hypertension)    Hyperlipidemia    Hypothyroidism    Osteoarthritis    Pre-diabetes    Wears glasses    Past Surgical History:  Procedure Laterality Date   COLONOSCOPY     EYE SURGERY  2006   both cataracts   MICROLARYNGOSCOPY Left 11/18/2013   Procedure: MICROLARYNGOSCOPY WITH REMOVAL OF GRANULOMA LEFT SIDE;  Surgeon: Izora Gala, MD;  Location: [facility];  Service: ENT;  Laterality: Left;   NM MYOVIEW LTD  06/2007   Low risk, normal.  No evidence of ischemia or infarction   THYROIDECTOMY  1974   TONSILLECTOMY     as a child   TOTAL KNEE ARTHROPLASTY  2009   left   TOTAL KNEE REVISION Left 07/06/2022   Procedure: TOTAL KNEE REVISION;  Surgeon: Willaim Sheng, MD;  Location: [facility];  Service: Orthopedics;  Laterality: Left;    Allergies  Allergies  Allergen Reactions   Lisinopril Other (See Comments)    Edema in face    History of Present Illness    Ronald Bond has a PMH of HTN, PACs, allergic rhinitis, intermittent asthma, GERD, OSA, Graves' disease, type 2 diabetes, Graves' orbitopathy, chronic renal failure stage III, iron deficiency anemia, obesity, and bradycardia.  He was seen on 09/17/2021 by Bunnie Domino, DNP for evaluation of chest discomfort.  He has been advised to go to the emergency department 1014 for chest pain but did not go.  He had recently increased his carvedilol from 6.25 mg twice daily in conjunction with amlodipine and losartan.  His PCP discontinued carvedilol due to bradycardia.   He was noted to have sinus bradycardia despite having stopped carvedilol.  A cardiac event monitor was ordered.  He was able to climb stairs and walk around.  He was at the [facility] and was able to ambulate without difficulty shortness of breath and dizziness.  His BP was well controlled.  His cardiac event monitor showed predominantly sinus rhythm with a heart rate of 36-137 on average of 66 bpm.  He was noted to have 28 episodes of PAT and atrial runs, occasional PACs and isolated rare PVCs.  He was seen in follow-up by Dr. Ellyn Hack on 12/27/2021.  He felt much better since stopping his beta-blocker.  His energy level had improved.  He was very active at his job.  He did note some shortness of breath when going upstairs but did not notice any dyspnea with normal activities.  He denies chest pain or pressure.  He denied significant palpitations.  He denied lightheadedness or dizziness.  He did note occasional episodes of squeezing this pinpoint pressure in the area of his right pec muscle.  This was made worse with specific movements.  He presents to the clinic today for follow-up evaluation and states***  *** denies chest pain, shortness of breath, lower extremity edema, fatigue, palpitations, melena, hematuria, hemoptysis, diaphoresis, weakness, presyncope, syncope, orthopnea, and PND.  Hypertension-BP today***.  Well-controlled at home.  Previously did not tolerate carvedilol due to bradycardia.  Also noted fatigue.  Cardiac event monitor noted occasional PACs and isolated rare PVCs with 20 episodes of PAT. Continue current medical therapy Heart healthy low-sodium diet-salty 6 given Increase physical activity as tolerated  Hyperlipidemia-LDL*** Continue atorvastatin, Farxiga Heart healthy low-sodium high-fiber diet Maintain physical activity  PACs-denies increased palpitations.  Does notice occasional rare palpitations that are not bothersome.  Was noted to have 4.9% PVCs on his previous  cardiac event monitor. Maintain p.o. hydration, avoid triggers.  OSA-reports compliance with CPAP.  Waking up well rested. Continue CPAP use. Continue weight loss  Obesity-weight today***.  Working on increasing physical activity. Continue weight loss Calorie restricted diet Continue increase physical activity  Bradycardia-previously noted to have bradycardia and fatigue with beta blocking agents.  Avoid beta-blocker/calcium channel blocker AV nodal agents.  Heart rate today***  Disposition: Follow-up with Dr. Ellyn Hack on the 9-12 months. Home Medications    Prior to Admission medications   Medication Sig Start Date End Date Taking? Authorizing Provider  albuterol (VENTOLIN HFA) 108 (90 Base) MCG/ACT inhaler Inhale 2 puffs into the lungs every 6 (six) hours as needed for wheezing or shortness of breath. 07/05/21   Janith Lima, MD  amLODipine (NORVASC) 10 MG tablet Take 1 tablet (10 mg total) by mouth daily. 06/06/22   Janith Lima, MD  apixaban (ELIQUIS) 2.5 MG TABS tablet Take 1 tablet (2.5 mg total) by mouth 2 (two) times daily. 07/08/22   Willaim Sheng, MD  atorvastatin (LIPITOR) 40 MG tablet TAKE 1 TABLET BY MOUTH EVERY DAY 07/02/22   Janith Lima, MD  Blood Pressure Monitoring (BLOOD PRESSURE MONITOR/ARM) DEVI 1 Device by Does not apply route daily. USE DAILY TO  TAKE BLOOD PRESSURE READINGS 12/27/21   Leonie Man, MD  cetirizine (ZYRTEC) 10 MG tablet Take 10 mg by mouth daily.    [provider]  chlorproMAZINE (THORAZINE) 25 MG tablet Take 1 tablet (25 mg total) by mouth 4 (four) times daily as needed. 07/08/22   Janith Lima, MD  cholecalciferol (VITAMIN D3) 25 MCG (1000 UNIT) tablet Take 1,000 Units by mouth daily.    [provider]  docusate sodium (COLACE) 100 MG capsule Take 100 mg by mouth 2 (two) times daily.    [provider]  FARXIGA 10 MG TABS tablet TAKE 1 TABLET BY MOUTH EVERY DAY BEFORE BREAKFAST 06/09/22   Janith Lima,  MD  fluticasone (FLONASE) 50 MCG/ACT nasal spray Place 2 sprays into both nostrils daily. Patient taking differently: Place 2 sprays into both nostrils daily as needed. 10/15/18   Janith Lima, MD  hydrALAZINE (APRESOLINE) 25 MG tablet TAKE 1 TABLET BY MOUTH THREE TIMES A DAY 09/01/22   Janith Lima, MD  indapamide (LOZOL) 1.25 MG tablet TAKE 1 TABLET BY MOUTH EVERY DAY 06/26/22   Janith Lima, MD  KLOR-CON M20 20 MEQ tablet TAKE 1 TABLET BY MOUTH TWICE A DAY 08/12/22   Janith Lima, MD  levothyroxine (SYNTHROID) 112 MCG tablet Take 1 tablet (112 mcg total) by mouth daily. 08/31/22   Shamleffer, Melanie Crazier, MD  meloxicam (MOBIC) 7.5 MG tablet TAKE 1 TABLET BY MOUTH EVERY DAY 08/20/22   Janith Lima, MD  metoprolol tartrate (LOPRESSOR) 25 MG tablet Take 0.5 tablets (12.5 mg total) by mouth 2 (two) times daily. 12/29/21 07/06/22  Lendon Colonel, NP  Multiple Vitamin (MULTIVITAMIN) tablet Take 1 tablet by mouth daily.    [provider]  Multiple Vitamins-Minerals (ZINC PO) Take  1 tablet by mouth daily.    [provider]  omeprazole (PRILOSEC) 40 MG capsule TAKE 1 CAPSULE (40 MG TOTAL) BY MOUTH DAILY. 07/02/22   Janith Lima, MD  Polyethyl Glycol-Propyl Glycol 0.4-0.3 % SOLN Place 1 drop into both eyes as needed (dry eyes).    [provider]  tamsulosin (FLOMAX) 0.4 MG CAPS capsule TAKE 1 CAPSULE BY MOUTH EVERY DAY AFTER SUPPER 06/25/22   Janith Lima, MD  TURMERIC CURCUMIN PO Take 1 tablet by mouth daily.    [provider]  valsartan (DIOVAN) 320 MG tablet Take 1 tablet (320 mg total) by mouth daily. 12/27/21   Leonie Man, MD  vitamin C (ASCORBIC ACID) 500 MG tablet Take 500 mg by mouth daily.    [provider]    Family History    Family History  Problem Relation Age of Onset   Brain cancer Mother    Heart disease Father    Heart disease Brother    He indicated that his mother is deceased. He indicated that his father  is deceased. He indicated that his brother is deceased. He indicated that his maternal grandmother is deceased. He indicated that his maternal grandfather is deceased. He indicated that his paternal grandmother is deceased. He indicated that his paternal grandfather is deceased.  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  He states that he enjoys  the job a lot does a lot of walking and climbing stairs without significant discomfort.      He usually works ~2 weeks @ a time - stays in [employer] (paid for by [employer]) - & comes home for ~4 d weekends.   Social Determinants of Health   Financial Resource Strain: Low Risk  (12/24/2021)   Overall Financial Resource Strain (CARDIA)    Difficulty of Paying Living Expenses: Not hard at all  Food Insecurity: No Food Insecurity (12/24/2021)   Hunger Vital Sign    Worried About Running Out of Food in the Last Year: Never true    Ran Out of Food in the Last Year: Never true  Transportation Needs: No Transportation Needs (12/24/2021)   PRAPARE - Hydrologist (Medical): No    Lack of Transportation (Non-Medical): No  Physical Activity: Sufficiently Active (12/24/2021)   Exercise Vital Sign    Days of Exercise per Week:  7 days    Minutes of Exercise per Session: 30 min  Stress: No Stress Concern Present (12/24/2021)   [place]    Feeling of Stress : Not  at all  Social Connections: Socially Integrated (12/24/2021)   Social Connection and Isolation Panel [NHANES]    Frequency of Communication with Friends and Family: More than three times a week    Frequency of Social Gatherings with Friends and Family: Not on file    Attends Religious Services: More than 4 times per year    Active Member of [employer] or Organizations: Yes    Attends Archivist Meetings: More than 4 times per year    Marital Status: Married  Human resources officer Violence: Not At Risk (12/24/2021)   Humiliation, Afraid, Rape, and Kick questionnaire    Fear of Current or Ex-Partner: No    Emotionally Abused: No    Physically Abused: No    Sexually Abused: No     Review of Systems    General:  No chills, fever, night sweats or weight changes.  Cardiovascular:  No chest pain, dyspnea on exertion, edema, orthopnea, palpitations, paroxysmal nocturnal dyspnea. Dermatological: No rash, lesions/masses Respiratory: No cough, dyspnea Urologic: No hematuria, dysuria Abdominal:   No nausea, vomiting, diarrhea, bright red blood per rectum, melena, or hematemesis Neurologic:  No visual changes, wkns, changes in mental status. All other systems reviewed and are otherwise negative except as noted above.  Physical Exam    VS:  There were no vitals taken for this visit. , BMI There is no height or weight on file to calculate BMI. GEN: Well nourished, well developed, in no acute distress. HEENT: normal. Neck: Supple, no JVD, carotid bruits, or masses. Cardiac: RRR, no murmurs, rubs, or gallops. No clubbing, cyanosis, edema.  Radials/DP/PT 2+ and equal bilaterally.  Respiratory:  Respirations regular and unlabored, clear to auscultation bilaterally. GI: Soft, nontender,  nondistended, BS + x 4. MS: no deformity or atrophy. Skin: warm and dry, no rash. Neuro:  Strength and sensation are intact. Psych: Normal affect.  Accessory Clinical Findings    Recent Labs: 06/06/2022: ALT 17; TSH 0.83 07/07/2022: BUN 34; Creatinine, Ser 1.87; Potassium 3.9; Sodium 135 07/08/2022: Hemoglobin 10.3; Platelets 175   Recent Lipid Panel    Component Value Date/Time   CHOL 159 06/06/2022 1051   CHOL 284 (H) 04/17/2015 1230   TRIG 90.0 06/06/2022 1051   TRIG 166 (H) 04/17/2015 1230   HDL 56.60 06/06/2022 1051   HDL 58 04/17/2015 1230   CHOLHDL 3 06/06/2022 1051   VLDL 18.0 06/06/2022 1051   LDLCALC 84 06/06/2022 1051   LDLCALC 193 (H) 04/17/2015 1230   LDLDIRECT 75.0 03/08/2016 1624    No BP recorded.  {Refresh Note OR Click here to enter BP  :1}***    ECG personally reviewed by me today- *** - No acute changes  Cardiac event monitor 10/07/2021 7-day Predominantly sinus rhythm.:Heart rate range 36-137 bpm. Average 66 bpm. 28 episodes of PAT/atrial runs: Fastest 6 beats-102 bpm, longest 13.1 seconds 126 beats. Occasional PACs (4.9%) with rare <1%) couplets and triplets. Rare isolated PVCs <1%) No sustained arrhythmias-tachycardic or bradycardic)   No significant findings.     Glenetta Hew, MD  Assessment & Plan   1.  ***   Jossie Ng. Morry Veiga NP-C     09/04/2022, 3:50 PM [facility] [address] Office [phone] Fax [phone]  Notice: This dictation was prepared with Dragon dictation along with smaller phrase technology. Any transcriptional errors that result from this process are unintentional and may not be corrected upon review.  I spent***minutes examining this patient, reviewing medications, and using patient centered  shared decision making involving her cardiac care.  Prior to her visit I spent greater than 20 minutes reviewing her past medical history,  medications, and prior cardiac tests.

## 2022-09-06 ENCOUNTER — Encounter: Payer: Self-pay | Admitting: General Practice

## 2022-09-06 ENCOUNTER — Ambulatory Visit: Payer: Medicare HMO | Admitting: Physical Therapy

## 2022-09-06 ENCOUNTER — Ambulatory Visit: Payer: Medicare HMO | Attending: General Practice | Admitting: General Practice

## 2022-09-06 ENCOUNTER — Encounter: Payer: Self-pay | Admitting: Physical Therapy

## 2022-09-06 VITALS — BP 136/62 | HR 82 | Ht 70.0 in | Wt 209.0 lb

## 2022-09-06 DIAGNOSIS — M25662 Stiffness of left knee, not elsewhere classified: Secondary | ICD-10-CM | POA: Diagnosis not present

## 2022-09-06 DIAGNOSIS — R001 Bradycardia, unspecified: Secondary | ICD-10-CM | POA: Diagnosis not present

## 2022-09-06 DIAGNOSIS — E1169 Type 2 diabetes mellitus with other specified complication: Secondary | ICD-10-CM | POA: Diagnosis not present

## 2022-09-06 DIAGNOSIS — M25562 Pain in left knee: Secondary | ICD-10-CM | POA: Diagnosis not present

## 2022-09-06 DIAGNOSIS — I119 Hypertensive heart disease without heart failure: Secondary | ICD-10-CM

## 2022-09-06 DIAGNOSIS — E785 Hyperlipidemia, unspecified: Secondary | ICD-10-CM | POA: Diagnosis not present

## 2022-09-06 DIAGNOSIS — I491 Atrial premature depolarization: Secondary | ICD-10-CM

## 2022-09-06 DIAGNOSIS — G4733 Obstructive sleep apnea (adult) (pediatric): Secondary | ICD-10-CM | POA: Diagnosis not present

## 2022-09-06 DIAGNOSIS — R2689 Other abnormalities of gait and mobility: Secondary | ICD-10-CM

## 2022-09-06 NOTE — Patient Instructions (Signed)
Medication Instructions:  The current medical regimen is effective;  continue present plan and medications as directed. Please refer to the Current Medication list given to you today.  *If you need a refill on your cardiac medications before your next appointment, please call your pharmacy*  Lab Work: NONE  If you have any lab test that is abnormal or we need to change your treatment, we will call you to review the results.  Testing/Procedures: NONE  Other Instructions BRING YOUR BLOOD PRESSURE CUFF TO YOUR FOLLOW UP APPOINTMENT  PLEASE READ AND FOLLOW ATTACHED  SALTY 6   PLEASE INCREASE PHYSICAL ACTIVITY AS TOLERATED  Follow-Up: At [facility], you and your health needs are our priority.  As part of our continuing mission to provide you with exceptional heart care, we have created designated Provider Care Teams.  These Care Teams include your primary Cardiologist (physician) and Advanced Practice Providers (APPs -  Physician Assistants and Nurse Practitioners) who all work together to provide you with the care you need, when you need it.  Your next appointment:   9-12 month(s)  The format for your next appointment:   In Person  Provider:   Glenetta Hew, MD    Important Information About Sugar

## 2022-09-06 NOTE — Therapy (Signed)
OUTPATIENT PHYSICAL THERAPY TREATMENT   Patient Name: Ronald Bond MRN: [id number] DOB:1946-04-27, 76 y.o., male Today's Date: 09/06/2022    PT End of Session - 09/06/22 0932     Visit Number 6    Number of Visits 16    Date for PT Re-Evaluation 09/14/22    Authorization Type [employer] MCR    Progress Note Due on Visit 13   MD PN on visit #3 - 08/15/22   PT Start Time 0932    PT Stop Time 0957   Pt requesting to leave early due to cardiology appt   PT Time Calculation (min) 25 min    Activity Tolerance Patient tolerated treatment well    Behavior During Therapy [facility] for tasks assessed/performed                 Past Medical History:  Diagnosis Date   GERD (gastroesophageal reflux disease)    HTN (hypertension)    Hyperlipidemia    Hypothyroidism    Osteoarthritis    Pre-diabetes    Wears glasses    Past Surgical History:  Procedure Laterality Date   COLONOSCOPY     EYE SURGERY  2006   both cataracts   MICROLARYNGOSCOPY Left 11/18/2013   Procedure: MICROLARYNGOSCOPY WITH REMOVAL OF GRANULOMA LEFT SIDE;  Surgeon: Izora Gala, MD;  Location: [facility];  Service: ENT;  Laterality: Left;   NM MYOVIEW LTD  06/2007   Low risk, normal.  No evidence of ischemia or infarction   THYROIDECTOMY  1974   TONSILLECTOMY     as a child   TOTAL KNEE ARTHROPLASTY  2009   left   TOTAL KNEE REVISION Left 07/06/2022   Procedure: TOTAL KNEE REVISION;  Surgeon: Willaim Sheng, MD;  Location: [facility];  Service: Orthopedics;  Laterality: Left;   Patient Active Problem List   Diagnosis Date Noted   Intractable hiccoughs 07/08/2022   Failed total knee arthroplasty ([place]) 07/06/2022   PAC (premature atrial contraction) 12/27/2021   Encounter for general adult medical examination with abnormal findings 06/22/2021   Bradycardia 11/30/2020   Graves' orbitopathy 07/13/2020   Type II diabetes mellitus with manifestations ([place]) 05/13/2020   Graves disease 10/07/2019    Postablative hypothyroidism 10/07/2019   Thyroid-related proptosis 08/15/2019   Chronic renal disease, stage 3, moderately decreased glomerular filtration rate (GFR) between 30-59 mL/min/1.73 square meter ([place]) 08/15/2019   Long-term current use of opiate analgesic 05/14/2018   Bilateral epiphora 11/16/2017   Thiamine deficiency 06/08/2017   Obesity (BMI 30.0-34.9) 06/04/2017   Hypertensive heart disease without CHF (congestive heart failure) 12/25/2016   Right cervical radiculopathy 06/16/2016   Iron deficiency anemia 03/08/2016   Spinal stenosis in cervical region 08/25/2015   Allergic eczema 08/07/2014   Obstructive sleep apnea 11/12/2013   Mild intermittent asthma 06/03/2013   Herniated lumbar disc without myelopathy 10/18/2012   Allergic rhinitis 03/10/2011   Cardiomegaly 10/18/2010   ERECTILE DYSFUNCTION, NON-ORGANIC, MILD 06/29/2009   Hyperlipidemia associated with type 2 diabetes mellitus ([place]) 05/28/2009   Essential hypertension, benign 05/28/2009   GERD 05/28/2009   BPH associated with nocturia 05/28/2009   Primary osteoarthritis of right knee 05/28/2009    PCP: Janith Lima, MD   REFERRING PROVIDER: Willaim Sheng, MD   REFERRING DIAG: [phone] (ICD-10-CM) - Status post revision of total knee replacement, bilateral  THERAPY DIAG:  Stiffness of left knee, not elsewhere classified  Acute pain of left knee  Other abnormalities of gait and mobility  RATIONALE  FOR EVALUATION AND TREATMENT: Rehabilitation  ONSET DATE: 07/06/22  NEXT MD VISIT: ~09/29/22   SUBJECTIVE:   SUBJECTIVE STATEMENT: Pt requesting to cut visit to 30 min d/t needing to go to cardiology visit.  PAIN:  Are you having pain? No  PERTINENT HISTORY: HTN, OA, prediabetic. Left TKR 2008, Revision 2023  PRECAUTIONS: None  WEIGHT BEARING RESTRICTIONS Yes 50% WBing on L until 6 weeks post-op - 08/17/22  FALLS:  Has patient fallen in last 6 months? No  LIVING ENVIRONMENT: Lives with:  lives with their family Lives in: House/apartment Stairs: Yes: External: 4 steps; bilateral but cannot reach both Has following equipment at home: Single point cane and Walker - 2 wheeled  OCCUPATION: works for the [employer] system  PLOF: [place] resume his normal activities, walks about a mile during his day at work   OBJECTIVE:   DIAGNOSTIC FINDINGS:  XR 07/06/22: Status post revision of total left knee arthroplasty without evidence of hardware failure.  PATIENT SURVEYS:  LEFS 45  COGNITION:  Overall cognitive status: Within functional limits for tasks assessed     SENSATION: WFL  EDEMA:  Circumferential: R knee  42 cm  L knee 46 cm  MUSCLE LENGTH: Mild tightness L HS  POSTURE: No Significant postural limitations  PALPATION: unremarkable  LOWER EXTREMITY ROM:   AROM Right eval Left  eval Left 08/12/22 Left 08/15/22 Left 09/09/22  Hip flexion  WFL     Hip extension  WFL     Hip abduction  WFL     Hip adduction  WFL     Hip internal rotation       Hip external rotation  WFL     Knee flexion 117 110 128 129   Knee extension  -3 0 0 supine -5 seated   Ankle dorsiflexion  WNL        PROM Left  eval  Hip flexion   Hip extension   Hip abduction   Hip adduction   Hip internal rotation   Hip external rotation   Knee flexion 117  Knee extension 0  Ankle dorsiflexion    (Blank rows = not tested)  LOWER EXTREMITY MMT:  MMT Right eval Left eval  Hip flexion    Hip extension 5 5  Hip abduction 5 5  Hip adduction 5 5  Hip internal rotation    Hip external rotation    Knee flexion 5 4+  Knee extension 5 4+  Ankle dorsiflexion 5 5   (Blank rows = not tested)   FUNCTIONAL TESTS: 08/23/22 BERG - 52/56 5XSTS - 14.92 sec   GAIT: Distance walked: 200 ft Assistive device utilized: Walker - 2 wheeled Level of assistance: Modified independence Comments: using 50% WB on LLE; also ascended/descended curb and got into his truck  independently using walker.    TODAY'S TREATMENT:  09/06/22 THERAPEUTIC EXERCISE: to improve flexibility, strength and mobility.  Verbal and tactile cues throughout for technique.  NuStep - L7 x 5 min L lateral and fwd eccentric lowering R heel to floor from 8" step 2 x 10 L SLS + 5-way tap to colored dots 2 x 5 TRX squat 2 x 10 - clicking/popping in L knee, but no pain TRX L/R lunge x 10 each - increased pain in R knee with R foot fwd   08/30/22 THERAPEUTIC EXERCISE: to improve flexibility, strength and mobility.  Verbal and tactile cues throughout for technique.  NuStep - L6 x 6 min L lateral and  fwd eccentric lowering/step-downs from 6" step 2 x 10 L blue TB TKE 20 x 5" Supported squat + blue TB TKE 2 x 10 L/R Fitter abduction (1 black/1 blue) 2 x 10, UE support on back of chair for balance L/R Fitter extension (1 black/1 blue) 2 x 10, UE support on back of chair for balance  GAIT TRAINING: To normalize gait pattern. Stairs:  Level of Assistance: Modified independence  Stair Negotiation Technique: Alternating Pattern  with Single Rail on Right on descent only  Number of Stairs: 14   Height of Stairs: 7  Comments: limited eccentric quad control on L   08/23/22 THERAPEUTIC EXERCISE: to improve flexibility, strength and mobility.  Verbal and tactile cues throughout for technique. Rec Bike - L3 x 6 min Step Ups/downs with one UE support 6 inch x 10 ea Side step up 6 inch L x 10 Heel raises x 10 Standing hip ABD, ext, flex GTB x 10 ea bil  Gait: 200 ft, no AD  - worked on normalizing gait, decreasing trunk lean. Used mirror for visual cues.  Neuro Reed: BERG completed 5xSTS completed   PATIENT EDUCATION:  Education details: HEP progressed 08/29/22 Person educated: Patient Education method: Explanation, Demonstration, Verbal cues, and Handouts Education comprehension: verbalized understanding and returned demonstration   HOME EXERCISE PROGRAM: Access Code:  [email or web address] URL: [email or web address] Date: 08/30/2022 Prepared by: Annie Paras  Exercises - Supine Hamstring Stretch with Strap  - 2 x daily - 7 x weekly - 1 sets - 3 reps - 60 sec hold - Supine Quadriceps Stretch with Strap on Table  - 2 x daily - 7 x weekly - 1 sets - 3 reps - 30-60 sec hold - Supine Knee Extension Strengthening  - 1 x daily - 7 x weekly - 2 sets - 10 reps - Supine Active Straight Leg Raise (Mirrored)  - 2 x daily - 7 x weekly - 3 sets - 10 reps - Prone Knee Flexion AROM  - 1 x daily - 7 x weekly - 3 sets - 10 reps - Sit to Stand  - 3 x daily - 7 x weekly - 1 sets - 10 reps - Forward Step Up  - 1 x daily - 7 x weekly - 2 sets - 10 reps - Forward Step Down Touch with Toe  - 1 x daily - 7 x weekly - 2 sets - 10 reps - Sideways Step Touch  - 1 x daily - 7 x weekly - 2 sets - 10 reps - Standing Hip Flexion with Resistance Loop  - 1 x daily - 3 x weekly - 3 sets - 10 reps - Standing Hip Abduction with Resistance at Ankles and Counter Support  - 1 x daily - 3 x weekly - 3 sets - 10 reps - Standing Hip Extension with Resistance at Ankles and Counter Support  - 1 x daily - 3 x weekly - 3 sets - 10 reps - Standing Terminal Knee Extension with Resistance  - 1 x daily - 3 x weekly - 2 sets - 10 reps - 5 sec hold - Side Step Down with Counter Support  - 1 x daily - 3 x weekly - 2 sets - 10 reps - 3 sec hold - Forward Step Down with Heel Tap and Counter Support  - 1 x daily - 3 x weekly - 2 sets - 10 reps - 3 sec hold   ASSESSMENT:  CLINICAL IMPRESSION: [place] continues to note difficulty going  doing stairs at an even pace, therefore exercises focusing on eccentric quad control and SLS stability. Pt able to complete eccentric step-downs from higher step but increased fatigue noted. Some popping audible in L knee during TRX squat and once during L lunge but pt denies pain with this. Session cut short at pt request due to needing to get to another appointment. [place] will  continue to benefit from skilled PT to improve functional LE strength and balance for safe mobility, ambulation and stair negotiation.  OBJECTIVE IMPAIRMENTS Abnormal gait, decreased balance, decreased ROM, decreased strength, increased edema, impaired flexibility, and pain.   ACTIVITY LIMITATIONS locomotion level  PARTICIPATION LIMITATIONS: occupation  PERSONAL FACTORS  N/A  are also affecting patient's functional outcome.   REHAB POTENTIAL: Excellent  CLINICAL DECISION MAKING: Stable/uncomplicated  EVALUATION COMPLEXITY: Low   GOALS: Goals reviewed with patient? Yes   SHORT TERM GOALS: Target date: 08/17/2022    Independent with initial HEP. Baseline:  Goal status: MET  2. Balance goal set after 08/17/22. Baseline:  Goal status: MET    LONG TERM GOALS: Target date: 09/14/2022   Independent with advanced/ongoing HEP to improve outcomes and carryover.  Baseline:  Goal status: IN PROGRESS  2.  [name] will demonstrate left knee flexion to 120 deg to ascend/descend stairs. Baseline:  Goal status: PARTIALLY MET  08/15/22 - L knee flexion 129 but stairs not yet attempted reciprocally due to [place] restriction  3.  [name] will demonstrate full L knee extension for safety with gait. Baseline:  Goal status: PARTIALLY MET  08/15/22 - L knee extension 0 in supine but lacking 5-6 in seated LAQ  4.  [name] will be able to ambulate 600' safely without AD and normal gait pattern to access community.  Baseline:  Goal status: IN PROGRESS  5.  [name] will be able to ascend/descend stairs with 1 HR and reciprocal step pattern safely to access home and community.  Baseline:  Goal status: IN PROGRESS  6.  [name] will demonstrate >= 55 on LEFS to demonstrate improved mobility.  Baseline: 45 Goal status: IN PROGRESS  7.  [name] will demonstrate improved balance by being able to perform SLS > 7 sec bil. Baseline:  Goal  status: IN PROGRESS   PLAN: PT FREQUENCY: 2x/week  PT DURATION: 8 weeks  PLANNED INTERVENTIONS: Therapeutic exercises, Therapeutic activity, Neuromuscular re-education, Balance training, Gait training, Patient/Family education, Self Care, Joint mobilization, Stair training, Aquatic Therapy, Dry Needling, Electrical stimulation, Cryotherapy, Moist heat, scar mobilization, Taping, Vasopneumatic device, Ionotophoresis 7m/ml Dexamethasone, and Manual therapy  PLAN FOR NEXT SESSION:  reassess L knee ROM; ROM/strength for L knee stability and functional activities including stairs.  Pt at FWB status. Work on higher level balance.   JPercival Spanish PT 09/06/2022, 10:06 AM

## 2022-09-09 ENCOUNTER — Ambulatory Visit: Payer: Medicare HMO

## 2022-09-09 DIAGNOSIS — M67431 Ganglion, right wrist: Secondary | ICD-10-CM | POA: Diagnosis not present

## 2022-09-09 DIAGNOSIS — M25562 Pain in left knee: Secondary | ICD-10-CM

## 2022-09-09 DIAGNOSIS — M25662 Stiffness of left knee, not elsewhere classified: Secondary | ICD-10-CM

## 2022-09-09 DIAGNOSIS — R2689 Other abnormalities of gait and mobility: Secondary | ICD-10-CM | POA: Diagnosis not present

## 2022-09-09 NOTE — Therapy (Signed)
OUTPATIENT PHYSICAL THERAPY TREATMENT   Patient Name: Ronald Bond MRN: [id number] DOB:Aug 17, 1946, 76 y.o., male Today's Date: 09/09/2022    PT End of Session - 09/09/22 1100     Visit Number 7    Number of Visits 16    Date for PT Re-Evaluation 09/14/22    Authorization Type [employer] MCR    Progress Note Due on Visit 13   MD PN on visit #3 - 08/15/22   PT Start Time 7902    PT Stop Time 1058    PT Time Calculation (min) 39 min    Activity Tolerance Patient tolerated treatment well    Behavior During Therapy [facility] for tasks assessed/performed                  Past Medical History:  Diagnosis Date   GERD (gastroesophageal reflux disease)    HTN (hypertension)    Hyperlipidemia    Hypothyroidism    Osteoarthritis    Pre-diabetes    Wears glasses    Past Surgical History:  Procedure Laterality Date   COLONOSCOPY     EYE SURGERY  2006   both cataracts   MICROLARYNGOSCOPY Left 11/18/2013   Procedure: MICROLARYNGOSCOPY WITH REMOVAL OF GRANULOMA LEFT SIDE;  Surgeon: Izora Gala, MD;  Location: [place];  Service: ENT;  Laterality: Left;   NM MYOVIEW LTD  06/2007   Low risk, normal.  No evidence of ischemia or infarction   THYROIDECTOMY  1974   TONSILLECTOMY     as a child   TOTAL KNEE ARTHROPLASTY  2009   left   TOTAL KNEE REVISION Left 07/06/2022   Procedure: TOTAL KNEE REVISION;  Surgeon: Willaim Sheng, MD;  Location: [facility];  Service: Orthopedics;  Laterality: Left;   Patient Active Problem List   Diagnosis Date Noted   Intractable hiccoughs 07/08/2022   Failed total knee arthroplasty ([place]) 07/06/2022   PAC (premature atrial contraction) 12/27/2021   Encounter for general adult medical examination with abnormal findings 06/22/2021   Bradycardia 11/30/2020   Graves' orbitopathy 07/13/2020   Type II diabetes mellitus with manifestations ([place]) 05/13/2020   Graves disease 10/07/2019   Postablative hypothyroidism 10/07/2019    Thyroid-related proptosis 08/15/2019   Chronic renal disease, stage 3, moderately decreased glomerular filtration rate (GFR) between 30-59 mL/min/1.73 square meter ([place]) 08/15/2019   Long-term current use of opiate analgesic 05/14/2018   Bilateral epiphora 11/16/2017   Thiamine deficiency 06/08/2017   Obesity (BMI 30.0-34.9) 06/04/2017   Hypertensive heart disease without CHF (congestive heart failure) 12/25/2016   Right cervical radiculopathy 06/16/2016   Iron deficiency anemia 03/08/2016   Spinal stenosis in cervical region 08/25/2015   Allergic eczema 08/07/2014   Obstructive sleep apnea 11/12/2013   Mild intermittent asthma 06/03/2013   Herniated lumbar disc without myelopathy 10/18/2012   Allergic rhinitis 03/10/2011   Cardiomegaly 10/18/2010   ERECTILE DYSFUNCTION, NON-ORGANIC, MILD 06/29/2009   Hyperlipidemia associated with type 2 diabetes mellitus ([place]) 05/28/2009   Essential hypertension, benign 05/28/2009   GERD 05/28/2009   BPH associated with nocturia 05/28/2009   Primary osteoarthritis of right knee 05/28/2009    PCP: Janith Lima, MD   REFERRING PROVIDER: Willaim Sheng, MD   REFERRING DIAG: [phone] (ICD-10-CM) - Status post revision of total knee replacement, bilateral  THERAPY DIAG:  Stiffness of left knee, not elsewhere classified  Acute pain of left knee  Other abnormalities of gait and mobility  RATIONALE FOR EVALUATION AND TREATMENT: Rehabilitation  ONSET DATE: 07/06/22  NEXT MD VISIT: ~09/29/22   SUBJECTIVE:   SUBJECTIVE STATEMENT: Pt reports he is doing good now new complaints.  PAIN:  Are you having pain? No  PERTINENT HISTORY: HTN, OA, prediabetic. Left TKR 2008, Revision 2023  PRECAUTIONS: None  WEIGHT BEARING RESTRICTIONS Yes 50% WBing on L until 6 weeks post-op - 08/17/22  FALLS:  Has patient fallen in last 6 months? 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  PATIENT SURVEYS:  LEFS 45  COGNITION:  Overall cognitive status: Within functional limits for tasks assessed     SENSATION: WFL  EDEMA:  Circumferential: R knee  42 cm  L knee 46 cm  MUSCLE LENGTH: Mild tightness L HS  POSTURE: No Significant postural limitations  PALPATION: unremarkable  LOWER EXTREMITY ROM:   AROM Right eval Left  eval Left 08/12/22 Left 08/15/22 Left 09/09/22  Hip flexion  WFL     Hip extension  WFL     Hip abduction  WFL     Hip adduction  WFL     Hip internal rotation       Hip external rotation  WFL     Knee flexion 117 110 128 129 125 - sitting  Knee extension  -3 0 0 supine -5 seated 0 - supported in sitting  Ankle dorsiflexion  WNL        PROM Left  eval  Hip flexion   Hip extension   Hip abduction   Hip adduction   Hip internal rotation   Hip external rotation   Knee flexion 117  Knee extension 0  Ankle dorsiflexion    (Blank rows = not tested)  LOWER EXTREMITY MMT:  MMT Right eval Left eval  Hip flexion    Hip extension 5 5  Hip abduction 5 5  Hip adduction 5 5  Hip internal rotation    Hip external rotation    Knee flexion 5 4+  Knee extension 5 4+  Ankle dorsiflexion 5 5   (Blank rows = not tested)   FUNCTIONAL TESTS: 08/23/22 BERG - 52/56 5XSTS - 14.92 sec   GAIT: Distance walked: 200 ft Assistive device utilized: Walker - 2 wheeled Level of assistance: Modified independence Comments: using 50% WB on LLE; also ascended/descended curb and got into his truck independently using  walker.    TODAY'S TREATMENT: 09/09/22 Therapeutic Exercise: Nustep L7x34mn Knee extension 15#, 20# x 10 each BLE Knee flexion 20# 2x10 BLE Leg press 35# 2x10 BLE TRX squats x 10  TRX side lunge L side x 10 unable to do R Eccentric step down 8' R x 10; L tap to foam roll x 10  09/06/22 THERAPEUTIC EXERCISE: to improve flexibility, strength and mobility.  Verbal and tactile cues throughout for technique.  NuStep - L7 x 5 min L lateral and fwd eccentric lowering R heel to floor from 8" step 2 x 10 L SLS + 5-way tap to colored dots 2 x 5 TRX squat 2 x 10 - clicking/popping in L knee, but no pain TRX L/R  lunge x 10 each - increased pain in R knee with R foot fwd   08/30/22 THERAPEUTIC EXERCISE: to improve flexibility, strength and mobility.  Verbal and tactile cues throughout for technique.  NuStep - L6 x 6 min L lateral and fwd eccentric lowering/step-downs from 6" step 2 x 10 L blue TB TKE 20 x 5" Supported squat + blue TB TKE 2 x 10 L/R Fitter abduction (1 black/1 blue) 2 x 10, UE support on back of chair for balance L/R Fitter extension (1 black/1 blue) 2 x 10, UE support on back of chair for balance  GAIT TRAINING: To normalize gait pattern. Stairs:  Level of Assistance: Modified independence  Stair Negotiation Technique: Alternating Pattern  with Single Rail on Right on descent only  Number of Stairs: 14   Height of Stairs: 7  Comments: limited eccentric quad control on L      PATIENT EDUCATION:  Education details: HEP progressed 08/29/22 Person educated: Patient Education method: Explanation, Demonstration, Verbal cues, and Handouts Education comprehension: verbalized understanding and returned demonstration   HOME EXERCISE PROGRAM: Access Code: [email or web address] URL: [email or web address] Date: 08/30/2022 Prepared by: Annie Paras  Exercises - Supine Hamstring Stretch with Strap  - 2 x daily - 7 x weekly - 1 sets - 3 reps - 60 sec hold - Supine  Quadriceps Stretch with Strap on Table  - 2 x daily - 7 x weekly - 1 sets - 3 reps - 30-60 sec hold - Supine Knee Extension Strengthening  - 1 x daily - 7 x weekly - 2 sets - 10 reps - Supine Active Straight Leg Raise (Mirrored)  - 2 x daily - 7 x weekly - 3 sets - 10 reps - Prone Knee Flexion AROM  - 1 x daily - 7 x weekly - 3 sets - 10 reps - Sit to Stand  - 3 x daily - 7 x weekly - 1 sets - 10 reps - Forward Step Up  - 1 x daily - 7 x weekly - 2 sets - 10 reps - Forward Step Down Touch with Toe  - 1 x daily - 7 x weekly - 2 sets - 10 reps - Sideways Step Touch  - 1 x daily - 7 x weekly - 2 sets - 10 reps - Standing Hip Flexion with Resistance Loop  - 1 x daily - 3 x weekly - 3 sets - 10 reps - Standing Hip Abduction with Resistance at Ankles and Counter Support  - 1 x daily - 3 x weekly - 3 sets - 10 reps - Standing Hip Extension with Resistance at Ankles and Counter Support  - 1 x daily - 3 x weekly - 3 sets - 10 reps - Standing Terminal Knee Extension with Resistance  - 1 x daily - 3 x weekly - 2 sets - 10 reps - 5 sec hold - Side Step Down with Counter Support  - 1 x daily - 3 x weekly - 2 sets - 10 reps - 3 sec hold - Forward Step Down with Heel Tap and Counter Support  - 1 x daily - 3 x weekly - 2 sets - 10 reps - 3 sec hold   ASSESSMENT:  CLINICAL IMPRESSION: Pt continued to demo difficulty with eccentric loading of R quad going downstairs. L knee AROM was 0-120 today in sitting. Pt demonstrated a good response to TE today, although noting some pain with step downs on R LE and side lunges. Added weight machines today  with no issues and encouraged patient to join gym to have access to the same machines. He shows good progress but would like to work more on strengthening L knee before discharging,so would recommend reducing frequency to 1x per week as seen fit by supervising therapist.  OBJECTIVE IMPAIRMENTS Abnormal gait, decreased balance, decreased ROM, decreased strength, increased  edema, impaired flexibility, and pain.   ACTIVITY LIMITATIONS locomotion level  PARTICIPATION LIMITATIONS: occupation  PERSONAL FACTORS  N/A  are also affecting patient's functional outcome.   REHAB POTENTIAL: Excellent  CLINICAL DECISION MAKING: Stable/uncomplicated  EVALUATION COMPLEXITY: Low   GOALS: Goals reviewed with patient? Yes   SHORT TERM GOALS: Target date: 08/17/2022    Independent with initial HEP. Baseline:  Goal status: MET  2. Balance goal set after 08/17/22. Baseline:  Goal status: MET    LONG TERM GOALS: Target date: 09/14/2022   Independent with advanced/ongoing HEP to improve outcomes and carryover.  Baseline:  Goal status: IN PROGRESS  2.  [name] will demonstrate left knee flexion to 120 deg to ascend/descend stairs. Baseline:  Goal status: PARTIALLY MET  08/15/22 - L knee flexion 129 but stairs not yet attempted reciprocally due to [place] restriction  3.  [name] will demonstrate full L knee extension for safety with gait. Baseline:  Goal status: PARTIALLY MET  08/15/22 - L knee extension 0 in supine but lacking 5-6 in seated LAQ  4.  [name] will be able to ambulate 600' safely without AD and normal gait pattern to access community.  Baseline:  Goal status: IN PROGRESS  5.  [name] will be able to ascend/descend stairs with 1 HR and reciprocal step pattern safely to access home and community.  Baseline:  Goal status: IN PROGRESS - 09/09/22 Decreased eccentric control when descending with L LE  6.  [name] will demonstrate >= 55 on LEFS to demonstrate improved mobility.  Baseline: 45 Goal status: IN PROGRESS  7.  [name] will demonstrate improved balance by being able to perform SLS > 7 sec bil. Baseline:  Goal status: IN PROGRESS   PLAN: PT FREQUENCY: 2x/week  PT DURATION: 8 weeks  PLANNED INTERVENTIONS: Therapeutic exercises, Therapeutic activity, Neuromuscular re-education,  Balance training, Gait training, Patient/Family education, Self Care, Joint mobilization, Stair training, Aquatic Therapy, Dry Needling, Electrical stimulation, Cryotherapy, Moist heat, scar mobilization, Taping, Vasopneumatic device, Ionotophoresis 64m/ml Dexamethasone, and Manual therapy  PLAN FOR NEXT SESSION:  recert; reduce freq to 1x per week ROM/strength for L knee stability and functional activities including stairs.  Pt at FWB status. Work on higher level balance.   BArtist Pais PTA 09/09/2022, 11:06 AM

## 2022-09-13 ENCOUNTER — Ambulatory Visit: Payer: Medicare HMO | Admitting: Physical Therapy

## 2022-09-13 ENCOUNTER — Encounter: Payer: Self-pay | Admitting: Physical Therapy

## 2022-09-13 DIAGNOSIS — M25662 Stiffness of left knee, not elsewhere classified: Secondary | ICD-10-CM | POA: Diagnosis not present

## 2022-09-13 DIAGNOSIS — M25562 Pain in left knee: Secondary | ICD-10-CM | POA: Diagnosis not present

## 2022-09-13 DIAGNOSIS — R2689 Other abnormalities of gait and mobility: Secondary | ICD-10-CM | POA: Diagnosis not present

## 2022-09-13 NOTE — Therapy (Signed)
OUTPATIENT PHYSICAL THERAPY TREATMENT / RE-CERTIFICATION  Patient Name: Ronald Bond MRN: [id number] DOB:October 20, 1946, 76 y.o., male Today's Date: 09/13/2022  Progress Note  Reporting Period 08/15/2022 to 09/13/2022  See note below for Objective Data and Assessment of Progress/Goals.      PT End of Session - 09/13/22 0928     Visit Number 8    Number of Visits 12    Date for PT Re-Evaluation 10/11/22    Authorization Type [employer] M[employer]    Progress Note Due on Visit 12   Recert on visit #8 - 36/14/43   PT Start Time 0928    PT Stop Time 1014    PT Time Calculation (min) 46 min    Activity Tolerance Patient tolerated treatment well    Behavior During Therapy [facility] for tasks assessed/performed                   Past Medical History:  Diagnosis Date   GERD (gastroesophageal reflux disease)    HTN (hypertension)    Hyperlipidemia    Hypothyroidism    Osteoarthritis    Pre-diabetes    Wears glasses    Past Surgical History:  Procedure Laterality Date   COLONOSCOPY     EYE SURGERY  2006   both cataracts   MICROLARYNGOSCOPY Left 11/18/2013   Procedure: MICROLARYNGOSCOPY WITH REMOVAL OF GRANULOMA LEFT SIDE;  Surgeon: Izora Gala, MD;  Location: [place];  Service: ENT;  Laterality: Left;   NM MYOVIEW LTD  06/2007   Low risk, normal.  No evidence of ischemia or infarction   THYROIDECTOMY  1974   TONSILLECTOMY     as a child   TOTAL KNEE ARTHROPLASTY  2009   left   TOTAL KNEE REVISION Left 07/06/2022   Procedure: TOTAL KNEE REVISION;  Surgeon: Willaim Sheng, MD;  Location: [facility];  Service: Orthopedics;  Laterality: Left;   Patient Active Problem List   Diagnosis Date Noted   Intractable hiccoughs 07/08/2022   Failed total knee arthroplasty ([place]) 07/06/2022   PAC (premature atrial contraction) 12/27/2021   Encounter for general adult medical examination with abnormal findings 06/22/2021   Bradycardia 11/30/2020   Graves' orbitopathy  07/13/2020   Type II diabetes mellitus with manifestations ([place]) 05/13/2020   Graves disease 10/07/2019   Postablative hypothyroidism 10/07/2019   Thyroid-related proptosis 08/15/2019   Chronic renal disease, stage 3, moderately decreased glomerular filtration rate (GFR) between 30-59 mL/min/1.73 square meter ([place]) 08/15/2019   Long-term current use of opiate analgesic 05/14/2018   Bilateral epiphora 11/16/2017   Thiamine deficiency 06/08/2017   Obesity (BMI 30.0-34.9) 06/04/2017   Hypertensive heart disease without CHF (congestive heart failure) 12/25/2016   Right cervical radiculopathy 06/16/2016   Iron deficiency anemia 03/08/2016   Spinal stenosis in cervical region 08/25/2015   Allergic eczema 08/07/2014   Obstructive sleep apnea 11/12/2013   Mild intermittent asthma 06/03/2013   Herniated lumbar disc without myelopathy 10/18/2012   Allergic rhinitis 03/10/2011   Cardiomegaly 10/18/2010   ERECTILE DYSFUNCTION, NON-ORGANIC, MILD 06/29/2009   Hyperlipidemia associated with type 2 diabetes mellitus ([place]) 05/28/2009   Essential hypertension, benign 05/28/2009   GERD 05/28/2009   BPH associated with nocturia 05/28/2009   Primary osteoarthritis of right knee 05/28/2009    PCP: Janith Lima, MD   REFERRING PROVIDER: Willaim Sheng, MD   REFERRING DIAG: [id number] (ICD-10-CM) - Status post revision of total knee replacement, bilateral  THERAPY DIAG:  Stiffness of left knee, not elsewhere classified  Acute pain of left knee  Other abnormalities of gait and mobility  RATIONALE FOR EVALUATION AND TREATMENT: Rehabilitation  ONSET DATE: 07/06/22  NEXT MD VISIT: ~09/29/22   SUBJECTIVE:   SUBJECTIVE STATEMENT: Pt reports he is doing good - no new complaints, but feels that he needs more PT to work on things like stair negotiation.  PAIN:  Are you having pain? No  PERTINENT HISTORY: HTN, OA, prediabetic. Left TKR 2008, Revision 2023  PRECAUTIONS: None  WEIGHT  BEARING RESTRICTIONS Yes 50% WBing on L until 6 weeks post-op - 08/17/22  FALLS:  Has patient fallen in last 6 months? No  LIVING ENVIRONMENT: Lives with: lives with their family Lives in: House/apartment Stairs: Yes: External: 4 steps; bilateral but cannot reach both Has following equipment at home: Single point cane and Walker - 2 wheeled  OCCUPATION: works for the [employer] system  PLOF: [place] resume his normal activities, walks about a mile during his day at work   OBJECTIVE:   DIAGNOSTIC FINDINGS:  XR 07/06/22: Status post revision of total left knee arthroplasty without evidence of hardware failure.  PATIENT SURVEYS:  LEFS 45  COGNITION:  Overall cognitive status: Within functional limits for tasks assessed     SENSATION: WFL  EDEMA:  Circumferential: R knee  42 cm  L knee 46 cm  MUSCLE LENGTH: Mild tightness L HS  POSTURE: No Significant postural limitations  PALPATION: unremarkable  LOWER EXTREMITY ROM:   AROM Right eval Left  eval Left 08/12/22 Left 08/15/22 Left 09/09/22 Left 09/13/22  Hip flexion  WFL      Hip extension  WFL      Hip abduction  WFL      Hip adduction  WFL      Hip internal rotation        Hip external rotation  WFL      Knee flexion 117 110 128 129 125 - sitting 128  Knee extension  -3 0 0 supine -5 seated 0 - supported in sitting 0 supine -5 seated  Ankle dorsiflexion  WNL         PROM Left  eval  Hip flexion   Hip extension   Hip abduction   Hip adduction   Hip internal rotation   Hip external rotation   Knee flexion 117  Knee extension 0  Ankle dorsiflexion    (Blank rows = not tested)  LOWER EXTREMITY MMT:  MMT Right eval Left eval Right 09/13/22 Left 09/13/22  Hip flexion   5 4+  Hip extension _0 4+  Hip abduction _1 Hip adduction _2 4+  Hip internal rotation   5 4+  Hip external rotation   4 4- L knee pain  Knee flexion 5 4+ 5 4+ Pain in lateral knee  Knee  extension 5 4+ 5 5-  Ankle dorsiflexion _3 (Blank rows = not tested)   FUNCTIONAL TESTS: 08/23/22 BERG - 52/56 5XSTS - 14.92 sec   GAIT: Distance walked: 200 ft Assistive device utilized: Walker - 2 wheeled Level of assistance: Modified independence Comments: using 50% WB on LLE; also ascended/descended curb and got into his truck independently using walker.    TODAY'S TREATMENT:  09/13/22 THERAPEUTIC EXERCISE: to improve flexibility, strength and mobility.  Verbal and tactile cues throughout for technique. Rec Bike - L3 x 6 min Standing GTB 4-way SLR x 10 bil  THERAPEUTIC ACTIVITIES: ROM & MMT  5XSTS - 14.60 sec SLS: R = 7.34 sec, L= 12.03 sec LEFS: 53 / 80 = 66.3%  GAIT TRAINING: To normalize gait pattern. STAIRS:  Level of Assistance: Modified independence  Stair Negotiation Technique: Alternating Pattern  Forwards with No Rails Single Rail on Right  Number of Stairs: 14 x 2   Height of Stairs: 7  Comments: increased effort with fwd/upward momentum of body when L LE leading on stair ascent w/o UE support on rail, decreased hip stability and L quad control on stair descent w/o UE support on rail; more fluid reciprocal step pattern with single UE support on R railing but still demonstrates limited eccentric control   09/09/22 Therapeutic Exercise: Nustep L7x31mn Knee extension 15#, 20# x 10 each BLE Knee flexion 20# 2x10 BLE Leg press 35# 2x10 BLE TRX squats x 10  TRX side lunge L side x 10 unable to do R Eccentric step down 8' R x 10; L tap to foam roll x 10  09/06/22 THERAPEUTIC EXERCISE: to improve flexibility, strength and mobility.  Verbal and tactile cues throughout for technique.  NuStep - L7 x 5 min L lateral and fwd eccentric lowering R heel to floor from 8" step 2 x 10 L SLS + 5-way tap to colored dots 2 x 5 TRX squat 2 x 10 - clicking/popping in L knee, but no pain TRX L/R lunge x 10 each - increased pain in R knee with R foot  fwd   08/30/22 THERAPEUTIC EXERCISE: to improve flexibility, strength and mobility.  Verbal and tactile cues throughout for technique.  NuStep - L6 x 6 min L lateral and fwd eccentric lowering/step-downs from 6" step 2 x 10 L blue TB TKE 20 x 5" Supported squat + blue TB TKE 2 x 10 L/R Fitter abduction (1 black/1 blue) 2 x 10, UE support on back of chair for balance L/R Fitter extension (1 black/1 blue) 2 x 10, UE support on back of chair for balance  GAIT TRAINING: To normalize gait pattern. Stairs:  Level of Assistance: Modified independence  Stair Negotiation Technique: Alternating Pattern  with Single Rail on Right on descent only  Number of Stairs: 14   Height of Stairs: 7  Comments: limited eccentric quad control on L   PATIENT EDUCATION:  Education details: HEP progressed 08/29/22 Person educated: Patient Education method: Explanation, Demonstration, Verbal cues, and Handouts Education comprehension: verbalized understanding and returned demonstration   HOME EXERCISE PROGRAM: Access Code: [email or web address]: [email or web address] Date: 09/13/2022 Prepared by: JAnnie Paras Exercises - Supine Hamstring Stretch with Strap  - 2 x daily - 7 x weekly - 1 sets - 3 reps - 60 sec hold - Supine Quadriceps Stretch with Strap on Table  - 2 x daily - 7 x weekly - 1 sets - 3 reps - 30-60 sec hold - Supine Knee Extension Strengthening  - 1 x daily - 7 x weekly - 2 sets - 10 reps - Supine Active Straight Leg Raise (Mirrored)  - 2 x daily - 7 x weekly - 3 sets - 10 reps - Prone Knee Flexion AROM  - 1 x daily - 7 x weekly - 3 sets - 10 reps - Sit to Stand  - 3 x daily - 7 x weekly - 1 sets - 10 reps - Forward Step Up  - 1 x daily - 7 x weekly - 2 sets - 10 reps - Forward Step Down Touch with Toe  - 1 x daily - 7 x  weekly - 2 sets - 10 reps - Sideways Step Touch  - 1 x daily - 7 x weekly - 2 sets - 10 reps - Standing Terminal Knee Extension with Resistance  - 1 x daily - 3 x  weekly - 2 sets - 10 reps - 5 sec hold - Side Step Down with Counter Support  - 1 x daily - 3 x weekly - 2 sets - 10 reps - 3 sec hold - Forward Step Down with Heel Tap and Counter Support  - 1 x daily - 3 x weekly - 2 sets - 10 reps - 3 sec hold - Standing Hip Flexion with Anchored Resistance and Chair Support  - 1 x daily - 3 x weekly - 2 sets - 10 reps - 3 sec hold - Standing Hip Abduction with Anchored Resistance  - 1 x daily - 3 x weekly - 2 sets - 10 reps - 3 sec hold - Standing Hip Extension with Anchored Resistance  - 1 x daily - 3 x weekly - 2 sets - 10 reps - 3 sec hold - Standing Hip Adduction with Anchored Resistance  - 1 x daily - 3 x weekly - 2 sets - 10 reps - 3 sec hold   ASSESSMENT:  CLINICAL IMPRESSION: Heberto reports he has been able to walk w/o difficulty w/o an AD but notes some increased pain with longer distances and reports difficulty with reciprocal stair negotiation. His L knee AROM is excellent at 0-128 with ~5 degrees lacking in seated LAQ due to HS tightness and mild quad weakness. Weakness also still evident proximally in L hip leading to some instability and fatigue with gait and stair negotiation. Goals only partially met at this time and Johne would like to continue with PT to address the remaining deficits, therefore will recommend recert for an additional 4 weeks with frequency reduced to 1x/wk as he resumes going to the gym/[employer] to workout. HEP updated today to better target the continued proximal LE weakness as well as balance deficits and will plan for further HEP updates/progression as tolerated as well as clarification of appropriate gym focused exercises in the the next few visits.  OBJECTIVE IMPAIRMENTS Abnormal gait, decreased balance, decreased ROM, decreased strength, increased edema, impaired flexibility, and pain.   ACTIVITY LIMITATIONS locomotion level  PARTICIPATION LIMITATIONS: occupation  PERSONAL FACTORS  N/A  are also affecting patient's  functional outcome.   REHAB POTENTIAL: Excellent  CLINICAL DECISION MAKING: Stable/uncomplicated  EVALUATION COMPLEXITY: Low   GOALS: Goals reviewed with patient? Yes   SHORT TERM GOALS: Target date: 08/17/2022    Independent with initial HEP. Baseline:  Goal status: MET  2. Balance goal set after 08/17/22. Baseline:  Goal status: MET    LONG TERM GOALS: Target date: 09/14/2022, extended to 10/11/22  Independent with advanced/ongoing HEP to improve outcomes and carryover.  Baseline:  Goal status: IN PROGRESS  09/13/22 - Met for current HEP but updated today  2.  [name] will demonstrate left knee flexion to 120 deg to ascend/descend stairs. Baseline:  Goal status: MET  09/13/22 - L knee flexion 128; pt able to ascend/descend stairs reciprocally  3.  [name] will demonstrate full L knee extension for safety with gait. Baseline:  Goal status: PARTIALLY MET  09/13/22 - L knee extension 0 in supine but lacking 5 in seated LAQ  4.  [name] will be able to ambulate 600' safely without AD and normal gait pattern to access  community.  Baseline:  Goal status: MET  09/13/22  5.  [name] will be able to ascend/descend stairs with 1 HR and reciprocal step pattern safely to access home and community.  Baseline:  Goal status: IN PROGRESS  09/13/22 - increased effort with fwd/upward momentum of body when L LE leading on stair ascent w/o UE support on rail, decreased hip stability and L quad control on stair descent w/o UE support on rail; more fluid reciprocal step pattern with single UE support on R railing but still demonstrates limited eccentric control  6.  [name] will demonstrate >/= 55 on LEFS to demonstrate improved mobility.  Baseline: 45 Goal status: IN PROGRESS  09/13/22 - LEFS: 53 / 80 = 66.3%  7.  [name] will demonstrate improved balance by being able to perform SLS > 7 sec bil. Baseline:  Goal status: MET   09/13/22 - SLS: R = 7.34 sec, L= 12.03 sec   PLAN: PT FREQUENCY: 1x/week  PT DURATION: 4 weeks  PLANNED INTERVENTIONS: Therapeutic exercises, Therapeutic activity, Neuromuscular re-education, Balance training, Gait training, Patient/Family education, Self Care, Joint mobilization, Stair training, Aquatic Therapy, Dry Needling, Electrical stimulation, Cryotherapy, Moist heat, scar mobilization, Taping, Vasopneumatic device, Ionotophoresis 62m/ml Dexamethasone, and Manual therapy  PLAN FOR NEXT SESSION:  strengthening for L hip/knee stability and functional activities including stairs.  Pt at FWB status. Work on higher level balance.   JPercival Spanish PT 09/13/2022, 12:44 PM

## 2022-09-19 ENCOUNTER — Other Ambulatory Visit: Payer: Self-pay | Admitting: Internal Medicine

## 2022-09-19 DIAGNOSIS — M25562 Pain in left knee: Secondary | ICD-10-CM

## 2022-09-21 ENCOUNTER — Ambulatory Visit: Payer: Medicare HMO | Admitting: Physical Therapy

## 2022-09-24 ENCOUNTER — Other Ambulatory Visit: Payer: Self-pay | Admitting: Internal Medicine

## 2022-09-24 DIAGNOSIS — I119 Hypertensive heart disease without heart failure: Secondary | ICD-10-CM

## 2022-09-28 ENCOUNTER — Ambulatory Visit: Payer: Medicare HMO | Attending: Orthopedic Surgery | Admitting: Physical Therapy

## 2022-09-28 ENCOUNTER — Encounter: Payer: Self-pay | Admitting: Physical Therapy

## 2022-09-28 DIAGNOSIS — M25662 Stiffness of left knee, not elsewhere classified: Secondary | ICD-10-CM | POA: Diagnosis not present

## 2022-09-28 DIAGNOSIS — R2689 Other abnormalities of gait and mobility: Secondary | ICD-10-CM | POA: Diagnosis not present

## 2022-09-28 DIAGNOSIS — M25562 Pain in left knee: Secondary | ICD-10-CM | POA: Insufficient documentation

## 2022-09-28 NOTE — Therapy (Signed)
OUTPATIENT PHYSICAL THERAPY TREATMENT   Patient Name: Ronald Bond MRN: [id number] DOB:1946/02/12, 76 y.o., male Today's Date: 09/28/2022        PT End of Session - 09/28/22 0844     Visit Number 9    Number of Visits 12    Date for PT Re-Evaluation 10/11/22    Authorization Type [employer] M[employer]    Progress Note Due on Visit 12   Recert on visit #8 - 74/94/49   PT Start Time 0844    PT Stop Time 0929    PT Time Calculation (min) 45 min    Activity Tolerance Patient tolerated treatment well    Behavior During Therapy [facility] for tasks assessed/performed                    Past Medical History:  Diagnosis Date   GERD (gastroesophageal reflux disease)    HTN (hypertension)    Hyperlipidemia    Hypothyroidism    Osteoarthritis    Pre-diabetes    Wears glasses    Past Surgical History:  Procedure Laterality Date   COLONOSCOPY     EYE SURGERY  2006   both cataracts   MICROLARYNGOSCOPY Left 11/18/2013   Procedure: MICROLARYNGOSCOPY WITH REMOVAL OF GRANULOMA LEFT SIDE;  Surgeon: Izora Gala, MD;  Location: [place];  Service: ENT;  Laterality: Left;   NM MYOVIEW LTD  06/2007   Low risk, normal.  No evidence of ischemia or infarction   THYROIDECTOMY  1974   TONSILLECTOMY     as a child   TOTAL KNEE ARTHROPLASTY  2009   left   TOTAL KNEE REVISION Left 07/06/2022   Procedure: TOTAL KNEE REVISION;  Surgeon: Willaim Sheng, MD;  Location: [facility];  Service: Orthopedics;  Laterality: Left;   Patient Active Problem List   Diagnosis Date Noted   Intractable hiccoughs 07/08/2022   Failed total knee arthroplasty ([place]) 07/06/2022   PAC (premature atrial contraction) 12/27/2021   Encounter for general adult medical examination with abnormal findings 06/22/2021   Bradycardia 11/30/2020   Graves' orbitopathy 07/13/2020   Type II diabetes mellitus with manifestations ([place]) 05/13/2020   Graves disease 10/07/2019   Postablative hypothyroidism 10/07/2019    Thyroid-related proptosis 08/15/2019   Chronic renal disease, stage 3, moderately decreased glomerular filtration rate (GFR) between 30-59 mL/min/1.73 square meter ([place]) 08/15/2019   Long-term current use of opiate analgesic 05/14/2018   Bilateral epiphora 11/16/2017   Thiamine deficiency 06/08/2017   Obesity (BMI 30.0-34.9) 06/04/2017   Hypertensive heart disease without CHF (congestive heart failure) 12/25/2016   Right cervical radiculopathy 06/16/2016   Iron deficiency anemia 03/08/2016   Spinal stenosis in cervical region 08/25/2015   Allergic eczema 08/07/2014   Obstructive sleep apnea 11/12/2013   Mild intermittent asthma 06/03/2013   Herniated lumbar disc without myelopathy 10/18/2012   Allergic rhinitis 03/10/2011   Cardiomegaly 10/18/2010   ERECTILE DYSFUNCTION, NON-ORGANIC, MILD 06/29/2009   Hyperlipidemia associated with type 2 diabetes mellitus ([place]) 05/28/2009   Essential hypertension, benign 05/28/2009   GERD 05/28/2009   BPH associated with nocturia 05/28/2009   Primary osteoarthritis of right knee 05/28/2009    PCP: Janith Lima, MD   REFERRING PROVIDER: Willaim Sheng, MD   REFERRING DIAG: [phone] (ICD-10-CM) - Status post revision of total knee replacement, bilateral  THERAPY DIAG:  Stiffness of left knee, not elsewhere classified  Acute pain of left knee  Other abnormalities of gait and mobility  RATIONALE FOR EVALUATION AND TREATMENT:  Rehabilitation  ONSET DATE: 07/06/22  NEXT MD VISIT: ~09/29/22   SUBJECTIVE:   SUBJECTIVE STATEMENT: Pt reports his knee seems to ache more now that the weather is getting colder. Also noting "popping" or "cracking" at times when walking or on the bike.  PAIN:  Are you having pain? No  PERTINENT HISTORY: HTN, OA, prediabetic. Left TKR 2008, Revision 2023  PRECAUTIONS: None  WEIGHT BEARING RESTRICTIONS Yes 50% WBing on L until 6 weeks post-op - 08/17/22  FALLS:  Has patient fallen in last 6 months?  No  LIVING ENVIRONMENT: Lives with: lives with their family Lives in: House/apartment Stairs: Yes: External: 4 steps; bilateral but cannot reach both Has following equipment at home: Single point cane and Walker - 2 wheeled  OCCUPATION: works for the [employer]  PLOF: [place] resume his normal activities, walks about a mile during his day at work   OBJECTIVE:   DIAGNOSTIC FINDINGS:  XR 07/06/22: Status post revision of total left knee arthroplasty without evidence of hardware failure.  PATIENT SURVEYS:  LEFS 45  COGNITION:  Overall cognitive status: Within functional limits for tasks assessed     SENSATION: WFL  EDEMA:  Circumferential: R knee  42 cm  L knee 46 cm  MUSCLE LENGTH: Mild tightness L HS  POSTURE: No Significant postural limitations  PALPATION: unremarkable  LOWER EXTREMITY ROM:   AROM Right eval Left  eval Left 08/12/22 Left 08/15/22 Left 09/09/22 Left 09/13/22  Hip flexion  WFL      Hip extension  WFL      Hip abduction  WFL      Hip adduction  WFL      Hip internal rotation        Hip external rotation  WFL      Knee flexion 117 110 128 129 125 - sitting 128  Knee extension  -3 0 0 supine -5 seated 0 - supported in sitting 0 supine -5 seated  Ankle dorsiflexion  WNL         PROM Left  eval  Hip flexion   Hip extension   Hip abduction   Hip adduction   Hip internal rotation   Hip external rotation   Knee flexion 117  Knee extension 0  Ankle dorsiflexion    (Blank rows = not tested)  LOWER EXTREMITY MMT:  MMT Right eval Left eval Right 09/13/22 Left 09/13/22  Hip flexion   5 4+  Hip extension _0 4+  Hip abduction _1 Hip adduction _2 4+  Hip internal rotation   5 4+  Hip external rotation   4 4- L knee pain  Knee flexion 5 4+ 5 4+ Pain in lateral knee  Knee extension 5 4+ 5 5-  Ankle dorsiflexion _3 (Blank rows = not tested)   FUNCTIONAL TESTS: 08/23/22 BERG -  52/56 5XSTS - 14.92 sec   GAIT: Distance walked: 200 ft Assistive device utilized: Walker - 2 wheeled Level of assistance: Modified independence Comments: using 50% WB on LLE; also ascended/descended curb and got into his truck independently using walker.    TODAY'S TREATMENT:  09/28/22 THERAPEUTIC EXERCISE: to improve flexibility, strength and mobility.  Verbal and tactile cues throughout for technique. Rec Bike - L4 x 6 min Hooklying L quad set + SLR with slight hip ER to increase medial quad activation 3# 2 x 10 - cues to reset quad contraction before initiating  each SLR lift B hip adduction ball squeeze + L SAQ over 8" FR 3# 2 x 10 Sidelying L hip adduction 3# 2 x 10 Seated hip adduction ball squeeze isometric + LAQ 3# x 5 - deferred d/t increased patellar clunk Wall mini-squats + hip ADD ball squeeze isometric 10 x 3-5", sets L TKE with black TB x 20 L lateral step-up to 6" step + black TB TKE 2 x 10 L forward step-up to 6" step + black TB TKE 2 x 10 L/R Fitter abduction (1 black/2 blue) 2 x 10, UE support on back of chair for balance L/R Fitter extension (1 black/2 blue) 2 x 10, UE support on back of chair for balance  MANUAL THERAPY: To promote improved joint mobility. L patellar mobs - emphasis on medial glide  SELF CARE: Provided instruction in medial patellar glides   09/13/22 THERAPEUTIC EXERCISE: to improve flexibility, strength and mobility.  Verbal and tactile cues throughout for technique. Rec Bike - L3 x 6 min Standing GTB 4-way SLR x 10 bil  THERAPEUTIC ACTIVITIES: ROM & MMT 5XSTS - 14.60 sec SLS: R = 7.34 sec, L= 12.03 sec LEFS: 53 / 80 = 66.3%  GAIT TRAINING: To normalize gait pattern. STAIRS:  Level of Assistance: Modified independence  Stair Negotiation Technique: Alternating Pattern  Forwards with No Rails Single Rail on Right  Number of Stairs: 14 x 2   Height of Stairs: 7  Comments: increased effort with fwd/upward momentum of body when L  LE leading on stair ascent w/o UE support on rail, decreased hip stability and L quad control on stair descent w/o UE support on rail; more fluid reciprocal step pattern with single UE support on R railing but still demonstrates limited eccentric control   09/09/22 Therapeutic Exercise: Nustep L7x21mn Knee extension 15#, 20# x 10 each BLE Knee flexion 20# 2x10 BLE Leg press 35# 2x10 BLE TRX squats x 10  TRX side lunge L side x 10 unable to do R Eccentric step down 8' R x 10; L tap to foam roll x 10   PATIENT EDUCATION:  Education details: HEP progression - TKE advanced to black TB and instruction provided in medial patellar glides Person educated: Patient Education method: Explanation, Demonstration, and Verbal cues Education comprehension: verbalized understanding and returned demonstration   HOME EXERCISE PROGRAM: Access Code: [email or web address]: [email or web address] Date: 09/13/2022 Prepared by: JAnnie Paras Exercises - Supine Hamstring Stretch with Strap  - 2 x daily - 7 x weekly - 1 sets - 3 reps - 60 sec hold - Supine Quadriceps Stretch with Strap on Table  - 2 x daily - 7 x weekly - 1 sets - 3 reps - 30-60 sec hold - Supine Knee Extension Strengthening  - 1 x daily - 7 x weekly - 2 sets - 10 reps - Supine Active Straight Leg Raise (Mirrored)  - 2 x daily - 7 x weekly - 3 sets - 10 reps - Prone Knee Flexion AROM  - 1 x daily - 7 x weekly - 3 sets - 10 reps - Sit to Stand  - 3 x daily - 7 x weekly - 1 sets - 10 reps - Forward Step Up  - 1 x daily - 7 x weekly - 2 sets - 10 reps - Forward Step Down Touch with Toe  - 1 x daily - 7 x weekly - 2 sets - 10 reps - Sideways Step Touch  - 1 x daily - 7 x weekly -  2 sets - 10 reps - Standing Terminal Knee Extension with Resistance  - 1 x daily - 3 x weekly - 2 sets - 10 reps - 5 sec hold - Side Step Down with Counter Support  - 1 x daily - 3 x weekly - 2 sets - 10 reps - 3 sec hold - Forward Step Down with Heel Tap and  Counter Support  - 1 x daily - 3 x weekly - 2 sets - 10 reps - 3 sec hold - Standing Hip Flexion with Anchored Resistance and Chair Support  - 1 x daily - 3 x weekly - 2 sets - 10 reps - 3 sec hold - Standing Hip Abduction with Anchored Resistance  - 1 x daily - 3 x weekly - 2 sets - 10 reps - 3 sec hold - Standing Hip Extension with Anchored Resistance  - 1 x daily - 3 x weekly - 2 sets - 10 reps - 3 sec hold - Standing Hip Adduction with Anchored Resistance  - 1 x daily - 3 x weekly - 2 sets - 10 reps - 3 sec hold   ASSESSMENT:  CLINICAL IMPRESSION: Ronald Bond reports sensation of L knee "clicking" or "popping" with some motions including walking and riding the bike. Palpation of his L patella during LAQ revealing some lateral tracking with a patella clunk, as well as decreased VMO contraction appreciated. TE targeting increased medial quad activation for balanced quad pull on patella to promote improved patellar tracking, while trying to avoid patellar clunk. Delsin tolerated the exercises well but noted some increased muscle soreness by the end of session. He declined ice/vaso stating he would ice when he gets home.  OBJECTIVE IMPAIRMENTS Abnormal gait, decreased balance, decreased ROM, decreased strength, increased edema, impaired flexibility, and pain.   ACTIVITY LIMITATIONS locomotion level  PARTICIPATION LIMITATIONS: occupation  PERSONAL FACTORS  N/A  are also affecting patient's functional outcome.   REHAB POTENTIAL: Excellent  CLINICAL DECISION MAKING: Stable/uncomplicated  EVALUATION COMPLEXITY: Low   GOALS: Goals reviewed with patient? Yes   SHORT TERM GOALS: Target date: 08/17/2022    Independent with initial HEP. Baseline:  Goal status: MET  2. Balance goal set after 08/17/22. Baseline:  Goal status: MET   LONG TERM GOALS: Target date: 09/14/2022, extended to 10/11/22  Independent with advanced/ongoing HEP to improve outcomes and carryover.  Baseline:  Goal status:  IN PROGRESS  09/13/22 - Met for current HEP but updated today  2.  Ronald Bond will demonstrate left knee flexion to 120 deg to ascend/descend stairs. Baseline:  Goal status: MET  09/13/22 - L knee flexion 128; pt able to ascend/descend stairs reciprocally  3.  Ronald Bond will demonstrate full L knee extension for safety with gait. Baseline:  Goal status: PARTIALLY MET  09/13/22 - L knee extension 0 in supine but lacking 5 in seated LAQ  4.  Ronald Bond will be able to ambulate 600' safely without AD and normal gait pattern to access community.  Baseline:  Goal status: MET  09/13/22  5.  Ronald Bond will be able to ascend/descend stairs with 1 HR and reciprocal step pattern safely to access home and community.  Baseline:  Goal status: IN PROGRESS  09/13/22 - increased effort with fwd/upward momentum of body when L LE leading on stair ascent w/o UE support on rail, decreased hip stability and L quad control on stair descent w/o UE support on rail; more fluid reciprocal step pattern with single  UE support on R railing but still demonstrates limited eccentric control  6.  Ronald Bond will demonstrate >/= 55 on LEFS to demonstrate improved mobility.  Baseline: 45 Goal status: IN PROGRESS  09/13/22 - LEFS: 53 / 80 = 66.3%  7.  Ronald Bond will demonstrate improved balance by being able to perform SLS > 7 sec bil. Baseline:  Goal status: MET  09/13/22 - SLS: R = 7.34 sec, L= 12.03 sec   PLAN: PT FREQUENCY: 1x/week  PT DURATION: 4 weeks  PLANNED INTERVENTIONS: Therapeutic exercises, Therapeutic activity, Neuromuscular re-education, Balance training, Gait training, Patient/Family education, Self Care, Joint mobilization, Stair training, Aquatic Therapy, Dry Needling, Electrical stimulation, Cryotherapy, Moist heat, scar mobilization, Taping, Vasopneumatic device, Ionotophoresis 41m/ml Dexamethasone, and Manual therapy  PLAN FOR NEXT SESSION:  strengthening  for L hip/knee stability and improved patellar tracking; functional activities including stairs. Work on higher level balance.   JPercival Spanish PT 09/28/2022, 9:39 AM

## 2022-09-29 ENCOUNTER — Other Ambulatory Visit: Payer: Self-pay | Admitting: Internal Medicine

## 2022-10-04 DIAGNOSIS — T84013D Broken internal left knee prosthesis, subsequent encounter: Secondary | ICD-10-CM | POA: Diagnosis not present

## 2022-10-05 ENCOUNTER — Encounter: Payer: Self-pay | Admitting: Physical Therapy

## 2022-10-05 ENCOUNTER — Ambulatory Visit: Payer: Medicare HMO | Admitting: Physical Therapy

## 2022-10-05 DIAGNOSIS — M25662 Stiffness of left knee, not elsewhere classified: Secondary | ICD-10-CM

## 2022-10-05 DIAGNOSIS — M25562 Pain in left knee: Secondary | ICD-10-CM | POA: Diagnosis not present

## 2022-10-05 DIAGNOSIS — R2689 Other abnormalities of gait and mobility: Secondary | ICD-10-CM

## 2022-10-05 NOTE — Therapy (Signed)
OUTPATIENT PHYSICAL THERAPY TREATMENT   Patient Name: Ronald Bond MRN: [id number] DOB:12/14/1945, 76 y.o., male Today's Date: 10/05/2022        PT End of Session - 10/05/22 0846     Visit Number 10    Number of Visits 12    Date for PT Re-Evaluation 10/11/22    Authorization Type [employer] M[employer]    Progress Note Due on Visit 12   Recert on visit #8 - 36/62/94   PT Start Time 0846    PT Stop Time 0930    PT Time Calculation (min) 44 min    Activity Tolerance Patient tolerated treatment well    Behavior During Therapy [facility] for tasks assessed/performed                    Past Medical History:  Diagnosis Date   GERD (gastroesophageal reflux disease)    HTN (hypertension)    Hyperlipidemia    Hypothyroidism    Osteoarthritis    Pre-diabetes    Wears glasses    Past Surgical History:  Procedure Laterality Date   COLONOSCOPY     EYE SURGERY  2006   both cataracts   MICROLARYNGOSCOPY Left 11/18/2013   Procedure: MICROLARYNGOSCOPY WITH REMOVAL OF GRANULOMA LEFT SIDE;  Surgeon: Izora Gala, MD;  Location: [place];  Service: ENT;  Laterality: Left;   NM MYOVIEW LTD  06/2007   Low risk, normal.  No evidence of ischemia or infarction   THYROIDECTOMY  1974   TONSILLECTOMY     as a child   TOTAL KNEE ARTHROPLASTY  2009   left   TOTAL KNEE REVISION Left 07/06/2022   Procedure: TOTAL KNEE REVISION;  Surgeon: Willaim Sheng, MD;  Location: [facility];  Service: Orthopedics;  Laterality: Left;   Patient Active Problem List   Diagnosis Date Noted   Intractable hiccoughs 07/08/2022   Failed total knee arthroplasty ([place]) 07/06/2022   PAC (premature atrial contraction) 12/27/2021   Encounter for general adult medical examination with abnormal findings 06/22/2021   Bradycardia 11/30/2020   Graves' orbitopathy 07/13/2020   Type II diabetes mellitus with manifestations ([place]) 05/13/2020   Graves disease 10/07/2019   Postablative hypothyroidism 10/07/2019    Thyroid-related proptosis 08/15/2019   Chronic renal disease, stage 3, moderately decreased glomerular filtration rate (GFR) between 30-59 mL/min/1.73 square meter ([place]) 08/15/2019   Long-term current use of opiate analgesic 05/14/2018   Bilateral epiphora 11/16/2017   Thiamine deficiency 06/08/2017   Obesity (BMI 30.0-34.9) 06/04/2017   Hypertensive heart disease without CHF (congestive heart failure) 12/25/2016   Right cervical radiculopathy 06/16/2016   Iron deficiency anemia 03/08/2016   Spinal stenosis in cervical region 08/25/2015   Allergic eczema 08/07/2014   Obstructive sleep apnea 11/12/2013   Mild intermittent asthma 06/03/2013   Herniated lumbar disc without myelopathy 10/18/2012   Allergic rhinitis 03/10/2011   Cardiomegaly 10/18/2010   ERECTILE DYSFUNCTION, NON-ORGANIC, MILD 06/29/2009   Hyperlipidemia associated with type 2 diabetes mellitus ([place]) 05/28/2009   Essential hypertension, benign 05/28/2009   GERD 05/28/2009   BPH associated with nocturia 05/28/2009   Primary osteoarthritis of right knee 05/28/2009    PCP: Janith Lima, MD   REFERRING PROVIDER: Willaim Sheng, MD   REFERRING DIAG: [phone] (ICD-10-CM) - Status post revision of total knee replacement, bilateral  THERAPY DIAG:  Stiffness of left knee, not elsewhere classified  Acute pain of left knee  Other abnormalities of gait and mobility  RATIONALE FOR EVALUATION AND TREATMENT:  Rehabilitation  ONSET DATE: 07/06/22  NEXT MD VISIT: ~3 months   SUBJECTIVE:   SUBJECTIVE STATEMENT: Pt reports he feels his strength is coming back but still  having the "popping" or "cracking" at times. He saw the MD yesterday who was not alarmed by the popping and cracking. MD released him to return to work part-time but he is not sure if work can accommodate PT for the month recommended by the MD so he will probably wait until Dec to go back.  PAIN:  Are you having pain? No  PERTINENT HISTORY: HTN,  OA, prediabetic. Left TKR 2008, Revision 2023  PRECAUTIONS: None  WEIGHT BEARING RESTRICTIONS Yes 50% WBing on L until 6 weeks post-op - 08/17/22  FALLS:  Has patient fallen in last 6 months? No  LIVING ENVIRONMENT: Lives with: lives with their family Lives in: House/apartment Stairs: Yes: External: 4 steps; bilateral but cannot reach both Has following equipment at home: Single point cane and Walker - 2 wheeled  OCCUPATION: works for the [employer]  PLOF: [place] resume his normal activities, walks about a mile during his day at work   OBJECTIVE:   DIAGNOSTIC FINDINGS:  XR 07/06/22: Status post revision of total left knee arthroplasty without evidence of hardware failure.  PATIENT SURVEYS:  LEFS 45  COGNITION:  Overall cognitive status: Within functional limits for tasks assessed     SENSATION: WFL  EDEMA:  Circumferential: R knee  42 cm  L knee 46 cm  MUSCLE LENGTH: Mild tightness L HS  POSTURE: No Significant postural limitations  PALPATION: unremarkable  LOWER EXTREMITY ROM:   AROM Right eval Left  eval Left 08/12/22 Left 08/15/22 Left 09/09/22 Left 09/13/22 Right 10/05/22 Left 10/05/22  Hip flexion  WFL        Hip extension  WFL        Hip abduction  WFL        Hip adduction  WFL        Hip internal rotation          Hip external rotation  WFL        Knee flexion 117 110 128 129 125 - sitting 128 116 128  Knee extension  -3 0 0 supine -5 seated 0 - supported in sitting 0 supine -5 seated -4 0  Ankle dorsiflexion  WNL           PROM Left  eval  Hip flexion   Hip extension   Hip abduction   Hip adduction   Hip internal rotation   Hip external rotation   Knee flexion 117  Knee extension 0  Ankle dorsiflexion    (Blank rows = not tested)  LOWER EXTREMITY MMT:  MMT Right eval Left eval Right 09/13/22 Left 09/13/22  Hip flexion   5 4+  Hip extension _0 4+  Hip abduction _1 Hip adduction _2 4+   Hip internal rotation   5 4+  Hip external rotation   4 4- L knee pain  Knee flexion 5 4+ 5 4+ Pain in lateral knee  Knee extension 5 4+ 5 5-  Ankle dorsiflexion _3 (Blank rows = not tested)   FUNCTIONAL TESTS: 08/23/22 BERG - 52/56 5XSTS - 14.92 sec   GAIT: Distance walked: 200 ft Assistive device utilized: Walker - 2 wheeled Level of assistance: Modified independence Comments: using 50% WB on LLE; also ascended/descended curb and got  into his truck independently using walker.   TODAY'S TREATMENT:  10/05/22 THERAPEUTIC EXERCISE: to improve flexibility, strength and mobility.  Verbal and tactile cues throughout for technique. Rec Bike - L5 x 6 min B LE leg press 35# 2 x 10 B LE BATCA knee flexion 25# x 10, B con/L ecc 20# x 10 B LE BATCA knee extension 20# x 10, B con/L ecc 15# x 10 ITB stretch with strap 2 x 30"  SELF CARE: Verbal HEP review and consolidation, including recommendations for R LE exercises to help prepare for R TKA   09/28/22 THERAPEUTIC EXERCISE: to improve flexibility, strength and mobility.  Verbal and tactile cues throughout for technique. Rec Bike - L4 x 6 min Hooklying L quad set + SLR with slight hip ER to increase medial quad activation 3# 2 x 10 - cues to reset quad contraction before initiating each SLR lift B hip adduction ball squeeze + L SAQ over 8" FR 3# 2 x 10 Sidelying L hip adduction 3# 2 x 10 Seated hip adduction ball squeeze isometric + LAQ 3# x 5 - deferred d/t increased patellar clunk Wall mini-squats + hip ADD ball squeeze isometric 10 x 3-5", sets L TKE with black TB x 20 L lateral step-up to 6" step + black TB TKE 2 x 10 L forward step-up to 6" step + black TB TKE 2 x 10 L/R Fitter abduction (1 black/2 blue) 2 x 10, UE support on back of chair for balance L/R Fitter extension (1 black/2 blue) 2 x 10, UE support on back of chair for balance  MANUAL THERAPY: To promote improved joint mobility. L patellar mobs - emphasis on  medial glide  SELF CARE: Provided instruction in medial patellar glides   09/13/22 THERAPEUTIC EXERCISE: to improve flexibility, strength and mobility.  Verbal and tactile cues throughout for technique. Rec Bike - L3 x 6 min Standing GTB 4-way SLR x 10 bil  THERAPEUTIC ACTIVITIES: ROM & MMT 5XSTS - 14.60 sec SLS: R = 7.34 sec, L= 12.03 sec LEFS: 53 / 80 = 66.3%  GAIT TRAINING: To normalize gait pattern. Stairs:  Level of Assistance: Modified independence  Stair Negotiation Technique: Alternating Pattern  Forwards with No Rails Single Rail on Right  Number of Stairs: 14 x 2   Height of Stairs: 7  Comments: increased effort with fwd/upward momentum of body when L LE leading on stair ascent w/o UE support on rail, decreased hip stability and L quad control on stair descent w/o UE support on rail; more fluid reciprocal step pattern with single UE support on R railing but still demonstrates limited eccentric control   PATIENT EDUCATION:  Education details: HEP review, HEP consolidation, and review of appropriate gym equipment Person educated: Patient Education method: Explanation, Demonstration, and Verbal cues Education comprehension: verbalized understanding and returned demonstration   HOME EXERCISE PROGRAM: Access Code: [email or web address] URL: [email or web address] Date: 10/05/2022 Prepared by: Annie Paras  Exercises - Supine Hamstring Stretch with Strap  - 2 x daily - 7 x weekly - 1 sets - 3 reps - 60 sec hold - Supine Quadriceps Stretch with Strap on Table  - 2 x daily - 7 x weekly - 1 sets - 3 reps - 30-60 sec hold - Supine ITB Stretch with Strap  - 2 x daily - 7 x weekly - 1 sets - 3 reps - 30 sec hold - Sit to Stand  - 3 x daily - 7 x weekly - 1 sets - 10 reps -  Side Step Down with Counter Support  - 1 x daily - 3 x weekly - 2 sets - 10 reps - 3 sec hold - Forward Step Down with Heel Tap and Counter Support  - 1 x daily - 3 x weekly - 2 sets - 10 reps - 3 sec  hold - Standing Terminal Knee Extension with Resistance  - 1 x daily - 3 x weekly - 2 sets - 10 reps - 5 sec hold - Standing Hip Flexion with Anchored Resistance and Chair Support  - 1 x daily - 3 x weekly - 2 sets - 10 reps - 3 sec hold - Standing Hip Abduction with Anchored Resistance  - 1 x daily - 3 x weekly - 2 sets - 10 reps - 3 sec hold - Standing Hip Extension with Anchored Resistance  - 1 x daily - 3 x weekly - 2 sets - 10 reps - 3 sec hold - Standing Hip Adduction with Anchored Resistance  - 1 x daily - 3 x weekly - 2 sets - 10 reps - 3 sec hold   ASSESSMENT:  CLINICAL IMPRESSION: Cayetano reports the MD was pleased with his progress and has released him to return to work part-time but he will likely wait another month as he does not think his job will accommodate the part-time schedule. He indicates a plan to return to the [facility], therefore reviewed relevant gym equipment for use upon upcoming discharge from PT. HEP also reviewed and consolidated with guidance provided on exercises to also be performed for R LE to help optimize ROM, flexibility and strength preoperatively as he states he will need R TKA in the near future. Esdras reports good comfort and confidence with the HEP and should be ready to transition to the HEP following final goal assessment next visit.  OBJECTIVE IMPAIRMENTS Abnormal gait, decreased balance, decreased ROM, decreased strength, increased edema, impaired flexibility, and pain.   ACTIVITY LIMITATIONS locomotion level  PARTICIPATION LIMITATIONS: occupation  PERSONAL FACTORS  N/A  are also affecting patient's functional outcome.   REHAB POTENTIAL: Excellent  CLINICAL DECISION MAKING: Stable/uncomplicated  EVALUATION COMPLEXITY: Low   GOALS: Goals reviewed with patient? Yes   SHORT TERM GOALS: Target date: 08/17/2022    Independent with initial HEP. Baseline:  Goal status: MET  2. Balance goal set after 08/17/22. Baseline:  Goal status: MET   LONG  TERM GOALS: Target date: 09/14/2022, extended to 10/11/22  Independent with advanced/ongoing HEP to improve outcomes and carryover.  Baseline:  Goal status: IN PROGRESS  09/13/22 - Met for current HEP but updated today  2.  [name] will demonstrate left knee flexion to 120 deg to ascend/descend stairs. Baseline:  Goal status: MET  09/13/22 - L knee flexion 128; pt able to ascend/descend stairs reciprocally  3.  [name] will demonstrate full L knee extension for safety with gait. Baseline:  Goal status: PARTIALLY MET  09/13/22 - L knee extension 0 in supine but lacking 5 in seated LAQ  4.  [name] will be able to ambulate 600' safely without AD and normal gait pattern to access community.  Baseline:  Goal status: MET  09/13/22  5.  [name] will be able to ascend/descend stairs with 1 HR and reciprocal step pattern safely to access home and community.  Baseline:  Goal status: IN PROGRESS  09/13/22 - increased effort with fwd/upward momentum of body when L LE leading on stair ascent w/o UE support on  rail, decreased hip stability and L quad control on stair descent w/o UE support on rail; more fluid reciprocal step pattern with single UE support on R railing but still demonstrates limited eccentric control  6.  [name] will demonstrate >/= 55 on LEFS to demonstrate improved mobility.  Baseline: 45 Goal status: IN PROGRESS  09/13/22 - LEFS: 53 / 80 = 66.3%  7.  [name] will demonstrate improved balance by being able to perform SLS > 7 sec bil. Baseline:  Goal status: MET  09/13/22 - SLS: R = 7.34 sec, L= 12.03 sec   PLAN: PT FREQUENCY: 1x/week  PT DURATION: 4 weeks  PLANNED INTERVENTIONS: Therapeutic exercises, Therapeutic activity, Neuromuscular re-education, Balance training, Gait training, Patient/Family education, Self Care, Joint mobilization, Stair training, Aquatic Therapy, Dry Needling, Electrical stimulation,  Cryotherapy, Moist heat, scar mobilization, Taping, Vasopneumatic device, Ionotophoresis 41m/ml Dexamethasone, and Manual therapy  PLAN FOR NEXT SESSION:  goal assessment; anticipate transition to HEP with discharge vs 30-day hold   JPercival Spanish PT 10/05/2022, 9:57 AM

## 2022-10-10 ENCOUNTER — Encounter: Payer: Medicare HMO | Admitting: Physical Therapy

## 2022-10-13 ENCOUNTER — Ambulatory Visit: Payer: Medicare HMO | Admitting: Physical Therapy

## 2022-10-13 ENCOUNTER — Ambulatory Visit: Payer: Medicare HMO | Admitting: Cardiology

## 2022-10-13 ENCOUNTER — Encounter: Payer: Self-pay | Admitting: Physical Therapy

## 2022-10-13 DIAGNOSIS — M25662 Stiffness of left knee, not elsewhere classified: Secondary | ICD-10-CM

## 2022-10-13 DIAGNOSIS — R2689 Other abnormalities of gait and mobility: Secondary | ICD-10-CM | POA: Diagnosis not present

## 2022-10-13 DIAGNOSIS — M25562 Pain in left knee: Secondary | ICD-10-CM | POA: Diagnosis not present

## 2022-10-13 NOTE — Therapy (Signed)
OUTPATIENT PHYSICAL THERAPY TREATMENT / DISCHARGE SUMMARY  Patient Name: Ronald Bond MRN: [id number] DOB:1946-05-27, 76 y.o., male Today's Date: 10/13/2022     PT End of Session - 10/13/22 1616     Visit Number 11    Number of Visits 12    Date for PT Re-Evaluation 10/11/22    Authorization Type [employer] MCR    Progress Note Due on Visit 12   Recert on visit #8 - 02/58/52   PT Start Time 1616    PT Stop Time 7782    PT Time Calculation (min) 31 min    Activity Tolerance Patient tolerated treatment well    Behavior During Therapy [facility] for tasks assessed/performed                    Past Medical History:  Diagnosis Date   GERD (gastroesophageal reflux disease)    HTN (hypertension)    Hyperlipidemia    Hypothyroidism    Osteoarthritis    Pre-diabetes    Wears glasses    Past Surgical History:  Procedure Laterality Date   COLONOSCOPY     EYE SURGERY  2006   both cataracts   MICROLARYNGOSCOPY Left 11/18/2013   Procedure: MICROLARYNGOSCOPY WITH REMOVAL OF GRANULOMA LEFT SIDE;  Surgeon: Izora Gala, MD;  Location: [place];  Service: ENT;  Laterality: Left;   NM MYOVIEW LTD  06/2007   Low risk, normal.  No evidence of ischemia or infarction   THYROIDECTOMY  1974   TONSILLECTOMY     as a child   TOTAL KNEE ARTHROPLASTY  2009   left   TOTAL KNEE REVISION Left 07/06/2022   Procedure: TOTAL KNEE REVISION;  Surgeon: Willaim Sheng, MD;  [facility]: [facility];  Service: Orthopedics;  Laterality: Left;   Patient Active Problem List   Diagnosis Date Noted   Intractable hiccoughs 07/08/2022   Failed total knee arthroplasty ([place]) 07/06/2022   PAC (premature atrial contraction) 12/27/2021   Encounter for general adult medical examination with abnormal findings 06/22/2021   Bradycardia 11/30/2020   Graves' orbitopathy 07/13/2020   Type II diabetes mellitus with manifestations ([place]) 05/13/2020   Graves disease 10/07/2019   Postablative  hypothyroidism 10/07/2019   Thyroid-related proptosis 08/15/2019   Chronic renal disease, stage 3, moderately decreased glomerular filtration rate (GFR) between 30-59 mL/min/1.73 square meter ([place]) 08/15/2019   Long-term current use of opiate analgesic 05/14/2018   Bilateral epiphora 11/16/2017   Thiamine deficiency 06/08/2017   Obesity (BMI 30.0-34.9) 06/04/2017   Hypertensive heart disease without CHF (congestive heart failure) 12/25/2016   Right cervical radiculopathy 06/16/2016   Iron deficiency anemia 03/08/2016   Spinal stenosis in cervical region 08/25/2015   Allergic eczema 08/07/2014   Obstructive sleep apnea 11/12/2013   Mild intermittent asthma 06/03/2013   Herniated lumbar disc without myelopathy 10/18/2012   Allergic rhinitis 03/10/2011   Cardiomegaly 10/18/2010   ERECTILE DYSFUNCTION, NON-ORGANIC, MILD 06/29/2009   Hyperlipidemia associated with type 2 diabetes mellitus ([place]) 05/28/2009   Essential hypertension, benign 05/28/2009   GERD 05/28/2009   BPH associated with nocturia 05/28/2009   Primary osteoarthritis of right knee 05/28/2009    PCP: Janith Lima, MD   REFERRING PROVIDER: Willaim Sheng, MD   REFERRING DIAG: [phone] (ICD-10-CM) - Status post revision of total knee replacement, bilateral  THERAPY DIAG:  Stiffness of left knee, not elsewhere classified  Acute pain of left knee  Other abnormalities of gait and mobility  RATIONALE FOR EVALUATION AND TREATMENT: Rehabilitation  ONSET DATE: 07/06/22  NEXT MD VISIT: ~3 months   SUBJECTIVE:   SUBJECTIVE STATEMENT: Pt denies any pain in his L knee today but states his R knee pain has been bad enough today to where it feels like it wants to buckle at times.  PAIN:  Are you having pain? Yes: NPRS scale: 8/10 Pain location: R knee Pain description: "like the L knee was before surgery - hurting in the joint"  PERTINENT HISTORY: HTN, OA, prediabetic. Left TKR 2008, Revision  2023  PRECAUTIONS: None  WEIGHT BEARING RESTRICTIONS Yes 50% WBing on L until 6 weeks post-op - 08/17/22  FALLS:  Has patient fallen in last 6 months? No  LIVING ENVIRONMENT: Lives with: lives with their family Lives in: House/apartment Stairs: Yes: External: 4 steps; bilateral but cannot reach both Has following equipment at home: Single point cane and Walker - 2 wheeled  OCCUPATION: works for the [employer] system  PLOF: [place] resume his normal activities, walks about a mile during his day at work   OBJECTIVE:   DIAGNOSTIC FINDINGS:  XR 07/06/22: Status post revision of total left knee arthroplasty without evidence of hardware failure.  PATIENT SURVEYS:  LEFS 45  COGNITION:  Overall cognitive status: Within functional limits for tasks assessed     SENSATION: WFL  EDEMA:  Circumferential: R knee  42 cm  L knee 46 cm  MUSCLE LENGTH: Mild tightness L HS  POSTURE: No Significant postural limitations  PALPATION: unremarkable  LOWER EXTREMITY ROM:   AROM Right eval Left  eval Left 08/12/22 Left 08/15/22 Left 09/09/22 Left 09/13/22 Right 10/05/22 Left 10/05/22 Left 10/13/22  Hip flexion  WFL         Hip extension  WFL         Hip abduction  WFL         Hip adduction  WFL         Hip internal rotation           Hip external rotation  [facility]         Knee flexion 117 110 128 129 125 - sitting 128 116 128 127  Knee extension  -3 0 0 supine -5 seated 0 - supported in sitting 0 supine -5 seated -4 0 0  Ankle dorsiflexion  WNL            PROM Left  eval  Hip flexion   Hip extension   Hip abduction   Hip adduction   Hip internal rotation   Hip external rotation   Knee flexion 117  Knee extension 0  Ankle dorsiflexion    (Blank rows = not tested)  LOWER EXTREMITY MMT:  MMT Right eval Left eval Right 09/13/22 Left 09/13/22 Right 10/13/22 Left 10/13/22  Hip flexion   5 4+ 5 5  Hip extension _0 4+ 5 5  Hip abduction _1 Hip adduction _2 4+ 5 5  Hip internal rotation   5 4+ 5 5  Hip external rotation   4 4- L knee pain 5 4+  Knee flexion 5 4+ 5 4+ Pain in lateral knee 5 5  Knee extension 5 4+ 5 5- 5 5  Ankle dorsiflexion _3 (Blank rows = not tested)   FUNCTIONAL TESTS: 08/23/22 BERG - 52/56 5XSTS - 14.92 sec   GAIT: Distance walked: 200 ft Assistive device utilized: Environmental consultant - 2 wheeled Level  of assistance: Modified independence Comments: using 50% WB on LLE; also ascended/descended curb and got into his truck independently using walker.   TODAY'S TREATMENT:  10/13/22 THERAPEUTIC EXERCISE: to improve flexibility, strength and mobility.  Verbal and tactile cues throughout for technique. Rec Bike - L5 x 6 min  GAIT TRAINING: To normalize gait pattern and reduce R knee pain . Provided instruction in gait with use of SPC to help offload painful R knee Stairs: Level of Assistance: Modified independence Stair Negotiation Technique: Alternating Pattern  with Single Rail on Right Number of Stairs: 4  Height of Stairs: 7  Comments: limited by R knee pain but good control and step pattern on L  THERAPEUTIC ACTIVITIES: ROM MMT LEFS: 63 / 80 = 78.8% Goal assessment   10/05/22 THERAPEUTIC EXERCISE: to improve flexibility, strength and mobility.  Verbal and tactile cues throughout for technique. Rec Bike - L5 x 6 min B LE leg press 35# 2 x 10 B LE BATCA knee flexion 25# x 10, B con/L ecc 20# x 10 B LE BATCA knee extension 20# x 10, B con/L ecc 15# x 10 ITB stretch with strap 2 x 30"  SELF CARE: Verbal HEP review and consolidation, including recommendations for R LE exercises to help prepare for R TKA   09/28/22 THERAPEUTIC EXERCISE: to improve flexibility, strength and mobility.  Verbal and tactile cues throughout for technique. Rec Bike - L4 x 6 min Hooklying L quad set + SLR with slight hip ER to increase medial quad activation 3# 2 x 10 - cues to reset quad contraction  before initiating each SLR lift B hip adduction ball squeeze + L SAQ over 8" FR 3# 2 x 10 Sidelying L hip adduction 3# 2 x 10 Seated hip adduction ball squeeze isometric + LAQ 3# x 5 - deferred d/t increased patellar clunk Wall mini-squats + hip ADD ball squeeze isometric 10 x 3-5", sets L TKE with black TB x 20 L lateral step-up to 6" step + black TB TKE 2 x 10 L forward step-up to 6" step + black TB TKE 2 x 10 L/R Fitter abduction (1 black/2 blue) 2 x 10, UE support on back of chair for balance L/R Fitter extension (1 black/2 blue) 2 x 10, UE support on back of chair for balance  MANUAL THERAPY: To promote improved joint mobility. L patellar mobs - emphasis on medial glide  SELF CARE: Provided instruction in medial patellar glides   PATIENT EDUCATION:  Education details: recommended frequency for ongoing HEP at discharge to prevent loss of gains achieved with PT Person educated: Patient Education method: Explanation Education comprehension: verbalized understanding   HOME EXERCISE PROGRAM: Access Code: [email or web address] URL: [email or web address] Date: 10/05/2022 Prepared by: Annie Paras  Exercises - Supine Hamstring Stretch with Strap  - 2 x daily - 7 x weekly - 1 sets - 3 reps - 60 sec hold - Supine Quadriceps Stretch with Strap on Table  - 2 x daily - 7 x weekly - 1 sets - 3 reps - 30-60 sec hold - Supine ITB Stretch with Strap  - 2 x daily - 7 x weekly - 1 sets - 3 reps - 30 sec hold - Sit to Stand  - 3 x daily - 7 x weekly - 1 sets - 10 reps - Side Step Down with Counter Support  - 1 x daily - 3 x weekly - 2 sets - 10 reps - 3 sec hold - Forward Step Down with Heel Tap  and Counter Support  - 1 x daily - 3 x weekly - 2 sets - 10 reps - 3 sec hold - Standing Terminal Knee Extension with Resistance  - 1 x daily - 3 x weekly - 2 sets - 10 reps - 5 sec hold - Standing Hip Flexion with Anchored Resistance and Chair Support  - 1 x daily - 3 x weekly - 2 sets - 10 reps - 3  sec hold - Standing Hip Abduction with Anchored Resistance  - 1 x daily - 3 x weekly - 2 sets - 10 reps - 3 sec hold - Standing Hip Extension with Anchored Resistance  - 1 x daily - 3 x weekly - 2 sets - 10 reps - 3 sec hold - Standing Hip Adduction with Anchored Resistance  - 1 x daily - 3 x weekly - 2 sets - 10 reps - 3 sec hold   ASSESSMENT:  CLINICAL IMPRESSION: Landynn reports his L knee has been doing very well but his R knee is flared up today - he notes "bone-on-bone" OA in the R knee but states the surgeon wants to give his L knee more time to recover before proceeding with the R TKA. Suggested that he might want to use the [facility] in his L hand to help offload the R knee when it is acting up until he is able to proceed with the R TKA. L knee AROM remains excellent at 0-128 and overall B LE strength grossly 5/5. He denies any limitations related to the L knee at this time, but R knee pain limiting gait and stair negotiation. He feels comfortable with the HEP and gym program following the review last visit and denies need for any further review. All goals now met and Jalen feels ready to transition to his HEP, therefore will proceed with discharge from PT at this time.   OBJECTIVE IMPAIRMENTS Abnormal gait, decreased balance, decreased ROM, decreased strength, increased edema, impaired flexibility, and pain.   ACTIVITY LIMITATIONS locomotion level  PARTICIPATION LIMITATIONS: occupation  PERSONAL FACTORS  N/A  are also affecting patient's functional outcome.   REHAB POTENTIAL: Excellent  CLINICAL DECISION MAKING: Stable/uncomplicated  EVALUATION COMPLEXITY: Low   GOALS: Goals reviewed with patient? Yes   SHORT TERM GOALS: Target date: 08/17/2022    Independent with initial HEP. Baseline:  Goal status: MET  2. Balance goal set after 08/17/22. Baseline:  Goal status: MET  LONG TERM GOALS: Target date: 09/14/2022, extended to 10/11/22  Independent with advanced/ongoing HEP to  improve outcomes and carryover.  Baseline:  Goal status: MET  10/13/22  2.  [name] will demonstrate left knee flexion to 120 deg to ascend/descend stairs. Baseline:  Goal status: MET  09/13/22 - L knee flexion 128; pt able to ascend/descend stairs reciprocally  3.  [name] will demonstrate full L knee extension for safety with gait. Baseline:  Goal status:  MET  09/13/22 - L knee extension 0 in supine but lacking 5 in seated LAQ  4.  [name] will be able to ambulate 600' safely without AD and normal gait pattern to access community.  Baseline:  Goal status: MET  09/13/22  5.  [name] will be able to ascend/descend stairs with 1 HR and reciprocal step pattern safely to access home and community.  Baseline:  Goal status: MET  10/13/22 - no limitations due to L knee but increased pain with attempts to raise up leading with R LE  [place]  JOSEDE CICERO will demonstrate >/= 55 on LEFS to demonstrate improved mobility.  Baseline: 45 Goal status: MET  10/13/22 - LEFS: 63 / 80 = 78.8%  7.  [name] will demonstrate improved balance by being able to perform SLS > 7 sec bil. Baseline:  Goal status: MET  09/13/22 - SLS: R = 7.34 sec, L= 12.03 sec   PLAN: PT FREQUENCY: 1x/week  PT DURATION: 4 weeks  PLANNED INTERVENTIONS: Therapeutic exercises, Therapeutic activity, Neuromuscular re-education, Balance training, Gait training, Patient/Family education, Self Care, Joint mobilization, Stair training, Aquatic Therapy, Dry Needling, Electrical stimulation, Cryotherapy, Moist heat, scar mobilization, Taping, Vasopneumatic device, Ionotophoresis 66m/ml Dexamethasone, and Manual therapy  PLAN FOR NEXT SESSION:  transition to HEP with discharge from PT   [place] Visits from Start of Care: 11  Current functional level related to goals / functional outcomes:   Refer to above clinical impression and goal assessment.    Remaining deficits:   Antalgic gait and stair negotiation due to R knee pain from advanced OA - TKA pending in ~4-5 months.   Education / Equipment:   HEP, gait training   Patient agrees to discharge. Patient goals were met. Patient is being discharged due to meeting the stated rehab goals.  JPercival Spanish PT 10/13/2022, 6:01 PM

## 2022-10-14 ENCOUNTER — Telehealth: Payer: Medicare HMO

## 2022-10-22 ENCOUNTER — Other Ambulatory Visit: Payer: Self-pay | Admitting: Internal Medicine

## 2022-10-22 DIAGNOSIS — K2100 Gastro-esophageal reflux disease with esophagitis, without bleeding: Secondary | ICD-10-CM

## 2022-10-27 ENCOUNTER — Other Ambulatory Visit: Payer: Self-pay

## 2022-10-27 ENCOUNTER — Telehealth: Payer: Self-pay | Admitting: Internal Medicine

## 2022-10-27 DIAGNOSIS — K2100 Gastro-esophageal reflux disease with esophagitis, without bleeding: Secondary | ICD-10-CM

## 2022-10-27 MED ORDER — OMEPRAZOLE 40 MG PO CPDR: 40.0000 mg | DELAYED_RELEASE_CAPSULE | Freq: Every day | ORAL | 1 refills | Status: DC

## 2022-10-27 NOTE — Telephone Encounter (Signed)
Patient needs a refill on his omeprazole - Patient would like this to go to [facility] in [place].  Next visit:  12/26/2022

## 2022-10-27 NOTE — Telephone Encounter (Signed)
Med has been sent [place].

## 2022-11-03 ENCOUNTER — Telehealth: Payer: Self-pay | Admitting: Internal Medicine

## 2022-11-03 ENCOUNTER — Other Ambulatory Visit: Payer: Self-pay | Admitting: Internal Medicine

## 2022-11-03 DIAGNOSIS — N410 Acute prostatitis: Secondary | ICD-10-CM

## 2022-11-03 DIAGNOSIS — N401 Enlarged prostate with lower urinary tract symptoms: Secondary | ICD-10-CM

## 2022-11-03 MED ORDER — TAMSULOSIN HCL 0.4 MG PO CAPS: 0.4000 mg | ORAL_CAPSULE | Freq: Every day | ORAL | 1 refills | Status: DC

## 2022-11-03 NOTE — Telephone Encounter (Signed)
Patient called stating they need their tamsulosin (Flomax) 0.'4mg'$  refilled. They would like it sent to [facility] [address] in [facility].

## 2022-11-07 ENCOUNTER — Other Ambulatory Visit: Payer: Self-pay | Admitting: Internal Medicine

## 2022-11-07 DIAGNOSIS — I10 Essential (primary) hypertension: Secondary | ICD-10-CM

## 2022-11-07 DIAGNOSIS — E876 Hypokalemia: Secondary | ICD-10-CM

## 2022-11-13 ENCOUNTER — Other Ambulatory Visit: Payer: Self-pay | Admitting: Internal Medicine

## 2022-11-13 DIAGNOSIS — E118 Type 2 diabetes mellitus with unspecified complications: Secondary | ICD-10-CM

## 2022-11-13 DIAGNOSIS — N1832 Chronic kidney disease, stage 3b: Secondary | ICD-10-CM

## 2022-11-17 ENCOUNTER — Other Ambulatory Visit: Payer: Self-pay | Admitting: Adult Health

## 2022-11-21 ENCOUNTER — Other Ambulatory Visit: Payer: Self-pay | Admitting: Internal Medicine

## 2022-11-21 DIAGNOSIS — I10 Essential (primary) hypertension: Secondary | ICD-10-CM

## 2022-11-21 DIAGNOSIS — I119 Hypertensive heart disease without heart failure: Secondary | ICD-10-CM

## 2022-11-25 ENCOUNTER — Other Ambulatory Visit: Payer: Self-pay | Admitting: Internal Medicine

## 2022-12-01 ENCOUNTER — Encounter: Payer: Self-pay | Admitting: Internal Medicine

## 2022-12-01 ENCOUNTER — Ambulatory Visit: Payer: Medicare HMO | Admitting: Internal Medicine

## 2022-12-01 VITALS — BP 122/80 | HR 62 | Ht 70.0 in | Wt 213.0 lb

## 2022-12-01 DIAGNOSIS — E0500 Thyrotoxicosis with diffuse goiter without thyrotoxic crisis or storm: Secondary | ICD-10-CM

## 2022-12-01 DIAGNOSIS — E890 Postprocedural hypothyroidism: Secondary | ICD-10-CM | POA: Diagnosis not present

## 2022-12-01 LAB — TSH: TSH: 2.6 u[IU]/mL (ref 0.35–5.50)

## 2022-12-01 MED ORDER — LEVOTHYROXINE SODIUM 112 MCG PO TABS: 112.0000 ug | ORAL_TABLET | Freq: Every day | ORAL | 3 refills | Status: DC

## 2022-12-01 NOTE — Patient Instructions (Signed)

## 2022-12-01 NOTE — Progress Notes (Signed)
Name: Ronald Bond  MRN/ DOB: [id number], 02-20-1946    Age/ Sex: 77 y.o., male     PCP: Janith Lima, MD   Reason for Endocrinology Evaluation:  [name]' disease     Initial Endocrinology Clinic Visit: 10/07/2019    PATIENT IDENTIFIER: Ronald Bond is a 77 y.o., male with a past medical history of Graves' disease, HTN, dyslipidemia and CHF. He has followed with [facility] since 10/07/2019 for consultative assistance with management of his Graves' disease  HISTORICAL SUMMARY: The patient was first diagnosed with hyperthyroidism 1974 , requiring total thyroidectomy with benign pathology, but a couple of years later the pt was noted with hyperthyroidism again due tissue regrowth, requiring  RAI ablation , and was on LT-4 replacement until 07/2019 when this was stopped due to a low TSH 0.12 uIU/mL   But the patient restarted his L T4 replacement weeks later due to palpitation and abnormal sensation.  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  He was treated with prednisone, without any improvement in his vision.  He was unable to obtain the [place] due to shortage per patient.  He is S/P radiation therapy x3  in [place]  In 2021    He normally sees Dr. Katy Fitch locally and has an appointment to see him today    Mother with graves' disease    SUBJECTIVE:   Today (12/01/2022):  Ronald Bond is here for follow-up on Graves' disease.      He completed radiation therapy for Grave's orbitopathy in [place].  S/P right eye sx 05/2020 and 01/2021 Orbital CT 10/12/2021  showed slight increase in bilateral proptosis  Denies diplopia  Denies palpitations  Has occasional tremors a few weeks ago  Denies loose stools   Has noted right nose bleeds  for the past year   Has been taking Levothyroxine 112 mcg daily regularly HISTORY:  Past Medical History:  Past Medical History:  Diagnosis Date   GERD (gastroesophageal reflux disease)    HTN (hypertension)    Hyperlipidemia    Hypothyroidism    Osteoarthritis    Pre-diabetes    Wears glasses    Past Surgical History:  Past Surgical History:  Procedure Laterality Date   COLONOSCOPY     EYE SURGERY  2006   both cataracts   MICROLARYNGOSCOPY Left 11/18/2013   Procedure: MICROLARYNGOSCOPY WITH REMOVAL OF GRANULOMA LEFT SIDE;  Surgeon: Izora Gala, MD;  Location: [place];  Service: ENT;  Laterality: Left;   NM MYOVIEW LTD  06/2007   Low risk, normal.  No evidence of ischemia or infarction   THYROIDECTOMY  1974   TONSILLECTOMY     as a child   TOTAL KNEE ARTHROPLASTY  2009   left   TOTAL KNEE REVISION Left 07/06/2022   Procedure: TOTAL KNEE REVISION;  Surgeon: Willaim Sheng, MD;  [facility];  Service: Orthopedics;  Laterality: Left;   Social History:  reports that he has never smoked. He has never used smokeless tobacco. He reports current alcohol use. He reports that he does not use drugs.  Family History:  Family History  Problem Relation Age of Onset   Brain cancer Mother    Heart disease Father    Heart disease Brother      HOME MEDICATIONS: Allergies as of 12/01/2022       Reactions   Lisinopril Other (See Comments)   Edema in face        Medication List        Accurate as of December 01, 2022 10:36 AM. If you have any questions, ask your nurse or doctor.          albuterol 108 (90 Base) MCG/ACT inhaler Commonly known as: VENTOLIN HFA Inhale 2 puffs into the lungs every 6 (six) hours as needed for wheezing or shortness of breath.   amLODipine 10  MG tablet Commonly known as: NORVASC Take 1 tablet (10 mg total) by mouth daily.   apixaban 2.5 MG Tabs tablet Commonly known as: Eliquis Take 1 tablet (2.5 mg total) by mouth 2 (two) times daily.   ascorbic acid 500 MG tablet Commonly known as: VITAMIN C Take 500 mg by mouth daily.   atorvastatin 40 MG tablet Commonly known as: LIPITOR TAKE 1 TABLET BY MOUTH EVERY DAY   Blood Pressure Monitor/Arm Devi 1 Device by Does not apply route daily. USE DAILY TO  TAKE BLOOD PRESSURE READINGS   cetirizine 10 MG tablet Commonly known as: ZYRTEC Take 10 mg by mouth daily.   chlorproMAZINE 25 MG tablet Commonly known as: THORAZINE Take 1 tablet (25 mg total) by mouth 4 (four) times daily as needed.   cholecalciferol 25 MCG (1000 UNIT) tablet Commonly known as: VITAMIN D3 Take 1,000 Units by mouth daily.   docusate sodium 100 MG capsule Commonly known as: COLACE Take 100 mg by mouth 2 (two) times daily.   Farxiga 10 MG Tabs tablet Generic drug: dapagliflozin propanediol TAKE 1 TABLET BY MOUTH EVERY DAY BEFORE BREAKFAST   fluticasone 50 MCG/ACT nasal spray Commonly known as: FLONASE Place 2 sprays into both nostrils daily. What changed:  when to take this reasons to take this   hydrALAZINE 25 MG tablet Commonly known as: APRESOLINE TAKE 1 TABLET BY MOUTH THREE TIMES A DAY   indapamide 1.25 MG tablet Commonly known as: LOZOL TAKE 1 TABLET BY MOUTH EVERY DAY   Klor-Con M20 20 MEQ tablet Generic drug: potassium chloride SA TAKE 1 TABLET BY MOUTH TWICE A DAY   levothyroxine 112 MCG tablet Commonly known as: SYNTHROID Take 1 tablet (112 mcg total) by mouth daily.   meloxicam 7.5 MG tablet Commonly known as: MOBIC TAKE 1 TABLET BY MOUTH EVERY DAY   metoprolol tartrate 25 MG tablet Commonly known as: LOPRESSOR Take 0.5 tablets (12.5 mg total) by mouth 2 (two) times daily.   multivitamin tablet Take 1 tablet by mouth daily.   omeprazole 40 MG capsule Commonly known  as: PRILOSEC Take 1 capsule (40 mg total) by mouth daily.   Polyethyl Glycol-Propyl Glycol 0.4-0.3 % Soln Place 1 drop into both eyes as needed (dry eyes).   tamsulosin 0.4 MG Caps capsule Commonly known as: FLOMAX Take 1 capsule (0.4 mg total) by mouth daily after breakfast.   TURMERIC CURCUMIN PO Take 1 tablet by mouth daily.   valsartan 320 MG tablet Commonly known as: DIOVAN TAKE 1 TABLET BY MOUTH EVERY DAY   ZINC PO Take 1 tablet by mouth daily.          OBJECTIVE:   PHYSICAL EXAM: VS: BP 122/80 (BP Location: Left  Arm, Patient Position: Sitting, Cuff Size: Large)   Pulse 62   Ht '5\' 10"'$  (1.778 m)   Wt 213 lb (96.6 kg)   SpO2 95%   BMI 30.56 kg/m    EXAM: General: Pt appears well and is in NAD  Eyes: External eye exam shows improved bilateral  proptosis  Neck: General: Supple without adenopathy. Thyroid: No goiter or nodules appreciated.   Lungs: Clear with good BS bilat with no rales, rhonchi, or wheezes  Heart: Auscultation: RRR.  Extremities:  BL LE: No pretibial edema normal ROM and strength.  Mental Status: Judgment, insight: Intact Memory: Intact for recent and remote events Mood and affect: No depression, anxiety, or agitation     DATA REVIEWED:   Latest Reference Range & Units 12/01/22 10:50  TSH 0.35 - 5.50 uIU/mL 2.60    07/2019 TSI : 474 (H)      ASSESSMENT / PLAN / RECOMMENDATIONS:   Graves' disease, status post thyroidectomy followed by radioactive iodine ablation:  -The patient is clinically euthyroid - No local neck symptoms  -TSH normal, no change     Medications   Continue Levothyroxine 112 MCG daily  2. Graves' Orbitopathy :    - S/P radiation therapy, prednisone therapy and recently sx on the right eye - Diplopia resolved - Continues to follow up at [facility]    3. Epistaxis:  - this is intermittent over the past year, most likely dry air  - Pt to use saline nose spray 2-3x a day  PRN    Follow-up in 1 yr        Signed electronically by: Mack Guise, MD  [facility]  [facility] [address] [place], [place] [address] Phone: [phone] FAX: [phone]      CC: Janith Lima, [place] [address] Phone: [phone]  Fax: [phone]   Return to Endocrinology clinic as below: Future Appointments  Date Time Provider [place]  12/26/2022  1:45 PM [place] ADVISOR [facility] None  12/28/2022  8:40 AM Janith Lima, MD [facility] None

## 2022-12-05 LAB — THYROID STIMULATING IMMUNOGLOBULIN: TSI: 534 % baseline — ABNORMAL HIGH (ref <140)

## 2022-12-19 ENCOUNTER — Other Ambulatory Visit: Payer: Self-pay | Admitting: Internal Medicine

## 2022-12-19 DIAGNOSIS — N1832 Chronic kidney disease, stage 3b: Secondary | ICD-10-CM

## 2022-12-19 DIAGNOSIS — E118 Type 2 diabetes mellitus with unspecified complications: Secondary | ICD-10-CM

## 2022-12-20 ENCOUNTER — Other Ambulatory Visit: Payer: Self-pay | Admitting: Internal Medicine

## 2022-12-20 DIAGNOSIS — I119 Hypertensive heart disease without heart failure: Secondary | ICD-10-CM

## 2022-12-20 MED ORDER — INDAPAMIDE 1.25 MG PO TABS: 1.2500 mg | ORAL_TABLET | Freq: Every day | ORAL | 0 refills | Status: DC

## 2022-12-24 ENCOUNTER — Other Ambulatory Visit: Payer: Self-pay | Admitting: Internal Medicine

## 2022-12-24 DIAGNOSIS — I10 Essential (primary) hypertension: Secondary | ICD-10-CM

## 2022-12-24 DIAGNOSIS — I119 Hypertensive heart disease without heart failure: Secondary | ICD-10-CM

## 2022-12-26 ENCOUNTER — Other Ambulatory Visit: Payer: Self-pay | Admitting: Internal Medicine

## 2022-12-26 ENCOUNTER — Ambulatory Visit (INDEPENDENT_AMBULATORY_CARE_PROVIDER_SITE_OTHER): Payer: Medicare HMO

## 2022-12-26 VITALS — BP 136/74 | HR 61 | Temp 97.9°F | Ht 70.0 in | Wt 214.0 lb

## 2022-12-26 DIAGNOSIS — E785 Hyperlipidemia, unspecified: Secondary | ICD-10-CM

## 2022-12-26 DIAGNOSIS — Z0000 Encounter for general adult medical examination without abnormal findings: Secondary | ICD-10-CM

## 2022-12-26 NOTE — Patient Instructions (Signed)
It was great speaking with you today!  Please schedule your next Medicare Wellness Visit with your Nurse Health Advisor in 1 year by calling [phone]. 

## 2022-12-26 NOTE — Progress Notes (Signed)
Subjective:   BRAYLAN FAUL is a 77 y.o. male who presents for Medicare Annual/Subsequent preventive examination.  Review of Systems    No ROS. Medicare Wellness Visit. Additional risk factors are reflected in social history. Cardiac Risk Factors include: advanced age (>58mn, >>61women);diabetes mellitus;dyslipidemia;male gender;hypertension;obesity (BMI >30kg/m2)     Objective:    Today's Vitals   12/26/22 1348 12/26/22 1404  BP: (!) 144/70 136/74  Pulse: 61   Temp: 97.9 F (36.6 C)   TempSrc: Temporal   SpO2: 98%   Weight: 214 lb (97.1 kg)   Height: '5\' 10"'$  (1.778 m)    Body mass index is 30.71 kg/m.     12/26/2022    2:20 PM 08/03/2022    9:41 AM 07/06/2022    2:58 PM 06/23/2022   10:20 AM 12/24/2021    1:39 PM 09/18/2020   11:49 AM 12/07/2018    9:35 AM  Advanced Directives  Does Patient Have a Medical Advance Directive? Yes Yes No No No No No  Type of Advance Directive Living will;[place]care [place]       Does patient want to make changes to medical advance directive? No - Patient declined No - Patient declined       Copy of [place] Chart? No - copy requested No - copy requested       Would patient like information on creating a medical advance directive?   No - Patient declined No - Patient declined Yes (MAU/Ambulatory/Procedural Areas - Information given) Yes (MAU/Ambulatory/Procedural Areas - Information given) Yes (ED - Information included in AVS)    Current Medications (verified) Outpatient Encounter Medications as of 12/26/2022  Medication Sig   albuterol (VENTOLIN HFA) 108 (90 Base) MCG/ACT inhaler Inhale 2 puffs into the lungs every 6 (six) hours as needed for wheezing or shortness of breath.   amLODipine (NORVASC) 10 MG tablet Take 1 tablet (10 mg total) by mouth daily.   apixaban (ELIQUIS) 2.5 MG TABS tablet Take 1 tablet (2.5 mg total) by mouth 2 (two) times daily.   atorvastatin  (LIPITOR) 40 MG tablet TAKE 1 TABLET BY MOUTH EVERY DAY   Blood Pressure Monitoring (BLOOD PRESSURE MONITOR/ARM) DEVI 1 Device by Does not apply route daily. USE DAILY TO  TAKE BLOOD PRESSURE READINGS   cetirizine (ZYRTEC) 10 MG tablet Take 10 mg by mouth daily.   chlorproMAZINE (THORAZINE) 25 MG tablet Take 1 tablet (25 mg total) by mouth 4 (four) times daily as needed.   cholecalciferol (VITAMIN D3) 25 MCG (1000 UNIT) tablet Take 1,000 Units by mouth daily.   docusate sodium (COLACE) 100 MG capsule Take 100 mg by mouth 2 (two) times daily.   FARXIGA 10 MG TABS tablet TAKE 1 TABLET BY MOUTH EVERY DAY BEFORE BREAKFAST   fluticasone (FLONASE) 50 MCG/ACT nasal spray Place 2 sprays into both nostrils daily. (Patient taking differently: Place 2 sprays into both nostrils daily as needed.)   hydrALAZINE (APRESOLINE) 25 MG tablet TAKE 1 TABLET BY MOUTH THREE TIMES A DAY   indapamide (LOZOL) 1.25 MG tablet Take 1 tablet (1.25 mg total) by mouth daily.   KLOR-CON M20 20 MEQ tablet TAKE 1 TABLET BY MOUTH TWICE A DAY   levothyroxine (SYNTHROID) 112 MCG tablet Take 1 tablet (112 mcg total) by mouth daily.   meloxicam (MOBIC) 7.5 MG tablet TAKE 1 TABLET BY MOUTH EVERY DAY   metoprolol tartrate (LOPRESSOR) 25 MG tablet Take 0.5 tablets (12.5  mg total) by mouth 2 (two) times daily.   Multiple Vitamin (MULTIVITAMIN) tablet Take 1 tablet by mouth daily.   Multiple Vitamins-Minerals (ZINC PO) Take 1 tablet by mouth daily.   omeprazole (PRILOSEC) 40 MG capsule Take 1 capsule (40 mg total) by mouth daily.   Polyethyl Glycol-Propyl Glycol 0.4-0.3 % SOLN Place 1 drop into both eyes as needed (dry eyes).   tamsulosin (FLOMAX) 0.4 MG CAPS capsule Take 1 capsule (0.4 mg total) by mouth daily after breakfast.   TURMERIC CURCUMIN PO Take 1 tablet by mouth daily.   valsartan (DIOVAN) 320 MG tablet TAKE 1 TABLET BY MOUTH EVERY DAY   vitamin C (ASCORBIC ACID) 500 MG tablet Take 500 mg by mouth daily.   No  facility-administered encounter medications on file as of 12/26/2022.    Allergies (verified) Lisinopril   History: Past Medical History:  Diagnosis Date   GERD (gastroesophageal reflux disease)    HTN (hypertension)    Hyperlipidemia    Hypothyroidism    Osteoarthritis    Pre-diabetes    Wears glasses    Past Surgical History:  Procedure Laterality Date   COLONOSCOPY     EYE SURGERY  2006   both cataracts   MICROLARYNGOSCOPY Left 11/18/2013   Procedure: MICROLARYNGOSCOPY WITH REMOVAL OF GRANULOMA LEFT SIDE;  Surgeon: Izora Gala, MD;  Location: [facility];  Service: ENT;  Laterality: Left;   NM MYOVIEW LTD  06/2007   Low risk, normal.  No evidence of ischemia or infarction   THYROIDECTOMY  1974   TONSILLECTOMY     as a child   TOTAL KNEE ARTHROPLASTY  2009   left   TOTAL KNEE REVISION Left 07/06/2022   Procedure: TOTAL KNEE REVISION;  Surgeon: Willaim Sheng, MD;  Location: [facility];  Service: Orthopedics;  Laterality: Left;   Family History  Problem Relation Age of Onset   Brain cancer Mother    Heart disease Father    Heart disease Brother    Social History   Socioeconomic History   Marital status: Married    Spouse name: Not on file   Number of children: 2   Years of education: Not on file   Highest education level: Not on file  Occupational History   Occupation: railroad  Tobacco Use   Smoking status: Never   Smokeless tobacco: Never  Vaping Use   Vaping Use: Never used  Substance and Sexual Activity   Alcohol use: Yes    Comment: occassionally   Drug use: No   Sexual activity: Yes  Other Topics Concern   Not on file  Social History Narrative   Regular Exercise -  YES      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  He states that he enjoys  the job a lot does a lot of walking and climbing stairs  without significant discomfort.      He usually works ~2 weeks @ a time - stays in [employer] (paid for by [employer]) - & comes home for ~4 d weekends.   Social Determinants of Health   Financial Resource Strain: Low Risk  (12/26/2022)   Overall Financial Resource Strain (CARDIA)    Difficulty of Paying Living Expenses: Not hard at all  Food Insecurity: No Food Insecurity (12/26/2022)   Hunger Vital Sign  Worried About Charity fundraiser in the Last Year: Never true    [place] in the Last Year: Never true  Transportation Needs: No Transportation Needs (12/26/2022)   PRAPARE - Hydrologist (Medical): No    Lack of Transportation (Non-Medical): No  Physical Activity: Insufficiently Active (12/26/2022)   Exercise Vital Sign    Days of Exercise per Week: 7 days    Minutes of Exercise per Session: 10 min  Stress: No Stress Concern Present (12/26/2022)   [place]    Feeling of Stress : Not at all  Social Connections: Moderately Isolated (12/26/2022)   Social Connection and Isolation Panel [NHANES]    Frequency of Communication with Friends and Family: More than three times a week    Frequency of Social Gatherings with Friends and Family: More than three times a week    Attends Religious Services: Never    Marine scientist or Organizations: No    Attends Music therapist: Never    Marital Status: Married    Tobacco Counseling Counseling given: Not Answered   Clinical Intake:  Pre-visit preparation completed: Yes  Pain : No/denies pain     BMI - recorded: 30 Nutritional Status: BMI > 30  Obese Nutritional Risks: None Diabetes: Yes (prediabetes per pt) CBG done?: No Did pt. bring in CBG monitor from home?: No does not check blood sugars  How often do you need to have someone help you when you read instructions, pamphlets, or other written materials  from your doctor or pharmacy?: 1 - Never What is the last grade level you completed in school?: 12th grade   Interpreter Needed?: No  Information entered by :: Jillene Bucks, [place]   Activities of Daily Living    12/26/2022    2:21 PM 07/06/2022    2:58 PM  In your present state of health, do you have any difficulty performing the following activities:  Hearing? 0 0  Vision? 0 0  Difficulty concentrating or making decisions? 1 0  Comment sometimes   Walking or climbing stairs? 0 0  Dressing or bathing? 0 0  Doing errands, shopping? 0 0  Preparing Food and eating ? N   Using the Toilet? N   In the past six months, have you accidently leaked urine? Y   Do you have problems with loss of bowel control? N   Managing your Medications? N   Managing your Finances? N   Housekeeping or managing your Housekeeping? N     Patient Care Team: Janith Lima, MD as PCP - General Ellyn Hack Leonie Green, MD as PCP - Cardiology (Cardiology) Franchot Gallo, MD as Consulting Physician (Urology) Deneise Lever, MD as Consulting Physician (Pulmonary Disease) Glory Rosebush, MD as Consulting Physician ([facility]) Juanita Craver, MD as Consulting Physician (Gastroenterology)  Indicate any recent Medical Services you may have received from other than [facility] providers in the past year (date may be approximate).     Assessment:   This is a routine wellness examination for [place].  Hearing/Vision screen Patient denied any hearing difficulty. No hearing aids. Patient wears corrective lenses.   Dietary issues and exercise activities discussed: Current Exercise Habits: Home exercise routine, Type of exercise: walking, Time (Minutes): 10, Frequency (Times/Week): 7, Weekly Exercise (Minutes/Week): 70, Intensity: Mild, Exercise limited by: None identified   Goals Addressed  This Visit's Progress    Patient Stated       To continue to stay healthy.       Depression  Screen    12/26/2022    2:20 PM 12/24/2021    1:38 PM 06/21/2021    9:16 AM 09/18/2020   11:47 AM 05/12/2020   10:35 AM 12/07/2018    9:41 AM 10/15/2018    4:35 PM  PHQ 2/9 Scores  PHQ - 2 Score 0 0 0 0 0 0 0    Fall Risk    12/26/2022    2:20 PM 12/24/2021    1:43 PM 06/21/2021    9:16 AM 09/18/2020   11:49 AM 05/12/2020   10:35 AM  Fall Risk   Falls in the past year? 1 0 0 0 0  Number falls in past yr: 0 0 0 0 0  Injury with Fall? 0 0 0 0 0  Risk for fall due to : No Fall Risks No Fall Risks  No Fall Risks No Fall Risks  Follow up Falls evaluation completed Falls evaluation completed  Falls evaluation completed;Education provided Falls evaluation completed    [place]:  Any stairs in or around the home? Yes  If so, are there any without handrails? No  Home free of loose throw rugs in walkways, pet beds, electrical cords, etc? Yes  Adequate lighting in your home to reduce risk of falls? Yes   ASSISTIVE DEVICES UTILIZED TO PREVENT FALLS:  Life alert? Yes Alexa Use of a cane, walker or w/c? No  Grab bars in the bathroom? Yes  Shower chair or bench in shower? Yes  Elevated toilet seat or a handicapped toilet? Yes   TIMED UP AND GO:  Was the test performed? No .  Length of time to ambulate 10 feet: N/A sec.   Patient stated that he has no issues with gait or balance; does not use any assistive devices.  Cognitive Function: Patient is cogitatively intact.    11/30/2017    8:36 AM  MMSE - Mini Mental State Exam  Orientation to time 5  Orientation to Place 5  Registration 3  Attention/ Calculation 5  Recall 1  Language- name 2 objects 2  Language- repeat 1  Language- follow 3 step command 3  Language- read & follow direction 1  Write a sentence 1  Copy design 1  Total score 28        12/26/2022    2:22 PM 09/18/2020   11:50 AM  6CIT Screen  What Year? 0 points 0 points  What month? 0 points 0 points  What time? 0 points 0  points  Count back from 20 0 points 0 points  Months in reverse 0 points 0 points  Repeat phrase 0 points 0 points  Total Score 0 points 0 points    Immunizations Immunization History  Administered Date(s) Administered   Fluad Quad(high Dose 65+) 07/27/2019, 08/12/2022   Influenza Split 10/24/2011   Influenza Whole 08/19/2009, 07/23/2010   Influenza, High Dose Seasonal PF 10/21/2013, 09/27/2016, 09/07/2017, 09/14/2018, 08/18/2021   Influenza,inj,Quad PF,6+ Mos 08/07/2014, 08/25/2015   Influenza-Unspecified 08/28/2020, 08/18/2021   PFIZER Comirnaty(Gray Top)Covid-19 Tri-Sucrose Vaccine 04/17/2021   PFIZER(Purple Top)SARS-COV-2 Vaccination 01/27/2020, 02/25/2020, 08/28/2020   Pfizer Covid-19 Vaccine Bivalent Booster 61yr & up 09/06/2021   Pneumococcal Conjugate-13 08/25/2015   Pneumococcal Polysaccharide-23 11/28/2004, 03/08/2016   Td 11/28/2004   Tdap 08/25/2015   Zoster Recombinat (Shingrix) 08/23/2019   Zoster, Live  02/24/2011    TDAP status: Up to date  Flu Vaccine status: Up to date  Pneumococcal vaccine status: Up to date  Covid-19 vaccine status: Completed vaccines  Qualifies for Shingles Vaccine? Yes   Zostavax completed No   Shingrix Completed?: No.    Education has been provided regarding the importance of this vaccine. Patient has been advised to call insurance company to determine out of pocket expense if they have not yet received this vaccine. Advised may also receive vaccine at local pharmacy or Health Dept. Verbalized acceptance and understanding.  Screening Tests Health Maintenance  Topic Date Due   Zoster Vaccines- Shingrix (2 of 2) 10/18/2019   FOOT EXAM  11/30/2021   Diabetic kidney evaluation - Urine ACR  10/28/2022   HEMOGLOBIN A1C  12/07/2022   OPHTHALMOLOGY EXAM  12/26/2022 (Originally 10/07/2022)   COVID-19 Vaccine (6 - 2023-24 season) 01/11/2023 (Originally 07/29/2022)   Diabetic kidney evaluation - eGFR measurement  07/08/2023   Medicare Annual  Wellness (AWV)  12/27/2023   DTaP/Tdap/Td (3 - Td or Tdap) 08/24/2025   Pneumonia Vaccine 26+ Years old  Completed   INFLUENZA VACCINE  Completed   Hepatitis C Screening  Completed   HPV VACCINES  Aged Out   Fecal DNA (Cologuard)  Discontinued    Health Maintenance  Health Maintenance Due  Topic Date Due   Zoster Vaccines- Shingrix (2 of 2) 10/18/2019   FOOT EXAM  11/30/2021   Diabetic kidney evaluation - Urine ACR  10/28/2022   HEMOGLOBIN A1C  12/07/2022    Colorectal cancer screening: Type of screening: Cologuard. Completed 03/21/2018. Repeat every N/A years  Lung Cancer Screening: (Low Dose CT Chest recommended if Age 13-80 years, 30 pack-year currently smoking OR have quit w/in 15years.) does not qualify.   Lung Cancer Screening Referral: N/A  Additional Screening:  Hepatitis C Screening: does qualify; Completed 09/27/2016  Vision Screening: Recommended annual ophthalmology exams for early detection of glaucoma and other disorders of the eye. Is the patient up to date with their annual eye exam?  Yes  Who is the provider or what is the name of the office in which the patient attends annual eye exams? Goes to [facility] in [place] but sees Dr. Katy Fitch in [place]  If pt is not established with a provider, would they like to be referred to a provider to establish care? No .   Dental Screening: Recommended annual dental exams for proper oral hygiene  Community Resource Referral / Chronic Care Management: CRR required this visit?  No   CCM required this visit?  No      Plan:     I have personally reviewed and noted the following in the patient's chart:   Medical and social history Use of alcohol, tobacco or illicit drugs  Current medications and supplements including opioid prescriptions. Patient is not currently taking opioid prescriptions. Functional ability and status Nutritional status Physical activity Advanced directives List of other  physicians Hospitalizations, surgeries, and ER visits in previous 12 months Vitals Screenings to include cognitive, depression, and falls Referrals and appointments  In addition, I have reviewed and discussed with patient certain preventive protocols, quality metrics, and best practice recommendations. A written personalized care plan for preventive services as well as general preventive health recommendations were provided to patient.     Rossie Muskrat, [place]   12/26/2022   Nurse Notes:  Requesting records for ophthalmology exam

## 2022-12-28 ENCOUNTER — Encounter: Payer: Self-pay | Admitting: Internal Medicine

## 2022-12-28 ENCOUNTER — Ambulatory Visit (INDEPENDENT_AMBULATORY_CARE_PROVIDER_SITE_OTHER): Payer: Medicare HMO | Admitting: Internal Medicine

## 2022-12-28 VITALS — BP 136/78 | HR 45 | Temp 98.1°F | Resp 16 | Ht 70.0 in | Wt 212.0 lb

## 2022-12-28 DIAGNOSIS — N401 Enlarged prostate with lower urinary tract symptoms: Secondary | ICD-10-CM

## 2022-12-28 DIAGNOSIS — N1832 Chronic kidney disease, stage 3b: Secondary | ICD-10-CM | POA: Diagnosis not present

## 2022-12-28 DIAGNOSIS — E519 Thiamine deficiency, unspecified: Secondary | ICD-10-CM

## 2022-12-28 DIAGNOSIS — Z23 Encounter for immunization: Secondary | ICD-10-CM | POA: Diagnosis not present

## 2022-12-28 DIAGNOSIS — D508 Other iron deficiency anemias: Secondary | ICD-10-CM | POA: Diagnosis not present

## 2022-12-28 DIAGNOSIS — R351 Nocturia: Secondary | ICD-10-CM

## 2022-12-28 DIAGNOSIS — I10 Essential (primary) hypertension: Secondary | ICD-10-CM

## 2022-12-28 DIAGNOSIS — Z0001 Encounter for general adult medical examination with abnormal findings: Secondary | ICD-10-CM | POA: Diagnosis not present

## 2022-12-28 DIAGNOSIS — E118 Type 2 diabetes mellitus with unspecified complications: Secondary | ICD-10-CM | POA: Diagnosis not present

## 2022-12-28 LAB — URINALYSIS, ROUTINE W REFLEX MICROSCOPIC
Bilirubin Urine: NEGATIVE
Hgb urine dipstick: NEGATIVE
Ketones, ur: NEGATIVE
Leukocytes,Ua: NEGATIVE
Nitrite: NEGATIVE
RBC / HPF: NONE SEEN (ref 0–?)
Specific Gravity, Urine: 1.02 (ref 1.000–1.030)
Total Protein, Urine: NEGATIVE
Urine Glucose: >=1000 — AB
Urobilinogen, UA: 0.2 (ref 0.0–1.0)
pH: 6 (ref 5.0–8.0)

## 2022-12-28 LAB — BASIC METABOLIC PANEL
BUN: 23 mg/dL (ref 6–23)
CO2: 28 mEq/L (ref 19–32)
Calcium: 9.6 mg/dL (ref 8.4–10.5)
Chloride: 100 mEq/L (ref 96–112)
Creatinine, Ser: 1.75 mg/dL — ABNORMAL HIGH (ref 0.40–1.50)
GFR: 37.2 mL/min — ABNORMAL LOW (ref >60.00)
Glucose, Bld: 90 mg/dL (ref 70–99)
Potassium: 4.6 mEq/L (ref 3.5–5.1)
Sodium: 137 mEq/L (ref 135–145)

## 2022-12-28 LAB — CBC WITH DIFFERENTIAL/PLATELET
Basophils Absolute: 0.1 10*3/uL (ref 0.0–0.1)
Basophils Relative: 0.8 % (ref 0.0–3.0)
Eosinophils Absolute: 0.3 10*3/uL (ref 0.0–0.7)
Eosinophils Relative: 3.6 % (ref 0.0–5.0)
HCT: 36.6 % — ABNORMAL LOW (ref 39.0–52.0)
Hemoglobin: 12.5 g/dL — ABNORMAL LOW (ref 13.0–17.0)
Lymphocytes Relative: 14 % (ref 12.0–46.0)
Lymphs Abs: 1.1 10*3/uL (ref 0.7–4.0)
MCHC: 34.1 g/dL (ref 30.0–36.0)
MCV: 87.8 fl (ref 78.0–100.0)
Monocytes Absolute: 0.8 10*3/uL (ref 0.1–1.0)
Monocytes Relative: 10.5 % (ref 3.0–12.0)
Neutro Abs: 5.6 10*3/uL (ref 1.4–7.7)
Neutrophils Relative %: 71.1 % (ref 43.0–77.0)
Platelets: 293 10*3/uL (ref 150.0–400.0)
RBC: 4.17 Mil/uL — ABNORMAL LOW (ref 4.22–5.81)
RDW: 15.2 % (ref 11.5–15.5)
WBC: 7.8 10*3/uL (ref 4.0–10.5)

## 2022-12-28 LAB — MICROALBUMIN / CREATININE URINE RATIO
Creatinine,U: 108.8 mg/dL
Microalb Creat Ratio: 4.2 mg/g (ref 0.0–30.0)
Microalb, Ur: 4.6 mg/dL — ABNORMAL HIGH (ref 0.0–1.9)

## 2022-12-28 LAB — IBC + FERRITIN
Ferritin: 16.4 ng/mL — ABNORMAL LOW (ref 22.0–322.0)
Iron: 56 ug/dL (ref 42–165)
Saturation Ratios: 13.9 % — ABNORMAL LOW (ref 20.0–50.0)
TIBC: 403.2 ug/dL (ref 250.0–450.0)
Transferrin: 288 mg/dL (ref 212.0–360.0)

## 2022-12-28 LAB — HEMOGLOBIN A1C: Hgb A1c MFr Bld: 6.2 % (ref 4.6–6.5)

## 2022-12-28 LAB — PSA: PSA: 3.58 ng/mL (ref 0.10–4.00)

## 2022-12-28 NOTE — Patient Instructions (Signed)
Health Maintenance, Male Adopting a healthy lifestyle and getting preventive care are important in promoting health and wellness. Ask your health care provider about: The right schedule for you to have regular tests and exams. Things you can do on your own to prevent diseases and keep yourself healthy. What should I know about diet, weight, and exercise? Eat a healthy diet  Eat a diet that includes plenty of vegetables, fruits, low-fat dairy products, and lean protein. Do not eat a lot of foods that are high in solid fats, added sugars, or sodium. Maintain a healthy weight Body mass index (BMI) is a measurement that can be used to identify possible weight problems. It estimates body fat based on height and weight. Your health care provider can help determine your BMI and help you achieve or maintain a healthy weight. Get regular exercise Get regular exercise. This is one of the most important things you can do for your health. Most adults should: Exercise for at least 150 minutes each week. The exercise should increase your heart rate and make you sweat (moderate-intensity exercise). Do strengthening exercises at least twice a week. This is in addition to the moderate-intensity exercise. Spend less time sitting. Even light physical activity can be beneficial. Watch cholesterol and blood lipids Have your blood tested for lipids and cholesterol at 77 years of age, then have this test every 5 years. You may need to have your cholesterol levels checked more often if: Your lipid or cholesterol levels are high. You are older than 77 years of age. You are at high risk for heart disease. What should I know about cancer screening? Many types of cancers can be detected early and may often be prevented. Depending on your health history and family history, you may need to have cancer screening at various ages. This may include screening for: Colorectal cancer. Prostate cancer. Skin cancer. Lung  cancer. What should I know about heart disease, diabetes, and high blood pressure? Blood pressure and heart disease High blood pressure causes heart disease and increases the risk of stroke. This is more likely to develop in people who have high blood pressure readings or are overweight. Talk with your health care provider about your target blood pressure readings. Have your blood pressure checked: Every 3-5 years if you are 18-39 years of age. Every year if you are 40 years old or older. If you are between the ages of 65 and 75 and are a current or former smoker, ask your health care provider if you should have a one-time screening for abdominal aortic aneurysm (AAA). Diabetes Have regular diabetes screenings. This checks your fasting blood sugar level. Have the screening done: Once every three years after age 45 if you are at a normal weight and have a low risk for diabetes. More often and at a younger age if you are overweight or have a high risk for diabetes. What should I know about preventing infection? Hepatitis B If you have a higher risk for hepatitis B, you should be screened for this virus. Talk with your health care provider to find out if you are at risk for hepatitis B infection. Hepatitis C Blood testing is recommended for: Everyone born from 1945 through 1965. Anyone with known risk factors for hepatitis C. Sexually transmitted infections (STIs) You should be screened each year for STIs, including gonorrhea and chlamydia, if: You are sexually active and are younger than 77 years of age. You are older than 77 years of age and your   health care provider tells you that you are at risk for this type of infection. Your sexual activity has changed since you were last screened, and you are at increased risk for chlamydia or gonorrhea. Ask your health care provider if you are at risk. Ask your health care provider about whether you are at high risk for HIV. Your health care provider  may recommend a prescription medicine to help prevent HIV infection. If you choose to take medicine to prevent HIV, you should first get tested for HIV. You should then be tested every 3 months for as long as you are taking the medicine. Follow these instructions at home: Alcohol use Do not drink alcohol if your health care provider tells you not to drink. If you drink alcohol: Limit how much you have to 0-2 drinks a day. Know how much alcohol is in your drink. In the [place] one drink equals one 12 oz bottle of beer (355 mL), one 5 oz glass of wine (148 mL), or one 1 oz glass of hard liquor (44 mL). Lifestyle Do not use any products that contain nicotine or tobacco. These products include cigarettes, chewing tobacco, and vaping devices, such as e-cigarettes. If you need help quitting, ask your health care provider. Do not use street drugs. Do not share needles. Ask your health care provider for help if you need support or information about quitting drugs. General instructions Schedule regular health, dental, and eye exams. Stay current with your vaccines. Tell your health care provider if: You often feel depressed. You have ever been abused or do not feel safe at home. Summary Adopting a healthy lifestyle and getting preventive care are important in promoting health and wellness. Follow your health care provider's instructions about healthy diet, exercising, and getting tested or screened for diseases. Follow your health care provider's instructions on monitoring your cholesterol and blood pressure. This information is not intended to replace advice given to you by your health care provider. Make sure you discuss any questions you have with your health care provider. Document Revised: 04/05/2021 Document Reviewed: 04/05/2021 Elsevier Patient Education  2023 [employer]  

## 2022-12-28 NOTE — Progress Notes (Unsigned)
Subjective:  Patient ID: Ronald Bond, male    DOB: 1945/12/21  Age: 77 y.o. MRN: [id number]  CC: Annual Exam, Osteoarthritis, Hypothyroidism, Hypertension, Hyperlipidemia, Diabetes, and Anemia   HPI Ronald Bond presents for a CPX and f/up -   He is active and denies DOE, CP, SOB, edema, palpitations.  Outpatient Medications Prior to Visit  Medication Sig Dispense Refill   albuterol (VENTOLIN HFA) 108 (90 Base) MCG/ACT inhaler Inhale 2 puffs into the lungs every 6 (six) hours as needed for wheezing or shortness of breath. 18 g 3   amLODipine (NORVASC) 10 MG tablet Take 1 tablet (10 mg total) by mouth daily. 90 tablet 1   apixaban (ELIQUIS) 2.5 MG TABS tablet Take 1 tablet (2.5 mg total) by mouth 2 (two) times daily. 60 tablet 0   atorvastatin (LIPITOR) 40 MG tablet TAKE 1 TABLET BY MOUTH EVERY DAY 90 tablet 1   Blood Pressure Monitoring (BLOOD PRESSURE MONITOR/ARM) DEVI 1 Device by Does not apply route daily. USE DAILY TO  TAKE BLOOD PRESSURE READINGS 1 each 0   cetirizine (ZYRTEC) 10 MG tablet Take 10 mg by mouth daily.     cholecalciferol (VITAMIN D3) 25 MCG (1000 UNIT) tablet Take 1,000 Units by mouth daily.     docusate sodium (COLACE) 100 MG capsule Take 100 mg by mouth 2 (two) times daily.     FARXIGA 10 MG TABS tablet TAKE 1 TABLET BY MOUTH EVERY DAY BEFORE BREAKFAST 90 tablet 1   fluticasone (FLONASE) 50 MCG/ACT nasal spray Place 2 sprays into both nostrils daily. 48 g 1   hydrALAZINE (APRESOLINE) 25 MG tablet TAKE 1 TABLET BY MOUTH THREE TIMES A DAY 270 tablet 0   indapamide (LOZOL) 1.25 MG tablet Take 1 tablet (1.25 mg total) by mouth daily. 30 tablet 0   levothyroxine (SYNTHROID) 112 MCG tablet Take 1 tablet (112 mcg total) by mouth daily. 90 tablet 3   metoprolol tartrate (LOPRESSOR) 25 MG tablet Take 0.5 tablets (12.5 mg total) by mouth 2 (two) times daily. 90 tablet 3   Multiple Vitamin (MULTIVITAMIN) tablet Take 1 tablet by mouth daily.     Multiple  Vitamins-Minerals (ZINC PO) Take 1 tablet by mouth daily.     omeprazole (PRILOSEC) 40 MG capsule Take 1 capsule (40 mg total) by mouth daily. 90 capsule 1   Polyethyl Glycol-Propyl Glycol 0.4-0.3 % SOLN Place 1 drop into both eyes as needed (dry eyes).     tamsulosin (FLOMAX) 0.4 MG CAPS capsule Take 1 capsule (0.4 mg total) by mouth daily after breakfast. 90 capsule 1   TURMERIC CURCUMIN PO Take 1 tablet by mouth daily.     valsartan (DIOVAN) 320 MG tablet TAKE 1 TABLET BY MOUTH EVERY DAY 90 tablet 1   vitamin C (ASCORBIC ACID) 500 MG tablet Take 500 mg by mouth daily.     chlorproMAZINE (THORAZINE) 25 MG tablet Take 1 tablet (25 mg total) by mouth 4 (four) times daily as needed. 40 tablet 0   KLOR-CON M20 20 MEQ tablet TAKE 1 TABLET BY MOUTH TWICE A DAY 180 tablet 0   meloxicam (MOBIC) 7.5 MG tablet TAKE 1 TABLET BY MOUTH EVERY DAY 30 tablet 0   No facility-administered medications prior to visit.    ROS Review of Systems  Constitutional: Negative.  Negative for chills, diaphoresis, fatigue and fever.  HENT: Negative.    Eyes: Negative.   Respiratory: Negative.  Negative for cough, chest tightness, shortness of breath and wheezing.  Cardiovascular:  Negative for chest pain, palpitations and leg swelling.  Gastrointestinal:  Negative for abdominal pain, diarrhea, nausea and vomiting.  Genitourinary:  Positive for difficulty urinating. Negative for dysuria, frequency, penile discharge, scrotal swelling, testicular pain and urgency.  Musculoskeletal:  Positive for arthralgias. Negative for back pain and myalgias.  Skin: Negative.   Neurological:  Negative for dizziness, weakness and light-headedness.  Hematological:  Negative for adenopathy. Does not bruise/bleed easily.  Psychiatric/Behavioral: Negative.      Objective:  BP 136/78 (BP Location: Left Arm, Patient Position: Sitting, Cuff Size: Normal)   Pulse (!) 45   Temp 98.1 F (36.7 C) (Oral)   Resp 16   Ht '5\' 10"'$  (1.778 m)    Wt 212 lb (96.2 kg)   SpO2 100%   BMI 30.42 kg/m   BP Readings from Last 3 Encounters:  12/28/22 136/78  12/26/22 136/74  12/01/22 122/80    Wt Readings from Last 3 Encounters:  12/28/22 212 lb (96.2 kg)  12/26/22 214 lb (97.1 kg)  12/01/22 213 lb (96.6 kg)    Physical Exam Vitals reviewed.  HENT:     Mouth/Throat:     Mouth: Mucous membranes are moist.  Eyes:     General: No scleral icterus.    Pupils: Pupils are equal, round, and reactive to light.  Cardiovascular:     Rate and Rhythm: Regular rhythm. Bradycardia present.     Heart sounds: No murmur heard.    No gallop.  Pulmonary:     Effort: Pulmonary effort is normal.     Breath sounds: No stridor. No wheezing, rhonchi or rales.  Abdominal:     General: Abdomen is flat.     Palpations: There is no mass.     Tenderness: There is no abdominal tenderness. There is no guarding.     Hernia: No hernia is present. There is no hernia in the left inguinal area or right inguinal area.  Genitourinary:    Pubic Area: No rash.      Penis: Normal and uncircumcised.      Testes: Normal.     Epididymis:     Right: Normal.     Left: Normal.     Prostate: Enlarged. Not tender and no nodules present.     Rectum: Normal. Guaiac result negative. No mass, tenderness, anal fissure, external hemorrhoid or internal hemorrhoid. Normal anal tone.  Musculoskeletal:        General: Normal range of motion.     Cervical back: Neck supple.     Right lower leg: No edema.     Left lower leg: No edema.  Lymphadenopathy:     Cervical: No cervical adenopathy.     Lower Body: No right inguinal adenopathy. No left inguinal adenopathy.  Skin:    General: Skin is warm and dry.     Findings: No rash.  Neurological:     General: No focal deficit present.     Mental Status: He is alert and oriented to person, place, and time.  Psychiatric:        Mood and Affect: Mood normal.        Behavior: Behavior normal.     Lab Results  Component  Value Date   WBC 7.8 12/28/2022   HGB 12.5 (L) 12/28/2022   HCT 36.6 (L) 12/28/2022   PLT 293.0 12/28/2022   GLUCOSE 90 12/28/2022   CHOL 159 06/06/2022   TRIG 90.0 06/06/2022   HDL 56.60 06/06/2022   LDLDIRECT 75.0 03/08/2016  LDLCALC 84 06/06/2022   ALT 17 06/06/2022   AST 20 06/06/2022   NA 137 12/28/2022   K 4.6 12/28/2022   CL 100 12/28/2022   CREATININE 1.75 (H) 12/28/2022   BUN 23 12/28/2022   CO2 28 12/28/2022   TSH 2.60 12/01/2022   PSA 3.58 12/28/2022   INR 0.9 10/17/2007   HGBA1C 6.2 12/28/2022   MICROALBUR 4.6 (H) 12/28/2022    DG Knee Left Port  Result Date: 07/06/2022 CLINICAL DATA:  Status post total left knee arthroplasty. EXAM: PORTABLE LEFT KNEE - 1-2 VIEW COMPARISON:  Left knee radiographs 12/15/2004, 04/04/2022 FINDINGS: Status post revision of total left knee arthroplasty now with longer femoral and tibial stems. No perihardware lucency is seen to indicate hardware failure or loosening. Expected postoperative changes including intra-articular air and subcutaneous air. Anterior distal thigh vacuum assisted drain. No acute fracture or dislocation. IMPRESSION: Status post revision of total left knee arthroplasty without evidence of hardware failure. Electronically Signed   By: Yvonne Kendall M.D.   On: 07/06/2022 13:59    Assessment & Plan:   Deadrian was seen today for annual exam, osteoarthritis, hypothyroidism, hypertension, hyperlipidemia, diabetes and anemia.  Diagnoses and all orders for this visit:  Essential hypertension, benign- His blood pressure is well-controlled. -     Basic metabolic panel; Future -     Urinalysis, Routine w reflex microscopic; Future -     Urinalysis, Routine w reflex microscopic -     Basic metabolic panel  Type II diabetes mellitus with manifestations ([place])- His blood sugar is well-controlled. -     Basic metabolic panel; Future -     Hemoglobin A1c; Future -     Microalbumin / creatinine urine ratio; Future -     HM  Diabetes Foot Exam -     Microalbumin / creatinine urine ratio -     Hemoglobin A1c -     Basic metabolic panel -     Finerenone (KERENDIA) 10 MG TABS; Take 1 tablet (10 mg total) by mouth daily.  Stage 3b chronic kidney disease ([place])- I recommended that he start a mineralocorticoid antagonist. -     Basic metabolic panel; Future -     Urinalysis, Routine w reflex microscopic; Future -     Microalbumin / creatinine urine ratio; Future -     Microalbumin / creatinine urine ratio -     Urinalysis, Routine w reflex microscopic -     Basic metabolic panel -     Finerenone (KERENDIA) 10 MG TABS; Take 1 tablet (10 mg total) by mouth daily.  BPH associated with nocturia- His PSA is not rising. -     PSA; Future -     Urinalysis, Routine w reflex microscopic; Future -     Urinalysis, Routine w reflex microscopic -     PSA  Iron deficiency anemia secondary to inadequate dietary iron intake- I will monitor his iron level. -     CBC with Differential/Platelet; Future -     IBC + Ferritin; Future -     IBC + Ferritin -     CBC with Differential/Platelet  Thiamine deficiency- I will monitor his thiamine level. -     CBC with Differential/Platelet; Future -     Vitamin B1; Future -     Vitamin B1 -     CBC with Differential/Platelet  Encounter for general adult medical examination with abnormal findings- Exam completed, labs reviewed, vaccines reviewed and updated, cancer screenings are  up-to-date, patient education was given.  Other orders -     Pneumococcal polysaccharide vaccine 23-valent greater than or equal to 2yo subcutaneous/IM   I have discontinued Jeneen Rinks T. Pickrel's chlorproMAZINE, meloxicam, and Klor-Con M20. I am also having him start on Kerendia. Additionally, I am having him maintain his multivitamin, fluticasone, Polyethyl Glycol-Propyl Glycol, ascorbic acid, cholecalciferol, docusate sodium, albuterol, Blood Pressure Monitor/Arm, amLODipine, Farxiga, cetirizine, TURMERIC  CURCUMIN PO, Multiple Vitamins-Minerals (ZINC PO), apixaban, valsartan, omeprazole, tamsulosin, metoprolol tartrate, levothyroxine, indapamide, hydrALAZINE, and atorvastatin.  Meds ordered this encounter  Medications   Finerenone (KERENDIA) 10 MG TABS    Sig: Take 1 tablet (10 mg total) by mouth daily.    Dispense:  30 tablet    Refill:  0     Follow-up: Return in about 6 months (around 06/28/2023).  Scarlette Calico, MD

## 2023-01-01 LAB — VITAMIN B1: Vitamin B1 (Thiamine): 24 nmol/L (ref 8–30)

## 2023-01-02 MED ORDER — KERENDIA 10 MG PO TABS: 1.0000 | ORAL_TABLET | Freq: Every day | ORAL | 0 refills | Status: DC

## 2023-01-03 ENCOUNTER — Telehealth: Payer: Self-pay

## 2023-01-03 ENCOUNTER — Telehealth: Payer: Self-pay | Admitting: Internal Medicine

## 2023-01-03 DIAGNOSIS — M25561 Pain in right knee: Secondary | ICD-10-CM | POA: Diagnosis not present

## 2023-01-03 DIAGNOSIS — T84013D Broken internal left knee prosthesis, subsequent encounter: Secondary | ICD-10-CM | POA: Diagnosis not present

## 2023-01-03 NOTE — Telephone Encounter (Signed)
   Patient Name: Ronald Bond  DOB: 1945-12-06 MRN: [id number]  Primary Cardiologist: Glenetta Hew, MD  Patient was prescribed Eliquis for DVT prophylaxis in the postop setting per Dr. Zachery Dakins  Cardiology does not manage patient's Eliquis.  I will route this recommendation to the requesting party via Epic fax function and remove from pre-op pool.  Please call with questions.  Lenna Sciara, NP 01/03/2023, 4:10 PM

## 2023-01-03 NOTE — Telephone Encounter (Signed)
For our records:   We have received Pre-Op PW for the pt and it has been placed in Dr. Ronnald Ramp' boxes.   Upon completion please fax to:  [phone]

## 2023-01-03 NOTE — Telephone Encounter (Signed)
   Pre-operative Risk Assessment    Patient Name: Ronald Bond  DOB: 06-16-1946 MRN: [id number]      Request for Surgical Clearance    Procedure:   Right Total Knee Replacement  Date of Surgery:  Clearance TBD                                 Surgeon:  Dr Charlies Constable, MD  Surgeon's Group or Practice Name:   [facility] Phone number:  [phone] x [place] Fax number:  [phone]   Type of Clearance Requested:   - Pharmacy:  Hold Apixaban (Eliquis)     Type of Anesthesia:  Spinal   Additional requests/questions:   n/a  Toma Deiters   01/03/2023, 1:51 PM

## 2023-01-03 NOTE — Telephone Encounter (Signed)
Form has been printed and given to PCP to review and sign.

## 2023-01-04 NOTE — Telephone Encounter (Signed)
   Patient Name: Ronald Bond  DOB: Feb 14, 1946 MRN: [id number]  Primary Cardiologist: Glenetta Hew, MD  Chart reviewed as part of pre-operative protocol coverage.    Attempted to contact patient to discuss aspirin hold prior to right total knee replacement.  No answer.  Left message for patient to call back at earliest convenience.  Lenna Sciara, NP 01/04/2023, 11:05 AM

## 2023-01-04 NOTE — Telephone Encounter (Signed)
   Pre-operative Risk Assessment    Patient Name: Ronald Bond  DOB: December 19, 1945 MRN: [id number]      Request for Surgical Clearance    Procedure:   Right Total Knee Replacement  Date of Surgery:  Clearance TBD                                 Surgeon:  Dr Charlies Constable, MD Surgeon's Group or Practice Name:  [facility] Phone number:  [phone] x [phone] [facility] [place] Fax number:  [phone]   Type of Clearance Requested:   - Pharmacy:  Hold Aspirin     Type of Anesthesia:  Spinal   Additional requests/questions:   N/A  Toma Deiters   01/04/2023, 10:50 AM

## 2023-01-04 NOTE — Telephone Encounter (Signed)
   Patient Name: Ronald Bond  DOB: [birth year] MRN: [id number]  Primary Cardiologist: Glenetta Hew, MD  Chart reviewed as part of pre-operative protocol coverage.   Per office protocol,he may hold Aspirin for 5-7 days prior to procedure. Please resume Asprin as soon as possible postprocedure, at the discretion of the surgeon.   The patient was advised that if he develops new symptoms prior to surgery to contact our office to arrange for a follow-up visit, and he verbalized understanding.  I will route this recommendation to the requesting party via Epic fax function and remove from pre-op pool.  Please call with questions.  Lenna Sciara, NP 01/04/2023, 12:12 PM

## 2023-01-04 NOTE — Telephone Encounter (Signed)
The request is for Aspirin not Eliquis.   My apologies for the error.

## 2023-01-04 NOTE — Telephone Encounter (Signed)
Form has been signed and faxed back 

## 2023-01-16 DIAGNOSIS — E785 Hyperlipidemia, unspecified: Secondary | ICD-10-CM | POA: Diagnosis not present

## 2023-01-16 DIAGNOSIS — J309 Allergic rhinitis, unspecified: Secondary | ICD-10-CM | POA: Diagnosis not present

## 2023-01-16 DIAGNOSIS — E890 Postprocedural hypothyroidism: Secondary | ICD-10-CM | POA: Diagnosis not present

## 2023-01-16 DIAGNOSIS — E1151 Type 2 diabetes mellitus with diabetic peripheral angiopathy without gangrene: Secondary | ICD-10-CM | POA: Diagnosis not present

## 2023-01-16 DIAGNOSIS — M1990 Unspecified osteoarthritis, unspecified site: Secondary | ICD-10-CM | POA: Diagnosis not present

## 2023-01-16 DIAGNOSIS — G4733 Obstructive sleep apnea (adult) (pediatric): Secondary | ICD-10-CM | POA: Diagnosis not present

## 2023-01-16 DIAGNOSIS — Z008 Encounter for other general examination: Secondary | ICD-10-CM | POA: Diagnosis not present

## 2023-01-16 DIAGNOSIS — I509 Heart failure, unspecified: Secondary | ICD-10-CM | POA: Diagnosis not present

## 2023-01-16 DIAGNOSIS — N529 Male erectile dysfunction, unspecified: Secondary | ICD-10-CM | POA: Diagnosis not present

## 2023-01-16 DIAGNOSIS — K219 Gastro-esophageal reflux disease without esophagitis: Secondary | ICD-10-CM | POA: Diagnosis not present

## 2023-01-16 DIAGNOSIS — N189 Chronic kidney disease, unspecified: Secondary | ICD-10-CM | POA: Diagnosis not present

## 2023-01-16 DIAGNOSIS — N3941 Urge incontinence: Secondary | ICD-10-CM | POA: Diagnosis not present

## 2023-01-16 DIAGNOSIS — E669 Obesity, unspecified: Secondary | ICD-10-CM | POA: Diagnosis not present

## 2023-01-17 ENCOUNTER — Other Ambulatory Visit: Payer: Self-pay

## 2023-01-17 MED ORDER — LEVOTHYROXINE SODIUM 112 MCG PO TABS: 112.0000 ug | ORAL_TABLET | Freq: Every day | ORAL | 3 refills | Status: DC

## 2023-01-19 ENCOUNTER — Other Ambulatory Visit: Payer: Self-pay | Admitting: Internal Medicine

## 2023-01-19 DIAGNOSIS — N1832 Chronic kidney disease, stage 3b: Secondary | ICD-10-CM

## 2023-01-19 DIAGNOSIS — E118 Type 2 diabetes mellitus with unspecified complications: Secondary | ICD-10-CM

## 2023-01-28 ENCOUNTER — Other Ambulatory Visit: Payer: Self-pay | Admitting: Internal Medicine

## 2023-01-28 DIAGNOSIS — I10 Essential (primary) hypertension: Secondary | ICD-10-CM

## 2023-01-28 DIAGNOSIS — I119 Hypertensive heart disease without heart failure: Secondary | ICD-10-CM

## 2023-02-09 NOTE — Progress Notes (Signed)
Surgery orders requested via Epic inbox. °

## 2023-02-14 ENCOUNTER — Other Ambulatory Visit (HOSPITAL_COMMUNITY): Payer: Medicare HMO

## 2023-02-14 DIAGNOSIS — M1711 Unilateral primary osteoarthritis, right knee: Secondary | ICD-10-CM | POA: Diagnosis not present

## 2023-02-15 ENCOUNTER — Ambulatory Visit: Payer: Self-pay | Admitting: Physician Assistant

## 2023-02-15 DIAGNOSIS — G8929 Other chronic pain: Secondary | ICD-10-CM

## 2023-02-15 NOTE — H&P (View-Only) (Signed)
TOTAL KNEE ADMISSION H&P  Patient is being admitted for right total knee arthroplasty.  Subjective:  Chief Complaint:right knee pain.  HPI: Ronald Bond, 77 y.o. male, has a history of pain and functional disability in the right knee due to arthritis and has failed non-surgical conservative treatments for greater than 12 weeks to includeNSAID's and/or analgesics, corticosteriod injections, supervised PT with diminished ADL's post treatment, use of assistive devices, and activity modification.  Onset of symptoms was gradual, starting 9 years ago with gradually worsening course since that time. The patient noted no past surgery on the right knee(s).  Patient currently rates pain in the right knee(s) at 8 out of 10 with activity. Patient has night pain, worsening of pain with activity and weight bearing, pain that interferes with activities of daily living, pain with passive range of motion, crepitus, and joint swelling.  Patient has evidence of periarticular osteophytes and joint space narrowing by imaging studies. There is no active infection.  Patient Active Problem List   Diagnosis Date Noted  . PAC (premature atrial contraction) 12/27/2021  . Encounter for general adult medical examination with abnormal findings 06/22/2021  . Bradycardia 11/30/2020  . Graves' orbitopathy 07/13/2020  . Type II diabetes mellitus with manifestations (HCC) 05/13/2020  . Graves disease 10/07/2019  . Postablative hypothyroidism 10/07/2019  . Thyroid-related proptosis 08/15/2019  . Chronic renal disease, stage 3, moderately decreased glomerular filtration rate (GFR) between 30-59 mL/min/1.73 square meter (HCC) 08/15/2019  . Long-term current use of opiate analgesic 05/14/2018  . Bilateral epiphora 11/16/2017  . Thiamine deficiency 06/08/2017  . Obesity (BMI 30.0-34.9) 06/04/2017  . Hypertensive heart disease without CHF (congestive heart failure) 12/25/2016  . Right cervical radiculopathy 06/16/2016  . Iron  deficiency anemia 03/08/2016  . Spinal stenosis in cervical region 08/25/2015  . Allergic eczema 08/07/2014  . Obstructive sleep apnea 11/12/2013  . Mild intermittent asthma 06/03/2013  . Herniated lumbar disc without myelopathy 10/18/2012  . Allergic rhinitis 03/10/2011  . Cardiomegaly 10/18/2010  . ERECTILE DYSFUNCTION, NON-ORGANIC, MILD 06/29/2009  . Hyperlipidemia associated with type 2 diabetes mellitus (HCC) 05/28/2009  . Essential hypertension, benign 05/28/2009  . GERD 05/28/2009  . BPH associated with nocturia 05/28/2009  . Primary osteoarthritis of right knee 05/28/2009   Past Medical History:  Diagnosis Date  . GERD (gastroesophageal reflux disease)   . HTN (hypertension)   . Hyperlipidemia   . Hypothyroidism   . Osteoarthritis   . Pre-diabetes   . Wears glasses     Past Surgical History:  Procedure Laterality Date  . COLONOSCOPY    . EYE SURGERY  2006   both cataracts  . MICROLARYNGOSCOPY Left 11/18/2013   Procedure: MICROLARYNGOSCOPY WITH REMOVAL OF GRANULOMA LEFT SIDE;  Surgeon: Jefry Rosen, MD;  Location: [facility];  Service: ENT;  Laterality: Left;  . NM MYOVIEW LTD  06/2007   Low risk, normal.  No evidence of ischemia or infarction  . THYROIDECTOMY  1974  . TONSILLECTOMY     as a child  . TOTAL KNEE ARTHROPLASTY  2009   left  . TOTAL KNEE REVISION Left 07/06/2022   Procedure: TOTAL KNEE REVISION;  Surgeon: Marchwiany, Daniel A, MD;  Location: [facility];  Service: Orthopedics;  Laterality: Left;    Current Outpatient Medications  Medication Sig Dispense Refill Last Dose  . albuterol (VENTOLIN HFA) 108 (90 Base) MCG/ACT inhaler Inhale 2 puffs into the lungs every 6 (six) hours as needed for wheezing or shortness of breath. 18 g 3   .   amLODipine (NORVASC) 10 MG tablet TAKE 1 TABLET BY MOUTH EVERY DAY 90 tablet 1   . apixaban (ELIQUIS) 2.5 MG TABS tablet Take 1 tablet (2.5 mg total) by mouth 2 (two) times daily. 60 tablet 0   . atorvastatin  (LIPITOR) 40 MG tablet TAKE 1 TABLET BY MOUTH EVERY DAY 90 tablet 1   . Blood Pressure Monitoring (BLOOD PRESSURE MONITOR/ARM) DEVI 1 Device by Does not apply route daily. USE DAILY TO  TAKE BLOOD PRESSURE READINGS 1 each 0   . cetirizine (ZYRTEC) 10 MG tablet Take 10 mg by mouth daily.     . cholecalciferol (VITAMIN D3) 25 MCG (1000 UNIT) tablet Take 1,000 Units by mouth daily.     . docusate sodium (COLACE) 100 MG capsule Take 100 mg by mouth 2 (two) times daily.     . FARXIGA 10 MG TABS tablet TAKE 1 TABLET BY MOUTH EVERY DAY BEFORE BREAKFAST 90 tablet 1   . Finerenone (KERENDIA) 10 MG TABS Take 1 tablet (10 mg total) by mouth daily. 30 tablet 0   . fluticasone (FLONASE) 50 MCG/ACT nasal spray Place 2 sprays into both nostrils daily. 48 g 1   . hydrALAZINE (APRESOLINE) 25 MG tablet TAKE 1 TABLET BY MOUTH THREE TIMES A DAY 270 tablet 0   . indapamide (LOZOL) 1.25 MG tablet Take 1 tablet (1.25 mg total) by mouth daily. 30 tablet 0   . levothyroxine (SYNTHROID) 112 MCG tablet Take 1 tablet (112 mcg total) by mouth daily. 90 tablet 3   . metoprolol tartrate (LOPRESSOR) 25 MG tablet Take 0.5 tablets (12.5 mg total) by mouth 2 (two) times daily. 90 tablet 3   . Multiple Vitamin (MULTIVITAMIN) tablet Take 1 tablet by mouth daily.     . Multiple Vitamins-Minerals (ZINC PO) Take 1 tablet by mouth daily.     . omeprazole (PRILOSEC) 40 MG capsule Take 1 capsule (40 mg total) by mouth daily. 90 capsule 1   . Polyethyl Glycol-Propyl Glycol 0.4-0.3 % SOLN Place 1 drop into both eyes as needed (dry eyes).     . tamsulosin (FLOMAX) 0.4 MG CAPS capsule Take 1 capsule (0.4 mg total) by mouth daily after breakfast. 90 capsule 1   . TURMERIC CURCUMIN PO Take 1 tablet by mouth daily.     . valsartan (DIOVAN) 320 MG tablet TAKE 1 TABLET BY MOUTH EVERY DAY 90 tablet 1   . vitamin C (ASCORBIC ACID) 500 MG tablet Take 500 mg by mouth daily.      No current facility-administered medications for this visit.    Allergies  Allergen Reactions  . Lisinopril Other (See Comments)    Edema in face    Social History   Tobacco Use  . Smoking status: Never  . Smokeless tobacco: Never  Substance Use Topics  . Alcohol use: Yes    Comment: occassionally    Family History  Problem Relation Age of Onset  . Brain cancer Mother   . Heart disease Father   . Heart disease Brother      Review of Systems  HENT:  Positive for hearing loss.   Gastrointestinal:  Positive for constipation.  Genitourinary:  Positive for frequency.  Musculoskeletal:  Positive for arthralgias.  All other systems reviewed and are negative.  Objective:  Physical Exam Constitutional:      General: He is not in acute distress.    Appearance: Normal appearance.  HENT:     Head: Normocephalic and atraumatic.  Eyes:       Extraocular Movements: Extraocular movements intact.     Pupils: Pupils are equal, round, and reactive to light.  Cardiovascular:     Rate and Rhythm: Normal rate and regular rhythm.     Pulses: Normal pulses.     Heart sounds: Normal heart sounds.  Pulmonary:     Effort: Pulmonary effort is normal. No respiratory distress.     Breath sounds: Normal breath sounds. No wheezing.  Abdominal:     General: Abdomen is flat. Bowel sounds are normal. There is no distension.     Palpations: Abdomen is soft.     Tenderness: There is no abdominal tenderness.  Musculoskeletal:     Cervical back: Normal range of motion and neck supple.     Right knee: Swelling and bony tenderness present. No effusion or erythema. Decreased range of motion. Tenderness present.  Lymphadenopathy:     Cervical: No cervical adenopathy.  Skin:    General: Skin is warm and dry.     Findings: No erythema or rash.  Neurological:     General: No focal deficit present.     Mental Status: He is alert and oriented to person, place, and time.  Psychiatric:        Mood and Affect: Mood normal.        Behavior: Behavior normal.    Vital signs in last 24 hours: @VSRANGES@  Labs:   Estimated body mass index is 30.42 kg/m as calculated from the following:   Height as of 12/28/22: 5' 10" (1.778 m).   Weight as of 12/28/22: 96.2 kg.   Imaging Review Plain radiographs demonstrate moderate degenerative joint disease of the right knee(s). The overall alignment ismild varus. The bone quality appears to be good for age and reported activity level.      Assessment/Plan:  End stage arthritis, right knee   The patient history, physical examination, clinical judgment of the provider and imaging studies are consistent with end stage degenerative joint disease of the right knee(s) and total knee arthroplasty is deemed medically necessary. The treatment options including medical management, injection therapy arthroscopy and arthroplasty were discussed at length. The risks and benefits of total knee arthroplasty were presented and reviewed. The risks due to aseptic loosening, infection, stiffness, patella tracking problems, thromboembolic complications and other imponderables were discussed. The patient acknowledged the explanation, agreed to proceed with the plan and consent was signed. Patient is being admitted for inpatient treatment for surgery, pain control, PT, OT, prophylactic antibiotics, VTE prophylaxis, progressive ambulation and ADL's and discharge planning. The patient is planning to be discharged home with home health services    Anticipated LOS equal to or greater than 2 midnights due to - Age 65 and older with one or more of the following:  - Obesity  - Expected need for hospital services (PT, OT, Nursing) required for safe  discharge  - Anticipated need for postoperative skilled nursing care or inpatient rehab  - Active co-morbidities: Diabetes OR   - Unanticipated findings during/Post Surgery: None  - Patient is a high risk of re-admission due to: None 

## 2023-02-15 NOTE — H&P (Signed)
TOTAL KNEE ADMISSION H&P  Patient is being admitted for right total knee arthroplasty.  Subjective:  Chief Complaint:right knee pain.  HPI: Ronald Bond, 77 y.o. male, has a history of pain and functional disability in the right knee due to arthritis and has failed non-surgical conservative treatments for greater than 12 weeks to includeNSAID's and/or analgesics, corticosteriod injections, supervised PT with diminished ADL's post treatment, use of assistive devices, and activity modification.  Onset of symptoms was gradual, starting 9 years ago with gradually worsening course since that time. The patient noted no past surgery on the right knee(s).  Patient currently rates pain in the right knee(s) at 8 out of 10 with activity. Patient has night pain, worsening of pain with activity and weight bearing, pain that interferes with activities of daily living, pain with passive range of motion, crepitus, and joint swelling.  Patient has evidence of periarticular osteophytes and joint space narrowing by imaging studies. There is no active infection.  Patient Active Problem List   Diagnosis Date Noted  . PAC (premature atrial contraction) 12/27/2021  . Encounter for general adult medical examination with abnormal findings 06/22/2021  . Bradycardia 11/30/2020  . Graves' orbitopathy 07/13/2020  . Type II diabetes mellitus with manifestations ([place]) 05/13/2020  . Graves disease 10/07/2019  . Postablative hypothyroidism 10/07/2019  . Thyroid-related proptosis 08/15/2019  . Chronic renal disease, stage 3, moderately decreased glomerular filtration rate (GFR) between 30-59 mL/min/1.73 square meter ([place]) 08/15/2019  . Long-term current use of opiate analgesic 05/14/2018  . Bilateral epiphora 11/16/2017  . Thiamine deficiency 06/08/2017  . Obesity (BMI 30.0-34.9) 06/04/2017  . Hypertensive heart disease without CHF (congestive heart failure) 12/25/2016  . Right cervical radiculopathy 06/16/2016  . Iron  deficiency anemia 03/08/2016  . Spinal stenosis in cervical region 08/25/2015  . Allergic eczema 08/07/2014  . Obstructive sleep apnea 11/12/2013  . Mild intermittent asthma 06/03/2013  . Herniated lumbar disc without myelopathy 10/18/2012  . Allergic rhinitis 03/10/2011  . Cardiomegaly 10/18/2010  . ERECTILE DYSFUNCTION, NON-ORGANIC, MILD 06/29/2009  . Hyperlipidemia associated with type 2 diabetes mellitus ([place]) 05/28/2009  . Essential hypertension, benign 05/28/2009  . GERD 05/28/2009  . BPH associated with nocturia 05/28/2009  . Primary osteoarthritis of right knee 05/28/2009   Past Medical History:  Diagnosis Date  . GERD (gastroesophageal reflux disease)   . HTN (hypertension)   . Hyperlipidemia   . Hypothyroidism   . Osteoarthritis   . Pre-diabetes   . Wears glasses     Past Surgical History:  Procedure Laterality Date  . COLONOSCOPY    . EYE SURGERY  2006   both cataracts  . MICROLARYNGOSCOPY Left 11/18/2013   Procedure: MICROLARYNGOSCOPY WITH REMOVAL OF GRANULOMA LEFT SIDE;  Surgeon: Izora Gala, MD;  Location: [facility];  Service: ENT;  Laterality: Left;  . NM MYOVIEW LTD  06/2007   Low risk, normal.  No evidence of ischemia or infarction  . THYROIDECTOMY  1974  . TONSILLECTOMY     as a child  . TOTAL KNEE ARTHROPLASTY  2009   left  . TOTAL KNEE REVISION Left 07/06/2022   Procedure: TOTAL KNEE REVISION;  Surgeon: Willaim Sheng, MD;  Location: [facility];  Service: Orthopedics;  Laterality: Left;    Current Outpatient Medications  Medication Sig Dispense Refill Last Dose  . albuterol (VENTOLIN HFA) 108 (90 Base) MCG/ACT inhaler Inhale 2 puffs into the lungs every 6 (six) hours as needed for wheezing or shortness of breath. 18 g 3   .  amLODipine (NORVASC) 10 MG tablet TAKE 1 TABLET BY MOUTH EVERY DAY 90 tablet 1   . apixaban (ELIQUIS) 2.5 MG TABS tablet Take 1 tablet (2.5 mg total) by mouth 2 (two) times daily. 60 tablet 0   . atorvastatin  (LIPITOR) 40 MG tablet TAKE 1 TABLET BY MOUTH EVERY DAY 90 tablet 1   . Blood Pressure Monitoring (BLOOD PRESSURE MONITOR/ARM) DEVI 1 Device by Does not apply route daily. USE DAILY TO  TAKE BLOOD PRESSURE READINGS 1 each 0   . cetirizine (ZYRTEC) 10 MG tablet Take 10 mg by mouth daily.     . cholecalciferol (VITAMIN D3) 25 MCG (1000 UNIT) tablet Take 1,000 Units by mouth daily.     Marland Kitchen docusate sodium (COLACE) 100 MG capsule Take 100 mg by mouth 2 (two) times daily.     Marland Kitchen FARXIGA 10 MG TABS tablet TAKE 1 TABLET BY MOUTH EVERY DAY BEFORE BREAKFAST 90 tablet 1   . Finerenone (KERENDIA) 10 MG TABS Take 1 tablet (10 mg total) by mouth daily. 30 tablet 0   . fluticasone (FLONASE) 50 MCG/ACT nasal spray Place 2 sprays into both nostrils daily. 48 g 1   . hydrALAZINE (APRESOLINE) 25 MG tablet TAKE 1 TABLET BY MOUTH THREE TIMES A DAY 270 tablet 0   . indapamide (LOZOL) 1.25 MG tablet Take 1 tablet (1.25 mg total) by mouth daily. 30 tablet 0   . levothyroxine (SYNTHROID) 112 MCG tablet Take 1 tablet (112 mcg total) by mouth daily. 90 tablet 3   . metoprolol tartrate (LOPRESSOR) 25 MG tablet Take 0.5 tablets (12.5 mg total) by mouth 2 (two) times daily. 90 tablet 3   . Multiple Vitamin (MULTIVITAMIN) tablet Take 1 tablet by mouth daily.     . Multiple Vitamins-Minerals (ZINC PO) Take 1 tablet by mouth daily.     Marland Kitchen omeprazole (PRILOSEC) 40 MG capsule Take 1 capsule (40 mg total) by mouth daily. 90 capsule 1   . Polyethyl Glycol-Propyl Glycol 0.4-0.3 % SOLN Place 1 drop into both eyes as needed (dry eyes).     . tamsulosin (FLOMAX) 0.4 MG CAPS capsule Take 1 capsule (0.4 mg total) by mouth daily after breakfast. 90 capsule 1   . TURMERIC CURCUMIN PO Take 1 tablet by mouth daily.     . valsartan (DIOVAN) 320 MG tablet TAKE 1 TABLET BY MOUTH EVERY DAY 90 tablet 1   . vitamin C (ASCORBIC ACID) 500 MG tablet Take 500 mg by mouth daily.      No current facility-administered medications for this visit.    Allergies  Allergen Reactions  . Lisinopril Other (See Comments)    Edema in face    Social History   Tobacco Use  . Smoking status: Never  . Smokeless tobacco: Never  Substance Use Topics  . Alcohol use: Yes    Comment: occassionally    Family History  Problem Relation Age of [facility]  . Brain cancer Mother   . Heart disease Father   . Heart disease Brother      Review of Systems  HENT:  Positive for hearing loss.   Gastrointestinal:  Positive for constipation.  Genitourinary:  Positive for frequency.  Musculoskeletal:  Positive for arthralgias.  All other systems reviewed and are negative.  Objective:  Physical Exam Constitutional:      General: He is not in acute distress.    Appearance: Normal appearance.  HENT:     Head: Normocephalic and atraumatic.  Eyes:  Extraocular Movements: Extraocular movements intact.     Pupils: Pupils are equal, round, and reactive to light.  Cardiovascular:     Rate and Rhythm: Normal rate and regular rhythm.     Pulses: Normal pulses.     Heart sounds: Normal heart sounds.  Pulmonary:     Effort: Pulmonary effort is normal. No respiratory distress.     Breath sounds: Normal breath sounds. No wheezing.  Abdominal:     General: Abdomen is flat. Bowel sounds are normal. There is no distension.     Palpations: Abdomen is soft.     Tenderness: There is no abdominal tenderness.  Musculoskeletal:     Cervical back: Normal range of motion and neck supple.     Right knee: Swelling and bony tenderness present. No effusion or erythema. Decreased range of motion. Tenderness present.  Lymphadenopathy:     Cervical: No cervical adenopathy.  Skin:    General: Skin is warm and dry.     Findings: No erythema or rash.  Neurological:     General: No focal deficit present.     Mental Status: He is alert and oriented to person, place, and time.  Psychiatric:        Mood and Affect: Mood normal.        Behavior: Behavior normal.    Vital signs in last 24 hours: @VSRANGES @  Labs:   Estimated body mass index is 30.42 kg/m as calculated from the following:   Height as of 12/28/22: 5\' 10"  (1.778 m).   Weight as of 12/28/22: 96.2 kg.   Imaging Review Plain radiographs demonstrate moderate degenerative joint disease of the right knee(s). The overall alignment ismild varus. The bone quality appears to be good for age and reported activity level.      Assessment/Plan:  End stage arthritis, right knee   The patient history, physical examination, clinical judgment of the provider and imaging studies are consistent with end stage degenerative joint disease of the right knee(s) and total knee arthroplasty is deemed medically necessary. The treatment options including medical management, injection therapy arthroscopy and arthroplasty were discussed at length. The risks and benefits of total knee arthroplasty were presented and reviewed. The risks due to aseptic loosening, infection, stiffness, patella tracking problems, thromboembolic complications and other imponderables were discussed. The patient acknowledged the explanation, agreed to proceed with the plan and consent was signed. Patient is being admitted for inpatient treatment for surgery, pain control, PT, OT, prophylactic antibiotics, VTE prophylaxis, progressive ambulation and ADL's and discharge planning. The patient is planning to be discharged home with home health services    Anticipated LOS equal to or greater than 2 midnights due to - Age 39 and older with one or more of the following:  - Obesity  - Expected need for hospital services (PT, OT, Nursing) required for safe  discharge  - Anticipated need for postoperative skilled nursing care or inpatient rehab  - Active co-morbidities: Diabetes OR   - Unanticipated findings during/Post Surgery: None  - Patient is a high risk of re-admission due to: None

## 2023-02-15 NOTE — Patient Instructions (Addendum)
SURGICAL WAITING ROOM VISITATION Patients having surgery or a procedure may have no more than 2 support people in the waiting area - these visitors may rotate in the visitor waiting room.   Due to an increase in RSV and influenza rates and associated hospitalizations, children ages 30 and under may not visit patients in [place]. If the patient needs to stay at the hospital during part of their recovery, the visitor guidelines for inpatient rooms apply.  PRE-OP VISITATION  Pre-op nurse will coordinate an appropriate time for 1 support person to accompany the patient in pre-op.  This support person may not rotate.  This visitor will be contacted when the time is appropriate for the visitor to come back in the pre-op area.  Please refer to the [facility] website for the visitor guidelines for Inpatients (after your surgery is over and you are in a regular room).  You are not required to quarantine at this time prior to your surgery. However, you must do this: Hand Hygiene often Do NOT share personal items Notify your provider if you are in close contact with someone who has COVID or you develop fever 100.4 or greater, new onset of sneezing, cough, sore throat, shortness of breath or body aches.  If you test positive for Covid or have been in contact with anyone that has tested positive in the last 10 days please notify you surgeon.    Your procedure is scheduled on:  Monday February 27, 2023  Report to [facility] Main Entrance: [address] entrance where the [employer] is available.   Report to admitting at:  07:45 AM  +++++Call this number if you have any questions or problems the morning of surgery [phone]  Do not eat food after Midnight the night prior to your surgery/procedure.  After Midnight you may have the following liquids until    07:15 AM  DAY OF SURGERY  Clear Liquid Diet Water Black Coffee (sugar ok, NO MILK/CREAM OR CREAMERS)  Tea (sugar ok, NO  MILK/CREAM OR CREAMERS) regular and decaf                             Plain Jell-O  with no fruit (NO RED)                                           Fruit ices (not with fruit pulp, NO RED)                                     Popsicles (NO RED)                                                                  Juice: apple, WHITE grape, WHITE cranberry Sports drinks like Gatorade or Powerade (NO RED)                     The day of surgery:  Drink ONE (1) Pre-Surgery G2 at  07:15  AM the morning of surgery. Drink in one  sitting. Do not sip.  This drink was given to you during your hospital pre-op appointment visit. Nothing else to drink after completing the Pre-Surgery G2 : No candy, chewing gum or throat lozenges.    FOLLOW  ANY ADDITIONAL PRE OP INSTRUCTIONS YOU RECEIVED FROM YOUR SURGEON'S OFFICE!!!   Oral Hygiene is also important to reduce your risk of infection.        Remember - BRUSH YOUR TEETH THE MORNING OF SURGERY WITH YOUR REGULAR TOOTHPASTE  Do NOT smoke after Midnight the night before surgery.  ASPIRIN-  stop taking 5-7 days before your surgery. FARXIGA- Stop 72 hours prior to surgery. Last Dose: Thursday  February 23, 2023  Take ONLY these medicines the morning of surgery with A SIP OF WATER: Levothyroxine, omeprazole (Prilosec), tamsulosin (Flomax), amlodipine, Metoprolol, Hydralazine. You may use your Albuterol Inhaler if needed.   You may not have any metal on your body including  jewelry, and body piercing  Do not wear lotions, powders, cologne, or deodorant  Men may shave face and neck.  You may bring a small overnight bag with you on the day of surgery, only pack items that are not valuable. [facility] IS NOT RESPONSIBLE   FOR VALUABLES THAT ARE LOST OR STOLEN.   Do not bring your home medications to the hospital EXCEPT FOR YOUR ALBUTEROL INHALER. The Pharmacy will dispense medications listed on your medication list to you during your admission in the  Hospital.  Special Instructions: Bring a copy of your healthcare power of attorney and living will documents the day of surgery, if you wish to have them scanned into your [facility] Records- EPIC  Please read over the following fact sheets you were given: IF YOU HAVE QUESTIONS ABOUT YOUR [place], [place] [phone].   [facility] - Preparing for Surgery Before surgery, you can play an important role.  Because skin is not sterile, your skin needs to be as free of germs as possible.  You can reduce the number of germs on your skin by washing with CHG (chlorahexidine gluconate) soap before surgery.  CHG is an antiseptic cleaner which kills germs and bonds with the skin to continue killing germs even after washing. Please DO NOT use if you have an allergy to CHG or antibacterial soaps.  If your skin becomes reddened/irritated stop using the CHG and inform your nurse when you arrive at Short Stay. Do not shave (including legs and underarms) for at least 48 hours prior to the first CHG shower.  You may shave your face/neck.  Please follow these instructions carefully:  1.  Shower with CHG Soap the night before surgery and the  morning of surgery.  2.  If you choose to wash your hair, wash your hair first as usual with your normal  shampoo.  3.  After you shampoo, rinse your hair and body thoroughly to remove the shampoo.                             4.  Use CHG as you would any other liquid soap.  You can apply chg directly to the skin and wash.  Gently with a scrungie or clean washcloth.  5.  Apply the CHG Soap to your body ONLY FROM THE NECK DOWN.   Do not use on face/ open  Wound or open sores. Avoid contact with eyes, ears mouth and genitals (private parts).                       Wash face,  Genitals (private parts) with your normal soap.             6.  Wash thoroughly, paying special attention to the area where your  surgery  will be  performed.  7.  Thoroughly rinse your body with warm water from the neck down.  8.  DO NOT shower/wash with your normal soap after using and rinsing off the CHG Soap.            9.  Pat yourself dry with a clean towel.            10.  Wear clean pajamas.            11.  Place clean sheets on your bed the night of your first shower and do not  sleep with pets.  ON THE DAY OF SURGERY : Do not apply any lotions/deodorants the morning of surgery.  Please wear clean clothes to the hospital/surgery center.    FAILURE TO FOLLOW THESE INSTRUCTIONS MAY RESULT IN THE CANCELLATION OF YOUR SURGERY  PATIENT SIGNATURE_________________________________  NURSE SIGNATURE__________________________________  ________________________________________________________________________        Adam Phenix    An incentive spirometer is a tool that can help keep your lungs clear and active. This tool measures how well you are filling your lungs with each breath. Taking long deep breaths may help reverse or decrease the chance of developing breathing (pulmonary) problems (especially infection) following: A long period of time when you are unable to move or be active. BEFORE THE PROCEDURE  If the spirometer includes an indicator to show your best effort, your nurse or respiratory therapist will set it to a desired goal. If possible, sit up straight or lean slightly forward. Try not to slouch. Hold the incentive spirometer in an upright position. INSTRUCTIONS FOR USE  Sit on the edge of your bed if possible, or sit up as far as you can in bed or on a chair. Hold the incentive spirometer in an upright position. Breathe out normally. Place the mouthpiece in your mouth and seal your lips tightly around it. Breathe in slowly and as deeply as possible, raising the piston or the ball toward the top of the column. Hold your breath for 3-5 seconds or for as long as possible. Allow the piston or ball to fall  to the bottom of the column. Remove the mouthpiece from your mouth and breathe out normally. Rest for a few seconds and repeat Steps 1 through 7 at least 10 times every 1-2 hours when you are awake. Take your time and take a few normal breaths between deep breaths. The spirometer may include an indicator to show your best effort. Use the indicator as a goal to work toward during each repetition. After each set of 10 deep breaths, practice coughing to be sure your lungs are clear. If you have an incision (the cut made at the time of surgery), support your incision when coughing by placing a pillow or rolled up towels firmly against it. Once you are able to get out of bed, walk around indoors and cough well. You may stop using the incentive spirometer when instructed by your caregiver.  RISKS AND COMPLICATIONS Take your time so you do not get dizzy or light-headed. If you  are in pain, you may need to take or ask for pain medication before doing incentive spirometry. It is harder to take a deep breath if you are having pain. AFTER USE Rest and breathe slowly and easily. It can be helpful to keep track of a log of your progress. Your caregiver can provide you with a simple table to help with this. If you are using the spirometer at home, follow these instructions: [place] IF:  You are having difficultly using the spirometer. You have trouble using the spirometer as often as instructed. Your pain medication is not giving enough relief while using the spirometer. You develop fever of 100.5 F (38.1 C) or higher.                                                                                                    SEEK IMMEDIATE MEDICAL CARE IF:  You cough up bloody sputum that had not been present before. You develop fever of 102 F (38.9 C) or greater. You develop worsening pain at or near the incision site. MAKE SURE YOU:  Understand these instructions. Will watch your condition. Will  get help right away if you are not doing well or get worse. Document Released: 03/27/2007 Document Revised: 02/06/2012 Document Reviewed: 05/28/2007 [facility] Patient Information 2014 [place], [place].       WHAT IS A BLOOD TRANSFUSION? Blood Transfusion Information  A transfusion is the replacement of blood or some of its parts. Blood is made up of multiple cells which provide different functions. Red blood cells carry oxygen and are used for blood loss replacement. White blood cells fight against infection. Platelets control bleeding. Plasma helps clot blood. Other blood products are available for specialized needs, such as hemophilia or other clotting disorders. BEFORE THE TRANSFUSION  Who gives blood for transfusions?  Healthy volunteers who are fully evaluated to make sure their blood is safe. This is blood bank blood. Transfusion therapy is the safest it has ever been in the practice of medicine. Before blood is taken from a donor, a complete history is taken to make sure that person has no history of diseases nor engages in risky social behavior (examples are intravenous drug use or sexual activity with multiple partners). The donor's travel history is screened to minimize risk of transmitting infections, such as malaria. The donated blood is tested for signs of infectious diseases, such as HIV and hepatitis. The blood is then tested to be sure it is compatible with you in order to minimize the chance of a transfusion reaction. If you or a relative donates blood, this is often done in anticipation of surgery and is not appropriate for emergency situations. It takes many days to process the donated blood. RISKS AND COMPLICATIONS Although transfusion therapy is very safe and saves many lives, the main dangers of transfusion include:  Getting an infectious disease. Developing a transfusion reaction. This is an allergic reaction to something in the blood you were given. Every precaution is  taken to prevent this. The decision to have a blood transfusion has been considered carefully by  your caregiver before blood is given. Blood is not given unless the benefits outweigh the risks. AFTER THE TRANSFUSION Right after receiving a blood transfusion, you will usually feel much better and more energetic. This is especially true if your red blood cells have gotten low (anemic). The transfusion raises the level of the red blood cells which carry oxygen, and this usually causes an energy increase. The nurse administering the transfusion will monitor you carefully for complications. HOME CARE INSTRUCTIONS  No special instructions are needed after a transfusion. You may find your energy is better. Speak with your caregiver about any limitations on activity for underlying diseases you may have. SEEK MEDICAL CARE IF:  Your condition is not improving after your transfusion. You develop redness or irritation at the intravenous (IV) site. SEEK IMMEDIATE MEDICAL CARE IF:  Any of the following symptoms occur over the next 12 hours: Shaking chills. You have a temperature by mouth above 102 F (38.9 C), not controlled by medicine. Chest, back, or muscle pain. People around you feel you are not acting correctly or are confused. Shortness of breath or difficulty breathing. Dizziness and fainting. You get a rash or develop hives. You have a decrease in urine output. Your urine turns a dark color or changes to pink, red, or brown. Any of the following symptoms occur over the next 10 days: You have a temperature by mouth above 102 F (38.9 C), not controlled by medicine. Shortness of breath. Weakness after normal activity. The white part of the eye turns yellow (jaundice). You have a decrease in the amount of urine or are urinating less often. Your urine turns a dark color or changes to pink, red, or brown. Document Released: 11/11/2000 Document Revised: 02/06/2012 Document Reviewed:  06/30/2008 [facility] Patient Information 2014 [place], [place].  _______________________________________________________________________

## 2023-02-15 NOTE — Progress Notes (Signed)
COVID Vaccine received:  []  No [x]  Yes Date of any COVID positive Test in last 90 days:  PCP - Scarlette Calico, MD Cardiologist - Glenetta Hew, MD   Chest x-ray - 02-04-2019  2v  Epic EKG -  06-06-22  Epic Stress Test - 2011  ECHO - 2011  epic Cardiac Cath - none Coronary CTA calcium score? 651  on 05-27-2019  Epic  PCR screen: [x]  Ordered & Completed                      []   No Order but Needs PROFEND                      []   N/A for this surgery  Surgery Plan:  []  Ambulatory                            [x]  Outpatient in bed                            []  Admit  Anesthesia:    []  General  [x]  Spinal                           []   Choice []   MAC   Pacemaker / ICD device [x]  No []  Yes   Spinal Cord Stimulator:[x]  No []  Yes       History of Sleep Apnea? []  No [x]  Yes   CPAP used?- [x]  No []  Yes  can't tolerate  Does the patient monitor blood sugar? [x]  No []  Yes  []  N/A  Patient has: [x]  Pre-DM   []  DM1  []   DM2 Does patient have a [employer] or Dexacom? []  No []  Yes   Fasting Blood Sugar Ranges- 95-110 Checks Blood Sugar __0 times a day  GLP1 agonist-  GLP1 instructions:  SGLT-2 inhibitors-  SGLT-2 instructions:   Other Diabetic medications/ instructions:   Blood Thinner / Instructions:  Eliquis ? [facility] Rx Aspirin Instructions:  ASA 81 mg, Hold 5-7 days per Diona Browner, NP 01-04-23 Note  ERAS Protocol Ordered: []  No  [x]  Yes PRE-SURGERY []  ENSURE  [x]  G2  Patient is to be NPO after: 07:15 am  Comments:   Activity level: Patient is able / unable to climb a flight of stairs without difficulty; []  No CP  []  No SOB, but would have ___   Patient can / can not perform ADLs without assistance.   Anesthesia review: OSA- no CPAP, HTN, CM, CHF, PACs, Bradycardia, Pre-DM, GERD, asthma, anemia, CKD3b, HOH  Patient denies shortness of breath, fever, cough and chest pain at PAT appointment.  Patient verbalized understanding and agreement to the Pre-Surgical Instructions  that were given to them at this PAT appointment. Patient was also educated of the need to review these PAT instructions again prior to his surgery.I reviewed the appropriate phone numbers to call if they have any and questions or concerns.

## 2023-02-16 ENCOUNTER — Other Ambulatory Visit: Payer: Self-pay

## 2023-02-16 ENCOUNTER — Encounter (HOSPITAL_COMMUNITY): Payer: Self-pay

## 2023-02-16 ENCOUNTER — Encounter (HOSPITAL_COMMUNITY)
Admission: RE | Admit: 2023-02-16 | Discharge: 2023-02-16 | Disposition: A | Discharge status: Home / Self Care | Payer: BC Managed Care – PPO | Source: Ambulatory Visit | Attending: Orthopedic Surgery | Admitting: Orthopedic Surgery

## 2023-02-16 VITALS — BP 122/60 | HR 52 | Temp 98.3°F | Resp 16 | Ht 69.0 in | Wt 209.0 lb

## 2023-02-16 DIAGNOSIS — Z01812 Encounter for preprocedural laboratory examination: Secondary | ICD-10-CM | POA: Insufficient documentation

## 2023-02-16 DIAGNOSIS — E118 Type 2 diabetes mellitus with unspecified complications: Secondary | ICD-10-CM

## 2023-02-16 DIAGNOSIS — M25561 Pain in right knee: Secondary | ICD-10-CM | POA: Diagnosis not present

## 2023-02-16 DIAGNOSIS — G8929 Other chronic pain: Secondary | ICD-10-CM

## 2023-02-16 DIAGNOSIS — Z01818 Encounter for other preprocedural examination: Secondary | ICD-10-CM

## 2023-02-16 HISTORY — DX: Chronic kidney disease, unspecified: N18.9

## 2023-02-16 HISTORY — DX: Sleep apnea, unspecified: G47.30

## 2023-02-16 HISTORY — DX: Pneumonia, unspecified organism: J18.9

## 2023-02-16 HISTORY — DX: Anemia, unspecified: D64.9

## 2023-02-16 LAB — COMPREHENSIVE METABOLIC PANEL
ALT: 13 U/L (ref 0–44)
AST: 15 U/L (ref 15–41)
Albumin: 4.1 g/dL (ref 3.5–5.0)
Alkaline Phosphatase: 66 U/L (ref 38–126)
Anion gap: 9 (ref 5–15)
BUN: 22 mg/dL (ref 8–23)
CO2: 25 mmol/L (ref 22–32)
Calcium: 8.8 mg/dL — ABNORMAL LOW (ref 8.9–10.3)
Chloride: 101 mmol/L (ref 98–111)
Creatinine, Ser: 1.71 mg/dL — ABNORMAL HIGH (ref 0.61–1.24)
GFR, Estimated: 41 mL/min — ABNORMAL LOW (ref >60)
Glucose, Bld: 104 mg/dL — ABNORMAL HIGH (ref 70–99)
Potassium: 3.6 mmol/L (ref 3.5–5.1)
Sodium: 135 mmol/L (ref 135–145)
Total Bilirubin: 0.7 mg/dL (ref 0.3–1.2)
Total Protein: 7.5 g/dL (ref 6.5–8.1)

## 2023-02-16 LAB — CBC WITH DIFFERENTIAL/PLATELET
Abs Immature Granulocytes: 0.02 10*3/uL (ref 0.00–0.07)
Basophils Absolute: 0.1 10*3/uL (ref 0.0–0.1)
Basophils Relative: 1 %
Eosinophils Absolute: 0.2 10*3/uL (ref 0.0–0.5)
Eosinophils Relative: 3 %
HCT: 35.5 % — ABNORMAL LOW (ref 39.0–52.0)
Hemoglobin: 11.3 g/dL — ABNORMAL LOW (ref 13.0–17.0)
Immature Granulocytes: 0 %
Lymphocytes Relative: 18 %
Lymphs Abs: 1.3 10*3/uL (ref 0.7–4.0)
MCH: 28.9 pg (ref 26.0–34.0)
MCHC: 31.8 g/dL (ref 30.0–36.0)
MCV: 90.8 fL (ref 80.0–100.0)
Monocytes Absolute: 0.9 10*3/uL (ref 0.1–1.0)
Monocytes Relative: 12 %
Neutro Abs: 5 10*3/uL (ref 1.7–7.7)
Neutrophils Relative %: 66 %
Platelets: 263 10*3/uL (ref 150–400)
RBC: 3.91 MIL/uL — ABNORMAL LOW (ref 4.22–5.81)
RDW: 15.5 % (ref 11.5–15.5)
WBC: 7.5 10*3/uL (ref 4.0–10.5)
nRBC: 0 % (ref 0.0–0.2)

## 2023-02-16 LAB — SURGICAL PCR SCREEN
MRSA, PCR: NEGATIVE
Staphylococcus aureus: NEGATIVE

## 2023-02-16 LAB — TYPE AND SCREEN
ABO/RH(D): A POS
Antibody Screen: NEGATIVE

## 2023-02-20 ENCOUNTER — Other Ambulatory Visit: Payer: Self-pay | Admitting: Internal Medicine

## 2023-02-20 DIAGNOSIS — E118 Type 2 diabetes mellitus with unspecified complications: Secondary | ICD-10-CM

## 2023-02-20 DIAGNOSIS — N1832 Chronic kidney disease, stage 3b: Secondary | ICD-10-CM

## 2023-02-20 MED ORDER — KERENDIA 10 MG PO TABS: 1.0000 | ORAL_TABLET | Freq: Every day | ORAL | 5 refills | Status: DC

## 2023-02-25 ENCOUNTER — Other Ambulatory Visit: Payer: Self-pay | Admitting: Cardiology

## 2023-02-27 ENCOUNTER — Inpatient Hospital Stay (HOSPITAL_COMMUNITY)
Admission: AD | Admit: 2023-02-27 | Discharge: 2023-03-01 | DRG: 470 | Disposition: A | Discharge status: Home / Self Care | Payer: BC Managed Care – PPO | Attending: Orthopedic Surgery | Admitting: Orthopedic Surgery

## 2023-02-27 ENCOUNTER — Other Ambulatory Visit: Payer: Self-pay

## 2023-02-27 ENCOUNTER — Ambulatory Visit (HOSPITAL_COMMUNITY): Payer: BC Managed Care – PPO | Admitting: Anesthesiology

## 2023-02-27 ENCOUNTER — Encounter (HOSPITAL_COMMUNITY)
Admission: AD | Disposition: A | Discharge status: Home / Self Care | Payer: Self-pay | Source: Home / Self Care | Attending: Orthopedic Surgery

## 2023-02-27 ENCOUNTER — Ambulatory Visit (HOSPITAL_COMMUNITY): Payer: BC Managed Care – PPO | Admitting: Physician Assistant

## 2023-02-27 ENCOUNTER — Observation Stay (HOSPITAL_COMMUNITY): Payer: BC Managed Care – PPO

## 2023-02-27 ENCOUNTER — Encounter (HOSPITAL_COMMUNITY): Payer: Self-pay | Admitting: Orthopedic Surgery

## 2023-02-27 DIAGNOSIS — Z8249 Family history of ischemic heart disease and other diseases of the circulatory system: Secondary | ICD-10-CM

## 2023-02-27 DIAGNOSIS — R001 Bradycardia, unspecified: Secondary | ICD-10-CM | POA: Diagnosis not present

## 2023-02-27 DIAGNOSIS — K219 Gastro-esophageal reflux disease without esophagitis: Secondary | ICD-10-CM | POA: Diagnosis present

## 2023-02-27 DIAGNOSIS — E118 Type 2 diabetes mellitus with unspecified complications: Secondary | ICD-10-CM

## 2023-02-27 DIAGNOSIS — E890 Postprocedural hypothyroidism: Secondary | ICD-10-CM | POA: Diagnosis present

## 2023-02-27 DIAGNOSIS — Z79890 Hormone replacement therapy (postmenopausal): Secondary | ICD-10-CM

## 2023-02-27 DIAGNOSIS — N401 Enlarged prostate with lower urinary tract symptoms: Secondary | ICD-10-CM | POA: Diagnosis present

## 2023-02-27 DIAGNOSIS — M1711 Unilateral primary osteoarthritis, right knee: Principal | ICD-10-CM | POA: Diagnosis present

## 2023-02-27 DIAGNOSIS — Z96651 Presence of right artificial knee joint: Secondary | ICD-10-CM | POA: Diagnosis not present

## 2023-02-27 DIAGNOSIS — N182 Chronic kidney disease, stage 2 (mild): Secondary | ICD-10-CM | POA: Diagnosis present

## 2023-02-27 DIAGNOSIS — G8918 Other acute postprocedural pain: Secondary | ICD-10-CM | POA: Diagnosis not present

## 2023-02-27 DIAGNOSIS — Z79899 Other long term (current) drug therapy: Secondary | ICD-10-CM

## 2023-02-27 DIAGNOSIS — I129 Hypertensive chronic kidney disease with stage 1 through stage 4 chronic kidney disease, or unspecified chronic kidney disease: Secondary | ICD-10-CM | POA: Diagnosis present

## 2023-02-27 DIAGNOSIS — Z7901 Long term (current) use of anticoagulants: Secondary | ICD-10-CM

## 2023-02-27 DIAGNOSIS — E1122 Type 2 diabetes mellitus with diabetic chronic kidney disease: Secondary | ICD-10-CM | POA: Diagnosis present

## 2023-02-27 DIAGNOSIS — R0789 Other chest pain: Secondary | ICD-10-CM | POA: Diagnosis not present

## 2023-02-27 DIAGNOSIS — Z96652 Presence of left artificial knee joint: Secondary | ICD-10-CM | POA: Diagnosis present

## 2023-02-27 DIAGNOSIS — Z471 Aftercare following joint replacement surgery: Secondary | ICD-10-CM | POA: Diagnosis not present

## 2023-02-27 DIAGNOSIS — J4520 Mild intermittent asthma, uncomplicated: Secondary | ICD-10-CM | POA: Diagnosis present

## 2023-02-27 DIAGNOSIS — E1169 Type 2 diabetes mellitus with other specified complication: Secondary | ICD-10-CM | POA: Diagnosis present

## 2023-02-27 DIAGNOSIS — E785 Hyperlipidemia, unspecified: Secondary | ICD-10-CM | POA: Diagnosis present

## 2023-02-27 HISTORY — PX: TOTAL KNEE ARTHROPLASTY: SHX125

## 2023-02-27 LAB — GLUCOSE, CAPILLARY
Glucose-Capillary: 103 mg/dL — ABNORMAL HIGH (ref 70–99)
Glucose-Capillary: 114 mg/dL — ABNORMAL HIGH (ref 70–99)
Glucose-Capillary: 149 mg/dL — ABNORMAL HIGH (ref 70–99)

## 2023-02-27 SURGERY — ARTHROPLASTY, KNEE, TOTAL
Anesthesia: Spinal | Site: Knee | Laterality: Right

## 2023-02-27 MED ORDER — SODIUM CHLORIDE 0.9 % IR SOLN
Status: DC | PRN
  Administered 2023-02-27: 3000 mL

## 2023-02-27 MED ORDER — PHENYLEPHRINE HCL-NACL 20-0.9 MG/250ML-% IV SOLN
INTRAVENOUS | Status: DC | PRN
  Administered 2023-02-27: 20 ug/min via INTRAVENOUS

## 2023-02-27 MED ORDER — POVIDONE-IODINE 10 % EX SWAB
2.0000 | Freq: Once | CUTANEOUS | Status: AC
  Administered 2023-02-27: 2 via TOPICAL

## 2023-02-27 MED ORDER — ATORVASTATIN CALCIUM 40 MG PO TABS
40.0000 mg | ORAL_TABLET | Freq: Every day | ORAL | Status: DC
  Administered 2023-02-28 – 2023-03-01 (×2): 40 mg via ORAL
  Filled 2023-02-27 (×2): qty 1

## 2023-02-27 MED ORDER — ONDANSETRON HCL 4 MG/2ML IJ SOLN
INTRAMUSCULAR | Status: AC
  Filled 2023-02-27: qty 2

## 2023-02-27 MED ORDER — CEFAZOLIN SODIUM-DEXTROSE 2-4 GM/100ML-% IV SOLN
2.0000 g | INTRAVENOUS | Status: AC
  Administered 2023-02-27: 2 g via INTRAVENOUS
  Filled 2023-02-27: qty 100

## 2023-02-27 MED ORDER — PROPOFOL 10 MG/ML IV BOLUS
INTRAVENOUS | Status: DC | PRN
  Administered 2023-02-27: 40 mg via INTRAVENOUS

## 2023-02-27 MED ORDER — 0.9 % SODIUM CHLORIDE (POUR BTL) OPTIME
TOPICAL | Status: DC | PRN
  Administered 2023-02-27: 1000 mL

## 2023-02-27 MED ORDER — HYDRALAZINE HCL 25 MG PO TABS
25.0000 mg | ORAL_TABLET | Freq: Three times a day (TID) | ORAL | Status: DC
  Administered 2023-02-27 – 2023-03-01 (×5): 25 mg via ORAL
  Filled 2023-02-27 (×6): qty 1

## 2023-02-27 MED ORDER — ACETAMINOPHEN 500 MG PO TABS
1000.0000 mg | ORAL_TABLET | Freq: Once | ORAL | Status: AC
  Administered 2023-02-27: 1000 mg via ORAL
  Filled 2023-02-27: qty 2

## 2023-02-27 MED ORDER — DEXAMETHASONE SODIUM PHOSPHATE 10 MG/ML IJ SOLN
INTRAMUSCULAR | Status: DC | PRN
  Administered 2023-02-27: 10 mg via INTRAVENOUS

## 2023-02-27 MED ORDER — PROPOFOL 500 MG/50ML IV EMUL
INTRAVENOUS | Status: AC
  Filled 2023-02-27: qty 50

## 2023-02-27 MED ORDER — ACETAMINOPHEN 10 MG/ML IV SOLN: 1000.0000 mg | Freq: Once | INTRAVENOUS | Status: DC | PRN

## 2023-02-27 MED ORDER — WATER FOR IRRIGATION, STERILE IR SOLN
Status: DC | PRN
  Administered 2023-02-27: 2000 mL

## 2023-02-27 MED ORDER — LIDOCAINE HCL (PF) 2 % IJ SOLN
INTRAMUSCULAR | Status: AC
  Filled 2023-02-27: qty 5

## 2023-02-27 MED ORDER — FINERENONE 10 MG PO TABS
1.0000 | ORAL_TABLET | Freq: Every day | ORAL | Status: DC
  Administered 2023-02-28 – 2023-03-01 (×2): 10 mg via ORAL

## 2023-02-27 MED ORDER — ONDANSETRON HCL 4 MG PO TABS: 4.0000 mg | ORAL_TABLET | Freq: Four times a day (QID) | ORAL | Status: DC | PRN

## 2023-02-27 MED ORDER — ORAL CARE MOUTH RINSE: 15.0000 mL | Freq: Once | OROMUCOSAL | Status: AC

## 2023-02-27 MED ORDER — BUPIVACAINE LIPOSOME 1.3 % IJ SUSP: 20.0000 mL | Freq: Once | INTRAMUSCULAR | Status: AC

## 2023-02-27 MED ORDER — AMISULPRIDE (ANTIEMETIC) 5 MG/2ML IV SOLN: 10.0000 mg | Freq: Once | INTRAVENOUS | Status: DC | PRN

## 2023-02-27 MED ORDER — DOCUSATE SODIUM 100 MG PO CAPS
100.0000 mg | ORAL_CAPSULE | Freq: Two times a day (BID) | ORAL | Status: DC
  Administered 2023-02-27 – 2023-03-01 (×4): 100 mg via ORAL
  Filled 2023-02-27 (×4): qty 1

## 2023-02-27 MED ORDER — ISOPROPYL ALCOHOL 70 % SOLN
Status: DC | PRN
  Administered 2023-02-27: 1 via TOPICAL

## 2023-02-27 MED ORDER — LACTATED RINGERS IV SOLN: INTRAVENOUS | Status: DC

## 2023-02-27 MED ORDER — SODIUM CHLORIDE 0.9% FLUSH
INTRAVENOUS | Status: DC | PRN
  Administered 2023-02-27: 60 mL

## 2023-02-27 MED ORDER — BUPIVACAINE LIPOSOME 1.3 % IJ SUSP
INTRAMUSCULAR | Status: AC
  Filled 2023-02-27: qty 20

## 2023-02-27 MED ORDER — SODIUM CHLORIDE (PF) 0.9 % IJ SOLN
INTRAMUSCULAR | Status: AC
  Filled 2023-02-27: qty 50

## 2023-02-27 MED ORDER — PANTOPRAZOLE SODIUM 40 MG PO TBEC
40.0000 mg | DELAYED_RELEASE_TABLET | Freq: Every day | ORAL | Status: DC
  Administered 2023-02-27 – 2023-03-01 (×3): 40 mg via ORAL
  Filled 2023-02-27 (×4): qty 1

## 2023-02-27 MED ORDER — PROPOFOL 500 MG/50ML IV EMUL
INTRAVENOUS | Status: DC | PRN
  Administered 2023-02-27: 85 ug/kg/min via INTRAVENOUS

## 2023-02-27 MED ORDER — ACETAMINOPHEN 160 MG/5ML PO SOLN: 325.0000 mg | Freq: Once | ORAL | Status: DC | PRN

## 2023-02-27 MED ORDER — BUPIVACAINE LIPOSOME 1.3 % IJ SUSP
INTRAMUSCULAR | Status: DC | PRN
  Administered 2023-02-27: 20 mL

## 2023-02-27 MED ORDER — CHLORHEXIDINE GLUCONATE 0.12 % MT SOLN
15.0000 mL | Freq: Once | OROMUCOSAL | Status: AC
  Administered 2023-02-27: 15 mL via OROMUCOSAL

## 2023-02-27 MED ORDER — DEXAMETHASONE SODIUM PHOSPHATE 10 MG/ML IJ SOLN
INTRAMUSCULAR | Status: AC
  Filled 2023-02-27: qty 1

## 2023-02-27 MED ORDER — ALBUTEROL SULFATE (2.5 MG/3ML) 0.083% IN NEBU: 3.0000 mL | INHALATION_SOLUTION | Freq: Four times a day (QID) | RESPIRATORY_TRACT | Status: DC | PRN

## 2023-02-27 MED ORDER — PHENYLEPHRINE HCL (PRESSORS) 10 MG/ML IV SOLN
INTRAVENOUS | Status: AC
  Filled 2023-02-27: qty 1

## 2023-02-27 MED ORDER — SODIUM CHLORIDE 0.9 % IV SOLN: INTRAVENOUS | Status: DC

## 2023-02-27 MED ORDER — ROPIVACAINE HCL 5 MG/ML IJ SOLN
INTRAMUSCULAR | Status: DC | PRN
  Administered 2023-02-27: 30 mL via PERINEURAL

## 2023-02-27 MED ORDER — METOPROLOL TARTRATE 12.5 MG HALF TABLET
12.5000 mg | ORAL_TABLET | Freq: Two times a day (BID) | ORAL | Status: DC
  Administered 2023-02-27 – 2023-03-01 (×3): 12.5 mg via ORAL
  Filled 2023-02-27 (×4): qty 1

## 2023-02-27 MED ORDER — ACETAMINOPHEN 325 MG PO TABS: 325.0000 mg | ORAL_TABLET | Freq: Once | ORAL | Status: DC | PRN

## 2023-02-27 MED ORDER — FLUTICASONE PROPIONATE 50 MCG/ACT NA SUSP: 2.0000 | Freq: Every day | NASAL | Status: DC | PRN

## 2023-02-27 MED ORDER — CEFAZOLIN SODIUM-DEXTROSE 2-4 GM/100ML-% IV SOLN
2.0000 g | Freq: Four times a day (QID) | INTRAVENOUS | Status: AC
  Administered 2023-02-27 (×2): 2 g via INTRAVENOUS
  Filled 2023-02-27 (×2): qty 100

## 2023-02-27 MED ORDER — LORATADINE 10 MG PO TABS
10.0000 mg | ORAL_TABLET | Freq: Every day | ORAL | Status: DC
  Administered 2023-02-28 – 2023-03-01 (×2): 10 mg via ORAL
  Filled 2023-02-27 (×2): qty 1

## 2023-02-27 MED ORDER — MIDAZOLAM HCL 2 MG/2ML IJ SOLN
1.0000 mg | INTRAMUSCULAR | Status: DC
  Filled 2023-02-27: qty 2

## 2023-02-27 MED ORDER — AMLODIPINE BESYLATE 10 MG PO TABS
10.0000 mg | ORAL_TABLET | Freq: Every day | ORAL | Status: DC
  Administered 2023-03-01: 10 mg via ORAL
  Filled 2023-02-27 (×2): qty 1

## 2023-02-27 MED ORDER — ACETAMINOPHEN 500 MG PO TABS
1000.0000 mg | ORAL_TABLET | Freq: Four times a day (QID) | ORAL | Status: AC
  Administered 2023-02-27 – 2023-02-28 (×4): 1000 mg via ORAL
  Filled 2023-02-27 (×4): qty 2

## 2023-02-27 MED ORDER — POLYETHYLENE GLYCOL 3350 17 G PO PACK: 17.0000 g | PACK | Freq: Every day | ORAL | Status: DC | PRN

## 2023-02-27 MED ORDER — LEVOTHYROXINE SODIUM 112 MCG PO TABS
112.0000 ug | ORAL_TABLET | Freq: Every day | ORAL | Status: DC
  Administered 2023-02-28 – 2023-03-01 (×2): 112 ug via ORAL
  Filled 2023-02-27 (×2): qty 1

## 2023-02-27 MED ORDER — EPHEDRINE 5 MG/ML INJ
INTRAVENOUS | Status: AC
  Filled 2023-02-27: qty 5

## 2023-02-27 MED ORDER — PROPOFOL 1000 MG/100ML IV EMUL
INTRAVENOUS | Status: AC
  Filled 2023-02-27: qty 100

## 2023-02-27 MED ORDER — METHOCARBAMOL 500 MG IVPB - SIMPLE MED
INTRAVENOUS | Status: AC
  Filled 2023-02-27: qty 55

## 2023-02-27 MED ORDER — BUPIVACAINE IN DEXTROSE 0.75-8.25 % IT SOLN
INTRATHECAL | Status: DC | PRN
  Administered 2023-02-27: 1.8 mL via INTRATHECAL

## 2023-02-27 MED ORDER — APIXABAN 2.5 MG PO TABS
2.5000 mg | ORAL_TABLET | Freq: Two times a day (BID) | ORAL | Status: DC
  Administered 2023-02-28 – 2023-03-01 (×3): 2.5 mg via ORAL
  Filled 2023-02-27 (×3): qty 1

## 2023-02-27 MED ORDER — INSULIN ASPART 100 UNIT/ML IJ SOLN
0.0000 [IU] | Freq: Three times a day (TID) | INTRAMUSCULAR | Status: DC
  Administered 2023-03-01: 2 [IU] via SUBCUTANEOUS

## 2023-02-27 MED ORDER — ONDANSETRON HCL 4 MG/2ML IJ SOLN: 4.0000 mg | Freq: Four times a day (QID) | INTRAMUSCULAR | Status: DC | PRN

## 2023-02-27 MED ORDER — PROMETHAZINE HCL 25 MG/ML IJ SOLN: 6.2500 mg | INTRAMUSCULAR | Status: DC | PRN

## 2023-02-27 MED ORDER — METHOCARBAMOL 500 MG PO TABS
500.0000 mg | ORAL_TABLET | Freq: Four times a day (QID) | ORAL | Status: DC | PRN
  Administered 2023-02-27 – 2023-03-01 (×3): 500 mg via ORAL
  Filled 2023-02-27 (×3): qty 1

## 2023-02-27 MED ORDER — MENTHOL 3 MG MT LOZG: 1.0000 | LOZENGE | OROMUCOSAL | Status: DC | PRN

## 2023-02-27 MED ORDER — PHENOL 1.4 % MT LIQD: 1.0000 | OROMUCOSAL | Status: DC | PRN

## 2023-02-27 MED ORDER — FENTANYL CITRATE PF 50 MCG/ML IJ SOSY
50.0000 ug | PREFILLED_SYRINGE | INTRAMUSCULAR | Status: AC
  Administered 2023-02-27: 50 ug via INTRAVENOUS
  Filled 2023-02-27: qty 2

## 2023-02-27 MED ORDER — IRBESARTAN 150 MG PO TABS
300.0000 mg | ORAL_TABLET | Freq: Every day | ORAL | Status: DC
  Administered 2023-03-01: 300 mg via ORAL
  Filled 2023-02-27 (×2): qty 2

## 2023-02-27 MED ORDER — DIPHENHYDRAMINE HCL 12.5 MG/5ML PO ELIX: 12.5000 mg | ORAL_SOLUTION | ORAL | Status: DC | PRN

## 2023-02-27 MED ORDER — HYDROMORPHONE HCL 1 MG/ML IJ SOLN: 0.2500 mg | INTRAMUSCULAR | Status: DC | PRN

## 2023-02-27 MED ORDER — HYDROMORPHONE HCL 1 MG/ML IJ SOLN
0.5000 mg | INTRAMUSCULAR | Status: DC | PRN
  Administered 2023-02-27 – 2023-02-28 (×2): 0.5 mg via INTRAVENOUS
  Administered 2023-02-28: 1 mg via INTRAVENOUS
  Filled 2023-02-27 (×3): qty 1

## 2023-02-27 MED ORDER — ONDANSETRON HCL 4 MG/2ML IJ SOLN
INTRAMUSCULAR | Status: DC | PRN
  Administered 2023-02-27: 4 mg via INTRAVENOUS

## 2023-02-27 MED ORDER — SODIUM CHLORIDE (PF) 0.9 % IJ SOLN
INTRAMUSCULAR | Status: AC
  Filled 2023-02-27: qty 10

## 2023-02-27 MED ORDER — METHOCARBAMOL 500 MG IVPB - SIMPLE MED
500.0000 mg | Freq: Four times a day (QID) | INTRAVENOUS | Status: DC | PRN
  Administered 2023-02-27: 500 mg via INTRAVENOUS

## 2023-02-27 MED ORDER — LIDOCAINE HCL (CARDIAC) PF 50 MG/5ML IV SOSY
PREFILLED_SYRINGE | INTRAVENOUS | Status: DC | PRN
  Administered 2023-02-27: 50 mg via INTRAVENOUS

## 2023-02-27 MED ORDER — ACETAMINOPHEN 325 MG PO TABS
325.0000 mg | ORAL_TABLET | Freq: Four times a day (QID) | ORAL | Status: DC | PRN
  Administered 2023-03-01: 650 mg via ORAL
  Filled 2023-02-27: qty 2

## 2023-02-27 MED ORDER — INDAPAMIDE 1.25 MG PO TABS
1.2500 mg | ORAL_TABLET | Freq: Every day | ORAL | Status: DC
  Administered 2023-03-01: 1.25 mg via ORAL
  Filled 2023-02-27 (×2): qty 1

## 2023-02-27 MED ORDER — TRANEXAMIC ACID-NACL 1000-0.7 MG/100ML-% IV SOLN
1000.0000 mg | INTRAVENOUS | Status: AC
  Administered 2023-02-27: 1000 mg via INTRAVENOUS
  Filled 2023-02-27: qty 100

## 2023-02-27 MED ORDER — EPHEDRINE SULFATE (PRESSORS) 50 MG/ML IJ SOLN
INTRAMUSCULAR | Status: DC | PRN
  Administered 2023-02-27 (×3): 5 mg via INTRAVENOUS

## 2023-02-27 MED ORDER — GLYCOPYRROLATE 0.2 MG/ML IJ SOLN
INTRAMUSCULAR | Status: DC | PRN
  Administered 2023-02-27: .2 mg via INTRAVENOUS

## 2023-02-27 MED ORDER — TAMSULOSIN HCL 0.4 MG PO CAPS
0.4000 mg | ORAL_CAPSULE | Freq: Every day | ORAL | Status: DC
  Administered 2023-02-28 – 2023-03-01 (×2): 0.4 mg via ORAL
  Filled 2023-02-27 (×2): qty 1

## 2023-02-27 MED ORDER — OXYCODONE HCL 5 MG PO TABS
5.0000 mg | ORAL_TABLET | ORAL | Status: DC | PRN
  Administered 2023-02-27: 10 mg via ORAL
  Administered 2023-02-27: 5 mg via ORAL
  Administered 2023-02-28: 10 mg via ORAL
  Administered 2023-02-28: 5 mg via ORAL
  Filled 2023-02-27: qty 1
  Filled 2023-02-27 (×3): qty 2

## 2023-02-27 MED ORDER — DAPAGLIFLOZIN PROPANEDIOL 10 MG PO TABS
10.0000 mg | ORAL_TABLET | Freq: Every day | ORAL | Status: DC
  Administered 2023-02-28 – 2023-03-01 (×2): 10 mg via ORAL
  Filled 2023-02-27 (×2): qty 1

## 2023-02-27 SURGICAL SUPPLY — 66 items
ADH SKN CLS APL DERMABOND .7 (GAUZE/BANDAGES/DRESSINGS) ×2
APL PRP STRL LF DISP 70% ISPRP (MISCELLANEOUS) ×2
BLADE SAG 18X100X1.27 (BLADE) ×1 IMPLANT
BLADE SAW SAG 35X64 .89 (BLADE) ×1 IMPLANT
BNDG CMPR 5X3 CHSV STRCH STRL (GAUZE/BANDAGES/DRESSINGS) ×1
BNDG CMPR 5X62 HK CLSR LF (GAUZE/BANDAGES/DRESSINGS) ×1
BNDG CMPR 6 X 5 YARDS HK CLSR (GAUZE/BANDAGES/DRESSINGS) ×1
BNDG CMPR MED 10X6 ELC LF (GAUZE/BANDAGES/DRESSINGS) ×1
BNDG COHESIVE 3X5 TAN ST LF (GAUZE/BANDAGES/DRESSINGS) ×1 IMPLANT
BNDG ELASTIC 6INX 5YD STR LF (GAUZE/BANDAGES/DRESSINGS) IMPLANT
BNDG ELASTIC 6X10 VLCR STRL LF (GAUZE/BANDAGES/DRESSINGS) ×1 IMPLANT
BOWL SMART MIX CTS (DISPOSABLE) ×1 IMPLANT
BSPLAT TIB 5D F CMNT STM RT (Knees) ×1 IMPLANT
CEMENT BONE R 1X40 (Cement) IMPLANT
CEMENT BONE REFOBACIN R1X40 US (Cement) IMPLANT
CHLORAPREP W/TINT 26 (MISCELLANEOUS) ×2 IMPLANT
COMP FEM CMT PERSONA SZ9 RT (Joint) ×1 IMPLANT
COMPONENT FEM CMT PRSONA SZ9RT (Joint) IMPLANT
COVER SURGICAL LIGHT HANDLE (MISCELLANEOUS) ×1 IMPLANT
CUFF TOURN SGL QUICK 34 (TOURNIQUET CUFF) ×1
CUFF TRNQT CYL 34X4.125X (TOURNIQUET CUFF) ×1 IMPLANT
DERMABOND ADVANCED .7 DNX12 (GAUZE/BANDAGES/DRESSINGS) ×1 IMPLANT
DRAPE INCISE IOBAN 85X60 (DRAPES) ×1 IMPLANT
DRAPE SHEET LG 3/4 BI-LAMINATE (DRAPES) ×1 IMPLANT
DRAPE U-SHAPE 47X51 STRL (DRAPES) ×1 IMPLANT
DRESSING AQUACEL AG SP 3.5X10 (GAUZE/BANDAGES/DRESSINGS) ×1 IMPLANT
DRSG AQUACEL AG SP 3.5X10 (GAUZE/BANDAGES/DRESSINGS) ×1
ELECT REM PT RETURN 15FT ADLT (MISCELLANEOUS) ×1 IMPLANT
GAUZE SPONGE 4X4 12PLY STRL (GAUZE/BANDAGES/DRESSINGS) ×1 IMPLANT
GLOVE BIO SURGEON STRL SZ 6.5 (GLOVE) ×2 IMPLANT
GLOVE BIOGEL PI IND STRL 6.5 (GLOVE) ×1 IMPLANT
GLOVE BIOGEL PI IND STRL 8 (GLOVE) ×1 IMPLANT
GLOVE SURG ORTHO 8.0 STRL STRW (GLOVE) ×2 IMPLANT
GOWN STRL REUS W/ TWL XL LVL3 (GOWN DISPOSABLE) ×2 IMPLANT
GOWN STRL REUS W/TWL XL LVL3 (GOWN DISPOSABLE) ×2
HANDPIECE INTERPULSE COAX TIP (DISPOSABLE) ×1
HDLS TROCR DRIL PIN KNEE 75 (PIN) ×1
HOLDER FOLEY CATH W/STRAP (MISCELLANEOUS) ×1 IMPLANT
HOOD PEEL AWAY T7 (MISCELLANEOUS) ×3 IMPLANT
INSERT TIB ARTI SZ8-11 RT 11 (Joint) IMPLANT
KIT TURNOVER KIT A (KITS) IMPLANT
MANIFOLD NEPTUNE II (INSTRUMENTS) ×1 IMPLANT
MARKER SKIN DUAL TIP RULER LAB (MISCELLANEOUS) ×1 IMPLANT
NS IRRIG 1000ML POUR BTL (IV SOLUTION) ×1 IMPLANT
PACK TOTAL KNEE CUSTOM (KITS) ×1 IMPLANT
PIN DRILL HDLS TROCAR 75 4PK (PIN) IMPLANT
SCREW HEADED 33MM KNEE (MISCELLANEOUS) IMPLANT
SET HNDPC FAN SPRY TIP SCT (DISPOSABLE) ×1 IMPLANT
SPIKE FLUID TRANSFER (MISCELLANEOUS) ×1 IMPLANT
STEM POLY PAT PLY 32M KNEE (Knees) IMPLANT
STEM TIB ST PERS 14+30 (Stem) IMPLANT
STEM TIBIA 5 DEG SZ F R KNEE (Knees) IMPLANT
STRIP CLOSURE SKIN 1/2X4 (GAUZE/BANDAGES/DRESSINGS) ×1 IMPLANT
SUT MNCRL AB 3-0 PS2 18 (SUTURE) ×1 IMPLANT
SUT STRATAFIX 0 PDS 27 VIOLET (SUTURE) ×1
SUT STRATAFIX PDO 1 14 VIOLET (SUTURE) ×1
SUT STRATFX PDO 1 14 VIOLET (SUTURE) ×1
SUT VIC AB 2-0 CT2 27 (SUTURE) ×2 IMPLANT
SUTURE STRATFX 0 PDS 27 VIOLET (SUTURE) ×1 IMPLANT
SUTURE STRATFX PDO 1 14 VIOLET (SUTURE) ×1 IMPLANT
SYR 50ML LL SCALE MARK (SYRINGE) ×1 IMPLANT
TIBIA STEM 5 DEG SZ F R KNEE (Knees) ×1 IMPLANT
TRAY FOLEY MTR SLVR 14FR STAT (SET/KITS/TRAYS/PACK) IMPLANT
TUBE SUCTION HIGH CAP CLEAR NV (SUCTIONS) ×1 IMPLANT
UNDERPAD 30X36 HEAVY ABSORB (UNDERPADS AND DIAPERS) ×1 IMPLANT
WRAP KNEE MAXI GEL POST OP (GAUZE/BANDAGES/DRESSINGS) IMPLANT

## 2023-02-27 NOTE — Anesthesia Procedure Notes (Signed)
Anesthesia Regional Block: Adductor canal block   Pre-Anesthetic Checklist: , timeout performed,  Correct Patient, Correct Site, Correct Laterality,  Correct Procedure, Correct Position, site marked,  Risks and benefits discussed,  Surgical consent,  Pre-op evaluation,  At surgeon's request and post-op pain management  Laterality: Right  Prep: chloraprep       Needles:  Injection technique: Single-shot  Needle Type: Echogenic Stimulator Needle     Needle Length: 9cm  Needle Gauge: 21     Additional Needles:   Procedures:,,,, ultrasound used (permanent image in chart),,    Narrative:  Start time: 02/27/2023 10:10 AM End time: 02/27/2023 10:15 AM Injection made incrementally with aspirations every 5 mL.  Performed by: Personally  Anesthesiologist: Effie Berkshire, MD  Additional Notes: Discussed risks and benefits of the nerve block in detail, including but not limited vascular injury, permanent nerve damage and infection.   Patient tolerated the procedure well. Local anesthetic introduced in an incremental fashion under minimal resistance after negative aspirations. No paresthesias were elicited. After completion of the procedure, no acute issues were identified and patient continued to be monitored by RN.

## 2023-02-27 NOTE — Interval H&P Note (Signed)
The patient has been re-examined, and the chart reviewed, and there have been no interval changes to the documented history and physical.    Plan for R TKA for R knee OA  The operative side was examined and the patient was confirmed to have sensation to DPN, SPN, TN intact, Motor EHL, ext, flex 5/5, and DP 2+, PT 2+, No significant edema.   The risks, benefits, and alternatives have been discussed at length with patient, and the patient is willing to proceed.  Right knee marked. Consent has been signed.  

## 2023-02-27 NOTE — Discharge Instructions (Signed)
INSTRUCTIONS AFTER JOINT REPLACEMENT   Remove items at home which could result in a fall. This includes throw rugs or furniture in walking pathways ICE to the affected joint every three hours while awake for 30 minutes at a time, for at least the first 3-5 days, and then as needed for pain and swelling.  Continue to use ice for pain and swelling. You may notice swelling that will progress down to the foot and ankle.  This is normal after surgery.  Elevate your leg when you are not up walking on it.   Continue to use the breathing machine you got in the hospital (incentive spirometer) which will help keep your temperature down.  It is common for your temperature to cycle up and down following surgery, especially at night when you are not up moving around and exerting yourself.  The breathing machine keeps your lungs expanded and your temperature down.   DIET:  As you were doing prior to hospitalization, we recommend a well-balanced diet.  DRESSING / WOUND CARE / SHOWERING  Keep the surgical dressing until follow up.  The dressing is water proof, so you can shower without any extra covering.  IF THE DRESSING FALLS OFF or the wound gets wet inside, change the dressing with sterile gauze.  Please use good hand washing techniques before changing the dressing.  Do not use any lotions or creams on the incision until instructed by your surgeon.    ACTIVITY  Increase activity slowly as tolerated, but follow the weight bearing instructions below.   No driving for 6 weeks or until further direction given by your physician.  You cannot drive while taking narcotics.  No lifting or carrying greater than 10 lbs. until further directed by your surgeon. Avoid periods of inactivity such as sitting longer than an hour when not asleep. This helps prevent blood clots.  You may return to work once you are authorized by your doctor.     WEIGHT BEARING   Weight bearing as tolerated with assist device (walker, cane,  etc) as directed, use it as long as suggested by your surgeon or therapist, typically at least 4-6 weeks.   EXERCISES  Results after joint replacement surgery are often greatly improved when you follow the exercise, range of motion and muscle strengthening exercises prescribed by your doctor. Safety measures are also important to protect the joint from further injury. Any time any of these exercises cause you to have increased pain or swelling, decrease what you are doing until you are comfortable again and then slowly increase them. If you have problems or questions, call your caregiver or physical therapist for advice.   Rehabilitation is important following a joint replacement. After just a few days of immobilization, the muscles of the leg can become weakened and shrink (atrophy).  These exercises are designed to build up the tone and strength of the thigh and leg muscles and to improve motion. Often times heat used for twenty to thirty minutes before working out will loosen up your tissues and help with improving the range of motion but do not use heat for the first two weeks following surgery (sometimes heat can increase post-operative swelling).   These exercises can be done on a training (exercise) mat, on the floor, on a table or on a bed. Use whatever works the best and is most comfortable for you.    Use music or television while you are exercising so that the exercises are a pleasant break in your   day. This will make your life better with the exercises acting as a break in your routine that you can look forward to.   Perform all exercises about fifteen times, three times per day or as directed.  You should exercise both the operative leg and the other leg as well.  Exercises include:   Quad Sets - Tighten up the muscle on the front of the thigh (Quad) and hold for 5-10 seconds.   Straight Leg Raises - With your knee straight (if you were given a brace, keep it on), lift the leg to 60  degrees, hold for 3 seconds, and slowly lower the leg.  Perform this exercise against resistance later as your leg gets stronger.  Leg Slides: Lying on your back, slowly slide your foot toward your buttocks, bending your knee up off the floor (only go as far as is comfortable). Then slowly slide your foot back down until your leg is flat on the floor again.  Angel Wings: Lying on your back spread your legs to the side as far apart as you can without causing discomfort.  Hamstring Strength:  Lying on your back, push your heel against the floor with your leg straight by tightening up the muscles of your buttocks.  Repeat, but this time bend your knee to a comfortable angle, and push your heel against the floor.  You may put a pillow under the heel to make it more comfortable if necessary.   A rehabilitation program following joint replacement surgery can speed recovery and prevent re-injury in the future due to weakened muscles. Contact your doctor or a physical therapist for more information on knee rehabilitation.    CONSTIPATION  Constipation is defined medically as fewer than three stools per week and severe constipation as less than one stool per week.  Even if you have a regular bowel pattern at home, your normal regimen is likely to be disrupted due to multiple reasons following surgery.  Combination of anesthesia, postoperative narcotics, change in appetite and fluid intake all can affect your bowels.   YOU MUST use at least one of the following options; they are listed in order of increasing strength to get the job done.  They are all available over the counter, and you may need to use some, POSSIBLY even all of these options:    Drink plenty of fluids (prune juice may be helpful) and high fiber foods Colace 100 mg by mouth twice a day  Senokot for constipation as directed and as needed Dulcolax (bisacodyl), take with full glass of water  Miralax (polyethylene glycol) once or twice a day as  needed.  If you have tried all these things and are unable to have a bowel movement in the first 3-4 days after surgery call either your surgeon or your primary doctor.    If you experience loose stools or diarrhea, hold the medications until you stool forms back up.  If your symptoms do not get better within 1 week or if they get worse, check with your doctor.  If you experience "the worst abdominal pain ever" or develop nausea or vomiting, please contact the office immediately for further recommendations for treatment.   ITCHING:  If you experience itching with your medications, try taking only a single pain pill, or even half a pain pill at a time.  You can also use Benadryl over the counter for itching or also to help with sleep.   TED HOSE STOCKINGS:  Use stockings on both   legs until for at least 2 weeks or as directed by physician office. They may be removed at night for sleeping.  MEDICATIONS:  See your medication summary on the "After Visit Summary" that nursing will review with you.  You may have some home medications which will be placed on hold until you complete the course of blood thinner medication.  It is important for you to complete the blood thinner medication as prescribed.   Blood clot prevention (DVT Prophylaxis): After surgery you are at an increased risk for a blood clot. you were prescribed a blood thinner, Eliquis, to be taken daily for a total of 4 weeks from surgery to help reduce your risk of getting a blood clot. You may then continue your regular dosage.  This will help prevent a blood clot. Signs of a pulmonary embolus (blood clot in the lungs) include sudden short of breath, feeling lightheaded or dizzy, chest pain with a deep breath, rapid pulse rapid breathing. Signs of a blood clot in your arms or legs include new unexplained swelling and cramping, warm, red or darkened skin around the painful area. Please call the office or 911 right away if these signs or symptoms  develop.  PRECAUTIONS:  If you experience chest pain or shortness of breath - call 911 immediately for transfer to the hospital emergency department.   If you develop a fever greater that 101 F, purulent drainage from wound, increased redness or drainage from wound, foul odor from the wound/dressing, or calf pain - CONTACT YOUR SURGEON.                                                   FOLLOW-UP APPOINTMENTS:  If you do not already have a post-op appointment, please call the office for an appointment to be seen by your surgeon.  Guidelines for how soon to be seen are listed in your "After Visit Summary", but are typically between 2-3 weeks after surgery.  OTHER INSTRUCTIONS:   Knee Replacement:  Do not place pillow under knee, focus on keeping the knee straight while resting. CPM instructions: 0-90 degrees, 2 hours in the morning, 2 hours in the afternoon, and 2 hours in the evening. Place foam block, curve side up under heel at all times except when in CPM or when walking.  DO NOT modify, tear, cut, or change the foam block in any way.  POST-OPERATIVE OPIOID TAPER INSTRUCTIONS: It is important to wean off of your opioid medication as soon as possible. If you do not need pain medication after your surgery it is ok to stop day one. Opioids include: Codeine, Hydrocodone(Norco, Vicodin), Oxycodone(Percocet, oxycontin) and hydromorphone amongst others.  Long term and even short term use of opiods can cause: Increased pain response Dependence Constipation Depression Respiratory depression And more.  Withdrawal symptoms can include Flu like symptoms Nausea, vomiting And more Techniques to manage these symptoms Hydrate well Eat regular healthy meals Stay active Use relaxation techniques(deep breathing, meditating, yoga) Do Not substitute Alcohol to help with tapering If you have been on opioids for less than two weeks and do not have pain than it is ok to stop all together.  Plan to wean  off of opioids This plan should start within one week post op of your joint replacement. Maintain the same interval or time between taking each dose and first  decrease the dose.  Cut the total daily intake of opioids by one tablet each day Next start to increase the time between doses. The last dose that should be eliminated is the evening dose.   MAKE SURE YOU:  Understand these instructions.  Get help right away if you are not doing well or get worse.    Thank you for letting us be a part of your medical care team.  It is a privilege we respect greatly.  We hope these instructions will help you stay on track for a fast and full recovery!      Information on my medicine - ELIQUIS (apixaban)  This medication education was reviewed with me or my healthcare representative as part of my discharge preparation.    Why was Eliquis prescribed for you? Eliquis was prescribed for you to reduce the risk of blood clots forming after orthopedic surgery.    What do You need to know about Eliquis? Take your Eliquis TWICE DAILY - one tablet in the morning and one tablet in the evening with or without food.  It would be best to take the dose about the same time each day.  If you have difficulty swallowing the tablet whole please discuss with your pharmacist how to take the medication safely.  Take Eliquis exactly as prescribed by your doctor and DO NOT stop taking Eliquis without talking to the doctor who prescribed the medication.  Stopping without other medication to take the place of Eliquis may increase your risk of developing a clot.  After discharge, you should have regular check-up appointments with your healthcare provider that is prescribing your Eliquis.  What do you do if you miss a dose? If a dose of ELIQUIS is not taken at the scheduled time, take it as soon as possible on the same day and twice-daily administration should be resumed.  The dose should not be doubled to make up  for a missed dose.  Do not take more than one tablet of ELIQUIS at the same time.  Important Safety Information A possible side effect of Eliquis is bleeding. You should call your healthcare provider right away if you experience any of the following: Bleeding from an injury or your nose that does not stop. Unusual colored urine (red or dark brown) or unusual colored stools (red or black). Unusual bruising for unknown reasons. A serious fall or if you hit your head (even if there is no bleeding).  Some medicines may interact with Eliquis and might increase your risk of bleeding or clotting while on Eliquis. To help avoid this, consult your healthcare provider or pharmacist prior to using any new prescription or non-prescription medications, including herbals, vitamins, non-steroidal anti-inflammatory drugs (NSAIDs) and supplements.  This website has more information on Eliquis (apixaban): http://www.eliquis.com/eliquis/home

## 2023-02-27 NOTE — Progress Notes (Signed)
Orthopedic Tech Progress Note Patient Details:  Ronald Bond 1946/09/01 [id number]  Ortho Devices Type of Ortho Device: Bone foam zero knee Ortho Device/Splint Interventions: Ordered      [place] 02/27/2023, 3:19 PM

## 2023-02-27 NOTE — Anesthesia Preprocedure Evaluation (Addendum)
Anesthesia Evaluation  Patient identified by MRN, date of birth, ID band Patient awake    Reviewed: Allergy & Precautions, NPO status , Patient's Chart, lab work & pertinent test results  Airway Mallampati: II  TM Distance: >3 FB Neck ROM: Full    Dental  (+) Caps, Missing, Chipped   Pulmonary asthma , sleep apnea    breath sounds clear to auscultation       Cardiovascular hypertension, Pt. on medications  Rhythm:Regular Rate:Bradycardia     Neuro/Psych  Neuromuscular disease  negative psych ROS   GI/Hepatic Neg liver ROS,GERD  Medicated,,  Endo/Other  diabetesHypothyroidism    Renal/GU Renal disease     Musculoskeletal  (+) Arthritis ,    Abdominal Normal abdominal exam  (+)   Peds  Hematology negative hematology ROS (+)   Anesthesia Other Findings   Reproductive/Obstetrics                             Anesthesia Physical Anesthesia Plan  ASA: 2  Anesthesia Plan: Spinal   Post-op Pain Management: Regional block*   Induction: Intravenous  PONV Risk Score and Plan: 2 and Ondansetron and Propofol infusion  Airway Management Planned: Natural Airway and Simple Face Mask  Additional Equipment: None  Intra-op Plan:   Post-operative Plan:   Informed Consent: I have reviewed the patients History and Physical, chart, labs and discussed the procedure including the risks, benefits and alternatives for the proposed anesthesia with the patient or authorized representative who has indicated his/her understanding and acceptance.       Plan Discussed with: CRNA  Anesthesia Plan Comments: (Lab Results      Component                Value               Date                      WBC                      7.5                 02/16/2023                HGB                      11.3 (L)            02/16/2023                HCT                      35.5 (L)            02/16/2023                 MCV                      90.8                02/16/2023                PLT                      263                 02/16/2023           )  Anesthesia Quick Evaluation  

## 2023-02-27 NOTE — Anesthesia Postprocedure Evaluation (Signed)
Anesthesia Post Note  Patient: Ronald Bond  Procedure(s) Performed: TOTAL KNEE ARTHROPLASTY (Right: Knee)     Patient location during evaluation: PACU Anesthesia Type: Spinal Level of consciousness: oriented and awake and alert Pain management: pain level controlled Vital Signs Assessment: post-procedure vital signs reviewed and stable Respiratory status: spontaneous breathing, respiratory function stable and patient connected to nasal cannula oxygen Cardiovascular status: blood pressure returned to baseline and stable Postop Assessment: no headache, no backache, no apparent nausea or vomiting and spinal receding Anesthetic complications: no  No notable events documented.  Last Vitals:  Vitals:   02/27/23 1631 02/27/23 1647  BP:    Pulse: (!) 52 67  Resp:    Temp:    SpO2:      Last Pain:  Vitals:   02/27/23 1647  TempSrc:   PainSc: [name]

## 2023-02-27 NOTE — Transfer of Care (Signed)
Immediate Anesthesia Transfer of Care Note  Patient: Ronald Bond  Procedure(s) Performed: TOTAL KNEE ARTHROPLASTY (Right: Knee)  Patient Location: PACU  Anesthesia Type:Spinal  Level of Consciousness: awake, alert , oriented, and patient cooperative  Airway & Oxygen Therapy: Patient Spontanous Breathing and Patient connected to face mask oxygen  Post-op Assessment: Report given to RN and Post -op Vital signs reviewed and stable  Post vital signs: Reviewed and stable  Last Vitals:  Vitals Value Taken Time  BP    Temp    Pulse    Resp    SpO2      Last Pain:  Vitals:   02/27/23 1011  PainSc: 0-No pain         Complications: No notable events documented.

## 2023-02-27 NOTE — Op Note (Signed)
DATE OF SURGERY:  02/27/2023 TIME: 1:28 PM  PATIENT NAME:  Ronald Bond   AGE: 77 y.o.    PRE-OPERATIVE DIAGNOSIS: End-stage right knee osteoarthritis  POST-OPERATIVE DIAGNOSIS:  Same  PROCEDURE: Right total Knee Arthroplasty  SURGEON:  Charlotte Brafford A Kymberlie Brazeau, MD   ASSISTANT: Dorise Bullion, PA-C, present and scrubbed throughout the case, critical for assistance with exposure, retraction, instrumentation, and closure.   OPERATIVE IMPLANTS:  Cemented Zimmer persona size 9 standard CR femur, F right tibial baseplate, 30 mm tibial stem extension, 11 mm MC poly insert, 32 mm all poly patella Implant Name Type Inv. Item Serial No. Manufacturer Lot No. LRB No. Used Action  CEMENT BONE REFOBACIN R1X40 [place] - [id number] Cement CEMENT BONE REFOBACIN R1X40 [place]  ZIMMER RECON(ORTH,TRAU,BIO,SG) [id number] Right 2 Implanted  COMP FEM CMT PERSONA SZ9 RT - [id number] Joint COMP FEM CMT PERSONA SZ9 RT  ZIMMER RECON(ORTH,TRAU,BIO,SG) [id number] Right 1 Implanted  STEM POLY PAT PLY 26M KNEE - [id number] Knees STEM POLY PAT PLY 26M KNEE  ZIMMER RECON(ORTH,TRAU,BIO,SG) [id number] Right 1 Implanted  TIBIA STEM 5 DEG SZ F R KNEE - [id number] Knees TIBIA STEM 5 DEG SZ F R KNEE  ZIMMER RECON(ORTH,TRAU,BIO,SG) [id number] Right 1 Implanted  STEM TIB ST PERS 14+30 - [id number] Stem STEM TIB ST PERS 14+30  ZIMMER RECON(ORTH,TRAU,BIO,SG) [id number] Right 1 Implanted  INSERT TIB ARTI SZ8-11 RT 11 - [id number] Joint INSERT TIB ARTI SZ8-11 RT 11  ZIMMER RECON(ORTH,TRAU,BIO,SG) [id number] Right 1 Implanted      PREOPERATIVE INDICATIONS:  Ronald Bond is a 77 y.o. year old male with end stage bone on bone degenerative arthritis of the knee who failed conservative treatment, including injections, antiinflammatories, activity modification, and assistive devices, and had significant impairment of their activities of daily living, and elected for Total Knee Arthroplasty.   The risks, benefits, and alternatives were discussed at  length including but not limited to the risks of infection, bleeding, nerve injury, stiffness, blood clots, the need for revision surgery, cardiopulmonary complications, among others, and they were willing to proceed.  ESTIMATED BLOOD LOSS: 50cc  OPERATIVE DESCRIPTION:   Once adequate anesthesia was induced, preoperative antibiotics, 2 gm of ancef,1 gm of Tranexamic Acid, and 8 mg of Decadron administered, the patient was positioned supine with a right thigh tourniquet placed.  The right lower extremity was prepped and draped in sterile fashion.  A time-  out was performed identifying the patient, planned procedure, and the appropriate extremity.     The leg was  exsanguinated, tourniquet elevated to 250 mmHg.  A midline incision was  made followed by median parapatellar arthrotomy. Anterior horn of the medial meniscus was released and resected. A medial release was performed, the infrapatellar fat pad was resected with care taken to protect the patellar tendon. The suprapatellar fat was removed to exposed the distal anterior femur. The anterior horn of the lateral meniscus and ACL were released.    Following initial  exposure, I first started with the femur  The femoral  canal was opened with a drill, canal was suctioned to try to prevent fat emboli.  An  intramedullary rod was passed set at 5 degrees valgus, 10 mm. The distal femur was resected.  Following this resection, the tibia was  subluxated anteriorly.  Using the extramedullary guide, 10 mm of bone was resected off   the proximal lateral tibia.  The gap was tight in extension and flexion so additional 26mm was resected off the tibia. We confirmed the gap would be  stable medially and laterally with a size 22mm spacer block as well as confirmed that the tibial cut was perpendicular in the coronal plane, checking with an alignment rod.    Once this was done, the posterior femoral referencing femoral sizer was placed under to the posterior condyles  with 3 degrees of external rotational which was parallel to the transepicondylar axis and perpendicular to [employer]. The femur was sized to be a size 9 in the anterior-  posterior dimension. The  anterior, posterior, and  chamfer cuts were made without difficulty nor   notching making certain that I was along the anterior cortex to help  with flexion gap stability. Next a laminar spreader was placed with the knee in flexion and the medial lateral menisci were resected.  5 cc of the Exparel mixture was injected in the medial side of the back of the knee and 3 cc in the lateral side.  1/2 inch curved osteotome was used to resect posterior osteophyte that was then removed with a pituitary rongeur.       At this point, the tibia was sized to be a size F.  The size 9 tray was  then pinned in position. Trial reduction was now carried with a 9 femur, F tibia, a 10 mm MC insert.  The knee had full extension and was stable to varus valgus stress in extension.  The knee was slightly tight in flexion and the PCL was partially released.   Attention was next directed to the patella.  Precut  measurement was noted to be 25 mm.  I resected down to 15 mm and used a  80mm patellar button to restore patellar height as well as cover the cut surface.     The patella lug holes were drilled and a 32 mm patella poly trial was placed.    The knee was brought to full extension with good flexion stability with the patella tracking through the trochlea without application of pressure.     Next the femoral component was again assessed and determined to be seated and appropriately lateralized.  The femoral lug holes were drilled.  The femoral component was then removed. Tibial component was again assessed and felt to be seated and appropriately rotated with the medial third of the tubercle. The tibia was then drilled, and keel punched.     Final components were  opened and cement was mixed.      Final implants were then   cemented onto cleaned and dried cut surfaces of bone with the knee brought to extension with a 11 mm MC poly.  The knee was irrigated with sterile Betadine diluted in saline as well as pulse lavage normal saline.  The synovial lining was  then injected a dilute Exparel.      Once the cement had fully cured, excess cement was removed throughout the knee.  I confirmed that I was satisfied with the range of motion and stability, and the final 11 mm MC poly insert was chosen.  It was placed into the knee.         The tourniquet had been let down.  No significant hemostasis was required.  The medial parapatellar arthrotomy was then reapproximated using #1 Stratafix sutures with the knee  in flexion.  The remaining wound was closed with 0 stratafix, 2-0 Vicryl, and running 3-0 Monocryl. The knee was cleaned, dried, dressed sterilely using Dermabond and   Aquacel dressing.  The patient was then brought to recovery room  in stable condition, tolerating the procedure  well. There were no complications.   Post op recs: WB: WBAT Abx: ancef Imaging: PACU xrays DVT prophylaxis: Eliquis 2.5mg  BID x4 weeks Follow up: 2 weeks after surgery for a wound check with Dr. Zachery Dakins at [facility].  Address: [address] [place] [place], [place], [place] [address]  Office Phone: [phone]  Charlies Constable, MD Orthopaedic Surgery

## 2023-02-27 NOTE — Anesthesia Procedure Notes (Signed)
Spinal  Start time: 02/27/2023 11:12 AM End time: 02/27/2023 11:14 AM Reason for block: surgical anesthesia Staffing Performed: anesthesiologist  Anesthesiologist: Effie Berkshire, MD Performed by: Effie Berkshire, MD Authorized by: Effie Berkshire, MD   Preanesthetic Checklist Completed: patient identified, IV checked, site marked, risks and benefits discussed, surgical consent, monitors and equipment checked, pre-op evaluation and timeout performed Spinal Block Patient position: sitting Prep: DuraPrep and site prepped and draped Location: L3-4 Injection technique: single-shot Needle Needle type: Pencan  Needle gauge: 24 G Needle length: 10 cm Needle insertion depth: 10 cm Additional Notes Patient tolerated well. No immediate complications.  Functioning IV was confirmed and monitors were applied. Sterile prep and drape, including hand hygiene and sterile gloves were used. The patient was positioned and the back was prepped. The skin was anesthetized with lidocaine. Free flow of clear CSF was obtained prior to injecting local anesthetic into the CSF. The spinal needle aspirated freely following injection. The needle was carefully withdrawn. The patient tolerated the procedure well.

## 2023-02-28 ENCOUNTER — Encounter (HOSPITAL_COMMUNITY): Payer: Self-pay | Admitting: Orthopedic Surgery

## 2023-02-28 ENCOUNTER — Observation Stay (HOSPITAL_COMMUNITY): Payer: BC Managed Care – PPO

## 2023-02-28 DIAGNOSIS — E785 Hyperlipidemia, unspecified: Secondary | ICD-10-CM | POA: Diagnosis not present

## 2023-02-28 DIAGNOSIS — Z79890 Hormone replacement therapy (postmenopausal): Secondary | ICD-10-CM | POA: Diagnosis not present

## 2023-02-28 DIAGNOSIS — J4520 Mild intermittent asthma, uncomplicated: Secondary | ICD-10-CM | POA: Diagnosis not present

## 2023-02-28 DIAGNOSIS — E1122 Type 2 diabetes mellitus with diabetic chronic kidney disease: Secondary | ICD-10-CM | POA: Diagnosis not present

## 2023-02-28 DIAGNOSIS — M1711 Unilateral primary osteoarthritis, right knee: Secondary | ICD-10-CM | POA: Diagnosis not present

## 2023-02-28 DIAGNOSIS — Z79899 Other long term (current) drug therapy: Secondary | ICD-10-CM | POA: Diagnosis not present

## 2023-02-28 DIAGNOSIS — Z96652 Presence of left artificial knee joint: Secondary | ICD-10-CM | POA: Diagnosis not present

## 2023-02-28 DIAGNOSIS — R001 Bradycardia, unspecified: Secondary | ICD-10-CM | POA: Diagnosis not present

## 2023-02-28 DIAGNOSIS — N182 Chronic kidney disease, stage 2 (mild): Secondary | ICD-10-CM | POA: Diagnosis not present

## 2023-02-28 DIAGNOSIS — R079 Chest pain, unspecified: Secondary | ICD-10-CM | POA: Diagnosis not present

## 2023-02-28 DIAGNOSIS — N401 Enlarged prostate with lower urinary tract symptoms: Secondary | ICD-10-CM | POA: Diagnosis not present

## 2023-02-28 DIAGNOSIS — I129 Hypertensive chronic kidney disease with stage 1 through stage 4 chronic kidney disease, or unspecified chronic kidney disease: Secondary | ICD-10-CM | POA: Diagnosis not present

## 2023-02-28 DIAGNOSIS — E118 Type 2 diabetes mellitus with unspecified complications: Secondary | ICD-10-CM | POA: Diagnosis not present

## 2023-02-28 DIAGNOSIS — Z7901 Long term (current) use of anticoagulants: Secondary | ICD-10-CM | POA: Diagnosis not present

## 2023-02-28 DIAGNOSIS — E1169 Type 2 diabetes mellitus with other specified complication: Secondary | ICD-10-CM | POA: Diagnosis not present

## 2023-02-28 DIAGNOSIS — E890 Postprocedural hypothyroidism: Secondary | ICD-10-CM | POA: Diagnosis not present

## 2023-02-28 DIAGNOSIS — Z8249 Family history of ischemic heart disease and other diseases of the circulatory system: Secondary | ICD-10-CM | POA: Diagnosis not present

## 2023-02-28 DIAGNOSIS — K219 Gastro-esophageal reflux disease without esophagitis: Secondary | ICD-10-CM | POA: Diagnosis not present

## 2023-02-28 DIAGNOSIS — R0789 Other chest pain: Secondary | ICD-10-CM | POA: Diagnosis not present

## 2023-02-28 LAB — CBC
HCT: 29.3 % — ABNORMAL LOW (ref 39.0–52.0)
Hemoglobin: 9.2 g/dL — ABNORMAL LOW (ref 13.0–17.0)
MCH: 28.4 pg (ref 26.0–34.0)
MCHC: 31.4 g/dL (ref 30.0–36.0)
MCV: 90.4 fL (ref 80.0–100.0)
Platelets: 236 10*3/uL (ref 150–400)
RBC: 3.24 MIL/uL — ABNORMAL LOW (ref 4.22–5.81)
RDW: 15.2 % (ref 11.5–15.5)
WBC: 11.8 10*3/uL — ABNORMAL HIGH (ref 4.0–10.5)
nRBC: 0 % (ref 0.0–0.2)

## 2023-02-28 LAB — GLUCOSE, CAPILLARY
Glucose-Capillary: 103 mg/dL — ABNORMAL HIGH (ref 70–99)
Glucose-Capillary: 105 mg/dL — ABNORMAL HIGH (ref 70–99)
Glucose-Capillary: 100 mg/dL — ABNORMAL HIGH (ref 70–99)
Glucose-Capillary: 120 mg/dL — ABNORMAL HIGH (ref 70–99)

## 2023-02-28 LAB — BASIC METABOLIC PANEL
Anion gap: 8 (ref 5–15)
BUN: 21 mg/dL (ref 8–23)
CO2: 24 mmol/L (ref 22–32)
Calcium: 8.5 mg/dL — ABNORMAL LOW (ref 8.9–10.3)
Chloride: 103 mmol/L (ref 98–111)
Creatinine, Ser: 1.51 mg/dL — ABNORMAL HIGH (ref 0.61–1.24)
GFR, Estimated: 47 mL/min — ABNORMAL LOW (ref >60)
Glucose, Bld: 129 mg/dL — ABNORMAL HIGH (ref 70–99)
Potassium: 3.9 mmol/L (ref 3.5–5.1)
Sodium: 135 mmol/L (ref 135–145)

## 2023-02-28 LAB — TROPONIN I (HIGH SENSITIVITY)
Troponin I (High Sensitivity): 4 ng/L (ref <18)
Troponin I (High Sensitivity): 5 ng/L (ref <18)

## 2023-02-28 MED ORDER — OXYCODONE HCL 5 MG PO TABS
5.0000 mg | ORAL_TABLET | Freq: Once | ORAL | Status: AC
  Administered 2023-02-28: 5 mg via ORAL
  Filled 2023-02-28: qty 1

## 2023-02-28 MED ORDER — ONDANSETRON HCL 4 MG PO TABS: 4.0000 mg | ORAL_TABLET | Freq: Three times a day (TID) | ORAL | 0 refills | Status: AC | PRN

## 2023-02-28 MED ORDER — APIXABAN 2.5 MG PO TABS: 2.5000 mg | ORAL_TABLET | Freq: Two times a day (BID) | ORAL | 0 refills | Status: DC

## 2023-02-28 MED ORDER — ACETAMINOPHEN 500 MG PO TABS: 1000.0000 mg | ORAL_TABLET | Freq: Three times a day (TID) | ORAL | 0 refills | Status: AC | PRN

## 2023-02-28 MED ORDER — OXYCODONE HCL 5 MG PO TABS: 5.0000 mg | ORAL_TABLET | ORAL | 0 refills | Status: AC | PRN

## 2023-02-28 MED ORDER — METHOCARBAMOL 500 MG PO TABS: 500.0000 mg | ORAL_TABLET | Freq: Three times a day (TID) | ORAL | 0 refills | Status: AC | PRN

## 2023-02-28 MED ORDER — DEXAMETHASONE SODIUM PHOSPHATE 10 MG/ML IJ SOLN
10.0000 mg | Freq: Once | INTRAMUSCULAR | Status: AC
  Administered 2023-02-28: 10 mg via INTRAVENOUS
  Filled 2023-02-28: qty 1

## 2023-02-28 MED ORDER — HYDROMORPHONE HCL 2 MG PO TABS
2.0000 mg | ORAL_TABLET | ORAL | Status: DC | PRN
  Administered 2023-02-28: 2 mg via ORAL
  Administered 2023-02-28: 4 mg via ORAL
  Administered 2023-03-01 (×2): 2 mg via ORAL
  Filled 2023-02-28 (×2): qty 1
  Filled 2023-02-28: qty 2
  Filled 2023-02-28: qty 1

## 2023-02-28 NOTE — TOC Transition Note (Signed)
Transition of Care [facility]) - CM/SW Discharge Note  Patient Details  Name: Ronald Bond MRN: [id number] Date of Birth: 08/01/1946  [facility]) CM/SW Contact:  Sherie Don, LCSW Phone Number: 02/28/2023, 11:25 AM  Clinical Narrative: Patient is expected to discharge home after working with PT. CSW met with patient to review discharge plan and needs. Per orthopedist, patient will do OPPT at a [facility] clinic rather than HHPT, so CSW updated patient. Patient will need a rolling walker, which [employer] delivered to patient's room. TOC signing off.    Final next level of care: OP Rehab Barriers to Discharge: No Barriers Identified  Patient Goals and CMS Choice CMS Medicare.gov Compare Post Acute Care list provided to:: Patient Choice offered to / list presented to : Patient  Discharge Plan and Services Additional resources added to the After Visit Summary for       DME Arranged: Walker rolling DME Agency: [employer] Representative spoke with at DME Agency: Prearranged in orthopedist's office  Social Determinants of Health (SDOH) Interventions SDOH Screenings   Food Insecurity: No Food Insecurity (02/27/2023)  Housing: Low Risk  (02/27/2023)  Transportation Needs: No Transportation Needs (02/27/2023)  Utilities: Not At Risk (02/27/2023)  Alcohol Screen: Low Risk  (12/26/2022)  Depression (PHQ2-9): Low Risk  (12/28/2022)  Financial Resource Strain: Low Risk  (12/26/2022)  Physical Activity: Insufficiently Active (12/26/2022)  Social Connections: Moderately Isolated (12/26/2022)  Stress: No Stress Concern Present (12/26/2022)  Tobacco Use: Low Risk  (02/27/2023)   Readmission Risk Interventions     No data to display

## 2023-02-28 NOTE — Progress Notes (Signed)
RN contacted Dr. Zachery Dakins reporting pts pain level at 10 out of 1-10 pain scale and it's too early for pain meds. Dr. Zachery Dakins is aware of pt having IV Dilaudid 0.5mg  at 0954, Robaxin 500mg  PO at 0942, and oxycodone 10mg  PO at 1102 and VS as follows: 98.8, 121/54 map 73, P 48, R 20 and O2 100% on RA. Dr. Zachery Dakins is aware pts pulse gets down into low 40s and goes up into high 60s. Dr. Zachery Dakins called RN and verbally ordered one time dose 5mg  oxycodone PO, chest xray, telemetry and EKG for pt. RN entered orders. NT is currently in room obtaining EKG at this time. RN will add pt to telemetry.

## 2023-02-28 NOTE — Progress Notes (Signed)
Pt c/o left-sided chest pain at level of 5 on 0-10 scale, described as burning. RN alerted NT to obtain set of VS prior to RN notifying Dr. Abbott Pao reports chest pain only lasted about 10 min and has subsided. RN notified provider.

## 2023-02-28 NOTE — Progress Notes (Signed)
     Subjective:  Patient reports pain as mild.  Pleased with progress so far.  Has not yet worked with physical therapy.  Hopeful to discharge home later today if he can clear physical therapy.  Objective:   VITALS:   Vitals:   02/28/23 0247 02/28/23 0321 02/28/23 0642 02/28/23 0651  BP: (!) 134/57 134/66 122/64   Pulse: (!) 50 (!) 55 (!) 46 (!) 56  Resp: 14 15 16    Temp: 98.3 F (36.8 C) 98.4 F (36.9 C) 98.2 F (36.8 C)   TempSrc:  Oral    SpO2: 99% 100% 100%   Weight:      Height:        Sensation intact distally Intact pulses distally Dorsiflexion/Plantar flexion intact Incision: dressing C/D/I Compartment soft    Lab Results  Component Value Date   WBC 11.8 (H) 02/28/2023   HGB 9.2 (L) 02/28/2023   HCT 29.3 (L) 02/28/2023   MCV 90.4 02/28/2023   PLT 236 02/28/2023   BMET    Component Value Date/Time   NA 135 02/28/2023 0324   NA 139 05/17/2019 0906   K 3.9 02/28/2023 0324   CL 103 02/28/2023 0324   CO2 24 02/28/2023 0324   GLUCOSE 129 (H) 02/28/2023 0324   BUN 21 02/28/2023 0324   BUN 20 05/17/2019 0906   CREATININE 1.51 (H) 02/28/2023 0324   CALCIUM 8.5 (L) 02/28/2023 0324   GFRNONAA 47 (L) 02/28/2023 0324      Xray: Total knee arthroplasty components in good position no adverse features  Assessment/Plan: 1 Day Post-Op   Principal Problem:   Primary osteoarthritis of right knee  Status post right total knee arthroplasty 02/27/2023   Post op recs: WB: WBAT Abx: ancef Imaging: PACU xrays DVT prophylaxis: Eliquis 2.5mg  BID x4 weeks Follow up: 2 weeks after surgery for a wound check with Dr. Zachery Dakins at [facility].  Address: [address] [place], [place], [place] [address]  Office Phone: [phone]    Willaim Sheng 02/28/2023, 6:54 AM   Charlies Constable, MD  Contact information:   [phone] 7am-5pm epic message Dr. Zachery Dakins, or call office for patient follow up: [phone] After hours and holidays  please check [email or web address] for group call information for [facility]

## 2023-02-28 NOTE — Evaluation (Signed)
Physical Therapy Evaluation Patient Details Name: Ronald Bond MRN: [id number] DOB: Sep 01, 1946 Today's Date: 02/28/2023  History of Present Illness  77 yo male s/p R TKA 02/27/23. Hx of L TKA rev 2023, bradycardia, DM, cervical stenosis, herniated lumbar disc, Grave's disease  Clinical Impression  On eval, pt required Min A for mobility. He walked ~15 feet around the room. Pain rated 7/10 at time of PT session (was "over 20" per pt since about 8 this morning). Assisted pt into recliner to see if that offered any comfort. After ~ 5 minutes, pt reported he was having L side chest pain-rated 5/10. Made RN aware/called her back into room to assess. Pt then stated he felt like it could be heartburn. Conferred with RN who recommended I help pt back to bed. NT in to assess BP. Will follow up with RN before next session. Anticipate pt will need to stay another night.      Recommendations for follow up therapy are one component of a multi-disciplinary discharge planning process, led by the attending physician.  Recommendations may be updated based on patient status, additional functional criteria and insurance authorization.  Follow Up Recommendations       Assistance Recommended at Discharge Intermittent Supervision/Assistance  Patient can return home with the following  A little help with walking and/or transfers;A little help with bathing/dressing/bathroom;Assistance with cooking/housework;Assist for transportation;Help with stairs or ramp for entrance    Equipment Recommendations Rolling walker (2 wheels)  Recommendations for Other Services       Functional Status Assessment Patient has had a recent decline in their functional status and demonstrates the ability to make significant improvements in function in a reasonable and predictable amount of time.     Precautions / Restrictions Precautions Precautions: Fall;Knee Precaution Comments: bradycardia; poor pain control Restrictions Weight  Bearing Restrictions: No Other Position/Activity Restrictions: WBAT      Mobility  Bed Mobility Overal bed mobility: Needs Assistance Bed Mobility: Supine to Sit     Supine to sit: Min assist, HOB elevated     General bed mobility comments: Assist for R LE off bed. Increased time. Cues provided. Pt denied dizziness.    Transfers Overall transfer level: Needs assistance Equipment used: Rolling walker (2 wheels) Transfers: Sit to/from Stand Sit to Stand: Min assist, From elevated surface           General transfer comment: Cues for safety, technique, hand/LE placemenet. Assist to rise, steady. Increased time. Pt denied dizziness.    Ambulation/Gait Ambulation/Gait assistance: Min assist Gait Distance (Feet): 15 Feet Assistive device: Rolling walker (2 wheels) Gait Pattern/deviations: Step-to pattern, Antalgic       General Gait Details: Cues for safety, technique, sequence. Pt took a small lap around room. Pt stated pain a little better with WBing. Limited distance this session out of caution  Stairs            Wheelchair Mobility    Modified Rankin (Stroke Patients Only)       Balance Overall balance assessment: Needs assistance         Standing balance support: During functional activity, Reliant on assistive device for balance, Bilateral upper extremity supported Standing balance-Leahy Scale: Fair                               Pertinent Vitals/Pain Pain Assessment Pain Assessment: 0-10 Pain Score: 7  Pain Location: R knee Pain Descriptors / Indicators: Discomfort, Aching, Grimacing,  Operative site guarding Pain Intervention(s): Limited activity within patient's tolerance, Repositioned, Ice applied, RN gave pain meds during session    [place] expects to be discharged to:: Private residence Living Arrangements: Spouse/significant other Available Help at Discharge: Family;Available 24 hours/day Type of Home:  House Home Access: Stairs to enter Entrance Stairs-Rails: Psychiatric nurse of Steps: 4   Home Layout: Two level Home Equipment: Cane - single point;Shower seat - built in;Grab bars - tub/shower      Prior Function Prior Level of Function : Independent/Modified Independent             Mobility Comments: uses SPC during periods of high pain ADLs Comments: ind     Hand Dominance        Extremity/Trunk Assessment   Upper Extremity Assessment Upper Extremity Assessment: Overall WFL for tasks assessed    Lower Extremity Assessment Lower Extremity Assessment: Generalized weakness    Cervical / Trunk Assessment Cervical / Trunk Assessment: Normal  Communication   Communication: No difficulties  Cognition Arousal/Alertness: Awake/alert Behavior During Therapy: WFL for tasks assessed/performed Overall Cognitive Status: Within Functional Limits for tasks assessed                                          General Comments      Exercises     Assessment/Plan    PT Assessment Patient needs continued PT services  PT Problem List Decreased strength;Decreased balance;Decreased range of motion;Decreased mobility;Decreased activity tolerance;Decreased knowledge of use of DME;Pain       PT Treatment Interventions DME instruction;Functional mobility training;Therapeutic activities;Balance training;Patient/family education;Therapeutic exercise    PT Goals (Current goals can be found in the Care Plan section)  Acute Rehab PT Goals Patient Stated Goal: less pain. regain PLOF/independence PT Goal Formulation: With patient Time For Goal Achievement: 03/14/23 Potential to Achieve Goals: Good    Frequency 7X/week     Co-evaluation               AM-PAC PT "6 Clicks" Mobility  Outcome Measure Help needed turning from your back to your side while in a flat bed without using bedrails?: A Little Help needed moving from lying on your back  to sitting on the side of a flat bed without using bedrails?: A Little Help needed moving to and from a bed to a chair (including a wheelchair)?: A Little Help needed standing up from a chair using your arms (e.g., wheelchair or bedside chair)?: A Little Help needed to walk in hospital room?: A Little Help needed climbing 3-5 steps with a railing? : A Little 6 Click Score: 18    End of Session Equipment Utilized During Treatment: Gait belt Activity Tolerance: Patient limited by pain Patient left: in chair;with call bell/phone within reach;with chair alarm set;with nursing/sitter in room   PT Visit Diagnosis: Pain;Other abnormalities of gait and mobility (R26.89) Pain - Right/Left: Right Pain - part of body: Knee    Time: [id number] PT Time Calculation (min) (ACUTE ONLY): 27 min   Charges:   PT Evaluation $PT Eval Low Complexity: 1 Low PT Treatments $Gait Training: 8-22 mins          Doreatha Massed, PT Acute Rehabilitation  Office: [phone]

## 2023-02-28 NOTE — Consult Note (Signed)
Inpatient Consultation Note    Patient: Ronald Bond [id number] DOB: [birth year] PCP: Janith Lima, MD DOA: 02/27/2023 DOS: the patient was seen and examined on 02/28/2023 Primary service: Willaim Sheng, MD  Referring physician: Dr. Zachery Dakins Reason for consult: Chest pain, Bradycardia  Summary and Recommendations:  This is a 77 year old gentleman with a history of CKD stage II, GERD, hypertension, hyperlipidemia, hypothyroidism, known sinus bradycardia who underwent uncomplicated right total knee replacement 02/27/23.  He has overall been doing well other than significant pain in his right knee.  Hospitalist was consulted due to concerns for bradycardia, and chest pain. -His bradycardia is chronic, no further intervention indicated unless new symptoms or complications arise -While he does chronically have some chest discomfort, today's chest pain was clearly exertional -obtain 2D echo to evaluate for heart failure or wall motion abnormalities -Will check troponin now, and trend x 3  Further workup and recommendations to follow the above.  TRH will continue to follow.  HPI: Ronald Bond is a 77 y.o. male with past medical history of CKD stage II, GERD, hypertension, hyperlipidemia, hypothyroidism, bradycardia who was admitted to the orthopedic service for elective right total knee replacement which was performed without complication on 0000000.  Patient has been doing well in recovery, he states that he is having a lot of pain, which is worse than during his 2 previous knee surgeries (he had a left total knee replacement in the past and then revision done last year).  He denies any nausea, dizziness, palpitations.  Today this morning when he was working with physical therapy, he states that about 5 minutes after working with him in the hallway, when he returned to his chair in his room, he started having a pressure-like squeezing chest pain, without radiation.  Never had pain  like that before.  It lasted about 15 minutes.  There was also concern from nursing staff that the patient is bradycardic, with heart rate in the 40s.  On review of the patient's outpatient record, I see that his heart rate has often been in the 40s during previous hospital admissions and office visits.  He actually had cardiac event monitor placed in late 2023, which showed that his heart rate dipped as low as the 30s, and he was asymptomatic.  He was taken off of beta-blockers, and no other intervention was planned as the patient is functionally doing well and is asymptomatic.  There was also mention in the cardiology notes that the patient has had intermittent chest pain, there was no plan to work this up further especially since it was not exertional.  Review of Systems: Complete review of systems was performed with the patient, and negative except as mentioned above in the HPI  Past Medical History:  Diagnosis Date   Anemia    Chronic kidney disease    GERD (gastroesophageal reflux disease)    HTN (hypertension)    Hyperlipidemia    Hypothyroidism    Osteoarthritis    Pneumonia    Pre-diabetes    Sleep apnea    no CPAP   Wears glasses    Past Surgical History:  Procedure Laterality Date   COLONOSCOPY     EYE SURGERY  2006   both cataracts   MICROLARYNGOSCOPY Left 11/18/2013   Procedure: MICROLARYNGOSCOPY WITH REMOVAL OF GRANULOMA LEFT SIDE;  Surgeon: Izora Gala, MD;  Location: [facility];  Service: ENT;  Laterality: Left;   NM MYOVIEW LTD  06/2007   Low risk, normal.  No evidence of ischemia or infarction   THYROIDECTOMY  1974   TONSILLECTOMY     as a child   TOTAL KNEE ARTHROPLASTY  2009   left   TOTAL KNEE REVISION Left 07/06/2022   Procedure: TOTAL KNEE REVISION;  Surgeon: Willaim Sheng, MD;  Location: [facility];  Service: Orthopedics;  Laterality: Left;   Social History:  reports that he has never smoked. He has never used smokeless tobacco. He reports  current alcohol use. He reports that he does not use drugs.  Allergies  Allergen Reactions   Lisinopril Other (See Comments)    Edema in face    Family History  Problem Relation Age of Onset   Brain cancer Mother    Heart disease Father    Heart disease Brother     Prior to Admission medications   Medication Sig Start Date End Date Taking? Authorizing Provider  acetaminophen (TYLENOL) 500 MG tablet Take 2 tablets (1,000 mg total) by mouth every 8 (eight) hours as needed. 02/28/23 03/30/23 Yes Marchwiany, Virgina Norfolk, MD  albuterol (VENTOLIN HFA) 108 (90 Base) MCG/ACT inhaler Inhale 2 puffs into the lungs every 6 (six) hours as needed for wheezing or shortness of breath. 07/05/21  Yes Janith Lima, MD  amLODipine (NORVASC) 10 MG tablet TAKE 1 TABLET BY MOUTH EVERY DAY 01/28/23  Yes Janith Lima, MD  apixaban (ELIQUIS) 2.5 MG TABS tablet Take 1 tablet (2.5 mg total) by mouth 2 (two) times daily. 07/08/22  Yes Marchwiany, Virgina Norfolk, MD  apixaban (ELIQUIS) 2.5 MG TABS tablet Take 1 tablet (2.5 mg total) by mouth 2 (two) times daily. 02/28/23  Yes Willaim Sheng, MD  atorvastatin (LIPITOR) 40 MG tablet TAKE 1 TABLET BY MOUTH EVERY DAY 12/26/22  Yes Janith Lima, MD  Blood Pressure Monitoring (BLOOD PRESSURE MONITOR/ARM) DEVI 1 Device by Does not apply route daily. USE DAILY TO  TAKE BLOOD PRESSURE READINGS 12/27/21  Yes Leonie Man, MD  cetirizine (ZYRTEC) 10 MG tablet Take 10 mg by mouth daily.   Yes [provider]  cholecalciferol (VITAMIN D3) 25 MCG (1000 UNIT) tablet Take 1,000 Units by mouth daily.   Yes [provider]  docusate sodium (COLACE) 100 MG capsule Take 100 mg by mouth 2 (two) times daily.   Yes [provider]  Finerenone (KERENDIA) 10 MG TABS Take 1 tablet (10 mg total) by mouth daily. 02/20/23  Yes Janith Lima, MD  fluticasone (FLONASE) 50 MCG/ACT nasal spray Place 2 sprays into both nostrils daily. 10/15/18  Yes Janith Lima, MD   hydrALAZINE (APRESOLINE) 25 MG tablet TAKE 1 TABLET BY MOUTH THREE TIMES A DAY 12/24/22  Yes Janith Lima, MD  indapamide (LOZOL) 1.25 MG tablet Take 1 tablet (1.25 mg total) by mouth daily. 12/20/22  Yes Janith Lima, MD  levothyroxine (SYNTHROID) 112 MCG tablet Take 1 tablet (112 mcg total) by mouth daily. 01/17/23  Yes Shamleffer, Melanie Crazier, MD  methocarbamol (ROBAXIN) 500 MG tablet Take 1 tablet (500 mg total) by mouth every 8 (eight) hours as needed for up to 10 days for muscle spasms. 02/28/23 03/10/23 Yes Willaim Sheng, MD  Multiple Vitamin (MULTIVITAMIN) tablet Take 1 tablet by mouth daily.   Yes [provider]  Multiple Vitamins-Minerals (ZINC PO) Take 1 tablet by mouth daily.   Yes [provider]  omeprazole (PRILOSEC) 40 MG capsule Take 1 capsule (40 mg total) by mouth daily. 10/27/22  Yes Janith Lima,  MD  ondansetron (ZOFRAN) 4 MG tablet Take 1 tablet (4 mg total) by mouth every 8 (eight) hours as needed for up to 14 days for nausea or vomiting. 02/28/23 03/14/23 Yes Marchwiany, Virgina Norfolk, MD  oxyCODONE (ROXICODONE) 5 MG immediate release tablet Take 1 tablet (5 mg total) by mouth every 4 (four) hours as needed for up to 7 days for severe pain or moderate pain. 02/28/23 03/07/23 Yes Marchwiany, Virgina Norfolk, MD  Polyethyl Glycol-Propyl Glycol 0.4-0.3 % SOLN Place 1 drop into both eyes as needed (dry eyes).   Yes [provider]  tamsulosin (FLOMAX) 0.4 MG CAPS capsule Take 1 capsule (0.4 mg total) by mouth daily after breakfast. 11/03/22  Yes Janith Lima, MD  TURMERIC CURCUMIN PO Take 1 tablet by mouth daily.   Yes [provider]  valsartan (DIOVAN) 320 MG tablet TAKE 1 TABLET BY MOUTH EVERY DAY 09/29/22  Yes Janith Lima, MD  vitamin C (ASCORBIC ACID) 500 MG tablet Take 500 mg by mouth daily.   Yes [provider]  FARXIGA 10 MG TABS tablet TAKE 1 TABLET BY MOUTH EVERY DAY BEFORE BREAKFAST 01/19/23   Janith Lima, MD   metoprolol tartrate (LOPRESSOR) 25 MG tablet TAKE 0.5 TABLETS BY MOUTH 2 TIMES DAILY. 02/27/23   Leonie Man, MD    Physical Exam: Vitals:   02/28/23 0936 02/28/23 1126 02/28/23 1319 02/28/23 1359  BP: 119/64 (!) 120/58 (!) 121/54   Pulse: (!) 54 (!) 41 (!) 48 (!) 52  Resp:  14 20   Temp:  98.1 F (36.7 C) 98.8 F (37.1 C)   TempSrc:  Oral Oral   SpO2:  99% 100%   Weight:      Height:      General:  Alert, oriented, calm, in no acute distress  Eyes: EOMI, clear conjuctivae, white sclerea Neck: supple, no masses, trachea mildline  Cardiovascular: RRR, no murmurs or rubs, no peripheral edema  Respiratory: clear to auscultation bilaterally, no wheezes, no crackles  Abdomen: soft, nontender, nondistended, normal bowel tones heard  Skin: dry, no rashes  Musculoskeletal: no joint effusions, right knee immobilized Psychiatric: appropriate affect, normal speech  Neurologic: extraocular muscles intact, clear speech, moving all extremities with intact sensorium  Data Reviewed:     Latest Ref Rng & Units 02/28/2023    3:24 AM 02/16/2023    1:50 PM 12/28/2022    9:16 AM  CBC  WBC 4.0 - 10.5 K/uL 11.8  7.5  7.8   Hemoglobin 13.0 - 17.0 g/dL 9.2  11.3  12.5   Hematocrit 39.0 - 52.0 % 29.3  35.5  36.6   Platelets 150 - 400 K/uL 236  263  293.0       Latest Ref Rng & Units 02/28/2023    3:24 AM 02/16/2023    1:50 PM 12/28/2022    9:16 AM  BMP  Glucose 70 - 99 mg/dL 129  104  90   BUN 8 - 23 mg/dL 21  22  23    Creatinine 0.61 - 1.24 mg/dL 1.51  1.71  1.75   Sodium 135 - 145 mmol/L 135  135  137   Potassium 3.5 - 5.1 mmol/L 3.9  3.6  4.6   Chloride 98 - 111 mmol/L 103  101  100   CO2 22 - 32 mmol/L 24  25  28    Calcium 8.9 - 10.3 mg/dL 8.5  8.8  9.6     Primary team communication: Messaged Dr. Zachery Dakins  my recommendations. Thank you very much for involving [place] in the care of your patient.  Author: Seydina Holliman Neva Seat, MD 02/28/2023 2:11 PM  For on call review [email or web address].

## 2023-02-28 NOTE — Progress Notes (Signed)
PT Cancellation Note  Patient Details Name: Ronald Bond MRN: [id number] DOB: 12/18/1945   Cancelled Treatment:    Reason Eval/Treat Not Completed: Medical issues which prohibited therapy. Spoke with RN-pt to have some tests performed. She also reports his pain is still poorly controlled. Will hold PT session this afternoon. Will check back in the am.    Doreatha Massed, PT Acute Rehabilitation  Office: [phone]

## 2023-02-28 NOTE — Progress Notes (Signed)
RN notified Dr. Zachery Dakins regarding pts BP, P and meds due. Dr. Zachery Dakins is aware of pts VS at [phone] as follows: 98.3, 113/47, map 66, P 45 and VS at 0936 include 119/64, map 80, P 54 with pain level of 9. Dr. Zachery Dakins is aware of RN giving 0.5mg  of IV Dilaudid and PO Robaxin for pain relief and RN held BP meds as follows: hydralazine 25mg , amlodipine 10mg , indapamide 1.25mg , irbesartan 300mg  and metoprolol 12.5mg . Per Dr. Zachery Dakins, continue to hold meds and pt will stay 1 more night. RN also made Dr. Zachery Dakins aware of pts P going down intos 40s and then back up into the 60s and continues to go up and down. Pt and family report P in the 94s is pts usual pulse.

## 2023-02-28 NOTE — Progress Notes (Signed)
Patient seen this evening. Dealing with a lot of pain the afternoon since block wore off. Exam reassuring. No pain with passive stretch, compartments soft, no significant swelling. Will give IV decadron to see if that improves his symptoms.

## 2023-03-01 ENCOUNTER — Inpatient Hospital Stay (HOSPITAL_COMMUNITY): Payer: BC Managed Care – PPO

## 2023-03-01 ENCOUNTER — Ambulatory Visit: Payer: BC Managed Care – PPO | Admitting: Physical Therapy

## 2023-03-01 DIAGNOSIS — E118 Type 2 diabetes mellitus with unspecified complications: Secondary | ICD-10-CM

## 2023-03-01 DIAGNOSIS — M1711 Unilateral primary osteoarthritis, right knee: Secondary | ICD-10-CM | POA: Diagnosis not present

## 2023-03-01 LAB — GLUCOSE, CAPILLARY
Glucose-Capillary: 123 mg/dL — ABNORMAL HIGH (ref 70–99)
Glucose-Capillary: 110 mg/dL — ABNORMAL HIGH (ref 70–99)

## 2023-03-01 LAB — CBC
HCT: 30.6 % — ABNORMAL LOW (ref 39.0–52.0)
Hemoglobin: 10.1 g/dL — ABNORMAL LOW (ref 13.0–17.0)
MCH: 29.4 pg (ref 26.0–34.0)
MCHC: 33 g/dL (ref 30.0–36.0)
MCV: 89 fL (ref 80.0–100.0)
Platelets: 229 10*3/uL (ref 150–400)
RBC: 3.44 MIL/uL — ABNORMAL LOW (ref 4.22–5.81)
RDW: 15.4 % (ref 11.5–15.5)
WBC: 12.2 10*3/uL — ABNORMAL HIGH (ref 4.0–10.5)
nRBC: 0 % (ref 0.0–0.2)

## 2023-03-01 MED ORDER — ALUM & MAG HYDROXIDE-SIMETH 200-200-20 MG/5ML PO SUSP: 30.0000 mL | ORAL | Status: DC | PRN

## 2023-03-01 NOTE — Progress Notes (Signed)
[facility] CONSULT PROGRESS NOTE    Progress Note  Ronald Bond  [id number] DOB: 02/27/1946 DOA: 02/27/2023 PCP: Janith Lima, MD     Brief Narrative:   Ronald Bond is an 77 y.o. male past medical history of chronic kidney disease stage II, GERD, hyperlipidemia admitted by orthopedic surgery for an elective total knee replacement was performed without any complications on [id number] relates that after 5 minutes after working with physical therapy in the hallway he started having chest pain without radiation lasted about 15 minutes nursing staff is also concerned as the patient became bradycardic with a heart rate in the 40s (after reviewing his chart his heart rate has been frequently in the 40s at his PCPs office) cardiology notes he also mentions he has had intermittent chest pain there was no plan for workup specially it was nonexertional.   Assessment/Plan:   Primary osteoarthritis of right knee Further management per orthopedic surgery.  Sinus bradycardia: Appears to be a chronic problem unless new onset of symptoms and arrives continue to monitor.  Atypical chest pain: Cardiac biomarkers have basically remained flat, twelve-lead EKG shows sinus bradycardia no T wave abnormalities. 2D echo is pending.   DVT prophylaxis: lovenox Family Communication:none Status is: Inpatient Remains inpatient appropriate because: Right knee osteoarthritis    Code Status:     Code Status Orders  (From admission, onward)           Start     Ordered   02/27/23 1607  Full code  Continuous       Question:  By:  Answer:  Consent: discussion documented in EHR   02/27/23 1606           Code Status History     Date Active Date Inactive Code Status Order ID Comments User Context   07/06/2022 1427 07/08/2022 1918 Full Code [id number]  Willaim Sheng, MD Inpatient      Advance Directive Documentation    Flowsheet Row Most Recent Value  Type of Advance  Directive Healthcare Power of [place], Living will  Pre-existing out of facility DNR order (yellow form or pink MOST form) --  "MOST" Form in Place? --         IV Access:   Peripheral IV   Procedures and diagnostic studies:   DG Chest 1 View  Result Date: 02/28/2023 CLINICAL DATA:  Chest pain EXAM: CHEST  1 VIEW COMPARISON:  CT 09/28/2016.  X-ray 02/04/2019 FINDINGS: No consolidation, pneumothorax or effusion. No edema. Normal cardiopericardial silhouette. Degenerative changes of the spine. Slightly elevated right hemidiaphragm IMPRESSION: No acute cardiopulmonary disease Electronically Signed   By: Jill Side M.D.   On: 02/28/2023 14:29   DG Knee Right Port  Result Date: 02/27/2023 CLINICAL DATA:  Postop right knee EXAM: PORTABLE RIGHT KNEE - 2 VIEW COMPARISON:  Right knee radiograph dated December 06, 2013 FINDINGS: Interval postsurgical changes from right total knee arthroplasty. Arthroplasty components appear in their expected alignment. No periprosthetic fracture is identified. Expected postoperative changes within the overlying soft tissues. IMPRESSION: Status post right total knee arthroplasty. Electronically Signed   By: Yetta Glassman M.D.   On: 02/27/2023 15:14     Medical Consultants:   None.   Subjective:    Ronald Bond denies any further chest pain, he does relate he is at this chest pain in the past.  Objective:    Vitals:   02/28/23 2154 03/01/23 0601 03/01/23 0655 03/01/23 0700  BP: (!) 157/66 (!) 184/84 Marland Kitchen)  163/67 (!) 161/72  Pulse: (!) 54 61 61 64  Resp: 15 16  16   Temp: 99.2 F (37.3 C) 98.9 F (37.2 C)  98.9 F (37.2 C)  TempSrc:    Oral  SpO2: 96% 97%  97%  Weight:      Height:       SpO2: 97 % O2 Flow Rate (L/min): 3 L/min   Intake/Output Summary (Last 24 hours) at 03/01/2023 1145 Last data filed at 03/01/2023 0845 Gross per 24 hour  Intake 845 ml  Output 3575 ml  Net -2730 ml   Filed Weights   02/27/23 0907  Weight: 94 kg     Exam: General exam: In no acute distress. Respiratory system: Good air movement and clear to auscultation. Cardiovascular system: S1 & S2 heard, RRR. No JV. Gastrointestinal system: Abdomen is nondistended, soft and nontender.  Extremities: No pedal edema. Skin: No rashes, lesions or ulcers Psychiatry: Judgement and insight appear normal. Mood & affect appropriate.    Data Reviewed:    Labs: Basic Metabolic Panel: Recent Labs  Lab 02/28/23 0324  NA 135  K 3.9  CL 103  CO2 24  GLUCOSE 129*  BUN 21  CREATININE 1.51*  CALCIUM 8.5*   GFR Estimated Creatinine Clearance: 46.4 mL/min (A) (by C-G formula based on SCr of 1.51 mg/dL (H)). Liver Function Tests: No results for input(s): "AST", "ALT", "ALKPHOS", "BILITOT", "PROT", "ALBUMIN" in the last 168 hours. No results for input(s): "LIPASE", "AMYLASE" in the last 168 hours. No results for input(s): "AMMONIA" in the last 168 hours. Coagulation profile No results for input(s): "INR", "PROTIME" in the last 168 hours. COVID-19 Labs  No results for input(s): "DDIMER", "FERRITIN", "LDH", "CRP" in the last 72 hours.  No results found for: "SARSCOV2NAA"  CBC: Recent Labs  Lab 02/28/23 0324 03/01/23 0333  WBC 11.8* 12.2*  HGB 9.2* 10.1*  HCT 29.3* 30.6*  MCV 90.4 89.0  PLT 236 229   Cardiac Enzymes: No results for input(s): "CKTOTAL", "CKMB", "CKMBINDEX", "TROPONINI" in the last 168 hours. BNP (last 3 results) No results for input(s): "PROBNP" in the last 8760 hours. CBG: Recent Labs  Lab 02/28/23 0739 02/28/23 1148 02/28/23 1630 02/28/23 2158 03/01/23 0737  GLUCAP 103* 105* 100* 120* 123*   D-Dimer: No results for input(s): "DDIMER" in the last 72 hours. Hgb A1c: No results for input(s): "HGBA1C" in the last 72 hours. Lipid Profile: No results for input(s): "CHOL", "HDL", "LDLCALC", "TRIG", "CHOLHDL", "LDLDIRECT" in the last 72 hours. Thyroid function studies: No results for input(s): "TSH", "T4TOTAL",  "T3FREE", "THYROIDAB" in the last 72 hours.  Invalid input(s): "FREET3" Anemia work up: No results for input(s): "VITAMINB12", "FOLATE", "FERRITIN", "TIBC", "IRON", "RETICCTPCT" in the last 72 hours. Sepsis Labs: Recent Labs  Lab 02/28/23 0324 03/01/23 0333  WBC 11.8* 12.2*   Microbiology No results found for this or any previous visit (from the past 240 hour(s)).   Medications:    amLODipine  10 mg Oral Daily   apixaban  2.5 mg Oral Q12H   atorvastatin  40 mg Oral Daily   dapagliflozin propanediol  10 mg Oral Daily   docusate sodium  100 mg Oral BID   Finerenone  1 tablet Oral Daily   hydrALAZINE  25 mg Oral TID   indapamide  1.25 mg Oral Daily   insulin aspart  0-15 Units Subcutaneous TID WC   irbesartan  300 mg Oral Daily   levothyroxine  112 mcg Oral Daily   loratadine  10 mg  Oral Daily   metoprolol tartrate  12.5 mg Oral BID   pantoprazole  40 mg Oral Daily   tamsulosin  0.4 mg Oral Daily   Continuous Infusions:  lactated ringers 10 mL/hr at 03/01/23 0946   methocarbamol (ROBAXIN) IV 110 mL/hr at 02/28/23 1817      LOS: 1 day   Charlynne Cousins  [facility]  03/01/2023, 11:45 AM

## 2023-03-01 NOTE — Plan of Care (Signed)
  Problem: Education: Goal: Knowledge of the prescribed therapeutic regimen will improve Outcome: Progressing   Problem: Pain Management: Goal: Pain level will decrease with appropriate interventions Outcome: Progressing   Problem: Education: Goal: Knowledge of General Education information will improve Description: Including pain rating scale, medication(s)/side effects and non-pharmacologic comfort measures Outcome: Progressing   

## 2023-03-01 NOTE — Progress Notes (Addendum)
     Subjective:  Patient reports feeling much better this morning than he did yesterday afternoon.  Workup per medicine has been reassuring regarding episodes of bradycardia and chest pain. he denies chest pain today.  Hopeful to go home later today if he has a good day with PT and pain control.  Objective:   VITALS:   Vitals:   02/28/23 1359 02/28/23 1623 02/28/23 2154 03/01/23 0601  BP:  (!) 149/67 (!) 157/66 (!) 184/84  Pulse: (!) 52 (!) 54 (!) 54 61  Resp:  20 15 16   Temp:  98.6 F (37 C) 99.2 F (37.3 C) 98.9 F (37.2 C)  TempSrc:  Oral    SpO2:  100% 96% 97%  Weight:      Height:        Sensation intact distally Intact pulses distally Dorsiflexion/Plantar flexion intact Incision: dressing C/D/I Compartment soft    Lab Results  Component Value Date   WBC 12.2 (H) 03/01/2023   HGB 10.1 (L) 03/01/2023   HCT 30.6 (L) 03/01/2023   MCV 89.0 03/01/2023   PLT 229 03/01/2023   BMET    Component Value Date/Time   NA 135 02/28/2023 0324   NA 139 05/17/2019 0906   K 3.9 02/28/2023 0324   CL 103 02/28/2023 0324   CO2 24 02/28/2023 0324   GLUCOSE 129 (H) 02/28/2023 0324   BUN 21 02/28/2023 0324   BUN 20 05/17/2019 0906   CREATININE 1.51 (H) 02/28/2023 0324   CALCIUM 8.5 (L) 02/28/2023 0324   GFRNONAA 47 (L) 02/28/2023 0324   Xray: Total knee arthroplasty components in good position no adverse features  Assessment/Plan: 2 Days Post-Op   Principal Problem:   Primary osteoarthritis of right knee  Status post right total knee arthroplasty 02/27/2023   Post op recs: WB: WBAT Abx: ancef Imaging: PACU xrays DVT prophylaxis: Eliquis 2.5mg  BID x4 weeks Follow up: 2 weeks after surgery for a wound check with Dr. Zachery Dakins at [facility].  Address: [address] [place], [place], [place] [address]  Office Phone: [phone]    Willaim Sheng 03/01/2023, 6:39 AM   Charlies Constable, MD  Contact information:   [phone] 7am-5pm epic  message Dr. Zachery Dakins, or call office for patient follow up: [phone] After hours and holidays please check [email or web address] for group call information for [facility]

## 2023-03-01 NOTE — Progress Notes (Signed)
Physical Therapy Treatment Patient Details Name: Ronald Bond MRN: [id number] DOB: 02-16-1946 Today's Date: 03/01/2023   History of Present Illness 77 yo male s/p R TKA 02/27/23. Hx of L TKA rev 2023, bradycardia, DM, cervical stenosis, herniated lumbar disc, Grave's disease    PT Comments    Pt reports improved pain control on today compared to yesterday. Moderate pain with activity. He denied chest pain during session. Will plan to have a 2nd session prior to potential d/c home if meeting PT goals and pain remains controlled.     Recommendations for follow up therapy are one component of a multi-disciplinary discharge planning process, led by the attending physician.  Recommendations may be updated based on patient status, additional functional criteria and insurance authorization.  Follow Up Recommendations       Assistance Recommended at Discharge Intermittent Supervision/Assistance  Patient can return home with the following A little help with walking and/or transfers;A little help with bathing/dressing/bathroom;Assistance with cooking/housework;Assist for transportation;Help with stairs or ramp for entrance   Equipment Recommendations  Rolling walker (2 wheels)    Recommendations for Other Services       Precautions / Restrictions Precautions Precautions: Fall;Knee Precaution Comments: bradycardia; poor pain control Restrictions Weight Bearing Restrictions: No Other Position/Activity Restrictions: WBAT     Mobility  Bed Mobility Overal bed mobility: Needs Assistance Bed Mobility: Supine to Sit     Supine to sit: Min guard, HOB elevated     General bed mobility comments: Pt used gait belt as leg lifter. Increased time. Cues provided.    Transfers Overall transfer level: Needs assistance Equipment used: Rolling walker (2 wheels) Transfers: Sit to/from Stand Sit to Stand: Min assist, From elevated surface           General transfer comment: Cues for  safety, technique, hand/LE placemenet. Assist to rise, steady. Increased time.    Ambulation/Gait Ambulation/Gait assistance: Min assist Gait Distance (Feet): 120 Feet Assistive device: Rolling walker (2 wheels) Gait Pattern/deviations: Step-to pattern, Antalgic       General Gait Details: Cues for safety, technique, sequence. Intermittent assist to stabilize. Pt denied dizziness. Tolerated activity fairly well.   Stairs             Wheelchair Mobility    Modified Rankin (Stroke Patients Only)       Balance Overall balance assessment: Needs assistance         Standing balance support: During functional activity, Reliant on assistive device for balance, Bilateral upper extremity supported Standing balance-Leahy Scale: Poor                              Cognition Arousal/Alertness: Awake/alert Behavior During Therapy: WFL for tasks assessed/performed Overall Cognitive Status: Within Functional Limits for tasks assessed                                          Exercises Total Joint Exercises Ankle Circles/Pumps: AROM, Both, 10 reps Quad Sets: Both, 10 reps Short Arc Quad: AAROM, Right, 5 reps Heel Slides: AAROM, Right, 10 reps Hip ABduction/ADduction: AAROM, Right, 10 reps Straight Leg Raises: AAROM, Right, 10 reps    General Comments        Pertinent Vitals/Pain Pain Assessment Pain Assessment: 0-10 Pain Score: 7  Pain Location: R knee Pain Descriptors / Indicators: Discomfort, Aching, Grimacing, Operative site guarding  Pain Intervention(s): Limited activity within patient's tolerance, Monitored during session, Repositioned, Ice applied    Home Living                          Prior Function            PT Goals (current goals can now be found in the care plan section) Progress towards PT goals: Progressing toward goals    Frequency    7X/week      PT Plan Current plan remains appropriate     Co-evaluation              AM-PAC PT "6 Clicks" Mobility   Outcome Measure  Help needed turning from your back to your side while in a flat bed without using bedrails?: A Little Help needed moving from lying on your back to sitting on the side of a flat bed without using bedrails?: A Little Help needed moving to and from a bed to a chair (including a wheelchair)?: A Little Help needed standing up from a chair using your arms (e.g., wheelchair or bedside chair)?: A Little Help needed to walk in hospital room?: A Little Help needed climbing 3-5 steps with a railing? : A Little 6 Click Score: 18    End of Session Equipment Utilized During Treatment: Gait belt Activity Tolerance: Patient tolerated treatment well Patient left: in chair;with call bell/phone within reach   PT Visit Diagnosis: Pain;Other abnormalities of gait and mobility (R26.89) Pain - Right/Left: Right Pain - part of body: Knee     Time: 1033-1101 PT Time Calculation (min) (ACUTE ONLY): 28 min  Charges:  $Gait Training: 8-22 mins $Therapeutic Exercise: 8-22 mins                        Doreatha Massed, PT Acute Rehabilitation  Office: [phone]

## 2023-03-01 NOTE — Progress Notes (Signed)
Physical Therapy Treatment Patient Details Name: Ronald Bond MRN: [id number] DOB: [birth year] Today's Date: 03/01/2023   History of Present Illness 77 yo male s/p R TKA 02/27/23. Hx of L TKA rev 2023, bradycardia, DM, cervical stenosis, herniated lumbar disc, Grave's disease    PT Comments    Progressing with mobility. Moderate pain with activity. Reviewed/practiced gait and stair training. Encouraged pt to ambulate often at home, as tolerated. All PT education completed.    Recommendations for follow up therapy are one component of a multi-disciplinary discharge planning process, led by the attending physician.  Recommendations may be updated based on patient status, additional functional criteria and insurance authorization.  Follow Up Recommendations       Assistance Recommended at Discharge Intermittent Supervision/Assistance  Patient can return home with the following A little help with walking and/or transfers;A little help with bathing/dressing/bathroom;Assistance with cooking/housework;Assist for transportation;Help with stairs or ramp for entrance   Equipment Recommendations  Rolling walker (2 wheels)    Recommendations for Other Services       Precautions / Restrictions Precautions Precautions: Fall;Knee Precaution Comments: bradycardia; poor pain control Restrictions Weight Bearing Restrictions: No Other Position/Activity Restrictions: WBAT     Mobility  Bed Mobility Overal bed mobility: Needs Assistance Bed Mobility: Supine to Sit, Sit to Supine     Supine to sit: HOB elevated, Supervision Sit to supine: Supervision, HOB elevated   General bed mobility comments: Pt used gait belt as leg lifter. Increased time.    Transfers Overall transfer level: Needs assistance Equipment used: Rolling walker (2 wheels) Transfers: Sit to/from Stand Sit to Stand: Min guard           General transfer comment: Cues for safety, technique, hand/LE placement. Assist to  rise, steady. Increased time.    Ambulation/Gait Ambulation/Gait assistance: Min guard Gait Distance (Feet): 100 Feet Assistive device: Rolling walker (2 wheels) Gait Pattern/deviations: Step-to pattern, Antalgic       General Gait Details: Cues for safety, technique, sequence.Pt denied dizziness. Tolerated activity fairly well.   Stairs Stairs: Yes   Stair Management: Sideways, Step to pattern, One rail Right Number of Stairs: 2 General stair comments: up and over portable stairs x 1. cues for safety, technique, sequnce.   Wheelchair Mobility    Modified Rankin (Stroke Patients Only)       Balance Overall balance assessment: Needs assistance         Standing balance support: During functional activity, Reliant on assistive device for balance, Bilateral upper extremity supported Standing balance-Leahy Scale: Fair                              Cognition Arousal/Alertness: Awake/alert Behavior During Therapy: WFL for tasks assessed/performed Overall Cognitive Status: Within Functional Limits for tasks assessed                                          Exercises     General Comments        Pertinent Vitals/Pain Pain Assessment Pain Assessment: 0-10 Pain Score: 7  Pain Location: R knee/thigh Pain Descriptors / Indicators: Discomfort, Aching, Grimacing, Operative site guarding Pain Intervention(s): Limited activity within patient's tolerance, Monitored during session, Repositioned, Ice applied    Home Living  Prior Function            PT Goals (current goals can now be found in the care plan section) Progress towards PT goals: Progressing toward goals    Frequency    7X/week      PT Plan Current plan remains appropriate    Co-evaluation              AM-PAC PT "6 Clicks" Mobility   Outcome Measure  Help needed turning from your back to your side while in a flat bed  without using bedrails?: None Help needed moving from lying on your back to sitting on the side of a flat bed without using bedrails?: None Help needed moving to and from a bed to a chair (including a wheelchair)?: A Little Help needed standing up from a chair using your arms (e.g., wheelchair or bedside chair)?: A Little Help needed to walk in hospital room?: A Little Help needed climbing 3-5 steps with a railing? : A Little 6 Click Score: 20    End of Session Equipment Utilized During Treatment: Gait belt Activity Tolerance: Patient tolerated treatment well Patient left: in bed;with call bell/phone within reach   PT Visit Diagnosis: Pain;Other abnormalities of gait and mobility (R26.89) Pain - Right/Left: Right Pain - part of body: Knee     Time: [id number] PT Time Calculation (min) (ACUTE ONLY): 23 min  Charges:  $Gait Training: 23-37 mins $Therapeutic Exercise: 8-22 mins                         Doreatha Massed, PT Acute Rehabilitation  Office: [phone]

## 2023-03-02 ENCOUNTER — Encounter: Payer: Self-pay | Admitting: *Deleted

## 2023-03-02 ENCOUNTER — Telehealth: Payer: Self-pay | Admitting: *Deleted

## 2023-03-02 ENCOUNTER — Other Ambulatory Visit: Payer: Self-pay

## 2023-03-02 ENCOUNTER — Ambulatory Visit: Payer: Self-pay

## 2023-03-02 MED ORDER — METOPROLOL TARTRATE 25 MG PO TABS: ORAL_TABLET | ORAL | 1 refills | Status: DC

## 2023-03-02 NOTE — Chronic Care Management (AMB) (Signed)
   03/02/2023  Ronald Bond 12/28/1945 [id number]   Reason for Encounter: Patient is not currently enrolled in the CCM program. CCM status changed to previously enrolled.   Horris Latino RN Care Manager/Chronic Care Management [phone]

## 2023-03-02 NOTE — Discharge Summary (Addendum)
Physician Discharge Summary  Patient ID: Ronald Bond MRN: [id number] DOB/AGE: [id number] 77 y.o.  Admit date: 02/27/2023 Discharge date: 03/01/23  Admission Diagnoses:  Primary osteoarthritis of right knee  Discharge Diagnoses:  Principal Problem:   Primary osteoarthritis of right knee   Past Medical History:  Diagnosis Date   Anemia    Chronic kidney disease    GERD (gastroesophageal reflux disease)    HTN (hypertension)    Hyperlipidemia    Hypothyroidism    Osteoarthritis    Pneumonia    Pre-diabetes    Sleep apnea    no CPAP   Wears glasses     Surgeries: Procedure(s): TOTAL KNEE ARTHROPLASTY on 02/27/2023   Consultants (if any):   Discharged Condition: Improved  Hospital Course: Ronald Bond is an 77 y.o. male who was admitted 02/27/2023 with a diagnosis of Primary osteoarthritis of right knee and went to the operating room on 02/27/2023 and underwent the above named procedures.  Patient had some chest pain and and bradycardia POD1. Med c/s. Recs appreciated. Workup reassuring. Symptoms resolved POD2. Patient discharged home.  He was given perioperative antibiotics:  Anti-infectives (From admission, onward)    Start     Dose/Rate Route Frequency Ordered Stop   02/27/23 1700  ceFAZolin (ANCEF) IVPB 2g/100 mL premix        2 g 200 mL/hr over 30 Minutes Intravenous Every 6 hours 02/27/23 1606 02/27/23 2254   02/27/23 0745  ceFAZolin (ANCEF) IVPB 2g/100 mL premix        2 g 200 mL/hr over 30 Minutes Intravenous On call to O.R. 02/27/23 [id number] 02/27/23 1133     .  He was given sequential compression devices, early ambulation, and eliquis for DVT prophylaxis.  He benefited maximally from the hospital stay and there were no complications.    Recent vital signs:  Vitals:   03/01/23 1328 03/01/23 1342  BP: (!) 157/76 (!) 168/76  Pulse: 68 67  Resp: 16 16  Temp: 98.3 F (36.8 C) 99.1 F (37.3 C)  SpO2: 100% 98%    Recent laboratory studies:  Lab Results   Component Value Date   HGB 10.1 (L) 03/01/2023   HGB 9.2 (L) 02/28/2023   HGB 11.3 (L) 02/16/2023   Lab Results  Component Value Date   WBC 12.2 (H) 03/01/2023   PLT 229 03/01/2023   Lab Results  Component Value Date   INR 0.9 10/17/2007   Lab Results  Component Value Date   NA 135 02/28/2023   K 3.9 02/28/2023   CL 103 02/28/2023   CO2 24 02/28/2023   BUN 21 02/28/2023   CREATININE 1.51 (H) 02/28/2023   GLUCOSE 129 (H) 02/28/2023    Discharge Medications:   Allergies as of 03/01/2023       Reactions   Lisinopril Other (See Comments)   Edema in face        Medication List     TAKE these medications    acetaminophen 500 MG tablet Commonly known as: TYLENOL Take 2 tablets (1,000 mg total) by mouth every 8 (eight) hours as needed.   albuterol 108 (90 Base) MCG/ACT inhaler Commonly known as: VENTOLIN HFA Inhale 2 puffs into the lungs every 6 (six) hours as needed for wheezing or shortness of breath.   amLODipine 10 MG tablet Commonly known as: NORVASC TAKE 1 TABLET BY MOUTH EVERY DAY   apixaban 2.5 MG Tabs tablet Commonly known as: Eliquis Take 1 tablet (2.5 mg total) by mouth 2 (  two) times daily.   ascorbic acid 500 MG tablet Commonly known as: VITAMIN C Take 500 mg by mouth daily.   atorvastatin 40 MG tablet Commonly known as: LIPITOR TAKE 1 TABLET BY MOUTH EVERY DAY   Blood Pressure Monitor/Arm Devi 1 Device by Does not apply route daily. USE DAILY TO  TAKE BLOOD PRESSURE READINGS   cetirizine 10 MG tablet Commonly known as: ZYRTEC Take 10 mg by mouth daily.   cholecalciferol 25 MCG (1000 UNIT) tablet Commonly known as: VITAMIN D3 Take 1,000 Units by mouth daily.   docusate sodium 100 MG capsule Commonly known as: COLACE Take 100 mg by mouth 2 (two) times daily.   Farxiga 10 MG Tabs tablet Generic drug: dapagliflozin propanediol TAKE 1 TABLET BY MOUTH EVERY DAY BEFORE BREAKFAST   fluticasone 50 MCG/ACT nasal spray Commonly known as:  FLONASE Place 2 sprays into both nostrils daily.   hydrALAZINE 25 MG tablet Commonly known as: APRESOLINE TAKE 1 TABLET BY MOUTH THREE TIMES A DAY   indapamide 1.25 MG tablet Commonly known as: LOZOL Take 1 tablet (1.25 mg total) by mouth daily.   Kerendia 10 MG Tabs Generic drug: Finerenone Take 1 tablet (10 mg total) by mouth daily.   levothyroxine 112 MCG tablet Commonly known as: SYNTHROID Take 1 tablet (112 mcg total) by mouth daily.   methocarbamol 500 MG tablet Commonly known as: ROBAXIN Take 1 tablet (500 mg total) by mouth every 8 (eight) hours as needed for up to 10 days for muscle spasms.   metoprolol tartrate 25 MG tablet Commonly known as: LOPRESSOR TAKE 0.5 TABLETS BY MOUTH 2 TIMES DAILY. What changed: See the new instructions.   multivitamin tablet Take 1 tablet by mouth daily.   omeprazole 40 MG capsule Commonly known as: PRILOSEC Take 1 capsule (40 mg total) by mouth daily.   ondansetron 4 MG tablet Commonly known as: Zofran Take 1 tablet (4 mg total) by mouth every 8 (eight) hours as needed for up to 14 days for nausea or vomiting.   oxyCODONE 5 MG immediate release tablet Commonly known as: Roxicodone Take 1 tablet (5 mg total) by mouth every 4 (four) hours as needed for up to 7 days for severe pain or moderate pain.   Polyethyl Glycol-Propyl Glycol 0.4-0.3 % Soln Place 1 drop into both eyes as needed (dry eyes).   tamsulosin 0.4 MG Caps capsule Commonly known as: FLOMAX Take 1 capsule (0.4 mg total) by mouth daily after breakfast.   TURMERIC CURCUMIN PO Take 1 tablet by mouth daily.   valsartan 320 MG tablet Commonly known as: DIOVAN TAKE 1 TABLET BY MOUTH EVERY DAY   ZINC PO Take 1 tablet by mouth daily.        Diagnostic Studies: DG Chest 1 View  Result Date: 02/28/2023 CLINICAL DATA:  Chest pain EXAM: CHEST  1 VIEW COMPARISON:  CT 09/28/2016.  X-ray 02/04/2019 FINDINGS: No consolidation, pneumothorax or effusion. No edema. Normal  cardiopericardial silhouette. Degenerative changes of the spine. Slightly elevated right hemidiaphragm IMPRESSION: No acute cardiopulmonary disease Electronically Signed   By: Jill Side M.D.   On: 02/28/2023 14:29   DG Knee Right Port  Result Date: 02/27/2023 CLINICAL DATA:  Postop right knee EXAM: PORTABLE RIGHT KNEE - 2 VIEW COMPARISON:  Right knee radiograph dated December 06, 2013 FINDINGS: Interval postsurgical changes from right total knee arthroplasty. Arthroplasty components appear in their expected alignment. No periprosthetic fracture is identified. Expected postoperative changes within the overlying soft tissues. IMPRESSION: Status  post right total knee arthroplasty. Electronically Signed   By: Yetta Glassman M.D.   On: 02/27/2023 15:14    Disposition: Discharge disposition: 01-Home or Self Care       Discharge Instructions     Call MD / Call 911   Complete by: As directed    If you experience chest pain or shortness of breath, CALL 911 and be transported to the hospital emergency room.  If you develope a fever above 101 F, pus (white drainage) or increased drainage or redness at the wound, or calf pain, call your surgeon's office.   Constipation Prevention   Complete by: As directed    Drink plenty of fluids.  Prune juice may be helpful.  You may use a stool softener, such as Colace (over the counter) 100 mg twice a day.  Use MiraLax (over the counter) for constipation as needed.   Diet - low sodium heart healthy   Complete by: As directed    Increase activity slowly as tolerated   Complete by: As directed    Post-operative opioid taper instructions:   Complete by: As directed    POST-OPERATIVE OPIOID TAPER INSTRUCTIONS: It is important to wean off of your opioid medication as soon as possible. If you do not need pain medication after your surgery it is ok to stop day one. Opioids include: Codeine, Hydrocodone(Norco, Vicodin), Oxycodone(Percocet, oxycontin) and  hydromorphone amongst others.  Long term and even short term use of opiods can cause: Increased pain response Dependence Constipation Depression Respiratory depression And more.  Withdrawal symptoms can include Flu like symptoms Nausea, vomiting And more Techniques to manage these symptoms Hydrate well Eat regular healthy meals Stay active Use relaxation techniques(deep breathing, meditating, yoga) Do Not substitute Alcohol to help with tapering If you have been on opioids for less than two weeks and do not have pain than it is ok to stop all together.  Plan to wean off of opioids This plan should start within one week post op of your joint replacement. Maintain the same interval or time between taking each dose and first decrease the dose.  Cut the total daily intake of opioids by one tablet each day Next start to increase the time between doses. The last dose that should be eliminated is the evening dose.               Discharge Instructions      INSTRUCTIONS AFTER JOINT REPLACEMENT   Remove items at home which could result in a fall. This includes throw rugs or furniture in walking pathways ICE to the affected joint every three hours while awake for 30 minutes at a time, for at least the first 3-5 days, and then as needed for pain and swelling.  Continue to use ice for pain and swelling. You may notice swelling that will progress down to the foot and ankle.  This is normal after surgery.  Elevate your leg when you are not up walking on it.   Continue to use the breathing machine you got in the hospital (incentive spirometer) which will help keep your temperature down.  It is common for your temperature to cycle up and down following surgery, especially at night when you are not up moving around and exerting yourself.  The breathing machine keeps your lungs expanded and your temperature down.   DIET:  As you were doing prior to hospitalization, we recommend a  well-balanced diet.  DRESSING / WOUND CARE / SHOWERING  Keep the  surgical dressing until follow up.  The dressing is water proof, so you can shower without any extra covering.  IF THE DRESSING FALLS OFF or the wound gets wet inside, change the dressing with sterile gauze.  Please use good hand washing techniques before changing the dressing.  Do not use any lotions or creams on the incision until instructed by your surgeon.    ACTIVITY  Increase activity slowly as tolerated, but follow the weight bearing instructions below.   No driving for 6 weeks or until further direction given by your physician.  You cannot drive while taking narcotics.  No lifting or carrying greater than 10 lbs. until further directed by your surgeon. Avoid periods of inactivity such as sitting longer than an hour when not asleep. This helps prevent blood clots.  You may return to work once you are authorized by your doctor.     WEIGHT BEARING   Weight bearing as tolerated with assist device (walker, cane, etc) as directed, use it as long as suggested by your surgeon or therapist, typically at least 4-6 weeks.   EXERCISES  Results after joint replacement surgery are often greatly improved when you follow the exercise, range of motion and muscle strengthening exercises prescribed by your doctor. Safety measures are also important to protect the joint from further injury. Any time any of these exercises cause you to have increased pain or swelling, decrease what you are doing until you are comfortable again and then slowly increase them. If you have problems or questions, call your caregiver or physical therapist for advice.   Rehabilitation is important following a joint replacement. After just a few days of immobilization, the muscles of the leg can become weakened and shrink (atrophy).  These exercises are designed to build up the tone and strength of the thigh and leg muscles and to improve motion. Often times heat  used for twenty to thirty minutes before working out will loosen up your tissues and help with improving the range of motion but do not use heat for the first two weeks following surgery (sometimes heat can increase post-operative swelling).   These exercises can be done on a training (exercise) mat, on the floor, on a table or on a bed. Use whatever works the best and is most comfortable for you.    Use music or television while you are exercising so that the exercises are a pleasant break in your day. This will make your life better with the exercises acting as a break in your routine that you can look forward to.   Perform all exercises about fifteen times, three times per day or as directed.  You should exercise both the operative leg and the other leg as well.  Exercises include:   Quad Sets - Tighten up the muscle on the front of the thigh (Quad) and hold for 5-10 seconds.   Straight Leg Raises - With your knee straight (if you were given a brace, keep it on), lift the leg to 60 degrees, hold for 3 seconds, and slowly lower the leg.  Perform this exercise against resistance later as your leg gets stronger.  Leg Slides: Lying on your back, slowly slide your foot toward your buttocks, bending your knee up off the floor (only go as far as is comfortable). Then slowly slide your foot back down until your leg is flat on the floor again.  Angel Wings: Lying on your back spread your legs to the side as far apart as  you can without causing discomfort.  Hamstring Strength:  Lying on your back, push your heel against the floor with your leg straight by tightening up the muscles of your buttocks.  Repeat, but this time bend your knee to a comfortable angle, and push your heel against the floor.  You may put a pillow under the heel to make it more comfortable if necessary.   A rehabilitation program following joint replacement surgery can speed recovery and prevent re-injury in the future due to weakened  muscles. Contact your doctor or a physical therapist for more information on knee rehabilitation.    CONSTIPATION  Constipation is defined medically as fewer than three stools per week and severe constipation as less than one stool per week.  Even if you have a regular bowel pattern at home, your normal regimen is likely to be disrupted due to multiple reasons following surgery.  Combination of anesthesia, postoperative narcotics, change in appetite and fluid intake all can affect your bowels.   YOU MUST use at least one of the following options; they are listed in order of increasing strength to get the job done.  They are all available over the counter, and you may need to use some, POSSIBLY even all of these options:    Drink plenty of fluids (prune juice may be helpful) and high fiber foods Colace 100 mg by mouth twice a day  Senokot for constipation as directed and as needed Dulcolax (bisacodyl), take with full glass of water  Miralax (polyethylene glycol) once or twice a day as needed.  If you have tried all these things and are unable to have a bowel movement in the first 3-4 days after surgery call either your surgeon or your primary doctor.    If you experience loose stools or diarrhea, hold the medications until you stool forms back up.  If your symptoms do not get better within 1 week or if they get worse, check with your doctor.  If you experience "the worst abdominal pain ever" or develop nausea or vomiting, please contact the office immediately for further recommendations for treatment.   ITCHING:  If you experience itching with your medications, try taking only a single pain pill, or even half a pain pill at a time.  You can also use Benadryl over the counter for itching or also to help with sleep.   TED HOSE STOCKINGS:  Use stockings on both legs until for at least 2 weeks or as directed by physician office. They may be removed at night for sleeping.  MEDICATIONS:  See your  medication summary on the "After Visit Summary" that nursing will review with you.  You may have some home medications which will be placed on hold until you complete the course of blood thinner medication.  It is important for you to complete the blood thinner medication as prescribed.   Blood clot prevention (DVT Prophylaxis): After surgery you are at an increased risk for a blood clot. you were prescribed a blood thinner, Eliquis, to be taken daily for a total of 4 weeks from surgery to help reduce your risk of getting a blood clot. You may then continue your regular dosage.  This will help prevent a blood clot. Signs of a pulmonary embolus (blood clot in the lungs) include sudden short of breath, feeling lightheaded or dizzy, chest pain with a deep breath, rapid pulse rapid breathing. Signs of a blood clot in your arms or legs include new unexplained swelling and cramping,  warm, red or darkened skin around the painful area. Please call the office or 911 right away if these signs or symptoms develop.  PRECAUTIONS:  If you experience chest pain or shortness of breath - call 911 immediately for transfer to the hospital emergency department.   If you develop a fever greater that 101 F, purulent drainage from wound, increased redness or drainage from wound, foul odor from the wound/dressing, or calf pain - CONTACT YOUR SURGEON.                                                   FOLLOW-UP APPOINTMENTS:  If you do not already have a post-op appointment, please call the office for an appointment to be seen by your surgeon.  Guidelines for how soon to be seen are listed in your "After Visit Summary", but are typically between 2-3 weeks after surgery.  OTHER INSTRUCTIONS:   Knee Replacement:  Do not place pillow under knee, focus on keeping the knee straight while resting. CPM instructions: 0-90 degrees, 2 hours in the morning, 2 hours in the afternoon, and 2 hours in the evening. Place foam block, curve side  up under heel at all times except when in CPM or when walking.  DO NOT modify, tear, cut, or change the foam block in any way.  POST-OPERATIVE OPIOID TAPER INSTRUCTIONS: It is important to wean off of your opioid medication as soon as possible. If you do not need pain medication after your surgery it is ok to stop day one. Opioids include: Codeine, Hydrocodone(Norco, Vicodin), Oxycodone(Percocet, oxycontin) and hydromorphone amongst others.  Long term and even short term use of opiods can cause: Increased pain response Dependence Constipation Depression Respiratory depression And more.  Withdrawal symptoms can include Flu like symptoms Nausea, vomiting And more Techniques to manage these symptoms Hydrate well Eat regular healthy meals Stay active Use relaxation techniques(deep breathing, meditating, yoga) Do Not substitute Alcohol to help with tapering If you have been on opioids for less than two weeks and do not have pain than it is ok to stop all together.  Plan to wean off of opioids This plan should start within one week post op of your joint replacement. Maintain the same interval or time between taking each dose and first decrease the dose.  Cut the total daily intake of opioids by one tablet each day Next start to increase the time between doses. The last dose that should be eliminated is the evening dose.   MAKE SURE YOU:  Understand these instructions.  Get help right away if you are not doing well or get worse.    Thank you for letting us be a part of your medical care team.  It is a privilege we respect greatly.  We hope these instructions will help you stay on track for a fast and full recovery!      Information on my medicine - ELIQUIS (apixaban)  This medication education was reviewed with me or my healthcare representative as part of my discharge preparation.    Why was Eliquis prescribed for you? Eliquis was prescribed for you to reduce the risk of  blood clots forming after orthopedic surgery.    What do You need to know about Eliquis? Take your Eliquis TWICE DAILY - one tablet in the morning and one tablet in the evening with  or without food.  It would be best to take the dose about the same time each day.  If you have difficulty swallowing the tablet whole please discuss with your pharmacist how to take the medication safely.  Take Eliquis exactly as prescribed by your doctor and DO NOT stop taking Eliquis without talking to the doctor who prescribed the medication.  Stopping without other medication to take the place of Eliquis may increase your risk of developing a clot.  After discharge, you should have regular check-up appointments with your healthcare provider that is prescribing your Eliquis.  What do you do if you miss a dose? If a dose of ELIQUIS is not taken at the scheduled time, take it as soon as possible on the same day and twice-daily administration should be resumed.  The dose should not be doubled to make up for a missed dose.  Do not take more than one tablet of ELIQUIS at the same time.  Important Safety Information A possible side effect of Eliquis is bleeding. You should call your healthcare provider right away if you experience any of the following: Bleeding from an injury or your nose that does not stop. Unusual colored urine (red or dark brown) or unusual colored stools (red or black). Unusual bruising for unknown reasons. A serious fall or if you hit your head (even if there is no bleeding).  Some medicines may interact with Eliquis and might increase your risk of bleeding or clotting while on Eliquis. To help avoid this, consult your healthcare provider or pharmacist prior to using any new prescription or non-prescription medications, including herbals, vitamins, non-steroidal anti-inflammatory drugs (NSAIDs) and supplements.  This website has more information on Eliquis (apixaban):  http://www.eliquis.com/eliquis/home       Signed: Dashan Chizmar A Elley Harp 03/02/2023, 6:24 AM

## 2023-03-02 NOTE — Transitions of Care (Post Inpatient/ED Visit) (Signed)
03/02/2023  Name: Ronald Bond MRN: [id number] DOB: Jun 11, 1946  Today's TOC FU Call Status: Today's TOC FU Call Status:: Successful TOC FU Call Competed TOC FU Call Complete Date: 03/02/23  Transition Care Management Follow-up Telephone Call Date of Discharge: 03/01/23 Discharge Facility: [facility]) Type of Discharge: Inpatient Admission Primary Inpatient Discharge Diagnosis:: (R) TKR/ arthroplasty How have you been since you were released from the hospital?: Better ("I am doing okay, but having a lot of pain.  My wife and my daughters are here looking out for me and I am taking the medicine like they told me to") Any questions or concerns?: No  Items Reviewed: Did you receive and understand the discharge instructions provided?: Yes (thoroughly reviewed with patient and daughter who verbalizes excellent understanding of same) Medications obtained and verified?: Yes (Medications Reviewed) (Partial medication review completed; declines full review- states daughter is a Software engineer; confirmed patient obtained/ is taking all newly Rx'd medications as instructed; daughter manages medications/ denies questions/ concerns around medications today) Any new allergies since your discharge?: No Dietary orders reviewed?: Yes Type of Diet Ordered:: "healthy as possible" Do you have support at home?: Yes People in Home: child(ren), adult, spouse Name of Support/Comfort Primary Source: resides with spouse; adult daughters assisting as needed/ indicated; patient reports he is independent in self care activities at baseline- requires minimal assistance post- recent surgery  Home Care and Equipment/Supplies: [place] Ordered?: No Any new equipment or medical supplies ordered?: Yes (rolling walker) Name of Medical supply agency?: [employer] Were you able to get the equipment/medical supplies?: Yes Do you have any questions related to the use of the equipment/supplies?: No (confirmed  is using walker post recent surgical TKR)  Functional Questionnaire: Do you need assistance with bathing/showering or dressing?: Yes (normally independent- family assisting post-recent surgery) Do you need assistance with meal preparation?: Yes (normally independent- family assisting post-recent surgery) Do you need assistance with eating?: No Do you have difficulty maintaining continence: No Do you need assistance with getting out of bed/getting out of a chair/moving?: Yes (normally independent- family assisting post-recent surgery) Do you have difficulty managing or taking your medications?: Yes (daugher is a pharmacist and assists as needed; reports she has managed all medication changes post-recent surgery)  Follow up appointments reviewed: PCP Follow-up appointment confirmed?: NA (verified not indicated per review of post-surgery provider discharging notes) [facility] Follow-up appointment confirmed?: No Reason Specialist Follow-Up Not Confirmed: Patient has Specialist Provider Number and will Call for Appointment (encouraged to call asap to schedule post-surgical provider office visit; confirmed family/ pt. has phone number for surgery provider) Do you need transportation to your follow-up appointment?: No Do you understand care options if your condition(s) worsen?: Yes-patient verbalized understanding  SDOH Interventions Today    Flowsheet Row Most Recent Value  SDOH Interventions   Food Insecurity Interventions Intervention Not Indicated  Transportation Interventions Intervention Not Indicated  [normally drives self,  family assisiting post recent surgery]      TOC Interventions Today    Flowsheet Row Most Recent Value  TOC Interventions   TOC Interventions Discussed/Reviewed TOC Interventions Discussed  [provided my direct contact information should questions/ concerns/ needs arise post-TOC call, prior to RN CM telephone visit]      Interventions Today     Flowsheet Row Most Recent Value  Chronic Disease   Chronic disease during today's visit Other  [recent surgery for (R) TKR]  General Interventions   General Interventions Discussed/Reviewed General Interventions Discussed, Doctor Visits,  Durable Medical Equipment (DME)  Doctor Visits Discussed/Reviewed Specialist  Durable Medical Equipment (DME) [place] obtained and using walker as instructed post-recent surgery for (R) TKR]  Exercise Interventions   Exercise Discussed/Reviewed Exercise Discussed  [outpatient PT services- confirmed appoiontments scheduled,  encouraged his active participation/ engagement]  Nutrition Interventions   Nutrition Discussed/Reviewed Nutrition Discussed  Pharmacy Interventions   Pharmacy Dicussed/Reviewed Pharmacy Topics Discussed  [discussed normal side effects of narcotic pain medication,  encouraged patient to monitor for side effects and take precautions to prevent constipation]      Oneta Rack, RN, BSN, CCRN Alumnus RN CM Care Coordination/ Transition of [place] Management [phone]: direct office

## 2023-03-03 ENCOUNTER — Encounter: Payer: Self-pay | Admitting: Physical Therapy

## 2023-03-03 ENCOUNTER — Ambulatory Visit: Payer: BC Managed Care – PPO | Attending: Orthopedic Surgery | Admitting: Physical Therapy

## 2023-03-03 DIAGNOSIS — M6281 Muscle weakness (generalized): Secondary | ICD-10-CM | POA: Diagnosis not present

## 2023-03-03 DIAGNOSIS — M25561 Pain in right knee: Secondary | ICD-10-CM | POA: Diagnosis not present

## 2023-03-03 DIAGNOSIS — M25661 Stiffness of right knee, not elsewhere classified: Secondary | ICD-10-CM | POA: Diagnosis not present

## 2023-03-03 DIAGNOSIS — R600 Localized edema: Secondary | ICD-10-CM | POA: Diagnosis not present

## 2023-03-03 DIAGNOSIS — R262 Difficulty in walking, not elsewhere classified: Secondary | ICD-10-CM

## 2023-03-03 NOTE — Therapy (Signed)
OUTPATIENT PHYSICAL THERAPY LOWER EXTREMITY EVALUATION   Patient Name: Ronald Bond MRN: [id number] DOB:[birth year], 77 y.o., male Today's Date: 03/03/2023  END OF SESSION:  PT End of Session - 03/03/23 1110     Visit Number 1    Number of Visits 12    Date for PT Re-Evaluation 04/14/23    Authorization Type BCBS, Medicare & Devoted    Progress Note Due on Visit 10    PT Start Time 1105    PT Stop Time 1151    PT Time Calculation (min) 46 min    Activity Tolerance Patient limited by pain    Behavior During Therapy [facility] for tasks assessed/performed             Past Medical History:  Diagnosis Date   Anemia    Chronic kidney disease    GERD (gastroesophageal reflux disease)    HTN (hypertension)    Hyperlipidemia    Hypothyroidism    Osteoarthritis    Pneumonia    Pre-diabetes    Sleep apnea    no CPAP   Wears glasses    Past Surgical History:  Procedure Laterality Date   COLONOSCOPY     EYE SURGERY  2006   both cataracts   MICROLARYNGOSCOPY Left 11/18/2013   Procedure: MICROLARYNGOSCOPY WITH REMOVAL OF GRANULOMA LEFT SIDE;  Surgeon: Serena Colonel, MD;  Location: [facility];  Service: ENT;  Laterality: Left;   NM MYOVIEW LTD  06/2007   Low risk, normal.  No evidence of ischemia or infarction   THYROIDECTOMY  1974   TONSILLECTOMY     as a child   TOTAL KNEE ARTHROPLASTY  2009   left   TOTAL KNEE ARTHROPLASTY Right 02/27/2023   Procedure: TOTAL KNEE ARTHROPLASTY;  Surgeon: Joen Laura, MD;  Location: [facility];  Service: Orthopedics;  Laterality: Right;   TOTAL KNEE REVISION Left 07/06/2022   Procedure: TOTAL KNEE REVISION;  Surgeon: Joen Laura, MD;  Location: [facility];  Service: Orthopedics;  Laterality: Left;   Patient Active Problem List   Diagnosis Date Noted   PAC (premature atrial contraction) 12/27/2021   Encounter for general adult medical examination with abnormal findings 06/22/2021   Bradycardia 11/30/2020   Graves'  orbitopathy 07/13/2020   Type II diabetes mellitus with manifestations 05/13/2020   Graves disease 10/07/2019   Postablative hypothyroidism 10/07/2019   Thyroid-related proptosis 08/15/2019   Chronic renal disease, stage 3, moderately decreased glomerular filtration rate (GFR) between 30-59 mL/min/1.73 square meter 08/15/2019   Long-term current use of opiate analgesic 05/14/2018   Bilateral epiphora 11/16/2017   Thiamine deficiency 06/08/2017   Obesity (BMI 30.0-34.9) 06/04/2017   Hypertensive heart disease without CHF (congestive heart failure) 12/25/2016   Right cervical radiculopathy 06/16/2016   Iron deficiency anemia 03/08/2016   Spinal stenosis in cervical region 08/25/2015   Allergic eczema 08/07/2014   Obstructive sleep apnea 11/12/2013   Mild intermittent asthma 06/03/2013   Herniated lumbar disc without myelopathy 10/18/2012   Allergic rhinitis 03/10/2011   Cardiomegaly 10/18/2010   ERECTILE DYSFUNCTION, NON-ORGANIC, MILD 06/29/2009   Hyperlipidemia associated with type 2 diabetes mellitus 05/28/2009   Essential hypertension, benign 05/28/2009   GERD 05/28/2009   BPH associated with nocturia 05/28/2009   Primary osteoarthritis of right knee 05/28/2009    PCP: [name], MD  REFERRING PROVIDER: Joen Laura, MD   REFERRING DIAG: s/p R TKA  THERAPY DIAG:  Acute pain of right knee  Stiffness of right knee, not  elsewhere classified  Localized edema  Muscle weakness (generalized)  Difficulty in walking, not elsewhere classified  Rationale for Evaluation and Treatment: Rehabilitation  ONSET DATE: 02/27/23  SUBJECTIVE:   SUBJECTIVE STATEMENT: Patient had surgery on 02/27/23.  He had a lot of difficulty after surgery with pain control and was not discharged until 03/01/23.  He has CPM machine at home, uses 4x/day 2 hours each session.  He was given handouts for exercises but these were never reviewed.  Daughter who lives with him is HHPT.   PERTINENT  HISTORY: T2DM, CKD, HTN, Hypothyroidism, GERD, Bradycardia, history neck and back pain.  PAIN:  Are you having pain? Yes: NPRS scale: 6-7/10 Pain location: R knee  Pain description: throbbing  Aggravating factors: sitting Relieving factors: moving, ice  PRECAUTIONS: None  WEIGHT BEARING RESTRICTIONS: No  FALLS:  Has patient fallen in last 6 months? 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 Arthroplasty components appear in their expected alignment. No periprosthetic fracture is identified. Expected postoperative changes within the overlying soft tissues.  PATIENT SURVEYS:  LEFS 0/80  COGNITION: Overall cognitive status: Within functional limits for tasks assessed     SENSATION: WFL  EDEMA:  Circumferential: right 49 cm; left 47.5  cm  MUSCLE LENGTH: NT  POSTURE: No Significant postural limitations  PALPATION: Tender calf but no swelling, warmth or redness.  No bleeding under bandage.  Incision covered by hydrocolloid dressing.    LOWER EXTREMITY ROM:  Passive ROM Right eval Left eval  Knee flexion 75 120  Knee extension Lacking 15 0   (Blank rows = not tested)  LOWER EXTREMITY MMT:  MMT Right eval Left eval  Hip flexion 2+ 4+  Hip extension    Hip abduction 5 5  Hip adduction 5 5  Hip internal rotation    Hip external rotation    Knee  flexion 3 5  Knee extension 2+ 5  Ankle dorsiflexion 4+ 5  Ankle plantarflexion    Ankle inversion    Ankle eversion     (Blank rows = not tested)  LOWER EXTREMITY SPECIAL TESTS:    FUNCTIONAL TESTS:  30 seconds chair stand test 1 rep. Gait speed 0.33m/s  GAIT: Distance walked: 50' Assistive device utilized: Environmental consultant - 2 wheeled Level of assistance: Modified independence Comments: R knee flexion throughout stance phase, decreased weight shift to R, decreased gait speed   TODAY'S TREATMENT:  DATE:   03/03/23 Therapeutic Exercise: to improve strength and mobility.  Demo, verbal and tactile cues throughout for technique. Review of supine exercises for HEP Quad sets with towel roll under knee - tactile cues needed 10 x 5 sec hold Ankle pumps Heel slides with strap - modA needed Hip abduction x 10 - minA SLR x 5 - modA needed Vasopneumatic: Gameready at end of session for post session soreness/edema. 10 min, low compression, 34 deg (coldest).     PATIENT EDUCATION:  Education details: HEP, plan of care, pain management Person educated: Patient Education method: Explanation, Demonstration, Verbal cues, and Handouts Education comprehension: verbalized understanding and returned demonstration  HOME EXERCISE PROGRAM: Access Code: [email or web address] URL: [email or web address] Date: 03/03/2023 Prepared by: Harrie ForemanElizabeth Ayansh Feutz  Exercises - Supine Ankle Pumps  - 12 x daily - 7 x weekly - 10 reps - Supine Heel Slide  - 3 x daily - 7 x weekly - 2 sets - 10 reps - Supine Quad Set  - 3 x daily - 7 x weekly - 2 sets - 10 reps - 5s hold - Supine Active Straight Leg Raise  - 3 x daily - 7 x weekly - 2 sets - 10 reps - Supine Hip Abduction  - 31 x daily - 7 x weekly - 2 sets - 10 reps - Seated Knee Flexion AAROM  - 3 x daily - 7 x weekly - 2 sets - 10 reps - 5s  hold - Seated Knee Flexion  - 3 x daily - 7 x weekly - 2 sets - 10 reps - 5s hold - Seated Long Arc Quad  - 3 x daily - 7 x weekly - 2 sets - 10 reps - 5s hold  ASSESSMENT:  CLINICAL IMPRESSION: Patient is a 77 y.o. male who was seen today for physical therapy evaluation and treatment for s/p R TKA on 02/27/23.  His post-op progress has been complicated by poor pain control resulting in extended hospital stay and still reporting increased pain.  He is using CPM machine consistently at home, and measured 15-75 deg R knee ROM today.  (CPM set to 60 deg flexion so advised increasing that setting).  Today we reviewed his exercises.  Noted poor quad activation on right.  Ronald Bond would benefit from skilled physical therapy to improve pain, improve R knee ROM, strength, gait and balance in order to return to PLOF.    OBJECTIVE IMPAIRMENTS: Abnormal gait, decreased activity tolerance, decreased balance, decreased endurance, decreased mobility, difficulty walking, decreased ROM, decreased strength, increased edema, increased fascial restrictions, increased muscle spasms, and pain.   ACTIVITY LIMITATIONS: carrying, lifting, bending, sitting, standing, squatting, sleeping, stairs, transfers, bed mobility, and locomotion level  PARTICIPATION LIMITATIONS: meal prep, cleaning, laundry, driving, shopping, community activity, and occupation  PERSONAL FACTORS: Age, Past/current experiences, and 3+ comorbidities: T2DM, CKD, HTN, Hypothyroidism, GERD, Bradycardia, history neck and back pain  are also affecting patient's functional outcome.   REHAB POTENTIAL: Good  CLINICAL DECISION MAKING: Evolving/moderate complexity  EVALUATION COMPLEXITY: Moderate   GOALS: Goals reviewed with patient? Yes   SHORT TERM GOALS: Target date: 03/17/2023    Independent with initial HEP. Baseline: reviewed Goal status: INITIAL   LONG TERM GOALS: Target date: 04/14/2023   Independent with advanced/ongoing HEP to  improve outcomes and carryover.  Baseline:  Goal status: INITIAL  2.  Ronald Bond will demonstrate R knee flexion to 120 deg to ascend/descend stairs. Baseline: 75 Goal status: INITIAL  3.  Ronald Bond  will demonstrate full R knee extension for safety with gait. Baseline: lacking 15 Goal status: INITIAL  4.  Ronald Bond will be able to ambulate 600' safely with LRAD and normal gait pattern to access community.  Baseline: slow antalgic with RW Goal status: INITIAL  5.  Ronald Bond will be able to ascend/descend stairs with 1 HR and reciprocal step pattern safely to access home and community.  Baseline: unable Goal status: INITIAL  6.  Ronald Bond will demonstrate > 19/24 on DGI to demonstrate decreased risk of falls.   Baseline: NT Goal status: INITIAL  7.  Ronald Bond will demonstrate improved functional LE strength by completing 5x STS in <20 seconds.   Baseline: 25 sec to complete 1 sit to stand Goal status: INITIAL  8.  Ronald Bond will demonstrate 30/80 on LEFS to demonstrate improved mobility.  Baseline: 0/80 Goal status: INITIAL  PLAN:  PT FREQUENCY: 2x/week  PT DURATION: 6 weeks  PLANNED INTERVENTIONS: Therapeutic exercises, Therapeutic activity, Neuromuscular re-education, Balance training, Gait training, Patient/Family education, Self Care, Joint mobilization, Stair training, Orthotic/Fit training, Electrical stimulation, Cryotherapy, Moist heat, Taping, Vasopneumatic device, Manual therapy, and Re-evaluation  PLAN FOR NEXT SESSION: progress exercises, ROM and gait training as tolerated.  Manual therapy and modalities PRN.    Jena GaussElizabeth J Jahzaria Vary, PT, DPT  03/03/2023, 12:09 PM

## 2023-03-06 ENCOUNTER — Encounter: Payer: Self-pay | Admitting: Physical Therapy

## 2023-03-06 ENCOUNTER — Ambulatory Visit: Payer: BC Managed Care – PPO | Admitting: Physical Therapy

## 2023-03-06 DIAGNOSIS — M6281 Muscle weakness (generalized): Secondary | ICD-10-CM

## 2023-03-06 DIAGNOSIS — M25661 Stiffness of right knee, not elsewhere classified: Secondary | ICD-10-CM

## 2023-03-06 DIAGNOSIS — R600 Localized edema: Secondary | ICD-10-CM

## 2023-03-06 DIAGNOSIS — M25561 Pain in right knee: Secondary | ICD-10-CM

## 2023-03-06 DIAGNOSIS — R262 Difficulty in walking, not elsewhere classified: Secondary | ICD-10-CM

## 2023-03-06 NOTE — Therapy (Signed)
OUTPATIENT PHYSICAL THERAPY TREATMENT   Patient Name: Ronald Bond MRN: [id number] DOB:14-Sep-1946, 77 y.o., male Today's Date: 03/06/2023  END OF SESSION:  PT End of Session - 03/06/23 1113     Visit Number 2    Number of Visits 12    Date for PT Re-Evaluation 04/14/23    Authorization Type BCBS, Medicare & Devoted    Progress Note Due on Visit 10    PT Start Time 1109    PT Stop Time 1153    PT Time Calculation (min) 44 min    Activity Tolerance Patient limited by pain    Behavior During Therapy [facility] for tasks assessed/performed             Past Medical History:  Diagnosis Date   Anemia    Chronic kidney disease    GERD (gastroesophageal reflux disease)    HTN (hypertension)    Hyperlipidemia    Hypothyroidism    Osteoarthritis    Pneumonia    Pre-diabetes    Sleep apnea    no CPAP   Wears glasses    Past Surgical History:  Procedure Laterality Date   COLONOSCOPY     EYE SURGERY  2006   both cataracts   MICROLARYNGOSCOPY Left 11/18/2013   Procedure: MICROLARYNGOSCOPY WITH REMOVAL OF GRANULOMA LEFT SIDE;  Surgeon: Serena Colonel, MD;  Location: [facility];  Service: ENT;  Laterality: Left;   NM MYOVIEW LTD  06/2007   Low risk, normal.  No evidence of ischemia or infarction   THYROIDECTOMY  1974   TONSILLECTOMY     as a child   TOTAL KNEE ARTHROPLASTY  2009   left   TOTAL KNEE ARTHROPLASTY Right 02/27/2023   Procedure: TOTAL KNEE ARTHROPLASTY;  Surgeon: Joen Laura, MD;  Location: [facility];  Service: Orthopedics;  Laterality: Right;   TOTAL KNEE REVISION Left 07/06/2022   Procedure: TOTAL KNEE REVISION;  Surgeon: Joen Laura, MD;  Location: [facility];  Service: Orthopedics;  Laterality: Left;   Patient Active Problem List   Diagnosis Date Noted   PAC (premature atrial contraction) 12/27/2021   Encounter for general adult medical examination with abnormal findings 06/22/2021   Bradycardia 11/30/2020   Graves' orbitopathy 07/13/2020    Type II diabetes mellitus with manifestations 05/13/2020   Graves disease 10/07/2019   Postablative hypothyroidism 10/07/2019   Thyroid-related proptosis 08/15/2019   Chronic renal disease, stage 3, moderately decreased glomerular filtration rate (GFR) between 30-59 mL/min/1.73 square meter 08/15/2019   Long-term current use of opiate analgesic 05/14/2018   Bilateral epiphora 11/16/2017   Thiamine deficiency 06/08/2017   Obesity (BMI 30.0-34.9) 06/04/2017   Hypertensive heart disease without CHF (congestive heart failure) 12/25/2016   Right cervical radiculopathy 06/16/2016   Iron deficiency anemia 03/08/2016   Spinal stenosis in cervical region 08/25/2015   Allergic eczema 08/07/2014   Obstructive sleep apnea 11/12/2013   Mild intermittent asthma 06/03/2013   Herniated lumbar disc without myelopathy 10/18/2012   Allergic rhinitis 03/10/2011   Cardiomegaly 10/18/2010   ERECTILE DYSFUNCTION, NON-ORGANIC, MILD 06/29/2009   Hyperlipidemia associated with type 2 diabetes mellitus 05/28/2009   Essential hypertension, benign 05/28/2009   GERD 05/28/2009   BPH associated with nocturia 05/28/2009   Primary osteoarthritis of right knee 05/28/2009    PCP: [name], MD  REFERRING PROVIDER: Joen Laura, MD   REFERRING DIAG: s/p R TKA  THERAPY DIAG:  Acute pain of right knee  Stiffness of right knee, not elsewhere classified  Localized edema  Muscle weakness (generalized)  Difficulty in walking, not elsewhere classified  Rationale for Evaluation and Treatment: Rehabilitation  ONSET DATE: 02/27/23  SUBJECTIVE:   SUBJECTIVE STATEMENT: Ronald CoilJames T Bond still reporting a lot of pain, the CPM machine puts a lot of pressure on his calf, hurt too much yesterday to use after the first session.  He reports he actually requested the CPM machine, his doctor was not going to order.   PERTINENT HISTORY: T2DM, CKD, HTN, Hypothyroidism, GERD, Bradycardia, history neck and  back pain.  PAIN:  Are you having pain? Yes: NPRS scale: 5/10 Pain location: R knee  Pain description: throbbing  Aggravating factors: sitting Relieving factors: moving, ice  PRECAUTIONS: None  WEIGHT BEARING RESTRICTIONS: No  FALLS:  Has patient fallen in last 6 months? 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 Arthroplasty components appear in their expected alignment. No periprosthetic fracture is identified. Expected postoperative changes within the overlying soft tissues.  PATIENT SURVEYS:  LEFS 0/80  COGNITION: Overall cognitive status: Within functional limits for tasks assessed     SENSATION: WFL  EDEMA:  Circumferential: right 49 cm; left 47.5  cm  MUSCLE LENGTH: NT  POSTURE: No Significant postural limitations  PALPATION: Tender calf but no swelling, warmth or redness.  No bleeding under bandage.  Incision covered by hydrocolloid dressing.    LOWER EXTREMITY ROM:  Passive ROM Right eval Left eval  Knee flexion 75 120  Knee extension Lacking 15 0   (Blank rows = not tested)  LOWER EXTREMITY MMT:  MMT Right eval Left eval  Hip flexion 2+ 4+  Hip extension    Hip abduction 5 5  Hip adduction 5 5  Hip internal rotation    Hip external rotation    Knee flexion 3 5  Knee extension 2+ 5  Ankle dorsiflexion 4+ 5  Ankle plantarflexion     Ankle inversion    Ankle eversion     (Blank rows = not tested)  LOWER EXTREMITY SPECIAL TESTS:    FUNCTIONAL TESTS:  30 seconds chair stand test 1 rep. Gait speed 0.6861m/s  GAIT: Distance walked: 50' Assistive device utilized: Environmental consultantWalker - 2 wheeled Level of assistance: Modified independence Comments: R knee flexion throughout stance phase, decreased weight shift to R, decreased gait speed   TODAY'S TREATMENT:  DATE:   03/06/23 Therapeutic Exercise: to improve strength and mobility.  Demo, verbal and tactile cues throughout for technique. Nustep L5 x 6 min  Seated LAQ 2 x 10 RLE Seated quad sets 10 x 5 sec hold RLE Seated heel slides x 10 RLE Supine quad sets 10 x 5 sec hold RLE towel under knee with tractile cues Supine hamstring curls with feet on p-ball and strap to assist x 10 Vasopneumatic: Gameready at end of session for post session soreness/edema. 10 min, low compression, 34 deg (coldest).   03/03/23 Therapeutic Exercise: to improve strength and mobility.  Demo, verbal and tactile cues throughout for technique. Review of supine exercises for HEP Quad sets with towel roll under knee - tactile cues needed 10 x 5 sec hold Ankle pumps Heel slides with strap - modA needed Hip abduction x 10 - minA SLR x 5 - modA needed Vasopneumatic: Gameready at end of session for post session soreness/edema. 10 min, low compression, 34 deg (coldest).     PATIENT EDUCATION:  Education details: HEP review and update Person educated: Patient Education method: Explanation, Demonstration, Verbal cues, and Handouts Education comprehension: verbalized understanding and returned demonstration  HOME EXERCISE PROGRAM: Access Code: [email or web address] URL: [email or web address] Date: 03/06/2023 Prepared by: Harrie Foreman  Exercises - Supine Ankle Pumps  - 12 x  daily - 7 x weekly - 10 reps - Supine Heel Slide  - 3 x daily - 7 x weekly - 2 sets - 10 reps - Supine Quad Set  - 3 x daily - 7 x weekly - 2 sets - 10 reps - 5s hold - Supine Active Straight Leg Raise  - 3 x daily - 7 x weekly - 2 sets - 10 reps - Supine Hip Abduction  - 31 x daily - 7 x weekly - 2 sets - 10 reps - Seated Knee Flexion AAROM  - 3 x daily - 7 x weekly - 2 sets - 10 reps - 5s hold - Seated Long Arc Quad  - 3 x daily - 7 x weekly - 2 sets - 10 reps - 5s hold - Seated Quad Set  - 3 x daily - 7 x weekly - 2 sets - 10 reps - 5 sec  hold - Seated Knee Flexion Stretch  - 3 x daily - 7 x weekly - 1 sets - 10 reps - 5 sec  hold  ASSESSMENT:  CLINICAL IMPRESSION: Ronald Bond continues to report increased pain, especially in calf with CPM machine use.  Since the CPM machine was something that he had requested, not part of his orthopedist's protocol, did discuss that evidence did not support need for CPM and would make good progress if he performed HEP at least 3x/day.  He also has a rocking machine at home, so advised to sit in it with knee bent back and use this motion to help keep knee bending more comfortably, working it back as it relaxed.  Reviewed exercises, noted improved activation today in R quad with LAQ, added seated quad set.  Able to flex knee to 84 deg today.  Ronald Bond continues to demonstrate potential for improvement and would benefit from continued skilled therapy to address impairments.      OBJECTIVE IMPAIRMENTS: Abnormal gait, decreased activity tolerance, decreased balance, decreased endurance, decreased mobility, difficulty walking, decreased ROM, decreased strength, increased edema, increased fascial restrictions, increased muscle spasms, and pain.   ACTIVITY LIMITATIONS: carrying, lifting, bending, sitting, standing, squatting, sleeping, stairs, transfers, bed  mobility, and locomotion level  PARTICIPATION LIMITATIONS: meal prep, cleaning, laundry,  driving, shopping, community activity, and occupation  PERSONAL FACTORS: Age, Past/current experiences, and 3+ comorbidities: T2DM, CKD, HTN, Hypothyroidism, GERD, Bradycardia, history neck and back pain  are also affecting patient's functional outcome.   REHAB POTENTIAL: Good  CLINICAL DECISION MAKING: Evolving/moderate complexity  EVALUATION COMPLEXITY: Moderate   GOALS: Goals reviewed with patient? Yes   SHORT TERM GOALS: Target date: 03/17/2023    Independent with initial HEP. Baseline: reviewed Goal status: IN PROGRESS 03/06/23- reviewed   LONG TERM GOALS: Target date: 04/14/2023   Independent with advanced/ongoing HEP to improve outcomes and carryover.  Baseline:  Goal status: IN PROGRESS  2.  Ronald Bond will demonstrate R knee flexion to 120 deg to ascend/descend stairs. Baseline: 75 Goal status: IN PROGRESS 03/06/23- 84  3.  Ronald Bond will demonstrate full R knee extension for safety with gait. Baseline: lacking 15 Goal status: IN PROGRESS  4.  Ronald Bond will be able to ambulate 600' safely with LRAD and normal gait pattern to access community.  Baseline: slow antalgic with RW Goal status: IN PROGRESS  5.  Ronald Bond will be able to ascend/descend stairs with 1 HR and reciprocal step pattern safely to access home and community.  Baseline: unable Goal status: IN PROGRESS  6.  Ronald Bond will demonstrate > 19/24 on DGI to demonstrate decreased risk of falls.   Baseline: NT Goal status: IN PROGRESS  7.  Ronald Bond will demonstrate improved functional LE strength by completing 5x STS in <20 seconds.   Baseline: 25 sec to complete 1 sit to stand Goal status: IN PROGRESS  8.  Ronald Bond will demonstrate 30/80 on LEFS to demonstrate improved mobility.  Baseline: 0/80 Goal status: IN PROGRESS  PLAN:  PT FREQUENCY: 2x/week  PT DURATION: 6 weeks  PLANNED INTERVENTIONS: Therapeutic exercises, Therapeutic activity,  Neuromuscular re-education, Balance training, Gait training, Patient/Family education, Self Care, Joint mobilization, Stair training, Orthotic/Fit training, Electrical stimulation, Cryotherapy, Moist heat, Taping, Vasopneumatic device, Manual therapy, and Re-evaluation  PLAN FOR NEXT SESSION: progress exercises, ROM and gait training as tolerated.  Manual therapy and modalities PRN.    Jena Gauss, PT, DPT  03/06/2023, 11:54 AM

## 2023-03-07 ENCOUNTER — Telehealth: Payer: Self-pay | Admitting: *Deleted

## 2023-03-07 ENCOUNTER — Encounter: Payer: Self-pay | Admitting: *Deleted

## 2023-03-07 NOTE — Patient Outreach (Signed)
  Care Coordination   Post-TOC Care Coordination Telephone  Visit Note   03/07/2023 Name: Ronald Bond MRN: [id number] DOB: [birth year]  Ronald Bond is a 77 y.o. year old male who sees Ronald Grandchild, MD for primary care. I spoke with  Ronald Bond by phone today.  What matters to the patients health and wellness today?  "Thank you for calling me back so quickly; I just don't know what to do about this ongoing pain after my surgery-- is is just excruciating-- it is making it hard for me to do the PT and hard for me to do anything at all.  I did make an appointment with the surgeon, it is scheduled for 03/14/23.  I will call them as you have suggested to inform them that this pain is unmanageable; I hope there is nothing wrong"    Goals Addressed             This Visit's Progress    Care Coordination Activities: No follow up required       Interventions Today    Flowsheet Row Most Recent Value  Chronic Disease   Chronic disease during today's visit Other, Diabetes  [Recent (R) TKR 02/27/23]  General Interventions   General Interventions Discussed/Reviewed General Interventions Discussed, Doctor Visits  [re- provided my direct contact information should questions/ concerns/ needs arise post-care coordination call, patient continues to deny need for ongoing care coordination outreach]  Doctor Visits Discussed/Reviewed Specialist  [confirmed has scheduled post-op surgical provider office visit on 03/14/23]  PCP/Specialist Visits Compliance with follow-up visit  Education Interventions   Education Provided Provided Education  Provided Verbal Education On When to see the doctor, Other  [need to contact surgical provider for ongoing unmanageable pain at home post recent surgical TKR,  need to discuss pain with provider,  need to discuss difficulty in performing PT/ self-care activities due to ongoing pain]             SDOH assessments and interventions completed:  Yes Previously  completed at time of TOC call on 03/02/23   Care Coordination Interventions:  Yes, provided   Follow up plan: No further intervention required.   Encounter Outcome:  Pt. Visit Completed   Caryl Pina, RN, BSN, CCRN Alumnus RN CM Care Coordination/ Transition of Care- [facility] Management [phone]: direct office

## 2023-03-09 ENCOUNTER — Ambulatory Visit: Payer: BC Managed Care – PPO

## 2023-03-09 DIAGNOSIS — M25561 Pain in right knee: Secondary | ICD-10-CM | POA: Diagnosis not present

## 2023-03-09 DIAGNOSIS — M6281 Muscle weakness (generalized): Secondary | ICD-10-CM

## 2023-03-09 DIAGNOSIS — M25661 Stiffness of right knee, not elsewhere classified: Secondary | ICD-10-CM

## 2023-03-09 DIAGNOSIS — R262 Difficulty in walking, not elsewhere classified: Secondary | ICD-10-CM

## 2023-03-09 DIAGNOSIS — R600 Localized edema: Secondary | ICD-10-CM

## 2023-03-09 NOTE — Therapy (Signed)
OUTPATIENT PHYSICAL THERAPY TREATMENT   Patient Name: Ronald CoilJames T Bond MRN: [id number] DOB:02/26/1946, 77 y.o., male Today's Date: 03/09/2023  END OF SESSION:  PT End of Session - 03/09/23 1053     Visit Number 3    Number of Visits 12    Date for PT Re-Evaluation 04/14/23    Authorization Type BCBS, Medicare & [employer]    Progress Note Due on Visit 10    PT Start Time 1058    PT Stop Time 1154    PT Time Calculation (min) 56 min    Activity Tolerance Patient limited by pain    Behavior [facility] for tasks assessed/performed              Past Medical History:  Diagnosis Date   Anemia    Chronic kidney disease    GERD (gastroesophageal reflux disease)    HTN (hypertension)    Hyperlipidemia    Hypothyroidism    Osteoarthritis    Pneumonia    Pre-diabetes    Sleep apnea    no CPAP   Wears glasses    Past Surgical History:  Procedure Laterality Date   COLONOSCOPY     EYE SURGERY  2006   both cataracts   MICROLARYNGOSCOPY Left 11/18/2013   Procedure: MICROLARYNGOSCOPY WITH REMOVAL OF GRANULOMA LEFT SIDE;  Surgeon: Serena ColonelJefry Rosen, MD;  Location: [facility];  Service: ENT;  Laterality: Left;   NM MYOVIEW LTD  06/2007   Low risk, normal.  No evidence of ischemia or infarction   THYROIDECTOMY  1974   TONSILLECTOMY     as a child   TOTAL KNEE ARTHROPLASTY  2009   left   TOTAL KNEE ARTHROPLASTY Right 02/27/2023   Procedure: TOTAL KNEE ARTHROPLASTY;  Surgeon: Joen LauraMarchwiany, Daniel A, MD;  Location: [facility];  Service: Orthopedics;  Laterality: Right;   TOTAL KNEE REVISION Left 07/06/2022   Procedure: TOTAL KNEE REVISION;  Surgeon: Joen LauraMarchwiany, Daniel A, MD;  Location: [facility];  Service: Orthopedics;  Laterality: Left;   Patient Active Problem List   Diagnosis Date Noted   PAC (premature atrial contraction) 12/27/2021   Encounter for general adult medical examination with abnormal findings 06/22/2021   Bradycardia 11/30/2020   Graves' orbitopathy  07/13/2020   Type II diabetes mellitus with manifestations 05/13/2020   Graves disease 10/07/2019   Postablative hypothyroidism 10/07/2019   Thyroid-related proptosis 08/15/2019   Chronic renal disease, stage 3, moderately decreased glomerular filtration rate (GFR) between 30-59 mL/min/1.73 square meter 08/15/2019   Long-term current use of opiate analgesic 05/14/2018   Bilateral epiphora 11/16/2017   Thiamine deficiency 06/08/2017   Obesity (BMI 30.0-34.9) 06/04/2017   Hypertensive heart disease without CHF (congestive heart failure) 12/25/2016   Right cervical radiculopathy 06/16/2016   Iron deficiency anemia 03/08/2016   Spinal stenosis in cervical region 08/25/2015   Allergic eczema 08/07/2014   Obstructive sleep apnea 11/12/2013   Mild intermittent asthma 06/03/2013   Herniated lumbar disc without myelopathy 10/18/2012   Allergic rhinitis 03/10/2011   Cardiomegaly 10/18/2010   ERECTILE DYSFUNCTION, NON-ORGANIC, MILD 06/29/2009   Hyperlipidemia associated with type 2 diabetes mellitus 05/28/2009   Essential hypertension, benign 05/28/2009   GERD 05/28/2009   BPH associated with nocturia 05/28/2009   Primary osteoarthritis of right knee 05/28/2009    PCP: Etta GrandchildJones, Thomas L, MD  REFERRING PROVIDER: Joen LauraMarchwiany, Daniel A, MD   REFERRING DIAG: s/p R TKA  THERAPY DIAG:  Acute pain of right knee  Stiffness of right knee, not elsewhere  classified  Localized edema  Muscle weakness (generalized)  Difficulty in walking, not elsewhere classified  Rationale for Evaluation and Treatment: Rehabilitation  ONSET DATE: 02/27/23  SUBJECTIVE:   SUBJECTIVE STATEMENT: Ronald Bond reports pain today slughtly elevated. He notes only being able to walk for about 5-10 min then having pain.   PERTINENT HISTORY: T2DM, CKD, HTN, Hypothyroidism, GERD, Bradycardia, history neck and back pain.  PAIN:  Are you having pain? Yes: NPRS scale: 7/10 Pain location: R knee  Pain description:  throbbing  Aggravating factors: sitting Relieving factors: moving, ice  PRECAUTIONS: None  WEIGHT BEARING RESTRICTIONS: No  FALLS:  Has patient fallen in last 6 months? 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 Arthroplasty components appear in their expected alignment. No periprosthetic fracture is identified. Expected postoperative changes within the overlying soft tissues.  PATIENT SURVEYS:  LEFS 0/80  COGNITION: Overall cognitive status: Within functional limits for tasks assessed     SENSATION: WFL  EDEMA:  Circumferential: right 49 cm; left 47.5  cm  MUSCLE LENGTH: NT  POSTURE: No Significant postural limitations  PALPATION: Tender calf but no swelling, warmth or redness.  No bleeding under bandage.  Incision covered by hydrocolloid dressing.    LOWER EXTREMITY ROM:  Passive ROM Right eval Left eval Right 03/09/23  Knee flexion 75 120 86  Knee extension Lacking 15 0 10   (Blank rows = not tested)  LOWER EXTREMITY MMT:  MMT Right eval Left eval  Hip flexion 2+ 4+  Hip extension    Hip abduction 5 5  Hip adduction 5 5  Hip internal rotation    Hip external rotation    Knee flexion 3 5  Knee extension 2+ 5  Ankle dorsiflexion 4+ 5  Ankle plantarflexion    Ankle inversion    Ankle eversion     (Blank rows = not tested)  LOWER EXTREMITY  SPECIAL TESTS:    FUNCTIONAL TESTS:  30 seconds chair stand test 1 rep. Gait speed 0.63m/s  GAIT: Distance walked: 50' Assistive device utilized: Environmental consultant - 2 wheeled Level of assistance: Modified independence Comments: R knee flexion throughout stance phase, decreased weight shift to R, decreased gait speed   TODAY'S TREATMENT:  DATE:  03/09/23 Therapeutic Exercise: to improve strength and mobility.  Demo, verbal and tactile cues throughout for technique. Nustep L5 x 6 min  Seated LAQ R x 10 Seated R Heel slides x 10 AROM; x 10 AAROM with strap Supine R quad sets with towel under knee x 20 Supine SLR with QS x 5 - quad lag after 5 reps  Manual Therapy: to decrease muscle spasm, pain and improve mobility.  Gentle PROM to R knee flexion and extension  Cold pack to R knee x 10 min   03/06/23 Therapeutic Exercise: to improve strength and mobility.  Demo, verbal and tactile cues throughout for technique. Nustep L5 x 6 min  Seated LAQ 2 x 10 RLE Seated quad sets 10 x 5 sec hold RLE Seated heel slides x 10 RLE Supine quad sets 10 x 5 sec hold RLE towel under knee with tractile cues Supine hamstring curls with feet on p-ball and strap to assist x 10 Vasopneumatic: Gameready at end of session for post session soreness/edema. 10 min, low compression, 34 deg (coldest).   03/03/23 Therapeutic Exercise: to improve strength and mobility.  Demo, verbal and tactile cues throughout for technique. Review of supine exercises for HEP Quad sets with towel roll under knee - tactile cues needed 10 x 5 sec hold Ankle pumps Heel slides with strap - modA needed Hip abduction x 10 - minA SLR x 5 - modA needed Vasopneumatic: Gameready at end of session for post session soreness/edema. 10 min, low compression, 34 deg (coldest).     PATIENT EDUCATION:  Education details: HEP review  and update Person educated: Patient Education method: Explanation, Demonstration, Verbal cues, and Handouts Education comprehension: verbalized understanding and returned demonstration  HOME EXERCISE PROGRAM: Access Code: [email or web address] URL: [email or web address] Date: 03/06/2023 Prepared by: Harrie Foreman  Exercises - Supine Ankle Pumps  - 12 x daily - 7 x weekly - 10 reps - Supine Heel Slide  - 3 x daily - 7 x weekly - 2 sets - 10 reps - Supine Quad Set  - 3 x daily - 7 x weekly - 2 sets - 10 reps - 5s hold - Supine Active Straight Leg Raise  - 3 x daily - 7 x weekly - 2 sets - 10 reps - Supine Hip Abduction  - 31 x daily - 7 x weekly - 2 sets - 10 reps - Seated Knee Flexion AAROM  - 3 x daily - 7 x weekly - 2 sets - 10 reps - 5s hold - Seated Long Arc Quad  - 3 x daily - 7 x weekly - 2 sets - 10 reps - 5s hold - Seated Quad Set  - 3 x daily - 7 x weekly - 2 sets - 10 reps - 5 sec  hold - Seated Knee Flexion Stretch  - 3 x daily - 7 x weekly - 1 sets - 10 reps - 5 sec  hold  ASSESSMENT:  CLINICAL IMPRESSION: Mr. Novacek remains limited with exercise tolerance d/t pain. We tried to progress the exercises as much as we could but we were unable to do much more than last visit. He showed less quad contraction with QS and quad lag after 5 SLR. He did have a good tolerance for gentle PROM but after he wanted to do ice, so we did an ice pack for 10 min post session to address swelling and pain.     OBJECTIVE IMPAIRMENTS: Abnormal gait, decreased activity tolerance, decreased balance, decreased endurance,  decreased mobility, difficulty walking, decreased ROM, decreased strength, increased edema, increased fascial restrictions, increased muscle spasms, and pain.   ACTIVITY LIMITATIONS: carrying, lifting, bending, sitting, standing, squatting, sleeping, stairs, transfers, bed mobility, and locomotion level  PARTICIPATION LIMITATIONS: meal prep, cleaning, laundry, driving,  shopping, community activity, and occupation  PERSONAL FACTORS: Age, Past/current experiences, and 3+ comorbidities: T2DM, CKD, HTN, Hypothyroidism, GERD, Bradycardia, history neck and back pain  are also affecting patient's functional outcome.   REHAB POTENTIAL: Good  CLINICAL DECISION MAKING: Evolving/moderate complexity  EVALUATION COMPLEXITY: Moderate   GOALS: Goals reviewed with patient? Yes   SHORT TERM GOALS: Target date: 03/17/2023    Independent with initial HEP. Baseline: reviewed Goal status: IN PROGRESS 03/06/23- reviewed   LONG TERM GOALS: Target date: 04/14/2023   Independent with advanced/ongoing HEP to improve outcomes and carryover.  Baseline:  Goal status: IN PROGRESS  2.  Ronald Bond will demonstrate R knee flexion to 120 deg to ascend/descend stairs. Baseline: 75 Goal status: IN PROGRESS 03/06/23- 84  3.  Ronald Bond will demonstrate full R knee extension for safety with gait. Baseline: lacking 15 Goal status: IN PROGRESS  4.  Ronald Bond will be able to ambulate 600' safely with LRAD and normal gait pattern to access community.  Baseline: slow antalgic with RW Goal status: IN PROGRESS  5.  Ronald Bond will be able to ascend/descend stairs with 1 HR and reciprocal step pattern safely to access home and community.  Baseline: unable Goal status: IN PROGRESS  6.  Ronald Bond will demonstrate > 19/24 on DGI to demonstrate decreased risk of falls.   Baseline: NT Goal status: IN PROGRESS  7.  Ronald Bond will demonstrate improved functional LE strength by completing 5x STS in <20 seconds.   Baseline: 25 sec to complete 1 sit to stand Goal status: IN PROGRESS  8.  Ronald Bond will demonstrate 30/80 on LEFS to demonstrate improved mobility.  Baseline: 0/80 Goal status: IN PROGRESS  PLAN:  PT FREQUENCY: 2x/week  PT DURATION: 6 weeks  PLANNED INTERVENTIONS: Therapeutic exercises, Therapeutic activity, Neuromuscular  re-education, Balance training, Gait training, Patient/Family education, Self Care, Joint mobilization, Stair training, Orthotic/Fit training, Electrical stimulation, Cryotherapy, Moist heat, Taping, Vasopneumatic device, Manual therapy, and Re-evaluation  PLAN FOR NEXT SESSION: progress exercises, ROM and gait training as tolerated.  Manual therapy and modalities PRN.    Darleene Cleaver, PTA 03/09/2023, 12:02 PM

## 2023-03-13 ENCOUNTER — Ambulatory Visit: Payer: BC Managed Care – PPO | Admitting: Physical Therapy

## 2023-03-13 ENCOUNTER — Encounter: Payer: Self-pay | Admitting: Physical Therapy

## 2023-03-13 DIAGNOSIS — M25561 Pain in right knee: Secondary | ICD-10-CM | POA: Diagnosis not present

## 2023-03-13 DIAGNOSIS — M25661 Stiffness of right knee, not elsewhere classified: Secondary | ICD-10-CM

## 2023-03-13 DIAGNOSIS — R262 Difficulty in walking, not elsewhere classified: Secondary | ICD-10-CM

## 2023-03-13 DIAGNOSIS — R600 Localized edema: Secondary | ICD-10-CM

## 2023-03-13 DIAGNOSIS — M6281 Muscle weakness (generalized): Secondary | ICD-10-CM

## 2023-03-13 NOTE — Therapy (Signed)
OUTPATIENT PHYSICAL THERAPY TREATMENT Progress Note Reporting Period 03/03/2023 to 03/13/2023  See note below for Objective Data and Assessment of Progress/Goals.      Patient Name: Ronald Bond MRN: [id number] DOB:[birth year], 77 y.o., male Today's Date: 03/13/2023  END OF SESSION:  PT End of Session - 03/13/23 1102     Visit Number 4    Number of Visits 12    Date for PT Re-Evaluation 04/14/23    Authorization Type BCBS, Medicare & [employer]    Progress Note Due on Visit 10    PT Start Time 1102    PT Stop Time 1154    PT Time Calculation (min) 52 min    Activity Tolerance Patient limited by pain    Behavior During Therapy [facility] for tasks assessed/performed              Past Medical History:  Diagnosis Date   Anemia    Chronic kidney disease    GERD (gastroesophageal reflux disease)    HTN (hypertension)    Hyperlipidemia    Hypothyroidism    Osteoarthritis    Pneumonia    Pre-diabetes    Sleep apnea    no CPAP   Wears glasses    Past Surgical History:  Procedure Laterality Date   COLONOSCOPY     EYE SURGERY  2006   both cataracts   MICROLARYNGOSCOPY Left 11/18/2013   Procedure: MICROLARYNGOSCOPY WITH REMOVAL OF GRANULOMA LEFT SIDE;  Surgeon: Serena Colonel, MD;  Location: [facility];  Service: ENT;  Laterality: Left;   NM MYOVIEW LTD  06/2007   Low risk, normal.  No evidence of ischemia or infarction   THYROIDECTOMY  1974   TONSILLECTOMY     as a child   TOTAL KNEE ARTHROPLASTY  2009   left   TOTAL KNEE ARTHROPLASTY Right 02/27/2023   Procedure: TOTAL KNEE ARTHROPLASTY;  Surgeon: Joen Laura, MD;  Location: [facility];  Service: Orthopedics;  Laterality: Right;   TOTAL KNEE REVISION Left 07/06/2022   Procedure: TOTAL KNEE REVISION;  Surgeon: Joen Laura, MD;  Location: [facility];  Service: Orthopedics;  Laterality: Left;   Patient Active Problem List   Diagnosis Date Noted   PAC (premature atrial contraction) 12/27/2021    Encounter for general adult medical examination with abnormal findings 06/22/2021   Bradycardia 11/30/2020   Graves' orbitopathy 07/13/2020   Type II diabetes mellitus with manifestations 05/13/2020   Graves disease 10/07/2019   Postablative hypothyroidism 10/07/2019   Thyroid-related proptosis 08/15/2019   Chronic renal disease, stage 3, moderately decreased glomerular filtration rate (GFR) between 30-59 mL/min/1.73 square meter 08/15/2019   Long-term current use of opiate analgesic 05/14/2018   Bilateral epiphora 11/16/2017   Thiamine deficiency 06/08/2017   Obesity (BMI 30.0-34.9) 06/04/2017   Hypertensive heart disease without CHF (congestive heart failure) 12/25/2016   Right cervical radiculopathy 06/16/2016   Iron deficiency anemia 03/08/2016   Spinal stenosis in cervical region 08/25/2015   Allergic eczema 08/07/2014   Obstructive sleep apnea 11/12/2013   Mild intermittent asthma 06/03/2013   Herniated lumbar disc without myelopathy 10/18/2012   Allergic rhinitis 03/10/2011   Cardiomegaly 10/18/2010   ERECTILE DYSFUNCTION, NON-ORGANIC, MILD 06/29/2009   Hyperlipidemia associated with type 2 diabetes mellitus 05/28/2009   Essential hypertension, benign 05/28/2009   GERD 05/28/2009   BPH associated with nocturia 05/28/2009   Primary osteoarthritis of right knee 05/28/2009    PCP: [name], MD  REFERRING PROVIDER: Joen Laura, MD  REFERRING DIAG: s/p R TKA  THERAPY DIAG:  Acute pain of right knee  Stiffness of right knee, not elsewhere classified  Localized edema  Muscle weakness (generalized)  Difficulty in walking, not elsewhere classified  Rationale for Evaluation and Treatment: Rehabilitation  ONSET DATE: 02/27/23  SUBJECTIVE:   SUBJECTIVE STATEMENT: Ronald Bond reports pain is better today.  Able to do some of his exercises.  CPM machine not working, stopped using on Friday.    PERTINENT HISTORY: T2DM, CKD, HTN, Hypothyroidism,  GERD, Bradycardia, history neck and back pain.  PAIN:  Are you having pain? Yes: NPRS scale: 3/10 Pain location: R knee  Pain description: throbbing  Aggravating factors: sitting Relieving factors: moving, ice  PRECAUTIONS: None  WEIGHT BEARING RESTRICTIONS: No  FALLS:  Has patient fallen in last 6 months? 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 Arthroplasty components appear in their expected alignment. No periprosthetic fracture is identified. Expected postoperative changes within the overlying soft tissues.  PATIENT SURVEYS:  LEFS 0/80  COGNITION: Overall cognitive status: Within functional limits for tasks assessed     SENSATION: WFL  EDEMA:  Circumferential: right 49 cm; left 47.5  cm  MUSCLE LENGTH: NT  POSTURE: No Significant postural limitations  PALPATION: Tender calf but no swelling, warmth or redness.  No bleeding under bandage.  Incision covered by hydrocolloid dressing.    LOWER EXTREMITY ROM:  Passive ROM Right eval Left eval Right 03/09/23 Right 03/13/23  Knee flexion 75 120 86 102  Knee extension Lacking 15 0 10 8   (Blank rows = not tested)  LOWER EXTREMITY MMT:  MMT Right eval Left eval  Hip flexion 2+ 4+  Hip extension    Hip abduction 5 5  Hip adduction 5 5  Hip internal rotation    Hip external rotation    Knee flexion  3 5  Knee extension 2+ 5  Ankle dorsiflexion 4+ 5  Ankle plantarflexion    Ankle inversion    Ankle eversion     (Blank rows = not tested)  LOWER EXTREMITY SPECIAL TESTS:    FUNCTIONAL TESTS:  30 seconds chair stand test 1 rep. Gait speed 0.29m/s  GAIT: Distance walked: 50' Assistive device utilized: Environmental consultant - 2 wheeled Level of assistance: Modified independence Comments: R knee flexion throughout stance phase, decreased weight shift to R, decreased gait speed   TODAY'S TREATMENT:  DATE:   03/13/2023 Therapeutic Exercise: to improve strength and mobility.  Demo, verbal and tactile cues throughout for technique. Nustep L5 x 6 min Gait x 180' with RW Seated LAQ R 2 x 5  STS x 5  Seated quad sets 5 x 5 sec hold Retro step x 3 - very challenging Church pews 2 x 5  Hamstring curls on p-ball with strap 2 x 5 SLR x 5 - fatigued quickly SAQ 2 x 10 minA at heel  Quad sets with ankle on bolster for TKE x 10  Vasopneumatic: Gameready at end of session for post session soreness/edema. 10 min, low compression, 34 deg (coldest).   03/09/23 Therapeutic Exercise: to improve strength and mobility.  Demo, verbal and tactile cues throughout for technique. Nustep L5 x 6 min  Seated LAQ R x 10 Seated R Heel slides x 10 AROM; x 10 AAROM with strap Supine R quad sets with towel under knee x 20 Supine SLR with QS x 5 - quad lag after 5 reps  Manual Therapy: to decrease muscle spasm, pain and improve mobility.  Gentle PROM to R knee flexion and extension  Cold pack to R knee x 10 min   03/06/23 Therapeutic Exercise: to improve strength and mobility.  Demo, verbal and tactile cues throughout for technique. Nustep L5 x 6 min  Seated LAQ 2 x 10 RLE Seated quad sets 10 x 5 sec hold RLE Seated heel slides x 10 RLE Supine quad sets 10 x 5 sec hold RLE towel under knee with  tractile cues Supine hamstring curls with feet on p-ball and strap to assist x 10 Vasopneumatic: Gameready at end of session for post session soreness/edema. 10 min, low compression, 34 deg (coldest).   03/03/23 Therapeutic Exercise: to improve strength and mobility.  Demo, verbal and tactile cues throughout for technique. Review of supine exercises for HEP Quad sets with towel roll under knee - tactile cues needed 10 x 5 sec hold Ankle pumps Heel slides with strap - modA needed Hip abduction x 10 - minA SLR x 5 - modA needed Vasopneumatic: Gameready at end of session for post session soreness/edema. 10 min, low compression, 34 deg (coldest).     PATIENT EDUCATION:  Education details: HEP review and update Person educated: Patient Education method: Explanation, Demonstration, Verbal cues, and Handouts Education comprehension: verbalized understanding and returned demonstration  HOME EXERCISE PROGRAM: Access Code: [email or web address] URL: [email or web address] Date: 03/13/2023 Prepared by: Harrie Foreman  Exercises - Supine Ankle Pumps  - 12 x daily - 7 x weekly - 10 reps - Supine Heel Slide  - 3 x daily - 7 x weekly - 2 sets - 10 reps - Supine Quad Set  - 3 x daily - 7 x weekly - 2 sets - 10 reps - 5s hold - Supine Active Straight Leg Raise  - 3 x daily - 7 x weekly - 2 sets - 10 reps - Supine Hip Abduction  - 31 x daily - 7 x weekly - 2 sets - 10 reps - Seated Knee Flexion AAROM  - 3 x daily - 7 x weekly - 2 sets - 10 reps - 5s hold - Seated Long Arc Quad  - 3 x daily - 7 x weekly - 2 sets - 10 reps - 5s hold - Seated Quad Set  - 3 x daily - 7 x weekly - 2 sets - 10 reps - 5 sec  hold - Seated Knee Flexion Stretch  - 3  x daily - 7 x weekly - 1 sets - 10 reps - 5 sec  hold - Supine Short Arc Quad  - 1 x daily - 7 x weekly - 2 sets - 10 reps - Church Pew  - 1 x daily - 7 x weekly - 2 sets - 10 reps - Backward Weight Shift and Opposite Arm Raise with Walker  - 1 x daily - 7 x  weekly - 2 sets - 10 reps  ASSESSMENT:  CLINICAL IMPRESSION: Mr. Grabe is still limited with exercise tolerance, fatiguing very quickly, but overall his pain is improving and R knee ROM continues to improve, measuring 8-102 today.  Discussed his CPM machine, he did note that his pain improved when he stopped using it, so urged him to discuss with surgeon whether or not to resume, since his ROM is improving and the CPM does not address strength.   Urged to focus on exercises and frequent walking when not using CPM to continue progress.  Ronald Bond continues to demonstrate potential for improvement and would benefit from continued skilled therapy to address impairments.     OBJECTIVE IMPAIRMENTS: Abnormal gait, decreased activity tolerance, decreased balance, decreased endurance, decreased mobility, difficulty walking, decreased ROM, decreased strength, increased edema, increased fascial restrictions, increased muscle spasms, and pain.   ACTIVITY LIMITATIONS: carrying, lifting, bending, sitting, standing, squatting, sleeping, stairs, transfers, bed mobility, and locomotion level  PARTICIPATION LIMITATIONS: meal prep, cleaning, laundry, driving, shopping, community activity, and occupation  PERSONAL FACTORS: Age, Past/current experiences, and 3+ comorbidities: T2DM, CKD, HTN, Hypothyroidism, GERD, Bradycardia, history neck and back pain  are also affecting patient's functional outcome.   REHAB POTENTIAL: Good  CLINICAL DECISION MAKING: Evolving/moderate complexity  EVALUATION COMPLEXITY: Moderate   GOALS: Goals reviewed with patient? Yes   SHORT TERM GOALS: Target date: 03/17/2023    Independent with initial HEP. Baseline: reviewed Goal status: IN PROGRESS 03/06/23- reviewed 03/13/23- partial compliance.    LONG TERM GOALS: Target date: 04/14/2023   Independent with advanced/ongoing HEP to improve outcomes and carryover.  Baseline:  Goal status: IN PROGRESS  2.  Ronald Bond will demonstrate R knee flexion to 120 deg to ascend/descend stairs. Baseline: 75 Goal status: IN PROGRESS 03/06/23- 84 03/13/23- 102  3.  Ronald Bond will demonstrate full R knee extension for safety with gait. Baseline: lacking 15 Goal status: IN PROGRESS 03/13/23- lacking 8  4.  Ronald Bond will be able to ambulate 600' safely with LRAD and normal gait pattern to access community.  Baseline: slow antalgic with RW Goal status: IN PROGRESS 03/13/23- improved heel strike and step length, RW  5.  Ronald Bond will be able to ascend/descend stairs with 1 HR and reciprocal step pattern safely to access home and community.  Baseline: unable Goal status: IN PROGRESS  6.  Ronald Bond will demonstrate > 19/24 on DGI to demonstrate decreased risk of falls.   Baseline: NT Goal status: IN PROGRESS  7.  Ronald Bond will demonstrate improved functional LE strength by completing 5x STS in <20 seconds.   Baseline: 25 sec to complete 1 sit to stand Goal status: IN PROGRESS  8.  Ronald Bond will demonstrate 30/80 on LEFS to demonstrate improved mobility.  Baseline: 0/80 Goal status: IN PROGRESS  PLAN:  PT FREQUENCY: 2x/week  PT DURATION: 6 weeks  PLANNED INTERVENTIONS: Therapeutic exercises, Therapeutic activity, Neuromuscular re-education, Balance training, Gait training, Patient/Family education, Self Care, Joint mobilization, Stair training, Orthotic/Fit training,  Electrical stimulation, Cryotherapy, Moist heat, Taping, Vasopneumatic device, Manual therapy, and Re-evaluation  PLAN FOR NEXT SESSION: progress exercises, ROM and gait training as tolerated.  Manual therapy and modalities PRN.    Jena Gauss, PT, DPT  03/13/2023, 12:00 PM

## 2023-03-14 DIAGNOSIS — M1711 Unilateral primary osteoarthritis, right knee: Secondary | ICD-10-CM | POA: Diagnosis not present

## 2023-03-16 ENCOUNTER — Ambulatory Visit: Payer: BC Managed Care – PPO

## 2023-03-16 DIAGNOSIS — M6281 Muscle weakness (generalized): Secondary | ICD-10-CM

## 2023-03-16 DIAGNOSIS — R262 Difficulty in walking, not elsewhere classified: Secondary | ICD-10-CM

## 2023-03-16 DIAGNOSIS — R600 Localized edema: Secondary | ICD-10-CM

## 2023-03-16 DIAGNOSIS — M25561 Pain in right knee: Secondary | ICD-10-CM | POA: Diagnosis not present

## 2023-03-16 DIAGNOSIS — M25661 Stiffness of right knee, not elsewhere classified: Secondary | ICD-10-CM

## 2023-03-16 NOTE — Therapy (Signed)
OUTPATIENT PHYSICAL THERAPY TREATMENT      Patient Name: Ronald Bond MRN: [id number] DOB:07/26/1946, 77 y.o., male Today's Date: 03/16/2023  END OF SESSION:  PT End of Session - 03/16/23 1323     Visit Number 5    Number of Visits 12    Date for PT Re-Evaluation 04/14/23    Authorization Type BCBS, Medicare & [employer]    Progress Note Due on Visit 10    PT Start Time 1109   pt late   PT Stop Time 1158    PT Time Calculation (min) 49 min    Activity Tolerance Patient limited by pain    Behavior During Therapy [facility] for tasks assessed/performed               Past Medical History:  Diagnosis Date   Anemia    Chronic kidney disease    GERD (gastroesophageal reflux disease)    HTN (hypertension)    Hyperlipidemia    Hypothyroidism    Osteoarthritis    Pneumonia    Pre-diabetes    Sleep apnea    no CPAP   Wears glasses    Past Surgical History:  Procedure Laterality Date   COLONOSCOPY     EYE SURGERY  2006   both cataracts   MICROLARYNGOSCOPY Left 11/18/2013   Procedure: MICROLARYNGOSCOPY WITH REMOVAL OF GRANULOMA LEFT SIDE;  Surgeon: Serena Colonel, MD;  Location: [facility];  Service: ENT;  Laterality: Left;   NM MYOVIEW LTD  06/2007   Low risk, normal.  No evidence of ischemia or infarction   THYROIDECTOMY  1974   TONSILLECTOMY     as a child   TOTAL KNEE ARTHROPLASTY  2009   left   TOTAL KNEE ARTHROPLASTY Right 02/27/2023   Procedure: TOTAL KNEE ARTHROPLASTY;  Surgeon: Joen Laura, MD;  Location: [facility];  Service: Orthopedics;  Laterality: Right;   TOTAL KNEE REVISION Left 07/06/2022   Procedure: TOTAL KNEE REVISION;  Surgeon: Joen Laura, MD;  Location: [facility];  Service: Orthopedics;  Laterality: Left;   Patient Active Problem List   Diagnosis Date Noted   PAC (premature atrial contraction) 12/27/2021   Encounter for general adult medical examination with abnormal findings 06/22/2021   Bradycardia 11/30/2020   Graves'  orbitopathy 07/13/2020   Type II diabetes mellitus with manifestations 05/13/2020   Graves disease 10/07/2019   Postablative hypothyroidism 10/07/2019   Thyroid-related proptosis 08/15/2019   Chronic renal disease, stage 3, moderately decreased glomerular filtration rate (GFR) between 30-59 mL/min/1.73 square meter 08/15/2019   Long-term current use of opiate analgesic 05/14/2018   Bilateral epiphora 11/16/2017   Thiamine deficiency 06/08/2017   Obesity (BMI 30.0-34.9) 06/04/2017   Hypertensive heart disease without CHF (congestive heart failure) 12/25/2016   Right cervical radiculopathy 06/16/2016   Iron deficiency anemia 03/08/2016   Spinal stenosis in cervical region 08/25/2015   Allergic eczema 08/07/2014   Obstructive sleep apnea 11/12/2013   Mild intermittent asthma 06/03/2013   Herniated lumbar disc without myelopathy 10/18/2012   Allergic rhinitis 03/10/2011   Cardiomegaly 10/18/2010   ERECTILE DYSFUNCTION, NON-ORGANIC, MILD 06/29/2009   Hyperlipidemia associated with type 2 diabetes mellitus 05/28/2009   Essential hypertension, benign 05/28/2009   GERD 05/28/2009   BPH associated with nocturia 05/28/2009   Primary osteoarthritis of right knee 05/28/2009    PCP: [name], MD  REFERRING PROVIDER: Joen Laura, MD   REFERRING DIAG: s/p R TKA  THERAPY DIAG:  Acute pain of right knee  Stiffness of right knee, not elsewhere classified  Localized edema  Muscle weakness (generalized)  Difficulty in walking, not elsewhere classified  Rationale for Evaluation and Treatment: Rehabilitation  ONSET DATE: 02/27/23  SUBJECTIVE:   SUBJECTIVE STATEMENT: [name] reports no changes in R knee thus far.   PERTINENT HISTORY: T2DM, CKD, HTN, Hypothyroidism, GERD, Bradycardia, history neck and back pain.  PAIN:  Are you having pain? Yes: NPRS scale: 3/10 Pain location: R knee  Pain description: throbbing  Aggravating factors: sitting Relieving  factors: moving, ice  PRECAUTIONS: None  WEIGHT BEARING RESTRICTIONS: No  FALLS:  Has patient fallen in last 6 months? 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 Arthroplasty components appear in their expected alignment. No periprosthetic fracture is identified. Expected postoperative changes within the overlying soft tissues.  PATIENT SURVEYS:  LEFS 0/80  COGNITION: Overall cognitive status: Within functional limits for tasks assessed     SENSATION: WFL  EDEMA:  Circumferential: right 49 cm; left 47.5  cm  MUSCLE LENGTH: NT  POSTURE: No Significant postural limitations  PALPATION: Tender calf but no swelling, warmth or redness.  No bleeding under bandage.  Incision covered by hydrocolloid dressing.    LOWER EXTREMITY ROM:  Passive ROM Right eval Left eval Right 03/09/23 Right 03/13/23  Knee flexion 75 120 86 102  Knee extension Lacking 15 0 10 8   (Blank rows = not tested)  LOWER EXTREMITY MMT:  MMT Right eval Left eval  Hip flexion 2+ 4+  Hip extension    Hip abduction 5 5  Hip adduction 5 5  Hip internal rotation    Hip external rotation    Knee flexion 3 5  Knee extension 2+ 5  Ankle dorsiflexion 4+ 5  Ankle plantarflexion    Ankle inversion    Ankle eversion     (Blank rows = not tested)  LOWER EXTREMITY SPECIAL TESTS:    FUNCTIONAL  TESTS:  30 seconds chair stand test 1 rep. Gait speed 0.40m/s  GAIT: Distance walked: 50' Assistive device utilized: Environmental consultant - 2 wheeled Level of assistance: Modified independence Comments: R knee flexion throughout stance phase, decreased weight shift to R, decreased gait speed   TODAY'S TREATMENT:  DATE:  03/16/2023 Therapeutic Exercise: to improve strength and mobility.  Demo, verbal and tactile cues throughout for technique. Gait x 180' with RW Seated heel slides x 15  Church pews 2x10 Retro step x 10 cues required for correct sequence of mvmt Standing marching x 5 bil at wall Standing toe raises at wall with TKE x 10 Hamstring curls on p-ball with strap 2x10  Manual Therapy: to decrease muscle spasm, pain and improve mobility.  PROM to R knee flexion and extension  Vasopneumatic: Gameready at end of session for post session soreness/edema. 10 min, low compression, 34 deg (coldest).  03/13/2023 Therapeutic Exercise: to improve strength and mobility.  Demo, verbal and tactile cues throughout for technique. Nustep L5 x 6 min Gait x 180' with RW Seated LAQ R 2 x 5  STS x 5  Seated quad sets 5 x 5 sec hold Retro step x 3 - very challenging Church pews 2 x 5  Hamstring curls on p-ball with strap 2 x 5 SLR x 5 - fatigued quickly SAQ 2 x 10 minA at heel  Quad sets with ankle on bolster for TKE x 10  Vasopneumatic: Gameready at end of session for post session soreness/edema. 10 min, low compression, 34 deg (coldest).   03/09/23 Therapeutic Exercise: to improve strength and mobility.  Demo, verbal and tactile cues throughout for technique. Nustep L5 x 6 min  Seated LAQ R x 10 Seated R Heel slides x 10 AROM; x 10 AAROM with strap Supine R quad sets with towel under knee x 20 Supine SLR with QS x 5 - quad lag after 5 reps  Manual Therapy: to decrease muscle  spasm, pain and improve mobility.  Gentle PROM to R knee flexion and extension  Cold pack to R knee x 10 min   03/06/23 Therapeutic Exercise: to improve strength and mobility.  Demo, verbal and tactile cues throughout for technique. Nustep L5 x 6 min  Seated LAQ 2 x 10 RLE Seated quad sets 10 x 5 sec hold RLE Seated heel slides x 10 RLE Supine quad sets 10 x 5 sec hold RLE towel under knee with tractile cues Supine hamstring curls with feet on p-ball and strap to assist x 10 Vasopneumatic: Gameready at end of session for post session soreness/edema. 10 min, low compression, 34 deg (coldest).   03/03/23 Therapeutic Exercise: to improve strength and mobility.  Demo, verbal and tactile cues throughout for technique. Review of supine exercises for HEP Quad sets with towel roll under knee - tactile cues needed 10 x 5 sec hold Ankle pumps Heel slides with strap - modA needed Hip abduction x 10 - minA SLR x 5 - modA needed Vasopneumatic: Gameready at end of session for post session soreness/edema. 10 min, low compression, 34 deg (coldest).     PATIENT EDUCATION:  Education details: HEP review and update Person educated: Patient Education method: Explanation, Demonstration, Verbal cues, and Handouts Education comprehension: verbalized understanding and returned demonstration  HOME EXERCISE PROGRAM: Access Code: [email or web address] URL: [email or web address] Date: 03/13/2023 Prepared by: Harrie Foreman  Exercises - Supine Ankle Pumps  - 12 x daily - 7 x weekly - 10 reps - Supine Heel Slide  - 3 x daily - 7 x weekly - 2 sets - 10 reps - Supine Quad Set  - 3 x daily - 7 x weekly - 2 sets - 10 reps - 5s hold - Supine Active Straight Leg Raise  - 3 x daily - 7 x weekly -  2 sets - 10 reps - Supine Hip Abduction  - 31 x daily - 7 x weekly - 2 sets - 10 reps - Seated Knee Flexion AAROM  - 3 x daily - 7 x weekly - 2 sets - 10 reps - 5s hold - Seated Long Arc Quad  - 3 x daily - 7 x  weekly - 2 sets - 10 reps - 5s hold - Seated Quad Set  - 3 x daily - 7 x weekly - 2 sets - 10 reps - 5 sec  hold - Seated Knee Flexion Stretch  - 3 x daily - 7 x weekly - 1 sets - 10 reps - 5 sec  hold - Supine Short Arc Quad  - 1 x daily - 7 x weekly - 2 sets - 10 reps - Church Pew  - 1 x daily - 7 x weekly - 2 sets - 10 reps - Backward Weight Shift and Opposite Arm Raise with Walker  - 1 x daily - 7 x weekly - 2 sets - 10 reps  ASSESSMENT:  CLINICAL IMPRESSION: Continued progressing exercises to emphasize quad contraction in weight bearing and knee ROM. Patient is limited with standing tolerance and needs tactile and verbal cues to facilitate quads to contract. Exercises tolerance was better today.   OBJECTIVE IMPAIRMENTS: Abnormal gait, decreased activity tolerance, decreased balance, decreased endurance, decreased mobility, difficulty walking, decreased ROM, decreased strength, increased edema, increased fascial restrictions, increased muscle spasms, and pain.   ACTIVITY LIMITATIONS: carrying, lifting, bending, sitting, standing, squatting, sleeping, stairs, transfers, bed mobility, and locomotion level  PARTICIPATION LIMITATIONS: meal prep, cleaning, laundry, driving, shopping, community activity, and occupation  PERSONAL FACTORS: Age, Past/current experiences, and 3+ comorbidities: T2DM, CKD, HTN, Hypothyroidism, GERD, Bradycardia, history neck and back pain  are also affecting patient's functional outcome.   REHAB POTENTIAL: Good  CLINICAL DECISION MAKING: Evolving/moderate complexity  EVALUATION COMPLEXITY: Moderate   GOALS: Goals reviewed with patient? Yes   SHORT TERM GOALS: Target date: 03/17/2023    Independent with initial HEP. Baseline: reviewed Goal status: IN PROGRESS 03/06/23- reviewed 03/13/23- partial compliance.    LONG TERM GOALS: Target date: 04/14/2023   Independent with advanced/ongoing HEP to improve outcomes and carryover.  Baseline:  Goal status: IN  PROGRESS  2.  [name] will demonstrate R knee flexion to 120 deg to ascend/descend stairs. Baseline: 75 Goal status: IN PROGRESS 03/06/23- 84 03/13/23- 102  3.  [name] will demonstrate full R knee extension for safety with gait. Baseline: lacking 15 Goal status: IN PROGRESS 03/13/23- lacking 8  4.  [name] will be able to ambulate 600' safely with LRAD and normal gait pattern to access community.  Baseline: slow antalgic with RW Goal status: IN PROGRESS 03/13/23- improved heel strike and step length, RW  5.  [name] will be able to ascend/descend stairs with 1 HR and reciprocal step pattern safely to access home and community.  Baseline: unable Goal status: IN PROGRESS  6.  CANIO WINOKUR will demonstrate > 19/24 on DGI to demonstrate decreased risk of falls.   Baseline: NT Goal status: IN PROGRESS  7.  [name] will demonstrate improved functional LE strength by completing 5x STS in <20 seconds.   Baseline: 25 sec to complete 1 sit to stand Goal status: IN PROGRESS  8.  [name] will demonstrate 30/80 on LEFS to demonstrate improved mobility.  Baseline: 0/80 Goal status: IN PROGRESS  PLAN:  PT  FREQUENCY: 2x/week  PT DURATION: 6 weeks  PLANNED INTERVENTIONS: Therapeutic exercises, Therapeutic activity, Neuromuscular re-education, Balance training, Gait training, Patient/Family education, Self Care, Joint mobilization, Stair training, Orthotic/Fit training, Electrical stimulation, Cryotherapy, Moist heat, Taping, Vasopneumatic device, Manual therapy, and Re-evaluation  PLAN FOR NEXT SESSION: progress exercises, ROM and gait training as tolerated.  Manual therapy and modalities PRN.    Darleene Cleaver, PTA 03/16/2023, 4:57 PM

## 2023-03-18 ENCOUNTER — Other Ambulatory Visit: Payer: Self-pay | Admitting: Internal Medicine

## 2023-03-18 DIAGNOSIS — K2100 Gastro-esophageal reflux disease with esophagitis, without bleeding: Secondary | ICD-10-CM

## 2023-03-20 ENCOUNTER — Ambulatory Visit: Payer: BC Managed Care – PPO

## 2023-03-20 ENCOUNTER — Other Ambulatory Visit: Payer: Self-pay | Admitting: Internal Medicine

## 2023-03-20 DIAGNOSIS — M25561 Pain in right knee: Secondary | ICD-10-CM | POA: Diagnosis not present

## 2023-03-20 DIAGNOSIS — M25661 Stiffness of right knee, not elsewhere classified: Secondary | ICD-10-CM

## 2023-03-20 DIAGNOSIS — M6281 Muscle weakness (generalized): Secondary | ICD-10-CM

## 2023-03-20 DIAGNOSIS — I119 Hypertensive heart disease without heart failure: Secondary | ICD-10-CM

## 2023-03-20 DIAGNOSIS — R600 Localized edema: Secondary | ICD-10-CM

## 2023-03-20 DIAGNOSIS — R262 Difficulty in walking, not elsewhere classified: Secondary | ICD-10-CM

## 2023-03-20 NOTE — Therapy (Signed)
OUTPATIENT PHYSICAL THERAPY TREATMENT      Patient Name: Ronald Bond MRN: [id number] DOB:27-Feb-1946, 77 y.o., male Today's Date: 03/20/2023  END OF SESSION:  PT End of Session - 03/20/23 1150     Visit Number 6    Number of Visits 12    Date for PT Re-Evaluation 04/14/23    Authorization Type BCBS, Medicare & [employer]    Progress Note Due on Visit 10    PT Start Time 1105    PT Stop Time 1158    PT Time Calculation (min) 53 min    Activity Tolerance Patient tolerated treatment well    Behavior During Therapy WFL for tasks assessed/performed                Past Medical History:  Diagnosis Date   Anemia    Chronic kidney disease    GERD (gastroesophageal reflux disease)    HTN (hypertension)    Hyperlipidemia    Hypothyroidism    Osteoarthritis    Pneumonia    Pre-diabetes    Sleep apnea    no CPAP   Wears glasses    Past Surgical History:  Procedure Laterality Date   COLONOSCOPY     EYE SURGERY  2006   both cataracts   MICROLARYNGOSCOPY Left 11/18/2013   Procedure: MICROLARYNGOSCOPY WITH REMOVAL OF GRANULOMA LEFT SIDE;  Surgeon: Serena Colonel, MD;  Location: [facility];  Service: ENT;  Laterality: Left;   NM MYOVIEW LTD  06/2007   Low risk, normal.  No evidence of ischemia or infarction   THYROIDECTOMY  1974   TONSILLECTOMY     as a child   TOTAL KNEE ARTHROPLASTY  2009   left   TOTAL KNEE ARTHROPLASTY Right 02/27/2023   Procedure: TOTAL KNEE ARTHROPLASTY;  Surgeon: Joen Laura, MD;  Location: [facility];  Service: Orthopedics;  Laterality: Right;   TOTAL KNEE REVISION Left 07/06/2022   Procedure: TOTAL KNEE REVISION;  Surgeon: Joen Laura, MD;  Location: [facility];  Service: Orthopedics;  Laterality: Left;   Patient Active Problem List   Diagnosis Date Noted   PAC (premature atrial contraction) 12/27/2021   Encounter for general adult medical examination with abnormal findings 06/22/2021   Bradycardia 11/30/2020   Graves'  orbitopathy 07/13/2020   Type II diabetes mellitus with manifestations 05/13/2020   Graves disease 10/07/2019   Postablative hypothyroidism 10/07/2019   Thyroid-related proptosis 08/15/2019   Chronic renal disease, stage 3, moderately decreased glomerular filtration rate (GFR) between 30-59 mL/min/1.73 square meter 08/15/2019   Long-term current use of opiate analgesic 05/14/2018   Bilateral epiphora 11/16/2017   Thiamine deficiency 06/08/2017   Obesity (BMI 30.0-34.9) 06/04/2017   Hypertensive heart disease without CHF (congestive heart failure) 12/25/2016   Right cervical radiculopathy 06/16/2016   Iron deficiency anemia 03/08/2016   Spinal stenosis in cervical region 08/25/2015   Allergic eczema 08/07/2014   Obstructive sleep apnea 11/12/2013   Mild intermittent asthma 06/03/2013   Herniated lumbar disc without myelopathy 10/18/2012   Allergic rhinitis 03/10/2011   Cardiomegaly 10/18/2010   ERECTILE DYSFUNCTION, NON-ORGANIC, MILD 06/29/2009   Hyperlipidemia associated with type 2 diabetes mellitus 05/28/2009   Essential hypertension, benign 05/28/2009   GERD 05/28/2009   BPH associated with nocturia 05/28/2009   Primary osteoarthritis of right knee 05/28/2009    PCP: [name], MD  REFERRING PROVIDER: Joen Laura, MD   REFERRING DIAG: s/p R TKA  THERAPY DIAG:  Acute pain of right knee  Stiffness  of right knee, not elsewhere classified  Localized edema  Muscle weakness (generalized)  Difficulty in walking, not elsewhere classified  Rationale for Evaluation and Treatment: Rehabilitation  ONSET DATE: 02/27/23  SUBJECTIVE:   SUBJECTIVE STATEMENT: Ronald Bond feels like he is doing better.  PERTINENT HISTORY: T2DM, CKD, HTN, Hypothyroidism, GERD, Bradycardia, history neck and back pain.  PAIN:  Are you having pain? Yes: NPRS scale: 2/10 Pain location: R knee  Pain description: throbbing  Aggravating factors: sitting Relieving factors:  moving, ice  PRECAUTIONS: None  WEIGHT BEARING RESTRICTIONS: No  FALLS:  Has patient fallen in last 6 months? 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 Arthroplasty components appear in their expected alignment. No periprosthetic fracture is identified. Expected postoperative changes within the overlying soft tissues.  PATIENT SURVEYS:  LEFS 0/80  COGNITION: Overall cognitive status: Within functional limits for tasks assessed     SENSATION: WFL  EDEMA:  Circumferential: right 49 cm; left 47.5  cm  MUSCLE LENGTH: NT  POSTURE: No Significant postural limitations  PALPATION: Tender calf but no swelling, warmth or redness.  No bleeding under bandage.  Incision covered by hydrocolloid dressing.    LOWER EXTREMITY ROM:  Passive ROM Right eval Left eval Right 03/09/23 Right 03/13/23 Right 03/20/23 AROM  Knee flexion 75 120 86 102 95  Knee extension Lacking 15 0 10 8 5    (Blank rows = not tested)  LOWER EXTREMITY MMT:  MMT Right eval Left eval  Hip flexion 2+ 4+  Hip extension    Hip abduction 5 5  Hip adduction 5 5  Hip internal rotation    Hip external rotation    Knee flexion 3 5  Knee extension 2+ 5  Ankle dorsiflexion 4+ 5  Ankle plantarflexion    Ankle inversion    Ankle eversion     (Blank rows = not tested)  LOWER EXTREMITY SPECIAL TESTS:     FUNCTIONAL TESTS:  30 seconds chair stand test 1 rep. Gait speed 0.25m/s  GAIT: Distance walked: 50' Assistive device utilized: Environmental consultant - 2 wheeled Level of assistance: Modified independence Comments: R knee flexion throughout stance phase, decreased weight shift to R, decreased gait speed   TODAY'S TREATMENT:  DATE:  03/20/2023 Therapeutic Exercise: to improve strength and mobility.  Demo, verbal and tactile cues throughout for technique. Bike partial rev x 6 min Heel raises 2x10 with UE support Mini squats x 10  Step ups: fwd and lateral RLE x 10 each Retro steps no UE support x 20- cues to avoid leaning  Seated R HS curls RTB x 10 Seated R LAQ 2# x 10  Manual Therapy: to decrease muscle spasm, pain and improve mobility.  PROM to R knee flexion and extension  Vasopneumatic: Gameready at end of session for post session soreness/edema. 10 min, low compression, 34 deg (coldest).  03/16/2023 Therapeutic Exercise: to improve strength and mobility.  Demo, verbal and tactile cues throughout for technique. Gait x 180' with RW Seated heel slides x 15  Church pews 2x10 Retro step x 10 cues required for correct sequence of mvmt Standing marching x 5 bil at wall Standing toe raises at wall with TKE x 10 Hamstring curls on p-ball with strap 2x10  Manual Therapy: to decrease muscle spasm, pain and improve mobility.  PROM to R knee flexion and extension  Vasopneumatic: Gameready at end of session for post session soreness/edema. 10 min, low compression, 34 deg (coldest).  03/13/2023 Therapeutic Exercise: to improve strength and mobility.  Demo, verbal and tactile cues throughout for technique. Nustep L5 x 6 min Gait x 180' with RW Seated LAQ R 2 x 5  STS x 5  Seated quad sets 5 x 5 sec hold Retro step x 3 - very challenging Church pews 2 x 5  Hamstring curls  on p-ball with strap 2 x 5 SLR x 5 - fatigued quickly SAQ 2 x 10 minA at heel  Quad sets with ankle on bolster for TKE x 10  Vasopneumatic: Gameready at end of session for post session soreness/edema. 10 min, low compression, 34 deg (coldest).   03/09/23 Therapeutic Exercise: to improve strength and mobility.  Demo, verbal and tactile cues throughout for technique. Nustep L5 x 6 min  Seated LAQ R x 10 Seated R Heel slides x 10 AROM; x 10 AAROM with strap Supine R quad sets with towel under knee x 20 Supine SLR with QS x 5 - quad lag after 5 reps  Manual Therapy: to decrease muscle spasm, pain and improve mobility.  Gentle PROM to R knee flexion and extension  Cold pack to R knee x 10 min   03/06/23 Therapeutic Exercise: to improve strength and mobility.  Demo, verbal and tactile cues throughout for technique. Nustep L5 x 6 min  Seated LAQ 2 x 10 RLE Seated quad sets 10 x 5 sec hold RLE Seated heel slides x 10 RLE Supine quad sets 10 x 5 sec hold RLE towel under knee with tractile cues Supine hamstring curls with feet on p-ball and strap to assist x 10 Vasopneumatic: Gameready at end of session for post session soreness/edema. 10 min, low compression, 34 deg (coldest).   03/03/23 Therapeutic Exercise: to improve strength and mobility.  Demo, verbal and tactile cues throughout for technique. Review of supine exercises for HEP Quad sets with towel roll under knee - tactile cues needed 10 x 5 sec hold Ankle pumps Heel slides with strap - modA needed Hip abduction x 10 - minA SLR x 5 - modA needed Vasopneumatic: Gameready at end of session for post session soreness/edema. 10 min, low compression, 34 deg (coldest).     PATIENT EDUCATION:  Education details: HEP review and update Person educated:  Patient Education method: Explanation, Demonstration, Verbal cues, and Handouts Education comprehension: verbalized understanding and returned demonstration  HOME EXERCISE PROGRAM: Access  Code: [email or web address] URL: [email or web address] Date: 03/13/2023 Prepared by: Harrie Foreman  Exercises - Supine Ankle Pumps  - 12 x daily - 7 x weekly - 10 reps - Supine Heel Slide  - 3 x daily - 7 x weekly - 2 sets - 10 reps - Supine Quad Set  - 3 x daily - 7 x weekly - 2 sets - 10 reps - 5s hold - Supine Active Straight Leg Raise  - 3 x daily - 7 x weekly - 2 sets - 10 reps - Supine Hip Abduction  - 31 x daily - 7 x weekly - 2 sets - 10 reps - Seated Knee Flexion AAROM  - 3 x daily - 7 x weekly - 2 sets - 10 reps - 5s hold - Seated Long Arc Quad  - 3 x daily - 7 x weekly - 2 sets - 10 reps - 5s hold - Seated Quad Set  - 3 x daily - 7 x weekly - 2 sets - 10 reps - 5 sec  hold - Seated Knee Flexion Stretch  - 3 x daily - 7 x weekly - 1 sets - 10 reps - 5 sec  hold - Supine Short Arc Quad  - 1 x daily - 7 x weekly - 2 sets - 10 reps - Church Pew  - 1 x daily - 7 x weekly - 2 sets - 10 reps - Backward Weight Shift and Opposite Arm Raise with Walker  - 1 x daily - 7 x weekly - 2 sets - 10 reps  ASSESSMENT:  CLINICAL IMPRESSION: Mr. Bruso showed a better tolerance for exercises today. He has a hard time using quads with step ups, requiring supervision to ensure proper activation. Able to progress strengthening with no issues. ROM has improved (5-95) active for R knee. Vaso post session for edema and pain.   OBJECTIVE IMPAIRMENTS: Abnormal gait, decreased activity tolerance, decreased balance, decreased endurance, decreased mobility, difficulty walking, decreased ROM, decreased strength, increased edema, increased fascial restrictions, increased muscle spasms, and pain.   ACTIVITY LIMITATIONS: carrying, lifting, bending, sitting, standing, squatting, sleeping, stairs, transfers, bed mobility, and locomotion level  PARTICIPATION LIMITATIONS: meal prep, cleaning, laundry, driving, shopping, community activity, and occupation  PERSONAL FACTORS: Age, Past/current experiences, and  3+ comorbidities: T2DM, CKD, HTN, Hypothyroidism, GERD, Bradycardia, history neck and back pain  are also affecting patient's functional outcome.   REHAB POTENTIAL: Good  CLINICAL DECISION MAKING: Evolving/moderate complexity  EVALUATION COMPLEXITY: Moderate   GOALS: Goals reviewed with patient? Yes   SHORT TERM GOALS: Target date: 03/17/2023    Independent with initial HEP. Baseline: reviewed Goal status: IN PROGRESS 03/06/23- reviewed 03/13/23- partial compliance.    LONG TERM GOALS: Target date: 04/14/2023   Independent with advanced/ongoing HEP to improve outcomes and carryover.  Baseline:  Goal status: IN PROGRESS  2.  Ronald Bond will demonstrate R knee flexion to 120 deg to ascend/descend stairs. Baseline: 75 Goal status: IN PROGRESS 03/06/23- 84 03/13/23- 102  3.  Ronald Bond will demonstrate full R knee extension for safety with gait. Baseline: lacking 15 Goal status: IN PROGRESS 03/13/23- lacking 8  4.  Ronald Bond will be able to ambulate 600' safely with LRAD and normal gait pattern to access community.  Baseline: slow antalgic with RW Goal status: IN PROGRESS 03/13/23- improved heel strike and step length, RW  5.  Ronald Bond will be able to ascend/descend stairs with 1 HR and reciprocal step pattern safely to access home and community.  Baseline: unable Goal status: IN PROGRESS  6.  Ronald Bond will demonstrate > 19/24 on DGI to demonstrate decreased risk of falls.   Baseline: NT Goal status: IN PROGRESS  7.  Ronald Bond will demonstrate improved functional LE strength by completing 5x STS in <20 seconds.   Baseline: 25 sec to complete 1 sit to stand Goal status: IN PROGRESS  8.  Ronald Bond will demonstrate 30/80 on LEFS to demonstrate improved mobility.  Baseline: 0/80 Goal status: IN PROGRESS  PLAN:  PT FREQUENCY: 2x/week  PT DURATION: 6 weeks  PLANNED INTERVENTIONS: Therapeutic exercises, Therapeutic activity,  Neuromuscular re-education, Balance training, Gait training, Patient/Family education, Self Care, Joint mobilization, Stair training, Orthotic/Fit training, Electrical stimulation, Cryotherapy, Moist heat, Taping, Vasopneumatic device, Manual therapy, and Re-evaluation  PLAN FOR NEXT SESSION: progress exercises, ROM and gait training as tolerated.  Manual therapy and modalities PRN.    Darleene Cleaver, PTA 03/20/2023, 12:02 PM

## 2023-03-23 ENCOUNTER — Ambulatory Visit: Payer: BC Managed Care – PPO

## 2023-03-23 DIAGNOSIS — M25561 Pain in right knee: Secondary | ICD-10-CM

## 2023-03-23 DIAGNOSIS — M25661 Stiffness of right knee, not elsewhere classified: Secondary | ICD-10-CM

## 2023-03-23 DIAGNOSIS — M6281 Muscle weakness (generalized): Secondary | ICD-10-CM

## 2023-03-23 DIAGNOSIS — R600 Localized edema: Secondary | ICD-10-CM

## 2023-03-23 DIAGNOSIS — R262 Difficulty in walking, not elsewhere classified: Secondary | ICD-10-CM

## 2023-03-23 NOTE — Therapy (Signed)
OUTPATIENT PHYSICAL THERAPY TREATMENT      Patient Name: Ronald Bond MRN: [id number] DOB:[birth year], 77 y.o., male Today's Date: 03/23/2023  END OF SESSION:  PT End of Session - 03/23/23 1108     Visit Number 7    Number of Visits 12    Date for PT Re-Evaluation 04/14/23    Authorization Type BCBS, Medicare & [employer]    Progress Note Due on Visit 10    PT Start Time 1104    PT Stop Time 1147    PT Time Calculation (min) 43 min    Activity Tolerance Patient tolerated treatment well    Behavior During Therapy WFL for tasks assessed/performed                 Past Medical History:  Diagnosis Date   Anemia    Chronic kidney disease    GERD (gastroesophageal reflux disease)    HTN (hypertension)    Hyperlipidemia    Hypothyroidism    Osteoarthritis    Pneumonia    Pre-diabetes    Sleep apnea    no CPAP   Wears glasses    Past Surgical History:  Procedure Laterality Date   COLONOSCOPY     EYE SURGERY  2006   both cataracts   MICROLARYNGOSCOPY Left 11/18/2013   Procedure: MICROLARYNGOSCOPY WITH REMOVAL OF GRANULOMA LEFT SIDE;  Surgeon: Serena Colonel, MD;  Location: [facility];  Service: ENT;  Laterality: Left;   NM MYOVIEW LTD  06/2007   Low risk, normal.  No evidence of ischemia or infarction   THYROIDECTOMY  1974   TONSILLECTOMY     as a child   TOTAL KNEE ARTHROPLASTY  2009   left   TOTAL KNEE ARTHROPLASTY Right 02/27/2023   Procedure: TOTAL KNEE ARTHROPLASTY;  Surgeon: Joen Laura, MD;  Location: [facility];  Service: Orthopedics;  Laterality: Right;   TOTAL KNEE REVISION Left 07/06/2022   Procedure: TOTAL KNEE REVISION;  Surgeon: Joen Laura, MD;  Location: [facility];  Service: Orthopedics;  Laterality: Left;   Patient Active Problem List   Diagnosis Date Noted   PAC (premature atrial contraction) 12/27/2021   Encounter for general adult medical examination with abnormal findings 06/22/2021   Bradycardia 11/30/2020   Graves'  orbitopathy 07/13/2020   Type II diabetes mellitus with manifestations 05/13/2020   Graves disease 10/07/2019   Postablative hypothyroidism 10/07/2019   Thyroid-related proptosis 08/15/2019   Chronic renal disease, stage 3, moderately decreased glomerular filtration rate (GFR) between 30-59 mL/min/1.73 square meter 08/15/2019   Long-term current use of opiate analgesic 05/14/2018   Bilateral epiphora 11/16/2017   Thiamine deficiency 06/08/2017   Obesity (BMI 30.0-34.9) 06/04/2017   Hypertensive heart disease without CHF (congestive heart failure) 12/25/2016   Right cervical radiculopathy 06/16/2016   Iron deficiency anemia 03/08/2016   Spinal stenosis in cervical region 08/25/2015   Allergic eczema 08/07/2014   Obstructive sleep apnea 11/12/2013   Mild intermittent asthma 06/03/2013   Herniated lumbar disc without myelopathy 10/18/2012   Allergic rhinitis 03/10/2011   Cardiomegaly 10/18/2010   ERECTILE DYSFUNCTION, NON-ORGANIC, MILD 06/29/2009   Hyperlipidemia associated with type 2 diabetes mellitus 05/28/2009   Essential hypertension, benign 05/28/2009   GERD 05/28/2009   BPH associated with nocturia 05/28/2009   Primary osteoarthritis of right knee 05/28/2009    PCP: [name], MD  REFERRING PROVIDER: Joen Laura, MD   REFERRING DIAG: s/p R TKA  THERAPY DIAG:  Acute pain of right knee  Stiffness of right knee, not elsewhere classified  Localized edema  Muscle weakness (generalized)  Difficulty in walking, not elsewhere classified  Rationale for Evaluation and Treatment: Rehabilitation  ONSET DATE: 02/27/23  SUBJECTIVE:   SUBJECTIVE STATEMENT: [name] denies any pain today.  PERTINENT HISTORY: T2DM, CKD, HTN, Hypothyroidism, GERD, Bradycardia, history neck and back pain.  PAIN:  Are you having pain? No  PRECAUTIONS: None  WEIGHT BEARING RESTRICTIONS: No  FALLS:  Has patient fallen in last 6 months? 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 Arthroplasty components appear in their expected alignment. No periprosthetic fracture is identified. Expected postoperative changes within the overlying soft tissues.  PATIENT SURVEYS:  LEFS 0/80  COGNITION: Overall cognitive status: Within functional limits for tasks assessed     SENSATION: WFL  EDEMA:  Circumferential: right 49 cm; left 47.5  cm  MUSCLE LENGTH: NT  POSTURE: No Significant postural limitations  PALPATION: Tender calf but no swelling, warmth or redness.  No bleeding under bandage.  Incision covered by hydrocolloid dressing.    LOWER EXTREMITY ROM:  Passive ROM Right eval Left eval Right 03/09/23 Right 03/13/23 Right 4/22/2 AROM Right 03/23/23 AROM  Knee flexion 75 120 86 102 95 101  Knee extension Lacking 15 0 10 8 5 5    (Blank rows = not tested)  LOWER EXTREMITY MMT:  MMT Right eval Left eval  Hip flexion 2+ 4+  Hip extension    Hip abduction 5 5  Hip adduction 5 5  Hip internal rotation    Hip external rotation    Knee flexion 3 5  Knee extension 2+ 5  Ankle dorsiflexion 4+ 5  Ankle plantarflexion    Ankle inversion    Ankle eversion     (Blank rows = not tested)  LOWER EXTREMITY SPECIAL TESTS:    FUNCTIONAL TESTS:  30 seconds chair stand test 1 rep. Gait speed 0.67m/s  GAIT: Distance  walked: 50' Assistive device utilized: Environmental consultant - 2 wheeled Level of assistance: Modified independence Comments: R knee flexion throughout stance phase, decreased weight shift to R, decreased gait speed   TODAY'S TREATMENT:                                                                                                                              DATE:  03/23/2023 Therapeutic Exercise: to improve  strength and mobility.  Demo, verbal and tactile cues throughout for technique. Bike partial rev for 2 min then progressed to full rev for x 4 min seat 10 Standing heel raise 2x15 Step ups R LE x 10 4' Lateral step ups RLE x 10 4' Knee flexion 20# 2x10 BLE; 10# RLE 2x10 Knee extension 10# B con/R ecc x 10   PROM R knee flexion and extension Tibia on femur mobs PA/AP for ROM grade III-IV  Gait Training: With SPC 180 ft  03/20/2023 Therapeutic Exercise: to improve strength and mobility.  Demo, verbal and tactile cues throughout for technique. Bike partial rev x 6 min Heel raises 2x10 with UE support Mini squats x 10  Step ups: fwd and lateral RLE x 10 each Retro steps no UE support x 20- cues to avoid leaning  Seated R HS curls RTB x 10 Seated R LAQ 2# x 10  Manual Therapy: to decrease muscle spasm, pain and improve mobility.  PROM to R knee flexion and extension  Vasopneumatic: Gameready at end of session for post session soreness/edema. 10 min, low compression, 34 deg (coldest).  03/16/2023 Therapeutic Exercise: to improve strength and mobility.  Demo, verbal and tactile cues throughout for technique. Gait x 180' with RW Seated heel slides x 15  Church pews 2x10 Retro step x 10 cues required for correct sequence of mvmt Standing marching x 5 bil at wall Standing toe raises at wall with TKE x 10 Hamstring curls on p-ball with strap 2x10  Manual Therapy: to decrease muscle spasm, pain and improve mobility.  PROM to R knee flexion and extension  Vasopneumatic: Gameready at end of  session for post session soreness/edema. 10 min, low compression, 34 deg (coldest).  03/13/2023 Therapeutic Exercise: to improve strength and mobility.  Demo, verbal and tactile cues throughout for technique. Nustep L5 x 6 min Gait x 180' with RW Seated LAQ R 2 x 5  STS x 5  Seated quad sets 5 x 5 sec hold Retro step x 3 - very challenging Church pews 2 x 5  Hamstring curls on p-ball with strap 2 x 5 SLR x 5 - fatigued quickly SAQ 2 x 10 minA at heel  Quad sets with ankle on bolster for TKE x 10  Vasopneumatic: Gameready at end of session for post session soreness/edema. 10 min, low compression, 34 deg (coldest).   03/09/23 Therapeutic Exercise: to improve strength and mobility.  Demo, verbal and tactile cues throughout for technique. Nustep L5 x 6 min  Seated LAQ R x 10 Seated R Heel slides x 10 AROM; x 10 AAROM with strap Supine R quad sets with towel under knee x 20 Supine SLR with QS x 5 - quad lag after 5 reps  Manual Therapy: to decrease muscle spasm, pain and improve mobility.  Gentle PROM to R knee flexion and extension  Cold pack to R knee x 10 min   03/06/23 Therapeutic Exercise: to improve strength and mobility.  Demo, verbal and tactile cues throughout for technique. Nustep L5 x 6 min  Seated LAQ 2 x 10 RLE Seated quad sets 10 x 5 sec hold RLE Seated heel slides x 10 RLE Supine quad sets 10 x 5 sec hold RLE towel under knee with tractile cues Supine hamstring curls with feet on p-ball and strap to assist x 10 Vasopneumatic: Gameready at end of session for post session soreness/edema. 10 min, low compression, 34 deg (coldest).   03/03/23 Therapeutic Exercise: to improve strength  and mobility.  Demo, verbal and tactile cues throughout for technique. Review of supine exercises for HEP Quad sets with towel roll under knee - tactile cues needed 10 x 5 sec hold Ankle pumps Heel slides with strap - modA needed Hip abduction x 10 - minA SLR x 5 - modA needed Vasopneumatic:  Gameready at end of session for post session soreness/edema. 10 min, low compression, 34 deg (coldest).     PATIENT EDUCATION:  Education details: HEP review and update Person educated: Patient Education method: Explanation, Demonstration, Verbal cues, and Handouts Education comprehension: verbalized understanding and returned demonstration  HOME EXERCISE PROGRAM: Access Code: [email or web address] URL: [email or web address] Date: 03/13/2023 Prepared by: Harrie Foreman  Exercises - Supine Ankle Pumps  - 12 x daily - 7 x weekly - 10 reps - Supine Heel Slide  - 3 x daily - 7 x weekly - 2 sets - 10 reps - Supine Quad Set  - 3 x daily - 7 x weekly - 2 sets - 10 reps - 5s hold - Supine Active Straight Leg Raise  - 3 x daily - 7 x weekly - 2 sets - 10 reps - Supine Hip Abduction  - 31 x daily - 7 x weekly - 2 sets - 10 reps - Seated Knee Flexion AAROM  - 3 x daily - 7 x weekly - 2 sets - 10 reps - 5s hold - Seated Long Arc Quad  - 3 x daily - 7 x weekly - 2 sets - 10 reps - 5s hold - Seated Quad Set  - 3 x daily - 7 x weekly - 2 sets - 10 reps - 5 sec  hold - Seated Knee Flexion Stretch  - 3 x daily - 7 x weekly - 1 sets - 10 reps - 5 sec  hold - Supine Short Arc Quad  - 1 x daily - 7 x weekly - 2 sets - 10 reps - Church Pew  - 1 x daily - 7 x weekly - 2 sets - 10 reps - Backward Weight Shift and Opposite Arm Raise with Walker  - 1 x daily - 7 x weekly - 2 sets - 10 reps  ASSESSMENT:  CLINICAL IMPRESSION: Mr. Weekes showed improvement being able to achieve full revolutions on bike today. He also demonstrated a good heel-toe pattern with SPC, he can start using at home but use walker out in public. He showed a better quad contraction with step ups today. After manual he showed improved ROM (4-101 deg). He declined GR today.   OBJECTIVE IMPAIRMENTS: Abnormal gait, decreased activity tolerance, decreased balance, decreased endurance, decreased mobility, difficulty walking, decreased  ROM, decreased strength, increased edema, increased fascial restrictions, increased muscle spasms, and pain.   ACTIVITY LIMITATIONS: carrying, lifting, bending, sitting, standing, squatting, sleeping, stairs, transfers, bed mobility, and locomotion level  PARTICIPATION LIMITATIONS: meal prep, cleaning, laundry, driving, shopping, community activity, and occupation  PERSONAL FACTORS: Age, Past/current experiences, and 3+ comorbidities: T2DM, CKD, HTN, Hypothyroidism, GERD, Bradycardia, history neck and back pain  are also affecting patient's functional outcome.   REHAB POTENTIAL: Good  CLINICAL DECISION MAKING: Evolving/moderate complexity  EVALUATION COMPLEXITY: Moderate   GOALS: Goals reviewed with patient? Yes   SHORT TERM GOALS: Target date: 03/17/2023    Independent with initial HEP. Baseline: reviewed Goal status: IN PROGRESS 03/06/23- reviewed 03/13/23- partial compliance.    LONG TERM GOALS: Target date: 04/14/2023   Independent with advanced/ongoing HEP to improve outcomes and carryover.  Baseline:  Goal status:  IN PROGRESS  2.  [name] will demonstrate R knee flexion to 120 deg to ascend/descend stairs. Baseline: 75 Goal status: IN PROGRESS 03/06/23- 84 03/13/23- 102  3.  [name] will demonstrate full R knee extension for safety with gait. Baseline: lacking 15 Goal status: IN PROGRESS 03/13/23- lacking 8  4.  [name] will be able to ambulate 600' safely with LRAD and normal gait pattern to access community.  Baseline: slow antalgic with RW Goal status: IN PROGRESS 03/13/23- improved heel strike and step length, RW  5.  [name] will be able to ascend/descend stairs with 1 HR and reciprocal step pattern safely to access home and community.  Baseline: unable Goal status: IN PROGRESS  6.  DONTRELL STUCK will demonstrate > 19/24 on DGI to demonstrate decreased risk of falls.   Baseline: NT Goal status: IN PROGRESS  7.  [name] will demonstrate improved functional LE strength by completing 5x STS in <20 seconds.   Baseline: 25 sec to complete 1 sit to stand Goal status: IN PROGRESS  8.  [name] will demonstrate 30/80 on LEFS to demonstrate improved mobility.  Baseline: 0/80 Goal status: IN PROGRESS  PLAN:  PT FREQUENCY: 2x/week  PT DURATION: 6 weeks  PLANNED INTERVENTIONS: Therapeutic exercises, Therapeutic activity, Neuromuscular re-education, Balance training, Gait training, Patient/Family education, Self Care, Joint mobilization, Stair training, Orthotic/Fit training, Electrical stimulation, Cryotherapy, Moist heat, Taping, Vasopneumatic device, Manual therapy, and Re-evaluation  PLAN FOR NEXT SESSION: progress exercises, ROM and gait training as tolerated.  Manual therapy and modalities PRN.    Darleene Cleaver, PTA 03/23/2023, 12:04 PM

## 2023-03-24 ENCOUNTER — Other Ambulatory Visit: Payer: Self-pay | Admitting: Internal Medicine

## 2023-03-24 DIAGNOSIS — I10 Essential (primary) hypertension: Secondary | ICD-10-CM

## 2023-03-24 DIAGNOSIS — I119 Hypertensive heart disease without heart failure: Secondary | ICD-10-CM

## 2023-03-27 ENCOUNTER — Encounter: Payer: Self-pay | Admitting: Internal Medicine

## 2023-03-27 ENCOUNTER — Ambulatory Visit: Payer: BC Managed Care – PPO

## 2023-03-27 DIAGNOSIS — M25561 Pain in right knee: Secondary | ICD-10-CM | POA: Diagnosis not present

## 2023-03-27 DIAGNOSIS — R600 Localized edema: Secondary | ICD-10-CM

## 2023-03-27 DIAGNOSIS — M25661 Stiffness of right knee, not elsewhere classified: Secondary | ICD-10-CM

## 2023-03-27 DIAGNOSIS — R262 Difficulty in walking, not elsewhere classified: Secondary | ICD-10-CM

## 2023-03-27 DIAGNOSIS — M6281 Muscle weakness (generalized): Secondary | ICD-10-CM

## 2023-03-27 NOTE — Therapy (Signed)
OUTPATIENT PHYSICAL THERAPY TREATMENT      Patient Name: Ronald Bond MRN: [id number] DOB:[birth year], 77 y.o., male Today's Date: 03/27/2023  END OF SESSION:  PT End of Session - 03/27/23 1132     Visit Number 8    Number of Visits 12    Date for PT Re-Evaluation 04/14/23    Authorization Type BCBS, Medicare & [employer]    Progress Note Due on Visit 10    PT Start Time 1105    PT Stop Time 1147    PT Time Calculation (min) 42 min    Activity Tolerance Patient tolerated treatment well    Behavior During Therapy WFL for tasks assessed/performed                  Past Medical History:  Diagnosis Date   Anemia    Chronic kidney disease    GERD (gastroesophageal reflux disease)    HTN (hypertension)    Hyperlipidemia    Hypothyroidism    Osteoarthritis    Pneumonia    Pre-diabetes    Sleep apnea    no CPAP   Wears glasses    Past Surgical History:  Procedure Laterality Date   COLONOSCOPY     EYE SURGERY  2006   both cataracts   MICROLARYNGOSCOPY Left 11/18/2013   Procedure: MICROLARYNGOSCOPY WITH REMOVAL OF GRANULOMA LEFT SIDE;  Surgeon: Serena Colonel, MD;  Location: [facility];  Service: ENT;  Laterality: Left;   NM MYOVIEW LTD  06/2007   Low risk, normal.  No evidence of ischemia or infarction   THYROIDECTOMY  1974   TONSILLECTOMY     as a child   TOTAL KNEE ARTHROPLASTY  2009   left   TOTAL KNEE ARTHROPLASTY Right 02/27/2023   Procedure: TOTAL KNEE ARTHROPLASTY;  Surgeon: Joen Laura, MD;  Location: [facility];  Service: Orthopedics;  Laterality: Right;   TOTAL KNEE REVISION Left 07/06/2022   Procedure: TOTAL KNEE REVISION;  Surgeon: Joen Laura, MD;  Location: [facility];  Service: Orthopedics;  Laterality: Left;   Patient Active Problem List   Diagnosis Date Noted   PAC (premature atrial contraction) 12/27/2021   Encounter for general adult medical examination with abnormal findings 06/22/2021   Bradycardia 11/30/2020    Graves' orbitopathy 07/13/2020   Type II diabetes mellitus with manifestations (HCC) 05/13/2020   Graves disease 10/07/2019   Postablative hypothyroidism 10/07/2019   Thyroid-related proptosis 08/15/2019   Chronic renal disease, stage 3, moderately decreased glomerular filtration rate (GFR) between 30-59 mL/min/1.73 square meter (HCC) 08/15/2019   Long-term current use of opiate analgesic 05/14/2018   Bilateral epiphora 11/16/2017   Thiamine deficiency 06/08/2017   Obesity (BMI 30.0-34.9) 06/04/2017   Hypertensive heart disease without CHF (congestive heart failure) 12/25/2016   Right cervical radiculopathy 06/16/2016   Iron deficiency anemia 03/08/2016   Spinal stenosis in cervical region 08/25/2015   Allergic eczema 08/07/2014   Obstructive sleep apnea 11/12/2013   Mild intermittent asthma 06/03/2013   Herniated lumbar disc without myelopathy 10/18/2012   Allergic rhinitis 03/10/2011   Cardiomegaly 10/18/2010   ERECTILE DYSFUNCTION, NON-ORGANIC, MILD 06/29/2009   Hyperlipidemia associated with type 2 diabetes mellitus (HCC) 05/28/2009   Essential hypertension, benign 05/28/2009   GERD 05/28/2009   BPH associated with nocturia 05/28/2009   Primary osteoarthritis of right knee 05/28/2009    PCP: [name], MD  REFERRING PROVIDER: Joen Laura, MD   REFERRING DIAG: s/p R TKA  THERAPY DIAG:  Acute pain  of right knee  Stiffness of right knee, not elsewhere classified  Localized edema  Muscle weakness (generalized)  Difficulty in walking, not elsewhere classified  Rationale for Evaluation and Treatment: Rehabilitation  ONSET DATE: 02/27/23  SUBJECTIVE:   SUBJECTIVE STATEMENT: Pt reports a little pain today.  PERTINENT HISTORY: T2DM, CKD, HTN, Hypothyroidism, GERD, Bradycardia, history neck and back pain.  PAIN:  Are you having pain? Yes: NPRS scale: 3/10 Pain location: R knee Pain description: ache  PRECAUTIONS: None  WEIGHT BEARING RESTRICTIONS:  No  FALLS:  Has patient fallen in last 6 months? 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 Arthroplasty components appear in their expected alignment. No periprosthetic fracture is identified. Expected postoperative changes within the overlying soft tissues.  PATIENT SURVEYS:  LEFS 0/80  COGNITION: Overall cognitive status: Within functional limits for tasks assessed     SENSATION: WFL  EDEMA:  Circumferential: right 49 cm; left 47.5  cm  MUSCLE LENGTH: NT  POSTURE: No Significant postural limitations  PALPATION: Tender calf but no swelling, warmth or redness.  No bleeding under bandage.  Incision covered by hydrocolloid dressing.    LOWER EXTREMITY ROM:  Passive ROM Right eval Left eval Right 03/09/23 Right 03/13/23 Right 4/22/2 AROM Right 03/23/23 AROM  Knee flexion 75 120 86 102 95 101  Knee extension Lacking 15 0 10 8 5 5    (Blank rows = not tested)  LOWER EXTREMITY MMT:  MMT Right eval Left eval  Hip flexion 2+ 4+  Hip extension    Hip abduction 5 5  Hip adduction 5 5  Hip internal rotation    Hip external rotation    Knee flexion 3 5  Knee extension 2+ 5  Ankle dorsiflexion 4+ 5  Ankle plantarflexion    Ankle inversion    Ankle eversion     (Blank rows = not tested)  LOWER EXTREMITY SPECIAL TESTS:    FUNCTIONAL TESTS:  30 seconds  chair stand test 1 rep. Gait speed 0.78m/s  GAIT: Distance walked: 50' Assistive device utilized: Environmental consultant - 2 wheeled Level of assistance: Modified independence Comments: R knee flexion throughout stance phase, decreased weight shift to R, decreased gait speed   TODAY'S TREATMENT:  DATE:  03/27/2023 Therapeutic Exercise: to improve strength and mobility.  Demo, verbal and tactile cues throughout for technique. Bike L1x35min Star excursion 1/2 circle with LLE 6x Fwd step ups 6' 2x10 R Squats 2x10 with UE support - increased depth  Knee flexion 5# B con/R ecc 2x10 Knee extension 5# B con/R ecc 2x10  PROM R knee flexion and extension Tibia on femur mobs PA/AP for ROM grade III-IV  03/23/2023 Therapeutic Exercise: to improve strength and mobility.  Demo, verbal and tactile cues throughout for technique. Bike partial rev for 2 min then progressed to full rev for x 4 min seat 10 Standing heel raise 2x15 Step ups R LE x 10 4' Lateral step ups RLE x 10 4' Knee flexion 20# 2x10 BLE; 10# RLE 2x10 Knee extension 10# B con/R ecc x 10   PROM R knee flexion and extension Tibia on femur mobs PA/AP for ROM grade III-IV  Gait Training: With SPC 180 ft  03/20/2023 Therapeutic Exercise: to improve strength and mobility.  Demo, verbal and tactile cues throughout for technique. Bike partial rev x 6 min Heel raises 2x10 with UE support Mini squats x 10  Step ups: fwd and lateral RLE x 10 each Retro steps no UE support x 20- cues to avoid leaning  Seated R HS curls RTB x 10 Seated R LAQ 2# x 10  Manual Therapy: to decrease muscle spasm, pain and improve mobility.  PROM to R knee flexion and extension  Vasopneumatic: Gameready at end of session for post session soreness/edema. 10 min, low compression, 34 deg (coldest).  03/16/2023 Therapeutic Exercise: to improve strength  and mobility.  Demo, verbal and tactile cues throughout for technique. Gait x 180' with RW Seated heel slides x 15  Church pews 2x10 Retro step x 10 cues required for correct sequence of mvmt Standing marching x 5 bil at wall Standing toe raises at wall with TKE x 10 Hamstring curls on p-ball with strap 2x10  Manual Therapy: to decrease muscle spasm, pain and improve mobility.  PROM to R knee flexion and extension  Vasopneumatic: Gameready at end of session for post session soreness/edema. 10 min, low compression, 34 deg (coldest).  03/13/2023 Therapeutic Exercise: to improve strength and mobility.  Demo, verbal and tactile cues throughout for technique. Nustep L5 x 6 min Gait x 180' with RW Seated LAQ R 2 x 5  STS x 5  Seated quad sets 5 x 5 sec hold Retro step x 3 - very challenging Church pews 2 x 5  Hamstring curls on p-ball with strap 2 x 5 SLR x 5 - fatigued quickly SAQ 2 x 10 minA at heel  Quad sets with ankle on bolster for TKE x 10  Vasopneumatic: Gameready at end of session for post session soreness/edema. 10 min, low compression, 34 deg (coldest).   03/09/23 Therapeutic Exercise: to improve strength and mobility.  Demo, verbal and tactile cues throughout for technique. Nustep L5 x 6 min  Seated LAQ R x 10 Seated R Heel slides x 10 AROM; x 10 AAROM with strap Supine R quad sets with towel under knee x 20 Supine SLR with QS x 5 - quad lag after 5 reps  Manual Therapy: to decrease muscle spasm, pain and improve mobility.  Gentle PROM to R knee flexion and extension  Cold pack to R knee x 10 min   03/06/23 Therapeutic Exercise: to improve strength and mobility.  Demo, verbal and tactile cues throughout for technique. Nustep  L5 x 6 min  Seated LAQ 2 x 10 RLE Seated quad sets 10 x 5 sec hold RLE Seated heel slides x 10 RLE Supine quad sets 10 x 5 sec hold RLE towel under knee with tractile cues Supine hamstring curls with feet on p-ball and strap to assist x  10 Vasopneumatic: Gameready at end of session for post session soreness/edema. 10 min, low compression, 34 deg (coldest).   03/03/23 Therapeutic Exercise: to improve strength and mobility.  Demo, verbal and tactile cues throughout for technique. Review of supine exercises for HEP Quad sets with towel roll under knee - tactile cues needed 10 x 5 sec hold Ankle pumps Heel slides with strap - modA needed Hip abduction x 10 - minA SLR x 5 - modA needed Vasopneumatic: Gameready at end of session for post session soreness/edema. 10 min, low compression, 34 deg (coldest).     PATIENT EDUCATION:  Education details: HEP review and update Person educated: Patient Education method: Explanation, Demonstration, Verbal cues, and Handouts Education comprehension: verbalized understanding and returned demonstration  HOME EXERCISE PROGRAM: Access Code: [email or web address] URL: [email or web address] Date: 03/27/2023 Prepared by: Verta Ellen  Exercises - Supine Heel Slide  - 3 x daily - 7 x weekly - 2 sets - 10 reps - Supine Quad Set  - 3 x daily - 7 x weekly - 2 sets - 10 reps - 5s hold - Supine Active Straight Leg Raise  - 3 x daily - 7 x weekly - 2 sets - 10 reps - Seated Knee Flexion AAROM  - 3 x daily - 7 x weekly - 2 sets - 10 reps - 5s hold - Seated Long Arc Quad  - 3 x daily - 7 x weekly - 2 sets - 10 reps - 5s hold - Seated Knee Flexion Stretch  - 3 x daily - 7 x weekly - 1 sets - 10 reps - 5 sec  hold - Church Pew  - 1 x daily - 7 x weekly - 2 sets - 10 reps - Backward Weight Shift and Opposite Arm Raise with Walker  - 1 x daily - 7 x weekly - 2 sets - 10 reps - Gastroc Stretch on Wall  - 1 x daily - 7 x weekly - 3 sets - 30 sec hold  ASSESSMENT:  CLINICAL IMPRESSION: Pt w/o any complaints during interventions. He shows good gait pattern with cane but with decreased heel strike. He showed difficulty with ecc lowering of R LE on leg curl/ext machine. He continues to show good  tolerance of exercises and progressing well.    OBJECTIVE IMPAIRMENTS: Abnormal gait, decreased activity tolerance, decreased balance, decreased endurance, decreased mobility, difficulty walking, decreased ROM, decreased strength, increased edema, increased fascial restrictions, increased muscle spasms, and pain.   ACTIVITY LIMITATIONS: carrying, lifting, bending, sitting, standing, squatting, sleeping, stairs, transfers, bed mobility, and locomotion level  PARTICIPATION LIMITATIONS: meal prep, cleaning, laundry, driving, shopping, community activity, and occupation  PERSONAL FACTORS: Age, Past/current experiences, and 3+ comorbidities: T2DM, CKD, HTN, Hypothyroidism, GERD, Bradycardia, history neck and back pain  are also affecting patient's functional outcome.   REHAB POTENTIAL: Good  CLINICAL DECISION MAKING: Evolving/moderate complexity  EVALUATION COMPLEXITY: Moderate   GOALS: Goals reviewed with patient? Yes   SHORT TERM GOALS: Target date: 03/17/2023    Independent with initial HEP. Baseline: reviewed Goal status: IN PROGRESS 03/06/23- reviewed 03/13/23- partial compliance.    LONG TERM GOALS: Target date: 04/14/2023   Independent with advanced/ongoing HEP to improve  outcomes and carryover.  Baseline:  Goal status: IN PROGRESS  2.  Ronald Bond will demonstrate R knee flexion to 120 deg to ascend/descend stairs. Baseline: 75 Goal status: IN PROGRESS 03/06/23- 84 03/13/23- 102  3.  Ronald Bond will demonstrate full R knee extension for safety with gait. Baseline: lacking 15 Goal status: IN PROGRESS 03/13/23- lacking 8  4.  Ronald Bond will be able to ambulate 600' safely with LRAD and normal gait pattern to access community.  Baseline: slow antalgic with RW Goal status: IN PROGRESS 03/13/23- improved heel strike and step length, RW  5.  Ronald Bond will be able to ascend/descend stairs with 1 HR and reciprocal step pattern safely to access home and  community.  Baseline: unable Goal status: IN PROGRESS  6.  Ronald Bond will demonstrate > 19/24 on DGI to demonstrate decreased risk of falls.   Baseline: NT Goal status: IN PROGRESS  7.  Ronald Bond will demonstrate improved functional LE strength by completing 5x STS in <20 seconds.   Baseline: 25 sec to complete 1 sit to stand Goal status: IN PROGRESS  8.  Ronald Bond will demonstrate 30/80 on LEFS to demonstrate improved mobility.  Baseline: 0/80 Goal status: IN PROGRESS  PLAN:  PT FREQUENCY: 2x/week  PT DURATION: 6 weeks  PLANNED INTERVENTIONS: Therapeutic exercises, Therapeutic activity, Neuromuscular re-education, Balance training, Gait training, Patient/Family education, Self Care, Joint mobilization, Stair training, Orthotic/Fit training, Electrical stimulation, Cryotherapy, Moist heat, Taping, Vasopneumatic device, Manual therapy, and Re-evaluation  PLAN FOR NEXT SESSION: progress exercises, ROM and gait training as tolerated.  Manual therapy and modalities PRN.    Darleene Cleaver, PTA 03/27/2023, 11:58 AM

## 2023-03-28 ENCOUNTER — Other Ambulatory Visit: Payer: Self-pay | Admitting: Internal Medicine

## 2023-03-28 DIAGNOSIS — I119 Hypertensive heart disease without heart failure: Secondary | ICD-10-CM

## 2023-03-28 MED ORDER — INDAPAMIDE 1.25 MG PO TABS
1.2500 mg | ORAL_TABLET | Freq: Every day | ORAL | 3 refills | Status: DC
Start: 2023-03-28 — End: 2023-03-30

## 2023-03-30 ENCOUNTER — Encounter: Payer: Self-pay | Admitting: Internal Medicine

## 2023-03-30 ENCOUNTER — Ambulatory Visit: Payer: BC Managed Care – PPO | Attending: Orthopedic Surgery | Admitting: Physical Therapy

## 2023-03-30 ENCOUNTER — Ambulatory Visit (INDEPENDENT_AMBULATORY_CARE_PROVIDER_SITE_OTHER): Payer: BC Managed Care – PPO | Admitting: Internal Medicine

## 2023-03-30 ENCOUNTER — Encounter: Payer: Self-pay | Admitting: Physical Therapy

## 2023-03-30 VITALS — BP 120/58 | HR 59 | Temp 98.0°F | Resp 16 | Ht 69.0 in | Wt 201.0 lb

## 2023-03-30 DIAGNOSIS — D508 Other iron deficiency anemias: Secondary | ICD-10-CM

## 2023-03-30 DIAGNOSIS — E519 Thiamine deficiency, unspecified: Secondary | ICD-10-CM | POA: Diagnosis not present

## 2023-03-30 DIAGNOSIS — N1832 Chronic kidney disease, stage 3b: Secondary | ICD-10-CM | POA: Diagnosis not present

## 2023-03-30 DIAGNOSIS — R262 Difficulty in walking, not elsewhere classified: Secondary | ICD-10-CM

## 2023-03-30 DIAGNOSIS — M25661 Stiffness of right knee, not elsewhere classified: Secondary | ICD-10-CM | POA: Insufficient documentation

## 2023-03-30 DIAGNOSIS — N183 Chronic kidney disease, stage 3 unspecified: Secondary | ICD-10-CM

## 2023-03-30 DIAGNOSIS — E118 Type 2 diabetes mellitus with unspecified complications: Secondary | ICD-10-CM | POA: Diagnosis not present

## 2023-03-30 DIAGNOSIS — M6281 Muscle weakness (generalized): Secondary | ICD-10-CM

## 2023-03-30 DIAGNOSIS — R600 Localized edema: Secondary | ICD-10-CM | POA: Insufficient documentation

## 2023-03-30 DIAGNOSIS — I10 Essential (primary) hypertension: Secondary | ICD-10-CM

## 2023-03-30 DIAGNOSIS — E1122 Type 2 diabetes mellitus with diabetic chronic kidney disease: Secondary | ICD-10-CM | POA: Diagnosis not present

## 2023-03-30 DIAGNOSIS — M25561 Pain in right knee: Secondary | ICD-10-CM

## 2023-03-30 DIAGNOSIS — E890 Postprocedural hypothyroidism: Secondary | ICD-10-CM | POA: Diagnosis not present

## 2023-03-30 DIAGNOSIS — Z7984 Long term (current) use of oral hypoglycemic drugs: Secondary | ICD-10-CM

## 2023-03-30 DIAGNOSIS — R001 Bradycardia, unspecified: Secondary | ICD-10-CM

## 2023-03-30 LAB — CBC WITH DIFFERENTIAL/PLATELET
Basophils Absolute: 0 K/uL (ref 0.0–0.1)
Basophils Relative: 0.5 % (ref 0.0–3.0)
Eosinophils Absolute: 0.3 K/uL (ref 0.0–0.7)
Eosinophils Relative: 4.6 % (ref 0.0–5.0)
HCT: 31.9 % — ABNORMAL LOW (ref 39.0–52.0)
Hemoglobin: 10.7 g/dL — ABNORMAL LOW (ref 13.0–17.0)
Lymphocytes Relative: 14.9 % (ref 12.0–46.0)
Lymphs Abs: 1 K/uL (ref 0.7–4.0)
MCHC: 33.5 g/dL (ref 30.0–36.0)
MCV: 87.8 fl (ref 78.0–100.0)
Monocytes Absolute: 0.8 K/uL (ref 0.1–1.0)
Monocytes Relative: 12 % (ref 3.0–12.0)
Neutro Abs: 4.4 K/uL (ref 1.4–7.7)
Neutrophils Relative %: 68 % (ref 43.0–77.0)
Platelets: 268 K/uL (ref 150.0–400.0)
RBC: 3.63 Mil/uL — ABNORMAL LOW (ref 4.22–5.81)
RDW: 15.7 % — ABNORMAL HIGH (ref 11.5–15.5)
WBC: 6.5 K/uL (ref 4.0–10.5)

## 2023-03-30 LAB — BASIC METABOLIC PANEL WITH GFR
BUN: 30 mg/dL — ABNORMAL HIGH (ref 6–23)
CO2: 25 meq/L (ref 19–32)
Calcium: 9.1 mg/dL (ref 8.4–10.5)
Chloride: 101 meq/L (ref 96–112)
Creatinine, Ser: 2.12 mg/dL — ABNORMAL HIGH (ref 0.40–1.50)
GFR: 29.5 mL/min — ABNORMAL LOW
Glucose, Bld: 103 mg/dL — ABNORMAL HIGH (ref 70–99)
Potassium: 3.7 meq/L (ref 3.5–5.1)
Sodium: 136 meq/L (ref 135–145)

## 2023-03-30 LAB — URINALYSIS, ROUTINE W REFLEX MICROSCOPIC
Bilirubin Urine: NEGATIVE
Hgb urine dipstick: NEGATIVE
Ketones, ur: NEGATIVE
Leukocytes,Ua: NEGATIVE
Nitrite: NEGATIVE
Specific Gravity, Urine: 1.01 (ref 1.000–1.030)
Total Protein, Urine: NEGATIVE
Urine Glucose: >=1000 — AB
Urobilinogen, UA: 0.2 (ref 0.0–1.0)
pH: 6 (ref 5.0–8.0)

## 2023-03-30 LAB — IBC + FERRITIN
Ferritin: 97.7 ng/mL (ref 22.0–322.0)
Iron: 43 ug/dL (ref 42–165)
Saturation Ratios: 13.3 % — ABNORMAL LOW (ref 20.0–50.0)
TIBC: 323.4 ug/dL (ref 250.0–450.0)
Transferrin: 231 mg/dL (ref 212.0–360.0)

## 2023-03-30 LAB — TSH: TSH: 5.93 u[IU]/mL — ABNORMAL HIGH (ref 0.35–5.50)

## 2023-03-30 LAB — HEMOGLOBIN A1C: Hgb A1c MFr Bld: 6.2 % (ref 4.6–6.5)

## 2023-03-30 MED ORDER — KERENDIA 20 MG PO TABS
1.0000 | ORAL_TABLET | Freq: Every day | ORAL | 0 refills | Status: DC
Start: 2023-03-30 — End: 2023-05-19

## 2023-03-30 MED ORDER — ACCRUFER 30 MG PO CAPS
1.0000 | ORAL_CAPSULE | Freq: Two times a day (BID) | ORAL | 0 refills | Status: DC
Start: 2023-03-30 — End: 2023-04-06

## 2023-03-30 NOTE — Therapy (Signed)
OUTPATIENT PHYSICAL THERAPY TREATMENT      Patient Name: Ronald Bond MRN: [id number] DOB:1946-02-26, 77 y.o., male Today's Date: 03/30/2023  END OF SESSION:  PT End of Session - 03/30/23 1104     Visit Number 9    Number of Visits 12    Date for PT Re-Evaluation 04/14/23    Authorization Type BCBS, Medicare & Devoted    Progress Note Due on Visit 10    PT Start Time 1102    PT Stop Time 1146    PT Time Calculation (min) 44 min    Activity Tolerance Patient tolerated treatment well    Behavior During Therapy WFL for tasks assessed/performed                  Past Medical History:  Diagnosis Date   Anemia    Chronic kidney disease    GERD (gastroesophageal reflux disease)    HTN (hypertension)    Hyperlipidemia    Hypothyroidism    Osteoarthritis    Pneumonia    Pre-diabetes    Sleep apnea    no CPAP   Wears glasses    Past Surgical History:  Procedure Laterality Date   COLONOSCOPY     EYE SURGERY  2006   both cataracts   MICROLARYNGOSCOPY Left 11/18/2013   Procedure: MICROLARYNGOSCOPY WITH REMOVAL OF GRANULOMA LEFT SIDE;  Surgeon: Serena Colonel, MD;  Location: [facility];  Service: ENT;  Laterality: Left;   NM MYOVIEW LTD  06/2007   Low risk, normal.  No evidence of ischemia or infarction   THYROIDECTOMY  1974   TONSILLECTOMY     as a child   TOTAL KNEE ARTHROPLASTY  2009   left   TOTAL KNEE ARTHROPLASTY Right 02/27/2023   Procedure: TOTAL KNEE ARTHROPLASTY;  Surgeon: Joen Laura, MD;  Location: [facility];  Service: Orthopedics;  Laterality: Right;   TOTAL KNEE REVISION Left 07/06/2022   Procedure: TOTAL KNEE REVISION;  Surgeon: Joen Laura, MD;  Location: [facility];  Service: Orthopedics;  Laterality: Left;   Patient Active Problem List   Diagnosis Date Noted   PAC (premature atrial contraction) 12/27/2021   Encounter for general adult medical examination with abnormal findings 06/22/2021   Bradycardia 11/30/2020    Graves' orbitopathy 07/13/2020   Type II diabetes mellitus with manifestations (HCC) 05/13/2020   Graves disease 10/07/2019   Postablative hypothyroidism 10/07/2019   Thyroid-related proptosis 08/15/2019   Chronic renal disease, stage 3, moderately decreased glomerular filtration rate (GFR) between 30-59 mL/min/1.73 square meter (HCC) 08/15/2019   Long-term current use of opiate analgesic 05/14/2018   Bilateral epiphora 11/16/2017   Thiamine deficiency 06/08/2017   Obesity (BMI 30.0-34.9) 06/04/2017   Hypertensive heart disease without CHF (congestive heart failure) 12/25/2016   Right cervical radiculopathy 06/16/2016   Iron deficiency anemia 03/08/2016   Spinal stenosis in cervical region 08/25/2015   Allergic eczema 08/07/2014   Obstructive sleep apnea 11/12/2013   Mild intermittent asthma 06/03/2013   Herniated lumbar disc without myelopathy 10/18/2012   Allergic rhinitis 03/10/2011   Cardiomegaly 10/18/2010   ERECTILE DYSFUNCTION, NON-ORGANIC, MILD 06/29/2009   Hyperlipidemia associated with type 2 diabetes mellitus (HCC) 05/28/2009   Essential hypertension, benign 05/28/2009   GERD 05/28/2009   BPH associated with nocturia 05/28/2009   Primary osteoarthritis of right knee 05/28/2009    PCP: [name], MD  REFERRING PROVIDER: Joen Laura, MD   REFERRING DIAG: s/p R TKA  THERAPY DIAG:  Acute pain  of right knee  Stiffness of right knee, not elsewhere classified  Localized edema  Muscle weakness (generalized)  Difficulty in walking, not elsewhere classified  Rationale for Evaluation and Treatment: Rehabilitation  ONSET DATE: 02/27/23  SUBJECTIVE:   SUBJECTIVE STATEMENT: Pt reports a little pain today, but its getting better.  PERTINENT HISTORY: T2DM, CKD, HTN, Hypothyroidism, GERD, Bradycardia, history neck and back pain.  PAIN:  Are you having pain? Yes: NPRS scale: 2/10 Pain location: R knee Pain description: ache  PRECAUTIONS:  None  WEIGHT BEARING RESTRICTIONS: No  FALLS:  Has patient fallen in last 6 months? 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 Arthroplasty components appear in their expected alignment. No periprosthetic fracture is identified. Expected postoperative changes within the overlying soft tissues.  PATIENT SURVEYS:  LEFS 0/80  COGNITION: Overall cognitive status: Within functional limits for tasks assessed     SENSATION: WFL  EDEMA:  Circumferential: right 49 cm; left 47.5  cm  MUSCLE LENGTH: NT  POSTURE: No Significant postural limitations  PALPATION: Tender calf but no swelling, warmth or redness.  No bleeding under bandage.  Incision covered by hydrocolloid dressing.    LOWER EXTREMITY ROM:  Passive ROM Right eval Left eval Right 03/09/23 Right 03/13/23 Right 4/22/2 AROM Right 03/23/23 AROM Right 03/30/23 PROM  Knee flexion 75 120 86 102 95 101 Pre: 110, Post 115  Knee extension Lacking 15 0 10 8 5 5  Pre: 5,  Post: 4   (Blank rows = not tested)  LOWER EXTREMITY MMT:  MMT Right eval Left eval Right 03/30/23  Hip flexion 2+ 4+ 4+  Hip extension   4+  Hip abduction 5 5 5   Hip adduction 5 5 5   Knee flexion 3 5 4+  Knee extension 2+ 5 4+  Ankle dorsiflexion 4+ 5 5  Ankle plantarflexion      (Blank rows = not tested)  LOWER EXTREMITY SPECIAL TESTS:     FUNCTIONAL TESTS:  30 seconds chair stand test 1 rep. Gait speed 0.86m/s  GAIT: Distance walked: 50' Assistive device utilized: Environmental consultant - 2 wheeled Level of assistance: Modified independence Comments: R knee flexion throughout stance phase, decreased weight shift to R, decreased gait speed   TODAY'S TREATMENT:  DATE:  03/30/23 Therapeutic Exercise: to improve strength and mobility.  Demo, verbal and tactile cues throughout for technique. Bike L1 x 6 min (seat 9, no rocking needs to start) Knee flexion 10# B con/R ecc 2x10 Knee extension 10# B con/R ecc 2x10 Stairs x 14 with 1 HR and reciprocal step Dips on bottom step for eccentric strengthening.  5x STS Manual Therapy: to decrease muscle spasm and pain and improve mobility IASTM to R quads, Tibia on femur mobs PA/AP for ROM grade III-IV   03/27/2023 Therapeutic Exercise: to improve strength and mobility.  Demo, verbal and tactile cues throughout for technique. Bike L1x62min Star excursion 1/2 circle with LLE 6x Fwd step ups 6' 2x10 R Squats 2x10 with UE support - increased depth  Knee flexion 5# B con/R ecc 2x10 Knee extension 5# B con/R ecc 2x10  PROM R knee flexion and extension Tibia on femur mobs PA/AP for ROM grade III-IV  03/23/2023 Therapeutic Exercise: to improve strength and mobility.  Demo, verbal and tactile cues throughout for technique. Bike partial rev for 2 min then progressed to full rev for x 4 min seat 10 Standing heel raise 2x15 Step ups R LE x 10 4' Lateral step ups RLE x 10 4' Knee flexion 20# 2x10 BLE; 10# RLE 2x10 Knee extension 10# B con/R ecc x 10   PROM R knee flexion and extension Tibia on femur mobs PA/AP for ROM grade III-IV  Gait Training: With SPC 180 ft  03/20/2023 Therapeutic Exercise: to improve strength and mobility.  Demo, verbal and tactile cues throughout for  technique. Bike partial rev x 6 min Heel raises 2x10 with UE support Mini squats x 10  Step ups: fwd and lateral RLE x 10 each Retro steps no UE support x 20- cues to avoid leaning  Seated R HS curls RTB x 10 Seated R LAQ 2# x 10  Manual Therapy: to decrease muscle spasm, pain and improve mobility.  PROM to R knee flexion and extension  Vasopneumatic: Gameready at end of session for post session soreness/edema. 10 min, low compression, 34 deg (coldest).  PATIENT EDUCATION:  Education details: HEP review and update Person educated: Patient Education method: Explanation, Demonstration, Verbal cues, and Handouts Education comprehension: verbalized understanding and returned demonstration  HOME EXERCISE PROGRAM: Access Code: [email or web address] URL: [email or web address] Date: 03/27/2023 Prepared by: Verta Ellen  Exercises - Supine Heel Slide  - 3 x daily - 7 x weekly - 2 sets - 10 reps - Supine Quad Set  - 3 x daily - 7 x weekly - 2 sets - 10 reps - 5s hold - Supine Active Straight Leg Raise  - 3 x daily - 7 x weekly - 2 sets - 10 reps - Seated Knee Flexion AAROM  - 3 x daily - 7 x weekly - 2 sets - 10 reps - 5s hold - Seated Long Arc Quad  - 3 x daily - 7 x weekly - 2 sets - 10 reps - 5s hold - Seated Knee Flexion Stretch  - 3 x daily - 7 x weekly - 1 sets - 10 reps - 5 sec  hold - [place]  - 1 x daily - 7 x weekly - 2 sets - 10 reps - Backward Weight Shift and Opposite Arm Raise with Walker  - 1 x daily - 7 x weekly - 2 sets - 10 reps - Gastroc Stretch on Wall  - 1 x daily - 7 x weekly - 3 sets -  30 sec hold  ASSESSMENT:  CLINICAL IMPRESSION: Ronald Bond is making good progress, able to ascend/descend stairs with reciprocal step but decreased eccentric control on R with descent.  He will work on dips at home on bottom step to improve.  His ROM is also improving, measuring 5-110 today and 4-115 after manual therapy.  Declined modalities at end of session.  [name] continues to demonstrate potential for improvement and would benefit from continued skilled therapy to address impairments.      OBJECTIVE IMPAIRMENTS: Abnormal gait, decreased activity tolerance, decreased balance, decreased endurance, decreased mobility, difficulty walking, decreased ROM, decreased strength, increased edema, increased fascial restrictions, increased muscle spasms, and pain.   ACTIVITY LIMITATIONS: carrying, lifting, bending, sitting, standing, squatting, sleeping, stairs, transfers, bed mobility, and locomotion level  PARTICIPATION LIMITATIONS: meal prep, cleaning, laundry, driving, shopping, community activity, and occupation  PERSONAL FACTORS: Age, Past/current experiences, and 3+ comorbidities: T2DM, CKD, HTN, Hypothyroidism, GERD, Bradycardia, history neck and back pain  are also affecting patient's functional outcome.   REHAB POTENTIAL: Good  CLINICAL DECISION MAKING: Evolving/moderate complexity  EVALUATION COMPLEXITY: Moderate   GOALS: Goals reviewed with patient? Yes   SHORT TERM GOALS: Target date: 03/17/2023    Independent with initial HEP. Baseline: reviewed Goal status: IN PROGRESS 03/06/23- reviewed 03/13/23- partial compliance.    LONG TERM GOALS: Target date: 04/14/2023   Independent with advanced/ongoing HEP to improve outcomes and carryover.  Baseline:  Goal status: IN PROGRESS  2.  [name] will demonstrate R knee flexion to 120 deg to ascend/descend stairs. Baseline: 75 Goal status: IN PROGRESS 03/06/23- 84 03/13/23- 102  3.  [name] will demonstrate full R knee extension for safety with gait. Baseline: lacking 15 Goal status: IN PROGRESS 03/13/23- lacking 8  4.  [name] will be able to ambulate 600' safely with LRAD and normal gait pattern to access community.  Baseline: slow antalgic with RW Goal status: IN PROGRESS 03/13/23- improved heel strike and step length, RW.  03/30/23- using SPC, and short distances  without AD.   5.  [name] will be able to ascend/descend stairs with 1 HR and reciprocal step pattern safely to access home and community.  Baseline: unable Goal status: IN PROGRESS 03/30/23- able to ascend/descend with reciprocal step and HR, but poor eccentric control on R descending.   6.  ALVAR MALINOSKI will demonstrate > 19/24 on DGI to demonstrate decreased risk of falls.   Baseline: NT Goal status: IN PROGRESS  7.  [name] will demonstrate improved functional LE strength by completing 5x STS in <20 seconds.   Baseline: 25 sec to complete 1 sit to stand Goal status: IN PROGRESS 03/30/23- 24 seconds to complete 5x STS without UE assist.   8.  [name] will demonstrate 30/80 on LEFS to demonstrate improved mobility.  Baseline: 0/80 Goal status: IN PROGRESS  PLAN:  PT FREQUENCY: 2x/week  PT DURATION: 6 weeks  PLANNED INTERVENTIONS: Therapeutic exercises, Therapeutic activity, Neuromuscular re-education, Balance training, Gait training, Patient/Family education, Self Care, Joint mobilization, Stair training, Orthotic/Fit training, Electrical stimulation, Cryotherapy, Moist heat, Taping, Vasopneumatic device, Manual therapy, and Re-evaluation  PLAN FOR NEXT SESSION: progress exercises, ROM and gait training as tolerated.  Manual therapy and modalities PRN.    Jena Gauss, PT, DPT  03/30/2023, 11:56 AM

## 2023-03-30 NOTE — Progress Notes (Signed)
Subjective:  Patient ID: Ronald Bond, male    DOB: 1946/06/15  Age: 77 y.o. MRN: [id number]  CC: Anemia and Diabetes   HPI Ronald Bond presents for f/up ----  He complains of a several week history of dizziness, lightheadedness, and fatigue.  He denies chest pain, diaphoresis, palpitations, or edema.  Outpatient Medications Prior to Visit  Medication Sig Dispense Refill   acetaminophen (TYLENOL) 500 MG tablet Take 2 tablets (1,000 mg total) by mouth every 8 (eight) hours as needed. 90 tablet 0   albuterol (VENTOLIN HFA) 108 (90 Base) MCG/ACT inhaler Inhale 2 puffs into the lungs every 6 (six) hours as needed for wheezing or shortness of breath. 18 g 3   apixaban (ELIQUIS) 2.5 MG TABS tablet Take 1 tablet (2.5 mg total) by mouth 2 (two) times daily. 60 tablet 0   atorvastatin (LIPITOR) 40 MG tablet TAKE 1 TABLET BY MOUTH EVERY DAY 90 tablet 1   Blood Pressure Monitoring (BLOOD PRESSURE MONITOR/ARM) DEVI 1 Device by Does not apply route daily. USE DAILY TO  TAKE BLOOD PRESSURE READINGS 1 each 0   cetirizine (ZYRTEC) 10 MG tablet Take 10 mg by mouth daily.     cholecalciferol (VITAMIN D3) 25 MCG (1000 UNIT) tablet Take 1,000 Units by mouth daily.     docusate sodium (COLACE) 100 MG capsule Take 100 mg by mouth 2 (two) times daily.     FARXIGA 10 MG TABS tablet TAKE 1 TABLET BY MOUTH EVERY DAY BEFORE BREAKFAST 90 tablet 1   fluticasone (FLONASE) 50 MCG/ACT nasal spray Place 2 sprays into both nostrils daily. 48 g 1   hydrALAZINE (APRESOLINE) 25 MG tablet TAKE 1 TABLET BY MOUTH THREE TIMES A DAY 270 tablet 0   levothyroxine (SYNTHROID) 112 MCG tablet Take 1 tablet (112 mcg total) by mouth daily. 90 tablet 3   metoprolol tartrate (LOPRESSOR) 25 MG tablet TAKE 0.5 TABLETS BY MOUTH 2 TIMES DAILY. 90 tablet 1   omeprazole (PRILOSEC) 40 MG capsule Take 1 capsule (40 mg total) by mouth daily. 90 capsule 1   Polyethyl Glycol-Propyl Glycol 0.4-0.3 % SOLN Place 1 drop into both eyes as needed  (dry eyes).     tamsulosin (FLOMAX) 0.4 MG CAPS capsule Take 1 capsule (0.4 mg total) by mouth daily after breakfast. 90 capsule 1   TURMERIC CURCUMIN PO Take 1 tablet by mouth daily.     valsartan (DIOVAN) 320 MG tablet TAKE 1 TABLET BY MOUTH EVERY DAY 90 tablet 0   amLODipine (NORVASC) 10 MG tablet TAKE 1 TABLET BY MOUTH EVERY DAY 90 tablet 1   Finerenone (KERENDIA) 10 MG TABS Take 1 tablet (10 mg total) by mouth daily. 30 tablet 5   indapamide (LOZOL) 1.25 MG tablet Take 1 tablet (1.25 mg total) by mouth daily. Follow-up appt due in July 30 tablet 3   Multiple Vitamin (MULTIVITAMIN) tablet Take 1 tablet by mouth daily.     Multiple Vitamins-Minerals (ZINC PO) Take 1 tablet by mouth daily.     vitamin C (ASCORBIC ACID) 500 MG tablet Take 500 mg by mouth daily.     No facility-administered medications prior to visit.    ROS Review of Systems  Constitutional: Negative.  Negative for diaphoresis and fatigue.  HENT: Negative.    Eyes: Negative.   Respiratory: Negative.  Negative for cough, chest tightness, shortness of breath and wheezing.   Cardiovascular:  Negative for chest pain, palpitations and leg swelling.  Gastrointestinal:  Negative for abdominal pain,  diarrhea, nausea and vomiting.  Endocrine: Negative.   Genitourinary: Negative.  Negative for difficulty urinating.  Musculoskeletal: Negative.   Skin: Negative.   Neurological:  Positive for dizziness and light-headedness. Negative for syncope, weakness and numbness.  Hematological:  Negative for adenopathy. Does not bruise/bleed easily.  Psychiatric/Behavioral: Negative.      Objective:  BP (!) 120/58 (BP Location: Right Arm, Patient Position: Sitting, Cuff Size: Large)   Pulse (!) 59   Temp 98 F (36.7 C) (Oral)   Resp 16   Ht 5\' 9"  (1.753 m)   Wt 201 lb (91.2 kg)   SpO2 95%   BMI 29.68 kg/m   BP Readings from Last 3 Encounters:  03/30/23 (!) 120/58  03/01/23 (!) 168/76  02/16/23 122/60    Wt Readings from  Last 3 Encounters:  03/30/23 201 lb (91.2 kg)  02/27/23 207 lb 3.7 oz (94 kg)  02/16/23 209 lb (94.8 kg)    Physical Exam Vitals reviewed.  HENT:     Mouth/Throat:     Mouth: Mucous membranes are moist.  Eyes:     General: No scleral icterus.    Conjunctiva/sclera: Conjunctivae normal.  Cardiovascular:     Rate and Rhythm: Regular rhythm. Bradycardia present.     Heart sounds: Normal heart sounds, S1 normal and S2 normal. No murmur heard.    No friction rub. No gallop.     Comments: EKG- SB, 54 bpm No LVH, Q waves, or ST/T wave changes Pulmonary:     Effort: Pulmonary effort is normal.     Breath sounds: No stridor. No wheezing, rhonchi or rales.  Abdominal:     General: Abdomen is flat.     Palpations: There is no mass.     Tenderness: There is no abdominal tenderness. There is no guarding.     Hernia: No hernia is present.  Musculoskeletal:     Cervical back: Neck supple.     Right lower leg: No edema.     Left lower leg: No edema.  Lymphadenopathy:     Cervical: No cervical adenopathy.  Skin:    General: Skin is warm and dry.  Neurological:     General: No focal deficit present.     Mental Status: He is alert.  Psychiatric:        Mood and Affect: Mood normal.        Behavior: Behavior normal.     Lab Results  Component Value Date   WBC 6.5 03/30/2023   HGB 10.7 (L) 03/30/2023   HCT 31.9 (L) 03/30/2023   PLT 268.0 03/30/2023   GLUCOSE 103 (H) 03/30/2023   CHOL 159 06/06/2022   TRIG 90.0 06/06/2022   HDL 56.60 06/06/2022   LDLDIRECT 75.0 03/08/2016   LDLCALC 84 06/06/2022   ALT 13 02/16/2023   AST 15 02/16/2023   NA 136 03/30/2023   K 3.7 03/30/2023   CL 101 03/30/2023   CREATININE 2.12 (H) 03/30/2023   BUN 30 (H) 03/30/2023   CO2 25 03/30/2023   TSH 5.93 (H) 03/30/2023   PSA 3.58 12/28/2022   INR 0.9 10/17/2007   HGBA1C 6.2 03/30/2023   MICROALBUR 4.6 (H) 12/28/2022    DG Chest 1 View  Result Date: 02/28/2023 CLINICAL DATA:  Chest pain  EXAM: CHEST  1 VIEW COMPARISON:  CT 09/28/2016.  X-ray 02/04/2019 FINDINGS: No consolidation, pneumothorax or effusion. No edema. Normal cardiopericardial silhouette. Degenerative changes of the spine. Slightly elevated right hemidiaphragm IMPRESSION: No acute cardiopulmonary disease Electronically Signed  By: Karen Kays M.D.   On: 02/28/2023 14:29   DG Knee Right Port  Result Date: 02/27/2023 CLINICAL DATA:  Postop right knee EXAM: PORTABLE RIGHT KNEE - 2 VIEW COMPARISON:  Right knee radiograph dated December 06, 2013 FINDINGS: Interval postsurgical changes from right total knee arthroplasty. Arthroplasty components appear in their expected alignment. No periprosthetic fracture is identified. Expected postoperative changes within the overlying soft tissues. IMPRESSION: Status post right total knee arthroplasty. Electronically Signed   By: Allegra Lai M.D.   On: 02/27/2023 15:14    Assessment & Plan:  Bradycardia- Will discontinue amlodipine. -     EKG 12-Lead -     TSH; Future  Stage 3b chronic kidney disease (HCC) -     Urinalysis, Routine w reflex microscopic; Future -     Basic metabolic panel; Future -     Chauncey Mann; Take 1 tablet (20 mg total) by mouth daily.  Dispense: 90 tablet; Refill: 0  Essential hypertension, benign- His blood pressure is over-controlled , he is symptomatic, and has had a decline in his renal function.  Will d'c indapamide and amlodipine. -     EKG 12-Lead -     Urinalysis, Routine w reflex microscopic; Future -     Basic metabolic panel; Future  Iron deficiency anemia secondary to inadequate dietary iron intake -     IBC + Ferritin; Future -     CBC with Differential/Platelet; Future -     ACCRUFeR; Take 1 capsule (30 mg total) by mouth in the morning and at bedtime.  Dispense: 180 capsule; Refill: 0  Postablative hypothyroidism- He is euthyroid. -     TSH; Future  Thiamine deficiency -     CBC with Differential/Platelet; Future -     Vitamin B1;  Future  Type II diabetes mellitus with manifestations (HCC) -     Urinalysis, Routine w reflex microscopic; Future -     Hemoglobin A1c; Future -     Basic metabolic panel; Future -     Chauncey Mann; Take 1 tablet (20 mg total) by mouth daily.  Dispense: 90 tablet; Refill: 0  CKD stage 3 due to type 2 diabetes mellitus (HCC) -     Basic metabolic panel; Future -     Chauncey Mann; Take 1 tablet (20 mg total) by mouth daily.  Dispense: 90 tablet; Refill: 0     Follow-up: Return in about 3 months (around 06/30/2023).  Sanda Linger, MD

## 2023-03-30 NOTE — Patient Instructions (Signed)

## 2023-04-01 ENCOUNTER — Encounter: Payer: Self-pay | Admitting: Internal Medicine

## 2023-04-02 LAB — VITAMIN B1: Vitamin B1 (Thiamine): 15 nmol/L (ref 8–30)

## 2023-04-03 ENCOUNTER — Ambulatory Visit: Payer: BC Managed Care – PPO | Admitting: Physical Therapy

## 2023-04-03 ENCOUNTER — Encounter: Payer: Self-pay | Admitting: Physical Therapy

## 2023-04-03 DIAGNOSIS — M25561 Pain in right knee: Secondary | ICD-10-CM | POA: Diagnosis not present

## 2023-04-03 DIAGNOSIS — R262 Difficulty in walking, not elsewhere classified: Secondary | ICD-10-CM

## 2023-04-03 DIAGNOSIS — M6281 Muscle weakness (generalized): Secondary | ICD-10-CM

## 2023-04-03 DIAGNOSIS — M25661 Stiffness of right knee, not elsewhere classified: Secondary | ICD-10-CM

## 2023-04-03 DIAGNOSIS — R600 Localized edema: Secondary | ICD-10-CM

## 2023-04-03 NOTE — Therapy (Signed)
OUTPATIENT PHYSICAL THERAPY TREATMENT Progress Note Reporting Period 03/03/23 to 04/03/23  See note below for Objective Data and Assessment of Progress/Goals.       Patient Name: Ronald Bond MRN: [id number] DOB:Aug 15, 1946, 77 y.o., male Today's Date: 04/03/2023  END OF SESSION:  PT End of Session - 04/03/23 1103     Visit Number 10    Number of Visits 16    Date for PT Re-Evaluation 05/05/23    Authorization Type BCBS, Medicare & [employer]    Progress Note Due on Visit 10    PT Start Time 1102    PT Stop Time 1151    PT Time Calculation (min) 49 min    Activity Tolerance Patient tolerated treatment well    Behavior During Therapy WFL for tasks assessed/performed                  Past Medical History:  Diagnosis Date   Anemia    Chronic kidney disease    GERD (gastroesophageal reflux disease)    HTN (hypertension)    Hyperlipidemia    Hypothyroidism    Osteoarthritis    Pneumonia    Pre-diabetes    Sleep apnea    no CPAP   Wears glasses    Past Surgical History:  Procedure Laterality Date   COLONOSCOPY     EYE SURGERY  2006   both cataracts   MICROLARYNGOSCOPY Left 11/18/2013   Procedure: MICROLARYNGOSCOPY WITH REMOVAL OF GRANULOMA LEFT SIDE;  Surgeon: Serena Colonel, MD;  Location: [facility];  Service: ENT;  Laterality: Left;   NM MYOVIEW LTD  06/2007   Low risk, normal.  No evidence of ischemia or infarction   THYROIDECTOMY  1974   TONSILLECTOMY     as a child   TOTAL KNEE ARTHROPLASTY  2009   left   TOTAL KNEE ARTHROPLASTY Right 02/27/2023   Procedure: TOTAL KNEE ARTHROPLASTY;  Surgeon: Joen Laura, MD;  Location: [facility];  Service: Orthopedics;  Laterality: Right;   TOTAL KNEE REVISION Left 07/06/2022   Procedure: TOTAL KNEE REVISION;  Surgeon: Joen Laura, MD;  Location: [facility];  Service: Orthopedics;  Laterality: Left;   Patient Active Problem List   Diagnosis Date Noted   CKD stage 3 due to type 2 diabetes mellitus  (HCC) 03/30/2023   PAC (premature atrial contraction) 12/27/2021   Encounter for general adult medical examination with abnormal findings 06/22/2021   Bradycardia 11/30/2020   Graves' orbitopathy 07/13/2020   Type II diabetes mellitus with manifestations (HCC) 05/13/2020   Graves disease 10/07/2019   Postablative hypothyroidism 10/07/2019   Thyroid-related proptosis 08/15/2019   Chronic renal disease, stage 3, moderately decreased glomerular filtration rate (GFR) between 30-59 mL/min/1.73 square meter (HCC) 08/15/2019   Long-term current use of opiate analgesic 05/14/2018   Bilateral epiphora 11/16/2017   Thiamine deficiency 06/08/2017   Obesity (BMI 30.0-34.9) 06/04/2017   Hypertensive heart disease without CHF (congestive heart failure) 12/25/2016   Right cervical radiculopathy 06/16/2016   Iron deficiency anemia 03/08/2016   Spinal stenosis in cervical region 08/25/2015   Allergic eczema 08/07/2014   Obstructive sleep apnea 11/12/2013   Mild intermittent asthma 06/03/2013   Herniated lumbar disc without myelopathy 10/18/2012   Allergic rhinitis 03/10/2011   Cardiomegaly 10/18/2010   ERECTILE DYSFUNCTION, NON-ORGANIC, MILD 06/29/2009   Hyperlipidemia associated with type 2 diabetes mellitus (HCC) 05/28/2009   Essential hypertension, benign 05/28/2009   GERD 05/28/2009   BPH associated with nocturia 05/28/2009   Primary osteoarthritis  of right knee 05/28/2009    PCP: [name], MD  REFERRING PROVIDER: Joen Laura, MD   REFERRING DIAG: s/p R TKA  THERAPY DIAG:  Acute pain of right knee  Stiffness of right knee, not elsewhere classified  Localized edema  Muscle weakness (generalized)  Difficulty in walking, not elsewhere classified  Rationale for Evaluation and Treatment: Rehabilitation  ONSET DATE: 02/27/23  SUBJECTIVE:   SUBJECTIVE STATEMENT: Came in without cane, feels he walks better without cane.  Would like to continue PT, doesn't feel he has  good strength yet in knee.   NEXT MD VISIT: 04/11/23  PERTINENT HISTORY: T2DM, CKD, HTN, Hypothyroidism, GERD, Bradycardia, history neck and back pain.  PAIN:  Are you having pain? Yes: NPRS scale: 2/10 Pain location: R knee Pain description: ache  PRECAUTIONS: None  WEIGHT BEARING RESTRICTIONS: No  FALLS:  Has patient fallen in last 6 months? 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 Arthroplasty components appear in their expected alignment. No periprosthetic fracture is identified. Expected postoperative changes within the overlying soft tissues.  PATIENT SURVEYS:  LEFS 0/80  COGNITION: Overall cognitive status: Within functional limits for tasks assessed     SENSATION: WFL  EDEMA:  Circumferential: right 49 cm; left 47.5  cm  MUSCLE LENGTH: NT  POSTURE: No Significant postural limitations  PALPATION: Tender calf but no swelling, warmth or redness.  No bleeding under bandage.  Incision covered by hydrocolloid dressing.    LOWER EXTREMITY ROM:  Passive ROM Right eval Left eval Right 03/09/23 Right 03/13/23 Right 4/22/2 AROM Right 03/23/23 AROM Right 03/30/23 PROM  Knee flexion 75 120 86 102 95 101 Pre: 110, Post 115  Knee extension Lacking 15 0 10 8 5 5  Pre: 5,  Post: 4   (Blank rows = not tested)  LOWER EXTREMITY MMT:  MMT Right eval Left eval Right 03/30/23   Hip flexion 2+ 4+ 4+  Hip extension   4+  Hip abduction 5 5 5   Hip adduction 5 5 5   Knee flexion 3 5 4+  Knee extension 2+ 5 4+  Ankle dorsiflexion 4+ 5 5  Ankle plantarflexion      (Blank rows = not tested)  LOWER EXTREMITY SPECIAL TESTS:    FUNCTIONAL TESTS:  30 seconds chair stand test 1 rep. Gait speed 0.60m/s  GAIT: Distance walked: 50' Assistive device utilized: Environmental consultant - 2 wheeled Level of assistance: Modified independence Comments: R knee flexion throughout stance phase, decreased weight shift to R, decreased gait speed   TODAY'S TREATMENT:  DATE:  04/03/23 Bike L1 x 6 min (seat 8) Knee flexion 15# B con/R ecc 2x10 Knee extension 15# B con/R ecc 2x10 Leg Press 25# 2 x 10 bil  Standing TKE with Blue TB x 20  Step downs on aerobic step Review and update of HEP.  Also added knee extension stretch with foot (Demo only today) Therapeutic Activity:  assess progress towards goals Gait x 600' DGI LEFS   03/30/23 Therapeutic Exercise: to improve strength and mobility.  Demo, verbal and tactile cues throughout for technique. Bike L1 x 6 min (seat 9, no rocking needs to start) Knee flexion 10# B con/R ecc 2x10 Knee extension 10# B con/R ecc 2x10 Stairs x 14 with 1 HR and reciprocal step Dips on bottom step for eccentric strengthening.  5x STS Manual Therapy: to decrease muscle spasm and pain and improve mobility IASTM to R quads, Tibia on femur mobs PA/AP for ROM grade III-IV   03/27/2023 Therapeutic Exercise: to improve strength and mobility.  Demo, verbal and tactile cues throughout for technique. Bike L1x37min Star excursion 1/2 circle with LLE 6x Fwd step ups 6' 2x10 R Squats 2x10 with UE support - increased depth  Knee flexion 5# B con/R ecc 2x10 Knee extension 5# B con/R ecc 2x10  PROM R knee flexion and extension Tibia on femur mobs  PA/AP for ROM grade III-IV   PATIENT EDUCATION:  Education details: HEP review and update Person educated: Patient Education method: Explanation, Demonstration, Verbal cues, and Handouts Education comprehension: verbalized understanding and returned demonstration  HOME EXERCISE PROGRAM: Access Code: [email or web address] URL: [email or web address] Date: 04/03/2023 Prepared by: Harrie Foreman  Exercises - Supine Heel Slide  - 3 x daily - 7 x weekly - 2 sets - 10 reps - Supine Quad Set  - 3 x daily - 7 x weekly - 2 sets - 10 reps - 5s hold - Supine Active Straight Leg Raise  - 3 x daily - 7 x weekly - 2 sets - 10 reps - Seated Knee Flexion AAROM  - 3 x daily - 7 x weekly - 2 sets - 10 reps - 5s hold - Seated Long Arc Quad  - 3 x daily - 7 x weekly - 2 sets - 10 reps - 5s hold - Seated Knee Flexion Stretch  - 3 x daily - 7 x weekly - 1 sets - 10 reps - 5 sec  hold - [place]  - 1 x daily - 7 x weekly - 2 sets - 10 reps - Backward Weight Shift and Opposite Arm Raise with Walker  - 1 x daily - 7 x weekly - 2 sets - 10 reps - Gastroc Stretch on Wall  - 1 x daily - 7 x weekly - 3 sets - 30 sec hold - Standing Terminal Knee Extension with Resistance  - 2 x daily - 7 x weekly - 2-3 sets - 10 reps - 5 sec  hold - Forward Step Down Touch with Heel  - 1 x daily - 7 x weekly - 2-3 sets - 10 reps - Seated Knee Extension Stretch with Chair  - 2 x daily - 7 x weekly - 1 sets - 1 reps - 3-5 min hold  ASSESSMENT:  CLINICAL IMPRESSION: NAYIB HORNBAKER is making good progress towards all goals, and met LTG #6 today, scoring 21/24 on DGI today.  However he still is lacking full AROM for both R knee flexion and extension, has significant gait deviation with gait, and  feels like his R knee will buckle.  Based on gait and lack of eccentric quad control, recommend continuing skilled therapy for 2x/week for 4 more weeks to optimize benefit from TKR and prevent falls.  [name] continues to  demonstrate potential for improvement and would benefit from continued skilled therapy to address impairments.      OBJECTIVE IMPAIRMENTS: Abnormal gait, decreased activity tolerance, decreased balance, decreased endurance, decreased mobility, difficulty walking, decreased ROM, decreased strength, increased edema, increased fascial restrictions, increased muscle spasms, and pain.   ACTIVITY LIMITATIONS: carrying, lifting, bending, sitting, standing, squatting, sleeping, stairs, transfers, bed mobility, and locomotion level  PARTICIPATION LIMITATIONS: meal prep, cleaning, laundry, driving, shopping, community activity, and occupation  PERSONAL FACTORS: Age, Past/current experiences, and 3+ comorbidities: T2DM, CKD, HTN, Hypothyroidism, GERD, Bradycardia, history neck and back pain  are also affecting patient's functional outcome.   REHAB POTENTIAL: Good  CLINICAL DECISION MAKING: Evolving/moderate complexity  EVALUATION COMPLEXITY: Moderate   GOALS: Goals reviewed with patient? Yes   SHORT TERM GOALS: Target date: 03/17/2023    Independent with initial HEP. Baseline: reviewed Goal status: MET 03/06/23- reviewed 03/13/23- partial compliance.  04/03/23- met   LONG TERM GOALS: Target date: 04/14/2023 extended to 05/05/23  Independent with advanced/ongoing HEP to improve outcomes and carryover.  Baseline:  Goal status: IN PROGRESS 04/03/23- updated HEP  2.  [name] will demonstrate R knee flexion to 120 deg to ascend/descend stairs. Baseline: 75 Goal status: IN PROGRESS 03/06/23- 84 03/13/23- 102 03/30/23- 115  3.  [name] will demonstrate full R knee extension for safety with gait. Baseline: lacking 15 Goal status: IN PROGRESS 03/13/23- lacking 8 03/30/23- lacking 4  4.  [name] will be able to ambulate 600' safely with LRAD and normal gait pattern to access community.  Baseline: slow antalgic with RW Goal status: IN PROGRESS 03/13/23- improved heel strike and step  length, RW.  03/30/23- using SPC, and short distances without AD.  04/03/23- 600' without AD, but antalgic, decreased stance time on R, trendelenberg gait.   5.  [name] will be able to ascend/descend stairs with 1 HR and reciprocal step pattern safely to access home and community.  Baseline: unable Goal status: IN PROGRESS 03/30/23- able to ascend/descend with reciprocal step and HR, but poor eccentric control on R descending.   6.  MERRIT DOMONKOS will demonstrate > 19/24 on DGI to demonstrate decreased risk of falls.   Baseline: NT Goal status: MET 04/03/23 -21/24  7.  [name] will demonstrate improved functional LE strength by completing 5x STS in <20 seconds.   Baseline: 25 sec to complete 1 sit to stand Goal status: IN PROGRESS 03/30/23- 24 seconds to complete 5x STS without UE assist.   8.  [name] will demonstrate 30/80 on LEFS to demonstrate improved mobility.  Baseline: 0/80 Goal status: IN PROGRESS 04/03/23- 28/80  PLAN:  PT FREQUENCY: 2x/week  PT DURATION: 4 weeks  PLANNED INTERVENTIONS: Therapeutic exercises, Therapeutic activity, Neuromuscular re-education, Balance training, Gait training, Patient/Family education, Self Care, Joint mobilization, Stair training, Orthotic/Fit training, Electrical stimulation, Cryotherapy, Moist heat, Taping, Vasopneumatic device, Manual therapy, and Re-evaluation  PLAN FOR NEXT SESSION: progress exercises, ROM and gait training and balance.  Manual therapy and modalities PRN.    Jena Gauss, PT, DPT  04/03/2023, 11:56 AM

## 2023-04-06 ENCOUNTER — Other Ambulatory Visit: Payer: Self-pay | Admitting: Internal Medicine

## 2023-04-06 ENCOUNTER — Ambulatory Visit: Payer: BC Managed Care – PPO

## 2023-04-06 DIAGNOSIS — M25561 Pain in right knee: Secondary | ICD-10-CM

## 2023-04-06 DIAGNOSIS — M6281 Muscle weakness (generalized): Secondary | ICD-10-CM

## 2023-04-06 DIAGNOSIS — R600 Localized edema: Secondary | ICD-10-CM

## 2023-04-06 DIAGNOSIS — D508 Other iron deficiency anemias: Secondary | ICD-10-CM

## 2023-04-06 DIAGNOSIS — R262 Difficulty in walking, not elsewhere classified: Secondary | ICD-10-CM

## 2023-04-06 DIAGNOSIS — M25661 Stiffness of right knee, not elsewhere classified: Secondary | ICD-10-CM

## 2023-04-06 MED ORDER — FERROUS SULFATE 220 (44 FE) MG/5ML PO SOLN
220.0000 mg | Freq: Every day | ORAL | 0 refills | Status: DC
Start: 2023-04-06 — End: 2023-05-19

## 2023-04-06 NOTE — Therapy (Signed)
OUTPATIENT PHYSICAL THERAPY TREATMENT       Patient Name: Ronald Bond MRN: [id number] DOB:18-Mar-1946, 77 y.o., male Today's Date: 04/06/2023  END OF SESSION:  PT End of Session - 04/06/23 1138     Visit Number 11    Number of Visits 16    Date for PT Re-Evaluation 05/05/23    Authorization Type BCBS, Medicare & Devoted    Progress Note Due on Visit 10    PT Start Time 1104    PT Stop Time 1159    PT Time Calculation (min) 55 min    Activity Tolerance Patient tolerated treatment well    Behavior During Therapy WFL for tasks assessed/performed                   Past Medical History:  Diagnosis Date   Anemia    Chronic kidney disease    GERD (gastroesophageal reflux disease)    HTN (hypertension)    Hyperlipidemia    Hypothyroidism    Osteoarthritis    Pneumonia    Pre-diabetes    Sleep apnea    no CPAP   Wears glasses    Past Surgical History:  Procedure Laterality Date   COLONOSCOPY     EYE SURGERY  2006   both cataracts   MICROLARYNGOSCOPY Left 11/18/2013   Procedure: MICROLARYNGOSCOPY WITH REMOVAL OF GRANULOMA LEFT SIDE;  Surgeon: Serena Colonel, MD;  Location: [facility];  Service: ENT;  Laterality: Left;   NM MYOVIEW LTD  06/2007   Low risk, normal.  No evidence of ischemia or infarction   THYROIDECTOMY  1974   TONSILLECTOMY     as a child   TOTAL KNEE ARTHROPLASTY  2009   left   TOTAL KNEE ARTHROPLASTY Right 02/27/2023   Procedure: TOTAL KNEE ARTHROPLASTY;  Surgeon: Joen Laura, MD;  Location: [facility];  Service: Orthopedics;  Laterality: Right;   TOTAL KNEE REVISION Left 07/06/2022   Procedure: TOTAL KNEE REVISION;  Surgeon: Joen Laura, MD;  Location: [facility];  Service: Orthopedics;  Laterality: Left;   Patient Active Problem List   Diagnosis Date Noted   CKD stage 3 due to type 2 diabetes mellitus (HCC) 03/30/2023   PAC (premature atrial contraction) 12/27/2021   Encounter for general adult medical examination  with abnormal findings 06/22/2021   Bradycardia 11/30/2020   Graves' orbitopathy 07/13/2020   Type II diabetes mellitus with manifestations (HCC) 05/13/2020   Graves disease 10/07/2019   Postablative hypothyroidism 10/07/2019   Thyroid-related proptosis 08/15/2019   Chronic renal disease, stage 3, moderately decreased glomerular filtration rate (GFR) between 30-59 mL/min/1.73 square meter (HCC) 08/15/2019   Long-term current use of opiate analgesic 05/14/2018   Bilateral epiphora 11/16/2017   Thiamine deficiency 06/08/2017   Obesity (BMI 30.0-34.9) 06/04/2017   Hypertensive heart disease without CHF (congestive heart failure) 12/25/2016   Right cervical radiculopathy 06/16/2016   Iron deficiency anemia 03/08/2016   Spinal stenosis in cervical region 08/25/2015   Allergic eczema 08/07/2014   Obstructive sleep apnea 11/12/2013   Mild intermittent asthma 06/03/2013   Herniated lumbar disc without myelopathy 10/18/2012   Allergic rhinitis 03/10/2011   Cardiomegaly 10/18/2010   ERECTILE DYSFUNCTION, NON-ORGANIC, MILD 06/29/2009   Hyperlipidemia associated with type 2 diabetes mellitus (HCC) 05/28/2009   Essential hypertension, benign 05/28/2009   GERD 05/28/2009   BPH associated with nocturia 05/28/2009   Primary osteoarthritis of right knee 05/28/2009    PCP: [name], MD  REFERRING PROVIDER: Weber Cooks  A, MD   REFERRING DIAG: s/p R TKA  THERAPY DIAG:  Acute pain of right knee  Stiffness of right knee, not elsewhere classified  Localized edema  Muscle weakness (generalized)  Difficulty in walking, not elsewhere classified  Rationale for Evaluation and Treatment: Rehabilitation  ONSET DATE: 02/27/23  SUBJECTIVE:   SUBJECTIVE STATEMENT: Slight pain today d/t weather. Walking fine w/o the cane.  NEXT MD VISIT: 04/11/23  PERTINENT HISTORY: T2DM, CKD, HTN, Hypothyroidism, GERD, Bradycardia, history neck and back pain.  PAIN:  Are you having pain? Yes:  NPRS scale: 2/10 Pain location: R knee Pain description: ache  PRECAUTIONS: None  WEIGHT BEARING RESTRICTIONS: No  FALLS:  Has patient fallen in last 6 months? 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 Arthroplasty components appear in their expected alignment. No periprosthetic fracture is identified. Expected postoperative changes within the overlying soft tissues.  PATIENT SURVEYS:  LEFS 0/80  COGNITION: Overall cognitive status: Within functional limits for tasks assessed     SENSATION: WFL  EDEMA:  Circumferential: right 49 cm; left 47.5  cm  MUSCLE LENGTH: NT  POSTURE: No Significant postural limitations  PALPATION: Tender calf but no swelling, warmth or redness.  No bleeding under bandage.  Incision covered by hydrocolloid dressing.    LOWER EXTREMITY ROM:  Passive ROM Right eval Left eval Right 03/09/23 Right 03/13/23 Right 4/22/2 AROM Right 03/23/23 AROM Right 03/30/23 PROM  Knee flexion 75 120 86 102 95 101 Pre: 110, Post 115  Knee extension Lacking 15 0 10 8 5 5  Pre: 5,  Post: 4   (Blank rows = not tested)  LOWER EXTREMITY MMT:  MMT Right eval Left eval Right 03/30/23  Hip flexion 2+ 4+ 4+  Hip extension   4+  Hip abduction 5 5 5   Hip adduction 5 5 5   Knee flexion 3 5 4+  Knee extension 2+ 5 4+  Ankle dorsiflexion 4+ 5 5  Ankle plantarflexion       (Blank rows = not tested)  LOWER EXTREMITY SPECIAL TESTS:    FUNCTIONAL TESTS:  30 seconds chair stand test 1 rep. Gait speed 0.33m/s  GAIT: Distance walked: 50' Assistive device utilized: Environmental consultant - 2 wheeled Level of assistance: Modified independence Comments: R knee flexion throughout stance phase, decreased weight shift to R, decreased gait speed   TODAY'S TREATMENT:  DATE:  04/06/23 Bike L1x72min (seated 9) R SLS 3x with 1 hand support Star excursion LLE 5x 1/2 circle  Step ups 8' R x 10  Step up and over 6' step x 15 Staggered mini wall squats x 10  Knee flexion 25# 2x10 Knee extension 10# 2x10 Prone hang 2# x 1 min Vasopneumatic: Gameready at end of session for post session soreness/edema. 10 min, low compression, 34 deg (coldest)   04/03/23 Bike L1 x 6 min (seat 8) Knee flexion 15# B con/R ecc 2x10 Knee extension 15# B con/R ecc 2x10 Leg Press 25# 2 x 10 bil  Standing TKE with Blue TB x 20  Step downs on aerobic step Review and update of HEP.  Also added knee extension stretch with foot (Demo only today) Therapeutic Activity:  assess progress towards goals Gait x 600' DGI LEFS   03/30/23 Therapeutic Exercise: to improve strength and mobility.  Demo, verbal and tactile cues throughout for technique. Bike L1 x 6 min (seat 9, no rocking needs to start) Knee flexion 10# B con/R ecc 2x10 Knee extension 10# B con/R ecc 2x10 Stairs x 14 with 1 HR and reciprocal step Dips on bottom step for eccentric strengthening.  5x STS Manual Therapy: to decrease muscle spasm and pain and improve mobility IASTM to R quads, Tibia on femur mobs PA/AP for ROM grade III-IV   03/27/2023 Therapeutic Exercise: to improve strength and mobility.  Demo, verbal and tactile cues throughout for technique. Bike L1x32min Star excursion 1/2 circle with LLE 6x Fwd step ups 6'  2x10 R Squats 2x10 with UE support - increased depth  Knee flexion 5# B con/R ecc 2x10 Knee extension 5# B con/R ecc 2x10  PROM R knee flexion and extension Tibia on femur mobs PA/AP for ROM grade III-IV   PATIENT EDUCATION:  Education details: HEP review and update Person educated: Patient Education method: Explanation, Demonstration, Verbal cues, and Handouts Education comprehension: verbalized understanding and returned demonstration  HOME EXERCISE PROGRAM: Access Code: [email or web address] URL: [email or web address] Date: 04/03/2023 Prepared by: Harrie Foreman  Exercises - Supine Heel Slide  - 3 x daily - 7 x weekly - 2 sets - 10 reps - Supine Quad Set  - 3 x daily - 7 x weekly - 2 sets - 10 reps - 5s hold - Supine Active Straight Leg Raise  - 3 x daily - 7 x weekly - 2 sets - 10 reps - Seated Knee Flexion AAROM  - 3 x daily - 7 x weekly - 2 sets - 10 reps - 5s hold - Seated Long Arc Quad  - 3 x daily - 7 x weekly - 2 sets - 10 reps - 5s hold - Seated Knee Flexion Stretch  - 3 x daily - 7 x weekly - 1 sets - 10 reps - 5 sec  hold - [place]  - 1 x daily - 7 x weekly - 2 sets - 10 reps - Backward Weight Shift and Opposite Arm Raise with Walker  - 1 x daily - 7 x weekly - 2 sets - 10 reps - Gastroc Stretch on Wall  - 1 x daily - 7 x weekly - 3 sets - 30 sec hold - Standing Terminal Knee Extension with Resistance  - 2 x daily - 7 x weekly - 2-3 sets - 10 reps - 5 sec  hold - Forward Step Down Touch with Heel  - 1 x daily - 7 x weekly - 2-3 sets - 10  reps - Seated Knee Extension Stretch with Chair  - 2 x daily - 7 x weekly - 1 sets - 1 reps - 3-5 min hold  ASSESSMENT:  CLINICAL IMPRESSION: Pt continues to show difficulty with eccentric step downs simulating descending stairs. He wants to compensate by hyperextending his hip when stepping down instead of bending his R knee. Shows improved knee extension to 3 deg today.  Ronald Bond continues to demonstrate potential  for improvement and would benefit from continued skilled therapy to address impairments.      OBJECTIVE IMPAIRMENTS: Abnormal gait, decreased activity tolerance, decreased balance, decreased endurance, decreased mobility, difficulty walking, decreased ROM, decreased strength, increased edema, increased fascial restrictions, increased muscle spasms, and pain.   ACTIVITY LIMITATIONS: carrying, lifting, bending, sitting, standing, squatting, sleeping, stairs, transfers, bed mobility, and locomotion level  PARTICIPATION LIMITATIONS: meal prep, cleaning, laundry, driving, shopping, community activity, and occupation  PERSONAL FACTORS: Age, Past/current experiences, and 3+ comorbidities: T2DM, CKD, HTN, Hypothyroidism, GERD, Bradycardia, history neck and back pain  are also affecting patient's functional outcome.   REHAB POTENTIAL: Good  CLINICAL DECISION MAKING: Evolving/moderate complexity  EVALUATION COMPLEXITY: Moderate   GOALS: Goals reviewed with patient? Yes   SHORT TERM GOALS: Target date: 03/17/2023    Independent with initial HEP. Baseline: reviewed Goal status: MET 03/06/23- reviewed 03/13/23- partial compliance.  04/03/23- met   LONG TERM GOALS: Target date: 04/14/2023 extended to 05/05/23  Independent with advanced/ongoing HEP to improve outcomes and carryover.  Baseline:  Goal status: IN PROGRESS 04/03/23- updated HEP  2.  Ronald Bond will demonstrate R knee flexion to 120 deg to ascend/descend stairs. Baseline: 75 Goal status: IN PROGRESS 03/06/23- 84 03/13/23- 102 03/30/23- 115  3.  Ronald Bond will demonstrate full R knee extension for safety with gait. Baseline: lacking 15 Goal status: IN PROGRESS 03/13/23- lacking 8 03/30/23- lacking 4  4.  Ronald Bond will be able to ambulate 600' safely with LRAD and normal gait pattern to access community.  Baseline: slow antalgic with RW Goal status: IN PROGRESS 03/13/23- improved heel strike and step length, RW.  03/30/23-  using SPC, and short distances without AD.  04/03/23- 600' without AD, but antalgic, decreased stance time on R, trendelenberg gait.   5.  Ronald Bond will be able to ascend/descend stairs with 1 HR and reciprocal step pattern safely to access home and community.  Baseline: unable Goal status: IN PROGRESS 03/30/23- able to ascend/descend with reciprocal step and HR, but poor eccentric control on R descending.   6.  BRAILYNN LAMATTINA will demonstrate > 19/24 on DGI to demonstrate decreased risk of falls.   Baseline: NT Goal status: MET 04/03/23 -21/24  7.  Ronald Bond will demonstrate improved functional LE strength by completing 5x STS in <20 seconds.   Baseline: 25 sec to complete 1 sit to stand Goal status: IN PROGRESS 03/30/23- 24 seconds to complete 5x STS without UE assist.   8.  Ronald Bond will demonstrate 30/80 on LEFS to demonstrate improved mobility.  Baseline: 0/80 Goal status: IN PROGRESS 04/03/23- 28/80  PLAN:  PT FREQUENCY: 2x/week  PT DURATION: 4 weeks  PLANNED INTERVENTIONS: Therapeutic exercises, Therapeutic activity, Neuromuscular re-education, Balance training, Gait training, Patient/Family education, Self Care, Joint mobilization, Stair training, Orthotic/Fit training, Electrical stimulation, Cryotherapy, Moist heat, Taping, Vasopneumatic device, Manual therapy, and Re-evaluation  PLAN FOR NEXT SESSION: progress exercises, ROM and gait training and balance.  Manual therapy and modalities PRN.  Darleene Cleaver, PTA 04/06/2023, 11:57 AM

## 2023-04-10 ENCOUNTER — Encounter: Payer: Self-pay | Admitting: Internal Medicine

## 2023-04-10 ENCOUNTER — Encounter: Payer: Self-pay | Admitting: Physical Therapy

## 2023-04-10 ENCOUNTER — Ambulatory Visit: Payer: BC Managed Care – PPO | Admitting: Physical Therapy

## 2023-04-10 DIAGNOSIS — R600 Localized edema: Secondary | ICD-10-CM

## 2023-04-10 DIAGNOSIS — M6281 Muscle weakness (generalized): Secondary | ICD-10-CM

## 2023-04-10 DIAGNOSIS — M25561 Pain in right knee: Secondary | ICD-10-CM | POA: Diagnosis not present

## 2023-04-10 DIAGNOSIS — R262 Difficulty in walking, not elsewhere classified: Secondary | ICD-10-CM

## 2023-04-10 DIAGNOSIS — M25661 Stiffness of right knee, not elsewhere classified: Secondary | ICD-10-CM

## 2023-04-10 NOTE — Therapy (Signed)
OUTPATIENT PHYSICAL THERAPY TREATMENT       Patient Name: Ronald Bond MRN: [id number] DOB:1946/05/25, 77 y.o., male Today's Date: 04/10/2023  END OF SESSION:  PT End of Session - 04/10/23 1109     Visit Number 12    Number of Visits 16    Date for PT Re-Evaluation 05/05/23    Authorization Type BCBS, Medicare & [employer]    Progress Note Due on Visit 16    PT Start Time 1108    PT Stop Time 1151    PT Time Calculation (min) 43 min    Activity Tolerance Patient tolerated treatment well    Behavior During Therapy WFL for tasks assessed/performed                   Past Medical History:  Diagnosis Date   Anemia    Chronic kidney disease    GERD (gastroesophageal reflux disease)    HTN (hypertension)    Hyperlipidemia    Hypothyroidism    Osteoarthritis    Pneumonia    Pre-diabetes    Sleep apnea    no CPAP   Wears glasses    Past Surgical History:  Procedure Laterality Date   COLONOSCOPY     EYE SURGERY  2006   both cataracts   MICROLARYNGOSCOPY Left 11/18/2013   Procedure: MICROLARYNGOSCOPY WITH REMOVAL OF GRANULOMA LEFT SIDE;  Surgeon: Serena Colonel, MD;  Location: [facility];  Service: ENT;  Laterality: Left;   NM MYOVIEW LTD  06/2007   Low risk, normal.  No evidence of ischemia or infarction   THYROIDECTOMY  1974   TONSILLECTOMY     as a child   TOTAL KNEE ARTHROPLASTY  2009   left   TOTAL KNEE ARTHROPLASTY Right 02/27/2023   Procedure: TOTAL KNEE ARTHROPLASTY;  Surgeon: Joen Laura, MD;  Location: [facility];  Service: Orthopedics;  Laterality: Right;   TOTAL KNEE REVISION Left 07/06/2022   Procedure: TOTAL KNEE REVISION;  Surgeon: Joen Laura, MD;  Location: [facility];  Service: Orthopedics;  Laterality: Left;   Patient Active Problem List   Diagnosis Date Noted   CKD stage 3 due to type 2 diabetes mellitus (HCC) 03/30/2023   PAC (premature atrial contraction) 12/27/2021   Encounter for general adult medical examination  with abnormal findings 06/22/2021   Bradycardia 11/30/2020   Graves' orbitopathy 07/13/2020   Type II diabetes mellitus with manifestations (HCC) 05/13/2020   Graves disease 10/07/2019   Postablative hypothyroidism 10/07/2019   Thyroid-related proptosis 08/15/2019   Chronic renal disease, stage 3, moderately decreased glomerular filtration rate (GFR) between 30-59 mL/min/1.73 square meter (HCC) 08/15/2019   Long-term current use of opiate analgesic 05/14/2018   Bilateral epiphora 11/16/2017   Thiamine deficiency 06/08/2017   Obesity (BMI 30.0-34.9) 06/04/2017   Hypertensive heart disease without CHF (congestive heart failure) 12/25/2016   Right cervical radiculopathy 06/16/2016   Iron deficiency anemia 03/08/2016   Spinal stenosis in cervical region 08/25/2015   Allergic eczema 08/07/2014   Obstructive sleep apnea 11/12/2013   Mild intermittent asthma 06/03/2013   Herniated lumbar disc without myelopathy 10/18/2012   Allergic rhinitis 03/10/2011   Cardiomegaly 10/18/2010   ERECTILE DYSFUNCTION, NON-ORGANIC, MILD 06/29/2009   Hyperlipidemia associated with type 2 diabetes mellitus (HCC) 05/28/2009   Essential hypertension, benign 05/28/2009   GERD 05/28/2009   BPH associated with nocturia 05/28/2009   Primary osteoarthritis of right knee 05/28/2009    PCP: [name], MD  REFERRING PROVIDER: Weber Cooks  A, MD   REFERRING DIAG: s/p R TKA  THERAPY DIAG:  Stiffness of right knee, not elsewhere classified  Acute pain of right knee  Localized edema  Muscle weakness (generalized)  Difficulty in walking, not elsewhere classified  Rationale for Evaluation and Treatment: Rehabilitation  ONSET DATE: 02/27/23  SUBJECTIVE:   SUBJECTIVE STATEMENT: Stiff today.  Still really aches at night.   NEXT MD VISIT: 04/11/23  PERTINENT HISTORY: T2DM, CKD, HTN, Hypothyroidism, GERD, Bradycardia, history neck and back pain.  PAIN:  Are you having pain? Yes: NPRS scale:  0-3/10 Pain location: R knee Pain description: stiff  PRECAUTIONS: None  WEIGHT BEARING RESTRICTIONS: No  FALLS:  Has patient fallen in last 6 months? 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 Arthroplasty components appear in their expected alignment. No periprosthetic fracture is identified. Expected postoperative changes within the overlying soft tissues.  PATIENT SURVEYS:  LEFS 0/80  COGNITION: Overall cognitive status: Within functional limits for tasks assessed     SENSATION: WFL  EDEMA:  Circumferential: right 49 cm; left 47.5  cm  MUSCLE LENGTH: NT  POSTURE: No Significant postural limitations  PALPATION: Tender calf but no swelling, warmth or redness.  No bleeding under bandage.  Incision covered by hydrocolloid dressing.    LOWER EXTREMITY ROM:  Passive ROM Right eval Left eval Right 03/09/23 Right 03/13/23 Right 4/22/2 AROM Right 03/23/23 AROM Right 03/30/23 PROM Right 04/10/23  Knee flexion 75 120 86 102 95 101 Pre: 110, Post 115 115  Knee extension Lacking 15 0 10 8 5 5  Pre: 5,  Post: 4 3   (Blank rows = not tested)  LOWER EXTREMITY MMT:  MMT Right eval Left eval Right 03/30/23  Hip flexion 2+ 4+ 4+  Hip extension   4+  Hip abduction 5 5 5   Hip adduction 5 5 5   Knee flexion 3 5 4+  Knee extension 2+ 5 4+  Ankle dorsiflexion 4+ 5 5  Ankle  plantarflexion      (Blank rows = not tested)  LOWER EXTREMITY SPECIAL TESTS:    FUNCTIONAL TESTS:  30 seconds chair stand test 1 rep. Gait speed 0.58m/s  GAIT: Distance walked: 50' Assistive device utilized: Environmental consultant - 2 wheeled Level of assistance: Modified independence Comments: R knee flexion throughout stance phase, decreased weight shift to R, decreased gait speed   TODAY'S TREATMENT:  DATE:  04/10/23  Bike L2 x 6 min (seat 9) - had to rock initially Gait x 1200' Stairs x 14 with reciprocal steps Step downs - starting 6" x 10 RLE, progressing to 8"  x 10 RLE - for eccentric strengthening.  5x STS  Manual Therapy: to decrease muscle spasm and pain and improve mobility STM/TPR to R quads and hamstrings, skilled palpation and monitoring during dry needling. Trigger Point Dry-Needling  Treatment instructions: Expect mild to moderate muscle soreness. S/S of pneumothorax if dry needled over a lung field, and to seek immediate medical attention should they occur. Patient verbalized understanding of these instructions and education. Patient Consent Given: Yes Education handout provided: Yes Muscles treated: R quads - medialis and lateralis Electrical stimulation performed: No Parameters: N/A Treatment response/outcome: Twitch Response Elicited and Palpable Increase in Muscle Length    04/06/23 Bike L1x23min (seated 9) R SLS 3x with 1 hand support Star excursion LLE 5x 1/2 circle  Step ups 8' R x 10  Step up and over 6' step x 15 Staggered mini wall squats x 10  Knee flexion 25# 2x10 Knee extension 10# 2x10 Prone hang 2# x 1 min Vasopneumatic: Gameready at end of session for post session soreness/edema. 10 min, low compression, 34 deg (coldest)   04/03/23 Bike L1 x 6 min (seat 8) Knee flexion 15# B con/R ecc 2x10 Knee extension 15# B con/R ecc 2x10 Leg  Press 25# 2 x 10 bil  Standing TKE with Blue TB x 20  Step downs on aerobic step Review and update of HEP.  Also added knee extension stretch with foot (Demo only today) Therapeutic Activity:  assess progress towards goals Gait x 600' DGI LEFS   03/30/23 Therapeutic Exercise: to improve strength and mobility.  Demo, verbal and tactile cues throughout for technique. Bike L1 x 6 min (seat 9, no rocking needs to start) Knee flexion 10# B con/R ecc 2x10 Knee extension 10# B con/R ecc 2x10 Stairs x 14 with 1 HR and reciprocal step Dips on bottom step for eccentric strengthening.  5x STS Manual Therapy: to decrease muscle spasm and pain and improve mobility IASTM to R quads, Tibia on femur mobs PA/AP for ROM grade III-IV   03/27/2023 Therapeutic Exercise: to improve strength and mobility.  Demo, verbal and tactile cues throughout for technique. Bike L1x64min Star excursion 1/2 circle with LLE 6x Fwd step ups 6' 2x10 R Squats 2x10 with UE support - increased depth  Knee flexion 5# B con/R ecc 2x10 Knee extension 5# B con/R ecc 2x10  PROM R knee flexion and extension Tibia on femur mobs PA/AP for ROM grade III-IV   PATIENT EDUCATION:  Education details: HEP review and update Person educated: Patient Education method: Explanation, Demonstration, Verbal cues, and Handouts Education comprehension: verbalized understanding and returned demonstration  HOME EXERCISE PROGRAM: Access Code: [email or web address] URL: [email or web address] Date: 04/03/2023 Prepared by: Harrie Foreman  Exercises - Supine Heel Slide  - 3 x daily - 7 x weekly - 2 sets - 10 reps - Supine Quad Set  - 3 x daily - 7 x weekly - 2 sets - 10 reps - 5s hold - Supine Active Straight Leg Raise  - 3 x daily - 7 x weekly - 2 sets - 10 reps - Seated Knee Flexion AAROM  - 3 x daily - 7 x weekly - 2 sets - 10 reps - 5s hold - Seated Long Arc Quad  - 3 x daily - 7 x weekly -  2 sets - 10 reps - 5s hold - Seated Knee  Flexion Stretch  - 3 x daily - 7 x weekly - 1 sets - 10 reps - 5 sec  hold - [place]  - 1 x daily - 7 x weekly - 2 sets - 10 reps - Backward Weight Shift and Opposite Arm Raise with Walker  - 1 x daily - 7 x weekly - 2 sets - 10 reps - Gastroc Stretch on Wall  - 1 x daily - 7 x weekly - 3 sets - 30 sec hold - Standing Terminal Knee Extension with Resistance  - 2 x daily - 7 x weekly - 2-3 sets - 10 reps - 5 sec  hold - Forward Step Down Touch with Heel  - 1 x daily - 7 x weekly - 2-3 sets - 10 reps - Seated Knee Extension Stretch with Chair  - 2 x daily - 7 x weekly - 1 sets - 1 reps - 3-5 min hold  ASSESSMENT:  CLINICAL IMPRESSION: Ronald Bond brought up his daughter, whom this PT had treated this morning, and dry needling, which he said she had very positive response to.  After discussion about purposes of TrDN and risks/side effects, consented to TrDN of quads well away from incision and hardware, to decrease tightness in quads, where he reports soreness and pain at night, with immediate decrease in tightness and patient reporting improved comfort with gait and feeling improving AROM.  Ronald Bond is making good progress with PROM of R knee 3-115, and improved functional strength, meeting LTG #7 today.  He still has eccentric quad weakness apparent when descending stairs and gait deviations with walking, so would benefit from continued skilled therapy.    OBJECTIVE IMPAIRMENTS: Abnormal gait, decreased activity tolerance, decreased balance, decreased endurance, decreased mobility, difficulty walking, decreased ROM, decreased strength, increased edema, increased fascial restrictions, increased muscle spasms, and pain.   ACTIVITY LIMITATIONS: carrying, lifting, bending, sitting, standing, squatting, sleeping, stairs, transfers, bed mobility, and locomotion level  PARTICIPATION LIMITATIONS: meal prep, cleaning, laundry, driving, shopping, community activity, and occupation  PERSONAL  FACTORS: Age, Past/current experiences, and 3+ comorbidities: T2DM, CKD, HTN, Hypothyroidism, GERD, Bradycardia, history neck and back pain  are also affecting patient's functional outcome.   REHAB POTENTIAL: Good  CLINICAL DECISION MAKING: Evolving/moderate complexity  EVALUATION COMPLEXITY: Moderate   GOALS: Goals reviewed with patient? Yes   SHORT TERM GOALS: Target date: 03/17/2023    Independent with initial HEP. Baseline: reviewed Goal status: MET 03/06/23- reviewed 03/13/23- partial compliance.  04/03/23- met   LONG TERM GOALS: Target date: 04/14/2023 extended to 05/05/23  Independent with advanced/ongoing HEP to improve outcomes and carryover.  Baseline:  Goal status: IN PROGRESS 04/03/23- updated HEP  2.  Ronald Bond will demonstrate R knee flexion to 120 deg to ascend/descend stairs. Baseline: 75 Goal status: IN PROGRESS 03/06/23- 84 03/13/23- 102 03/30/23- 115  3.  Ronald Bond will demonstrate full R knee extension for safety with gait. Baseline: lacking 15 Goal status: IN PROGRESS 03/13/23- lacking 8 03/30/23- lacking 4  4.  Ronald Bond will be able to ambulate 600' safely with LRAD and normal gait pattern to access community.  Baseline: slow antalgic with RW Goal status: IN PROGRESS 03/13/23- improved heel strike and step length, RW.  03/30/23- using SPC, and short distances without AD.  04/03/23- 600' without AD, but antalgic, decreased stance time on R, trendelenberg gait.   5.  Ronald Bond  will be able to ascend/descend stairs with 1 HR and reciprocal step pattern safely to access home and community.  Baseline: unable Goal status: IN PROGRESS 03/30/23- able to ascend/descend with reciprocal step and HR, but poor eccentric control on R descending.   6.  Ronald Bond will demonstrate > 19/24 on DGI to demonstrate decreased risk of falls.   Baseline: NT Goal status: MET 04/03/23 -21/24  7.  Ronald Bond will demonstrate improved functional LE strength by  completing 5x STS in <20 seconds.   Baseline: 25 sec to complete 1 sit to stand Goal status: MET 03/30/23- 24 seconds to complete 5x STS without UE assist.  04/10/23- 17 seconds.   8.  Ronald Bond will demonstrate 30/80 on LEFS to demonstrate improved mobility.  Baseline: 0/80 Goal status: IN PROGRESS 04/03/23- 28/80  PLAN:  PT FREQUENCY: 2x/week  PT DURATION: 4 weeks  PLANNED INTERVENTIONS: Therapeutic exercises, Therapeutic activity, Neuromuscular re-education, Balance training, Gait training, Patient/Family education, Self Care, Joint mobilization, Stair training, Orthotic/Fit training, Electrical stimulation, Cryotherapy, Moist heat, Taping, Vasopneumatic device, Manual therapy, and Re-evaluation  PLAN FOR NEXT SESSION: progress exercises, ROM and gait training and balance.  Manual therapy and modalities PRN.    Jena Gauss, PT, DPT  04/10/2023, 11:58 AM

## 2023-04-11 DIAGNOSIS — M1711 Unilateral primary osteoarthritis, right knee: Secondary | ICD-10-CM | POA: Diagnosis not present

## 2023-04-12 ENCOUNTER — Ambulatory Visit: Payer: BC Managed Care – PPO

## 2023-04-12 DIAGNOSIS — M25561 Pain in right knee: Secondary | ICD-10-CM

## 2023-04-12 DIAGNOSIS — M6281 Muscle weakness (generalized): Secondary | ICD-10-CM

## 2023-04-12 DIAGNOSIS — R600 Localized edema: Secondary | ICD-10-CM

## 2023-04-12 DIAGNOSIS — R262 Difficulty in walking, not elsewhere classified: Secondary | ICD-10-CM

## 2023-04-12 DIAGNOSIS — M25661 Stiffness of right knee, not elsewhere classified: Secondary | ICD-10-CM

## 2023-04-12 NOTE — Therapy (Signed)
OUTPATIENT PHYSICAL THERAPY TREATMENT       Patient Name: Ronald Bond MRN: [id number] DOB:[birth year], 77 y.o., male Today's Date: 04/12/2023  END OF SESSION:  PT End of Session - 04/12/23 0850     Visit Number 13    Number of Visits 16    Date for PT Re-Evaluation 05/05/23    Authorization Type BCBS, Medicare & Devoted    Progress Note Due on Visit 16    PT Start Time 0847    PT Stop Time 0929    PT Time Calculation (min) 42 min    Activity Tolerance Patient tolerated treatment well    Behavior During Therapy [facility] for tasks assessed/performed                   Past Medical History:  Diagnosis Date   Anemia    Chronic kidney disease    GERD (gastroesophageal reflux disease)    HTN (hypertension)    Hyperlipidemia    Hypothyroidism    Osteoarthritis    Pneumonia    Pre-diabetes    Sleep apnea    no CPAP   Wears glasses    Past Surgical History:  Procedure Laterality Date   COLONOSCOPY     EYE SURGERY  2006   both cataracts   MICROLARYNGOSCOPY Left 11/18/2013   Procedure: MICROLARYNGOSCOPY WITH REMOVAL OF GRANULOMA LEFT SIDE;  Surgeon: Serena Colonel, MD;  Location: [facility];  Service: ENT;  Laterality: Left;   NM MYOVIEW LTD  06/2007   Low risk, normal.  No evidence of ischemia or infarction   THYROIDECTOMY  1974   TONSILLECTOMY     as a child   TOTAL KNEE ARTHROPLASTY  2009   left   TOTAL KNEE ARTHROPLASTY Right 02/27/2023   Procedure: TOTAL KNEE ARTHROPLASTY;  Surgeon: Joen Laura, MD;  Location: [facility];  Service: Orthopedics;  Laterality: Right;   TOTAL KNEE REVISION Left 07/06/2022   Procedure: TOTAL KNEE REVISION;  Surgeon: Joen Laura, MD;  Location: [facility];  Service: Orthopedics;  Laterality: Left;   Patient Active Problem List   Diagnosis Date Noted   CKD stage 3 due to type 2 diabetes mellitus (HCC) 03/30/2023   PAC (premature atrial contraction) 12/27/2021   Encounter for general adult medical examination  with abnormal findings 06/22/2021   Bradycardia 11/30/2020   Graves' orbitopathy 07/13/2020   Type II diabetes mellitus with manifestations (HCC) 05/13/2020   Graves disease 10/07/2019   Postablative hypothyroidism 10/07/2019   Thyroid-related proptosis 08/15/2019   Chronic renal disease, stage 3, moderately decreased glomerular filtration rate (GFR) between 30-59 mL/min/1.73 square meter (HCC) 08/15/2019   Long-term current use of opiate analgesic 05/14/2018   Bilateral epiphora 11/16/2017   Thiamine deficiency 06/08/2017   Obesity (BMI 30.0-34.9) 06/04/2017   Hypertensive heart disease without CHF (congestive heart failure) 12/25/2016   Right cervical radiculopathy 06/16/2016   Iron deficiency anemia 03/08/2016   Spinal stenosis in cervical region 08/25/2015   Allergic eczema 08/07/2014   Obstructive sleep apnea 11/12/2013   Mild intermittent asthma 06/03/2013   Herniated lumbar disc without myelopathy 10/18/2012   Allergic rhinitis 03/10/2011   Cardiomegaly 10/18/2010   ERECTILE DYSFUNCTION, NON-ORGANIC, MILD 06/29/2009   Hyperlipidemia associated with type 2 diabetes mellitus (HCC) 05/28/2009   Essential hypertension, benign 05/28/2009   GERD 05/28/2009   BPH associated with nocturia 05/28/2009   Primary osteoarthritis of right knee 05/28/2009    PCP: [name], MD  REFERRING PROVIDER: Weber Cooks  A, MD   REFERRING DIAG: s/p R TKA  THERAPY DIAG:  Stiffness of right knee, not elsewhere classified  Acute pain of right knee  Localized edema  Muscle weakness (generalized)  Difficulty in walking, not elsewhere classified  Rationale for Evaluation and Treatment: Rehabilitation  ONSET DATE: 02/27/23  SUBJECTIVE:   SUBJECTIVE STATEMENT: Pt reports his doctor wants 5 more degrees of flexion. DN helped he actually slept throughout the whole night.  NEXT MD VISIT: 04/11/23  PERTINENT HISTORY: T2DM, CKD, HTN, Hypothyroidism, GERD, Bradycardia, history neck  and back pain.  PAIN:  Are you having pain? Yes: NPRS scale: 0/10 Pain location: R knee Pain description: stiff  PRECAUTIONS: None  WEIGHT BEARING RESTRICTIONS: No  FALLS:  Has patient fallen in last 6 months? 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 Arthroplasty components appear in their expected alignment. No periprosthetic fracture is identified. Expected postoperative changes within the overlying soft tissues.  PATIENT SURVEYS:  LEFS 0/80  COGNITION: Overall cognitive status: Within functional limits for tasks assessed     SENSATION: WFL  EDEMA:  Circumferential: right 49 cm; left 47.5  cm  MUSCLE LENGTH: NT  POSTURE: No Significant postural limitations  PALPATION: Tender calf but no swelling, warmth or redness.  No bleeding under bandage.  Incision covered by hydrocolloid dressing.    LOWER EXTREMITY ROM:  Passive ROM Right eval Left eval Right 03/09/23 Right 03/13/23 Right 4/22/2 AROM Right 03/23/23 AROM Right 03/30/23 PROM Right 04/10/23  Knee flexion 75 120 86 102 95 101 Pre: 110, Post 115 115  Knee extension Lacking 15 0 10 8 5 5  Pre: 5,  Post: 4 3   (Blank rows = not tested)  LOWER EXTREMITY MMT:  MMT Right eval Left eval Right 03/30/23  Hip flexion 2+ 4+ 4+  Hip extension   4+  Hip abduction 5 5 5   Hip adduction 5 5 5   Knee flexion 3 5 4+  Knee  extension 2+ 5 4+  Ankle dorsiflexion 4+ 5 5  Ankle plantarflexion      (Blank rows = not tested)  LOWER EXTREMITY SPECIAL TESTS:    FUNCTIONAL TESTS:  30 seconds chair stand test 1 rep. Gait speed 0.22m/s  GAIT: Distance walked: 50' Assistive device utilized: Environmental consultant - 2 wheeled Level of assistance: Modified independence Comments: R knee flexion throughout stance phase, decreased weight shift to R, decreased gait speed   TODAY'S TREATMENT:  DATE: 04/12/23 Bike L2x8min seat 8 Fwd eccentric step down RLE 2x10 6' step Lateral step down with L heel tap 6' step x 10 Leg press 25# BLE 2x10; 15# RLE x 12 Knee flexion 25# 2x10 BLE; 10# RLE x 10 Knee extension 15# BLE 2x10; 5# 2x10 RLE  04/10/23  Bike L2 x 6 min (seat 9) - had to rock initially Gait x 1200' Stairs x 14 with reciprocal steps Step downs - starting 6" x 10 RLE, progressing to 8"  x 10 RLE - for eccentric strengthening.  5x STS  Manual Therapy: to decrease muscle spasm and pain and improve mobility STM/TPR to R quads and hamstrings, skilled palpation and monitoring during dry needling. Trigger Point Dry-Needling  Treatment instructions: Expect mild to moderate muscle soreness. S/S of pneumothorax if dry needled over a lung field, and to seek immediate medical attention should they occur. Patient verbalized understanding of these instructions and education. Patient Consent Given: Yes Education handout provided: Yes Muscles treated: R quads - medialis and lateralis Electrical stimulation performed: No Parameters: N/A Treatment response/outcome: Twitch Response Elicited and Palpable Increase in Muscle Length    04/06/23 Bike L1x12min (seated 9) R SLS 3x with 1 hand support Star excursion LLE 5x 1/2 circle  Step ups 8' R x 10  Step up and over 6' step x 15 Staggered mini wall squats x 10  Knee  flexion 25# 2x10 Knee extension 10# 2x10 Prone hang 2# x 1 min Vasopneumatic: Gameready at end of session for post session soreness/edema. 10 min, low compression, 34 deg (coldest)   04/03/23 Bike L1 x 6 min (seat 8) Knee flexion 15# B con/R ecc 2x10 Knee extension 15# B con/R ecc 2x10 Leg Press 25# 2 x 10 bil  Standing TKE with Blue TB x 20  Step downs on aerobic step Review and update of HEP.  Also added knee extension stretch with foot (Demo only today) Therapeutic Activity:  assess progress towards goals Gait x 600' DGI LEFS   03/30/23 Therapeutic Exercise: to improve strength and mobility.  Demo, verbal and tactile cues throughout for technique. Bike L1 x 6 min (seat 9, no rocking needs to start) Knee flexion 10# B con/R ecc 2x10 Knee extension 10# B con/R ecc 2x10 Stairs x 14 with 1 HR and reciprocal step Dips on bottom step for eccentric strengthening.  5x STS Manual Therapy: to decrease muscle spasm and pain and improve mobility IASTM to R quads, Tibia on femur mobs PA/AP for ROM grade III-IV   03/27/2023 Therapeutic Exercise: to improve strength and mobility.  Demo, verbal and tactile cues throughout for technique. Bike L1x58min Star excursion 1/2 circle with LLE 6x Fwd step ups 6' 2x10 R Squats 2x10 with UE support - increased depth  Knee flexion 5# B con/R ecc 2x10 Knee extension 5# B con/R ecc 2x10  PROM R knee flexion and extension Tibia on femur mobs PA/AP for ROM grade III-IV   PATIENT EDUCATION:  Education details: HEP review and update Person educated: Patient Education method: Explanation, Demonstration, Verbal cues, and Handouts Education comprehension: verbalized understanding and returned demonstration  HOME EXERCISE PROGRAM: Access Code: [email or web address] URL: [email or web address] Date: 04/03/2023 Prepared by: Harrie Foreman  Exercises - Supine Heel Slide  - 3 x daily - 7 x weekly - 2 sets - 10 reps - Supine Quad Set  - 3 x daily - 7 x  weekly - 2 sets - 10 reps - 5s hold - Supine Active Straight Leg Raise  -  3 x daily - 7 x weekly - 2 sets - 10 reps - Seated Knee Flexion AAROM  - 3 x daily - 7 x weekly - 2 sets - 10 reps - 5s hold - Seated Long Arc Quad  - 3 x daily - 7 x weekly - 2 sets - 10 reps - 5s hold - Seated Knee Flexion Stretch  - 3 x daily - 7 x weekly - 1 sets - 10 reps - 5 sec  hold - [place]  - 1 x daily - 7 x weekly - 2 sets - 10 reps - Backward Weight Shift and Opposite Arm Raise with Walker  - 1 x daily - 7 x weekly - 2 sets - 10 reps - Gastroc Stretch on Wall  - 1 x daily - 7 x weekly - 3 sets - 30 sec hold - Standing Terminal Knee Extension with Resistance  - 2 x daily - 7 x weekly - 2-3 sets - 10 reps - 5 sec  hold - Forward Step Down Touch with Heel  - 1 x daily - 7 x weekly - 2-3 sets - 10 reps - Seated Knee Extension Stretch with Chair  - 2 x daily - 7 x weekly - 1 sets - 1 reps - 3-5 min hold  ASSESSMENT:  CLINICAL IMPRESSION:  Progressed exercises focusing on knee strengthening to improve functional capacity. Pt w/o any complaints during interventions. Explained that his MD wants 5 more degrees of flexion. He is having some difficulty with fwd eccentric step downs w/o compensating with his trunk. Pt would continue to benefit from skilled therapy.  OBJECTIVE IMPAIRMENTS: Abnormal gait, decreased activity tolerance, decreased balance, decreased endurance, decreased mobility, difficulty walking, decreased ROM, decreased strength, increased edema, increased fascial restrictions, increased muscle spasms, and pain.   ACTIVITY LIMITATIONS: carrying, lifting, bending, sitting, standing, squatting, sleeping, stairs, transfers, bed mobility, and locomotion level  PARTICIPATION LIMITATIONS: meal prep, cleaning, laundry, driving, shopping, community activity, and occupation  PERSONAL FACTORS: Age, Past/current experiences, and 3+ comorbidities: T2DM, CKD, HTN, Hypothyroidism, GERD, Bradycardia, history neck  and back pain  are also affecting patient's functional outcome.   REHAB POTENTIAL: Good  CLINICAL DECISION MAKING: Evolving/moderate complexity  EVALUATION COMPLEXITY: Moderate   GOALS: Goals reviewed with patient? Yes   SHORT TERM GOALS: Target date: 03/17/2023    Independent with initial HEP. Baseline: reviewed Goal status: MET 03/06/23- reviewed 03/13/23- partial compliance.  04/03/23- met   LONG TERM GOALS: Target date: 04/14/2023 extended to 05/05/23  Independent with advanced/ongoing HEP to improve outcomes and carryover.  Baseline:  Goal status: IN PROGRESS 04/03/23- updated HEP  2.  [name] will demonstrate R knee flexion to 120 deg to ascend/descend stairs. Baseline: 75 Goal status: IN PROGRESS 03/06/23- 84 03/13/23- 102 03/30/23- 115  3.  [name] will demonstrate full R knee extension for safety with gait. Baseline: lacking 15 Goal status: IN PROGRESS 03/13/23- lacking 8 03/30/23- lacking 4  4.  [name] will be able to ambulate 600' safely with LRAD and normal gait pattern to access community.  Baseline: slow antalgic with RW Goal status: IN PROGRESS 03/13/23- improved heel strike and step length, RW.  03/30/23- using SPC, and short distances without AD.  04/03/23- 600' without AD, but antalgic, decreased stance time on R, trendelenberg gait.   5.  [name] will be able to ascend/descend stairs with 1 HR and reciprocal step pattern safely to access home and community.  Baseline: unable Goal  status: IN PROGRESS 03/30/23- able to ascend/descend with reciprocal step and HR, but poor eccentric control on R descending.   6.  PAYNE TESKEY will demonstrate > 19/24 on DGI to demonstrate decreased risk of falls.   Baseline: NT Goal status: MET 04/03/23 -21/24  7.  [name] will demonstrate improved functional LE strength by completing 5x STS in <20 seconds.   Baseline: 25 sec to complete 1 sit to stand Goal status: MET 03/30/23- 24 seconds to complete  5x STS without UE assist.  04/10/23- 17 seconds.   8.  PRADYUN GOH will demonstrate 30/80 on LEFS to demonstrate improved mobility.  Baseline: 0/80 Goal status: IN PROGRESS 04/03/23- 28/80  PLAN:  PT FREQUENCY: 2x/week  PT DURATION: 4 weeks  PLANNED INTERVENTIONS: Therapeutic exercises, Therapeutic activity, Neuromuscular re-education, Balance training, Gait training, Patient/Family education, Self Care, Joint mobilization, Stair training, Orthotic/Fit training, Electrical stimulation, Cryotherapy, Moist heat, Taping, Vasopneumatic device, Manual therapy, and Re-evaluation  PLAN FOR NEXT SESSION: progress exercises, ROM and gait training and balance.  Manual therapy and modalities PRN.    Darleene Cleaver, PTA 04/12/2023, 9:32 AM

## 2023-04-17 ENCOUNTER — Ambulatory Visit: Payer: BC Managed Care – PPO

## 2023-04-19 ENCOUNTER — Encounter: Payer: Self-pay | Admitting: Physical Therapy

## 2023-04-19 ENCOUNTER — Ambulatory Visit: Payer: BC Managed Care – PPO | Admitting: Physical Therapy

## 2023-04-19 DIAGNOSIS — M25561 Pain in right knee: Secondary | ICD-10-CM

## 2023-04-19 DIAGNOSIS — R262 Difficulty in walking, not elsewhere classified: Secondary | ICD-10-CM

## 2023-04-19 DIAGNOSIS — R600 Localized edema: Secondary | ICD-10-CM

## 2023-04-19 DIAGNOSIS — M25661 Stiffness of right knee, not elsewhere classified: Secondary | ICD-10-CM

## 2023-04-19 DIAGNOSIS — M6281 Muscle weakness (generalized): Secondary | ICD-10-CM

## 2023-04-19 NOTE — Therapy (Signed)
OUTPATIENT PHYSICAL THERAPY TREATMENT       Patient Name: Ronald Bond MRN: [id number] DOB:07-06-1946, 77 y.o., male Today's Date: 04/19/2023  END OF SESSION:  PT End of Session - 04/19/23 1707     Visit Number 14    Number of Visits 16    Date for PT Re-Evaluation 05/05/23    Authorization Type BCBS, Medicare & Devoted    Progress Note Due on Visit 16    PT Start Time 1702    PT Stop Time 1740    PT Time Calculation (min) 38 min    Activity Tolerance Patient tolerated treatment well    Behavior During Therapy WFL for tasks assessed/performed                   Past Medical History:  Diagnosis Date   Anemia    Chronic kidney disease    GERD (gastroesophageal reflux disease)    HTN (hypertension)    Hyperlipidemia    Hypothyroidism    Osteoarthritis    Pneumonia    Pre-diabetes    Sleep apnea    no CPAP   Wears glasses    Past Surgical History:  Procedure Laterality Date   COLONOSCOPY     EYE SURGERY  2006   both cataracts   MICROLARYNGOSCOPY Left 11/18/2013   Procedure: MICROLARYNGOSCOPY WITH REMOVAL OF GRANULOMA LEFT SIDE;  Surgeon: Serena Colonel, MD;  Location: [facility];  Service: ENT;  Laterality: Left;   NM MYOVIEW LTD  06/2007   Low risk, normal.  No evidence of ischemia or infarction   THYROIDECTOMY  1974   TONSILLECTOMY     as a child   TOTAL KNEE ARTHROPLASTY  2009   left   TOTAL KNEE ARTHROPLASTY Right 02/27/2023   Procedure: TOTAL KNEE ARTHROPLASTY;  Surgeon: Joen Laura, MD;  Location: [facility];  Service: Orthopedics;  Laterality: Right;   TOTAL KNEE REVISION Left 07/06/2022   Procedure: TOTAL KNEE REVISION;  Surgeon: Joen Laura, MD;  Location: [facility];  Service: Orthopedics;  Laterality: Left;   Patient Active Problem List   Diagnosis Date Noted   CKD stage 3 due to type 2 diabetes mellitus (HCC) 03/30/2023   PAC (premature atrial contraction) 12/27/2021   Encounter for general adult medical examination  with abnormal findings 06/22/2021   Bradycardia 11/30/2020   Graves' orbitopathy 07/13/2020   Type II diabetes mellitus with manifestations (HCC) 05/13/2020   Graves disease 10/07/2019   Postablative hypothyroidism 10/07/2019   Thyroid-related proptosis 08/15/2019   Chronic renal disease, stage 3, moderately decreased glomerular filtration rate (GFR) between 30-59 mL/min/1.73 square meter (HCC) 08/15/2019   Long-term current use of opiate analgesic 05/14/2018   Bilateral epiphora 11/16/2017   Thiamine deficiency 06/08/2017   Obesity (BMI 30.0-34.9) 06/04/2017   Hypertensive heart disease without CHF (congestive heart failure) 12/25/2016   Right cervical radiculopathy 06/16/2016   Iron deficiency anemia 03/08/2016   Spinal stenosis in cervical region 08/25/2015   Allergic eczema 08/07/2014   Obstructive sleep apnea 11/12/2013   Mild intermittent asthma 06/03/2013   Herniated lumbar disc without myelopathy 10/18/2012   Allergic rhinitis 03/10/2011   Cardiomegaly 10/18/2010   ERECTILE DYSFUNCTION, NON-ORGANIC, MILD 06/29/2009   Hyperlipidemia associated with type 2 diabetes mellitus (HCC) 05/28/2009   Essential hypertension, benign 05/28/2009   GERD 05/28/2009   BPH associated with nocturia 05/28/2009   Primary osteoarthritis of right knee 05/28/2009    PCP: [name], MD  REFERRING PROVIDER: Weber Cooks  A, MD   REFERRING DIAG: s/p R TKA  THERAPY DIAG:  Stiffness of right knee, not elsewhere classified  Acute pain of right knee  Localized edema  Muscle weakness (generalized)  Difficulty in walking, not elsewhere classified  Rationale for Evaluation and Treatment: Rehabilitation  ONSET DATE: 02/27/23  SUBJECTIVE:   SUBJECTIVE STATEMENT: Still feeling the knee a bit.  Working in garden today.   NEXT MD VISIT: 04/11/23  PERTINENT HISTORY: T2DM, CKD, HTN, Hypothyroidism, GERD, Bradycardia, history neck and back pain.  PAIN:  Are you having pain? Yes:  NPRS scale: 0/10 Pain location: R knee Pain description: stiff  PRECAUTIONS: None  WEIGHT BEARING RESTRICTIONS: No  FALLS:  Has patient fallen in last 6 months? 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 Arthroplasty components appear in their expected alignment. No periprosthetic fracture is identified. Expected postoperative changes within the overlying soft tissues.  PATIENT SURVEYS:  LEFS 0/80  COGNITION: Overall cognitive status: Within functional limits for tasks assessed     SENSATION: WFL  EDEMA:  Circumferential: right 49 cm; left 47.5  cm  MUSCLE LENGTH: NT  POSTURE: No Significant postural limitations  PALPATION: Tender calf but no swelling, warmth or redness.  No bleeding under bandage.  Incision covered by hydrocolloid dressing.    LOWER EXTREMITY ROM:  Passive ROM Right eval Left eval Right 03/09/23 Right 03/13/23 Right 4/22/2 AROM Right 03/23/23 AROM Right 03/30/23 PROM Right 04/10/23 Right 5/22/242  Knee flexion 75 120 86 102 95 101 Pre: 110, Post 115 115 120 PROM  Knee extension Lacking 15 0 10 8 5 5  Pre: 5,  Post: 4 3    (Blank rows = not tested)  LOWER EXTREMITY MMT:  MMT Right eval Left eval Right 03/30/23  Hip flexion 2+ 4+ 4+  Hip extension   4+  Hip abduction 5 5 5   Hip adduction 5 5 5   Knee flexion 3 5 4+  Knee extension 2+ 5 4+  Ankle  dorsiflexion 4+ 5 5  Ankle plantarflexion      (Blank rows = not tested)  LOWER EXTREMITY SPECIAL TESTS:    FUNCTIONAL TESTS:  30 seconds chair stand test 1 rep. Gait speed 0.37m/s  GAIT: Distance walked: 50' Assistive device utilized: Environmental consultant - 2 wheeled Level of assistance: Modified independence Comments: R knee flexion throughout stance phase, decreased weight shift to R, decreased gait speed   TODAY'S TREATMENT:  DATE:  04/19/23 Therapeutic Exercise: to improve strength and mobility.  Demo, verbal and tactile cues throughout for technique. Bike L3 x 6 min seat 7 Steps downs (heel tap) 2 x 10 bil 6" Knee extension 25# 2 x 10, 30# 1 x 10  bil conc, RLE eccentric phase Knee flexion 25# 2 x 10 bil  Squats x 10 Prone bent knee raises x 10 - hamstring cramp Prone leg extensions x 10  Supine bridges x 10  Manual Therapy: to decrease muscle spasm and pain and improve mobility Prone with R hip hyperextended prone knee flexion stretch with rotational movements to relax glutes and contract relax to relax quads, to stretch proximal rectus femoris.  Supine R knee flexion with distraction, PA mob and IR of tibia.  04/12/23 Bike L2x33min seat 8 Fwd eccentric step down RLE 2x10 6' step Lateral step down with L heel tap 6' step x 10 Leg press 25# BLE 2x10; 15# RLE x 12 Knee flexion 25# 2x10 BLE; 10# RLE x 10 Knee extension 15# BLE 2x10; 5# 2x10 RLE  04/10/23  Bike L2 x 6 min (seat 9) - had to rock initially Gait x 1200' Stairs x 14 with reciprocal steps Step downs - starting 6" x 10 RLE, progressing to 8"  x 10 RLE - for eccentric strengthening.  5x STS  Manual Therapy: to decrease muscle spasm and pain and improve mobility STM/TPR to R quads and hamstrings, skilled palpation and monitoring during dry needling. Trigger Point Dry-Needling  Treatment instructions:  Expect mild to moderate muscle soreness. S/S of pneumothorax if dry needled over a lung field, and to seek immediate medical attention should they occur. Patient verbalized understanding of these instructions and education. Patient Consent Given: Yes Education handout provided: Yes Muscles treated: R quads - medialis and lateralis Electrical stimulation performed: No Parameters: N/A Treatment response/outcome: Twitch Response Elicited and Palpable Increase in Muscle Length    PATIENT EDUCATION:  Education details: HEP review and update Person educated: Patient Education method: Explanation, Demonstration, Verbal cues, and Handouts Education comprehension: verbalized understanding and returned demonstration  HOME EXERCISE PROGRAM: Access Code: [email or web address] URL: [email or web address] Date: 04/03/2023 Prepared by: Harrie Foreman  Exercises - Supine Heel Slide  - 3 x daily - 7 x weekly - 2 sets - 10 reps - Supine Quad Set  - 3 x daily - 7 x weekly - 2 sets - 10 reps - 5s hold - Supine Active Straight Leg Raise  - 3 x daily - 7 x weekly - 2 sets - 10 reps - Seated Knee Flexion AAROM  - 3 x daily - 7 x weekly - 2 sets - 10 reps - 5s hold - Seated Long Arc Quad  - 3 x daily - 7 x weekly - 2 sets - 10 reps - 5s hold - Seated Knee Flexion Stretch  - 3 x daily - 7 x weekly - 1 sets - 10 reps - 5 sec  hold - [place]  - 1 x daily - 7 x weekly - 2 sets - 10 reps - Backward Weight Shift and Opposite Arm Raise with Walker  - 1 x daily - 7 x weekly - 2 sets - 10 reps - Gastroc Stretch on Wall  - 1 x daily - 7 x weekly - 3 sets - 30 sec hold - Standing Terminal Knee Extension with Resistance  - 2 x daily - 7 x weekly - 2-3 sets - 10 reps - 5 sec  hold - Forward  Step Down Touch with Heel  - 1 x daily - 7 x weekly - 2-3 sets - 10 reps - Seated Knee Extension Stretch with Chair  - 2 x daily - 7 x weekly - 1 sets - 1 reps - 3-5 min hold  ASSESSMENT:  CLINICAL IMPRESSION:  KYDEN TARRENCE is making good progress, reports still landing hard on L foot when stepping down on R.  Today continued to focus on eccentric R quad strengthening, then manual therapy to improve R knee flexion, after manual was able to flex knee to 120 deg meeting LTG #2.  [name] continues to demonstrate potential for improvement and would benefit from continued skilled therapy to address impairments.     OBJECTIVE IMPAIRMENTS: Abnormal gait, decreased activity tolerance, decreased balance, decreased endurance, decreased mobility, difficulty walking, decreased ROM, decreased strength, increased edema, increased fascial restrictions, increased muscle spasms, and pain.   ACTIVITY LIMITATIONS: carrying, lifting, bending, sitting, standing, squatting, sleeping, stairs, transfers, bed mobility, and locomotion level  PARTICIPATION LIMITATIONS: meal prep, cleaning, laundry, driving, shopping, community activity, and occupation  PERSONAL FACTORS: Age, Past/current experiences, and 3+ comorbidities: T2DM, CKD, HTN, Hypothyroidism, GERD, Bradycardia, history neck and back pain  are also affecting patient's functional outcome.   REHAB POTENTIAL: Good  CLINICAL DECISION MAKING: Evolving/moderate complexity  EVALUATION COMPLEXITY: Moderate   GOALS: Goals reviewed with patient? Yes   SHORT TERM GOALS: Target date: 03/17/2023    Independent with initial HEP. Baseline: reviewed Goal status: MET 03/06/23- reviewed 03/13/23- partial compliance.  04/03/23- met   LONG TERM GOALS: Target date: 04/14/2023 extended to 05/05/23  Independent with advanced/ongoing HEP to improve outcomes and carryover.  Baseline:  Goal status: IN PROGRESS 04/03/23- updated HEP  2.  [name] will demonstrate R knee flexion to 120 deg to ascend/descend stairs. Baseline: 75 Goal status: MET 03/06/23- 84 03/13/23- 102 03/30/23- 115 04/19/23 - 120  3.  [name] will demonstrate full R knee extension for safety with  gait. Baseline: lacking 15 Goal status: IN PROGRESS 03/13/23- lacking 8 03/30/23- lacking 4  4.  [name] will be able to ambulate 600' safely with LRAD and normal gait pattern to access community.  Baseline: slow antalgic with RW Goal status: IN PROGRESS 03/13/23- improved heel strike and step length, RW.  03/30/23- using SPC, and short distances without AD.  04/03/23- 600' without AD, but antalgic, decreased stance time on R, trendelenberg gait.   5.  [name] will be able to ascend/descend stairs with 1 HR and reciprocal step pattern safely to access home and community.  Baseline: unable Goal status: IN PROGRESS 03/30/23- able to ascend/descend with reciprocal step and HR, but poor eccentric control on R descending.   6.  MONTAVIUS WILKS will demonstrate > 19/24 on DGI to demonstrate decreased risk of falls.   Baseline: NT Goal status: MET 04/03/23 -21/24  7.  [name] will demonstrate improved functional LE strength by completing 5x STS in <20 seconds.   Baseline: 25 sec to complete 1 sit to stand Goal status: MET 03/30/23- 24 seconds to complete 5x STS without UE assist.  04/10/23- 17 seconds.   8.  DUSTIN GOETTE will demonstrate 30/80 on LEFS to demonstrate improved mobility.  Baseline: 0/80 Goal status: IN PROGRESS 04/03/23- 28/80  PLAN:  PT FREQUENCY: 2x/week  PT DURATION: 4 weeks  PLANNED INTERVENTIONS: Therapeutic exercises, Therapeutic activity, Neuromuscular re-education, Balance training, Gait training, Patient/Family education, Self Care, Joint mobilization, Stair  training, Orthotic/Fit training, Electrical stimulation, Cryotherapy, Moist heat, Taping, Vasopneumatic device, Manual therapy, and Re-evaluation  PLAN FOR NEXT SESSION: progress exercises, ROM and gait training and balance.  Manual therapy and modalities PRN.    Jena Gauss, PT, DPT  04/19/2023, 5:47 PM

## 2023-04-25 ENCOUNTER — Encounter: Payer: Self-pay | Admitting: Internal Medicine

## 2023-04-25 ENCOUNTER — Ambulatory Visit (INDEPENDENT_AMBULATORY_CARE_PROVIDER_SITE_OTHER): Payer: BC Managed Care – PPO | Admitting: Internal Medicine

## 2023-04-25 VITALS — BP 136/72 | HR 63 | Temp 98.5°F | Ht 69.0 in | Wt 198.0 lb

## 2023-04-25 DIAGNOSIS — N1832 Chronic kidney disease, stage 3b: Secondary | ICD-10-CM | POA: Diagnosis not present

## 2023-04-25 DIAGNOSIS — D508 Other iron deficiency anemias: Secondary | ICD-10-CM | POA: Diagnosis not present

## 2023-04-25 DIAGNOSIS — B351 Tinea unguium: Secondary | ICD-10-CM | POA: Diagnosis not present

## 2023-04-25 DIAGNOSIS — K2100 Gastro-esophageal reflux disease with esophagitis, without bleeding: Secondary | ICD-10-CM

## 2023-04-25 LAB — IBC + FERRITIN
Ferritin: 50.3 ng/mL (ref 22.0–322.0)
Iron: 52 ug/dL (ref 42–165)
Saturation Ratios: 16.4 % — ABNORMAL LOW (ref 20.0–50.0)
TIBC: 316.4 ug/dL (ref 250.0–450.0)
Transferrin: 226 mg/dL (ref 212.0–360.0)

## 2023-04-25 LAB — BASIC METABOLIC PANEL
BUN: 16 mg/dL (ref 6–23)
CO2: 24 mEq/L (ref 19–32)
Calcium: 9.1 mg/dL (ref 8.4–10.5)
Chloride: 103 mEq/L (ref 96–112)
Creatinine, Ser: 1.33 mg/dL (ref 0.40–1.50)
GFR: 51.6 mL/min — ABNORMAL LOW (ref >60.00)
Glucose, Bld: 94 mg/dL (ref 70–99)
Potassium: 3.5 mEq/L (ref 3.5–5.1)
Sodium: 137 mEq/L (ref 135–145)

## 2023-04-25 LAB — CBC WITH DIFFERENTIAL/PLATELET
Basophils Absolute: 0.1 10*3/uL (ref 0.0–0.1)
Basophils Relative: 0.9 % (ref 0.0–3.0)
Eosinophils Absolute: 0.1 10*3/uL (ref 0.0–0.7)
Eosinophils Relative: 1.8 % (ref 0.0–5.0)
HCT: 32.7 % — ABNORMAL LOW (ref 39.0–52.0)
Hemoglobin: 10.8 g/dL — ABNORMAL LOW (ref 13.0–17.0)
Lymphocytes Relative: 13.4 % (ref 12.0–46.0)
Lymphs Abs: 1 10*3/uL (ref 0.7–4.0)
MCHC: 33 g/dL (ref 30.0–36.0)
MCV: 89.7 fl (ref 78.0–100.0)
Monocytes Absolute: 0.7 10*3/uL (ref 0.1–1.0)
Monocytes Relative: 10.4 % (ref 3.0–12.0)
Neutro Abs: 5.2 10*3/uL (ref 1.4–7.7)
Neutrophils Relative %: 73.5 % (ref 43.0–77.0)
Platelets: 290 10*3/uL (ref 150.0–400.0)
RBC: 3.65 Mil/uL — ABNORMAL LOW (ref 4.22–5.81)
RDW: 17.2 % — ABNORMAL HIGH (ref 11.5–15.5)
WBC: 7.1 10*3/uL (ref 4.0–10.5)

## 2023-04-25 MED ORDER — OMEPRAZOLE 40 MG PO CPDR: 40.0000 mg | DELAYED_RELEASE_CAPSULE | Freq: Every day | ORAL | 1 refills | Status: DC

## 2023-04-25 NOTE — Progress Notes (Signed)
Subjective:  Patient ID: Ronald Bond, male    DOB: January 31, 1946  Age: 77 y.o. MRN: [id number]  CC: Anemia and Gastroesophageal Reflux   HPI Ronald Bond presents for f/up ---  In the last few days he has had some dark stools and is concerned it may have been melena.  He denies abdominal pain, nausea, vomiting, diarrhea, or bright red blood per rectum.  He complains of chronic fatigue.  Outpatient Medications Prior to Visit  Medication Sig Dispense Refill   albuterol (VENTOLIN HFA) 108 (90 Base) MCG/ACT inhaler Inhale 2 puffs into the lungs every 6 (six) hours as needed for wheezing or shortness of breath. 18 g 3   apixaban (ELIQUIS) 2.5 MG TABS tablet Take 1 tablet (2.5 mg total) by mouth 2 (two) times daily. 60 tablet 0   atorvastatin (LIPITOR) 40 MG tablet TAKE 1 TABLET BY MOUTH EVERY DAY 90 tablet 1   Blood Pressure Monitoring (BLOOD PRESSURE MONITOR/ARM) DEVI 1 Device by Does not apply route daily. USE DAILY TO  TAKE BLOOD PRESSURE READINGS 1 each 0   cetirizine (ZYRTEC) 10 MG tablet Take 10 mg by mouth daily.     cholecalciferol (VITAMIN D3) 25 MCG (1000 UNIT) tablet Take 1,000 Units by mouth daily.     docusate sodium (COLACE) 100 MG capsule Take 100 mg by mouth 2 (two) times daily.     FARXIGA 10 MG TABS tablet TAKE 1 TABLET BY MOUTH EVERY DAY BEFORE BREAKFAST 90 tablet 1   ferrous sulfate 220 (44 Fe) MG/5ML solution Take 5 mLs (220 mg total) by mouth daily. 450 mL 0   Finerenone (KERENDIA) 20 MG TABS Take 1 tablet (20 mg total) by mouth daily. 90 tablet 0   fluticasone (FLONASE) 50 MCG/ACT nasal spray Place 2 sprays into both nostrils daily. 48 g 1   hydrALAZINE (APRESOLINE) 25 MG tablet TAKE 1 TABLET BY MOUTH THREE TIMES A DAY 270 tablet 0   levothyroxine (SYNTHROID) 112 MCG tablet Take 1 tablet (112 mcg total) by mouth daily. 90 tablet 3   metoprolol tartrate (LOPRESSOR) 25 MG tablet TAKE 0.5 TABLETS BY MOUTH 2 TIMES DAILY. 90 tablet 1   Polyethyl Glycol-Propyl Glycol  0.4-0.3 % SOLN Place 1 drop into both eyes as needed (dry eyes).     tamsulosin (FLOMAX) 0.4 MG CAPS capsule Take 1 capsule (0.4 mg total) by mouth daily after breakfast. 90 capsule 1   TURMERIC CURCUMIN PO Take 1 tablet by mouth daily.     valsartan (DIOVAN) 320 MG tablet TAKE 1 TABLET BY MOUTH EVERY DAY 90 tablet 0   omeprazole (PRILOSEC) 40 MG capsule Take 1 capsule (40 mg total) by mouth daily. 90 capsule 1   No facility-administered medications prior to visit.    ROS Review of Systems  Constitutional:  Positive for fatigue. Negative for appetite change, chills, diaphoresis and unexpected weight change.  HENT: Negative.    Eyes: Negative.   Respiratory:  Negative for cough, chest tightness, shortness of breath and wheezing.   Cardiovascular:  Negative for chest pain, palpitations and leg swelling.  Gastrointestinal:  Negative for abdominal pain, blood in stool, constipation, diarrhea, nausea and vomiting.  Endocrine: Negative.   Genitourinary: Negative.  Negative for difficulty urinating.  Musculoskeletal: Negative.  Negative for arthralgias, joint swelling and myalgias.  Skin: Negative.  Negative for color change and rash.  Neurological:  Negative for dizziness, weakness and light-headedness.  Hematological:  Negative for adenopathy. Does not bruise/bleed easily.  Psychiatric/Behavioral: Negative.  Objective:  BP 136/72 (BP Location: Left Arm, Patient Position: Sitting, Cuff Size: Large)   Pulse 63   Temp 98.5 F (36.9 C) (Oral)   Ht 5\' 9"  (1.753 m)   Wt 198 lb (89.8 kg)   SpO2 97%   BMI 29.24 kg/m   BP Readings from Last 3 Encounters:  04/25/23 136/72  03/30/23 (!) 120/58  03/01/23 (!) 168/76    Wt Readings from Last 3 Encounters:  04/25/23 198 lb (89.8 kg)  03/30/23 201 lb (91.2 kg)  02/27/23 207 lb 3.7 oz (94 kg)    Physical Exam Vitals reviewed. Exam conducted with a chaperone present Malva Limes).  HENT:     Mouth/Throat:     Mouth: Mucous membranes  are moist.  Eyes:     General: No scleral icterus.    Conjunctiva/sclera: Conjunctivae normal.  Cardiovascular:     Rate and Rhythm: Normal rate and regular rhythm.     Heart sounds: No murmur heard.    No gallop.  Pulmonary:     Effort: Pulmonary effort is normal.     Breath sounds: No stridor. No wheezing, rhonchi or rales.  Abdominal:     General: Abdomen is flat.     Palpations: There is no mass.     Tenderness: There is no abdominal tenderness. There is no guarding.     Hernia: No hernia is present.  Genitourinary:    Penis: Normal and circumcised.      Testes: Normal.     Prostate: Normal. Not enlarged, not tender and no nodules present.     Rectum: Normal. Guaiac result negative. No mass, tenderness, anal fissure, external hemorrhoid or internal hemorrhoid. Normal anal tone.  Musculoskeletal:        General: Normal range of motion.     Cervical back: Neck supple.     Right lower leg: No edema.     Left lower leg: No edema.  Lymphadenopathy:     Cervical: No cervical adenopathy.  Skin:    General: Skin is warm and dry.     Coloration: Skin is not pale.  Neurological:     General: No focal deficit present.     Mental Status: He is alert. Mental status is at baseline.  Psychiatric:        Mood and Affect: Mood normal.        Behavior: Behavior normal.     Lab Results  Component Value Date   WBC 7.1 04/25/2023   HGB 10.8 (L) 04/25/2023   HCT 32.7 (L) 04/25/2023   PLT 290.0 04/25/2023   GLUCOSE 94 04/25/2023   CHOL 159 06/06/2022   TRIG 90.0 06/06/2022   HDL 56.60 06/06/2022   LDLDIRECT 75.0 03/08/2016   LDLCALC 84 06/06/2022   ALT 13 02/16/2023   AST 15 02/16/2023   NA 137 04/25/2023   K 3.5 04/25/2023   CL 103 04/25/2023   CREATININE 1.33 04/25/2023   BUN 16 04/25/2023   CO2 24 04/25/2023   TSH 5.93 (H) 03/30/2023   PSA 3.58 12/28/2022   INR 0.9 10/17/2007   HGBA1C 6.2 03/30/2023   MICROALBUR 4.6 (H) 12/28/2022    DG Chest 1 View  Result Date:  02/28/2023 CLINICAL DATA:  Chest pain EXAM: CHEST  1 VIEW COMPARISON:  CT 09/28/2016.  X-ray 02/04/2019 FINDINGS: No consolidation, pneumothorax or effusion. No edema. Normal cardiopericardial silhouette. Degenerative changes of the spine. Slightly elevated right hemidiaphragm IMPRESSION: No acute cardiopulmonary disease Electronically Signed   By: Scarlette Shorts  Chales Abrahams M.D.   On: 02/28/2023 14:29   DG Knee Right Port  Result Date: 02/27/2023 CLINICAL DATA:  Postop right knee EXAM: PORTABLE RIGHT KNEE - 2 VIEW COMPARISON:  Right knee radiograph dated December 06, 2013 FINDINGS: Interval postsurgical changes from right total knee arthroplasty. Arthroplasty components appear in their expected alignment. No periprosthetic fracture is identified. Expected postoperative changes within the overlying soft tissues. IMPRESSION: Status post right total knee arthroplasty. Electronically Signed   By: Allegra Lai M.D.   On: 02/27/2023 15:14    Assessment & Plan:   Stage 3b chronic kidney disease (HCC)- His renal function is stable.  Will avoid nephrotoxic agents. -     Basic metabolic panel; Future  Gastroesophageal reflux disease with esophagitis without hemorrhage- His H&H have improved.  I do not think he has intestinal bleeding. -     CBC with Differential/Platelet; Future -     Omeprazole; Take 1 capsule (40 mg total) by mouth daily.  Dispense: 90 capsule; Refill: 1  Iron deficiency anemia secondary to inadequate dietary iron intake -     IBC + Ferritin; Future -     CBC with Differential/Platelet; Future  Onychomycosis of left great toe -     Ambulatory referral to Podiatry     Follow-up: Return in about 4 months (around 08/26/2023).  Sanda Linger, MD

## 2023-04-25 NOTE — Patient Instructions (Signed)
Iron Deficiency Anemia, Adult  Iron deficiency anemia is a condition in which the concentration of red blood cells or hemoglobin in the blood is below normal because of too little iron. Hemoglobin is a substance in red blood cells that carries oxygen to the body's tissues. When the concentration of red blood cells or hemoglobin is too low, not enough oxygen reaches these tissues. Iron deficiency anemia is usually long-lasting, and it develops over time. It may or may not cause symptoms. It is a common type of anemia. What are the causes? This condition may be caused by: Not enough iron in the diet. Abnormal absorption in the gut. Blood loss. What increases the risk? You are more likely to develop this condition if you get menstrual periods (menstruate) or are pregnant. What are the signs or symptoms? Symptoms of this condition may include: Pale skin, lips, and nail beds. Weakness, dizziness, and getting tired easily. Shortness of breath when moving or exercising. Cold hands or feet. Mild anemia may not cause any symptoms. How is this diagnosed? This condition is diagnosed based on: Your medical history. A physical exam. Blood tests. How is this treated? This condition is treated by correcting the cause of your iron deficiency. Treatment may involve: Adding iron-rich foods to your diet. Taking iron supplements. If you are pregnant or breastfeeding, you may need to take extra iron because your normal diet usually does not provide the amount of iron that you need. Increasing vitamin C intake. Vitamin C helps your body absorb iron. Your health care provider may recommend that you take iron supplements along with a glass of orange juice or a vitamin C supplement. Medicines to make heavy menstrual flow lighter. Surgery or additional testing procedures to determine the cause of your anemia. You may need repeat blood tests to determine whether treatment is working. If the treatment does not  seem to be working, you may need more tests. Follow these instructions at home: Medicines Take over-the-counter and prescription medicines only as told by your health care provider. This includes iron supplements and vitamins. This is important because too much iron can be harmful. For the best iron absorption, you should take iron supplements when your stomach is empty. If you cannot tolerate them on an empty stomach, you may need to take them with food. Do not drink milk or take antacids at the same time as your iron supplements. Milk and antacids may interfere with how your body absorbs iron. Iron supplements may turn stool (feces) a darker color and it may appear black. If you cannot tolerate taking iron supplements by mouth, talk with your health care provider about taking them through an IV or through an injection into a muscle. Eating and drinking Talk with your health care provider before changing your diet. Your provider may recommend that you eat foods that contain a lot of iron, such as: Liver. Low-fat (lean) beef. Breads and cereals that have iron added to them (are fortified). Eggs. Dried fruit. Dark green, leafy vegetables. To help your body use the iron from iron-rich foods, eat those foods at the same time as fresh fruits and vegetables that are high in vitamin C. Foods that are high in vitamin C include: Oranges. Peppers. Tomatoes. Mangoes. Managing constipation If you are taking an iron supplement, it may cause constipation. To prevent or treat constipation, you may need to: Drink enough fluid to keep your urine pale yellow. Take over-the-counter or prescription medicines. Eat foods that are high in fiber, such   as beans, whole grains, and fresh fruits and vegetables. Limit foods that are high in fat and processed sugars, such as fried or sweet foods. General instructions Return to your normal activities as told by your health care provider. Ask your health care provider  what activities are safe for you. Keep all follow-up visits. Contact a health care provider if: You feel nauseous or you vomit. You feel weak. You become light-headed when getting up from a sitting or lying down position. You have unexplained sweating. You develop symptoms of constipation. You have a heaviness in your chest. You have trouble breathing with physical activity. Get help right away if: You faint. If this happens, do not drive yourself to the hospital. You have an irregular or rapid heartbeat. Summary Iron deficiency anemia is a condition in which the concentration of red blood cells or hemoglobin in the blood is below normal because of too little iron. This condition is treated by correcting the cause of your iron deficiency. Take over-the-counter and prescription medicines only as told by your health care provider. This includes iron supplements and vitamins. To help your body use the iron from iron-rich foods, eat those foods at the same time as fresh fruits and vegetables that are high in vitamin C. Seek medical help if you have signs or symptoms of worsening anemia. This information is not intended to replace advice given to you by your health care provider. Make sure you discuss any questions you have with your health care provider. Document Revised: 12/22/2021 Document Reviewed: 12/22/2021 Elsevier Patient Education  2024 [employer] Inc.  

## 2023-04-26 ENCOUNTER — Ambulatory Visit: Payer: BC Managed Care – PPO

## 2023-04-26 DIAGNOSIS — R600 Localized edema: Secondary | ICD-10-CM

## 2023-04-26 DIAGNOSIS — M6281 Muscle weakness (generalized): Secondary | ICD-10-CM

## 2023-04-26 DIAGNOSIS — M25561 Pain in right knee: Secondary | ICD-10-CM

## 2023-04-26 DIAGNOSIS — R262 Difficulty in walking, not elsewhere classified: Secondary | ICD-10-CM

## 2023-04-26 DIAGNOSIS — M25661 Stiffness of right knee, not elsewhere classified: Secondary | ICD-10-CM

## 2023-04-26 NOTE — Therapy (Addendum)
OUTPATIENT PHYSICAL THERAPY TREATMENT       Patient Name: Ronald Bond MRN: [id number] DOB:1945-12-17, 77 y.o., male Today's Date: 04/26/2023  END OF SESSION:  PT End of Session - 04/26/23 1451     Visit Number 15    Number of Visits 16    Date for PT Re-Evaluation 05/05/23    Authorization Type BCBS, Medicare & Devoted    Progress Note Due on Visit 16    PT Start Time 1447    PT Stop Time 1529    PT Time Calculation (min) 42 min    Activity Tolerance Patient tolerated treatment well    Behavior During Therapy WFL for tasks assessed/performed                   Past Medical History:  Diagnosis Date   Anemia    Chronic kidney disease    GERD (gastroesophageal reflux disease)    HTN (hypertension)    Hyperlipidemia    Hypothyroidism    Osteoarthritis    Pneumonia    Pre-diabetes    Sleep apnea    no CPAP   Wears glasses    Past Surgical History:  Procedure Laterality Date   COLONOSCOPY     EYE SURGERY  2006   both cataracts   MICROLARYNGOSCOPY Left 11/18/2013   Procedure: MICROLARYNGOSCOPY WITH REMOVAL OF GRANULOMA LEFT SIDE;  Surgeon: Serena Colonel, MD;  [facility]:  [facility];  Service: ENT;  Laterality: Left;   NM MYOVIEW LTD  06/2007   Low risk, normal.  No evidence of ischemia or infarction   THYROIDECTOMY  1974   TONSILLECTOMY     as a child   TOTAL KNEE ARTHROPLASTY  2009   left   TOTAL KNEE ARTHROPLASTY Right 02/27/2023   Procedure: TOTAL KNEE ARTHROPLASTY;  Surgeon: Joen Laura, MD;  Location: [facility];  Service: Orthopedics;  Laterality: Right;   TOTAL KNEE REVISION Left 07/06/2022   Procedure: TOTAL KNEE REVISION;  Surgeon: Joen Laura, MD;  Location: [facility];  Service: Orthopedics;  Laterality: Left;   Patient Active Problem List   Diagnosis Date Noted   Onychomycosis of left great toe 04/25/2023   CKD stage 3 due to type 2 diabetes mellitus (HCC) 03/30/2023   PAC (premature atrial contraction) 12/27/2021    Encounter for general adult medical examination with abnormal findings 06/22/2021   Bradycardia 11/30/2020   Graves' orbitopathy 07/13/2020   Type II diabetes mellitus with manifestations (HCC) 05/13/2020   Graves disease 10/07/2019   Postablative hypothyroidism 10/07/2019   Thyroid-related proptosis 08/15/2019   Chronic renal disease, stage 3, moderately decreased glomerular filtration rate (GFR) between 30-59 mL/min/1.73 square meter (HCC) 08/15/2019   Long-term current use of opiate analgesic 05/14/2018   Bilateral epiphora 11/16/2017   Thiamine deficiency 06/08/2017   Obesity (BMI 30.0-34.9) 06/04/2017   Hypertensive heart disease without CHF (congestive heart failure) 12/25/2016   Right cervical radiculopathy 06/16/2016   Iron deficiency anemia 03/08/2016   Spinal stenosis in cervical region 08/25/2015   Allergic eczema 08/07/2014   Obstructive sleep apnea 11/12/2013   Mild intermittent asthma 06/03/2013   Herniated lumbar disc without myelopathy 10/18/2012   Allergic rhinitis 03/10/2011   Cardiomegaly 10/18/2010   ERECTILE DYSFUNCTION, NON-ORGANIC, MILD 06/29/2009   Hyperlipidemia associated with type 2 diabetes mellitus (HCC) 05/28/2009   Essential hypertension, benign 05/28/2009   GERD 05/28/2009   BPH associated with nocturia 05/28/2009   Primary osteoarthritis of right knee 05/28/2009    PCP: Yetta Barre,  Bernadene Bell, MD  REFERRING PROVIDER: Joen Laura, MD   REFERRING DIAG: s/p R TKA  THERAPY DIAG:  Stiffness of right knee, not elsewhere classified  Acute pain of right knee  Localized edema  Muscle weakness (generalized)  Difficulty in walking, not elsewhere classified  Rationale for Evaluation and Treatment: Rehabilitation  ONSET DATE: 02/27/23  SUBJECTIVE:   SUBJECTIVE STATEMENT: Pt feels a "deep ache" in his R knee.  NEXT MD VISIT: 04/11/23  PERTINENT HISTORY: T2DM, CKD, HTN, Hypothyroidism, GERD, Bradycardia, history neck and back pain.  PAIN:   Are you having pain? Yes: NPRS scale: 6/10 Pain location: R knee Pain description: deep ache  PRECAUTIONS: None  WEIGHT BEARING RESTRICTIONS: No  FALLS:  Has patient fallen in last 6 months? 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 Arthroplasty components appear in their expected alignment. No periprosthetic fracture is identified. Expected postoperative changes within the overlying soft tissues.  PATIENT SURVEYS:  LEFS 0/80  COGNITION: Overall cognitive status: Within functional limits for tasks assessed     SENSATION: WFL  EDEMA:  Circumferential: right 49 cm; left 47.5  cm  MUSCLE LENGTH: NT  POSTURE: No Significant postural limitations  PALPATION: Tender calf but no swelling, warmth or redness.  No bleeding under bandage.  Incision covered by hydrocolloid dressing.    LOWER EXTREMITY ROM:  Passive ROM Right eval Left eval Right 03/09/23 Right 03/13/23 Right 4/22/2 AROM Right 03/23/23 AROM Right 03/30/23 PROM Right 04/10/23 Right 5/22/242  Knee flexion 75 120 86 102 95 101 Pre: 110, Post 115 115 120 PROM  Knee extension Lacking 15 0 10 8 5 5  Pre: 5,  Post: 4 3    (Blank rows = not tested)  LOWER EXTREMITY MMT:  MMT Right eval Left eval Right 03/30/23  Hip flexion 2+ 4+ 4+  Hip extension   4+  Hip abduction 5 5 5   Hip adduction 5 5 5   Knee flexion 3 5 4+  Knee  extension 2+ 5 4+  Ankle dorsiflexion 4+ 5 5  Ankle plantarflexion      (Blank rows = not tested)  LOWER EXTREMITY SPECIAL TESTS:    FUNCTIONAL TESTS:  30 seconds chair stand test 1 rep. Gait speed 0.62m/s  GAIT: Distance walked: 50' Assistive device utilized: Environmental consultant - 2 wheeled Level of assistance: Modified independence Comments: R knee flexion throughout stance phase, decreased weight shift to R, decreased gait speed   TODAY'S TREATMENT:  DATE: 04/26/23 Therapeutic Exercise: to improve strength and mobility.  Demo, verbal and tactile cues throughout for technique. Bike L3 x 6 min seat 7 Staggered sit to stand x 10  Single leg RDL R x 10 with 1HA; 5x with no UE support  Step downs 6' L LE 2x10 Leg press 25# RLE 2x10 Knee extension 20# BLE x 10; 10# RLE  Manual Therapy: to decrease muscle spasm and pain and improve mobility STM to R lateral hamstrings Passive hamstring stretch    04/19/23 Therapeutic Exercise: to improve strength and mobility.  Demo, verbal and tactile cues throughout for technique. Bike L3 x 6 min seat 7 Steps downs (heel tap) 2 x 10 bil 6" Knee extension 25# 2 x 10, 30# 1 x 10  bil conc, RLE eccentric phase Knee flexion 25# 2 x 10 bil  Squats x 10 Prone bent knee raises x 10 - hamstring cramp Prone leg extensions x 10  Supine bridges x 10  Manual Therapy: to decrease muscle spasm and pain and improve mobility Prone with R hip hyperextended prone knee flexion stretch with rotational movements to relax glutes and contract relax to relax quads, to stretch proximal rectus femoris.  Supine R knee flexion with distraction, PA mob and IR of tibia.  04/12/23 Bike L2x85min seat 8 Fwd eccentric step down RLE 2x10 6' step Lateral step down with L heel tap 6' step x 10 Leg press 25# BLE 2x10; 15# RLE x 12 Knee flexion 25# 2x10 BLE; 10# RLE x  10 Knee extension 15# BLE 2x10; 5# 2x10 RLE  04/10/23  Bike L2 x 6 min (seat 9) - had to rock initially Gait x 1200' Stairs x 14 with reciprocal steps Step downs - starting 6" x 10 RLE, progressing to 8"  x 10 RLE - for eccentric strengthening.  5x STS  Manual Therapy: to decrease muscle spasm and pain and improve mobility STM/TPR to R quads and hamstrings, skilled palpation and monitoring during dry needling. Trigger Point Dry-Needling  Treatment instructions: Expect mild to moderate muscle soreness. S/S of pneumothorax if dry needled over a lung field, and to seek immediate medical attention should they occur. Patient verbalized understanding of these instructions and education. Patient Consent Given: Yes Education handout provided: Yes Muscles treated: R quads - medialis and lateralis Electrical stimulation performed: No Parameters: N/A Treatment response/outcome: Twitch Response Elicited and Palpable Increase in Muscle Length    PATIENT EDUCATION:  Education details: HEP review and update Person educated: Patient Education method: Explanation, Demonstration, Verbal cues, and Handouts Education comprehension: verbalized understanding and returned demonstration  HOME EXERCISE PROGRAM: Access Code: [email or web address] URL: [email or web address] Date: 04/03/2023 Prepared by: Harrie Foreman  Exercises - Supine Heel Slide  - 3 x daily - 7 x weekly - 2 sets - 10 reps - Supine Quad Set  - 3 x daily - 7 x weekly - 2 sets - 10 reps - 5s hold - Supine Active Straight Leg Raise  - 3 x daily - 7 x weekly - 2 sets - 10 reps - Seated Knee Flexion AAROM  - 3 x daily - 7 x weekly - 2 sets - 10 reps - 5s hold - Seated Long Arc Quad  - 3 x daily - 7 x weekly - 2 sets - 10 reps - 5s hold - Seated Knee Flexion Stretch  - 3 x daily - 7 x weekly - 1 sets - 10 reps - 5 sec  hold - [place]  - 1 x  daily - 7 x weekly - 2 sets - 10 reps - Backward Weight Shift and Opposite Arm Raise with  Walker  - 1 x daily - 7 x weekly - 2 sets - 10 reps - Gastroc Stretch on Wall  - 1 x daily - 7 x weekly - 3 sets - 30 sec hold - Standing Terminal Knee Extension with Resistance  - 2 x daily - 7 x weekly - 2-3 sets - 10 reps - 5 sec  hold - Forward Step Down Touch with Heel  - 1 x daily - 7 x weekly - 2-3 sets - 10 reps - Seated Knee Extension Stretch with Chair  - 2 x daily - 7 x weekly - 1 sets - 1 reps - 3-5 min hold  ASSESSMENT:  CLINICAL IMPRESSION: Pt arrived with no new complaints. Able to continue progressing exercises w/o limitations. He continues to show difficulty with eccentric control on RLE. He has tension and TTP along the lateral hamstrings. After manual he was able to achieve 2 deg of R knee extension. He continues to show good progress and benefit from continued PT.    OBJECTIVE IMPAIRMENTS: Abnormal gait, decreased activity tolerance, decreased balance, decreased endurance, decreased mobility, difficulty walking, decreased ROM, decreased strength, increased edema, increased fascial restrictions, increased muscle spasms, and pain.   ACTIVITY LIMITATIONS: carrying, lifting, bending, sitting, standing, squatting, sleeping, stairs, transfers, bed mobility, and locomotion level  PARTICIPATION LIMITATIONS: meal prep, cleaning, laundry, driving, shopping, community activity, and occupation  PERSONAL FACTORS: Age, Past/current experiences, and 3+ comorbidities: T2DM, CKD, HTN, Hypothyroidism, GERD, Bradycardia, history neck and back pain  are also affecting patient's functional outcome.   REHAB POTENTIAL: Good  CLINICAL DECISION MAKING: Evolving/moderate complexity  EVALUATION COMPLEXITY: Moderate   GOALS: Goals reviewed with patient? Yes   SHORT TERM GOALS: Target date: 03/17/2023    Independent with initial HEP. Baseline: reviewed Goal status: MET 03/06/23- reviewed 03/13/23- partial compliance.  04/03/23- met   LONG TERM GOALS: Target date: 04/14/2023 extended to  05/05/23  Independent with advanced/ongoing HEP to improve outcomes and carryover.  Baseline:  Goal status: IN PROGRESS 04/03/23- updated HEP  2.  [name] will demonstrate R knee flexion to 120 deg to ascend/descend stairs. Baseline: 75 Goal status: MET 03/06/23- 84 03/13/23- 102 03/30/23- 115 04/19/23 - 120  3.  [name] will demonstrate full R knee extension for safety with gait. Baseline: lacking 15 Goal status: IN PROGRESS 03/13/23- lacking 8 03/30/23- lacking 4  4.  [name] will be able to ambulate 600' safely with LRAD and normal gait pattern to access community.  Baseline: slow antalgic with RW Goal status: IN PROGRESS 03/13/23- improved heel strike and step length, RW.  03/30/23- using SPC, and short distances without AD.  04/03/23- 600' without AD, but antalgic, decreased stance time on R, trendelenberg gait.   5.  [name] will be able to ascend/descend stairs with 1 HR and reciprocal step pattern safely to access home and community.  Baseline: unable Goal status: IN PROGRESS 03/30/23- able to ascend/descend with reciprocal step and HR, but poor eccentric control on R descending.   6.  DAVIDJAMES ESPARZA will demonstrate > 19/24 on DGI to demonstrate decreased risk of falls.   Baseline: NT Goal status: MET 04/03/23 -21/24  7.  [name] will demonstrate improved functional LE strength by completing 5x STS in <20 seconds.   Baseline: 25 sec to complete 1 sit to stand Goal status: MET  03/30/23- 24 seconds to complete 5x STS without UE assist.  04/10/23- 17 seconds.   8.  BERTHEL KAPAUN will demonstrate 30/80 on LEFS to demonstrate improved mobility.  Baseline: 0/80 Goal status: IN PROGRESS 04/03/23- 28/80  PLAN:  PT FREQUENCY: 2x/week  PT DURATION: 4 weeks  PLANNED INTERVENTIONS: Therapeutic exercises, Therapeutic activity, Neuromuscular re-education, Balance training, Gait training, Patient/Family education, Self Care, Joint mobilization, Stair training,  Orthotic/Fit training, Electrical stimulation, Cryotherapy, Moist heat, Taping, Vasopneumatic device, Manual therapy, and Re-evaluation  PLAN FOR NEXT SESSION: progress note; progress exercises, ROM and gait training and balance.  Manual therapy and modalities PRN.    Darleene Cleaver, PTA 04/26/2023, 3:33 PM

## 2023-04-27 ENCOUNTER — Ambulatory Visit: Payer: BC Managed Care – PPO

## 2023-04-27 DIAGNOSIS — R600 Localized edema: Secondary | ICD-10-CM

## 2023-04-27 DIAGNOSIS — M25561 Pain in right knee: Secondary | ICD-10-CM | POA: Diagnosis not present

## 2023-04-27 DIAGNOSIS — M25661 Stiffness of right knee, not elsewhere classified: Secondary | ICD-10-CM

## 2023-04-27 DIAGNOSIS — R262 Difficulty in walking, not elsewhere classified: Secondary | ICD-10-CM

## 2023-04-27 DIAGNOSIS — M6281 Muscle weakness (generalized): Secondary | ICD-10-CM

## 2023-04-27 NOTE — Therapy (Addendum)
OUTPATIENT PHYSICAL THERAPY TREATMENT          Patient Name: Ronald Bond MRN: [id number] DOB:Dec 07, 1945, 77 y.o., male Today's Date: 04/27/2023  END OF SESSION:  PT End of Session - 04/27/23 1104     Visit Number 16    Number of Visits 16    Date for PT Re-Evaluation 05/05/23    Authorization Type BCBS, Medicare & Devoted    Progress Note Due on Visit 16    PT Start Time 1101    PT Stop Time 1141    PT Time Calculation (min) 40 min    Activity Tolerance Patient tolerated treatment well    Behavior During Therapy WFL for tasks assessed/performed                   Past Medical History:  Diagnosis Date   Anemia    Chronic kidney disease    GERD (gastroesophageal reflux disease)    HTN (hypertension)    Hyperlipidemia    Hypothyroidism    Osteoarthritis    Pneumonia    Pre-diabetes    Sleep apnea    no CPAP   Wears glasses    Past Surgical History:  Procedure Laterality Date   COLONOSCOPY     EYE SURGERY  2006   both cataracts   MICROLARYNGOSCOPY Left 11/18/2013   Procedure: MICROLARYNGOSCOPY WITH REMOVAL OF GRANULOMA LEFT SIDE;  Surgeon: Serena Colonel, MD;  Location: [facility];  Service: ENT;  Laterality: Left;   NM MYOVIEW LTD  06/2007   Low risk, normal.  No evidence of ischemia or infarction   THYROIDECTOMY  1974   TONSILLECTOMY     as a child   TOTAL KNEE ARTHROPLASTY  2009   left   TOTAL KNEE ARTHROPLASTY Right 02/27/2023   Procedure: TOTAL KNEE ARTHROPLASTY;  Surgeon: Joen Laura, MD;  Location: [facility];  Service: Orthopedics;  Laterality: Right;   TOTAL KNEE REVISION Left 07/06/2022   Procedure: TOTAL KNEE REVISION;  Surgeon: Joen Laura, MD;  Location: [facility];  Service: Orthopedics;  Laterality: Left;   Patient Active Problem List   Diagnosis Date Noted   Onychomycosis of left great toe 04/25/2023   CKD stage 3 due to type 2 diabetes mellitus (HCC) 03/30/2023   PAC (premature atrial contraction) 12/27/2021    Encounter for general adult medical examination with abnormal findings 06/22/2021   Bradycardia 11/30/2020   Graves' orbitopathy 07/13/2020   Type II diabetes mellitus with manifestations (HCC) 05/13/2020   Graves disease 10/07/2019   Postablative hypothyroidism 10/07/2019   Thyroid-related proptosis 08/15/2019   Chronic renal disease, stage 3, moderately decreased glomerular filtration rate (GFR) between 30-59 mL/min/1.73 square meter (HCC) 08/15/2019   Long-term current use of opiate analgesic 05/14/2018   Bilateral epiphora 11/16/2017   Thiamine deficiency 06/08/2017   Obesity (BMI 30.0-34.9) 06/04/2017   Hypertensive heart disease without CHF (congestive heart failure) 12/25/2016   Right cervical radiculopathy 06/16/2016   Iron deficiency anemia 03/08/2016   Spinal stenosis in cervical region 08/25/2015   Allergic eczema 08/07/2014   Obstructive sleep apnea 11/12/2013   Mild intermittent asthma 06/03/2013   Herniated lumbar disc without myelopathy 10/18/2012   Allergic rhinitis 03/10/2011   Cardiomegaly 10/18/2010   ERECTILE DYSFUNCTION, NON-ORGANIC, MILD 06/29/2009   Hyperlipidemia associated with type 2 diabetes mellitus (HCC) 05/28/2009   Essential hypertension, benign 05/28/2009   GERD 05/28/2009   BPH associated with nocturia 05/28/2009   Primary osteoarthritis of right knee 05/28/2009  PCP: [name], MD  REFERRING PROVIDER: Joen Laura, MD   REFERRING DIAG: s/p R TKA  THERAPY DIAG:  Stiffness of right knee, not elsewhere classified  Acute pain of right knee  Localized edema  Muscle weakness (generalized)  Difficulty in walking, not elsewhere classified  Rationale for Evaluation and Treatment: Rehabilitation  ONSET DATE: 02/27/23  SUBJECTIVE:   SUBJECTIVE STATEMENT:  No changes reported.   NEXT MD VISIT: 04/11/23  PERTINENT HISTORY: T2DM, CKD, HTN, Hypothyroidism, GERD, Bradycardia, history neck and back pain.  PAIN:  Are you  having pain? Yes: NPRS scale: 6/10 Pain location: R knee Pain description: deep ache  PRECAUTIONS: None  WEIGHT BEARING RESTRICTIONS: No  FALLS:  Has patient fallen in last 6 months? 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 Arthroplasty components appear in their expected alignment. No periprosthetic fracture is identified. Expected postoperative changes within the overlying soft tissues.  PATIENT SURVEYS:  LEFS 0/80  COGNITION: Overall cognitive status: Within functional limits for tasks assessed     SENSATION: WFL  EDEMA:  Circumferential: right 49 cm; left 47.5  cm  MUSCLE LENGTH: NT  POSTURE: No Significant postural limitations  PALPATION: Tender calf but no swelling, warmth or redness.  No bleeding under bandage.  Incision covered by hydrocolloid dressing.    LOWER EXTREMITY ROM:  Passive ROM Right eval Left eval Right 03/09/23 Right 03/13/23 Right 4/22/2 AROM Right 03/23/23 AROM Right 03/30/23 PROM Right 04/10/23 Right 5/22/242  Knee flexion 75 120 86 102 95 101 Pre: 110, Post 115 115 120 PROM  Knee extension Lacking 15 0 10 8 5 5  Pre: 5,  Post: 4 3    (Blank rows = not tested)  LOWER EXTREMITY MMT:  MMT Right eval Left eval Right 03/30/23  Hip flexion 2+ 4+ 4+  Hip extension   4+  Hip abduction 5 5 5   Hip adduction 5 5 5   Knee flexion 3 5 4+  Knee extension  2+ 5 4+  Ankle dorsiflexion 4+ 5 5  Ankle plantarflexion      (Blank rows = not tested)  LOWER EXTREMITY SPECIAL TESTS:    FUNCTIONAL TESTS:  30 seconds chair stand test 1 rep. Gait speed 0.34m/s  GAIT: Distance walked: 50' Assistive device utilized: Environmental consultant - 2 wheeled Level of assistance: Modified independence Comments: R knee flexion throughout stance phase, decreased weight shift to R, decreased gait speed   TODAY'S TREATMENT:  DATE: 04/27/23 Therapeutic Exercise: to improve strength and mobility.  Demo, verbal and tactile cues throughout for technique. Bike L3 x 6 min seat 7 Reviewed leg press and leg ext/leg curl machines for gym  Therapeutic Activity: to assess functional performance. Re-assessed goals LEFS: 57 / 80 = 71.3 % R knee ROM: 0-115 deg  04/26/23 Therapeutic Exercise: to improve strength and mobility.  Demo, verbal and tactile cues throughout for technique. Bike L3 x 6 min seat 7 Staggered sit to stand x 10  Single leg RDL R x 10 with 1HA; 5x with no UE support  Step downs 6' L LE 2x10 Leg press 25# RLE 2x10 Knee extension 20# BLE x 10; 10# RLE  Manual Therapy: to decrease muscle spasm and pain and improve mobility STM to R lateral hamstrings Passive hamstring stretch    04/19/23 Therapeutic Exercise: to improve strength and mobility.  Demo, verbal and tactile cues throughout for technique. Bike L3 x 6 min seat 7 Steps downs (heel tap) 2 x 10 bil 6" Knee extension 25# 2 x 10, 30# 1 x 10  bil conc, RLE eccentric phase Knee flexion 25# 2 x 10 bil  Squats x 10 Prone bent knee raises x 10 - hamstring cramp Prone leg extensions x 10  Supine bridges x 10  Manual Therapy: to decrease muscle spasm and pain and improve mobility Prone with R hip hyperextended prone knee flexion stretch with rotational movements to relax glutes and contract  relax to relax quads, to stretch proximal rectus femoris.  Supine R knee flexion with distraction, PA mob and IR of tibia.  04/12/23 Bike L2x42min seat 8 Fwd eccentric step down RLE 2x10 6' step Lateral step down with L heel tap 6' step x 10 Leg press 25# BLE 2x10; 15# RLE x 12 Knee flexion 25# 2x10 BLE; 10# RLE x 10 Knee extension 15# BLE 2x10; 5# 2x10 RLE  04/10/23  Bike L2 x 6 min (seat 9) - had to rock initially Gait x 1200' Stairs x 14 with reciprocal steps Step downs - starting 6" x 10 RLE, progressing to 8"  x 10 RLE - for eccentric strengthening.  5x STS  Manual Therapy: to decrease muscle spasm and pain and improve mobility STM/TPR to R quads and hamstrings, skilled palpation and monitoring during dry needling. Trigger Point Dry-Needling  Treatment instructions: Expect mild to moderate muscle soreness. S/S of pneumothorax if dry needled over a lung field, and to seek immediate medical attention should they occur. Patient verbalized understanding of these instructions and education. Patient Consent Given: Yes Education handout provided: Yes Muscles treated: R quads - medialis and lateralis Electrical stimulation performed: No Parameters: N/A Treatment response/outcome: Twitch Response Elicited and Palpable Increase in Muscle Length    PATIENT EDUCATION:  Education details: HEP review and update Person educated: Patient Education method: Explanation, Demonstration, Verbal cues, and Handouts Education comprehension: verbalized understanding and returned demonstration  HOME EXERCISE PROGRAM: Access Code: [email or web address] URL: [email or web address] Date: 04/03/2023 Prepared by: Harrie Foreman  Exercises - Supine Heel Slide  - 3 x daily - 7 x weekly - 2 sets - 10 reps - Supine Quad Set  - 3 x daily - 7 x weekly - 2 sets - 10 reps - 5s hold - Supine Active Straight Leg Raise  - 3 x daily - 7 x weekly - 2 sets - 10 reps - Seated Knee Flexion AAROM  - 3 x daily -  7 x weekly - 2 sets - 10 reps -  5s hold - Seated Long Arc Quad  - 3 x daily - 7 x weekly - 2 sets - 10 reps - 5s hold - Seated Knee Flexion Stretch  - 3 x daily - 7 x weekly - 1 sets - 10 reps - 5 sec  hold - [place]  - 1 x daily - 7 x weekly - 2 sets - 10 reps - Backward Weight Shift and Opposite Arm Raise with Walker  - 1 x daily - 7 x weekly - 2 sets - 10 reps - Gastroc Stretch on Wall  - 1 x daily - 7 x weekly - 3 sets - 30 sec hold - Standing Terminal Knee Extension with Resistance  - 2 x daily - 7 x weekly - 2-3 sets - 10 reps - 5 sec  hold - Forward Step Down Touch with Heel  - 1 x daily - 7 x weekly - 2-3 sets - 10 reps - Seated Knee Extension Stretch with Chair  - 2 x daily - 7 x weekly - 1 sets - 1 reps - 3-5 min hold  ASSESSMENT:  CLINICAL IMPRESSION: Ronald Bond has progressed well with PT. He has met goals for R knee ROM. He is able to ambulate safely within community distances (600'). He is able to navigate stairs reciprocally with good control. LEFS has improved to 57/80. Spent time to review gym equipment  as he is going to start attending the [employer]. As of now he has met all of his LTGs and is pleased with functional progress, he will be placed on 30 day hold.    OBJECTIVE IMPAIRMENTS: Abnormal gait, decreased activity tolerance, decreased balance, decreased endurance, decreased mobility, difficulty walking, decreased ROM, decreased strength, increased edema, increased fascial restrictions, increased muscle spasms, and pain.   ACTIVITY LIMITATIONS: carrying, lifting, bending, sitting, standing, squatting, sleeping, stairs, transfers, bed mobility, and locomotion level  PARTICIPATION LIMITATIONS: meal prep, cleaning, laundry, driving, shopping, community activity, and occupation  PERSONAL FACTORS: Age, Past/current experiences, and 3+ comorbidities: T2DM, CKD, HTN, Hypothyroidism, GERD, Bradycardia, history neck and back pain  are also affecting patient's functional outcome.    REHAB POTENTIAL: Good  CLINICAL DECISION MAKING: Evolving/moderate complexity  EVALUATION COMPLEXITY: Moderate   GOALS: Goals reviewed with patient? Yes   SHORT TERM GOALS: Target date: 03/17/2023    Independent with initial HEP. Baseline: reviewed Goal status: MET 03/06/23- reviewed 03/13/23- partial compliance.  04/03/23- met   LONG TERM GOALS: Target date: 04/14/2023 extended to 05/05/23  Independent with advanced/ongoing HEP to improve outcomes and carryover.  Baseline:  Goal status: MET 04/03/23- updated HEP  2.  Ronald Bond will demonstrate R knee flexion to 120 deg to ascend/descend stairs. Baseline: 75 Goal status: MET 03/06/23- 84 03/13/23- 102 03/30/23- 115 04/19/23 - 120  3.  Ronald Bond will demonstrate full R knee extension for safety with gait. Baseline: lacking 15 Goal status: MET-  04/27/23  4.  Ronald Bond will be able to ambulate 600' safely with LRAD and normal gait pattern to access community.  Baseline: slow antalgic with RW Goal status: MET- 04/27/23   5.  Ronald Bond will be able to ascend/descend stairs with 1 HR and reciprocal step pattern safely to access home and community.  Baseline: unable Goal status: MET 04/27/23   6.  Ronald Bond will demonstrate > 19/24 on DGI to demonstrate decreased risk of falls.   Baseline: NT Goal status: MET 04/03/23 -21/24  7.  Ronald Bond  will demonstrate improved functional LE strength by completing 5x STS in <20 seconds.   Baseline: 25 sec to complete 1 sit to stand Goal status: MET 03/30/23- 24 seconds to complete 5x STS without UE assist.  04/10/23- 17 seconds.   8.  AIMAN NOE will demonstrate 30/80 on LEFS to demonstrate improved mobility.  Baseline: 0/80 Goal status: MET 04/03/23- 28/80  PLAN:  PT FREQUENCY: 2x/week  PT DURATION: 4 weeks  PLANNED INTERVENTIONS: Therapeutic exercises, Therapeutic activity, Neuromuscular re-education, Balance training, Gait training, Patient/Family  education, Self Care, Joint mobilization, Stair training, Orthotic/Fit training, Electrical stimulation, Cryotherapy, Moist heat, Taping, Vasopneumatic device, Manual therapy, and Re-evaluation  PLAN FOR NEXT SESSION: 30 day hold   Ronald Bond, PTA 04/27/2023, 12:06 PM   PHYSICAL THERAPY DISCHARGE SUMMARY  Visits from Start of Care: 16  Current functional level related to goals / functional outcomes: Met all goals, LEFS improved from 0/80 to 57/80, functional knee ROM, strength, and independent gait.    Remaining deficits: Mild R knee pain   Education / Equipment: HEP  Plan: Patient agrees to discharge. Patient is being discharged due to meeting the stated rehab goals.  Ronald Bond was placed on 30 day hold on 04/27/23 due to progress and has not needed to return during stated time frame.    Ronald Bond, PT, DPT  06/26/2023 11:51 AM

## 2023-05-05 ENCOUNTER — Ambulatory Visit: Payer: BC Managed Care – PPO | Admitting: Physical Therapy

## 2023-05-08 ENCOUNTER — Ambulatory Visit (INDEPENDENT_AMBULATORY_CARE_PROVIDER_SITE_OTHER): Payer: No Typology Code available for payment source | Admitting: Podiatry

## 2023-05-08 ENCOUNTER — Encounter: Payer: Self-pay | Admitting: Podiatry

## 2023-05-08 ENCOUNTER — Telehealth: Payer: Self-pay | Admitting: Internal Medicine

## 2023-05-08 VITALS — BP 168/73 | HR 57 | Temp 97.2°F

## 2023-05-08 DIAGNOSIS — E118 Type 2 diabetes mellitus with unspecified complications: Secondary | ICD-10-CM

## 2023-05-08 DIAGNOSIS — M79674 Pain in right toe(s): Secondary | ICD-10-CM

## 2023-05-08 DIAGNOSIS — N1832 Chronic kidney disease, stage 3b: Secondary | ICD-10-CM

## 2023-05-08 DIAGNOSIS — L603 Nail dystrophy: Secondary | ICD-10-CM | POA: Diagnosis not present

## 2023-05-08 DIAGNOSIS — M79675 Pain in left toe(s): Secondary | ICD-10-CM | POA: Diagnosis not present

## 2023-05-08 DIAGNOSIS — B351 Tinea unguium: Secondary | ICD-10-CM

## 2023-05-08 DIAGNOSIS — L608 Other nail disorders: Secondary | ICD-10-CM | POA: Diagnosis not present

## 2023-05-08 MED ORDER — DAPAGLIFLOZIN PROPANEDIOL 10 MG PO TABS
ORAL_TABLET | ORAL | 1 refills | Status: DC
Start: 2023-05-08 — End: 2023-05-19

## 2023-05-08 NOTE — Addendum Note (Signed)
Addended by: Hedy Jacob on: 05/08/2023 10:00 AM   Modules accepted: Orders

## 2023-05-08 NOTE — Progress Notes (Signed)
  Subjective:  Patient ID: Ronald Bond, male    DOB: June 28, 1946,   MRN: [id number]  Chief Complaint  Patient presents with   Nail Problem    Left Foot Hallux fungus. Not sure how long its been there. Notice that it started to have some discoloration. No aggravating factors and nothing for treatment at this time.     78 y.o. male presents for concern as above. concern of thickened elongated and painful nails that are difficult to trim. Requesting to have them trimmed today. Relates burning and tingling in their feet. Patient is diabetic and last A1c was  Lab Results  Component Value Date   HGBA1C 6.2 03/30/2023   .   PCP:  [name], MD     Past Medical History:  Diagnosis Date   Anemia    Chronic kidney disease    GERD (gastroesophageal reflux disease)    HTN (hypertension)    Hyperlipidemia    Hypothyroidism    Osteoarthritis    Pneumonia    Pre-diabetes    Sleep apnea    no CPAP   Wears glasses     Objective:  Physical Exam: Vascular: DP/PT pulses 2/4 bilateral. CFT <3 seconds. Absent hair growth on digits. Edema noted to bilateral lower extremities. Xerosis noted bilaterally.  Skin. No lacerations or abrasions bilateral feet. Nails 1-5 bilateral  are thickened discolored and elongated with subungual debris.  Musculoskeletal: MMT 5/5 bilateral lower extremities in DF, PF, Inversion and Eversion. Deceased ROM in DF of ankle joint.  Neurological: Sensation intact to light touch. Protective sensation intact bilateral.    Assessment:   1. Pain due to onychomycosis of toenails of both feet   2. Type II diabetes mellitus with manifestations (HCC)      Plan:  Patient was evaluated and treated and all questions answered. -Discussed and educated patient on diabetic foot care, especially with  regards to the vascular, neurological and musculoskeletal systems.  -Stressed the importance of good glycemic control and the detriment of not  controlling glucose levels  in relation to the foot. -Discussed supportive shoes at all times and checking feet regularly.  -Mechanically debrided all nails 1-5 bilateral using sterile nail nipper and filed with dremel without incident  -Examined patient -Discussed treatment options for painful dystrophic nails  -Clinical picture and Fungal culture was obtained by removing a portion of the hard nail itself from each of the involved toenails using a sterile nail nipper and sent to [facility] lab. Patient tolerated the biopsy procedure well without discomfort or need for anesthesia.  -Discussed fungal nail treatment options including oral, topical, and laser treatments.  -Answered all patient questions -Patient to return  in 3 months for at risk foot care -Patient advised to call the office if any problems or questions arise in the meantime.   Louann Sjogren, DPM

## 2023-05-08 NOTE — Telephone Encounter (Signed)
Prescription Request  05/08/2023  LOV: 04/25/2023  What is the name of the medication or equipment? FARXIGA 10 MG TABS tablet   Have you contacted your pharmacy to request a refill? No   Which pharmacy would you like this sent to?  [id number] - [place], [place] - [address], [address] AT [facility] [address], [address] [place] [address] Phone: [phone] Fax: [phone]    Patient notified that their request is being sent to the clinical staff for review and that they should receive a response within 2 business days.   Please advise at Mobile [phone] (mobile)

## 2023-05-17 ENCOUNTER — Telehealth: Payer: Self-pay | Admitting: Internal Medicine

## 2023-05-17 DIAGNOSIS — K2100 Gastro-esophageal reflux disease with esophagitis, without bleeding: Secondary | ICD-10-CM

## 2023-05-17 DIAGNOSIS — I10 Essential (primary) hypertension: Secondary | ICD-10-CM

## 2023-05-17 DIAGNOSIS — E1122 Type 2 diabetes mellitus with diabetic chronic kidney disease: Secondary | ICD-10-CM

## 2023-05-17 DIAGNOSIS — N1832 Chronic kidney disease, stage 3b: Secondary | ICD-10-CM

## 2023-05-17 DIAGNOSIS — E118 Type 2 diabetes mellitus with unspecified complications: Secondary | ICD-10-CM

## 2023-05-17 DIAGNOSIS — N401 Enlarged prostate with lower urinary tract symptoms: Secondary | ICD-10-CM

## 2023-05-17 DIAGNOSIS — E785 Hyperlipidemia, unspecified: Secondary | ICD-10-CM

## 2023-05-17 DIAGNOSIS — I119 Hypertensive heart disease without heart failure: Secondary | ICD-10-CM

## 2023-05-17 DIAGNOSIS — D508 Other iron deficiency anemias: Secondary | ICD-10-CM

## 2023-05-17 NOTE — Telephone Encounter (Signed)
They said they had 9 refills on the fax.

## 2023-05-17 NOTE — Telephone Encounter (Signed)
A representative from [facility] called to see if their fax was received for prescription refills. They sent the fax 05/16/2023. Best callback is [phone].

## 2023-05-19 MED ORDER — FERROUS SULFATE 220 (44 FE) MG/5ML PO SOLN
220.0000 mg | Freq: Every day | ORAL | 0 refills | Status: DC
Start: 2023-05-19 — End: 2023-08-03

## 2023-05-19 MED ORDER — OMEPRAZOLE 40 MG PO CPDR
40.0000 mg | DELAYED_RELEASE_CAPSULE | Freq: Every day | ORAL | 0 refills | Status: DC
Start: 2023-05-19 — End: 2023-09-05

## 2023-05-19 MED ORDER — HYDRALAZINE HCL 25 MG PO TABS
25.0000 mg | ORAL_TABLET | Freq: Three times a day (TID) | ORAL | 0 refills | Status: DC
Start: 2023-05-19 — End: 2023-06-22

## 2023-05-19 MED ORDER — KERENDIA 20 MG PO TABS
1.0000 | ORAL_TABLET | Freq: Every day | ORAL | 0 refills | Status: AC
Start: 2023-05-19 — End: ?

## 2023-05-19 MED ORDER — DAPAGLIFLOZIN PROPANEDIOL 10 MG PO TABS
ORAL_TABLET | ORAL | 0 refills | Status: DC
Start: 2023-05-19 — End: 2023-10-07

## 2023-05-19 MED ORDER — VALSARTAN 320 MG PO TABS: 320.0000 mg | ORAL_TABLET | Freq: Every day | ORAL | 0 refills | Status: DC

## 2023-05-19 MED ORDER — ATORVASTATIN CALCIUM 40 MG PO TABS
40.0000 mg | ORAL_TABLET | Freq: Every day | ORAL | 0 refills | Status: DC
Start: 2023-05-19 — End: 2023-08-03

## 2023-05-19 MED ORDER — TAMSULOSIN HCL 0.4 MG PO CAPS
0.4000 mg | ORAL_CAPSULE | Freq: Every day | ORAL | 0 refills | Status: DC
Start: 2023-05-19 — End: 2023-08-03

## 2023-05-23 DIAGNOSIS — M1711 Unilateral primary osteoarthritis, right knee: Secondary | ICD-10-CM | POA: Diagnosis not present

## 2023-05-29 DIAGNOSIS — E1122 Type 2 diabetes mellitus with diabetic chronic kidney disease: Secondary | ICD-10-CM | POA: Diagnosis not present

## 2023-05-29 DIAGNOSIS — Z008 Encounter for other general examination: Secondary | ICD-10-CM | POA: Diagnosis not present

## 2023-06-22 ENCOUNTER — Other Ambulatory Visit: Payer: Self-pay | Admitting: Internal Medicine

## 2023-06-22 DIAGNOSIS — I119 Hypertensive heart disease without heart failure: Secondary | ICD-10-CM

## 2023-06-22 DIAGNOSIS — I10 Essential (primary) hypertension: Secondary | ICD-10-CM

## 2023-06-25 ENCOUNTER — Other Ambulatory Visit: Payer: Self-pay | Admitting: Internal Medicine

## 2023-06-25 DIAGNOSIS — I119 Hypertensive heart disease without heart failure: Secondary | ICD-10-CM

## 2023-06-30 ENCOUNTER — Other Ambulatory Visit: Payer: Self-pay | Admitting: Internal Medicine

## 2023-08-01 ENCOUNTER — Encounter: Payer: Self-pay | Admitting: Internal Medicine

## 2023-08-03 ENCOUNTER — Other Ambulatory Visit: Payer: Self-pay | Admitting: Internal Medicine

## 2023-08-03 DIAGNOSIS — E785 Hyperlipidemia, unspecified: Secondary | ICD-10-CM

## 2023-08-03 DIAGNOSIS — I119 Hypertensive heart disease without heart failure: Secondary | ICD-10-CM

## 2023-08-03 DIAGNOSIS — D508 Other iron deficiency anemias: Secondary | ICD-10-CM

## 2023-08-03 DIAGNOSIS — I10 Essential (primary) hypertension: Secondary | ICD-10-CM

## 2023-08-03 DIAGNOSIS — N401 Enlarged prostate with lower urinary tract symptoms: Secondary | ICD-10-CM

## 2023-08-09 ENCOUNTER — Ambulatory Visit (INDEPENDENT_AMBULATORY_CARE_PROVIDER_SITE_OTHER): Payer: No Typology Code available for payment source | Admitting: Podiatry

## 2023-08-09 DIAGNOSIS — Z91199 Patient's noncompliance with other medical treatment and regimen due to unspecified reason: Secondary | ICD-10-CM

## 2023-08-09 NOTE — Progress Notes (Signed)
No show

## 2023-08-17 ENCOUNTER — Emergency Department (HOSPITAL_COMMUNITY)
Admission: EM | Admit: 2023-08-17 | Discharge: 2023-08-18 | Disposition: A | Discharge status: Home / Self Care | Payer: No Typology Code available for payment source | Attending: Emergency Medicine | Admitting: Emergency Medicine

## 2023-08-17 ENCOUNTER — Encounter (HOSPITAL_COMMUNITY): Payer: Self-pay

## 2023-08-17 ENCOUNTER — Emergency Department (HOSPITAL_COMMUNITY): Payer: No Typology Code available for payment source

## 2023-08-17 ENCOUNTER — Other Ambulatory Visit: Payer: Self-pay

## 2023-08-17 DIAGNOSIS — R260 Ataxic gait: Secondary | ICD-10-CM | POA: Diagnosis not present

## 2023-08-17 DIAGNOSIS — R29818 Other symptoms and signs involving the nervous system: Secondary | ICD-10-CM | POA: Diagnosis not present

## 2023-08-17 DIAGNOSIS — Z79899 Other long term (current) drug therapy: Secondary | ICD-10-CM | POA: Insufficient documentation

## 2023-08-17 DIAGNOSIS — E0590 Thyrotoxicosis, unspecified without thyrotoxic crisis or storm: Secondary | ICD-10-CM | POA: Diagnosis not present

## 2023-08-17 DIAGNOSIS — N189 Chronic kidney disease, unspecified: Secondary | ICD-10-CM | POA: Diagnosis not present

## 2023-08-17 DIAGNOSIS — R519 Headache, unspecified: Secondary | ICD-10-CM | POA: Diagnosis not present

## 2023-08-17 DIAGNOSIS — R030 Elevated blood-pressure reading, without diagnosis of hypertension: Secondary | ICD-10-CM | POA: Diagnosis not present

## 2023-08-17 DIAGNOSIS — I129 Hypertensive chronic kidney disease with stage 1 through stage 4 chronic kidney disease, or unspecified chronic kidney disease: Secondary | ICD-10-CM | POA: Diagnosis not present

## 2023-08-17 DIAGNOSIS — R42 Dizziness and giddiness: Secondary | ICD-10-CM | POA: Diagnosis not present

## 2023-08-17 DIAGNOSIS — I159 Secondary hypertension, unspecified: Secondary | ICD-10-CM

## 2023-08-17 DIAGNOSIS — I10 Essential (primary) hypertension: Secondary | ICD-10-CM | POA: Diagnosis not present

## 2023-08-17 LAB — I-STAT CHEM 8, ED
BUN: 19 mg/dL (ref 8–23)
Calcium, Ion: 1.1 mmol/L — ABNORMAL LOW (ref 1.15–1.40)
Chloride: 103 mmol/L (ref 98–111)
Creatinine, Ser: 1.5 mg/dL — ABNORMAL HIGH (ref 0.61–1.24)
Glucose, Bld: 93 mg/dL (ref 70–99)
HCT: 39 % (ref 39.0–52.0)
Hemoglobin: 13.3 g/dL (ref 13.0–17.0)
Potassium: 3.6 mmol/L (ref 3.5–5.1)
Sodium: 139 mmol/L (ref 135–145)
TCO2: 23 mmol/L (ref 22–32)

## 2023-08-17 LAB — COMPREHENSIVE METABOLIC PANEL
ALT: 18 U/L (ref 0–44)
AST: 20 U/L (ref 15–41)
Albumin: 3.8 g/dL (ref 3.5–5.0)
Alkaline Phosphatase: 54 U/L (ref 38–126)
Anion gap: 10 (ref 5–15)
BUN: 18 mg/dL (ref 8–23)
CO2: 24 mmol/L (ref 22–32)
Calcium: 8.8 mg/dL — ABNORMAL LOW (ref 8.9–10.3)
Chloride: 103 mmol/L (ref 98–111)
Creatinine, Ser: 1.49 mg/dL — ABNORMAL HIGH (ref 0.61–1.24)
GFR, Estimated: 48 mL/min — ABNORMAL LOW (ref >60)
Glucose, Bld: 95 mg/dL (ref 70–99)
Potassium: 3.6 mmol/L (ref 3.5–5.1)
Sodium: 137 mmol/L (ref 135–145)
Total Bilirubin: 0.8 mg/dL (ref 0.3–1.2)
Total Protein: 7.5 g/dL (ref 6.5–8.1)

## 2023-08-17 LAB — PROTIME-INR
INR: 1 (ref 0.8–1.2)
Prothrombin Time: 13.3 seconds (ref 11.4–15.2)

## 2023-08-17 LAB — CBC
HCT: 37.8 % — ABNORMAL LOW (ref 39.0–52.0)
Hemoglobin: 12.2 g/dL — ABNORMAL LOW (ref 13.0–17.0)
MCH: 29.3 pg (ref 26.0–34.0)
MCHC: 32.3 g/dL (ref 30.0–36.0)
MCV: 90.9 fL (ref 80.0–100.0)
Platelets: 242 10*3/uL (ref 150–400)
RBC: 4.16 MIL/uL — ABNORMAL LOW (ref 4.22–5.81)
RDW: 14.8 % (ref 11.5–15.5)
WBC: 6.9 10*3/uL (ref 4.0–10.5)
nRBC: 0 % (ref 0.0–0.2)

## 2023-08-17 LAB — TROPONIN I (HIGH SENSITIVITY)
Troponin I (High Sensitivity): 8 ng/L (ref <18)
Troponin I (High Sensitivity): 8 ng/L (ref <18)

## 2023-08-17 LAB — DIFFERENTIAL
Abs Immature Granulocytes: 0.02 10*3/uL (ref 0.00–0.07)
Basophils Absolute: 0.1 10*3/uL (ref 0.0–0.1)
Basophils Relative: 1 %
Eosinophils Absolute: 0.2 10*3/uL (ref 0.0–0.5)
Eosinophils Relative: 4 %
Immature Granulocytes: 0 %
Lymphocytes Relative: 13 %
Lymphs Abs: 0.9 10*3/uL (ref 0.7–4.0)
Monocytes Absolute: 0.7 10*3/uL (ref 0.1–1.0)
Monocytes Relative: 10 %
Neutro Abs: 4.9 10*3/uL (ref 1.7–7.7)
Neutrophils Relative %: 72 %

## 2023-08-17 LAB — CBG MONITORING, ED: Glucose-Capillary: 116 mg/dL — ABNORMAL HIGH (ref 70–99)

## 2023-08-17 LAB — ETHANOL: Alcohol, Ethyl (B): <10 mg/dL (ref <10)

## 2023-08-17 LAB — TSH: TSH: 18.428 u[IU]/mL — ABNORMAL HIGH (ref 0.350–4.500)

## 2023-08-17 LAB — T4, FREE: Free T4: 0.81 ng/dL (ref 0.61–1.12)

## 2023-08-17 LAB — APTT: aPTT: 28 seconds (ref 24–36)

## 2023-08-17 MED ORDER — TETRACAINE HCL 0.5 % OP SOLN
2.0000 [drp] | Freq: Once | OPHTHALMIC | Status: AC
  Administered 2023-08-17: 2 [drp] via OPHTHALMIC
  Filled 2023-08-17: qty 4

## 2023-08-17 MED ORDER — SODIUM CHLORIDE 0.9% FLUSH: 3.0000 mL | Freq: Once | INTRAVENOUS | Status: DC

## 2023-08-17 NOTE — ED Provider Notes (Signed)
EMERGENCY DEPARTMENT AT [facility] Provider Note   CSN: [id number] Arrival date & time: 08/17/23  1746     History  Chief Complaint  Patient presents with   Hypertension    MELO OSMANI is a 77 y.o. male.  Patient here with dizziness, balance issues, high blood pressure.  History of CKD, hypertension, high cholesterol, Graves' disease.  Patient denies any chest pain shortness of breath weakness numbness tingling otherwise.  He has been having trouble getting around.  Denies any vision loss or speech changes.  Nothing makes it worse or better.  Has had issues with his right eye in the past from thyroid disease.  Follows with ophthalmology.  Nothing seems worse today from his eye standpoint.  The history is provided by the patient.       Home Medications Prior to Admission medications   Medication Sig Start Date End Date Taking? Authorizing Provider  albuterol (VENTOLIN HFA) 108 (90 Base) MCG/ACT inhaler Inhale 2 puffs into the lungs every 6 (six) hours as needed for wheezing or shortness of breath. 07/05/21  Yes [name], MD  atorvastatin (LIPITOR) 40 MG tablet TAKE ONE TABLET (40 MG TOTAL) BY MOUTH DAILY AT 5 PM 08/03/23  Yes [name], MD  cetirizine (ZYRTEC) 10 MG tablet Take 10 mg by mouth daily.   Yes [provider]  cholecalciferol (VITAMIN D3) 25 MCG (1000 UNIT) tablet Take 1,000 Units by mouth daily.   Yes [provider]  dapagliflozin propanediol (FARXIGA) 10 MG TABS tablet TAKE 1 TABLET BY MOUTH EVERY DAY BEFORE BREAKFAST 05/19/23  Yes [name], MD  docusate sodium (COLACE) 100 MG capsule Take 100 mg by mouth 2 (two) times daily.   Yes [provider]  ferrous sulfate 220 (44 Fe) MG/5ML solution TAKE 5 MLS (220 MG TOTAL) BY MOUTH DAILY 08/03/23  Yes [name], MD  Finerenone (KERENDIA) 20 MG TABS Take 1 tablet (20 mg total) by mouth daily. 05/19/23  Yes [name], MD  fluticasone (FLONASE) 50  MCG/ACT nasal spray Place 2 sprays into both nostrils daily. Patient taking differently: Place 2 sprays into both nostrils daily as needed for allergies. 10/15/18  Yes [name], MD  hydrALAZINE (APRESOLINE) 25 MG tablet TAKE ONE TABLET (25 MG TOTAL) BY MOUTH THREE TIMES DAILY @ 9AM-1PM-5PM 08/03/23  Yes [name], MD  levothyroxine (SYNTHROID) 112 MCG tablet Take 1 tablet (112 mcg total) by mouth daily. 01/17/23  Yes Shamleffer, Konrad Dolores, MD  metoprolol tartrate (LOPRESSOR) 25 MG tablet TAKE 0.5 TABLETS BY MOUTH 2 TIMES DAILY. 03/02/23  Yes Cleaver, Thomasene Ripple, NP  omeprazole (PRILOSEC) 40 MG capsule Take 1 capsule (40 mg total) by mouth daily. 05/19/23  Yes [name], MD  Polyethyl Glycol-Propyl Glycol 0.4-0.3 % SOLN Place 1 drop into both eyes as needed (dry eyes).   Yes [provider]  tamsulosin (FLOMAX) 0.4 MG CAPS capsule TAKE ONE CAPSULE (0.4 MG TOTAL) BY MOUTH DAILY AT 9 AM AFTER BREAKFAST Patient taking differently: Take 0.4 mg by mouth in the morning and at bedtime. 08/03/23  Yes [name], MD  TURMERIC CURCUMIN PO Take 1 tablet by mouth daily.   Yes [provider]  valsartan (DIOVAN) 320 MG tablet TAKE ONE TABLET (320 MG TOTAL) BY MOUTH DAILY AT 9 AM 08/03/23  Yes [name], MD  apixaban (ELIQUIS) 2.5 MG TABS tablet Take 1 tablet (2.5 mg total) by mouth 2 (  two) times daily. Patient not taking: Reported on 08/17/2023 02/28/23   Joen Laura, MD  Blood Pressure Monitoring (BLOOD PRESSURE MONITOR/ARM) DEVI 1 Device by Does not apply route daily. USE DAILY TO  TAKE BLOOD PRESSURE READINGS 12/27/21   Marykay Lex, MD      Allergies    Lisinopril    Review of Systems   Review of Systems  Physical Exam Updated Vital Signs BP (!) 193/81   Pulse (!) 48   Temp 97.8 F (36.6 C) (Oral)   Resp 12   Ht 5\' 9"  (1.753 m)   Wt 89.8 kg   SpO2 100%   BMI 29.24 kg/m  Physical Exam Vitals and nursing note reviewed.  Constitutional:       General: He is not in acute distress.    Appearance: He is well-developed. He is not ill-appearing.  HENT:     Head: Normocephalic and atraumatic.     Nose: Nose normal.     Mouth/Throat:     Mouth: Mucous membranes are moist.  Eyes:     Extraocular Movements: Extraocular movements intact.     Conjunctiva/sclera: Conjunctivae normal.     Pupils: Pupils are equal, round, and reactive to light.     Comments: He is got mild proptosis of his right eye compared to his left but he pupils are equal and reactive, eye pressure in the right eye 31 and eye pressure in the left eye 17, extraocular movements are intact  Cardiovascular:     Rate and Rhythm: Normal rate and regular rhythm.     Pulses: Normal pulses.     Heart sounds: Normal heart sounds. No murmur heard. Pulmonary:     Effort: Pulmonary effort is normal. No respiratory distress.     Breath sounds: Normal breath sounds.  Abdominal:     Palpations: Abdomen is soft.     Tenderness: There is no abdominal tenderness.  Musculoskeletal:        General: No swelling. Normal range of motion.     Cervical back: Neck supple.  Skin:    General: Skin is warm and dry.     Capillary Refill: Capillary refill takes less than 2 seconds.  Neurological:     General: No focal deficit present.     Mental Status: He is alert and oriented to person, place, and time.     Cranial Nerves: No cranial nerve deficit.     Sensory: No sensory deficit.     Motor: No weakness.     Comments: 5+ out of 5 strength throughout, normal sensation, no drift, normal finger-to-nose finger, normal speech, little bit uncoordinated with heel-to-shin, visual field appears to be intact  Psychiatric:        Mood and Affect: Mood normal.     ED Results / Procedures / Treatments   Labs (all labs ordered are listed, but only abnormal results are displayed) Labs Reviewed  CBC - Abnormal; Notable for the following components:      Result Value   RBC 4.16 (*)    Hemoglobin  12.2 (*)    HCT 37.8 (*)    All other components within normal limits  COMPREHENSIVE METABOLIC PANEL - Abnormal; Notable for the following components:   Creatinine, Ser 1.49 (*)    Calcium 8.8 (*)    GFR, Estimated 48 (*)    All other components within normal limits  TSH - Abnormal; Notable for the following components:   TSH 18.428 (*)  All other components within normal limits  I-STAT CHEM 8, ED - Abnormal; Notable for the following components:   Creatinine, Ser 1.50 (*)    Calcium, Ion 1.10 (*)    All other components within normal limits  CBG MONITORING, ED - Abnormal; Notable for the following components:   Glucose-Capillary 116 (*)    All other components within normal limits  PROTIME-INR  APTT  DIFFERENTIAL  ETHANOL  T4, FREE  T3, FREE  TROPONIN I (HIGH SENSITIVITY)  TROPONIN I (HIGH SENSITIVITY)    EKG EKG Interpretation Date/Time:  Thursday August 17 2023 17:57:21 EDT Ventricular Rate:  55 PR Interval:  182 QRS Duration:  96 QT Interval:  430 QTC Calculation: 411 R Axis:   -55  Text Interpretation: Sinus bradycardia Left anterior fascicular block Cannot rule out Anterior infarct , age undetermined Abnormal ECG When compared with ECG of 28-Feb-2023 14:06, PREVIOUS ECG IS PRESENT Confirmed by Virgina Norfolk [phone]) on 08/17/2023 8:38:27 PM  Radiology CT HEAD WO CONTRAST  Result Date: 08/17/2023 CLINICAL DATA:  Neuro deficit, acute, stroke suspected EXAM: CT HEAD WITHOUT CONTRAST TECHNIQUE: Contiguous axial images were obtained from the base of the skull through the vertex without intravenous contrast. RADIATION DOSE REDUCTION: This exam was performed according to the departmental dose-optimization program which includes automated exposure control, adjustment of the mA and/or kV according to patient size and/or use of iterative reconstruction technique. COMPARISON:  None Available. FINDINGS: Brain: No hemorrhage. No hydrocephalus. No extra-axial fluid collection.  Possible age indeterminate infarct in the right thalamus. No mass effect. No mass lesion. Vascular: No hyperdense vessel or unexpected calcification. Skull: Normal. Negative for fracture or focal lesion. Sinuses/Orbits: No middle ear or mastoid effusion. Paranasal sinuses are clear. Bilateral lens replacement. There is asymmetric enlargement of the right inferior rectus muscle body (series 4, image 58). There is also mild right-sided proptosis. Other: None. IMPRESSION: 1. Possible age indeterminate infarct in the right thalamus. If there is clinical concern for an acute infarct, consider MRI for further evaluation. 2. Asymmetric enlargement of the right inferior rectus muscle body with mild right-sided proptosis. 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 Recommend correlation with ophthalmologic exam and thyroid labs. Electronically Signed   By: Lorenza Cambridge M.D.   On: 08/17/2023 19:46    Procedures Procedures    Medications Ordered in ED Medications  sodium chloride flush (NS) 0.9 % injection 3 mL (has no administration in time range)  tetracaine (PONTOCAINE) 0.5 % ophthalmic solution 2 drop (2 drops Right Eye Given 08/17/23 2125)    ED Course/ Medical Decision Making/ A&P                                 Medical Decision Making Amount and/or Complexity of Data Reviewed Labs: ordered. Radiology: ordered.  Risk Prescription drug management.   [name] is here with high blood pressure, balance issues, dizziness.  History of high cholesterol, hypertension, CKD.  Patient neurologically overall appears to be intact.  Discussed little bit of slowness with heel-to-shin.  His blood pressure is elevated in the 190 range.  He is not having any chest pain or shortness of breath.  He does have Graves' disease.  Has issues chronically with his right eye where he has a little bit of proptosis.  Based not having any vision loss or ocular pain.  Pressure in his eye was 31 in the right  eye and 17 in the  left.  He used to be on drops in the past but right now he just takes allergy drops.  He takes levothyroxine otherwise.  He denies any weakness numbness tingling otherwise.  Just felt like his balance has not been great.  He is already had blood work and CT scan of his head done prior to my evaluation.  CT scan showed age-indeterminate right thalamic injury and recommend an MRI.  He also was found to have some asymmetric enlargement of his right inferior rectus muscle body with mild right-sided proptosis which they think is likely from thyroid disease.  He states that this is chronic for him as well in the right eye.  I talked with Dr. Zenaida Niece with ophthalmology and overall can follow with tomorrow or with Dr. Dione Booze if he can get into see him.  He follows with him in the past.  Overall we will pursue an MRI of the brain to further evaluate for stroke.  Lab work was otherwise unremarkable.  No significant anemia or electrolyte abnormality or kidney injury.  TSH is 18 however his T4 is normal.  Overall I talked with Dr. Otelia Limes with neurology also agrees to get MRI of the brain.  If MRI is normal he can be discharged with ophthalmology follow-up tomorrow.  If he does have a stroke and he is admitted.  Ophthalmologist Dr. Zenaida Niece does recommend starting him on latanoprost.  Patient handed off to oncoming ED staff with patient pending MRI.  Lab work unremarkable including troponins.  Have no concern for other endorgan damage.  Will likely need some adjustment to his blood pressure medications done outpatient as well.  This chart was dictated using voice recognition software.  Despite best efforts to proofread,  errors can occur which can change the documentation meaning.         Final Clinical Impression(s) / ED Diagnoses Final diagnoses:  Secondary hypertension  Dizziness    Rx / DC Orders ED Discharge Orders     None         Virgina Norfolk, DO 08/17/23 2356

## 2023-08-17 NOTE — Discharge Instructions (Addendum)
It is important that you follow-up with Dr. Dione Booze tomorrow concerning right eye.  If you are unable to follow-up with Dr. Dione Booze please call Dr. Zenaida Niece for an appointment tomorrow.  You are already on a few medicines that affect her blood pressure.  It would be best to follow-up with your primary care doctor for blood pressure management/adjustments to your medications or possibly additional medications.

## 2023-08-17 NOTE — ED Triage Notes (Signed)
Pt c/o "walking off balancex"1wk. Pt c/o HTN 210 systolic earlier today.

## 2023-08-17 NOTE — ED Provider Notes (Signed)
  Provider Note MRN:  [id number]  Arrival date & time: 08/18/23    ED Course and Medical Decision Making  Assumed care from Dr. Lockie Mola at shift change.  Dizziness, balance issues for the past week awaiting MRI to evaluate for stroke.  Hypertensive, also having unilateral eye proptosis, elevated TSH, will follow-up with ophthalmology to start latanoprost drops.  1:45 AM update: Patient doing well on my assessment, no complaints at this time.  MRI is without acute stroke.  Blood pressure seems to be slowly going down, still high but no signs of endorgan damage.  Patient will follow up with ophthalmology, primary care doctor.  Appropriate for discharge.  Procedures  Final Clinical Impressions(s) / ED Diagnoses     ICD-10-CM   1. Dizziness  R42     2. Hypertension, unspecified type  I10            Discharge Instructions      It is important that you follow-up with Dr. Dione Booze tomorrow concerning right eye.  If you are unable to follow-up with Dr. Dione Booze please call Dr. Zenaida Niece for an appointment tomorrow.  You are already on a few medicines that affect her blood pressure.  It would be best to follow-up with your primary care doctor for blood pressure management/adjustments to your medications or possibly additional medications.      Elmer Sow. Pilar Plate, MD [facility] [email or web address]    Sabas Sous, MD 08/18/23 0200

## 2023-08-18 ENCOUNTER — Emergency Department (HOSPITAL_COMMUNITY): Payer: No Typology Code available for payment source

## 2023-08-18 ENCOUNTER — Encounter: Payer: Self-pay | Admitting: Family Medicine

## 2023-08-18 ENCOUNTER — Ambulatory Visit (INDEPENDENT_AMBULATORY_CARE_PROVIDER_SITE_OTHER): Payer: No Typology Code available for payment source | Admitting: Family Medicine

## 2023-08-18 VITALS — BP 138/66 | HR 60 | Temp 98.0°F | Ht 69.0 in | Wt 202.0 lb

## 2023-08-18 DIAGNOSIS — E0500 Thyrotoxicosis with diffuse goiter without thyrotoxic crisis or storm: Secondary | ICD-10-CM

## 2023-08-18 DIAGNOSIS — E1122 Type 2 diabetes mellitus with diabetic chronic kidney disease: Secondary | ICD-10-CM | POA: Diagnosis not present

## 2023-08-18 DIAGNOSIS — N183 Chronic kidney disease, stage 3 unspecified: Secondary | ICD-10-CM

## 2023-08-18 DIAGNOSIS — I119 Hypertensive heart disease without heart failure: Secondary | ICD-10-CM

## 2023-08-18 DIAGNOSIS — R030 Elevated blood-pressure reading, without diagnosis of hypertension: Secondary | ICD-10-CM | POA: Diagnosis not present

## 2023-08-18 DIAGNOSIS — R42 Dizziness and giddiness: Secondary | ICD-10-CM | POA: Diagnosis not present

## 2023-08-18 DIAGNOSIS — E890 Postprocedural hypothyroidism: Secondary | ICD-10-CM | POA: Diagnosis not present

## 2023-08-18 MED ORDER — LEVOTHYROXINE SODIUM 125 MCG PO TABS: 125.0000 ug | ORAL_TABLET | Freq: Every day | ORAL | 1 refills | Status: DC

## 2023-08-18 MED ORDER — AMLODIPINE BESYLATE 5 MG PO TABS: 5.0000 mg | ORAL_TABLET | Freq: Every day | ORAL | 0 refills | Status: DC

## 2023-08-18 NOTE — Patient Instructions (Signed)
Start the amlodipine (blood pressure medication) as prescribed in the emergency department.  Monitor your blood pressure at home.   I have increased your levothyroxine dose. Please take this on an empty stomach as we discussed. Take it with water. NO COFFEE within 30-45 minutes.   Follow up with Dr. Yetta Barre in 4 weeks.

## 2023-08-18 NOTE — Progress Notes (Signed)
Subjective:     Patient ID: Ronald Bond, male    DOB: 1946-04-11, 77 y.o.   MRN: [id number]  Chief Complaint  Patient presents with   Hypertension    Yesterday BP went up to 210 and having slight headaches behind his eyes. States BP has been high all week in 160s-170s.    Hypertension Associated symptoms include headaches. Pertinent negatives include no chest pain, neck pain, palpitations or shortness of breath.    Discussed the use of AI scribe software for clinical note transcription with the patient, who gave verbal consent to proceed.  History of Present Illness          Here to follow up on ED visit last night for elevated BP, dizziness, and headache. His TSH was found to be elevated at 18.  States he has not been taking his levothyroxine on an empty stomach for the past couple of weeks.  MRI brain ruled out stroke. Evaluation showed no signs of endorgan damage.   Followed by Dr. Brooks Sailors for Luiz Blare.    States amlodipine sent to his pharmacy this morning by ED physician.   Appt with Dr. Dione Booze Monday.    Health Maintenance Due  Topic Date Due   Zoster Vaccines- Shingrix (2 of 2) 10/18/2019   INFLUENZA VACCINE  06/29/2023   COVID-19 Vaccine (8 - 2023-24 season) 07/30/2023    Past Medical History:  Diagnosis Date   Anemia    Chronic kidney disease    GERD (gastroesophageal reflux disease)    HTN (hypertension)    Hyperlipidemia    Hypothyroidism    Osteoarthritis    Pneumonia    Pre-diabetes    Sleep apnea    no CPAP   Wears glasses     Past Surgical History:  Procedure Laterality Date   COLONOSCOPY     EYE SURGERY  2006   both cataracts   MICROLARYNGOSCOPY Left 11/18/2013   Procedure: MICROLARYNGOSCOPY WITH REMOVAL OF GRANULOMA LEFT SIDE;  Surgeon: Serena Colonel, MD;  Location: [facility];  Service: ENT;  Laterality: Left;   NM MYOVIEW LTD  06/2007   Low risk, normal.  No evidence of ischemia or infarction   THYROIDECTOMY  1974    TONSILLECTOMY     as a child   TOTAL KNEE ARTHROPLASTY  2009   left   TOTAL KNEE ARTHROPLASTY Right 02/27/2023   Procedure: TOTAL KNEE ARTHROPLASTY;  Surgeon: Joen Laura, MD;  Location: [facility];  Service: Orthopedics;  Laterality: Right;   TOTAL KNEE REVISION Left 07/06/2022   Procedure: TOTAL KNEE REVISION;  Surgeon: Joen Laura, MD;  Location: [facility];  Service: Orthopedics;  Laterality: Left;    Family History  Problem Relation Age of Onset   Brain cancer Mother    Heart disease Father    Heart disease Brother     Social History   Socioeconomic History   Marital status: Married    Spouse name: Not on file   Number of children: 2   Years of education: Not on file   Highest education level: Not on file  Occupational History   Occupation: railroad  Tobacco Use   Smoking status: Never   Smokeless tobacco: Never  Vaping Use   Vaping status: Never Used  Substance and Sexual Activity   Alcohol use: Yes    Comment: occassionally   Drug use: No   Sexual activity: Yes  Other Topics Concern   Not on file  Social History Narrative  Regular Exercise -  YES      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  He states that he enjoys  the job a lot does a lot of walking and climbing stairs without significant discomfort.      He usually works ~2 weeks @ a time - stays in [employer] (paid for by [employer]) - & comes home for ~4 d weekends.   Social Determinants of Health   Financial Resource Strain: Low Risk  (12/26/2022)   Overall Financial Resource Strain (CARDIA)    Difficulty of Paying Living Expenses: Not hard at all  Food Insecurity: No Food Insecurity (03/02/2023)   Hunger Vital Sign    Worried About Running Out of Food in the Last Year: Never true    Ran Out of Food in the Last Year: Never true  Transportation Needs:  No Transportation Needs (03/02/2023)   PRAPARE - Administrator, Civil Service (Medical): No    Lack of Transportation (Non-Medical): No  Physical Activity: Insufficiently Active (12/26/2022)   Exercise Vital Sign    Days of Exercise per Week: 7 days    Minutes of Exercise per Session: 10 min  Stress: No Stress Concern Present (12/26/2022)   [employer] of Occupational Health - Occupational Stress Questionnaire    Feeling of Stress : Not at all  Social Connections: Moderately Isolated (12/26/2022)   Social Connection and Isolation Panel [NHANES]    Frequency of Communication with Friends and Family: More than three times a week    Frequency of Social Gatherings with Friends and Family: More than three times a week    Attends Religious Services: Never    Database administrator or Organizations: No    Attends Banker Meetings: Never    Marital Status: Married  Catering manager Violence: Not At Risk (02/27/2023)   Humiliation, Afraid, Rape, and Kick questionnaire    Fear of Current or Ex-Partner: No    Emotionally Abused: No    Physically Abused: No    Sexually Abused: No    Outpatient Medications Prior to Visit  Medication Sig Dispense Refill   albuterol (VENTOLIN HFA) 108 (90 Base) MCG/ACT inhaler Inhale 2 puffs into the lungs every 6 (six) hours as needed for wheezing or shortness of breath. 18 g 3   atorvastatin (LIPITOR) 40 MG tablet TAKE ONE TABLET (40 MG TOTAL) BY MOUTH DAILY AT 5 PM 90 tablet 0   Blood Pressure Monitoring (BLOOD PRESSURE MONITOR/ARM) DEVI 1 Device by Does not apply route daily. USE DAILY TO  TAKE BLOOD PRESSURE READINGS 1 each 0   cetirizine (ZYRTEC) 10 MG tablet Take 10 mg by mouth daily.     cholecalciferol (VITAMIN D3) 25 MCG (1000 UNIT) tablet Take 1,000 Units by mouth daily.     dapagliflozin propanediol (FARXIGA) 10 MG TABS tablet TAKE 1 TABLET BY MOUTH EVERY DAY BEFORE BREAKFAST 90 tablet 0   docusate sodium (COLACE) 100 MG  capsule Take 100 mg by mouth 2 (two) times daily.     ferrous sulfate 220 (44 Fe) MG/5ML solution TAKE 5 MLS (220 MG TOTAL) BY MOUTH DAILY 450 mL 0   Finerenone (KERENDIA) 20 MG TABS Take 1 tablet (20 mg total) by mouth daily. 90 tablet 0   fluticasone (FLONASE) 50 MCG/ACT nasal spray Place 2 sprays  into both nostrils daily. (Patient taking differently: Place 2 sprays into both nostrils daily as needed for allergies.) 48 g 1   hydrALAZINE (APRESOLINE) 25 MG tablet TAKE ONE TABLET (25 MG TOTAL) BY MOUTH THREE TIMES DAILY @ 9AM-1PM-5PM 270 tablet 0   metoprolol tartrate (LOPRESSOR) 25 MG tablet TAKE 0.5 TABLETS BY MOUTH 2 TIMES DAILY. 90 tablet 1   omeprazole (PRILOSEC) 40 MG capsule Take 1 capsule (40 mg total) by mouth daily. 90 capsule 0   Polyethyl Glycol-Propyl Glycol 0.4-0.3 % SOLN Place 1 drop into both eyes as needed (dry eyes).     tamsulosin (FLOMAX) 0.4 MG CAPS capsule TAKE ONE CAPSULE (0.4 MG TOTAL) BY MOUTH DAILY AT 9 AM AFTER BREAKFAST (Patient taking differently: Take 0.4 mg by mouth in the morning and at bedtime.) 90 capsule 0   TURMERIC CURCUMIN PO Take 1 tablet by mouth daily.     valsartan (DIOVAN) 320 MG tablet TAKE ONE TABLET (320 MG TOTAL) BY MOUTH DAILY AT 9 AM 90 tablet 0   levothyroxine (SYNTHROID) 112 MCG tablet Take 1 tablet (112 mcg total) by mouth daily. 90 tablet 3   apixaban (ELIQUIS) 2.5 MG TABS tablet Take 1 tablet (2.5 mg total) by mouth 2 (two) times daily. (Patient not taking: Reported on 08/17/2023) 60 tablet 0   No facility-administered medications prior to visit.    Allergies  Allergen Reactions   Lisinopril Other (See Comments)    Edema in face    Review of Systems  Constitutional:  Negative for chills and fever.  HENT:  Negative for congestion, ear pain and tinnitus.   Eyes:  Positive for redness. Negative for photophobia and pain.  Respiratory:  Negative for shortness of breath.   Cardiovascular:  Negative for chest pain, palpitations and leg  swelling.  Gastrointestinal:  Negative for abdominal pain, constipation, diarrhea, nausea and vomiting.  Genitourinary:  Negative for dysuria, frequency and urgency.  Musculoskeletal:  Negative for neck pain.  Neurological:  Positive for dizziness and headaches. Negative for tingling, tremors, speech change and focal weakness.       Objective:    Physical Exam Constitutional:      General: He is not in acute distress.    Appearance: He is not ill-appearing.  HENT:     Mouth/Throat:     Mouth: Mucous membranes are moist.     Pharynx: Oropharynx is clear.  Eyes:     Extraocular Movements: Extraocular movements intact.     Pupils: Pupils are equal, round, and reactive to light.     Comments: Mild proptosis of right eye  Cardiovascular:     Rate and Rhythm: Normal rate and regular rhythm.  Pulmonary:     Effort: Pulmonary effort is normal.     Breath sounds: Normal breath sounds.  Musculoskeletal:     Cervical back: Normal range of motion and neck supple.  Skin:    General: Skin is warm and dry.  Neurological:     General: No focal deficit present.     Mental Status: He is alert and oriented to person, place, and time.     Cranial Nerves: No facial asymmetry.     Motor: Motor function is intact.     Gait: Gait is intact.  Psychiatric:        Mood and Affect: Mood normal.        Speech: Speech normal.        Behavior: Behavior normal.        Thought Content:  Thought content normal.      BP 138/66 (BP Location: Left Arm, Patient Position: Sitting, Cuff Size: Large)   Pulse 60   Temp 98 F (36.7 C) (Temporal)   Ht 5\' 9"  (1.753 m)   Wt 202 lb (91.6 kg)   SpO2 97%   BMI 29.83 kg/m  Wt Readings from Last 3 Encounters:  08/18/23 202 lb (91.6 kg)  08/17/23 197 lb 15.6 oz (89.8 kg)  04/25/23 198 lb (89.8 kg)       Assessment & Plan:   Problem List Items Addressed This Visit       Cardiovascular and Mediastinum   Hypertensive heart disease without CHF (congestive  heart failure) - Primary (Chronic)     Endocrine   CKD stage 3 due to type 2 diabetes mellitus (HCC)   Graves' orbitopathy   Relevant Medications   levothyroxine (SYNTHROID) 125 MCG tablet   Postablative hypothyroidism   Relevant Medications   levothyroxine (SYNTHROID) 125 MCG tablet   Reviewed ED visit, lab and imaging results. No red flag symptoms today.  BP improved. He will start amlodipine as prescribed by ED physician. Closely monitor BP at home and follow up with PCP in 4 weeks.  TSH significantly elevated. He admits to not taking levothyroxine on an empty stomach. I sent in levothyroxine 125 mcg (higher dose) and educated him on taking the medication with only water. He will follow up with PCP in 4 weeks for a recheck.  He will see Dr. Dione Booze in 3 days, on Monday.     I have discontinued Lolita Rieger levothyroxine. I am also having him start on levothyroxine. Additionally, I am having him maintain his fluticasone, Polyethyl Glycol-Propyl Glycol, cholecalciferol, docusate sodium, albuterol, Blood Pressure Monitor/Arm, cetirizine, TURMERIC CURCUMIN PO, apixaban, metoprolol tartrate, dapagliflozin propanediol, Kerendia, omeprazole, tamsulosin, valsartan, ferrous sulfate, atorvastatin, and hydrALAZINE.  Meds ordered this encounter  Medications   levothyroxine (SYNTHROID) 125 MCG tablet    Sig: Take 1 tablet (125 mcg total) by mouth daily.    Dispense:  30 tablet    Refill:  1    Order Specific Question:   Supervising Provider    Answer:   Hillard Danker A [[id number]]

## 2023-08-19 LAB — T3, FREE: T3, Free: 2.1 pg/mL (ref 2.0–4.4)

## 2023-08-22 ENCOUNTER — Other Ambulatory Visit (HOSPITAL_BASED_OUTPATIENT_CLINIC_OR_DEPARTMENT_OTHER): Payer: Self-pay

## 2023-08-22 ENCOUNTER — Telehealth: Payer: Self-pay

## 2023-08-22 DIAGNOSIS — H40053 Ocular hypertension, bilateral: Secondary | ICD-10-CM | POA: Diagnosis not present

## 2023-08-22 DIAGNOSIS — H16211 Exposure keratoconjunctivitis, right eye: Secondary | ICD-10-CM | POA: Diagnosis not present

## 2023-08-22 DIAGNOSIS — H0520 Unspecified exophthalmos: Secondary | ICD-10-CM | POA: Diagnosis not present

## 2023-08-22 DIAGNOSIS — E0500 Thyrotoxicosis with diffuse goiter without thyrotoxic crisis or storm: Secondary | ICD-10-CM | POA: Diagnosis not present

## 2023-08-22 DIAGNOSIS — H04123 Dry eye syndrome of bilateral lacrimal glands: Secondary | ICD-10-CM | POA: Diagnosis not present

## 2023-08-22 DIAGNOSIS — H0102A Squamous blepharitis right eye, upper and lower eyelids: Secondary | ICD-10-CM | POA: Diagnosis not present

## 2023-08-22 DIAGNOSIS — H0102B Squamous blepharitis left eye, upper and lower eyelids: Secondary | ICD-10-CM | POA: Diagnosis not present

## 2023-08-22 DIAGNOSIS — H532 Diplopia: Secondary | ICD-10-CM | POA: Diagnosis not present

## 2023-08-22 LAB — HM DIABETES EYE EXAM

## 2023-08-22 MED ORDER — DORZOLAMIDE HCL-TIMOLOL MAL 2-0.5 % OP SOLN
1.0000 [drp] | Freq: Two times a day (BID) | OPHTHALMIC | 0 refills | Status: DC
  Filled 2023-08-22: qty 10, 90d supply, fill #0
  Filled 2023-11-27: qty 10, 90d supply, fill #1

## 2023-08-22 MED ORDER — LATANOPROST 0.005 % OP SOLN
1.0000 [drp] | Freq: Every day | OPHTHALMIC | 3 refills | Status: DC
  Filled 2023-08-22: qty 7.5, 75d supply, fill #0
  Filled 2023-11-06: qty 7.5, 75d supply, fill #1

## 2023-08-22 NOTE — Transitions of Care (Post Inpatient/ED Visit) (Signed)
08/22/2023  Name: Ronald Bond MRN: [id number] DOB: 1946-08-27  Today's TOC FU Call Status: Today's TOC FU Call Status:: Unsuccessful Call (1st Attempt) Unsuccessful Call (1st Attempt) Date: 08/22/23  Attempted to reach the patient regarding the most recent Inpatient/ED visit.  Follow Up Plan: No further outreach attempts will be made at this time. We have been unable to contact the patient.  Signature: Jeneen Rinks

## 2023-08-23 ENCOUNTER — Telehealth: Payer: Self-pay

## 2023-08-23 NOTE — Transitions of Care (Post Inpatient/ED Visit) (Signed)
08/23/2023  Name: Ronald Bond MRN: [id number] DOB: 1946/06/25  Today's TOC FU Call Status: Today's TOC FU Call Status:: Successful TOC FU Call Completed TOC FU Call Complete Date: 08/23/23 Patient's Name and Date of Birth confirmed.  Transition Care Management Follow-up Telephone Call Date of Discharge: 08/18/23 Discharge Facility: [facility]) Type of Discharge: Emergency Department Reason for ED Visit: Other: How have you been since you were released from the hospital?: Better Any questions or concerns?: No  Items Reviewed: Did you receive and understand the discharge instructions provided?: Yes Medications obtained,verified, and reconciled?: Yes (Medications Reviewed) Any new allergies since your discharge?: No Dietary orders reviewed?: NA Do you have support at home?: Yes People in Home: child(ren), adult Name of Support/Comfort Primary Source: French Ana and Tshawn  Medications Reviewed Today: Medications Reviewed Today   Medications were not reviewed in this encounter     Home Care and Equipment/Supplies: Were Home Health Services Ordered?: No Any new equipment or medical supplies ordered?: No  Functional Questionnaire: Do you need assistance with bathing/showering or dressing?: No Do you need assistance with meal preparation?: No Do you need assistance with eating?: No Do you have difficulty maintaining continence: No Do you need assistance with getting out of bed/getting out of a chair/moving?: No Do you have difficulty managing or taking your medications?: No  Follow up appointments reviewed: PCP Follow-up appointment confirmed?: Yes Date of PCP follow-up appointment?: 08/30/23 Follow-up Provider: Dr. Yetta Barre Specialist [facility] Follow-up appointment confirmed?: NA Do you need transportation to your follow-up appointment?: No Do you understand care options if your condition(s) worsen?: Yes-patient verbalized understanding    SIGNATURE: Reather Laurence, RMA

## 2023-08-30 ENCOUNTER — Encounter: Payer: Self-pay | Admitting: Internal Medicine

## 2023-08-30 ENCOUNTER — Ambulatory Visit: Payer: No Typology Code available for payment source | Admitting: Internal Medicine

## 2023-08-30 VITALS — BP 136/72 | HR 80 | Temp 98.1°F | Resp 16 | Ht 69.0 in | Wt 201.0 lb

## 2023-08-30 DIAGNOSIS — E1169 Type 2 diabetes mellitus with other specified complication: Secondary | ICD-10-CM | POA: Diagnosis not present

## 2023-08-30 DIAGNOSIS — E1122 Type 2 diabetes mellitus with diabetic chronic kidney disease: Secondary | ICD-10-CM | POA: Diagnosis not present

## 2023-08-30 DIAGNOSIS — E1129 Type 2 diabetes mellitus with other diabetic kidney complication: Secondary | ICD-10-CM

## 2023-08-30 DIAGNOSIS — N1832 Chronic kidney disease, stage 3b: Secondary | ICD-10-CM | POA: Diagnosis not present

## 2023-08-30 DIAGNOSIS — Z23 Encounter for immunization: Secondary | ICD-10-CM

## 2023-08-30 DIAGNOSIS — N183 Chronic kidney disease, stage 3 unspecified: Secondary | ICD-10-CM | POA: Diagnosis not present

## 2023-08-30 DIAGNOSIS — E118 Type 2 diabetes mellitus with unspecified complications: Secondary | ICD-10-CM

## 2023-08-30 DIAGNOSIS — I10 Essential (primary) hypertension: Secondary | ICD-10-CM

## 2023-08-30 DIAGNOSIS — E785 Hyperlipidemia, unspecified: Secondary | ICD-10-CM | POA: Diagnosis not present

## 2023-08-30 DIAGNOSIS — R809 Proteinuria, unspecified: Secondary | ICD-10-CM | POA: Diagnosis not present

## 2023-08-30 LAB — LIPID PANEL
Cholesterol: 165 mg/dL (ref 0–200)
HDL: 66.7 mg/dL (ref >39.00)
LDL Cholesterol: 78 mg/dL (ref 0–99)
NonHDL: 98.46
Total CHOL/HDL Ratio: 2
Triglycerides: 100 mg/dL (ref 0.0–149.0)
VLDL: 20 mg/dL (ref 0.0–40.0)

## 2023-08-30 LAB — HEMOGLOBIN A1C: Hgb A1c MFr Bld: 6.2 % (ref 4.6–6.5)

## 2023-08-30 LAB — URINALYSIS, ROUTINE W REFLEX MICROSCOPIC
Bilirubin Urine: NEGATIVE
Hgb urine dipstick: NEGATIVE
Ketones, ur: NEGATIVE
Leukocytes,Ua: NEGATIVE
Nitrite: NEGATIVE
Specific Gravity, Urine: 1.02 (ref 1.000–1.030)
Total Protein, Urine: 30 — AB
Urine Glucose: >=1000 — AB
Urobilinogen, UA: 0.2 (ref 0.0–1.0)
pH: 6 (ref 5.0–8.0)

## 2023-08-30 LAB — MICROALBUMIN / CREATININE URINE RATIO
Creatinine,U: 137.7 mg/dL
Microalb Creat Ratio: 21.4 mg/g (ref 0.0–30.0)
Microalb, Ur: 29.5 mg/dL — ABNORMAL HIGH (ref 0.0–1.9)

## 2023-08-30 MED ORDER — KERENDIA 20 MG PO TABS: 1.0000 | ORAL_TABLET | Freq: Every day | ORAL | 0 refills | Status: DC

## 2023-08-30 NOTE — Patient Instructions (Signed)
Hypertension, Adult High blood pressure (hypertension) is when the force of blood pumping through the arteries is too strong. The arteries are the blood vessels that carry blood from the heart throughout the body. Hypertension forces the heart to work harder to pump blood and may cause arteries to become narrow or stiff. Untreated or uncontrolled hypertension can lead to a heart attack, heart failure, a stroke, kidney disease, and other problems. A blood pressure reading consists of a higher number over a lower number. Ideally, your blood pressure should be below 120/80. The first ("top") number is called the systolic pressure. It is a measure of the pressure in your arteries as your heart beats. The second ("bottom") number is called the diastolic pressure. It is a measure of the pressure in your arteries as the heart relaxes. What are the causes? The exact cause of this condition is not known. There are some conditions that result in high blood pressure. What increases the risk? Certain factors may make you more likely to develop high blood pressure. Some of these risk factors are under your control, including: Smoking. Not getting enough exercise or physical activity. Being overweight. Having too much fat, sugar, calories, or salt (sodium) in your diet. Drinking too much alcohol. Other risk factors include: Having a personal history of heart disease, diabetes, high cholesterol, or kidney disease. Stress. Having a family history of high blood pressure and high cholesterol. Having obstructive sleep apnea. Age. The risk increases with age. What are the signs or symptoms? High blood pressure may not cause symptoms. Very high blood pressure (hypertensive crisis) may cause: Headache. Fast or irregular heartbeats (palpitations). Shortness of breath. Nosebleed. Nausea and vomiting. Vision changes. Severe chest pain, dizziness, and seizures. How is this diagnosed? This condition is diagnosed by  measuring your blood pressure while you are seated, with your arm resting on a flat surface, your legs uncrossed, and your feet flat on the floor. The cuff of the blood pressure monitor will be placed directly against the skin of your upper arm at the level of your heart. Blood pressure should be measured at least twice using the same arm. Certain conditions can cause a difference in blood pressure between your right and left arms. If you have a high blood pressure reading during one visit or you have normal blood pressure with other risk factors, you may be asked to: Return on a different day to have your blood pressure checked again. Monitor your blood pressure at home for 1 week or longer. If you are diagnosed with hypertension, you may have other blood or imaging tests to help your health care provider understand your overall risk for other conditions. How is this treated? This condition is treated by making healthy lifestyle changes, such as eating healthy foods, exercising more, and reducing your alcohol intake. You may be referred for counseling on a healthy diet and physical activity. Your health care provider may prescribe medicine if lifestyle changes are not enough to get your blood pressure under control and if: Your systolic blood pressure is above 130. Your diastolic blood pressure is above 80. Your personal target blood pressure may vary depending on your medical conditions, your age, and other factors. Follow these instructions at home: Eating and drinking  Eat a diet that is high in fiber and potassium, and low in sodium, added sugar, and fat. An example of this eating plan is called the DASH diet. DASH stands for Dietary Approaches to Stop Hypertension. To eat this way: Eat   plenty of fresh fruits and vegetables. Try to fill one half of your plate at each meal with fruits and vegetables. Eat whole grains, such as whole-wheat pasta, brown rice, or whole-grain bread. Fill about one  fourth of your plate with whole grains. Eat or drink low-fat dairy products, such as skim milk or low-fat yogurt. Avoid fatty cuts of meat, processed or cured meats, and poultry with skin. Fill about one fourth of your plate with lean proteins, such as fish, chicken without skin, beans, eggs, or tofu. Avoid pre-made and processed foods. These tend to be higher in sodium, added sugar, and fat. Reduce your daily sodium intake. Many people with hypertension should eat less than 1,500 mg of sodium a day. Do not drink alcohol if: Your health care provider tells you not to drink. You are pregnant, may be pregnant, or are planning to become pregnant. If you drink alcohol: Limit how much you have to: 0-1 drink a day for women. 0-2 drinks a day for men. Know how much alcohol is in your drink. In the [place] one drink equals one 12 oz bottle of beer (355 mL), one 5 oz glass of wine (148 mL), or one 1 oz glass of hard liquor (44 mL). Lifestyle  Work with your health care provider to maintain a healthy body weight or to lose weight. Ask what an ideal weight is for you. Get at least 30 minutes of exercise that causes your heart to beat faster (aerobic exercise) most days of the week. Activities may include walking, swimming, or biking. Include exercise to strengthen your muscles (resistance exercise), such as Pilates or lifting weights, as part of your weekly exercise routine. Try to do these types of exercises for 30 minutes at least 3 days a week. Do not use any products that contain nicotine or tobacco. These products include cigarettes, chewing tobacco, and vaping devices, such as e-cigarettes. If you need help quitting, ask your health care provider. Monitor your blood pressure at home as told by your health care provider. Keep all follow-up visits. This is important. Medicines Take over-the-counter and prescription medicines only as told by your health care provider. Follow directions carefully. Blood  pressure medicines must be taken as prescribed. Do not skip doses of blood pressure medicine. Doing this puts you at risk for problems and can make the medicine less effective. Ask your health care provider about side effects or reactions to medicines that you should watch for. Contact a health care provider if you: Think you are having a reaction to a medicine you are taking. Have headaches that keep coming back (recurring). Feel dizzy. Have swelling in your ankles. Have trouble with your vision. Get help right away if you: Develop a severe headache or confusion. Have unusual weakness or numbness. Feel faint. Have severe pain in your chest or abdomen. Vomit repeatedly. Have trouble breathing. These symptoms may be an emergency. Get help right away. Call 911. Do not wait to see if the symptoms will go away. Do not drive yourself to the hospital. Summary Hypertension is when the force of blood pumping through your arteries is too strong. If this condition is not controlled, it may put you at risk for serious complications. Your personal target blood pressure may vary depending on your medical conditions, your age, and other factors. For most people, a normal blood pressure is less than 120/80. Hypertension is treated with lifestyle changes, medicines, or a combination of both. Lifestyle changes include losing weight, eating a healthy,   low-sodium diet, exercising more, and limiting alcohol. This information is not intended to replace advice given to you by your health care provider. Make sure you discuss any questions you have with your health care provider. Document Revised: 09/21/2021 Document Reviewed: 09/21/2021 Elsevier Patient Education  2024 [employer]  

## 2023-08-30 NOTE — Progress Notes (Signed)
Subjective:  Patient ID: Ronald Bond, male    DOB: 04/07/1946  Age: 77 y.o. MRN: [id number]  CC: Hypothyroidism and Hypertension   HPI Ronald Bond presents for f/up -----  Discussed the use of AI scribe software for clinical note transcription with the patient, who gave verbal consent to proceed.  History of Present Illness   The patient, recently started on Amlodipine 5mg  for hypertension, reports a significant improvement in their blood pressure levels. They note that despite the blood pressure remaining high in the mornings, it has generally decreased. They deny experiencing any side effects from the medication.  In addition, the patient reports persistent visual disturbances, but denies any associated headaches. They also deny any symptoms of cardiovascular distress, such as chest pain or shortness of breath, and note a regular heartbeat. They have not observed any lower extremity edema.       Outpatient Medications Prior to Visit  Medication Sig Dispense Refill   albuterol (VENTOLIN HFA) 108 (90 Base) MCG/ACT inhaler Inhale 2 puffs into the lungs every 6 (six) hours as needed for wheezing or shortness of breath. 18 g 3   apixaban (ELIQUIS) 2.5 MG TABS tablet Take 1 tablet (2.5 mg total) by mouth 2 (two) times daily. 60 tablet 0   atorvastatin (LIPITOR) 40 MG tablet TAKE ONE TABLET (40 MG TOTAL) BY MOUTH DAILY AT 5 PM 90 tablet 0   Blood Pressure Monitoring (BLOOD PRESSURE MONITOR/ARM) DEVI 1 Device by Does not apply route daily. USE DAILY TO  TAKE BLOOD PRESSURE READINGS 1 each 0   cetirizine (ZYRTEC) 10 MG tablet Take 10 mg by mouth daily.     cholecalciferol (VITAMIN D3) 25 MCG (1000 UNIT) tablet Take 1,000 Units by mouth daily.     dapagliflozin propanediol (FARXIGA) 10 MG TABS tablet TAKE 1 TABLET BY MOUTH EVERY DAY BEFORE BREAKFAST 90 tablet 0   docusate sodium (COLACE) 100 MG capsule Take 100 mg by mouth 2 (two) times daily.     dorzolamide-timolol (COSOPT) 2-0.5 %  ophthalmic solution Place 1 drop into the right eye 2 (two) times daily. 30 mL 0   ferrous sulfate 220 (44 Fe) MG/5ML solution TAKE 5 MLS (220 MG TOTAL) BY MOUTH DAILY 450 mL 0   fluticasone (FLONASE) 50 MCG/ACT nasal spray Place 2 sprays into both nostrils daily. (Patient taking differently: Place 2 sprays into both nostrils daily as needed for allergies.) 48 g 1   hydrALAZINE (APRESOLINE) 25 MG tablet TAKE ONE TABLET (25 MG TOTAL) BY MOUTH THREE TIMES DAILY @ 9AM-1PM-5PM 270 tablet 0   latanoprost (XALATAN) 0.005 % ophthalmic solution Place 1 drop into both eyes at bedtime. 7.5 mL 3   levothyroxine (SYNTHROID) 125 MCG tablet Take 1 tablet (125 mcg total) by mouth daily. 30 tablet 1   metoprolol tartrate (LOPRESSOR) 25 MG tablet TAKE 0.5 TABLETS BY MOUTH 2 TIMES DAILY. 90 tablet 1   omeprazole (PRILOSEC) 40 MG capsule Take 1 capsule (40 mg total) by mouth daily. 90 capsule 0   Polyethyl Glycol-Propyl Glycol 0.4-0.3 % SOLN Place 1 drop into both eyes as needed (dry eyes).     tamsulosin (FLOMAX) 0.4 MG CAPS capsule TAKE ONE CAPSULE (0.4 MG TOTAL) BY MOUTH DAILY AT 9 AM AFTER BREAKFAST (Patient taking differently: Take 0.4 mg by mouth in the morning and at bedtime.) 90 capsule 0   TURMERIC CURCUMIN PO Take 1 tablet by mouth daily.     valsartan (DIOVAN) 320 MG tablet TAKE ONE TABLET (320  MG TOTAL) BY MOUTH DAILY AT 9 AM 90 tablet 0   Finerenone (KERENDIA) 20 MG TABS Take 1 tablet (20 mg total) by mouth daily. 90 tablet 0   No facility-administered medications prior to visit.    ROS Review of Systems  Constitutional:  Negative for appetite change, chills, diaphoresis, fatigue and fever.  HENT: Negative.    Eyes:  Positive for visual disturbance.  Respiratory:  Negative for cough, chest tightness, shortness of breath and wheezing.   Cardiovascular:  Negative for chest pain, palpitations and leg swelling.  Gastrointestinal:  Negative for abdominal pain, constipation, diarrhea, nausea and  vomiting.  Genitourinary: Negative.  Negative for difficulty urinating and dysuria.  Musculoskeletal: Negative.  Negative for arthralgias and myalgias.  Skin: Negative.  Negative for color change and pallor.  Neurological: Negative.  Negative for dizziness and weakness.  Hematological:  Negative for adenopathy. Does not bruise/bleed easily.  Psychiatric/Behavioral: Negative.      Objective:  BP 136/72 (BP Location: Left Arm, Patient Position: Sitting, Cuff Size: Large)   Pulse 80   Temp 98.1 F (36.7 C) (Oral)   Resp 16   Ht 5\' 9"  (1.753 m)   Wt 201 lb (91.2 kg)   SpO2 97%   BMI 29.68 kg/m   BP Readings from Last 3 Encounters:  08/30/23 136/72  08/18/23 138/66  08/18/23 (!) 182/84    Wt Readings from Last 3 Encounters:  08/30/23 201 lb (91.2 kg)  08/18/23 202 lb (91.6 kg)  08/17/23 197 lb 15.6 oz (89.8 kg)    Physical Exam Vitals reviewed.  Constitutional:      Appearance: Normal appearance.  HENT:     Nose: Nose normal.     Mouth/Throat:     Mouth: Mucous membranes are moist.  Eyes:     General: No scleral icterus.    Conjunctiva/sclera: Conjunctivae normal.  Cardiovascular:     Rate and Rhythm: Normal rate and regular rhythm.     Heart sounds: No murmur heard.    No friction rub. No gallop.  Pulmonary:     Effort: Pulmonary effort is normal.     Breath sounds: No stridor. No wheezing, rhonchi or rales.  Abdominal:     General: Abdomen is flat.     Palpations: There is no mass.     Tenderness: There is no abdominal tenderness. There is no guarding.     Hernia: No hernia is present.  Musculoskeletal:        General: Normal range of motion.     Cervical back: Neck supple.     Right lower leg: No edema.     Left lower leg: No edema.  Lymphadenopathy:     Cervical: No cervical adenopathy.  Skin:    General: Skin is warm and dry.  Neurological:     General: No focal deficit present.     Mental Status: He is alert. Mental status is at baseline.   Psychiatric:        Mood and Affect: Mood normal.        Behavior: Behavior normal.     Lab Results  Component Value Date   WBC 6.9 08/17/2023   HGB 13.3 08/17/2023   HCT 39.0 08/17/2023   PLT 242 08/17/2023   GLUCOSE 93 08/17/2023   CHOL 165 08/30/2023   TRIG 100.0 08/30/2023   HDL 66.70 08/30/2023   LDLDIRECT 75.0 03/08/2016   LDLCALC 78 08/30/2023   ALT 18 08/17/2023   AST 20 08/17/2023   NA  139 08/17/2023   K 3.6 08/17/2023   CL 103 08/17/2023   CREATININE 1.50 (H) 08/17/2023   BUN 19 08/17/2023   CO2 24 08/17/2023   TSH 18.428 (H) 08/17/2023   PSA 3.58 12/28/2022   INR 1.0 08/17/2023   HGBA1C 6.2 08/30/2023   MICROALBUR 29.5 (H) 08/30/2023    MR BRAIN WO CONTRAST  Result Date: 08/18/2023 CLINICAL DATA:  Dizziness, balance issues, high blood pressure; stroke suspected EXAM: MRI HEAD WITHOUT CONTRAST TECHNIQUE: Multiplanar, multiecho pulse sequences of the brain and surrounding structures were obtained without intravenous contrast. COMPARISON:  No prior MRI available, correlation is made with 08/17/2023 CT head FINDINGS: Brain: No restricted diffusion to suggest acute or subacute infarct. No acute hemorrhage, mass, mass effect, or midline shift. No hydrocephalus or extra-axial collection. Normal pituitary and craniocervical junction. No hemosiderin deposition to suggest remote hemorrhage. Scattered T2 hyperintense signal in the periventricular white matter, likely the sequela of mild chronic small vessel ischemic disease. Dilated perivascular spaces in the basal ganglia and cortex. Normal cerebral volume for age. Vascular: Normal arterial flow voids. Skull and upper cervical spine: Normal marrow signal. Sinuses/Orbits: Clear paranasal sinuses. As on the prior CT, there is enlargement of the right inferior rectus muscle, and to a lesser extent, the right medial, superior, lateral rectus muscles (series 21, image 26). Right proptosis. No acute finding in the left orbit. Other:  The mastoid air cells are well aerated. IMPRESSION: 1. No acute intracranial process. No evidence of acute or subacute infarct. 2. 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 Electronically Signed   By: Wiliam Ke M.D.   On: 08/18/2023 01:20   CT HEAD WO CONTRAST  Result Date: 08/17/2023 CLINICAL DATA:  Neuro deficit, acute, stroke suspected EXAM: CT HEAD WITHOUT CONTRAST TECHNIQUE: Contiguous axial images were obtained from the base of the skull through the vertex without intravenous contrast. RADIATION DOSE REDUCTION: This exam was performed according to the departmental dose-optimization program which includes automated exposure control, adjustment of the mA and/or kV according to patient size and/or use of iterative reconstruction technique. COMPARISON:  None Available. FINDINGS: Brain: No hemorrhage. No hydrocephalus. No extra-axial fluid collection. Possible age indeterminate infarct in the right thalamus. No mass effect. No mass lesion. Vascular: No hyperdense vessel or unexpected calcification. Skull: Normal. Negative for fracture or focal lesion. Sinuses/Orbits: No middle ear or mastoid effusion. Paranasal sinuses are clear. Bilateral lens replacement. There is asymmetric enlargement of the right inferior rectus muscle body (series 4, image 58). There is also mild right-sided proptosis. Other: None. IMPRESSION: 1. Possible age indeterminate infarct in the right thalamus. If there is clinical concern for an acute infarct, consider MRI for further evaluation. 2. Asymmetric enlargement of the right inferior rectus muscle body with mild right-sided proptosis. 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 Recommend correlation with ophthalmologic exam and thyroid labs. Electronically Signed   By: Lorenza Cambridge M.D.   On: 08/17/2023  19:46    Assessment & Plan:   Type II diabetes mellitus with manifestations (HCC)- his blood sugar is well controlled. -     Hemoglobin A1c; Future -     Microalbumin / creatinine urine ratio; Future -     Urinalysis, Routine w reflex microscopic; Future  CKD stage 3 due to type 2 diabetes mellitus (HCC) -     Microalbumin / creatinine  urine ratio; Future -     Urinalysis, Routine w reflex microscopic; Future  Essential hypertension, benign - his BP is adequately well controlled. -     Urinalysis, Routine w reflex microscopic; Future  Hyperlipidemia associated with type 2 diabetes mellitus (HCC) -     Lipid panel; Future  Flu vaccine need -     Flu Vaccine Trivalent High Dose (Fluad)  Stage 3b chronic kidney disease (HCC) -     Chauncey Mann; Take 1 tablet (20 mg total) by mouth daily.  Dispense: 90 tablet; Refill: 0  Type 2 diabetes mellitus with diabetic microalbuminuria, without long-term current use of insulin (HCC)- Will continue the ARB and MRA. Chauncey Mann; Take 1 tablet (20 mg total) by mouth daily.  Dispense: 90 tablet; Refill: 0  Type 2 diabetes mellitus with stage 3b chronic kidney disease, without long-term current use of insulin (HCC) -     Chauncey Mann; Take 1 tablet (20 mg total) by mouth daily.  Dispense: 90 tablet; Refill: 0     Follow-up: Return in about 3 months (around 11/30/2023).  Sanda Linger, MD

## 2023-09-04 ENCOUNTER — Other Ambulatory Visit: Payer: Self-pay | Admitting: Internal Medicine

## 2023-09-04 DIAGNOSIS — K2100 Gastro-esophageal reflux disease with esophagitis, without bleeding: Secondary | ICD-10-CM

## 2023-09-10 ENCOUNTER — Other Ambulatory Visit: Payer: Self-pay | Admitting: Family Medicine

## 2023-09-10 DIAGNOSIS — E890 Postprocedural hypothyroidism: Secondary | ICD-10-CM

## 2023-09-10 DIAGNOSIS — E0500 Thyrotoxicosis with diffuse goiter without thyrotoxic crisis or storm: Secondary | ICD-10-CM

## 2023-09-21 ENCOUNTER — Telehealth: Payer: Self-pay

## 2023-09-21 NOTE — Telephone Encounter (Signed)
Transition Care Management Unsuccessful Follow-up Telephone Call  Date of discharge and from where:  08/18/2023 [facility]  Attempts:  2nd Attempt  Reason for unsuccessful TCM follow-up call:  No answer/busy voicemail did not pick-up.  Izaya Netherton [facility]  [facility], [facility] Guide Direct Dial: [phone]  Website: [email or web address]

## 2023-09-21 NOTE — Telephone Encounter (Signed)
Transition Care Management Unsuccessful Follow-up Telephone Call  Date of discharge and from where:  08/18/2023 [facility]  Attempts:  1st Attempt  Reason for unsuccessful TCM follow-up call:  No answer/busy  Maite Burlison [facility]  [facility], [facility] Resource Care Guide Direct Dial: [phone]  Website: [email or web address]

## 2023-09-28 ENCOUNTER — Other Ambulatory Visit: Payer: Self-pay

## 2023-09-28 ENCOUNTER — Telehealth: Payer: Self-pay | Admitting: Internal Medicine

## 2023-09-28 DIAGNOSIS — J4520 Mild intermittent asthma, uncomplicated: Secondary | ICD-10-CM

## 2023-09-28 MED ORDER — ALBUTEROL SULFATE HFA 108 (90 BASE) MCG/ACT IN AERS: 2.0000 | INHALATION_SPRAY | Freq: Four times a day (QID) | RESPIRATORY_TRACT | 3 refills | Status: DC | PRN

## 2023-09-28 NOTE — Telephone Encounter (Signed)
Prescription Request  09/28/2023  LOV: 08/30/2023  What is the name of the medication or equipment? Amlodopine and albuteral inhaler  Have you contacted your pharmacy to request a refill? Yes   Which pharmacy would you like this sent to?  [id number] - [place], [place] - [address], [address] AT [facility] [address], [address] [place] [address] Phone: [phone] Fax: [phone]     Patient notified that their request is being sent to the clinical staff for review and that they should receive a response within 2 business days.   Please advise at Mobile [phone] (mobile)

## 2023-09-28 NOTE — Telephone Encounter (Signed)
 Medication refill sent to Dr. Yetta Barre

## 2023-10-03 NOTE — Telephone Encounter (Signed)
Patient requested a refill of amlodipine, but it is not on his medication list. Please advise.

## 2023-10-07 ENCOUNTER — Other Ambulatory Visit: Payer: Self-pay | Admitting: Internal Medicine

## 2023-10-07 DIAGNOSIS — N1832 Chronic kidney disease, stage 3b: Secondary | ICD-10-CM

## 2023-10-07 DIAGNOSIS — E118 Type 2 diabetes mellitus with unspecified complications: Secondary | ICD-10-CM

## 2023-10-14 ENCOUNTER — Other Ambulatory Visit: Payer: Self-pay | Admitting: Family Medicine

## 2023-10-14 DIAGNOSIS — E890 Postprocedural hypothyroidism: Secondary | ICD-10-CM

## 2023-10-14 DIAGNOSIS — E0500 Thyrotoxicosis with diffuse goiter without thyrotoxic crisis or storm: Secondary | ICD-10-CM

## 2023-10-17 ENCOUNTER — Other Ambulatory Visit: Payer: Self-pay

## 2023-10-17 ENCOUNTER — Telehealth: Payer: Self-pay | Admitting: Cardiology

## 2023-10-17 ENCOUNTER — Telehealth: Payer: Self-pay | Admitting: Internal Medicine

## 2023-10-17 DIAGNOSIS — N401 Enlarged prostate with lower urinary tract symptoms: Secondary | ICD-10-CM

## 2023-10-17 DIAGNOSIS — E785 Hyperlipidemia, unspecified: Secondary | ICD-10-CM

## 2023-10-17 MED ORDER — TAMSULOSIN HCL 0.4 MG PO CAPS
0.4000 mg | ORAL_CAPSULE | Freq: Every day | ORAL | 0 refills | Status: DC
Start: 2023-10-17 — End: 2023-11-27

## 2023-10-17 MED ORDER — METOPROLOL TARTRATE 25 MG PO TABS: ORAL_TABLET | ORAL | 0 refills | Status: DC

## 2023-10-17 MED ORDER — ATORVASTATIN CALCIUM 40 MG PO TABS
40.0000 mg | ORAL_TABLET | Freq: Every day | ORAL | 0 refills | Status: DC
Start: 2023-10-17 — End: 2023-12-21

## 2023-10-17 MED ORDER — VALSARTAN 320 MG PO TABS: 320.0000 mg | ORAL_TABLET | Freq: Every day | ORAL | 0 refills | Status: DC

## 2023-10-17 NOTE — Telephone Encounter (Signed)
 Medication refill sent to Dr. Yetta Barre

## 2023-10-17 NOTE — Telephone Encounter (Signed)
 *  STAT* If patient is at the pharmacy, call can be transferred to refill team.   1. Which medications need to be refilled? (please list name of each medication and dose if known) metoprolol tartrate (LOPRESSOR) 25 MG tablet    2. Would you like to learn more about the convenience, safety, & potential cost savings by using the [facility]?    3. Are you open to using the [facility] (Type [facility].   4. Which pharmacy/location (including street and city if local pharmacy) is medication to be sent to? [facility] - [place], [place] [address]    5. Do they need a 30 day or 90 day supply? 90 days

## 2023-10-17 NOTE — Telephone Encounter (Signed)
Pharmacy requested 60 day supplies of all the below medications.   Prescription Request  10/17/2023  LOV: 08/30/2023  What is the name of the medication or equipment?  tamsulosin (FLOMAX) 0.4 MG CAPS capsule  valsartan (DIOVAN) 320 MG tablet atorvastatin (LIPITOR) 40 MG tablet  Have you contacted your pharmacy to request a refill? Yes   Which pharmacy would you like this sent to?  [facility] - [place], [place] - [address] [phone] [address] [place] [address] Phone: [phone] Fax: [phone]    Patient notified that their request is being sent to the clinical staff for review and that they should receive a response within 2 business days.   Please advise at Mobile [phone] (mobile)

## 2023-10-25 ENCOUNTER — Other Ambulatory Visit: Payer: Self-pay | Admitting: General Practice

## 2023-10-30 ENCOUNTER — Encounter: Payer: Self-pay | Admitting: Cardiology

## 2023-10-30 ENCOUNTER — Ambulatory Visit: Payer: No Typology Code available for payment source | Attending: Cardiology | Admitting: Cardiology

## 2023-10-30 VITALS — BP 144/70 | HR 61 | Ht 70.0 in | Wt 208.8 lb

## 2023-10-30 DIAGNOSIS — E66811 Obesity, class 1: Secondary | ICD-10-CM | POA: Diagnosis not present

## 2023-10-30 DIAGNOSIS — E1169 Type 2 diabetes mellitus with other specified complication: Secondary | ICD-10-CM | POA: Diagnosis not present

## 2023-10-30 DIAGNOSIS — I491 Atrial premature depolarization: Secondary | ICD-10-CM

## 2023-10-30 DIAGNOSIS — I10 Essential (primary) hypertension: Secondary | ICD-10-CM | POA: Diagnosis not present

## 2023-10-30 DIAGNOSIS — E785 Hyperlipidemia, unspecified: Secondary | ICD-10-CM | POA: Diagnosis not present

## 2023-10-30 DIAGNOSIS — G4733 Obstructive sleep apnea (adult) (pediatric): Secondary | ICD-10-CM | POA: Diagnosis not present

## 2023-10-30 DIAGNOSIS — I119 Hypertensive heart disease without heart failure: Secondary | ICD-10-CM

## 2023-10-30 MED ORDER — AMLODIPINE BESYLATE 5 MG PO TABS: 5.0000 mg | ORAL_TABLET | Freq: Every day | ORAL | 3 refills | Status: DC

## 2023-10-30 MED ORDER — HYDRALAZINE HCL 25 MG PO TABS
25.0000 mg | ORAL_TABLET | Freq: Three times a day (TID) | ORAL | Status: DC
Start: 2023-10-30 — End: 2023-11-04

## 2023-10-30 NOTE — Progress Notes (Unsigned)
Cardiology Office Note:  .   Date:  11/04/2023  ID:  Ronald Bond, DOB Dec 12, 1945, MRN [id number] PCP: [name], MD  [facility] Providers Cardiologist:  Bryan Lemma, MD     No chief complaint on file.   Patient Profile: .     MALE Ronald Bond is a borderline obese 77 y.o. male with a PMH notable for Hypertension/Hypertensive Heart Disease (without CHF), OSA but without regular OSA use, palpitations (brief runs of PAT), Bradycardia/Fatigue While on Annual Follow-Up Beta-Blocker (off beta-blocker Oneta Rack) who presents here for ~ annual f/u at the request of [name], MD.    Ronald Bond was last seen on September 06, 2022 by Edd Fabian, NP.  Prior to that I seen him in January 2023 after stopping his beta-blocker.  Energy level improved dramatically.  Noted some exertional dyspnea going up stairs but otherwise doing well.  In his visit with Verdon Cummins, he was feeling well.  He recovered from the left knee surgery.  Noted occasional chest discomfort lasting maybe 30 minutes that was nonexertional.  BP was 136/62.  He had noted some higher blood pressures at home.  He had also gained about 3 pounds.  Had had some dietary discretion over the past couple weekends. -> We discussed Salty 6 diet, increasing physical activity and monitoring his blood pressures.  Subjective  Discussed the use of AI scribe software for clinical note transcription with the patient, who gave verbal consent to proceed.  History of Present Illness   [name], a 77 year old patient with a history of hypertension, presented for a follow-up visit. The patient reported that his blood pressure had been running high, SBPs up to the 160s, in recent months. This was a significant change from his usual levels, and he had even visited the emergency room in October due to concerns about a potential stroke. However, no stroke or other acute issues were identified at that time.  The patient had been  taking Kerendia 20 mg daily, Lopressor 12.5 mg twice daily, valsartan 320 mg daily as well as hydralazine 25 mg 3 times daily for blood pressure management. , but it never really got fully controlled.  He did report that his blood pressure had improved while on a short course of Amlodipine, which had been prescribed during his emergency room visit. He had no refills for this medication and had not continued it.  The patient reported experiencing occasional, mild chest pain on the right side, which could occur at any time and did not worsen with exertion. He denied any symptoms of heart racing, skipping, or flip-flopping. He also denied any leg swelling, shortness of breath when lying flat, or waking up short of breath.  NO Left sided chest pain or pressure with rest or exertion.  Mild exertional dyspnea, but not significant.   No TIA/CVA or amaurosis fugax symptoms & no claudication.    He has undergone two knee replacements since his last visit. He described himself as the "bionic guy" and reported that he had recently retired. Despite retirement, he remained active, doing handyman work and walking regularly. The patient also reported experiencing headaches, blurred vision, and dizziness. He described the headaches as low-grade and mentioned having one on the day of the visit. However, he denied any sudden onset weakness, slurred speech, drooling, or passing out spells.  The patient's labs, last checked in October, were reportedly stable. His creatinine levels had fluctuated in the past but were currently at his baseline.  He was in the borderline diabetes range. His cholesterol levels were also checked and were reportedly good.     Cardiovascular ROS: positive for - R sided CP 0 non-exertional; DOE from deconditioning negative for - edema, irregular heartbeat, orthopnea, palpitations, paroxysmal nocturnal dyspnea, rapid heart rate, shortness of breath, or syncope/near syncope, TIA/amaurosis fugax;  claudicaiton  ROS:  Review of Systems - Negative except symptoms noted above.     Objective   Medications - Atorvastatin 40 mg daily - Lopressor (metoprolol tartrate) 12.5 mg twice daily - Valsartan 320 mg daily - - Lopressor 12.5 mg twice a day - Hydralazine 25 mg 3 times a day - Farxiga 10 mg daily - Tamsulosin 0.4 mg daily - Omeprazole 40 mg daily - Albuterol 108 mcg/ACT inhaler 2 puffs every 6 hours as needed - Cetirizine 10 mg p.o. daily; Fluticasone 50 mcg/ACT nasal spray 2 sprays both nostrils daily - Cholecalciferol 25 mcg (1000 unit) tablet daily - Colace 100 mg p.o. twice daily - Cosopt eyedrops 2-0.5% 1 drop twice daily; Latanoprost 0.005% eyedrops 1 drop per eye nightly - Synthroid 125 mcg daily - Ferrous sulfate 322 mg / 5 mL -> 5 mL daily No longer on apixaban 2.5 mg twice daily  Studies Reviewed: Marland Kitchen   EKG Interpretation Date/Time:  Monday October 30 2023 16:23:19 EST Ventricular Rate:  52 PR Interval:  186 QRS Duration:  96 QT Interval:  442 QTC Calculation: 411 R Axis:   -47  Text Interpretation: Sinus bradycardia Left anterior fascicular block Possible Anterior infarct (cited on or before 17-Aug-2023) When compared with ECG of 17-Aug-2023 17:57, No significant change was found Confirmed by Bryan Lemma ([id number]) on 10/30/2023 4:42:06 PM    Lab Results  Component Value Date   CHOL 165 08/30/2023   HDL 66.70 08/30/2023   LDLCALC 78 08/30/2023   LDLDIRECT 75.0 03/08/2016   TRIG 100.0 08/30/2023   CHOLHDL 2 08/30/2023   Lab Results  Component Value Date   HGBA1C 6.2 08/30/2023   Lab Results  Component Value Date   NA 139 08/17/2023   K 3.6 08/17/2023   CREATININE 1.50 (H) 08/17/2023   GFRNONAA 48 (L) 08/17/2023   GLUCOSE 93 08/17/2023  Creatinine: 2.1 (03/2023) Creatinine: 1.3 (03/2023) Creatinine: 1.5 (2020)  RADIOLOGY Coronary CTA 05/24/2019: CAC 651.  Extensive probably nonobstructive plaque in the LAD and RCA.  PDA is small but cannot rule  out moderate to severe ostial and proximal PDA disease-negative by CT FFR.  Moderate RI without significant plaque.  Mild plaque in the proximal LCx.  MRI: visual disturbances (08/2023)  Risk Assessment/Calculations:     Blood pressures were elevated today, plan is for him to increase his dose of hydralazine to 2 tablets TID and then start amlodipine 5 mg daily.  Reassess in 2 months.  Physical Exam:   VS:  BP (!) 144/70   Pulse 61   Ht 5\' 10"  (1.778 m)   Wt 208 lb 12.8 oz (94.7 kg)   SpO2 99%   BMI 29.96 kg/m    Wt Readings from Last 3 Encounters:  10/30/23 208 lb 12.8 oz (94.7 kg)  08/30/23 201 lb (91.2 kg)  08/18/23 202 lb (91.6 kg)    GEN: Well nourished, well developed in no acute distress; healthy appearing; eyes bloodshot.  NECK: No JVD; No carotid bruits CARDIAC: RRR, Normal S1, S2 with S4; no murmurs, rubs,  RESPIRATORY:  Clear to auscultation without rales, wheezing or rhonchi ; nonlabored, good air movement. ABDOMEN: Soft, non-tender, non-distended  EXTREMITIES:  No edema; No deformity     ASSESSMENT AND PLAN: .    Problem List Items Addressed This Visit       Cardiology Problems   Hyperlipidemia associated with type 2 diabetes mellitus (HCC) (Chronic)    Remains on 40 mL of atorvastatin along with Farxiga with LDL pretty well-controlled at 78 based on Coronary Calcium Score and A1c of 6.2.  Would like to see LDL less than 70, but he has had some recent dietary discretion.  If not < 70 by next visit, will consider addition of Zetia or Nexletol.   Continue Farxiga for glycemic control. => Renal and cardiac benefit      Relevant Medications   amLODipine (NORVASC) 5 MG tablet   Hypertensive heart disease without CHF (congestive heart failure) - Primary (Chronic)    Elevated blood pressure readings at home (up to 160s). Currently on Valsartan 320 mg, Lopressor 12.5mg  BID, and Hydralazine 25 mg TID. => had good response to amlodipine prescribed by EDP. No symptoms  of end-organ damage (no headaches, blurred vision, or dizziness). -Double Hydralazine dose tonight and tomorrow morning. -Start Amlodipine 5mg  daily starting tomorrow. -If home blood pressure readings remain above 150/90, double the next dose of Hydralazine. (as PRN)  -Schedule follow-up in 2 months with APP to reassess blood pressure control and kidney function.      Relevant Medications   amLODipine (NORVASC) 5 MG tablet   Other Relevant Orders   EKG 12-Lead (Completed)   PAC (premature atrial contraction) (Chronic)    Seemingly well-controlled on current dose of Lopressor.  Did not tolerate higher doses with bradycardia and fatigue.      Relevant Medications   amLODipine (NORVASC) 5 MG tablet   Other Relevant Orders   EKG 12-Lead (Completed)     Other   Obesity (BMI 30.0-34.9) (Chronic)    His weight actually looks pretty good today, but has been lighter. I think he would be able to get below 200 pounds, he will help his blood pressure as well as lipids and glycemic control.      Obstructive sleep apnea (Chronic)    He does not routinely use CPAP, counseled him on importance of using it which would help his blood pressure.       General Health Maintenance -Continue current medications including Kerendia. -Plan for follow-up in 8 months with the physician.      Follow-Up: No follow-ups on file.  Total time spent: 20 min spent with patient + 18 min spent charting = 38 min in the care of Ronald Bond today including reviewing labs (3 min), reviewing studies (3 min), face to face time discussing treatment options (20), 12 min documenting, and documenting in the encounter.     Signed, Marykay Lex, MD, MS Bryan Lemma, M.D., M.S. Interventional Cardiologist  [facility]  Pager # [phone] Phone # [phone] [address] [place], [place] [address]

## 2023-10-30 NOTE — Patient Instructions (Signed)
Medication Instruction  Take  hydralazine  double up the dose  tonight   and in the morning     ( if need you can take an extra dos of Hydralazine  if blood pressure is greater than 150/90  Start  taking Amlopdine 5 mg  in the morning  daily   *If you need a refill on your cardiac medications before your next appointment, please call your pharmacy*   Lab Work: Not needed If you have labs (blood work) drawn today and your tests are completely normal, you will receive your results only by: MyChart Message (if you have MyChart) OR A paper copy in the mail If you have any lab test that is abnormal or we need to change your treatment, we will call you to review the results.   Testing/Procedures:  Not needed  Follow-Up: At [facility], you and your health needs are our priority.  As part of our continuing mission to provide you with exceptional heart care, we have created designated Provider Care Teams.  These Care Teams include your primary Cardiologist (physician) and Advanced Practice Providers (APPs -  Physician Assistants and Nurse Practitioners) who all work together to provide you with the care you need, when you need it.     Your next appointment:   2 month(s)  The format for your next appointment:   In Person  Provider:    Bernadene Person, NP    Then, Bryan Lemma, MD will plan to see you again in 8 month(s).   Other Instructions

## 2023-11-02 ENCOUNTER — Telehealth: Payer: Self-pay | Admitting: Internal Medicine

## 2023-11-02 NOTE — Telephone Encounter (Signed)
[facility]le from [facility] called wanting clarification on the Rx for Tamsulosin asking if the pt taking it 1 or 2 times a day.  Best call back is [phone]

## 2023-11-03 NOTE — Telephone Encounter (Signed)
Unable to reach patient. LMTRC  

## 2023-11-04 ENCOUNTER — Other Ambulatory Visit: Payer: Self-pay | Admitting: Internal Medicine

## 2023-11-04 DIAGNOSIS — I119 Hypertensive heart disease without heart failure: Secondary | ICD-10-CM

## 2023-11-04 DIAGNOSIS — N1832 Chronic kidney disease, stage 3b: Secondary | ICD-10-CM

## 2023-11-04 DIAGNOSIS — R809 Proteinuria, unspecified: Secondary | ICD-10-CM

## 2023-11-04 DIAGNOSIS — I10 Essential (primary) hypertension: Secondary | ICD-10-CM

## 2023-11-04 DIAGNOSIS — E1122 Type 2 diabetes mellitus with diabetic chronic kidney disease: Secondary | ICD-10-CM

## 2023-11-04 NOTE — Assessment & Plan Note (Addendum)
Remains on 40 mL of atorvastatin along with Farxiga with LDL pretty well-controlled at 78 based on Coronary Calcium Score and A1c of 6.2.  Would like to see LDL less than 70, but he has had some recent dietary discretion.  If not < 70 by next visit, will consider addition of Zetia or Nexletol.   Continue Farxiga for glycemic control. => Renal and cardiac benefit

## 2023-11-04 NOTE — Assessment & Plan Note (Signed)
His weight actually looks pretty good today, but has been lighter. I think he would be able to get below 200 pounds, he will help his blood pressure as well as lipids and glycemic control.

## 2023-11-04 NOTE — Assessment & Plan Note (Signed)
Seemingly well-controlled on current dose of Lopressor.  Did not tolerate higher doses with bradycardia and fatigue.

## 2023-11-04 NOTE — Assessment & Plan Note (Signed)
He does not routinely use CPAP, counseled him on importance of using it which would help his blood pressure.

## 2023-11-04 NOTE — Assessment & Plan Note (Signed)
Elevated blood pressure readings at home (up to 160s). Currently on Valsartan 320 mg, Lopressor 12.5mg  BID, and Hydralazine 25 mg TID. => had good response to amlodipine prescribed by EDP. No symptoms of end-organ damage (no headaches, blurred vision, or dizziness). -Double Hydralazine dose tonight and tomorrow morning. -Start Amlodipine 5mg  daily starting tomorrow. -If home blood pressure readings remain above 150/90, double the next dose of Hydralazine. (as PRN)  -Schedule follow-up in 2 months with APP to reassess blood pressure control and kidney function.

## 2023-11-06 DIAGNOSIS — E0500 Thyrotoxicosis with diffuse goiter without thyrotoxic crisis or storm: Secondary | ICD-10-CM | POA: Diagnosis not present

## 2023-11-10 DIAGNOSIS — E0500 Thyrotoxicosis with diffuse goiter without thyrotoxic crisis or storm: Secondary | ICD-10-CM | POA: Diagnosis not present

## 2023-11-10 DIAGNOSIS — H0520 Unspecified exophthalmos: Secondary | ICD-10-CM | POA: Diagnosis not present

## 2023-11-10 NOTE — Telephone Encounter (Signed)
Unable to reach patient. LMTRC  

## 2023-11-23 ENCOUNTER — Other Ambulatory Visit: Payer: Self-pay | Admitting: Internal Medicine

## 2023-11-23 DIAGNOSIS — N401 Enlarged prostate with lower urinary tract symptoms: Secondary | ICD-10-CM

## 2023-11-27 ENCOUNTER — Telehealth: Payer: Self-pay

## 2023-11-27 ENCOUNTER — Other Ambulatory Visit: Payer: Self-pay

## 2023-11-27 NOTE — Telephone Encounter (Signed)
Copied from CRM [id number]. Topic: Clinical - Prescription Issue >> Nov 27, 2023 12:55 PM Ronald Bond wrote: Reason for CRM: Patient called in regarding his tamsulosin (FLOMAX) 0.4 MG CAPS capsule. It was sent to a mail order pharmacy and [facility] [address] [facility] for him to pick up. Please let patient know when Rx has been sent in.  [id number] [place] [place] - [address], [address] AT [facility] [address], [address] [place] [address] Phone: [phone] Fax: [phone]

## 2023-11-27 NOTE — Telephone Encounter (Signed)
Medication refill has been sent to Dr. Yetta Barre with the correct pharmacy attached

## 2023-12-04 ENCOUNTER — Other Ambulatory Visit: Payer: Self-pay | Admitting: Internal Medicine

## 2023-12-04 DIAGNOSIS — K2100 Gastro-esophageal reflux disease with esophagitis, without bleeding: Secondary | ICD-10-CM

## 2023-12-05 ENCOUNTER — Ambulatory Visit: Payer: Medicare (Managed Care) | Admitting: Internal Medicine

## 2023-12-05 ENCOUNTER — Encounter: Payer: Self-pay | Admitting: Internal Medicine

## 2023-12-05 VITALS — BP 124/76 | HR 90 | Ht 70.0 in | Wt 212.0 lb

## 2023-12-05 DIAGNOSIS — E0500 Thyrotoxicosis with diffuse goiter without thyrotoxic crisis or storm: Secondary | ICD-10-CM

## 2023-12-05 DIAGNOSIS — E890 Postprocedural hypothyroidism: Secondary | ICD-10-CM

## 2023-12-05 NOTE — Patient Instructions (Signed)

## 2023-12-05 NOTE — Progress Notes (Signed)
 Name: Ronald Bond  MRN/ DOB: [id number], 1946-08-17    Age/ Sex: 78 y.o., male     PCP: Joshua Debby CROME, MD   Reason for Endocrinology Evaluation:  Yvone' disease     Initial Endocrinology Clinic Visit: 10/07/2019    PATIENT IDENTIFIER: [name] is a 78 y.o., male with a past medical history of Graves' disease, HTN, dyslipidemia and CHF. He has followed with [facility] since 10/07/2019 for consultative assistance with management of his Graves' disease  HISTORICAL SUMMARY: The patient was first diagnosed with hyperthyroidism 1974 , requiring total thyroidectomy with benign pathology, but a couple of years later the pt was noted with hyperthyroidism again due tissue regrowth, requiring  RAI ablation , and was on LT-4 replacement until 07/2019 when this was stopped due to a low TSH 0.12 uIU/mL   But the patient restarted his L T4 replacement weeks later due to palpitation and abnormal sensation.  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  He was treated with prednisone , without any improvement in his vision.  He was unable to obtain the Tepezza due to shortage per patient.  He is S/P radiation therapy x3  in [place]   In 2021    He normally sees Dr. Octavia locally and has an appointment to see him today    Mother with graves' disease    SUBJECTIVE:   Today (12/05/2023):  Ronald Bond is here for follow-up on Graves' disease.     He completed radiation therapy for Grave's orbitopathy in [place].  S/P right eye sx 05/2020 and 01/2021 Orbital CT 11/23/2023   showed no significant change from 2022, including asymmetric enlargement of the right inferior rectus muscle  Denies local neck swelling  Has occasional  diplopia  Denies palpitations  Denies loose stools or diarrhea   Levothyroxine  125 mcg daily     HISTORY:  Past Medical History:  Past Medical History:  Diagnosis Date   Allergy    Anemia    Asthma    Cataract    Chronic kidney disease    Diabetes mellitus without complication (HCC)    GERD (gastroesophageal reflux disease)    Glaucoma    HTN (hypertension)    Hyperlipidemia    Hypothyroidism    Osteoarthritis    Pneumonia    Pre-diabetes    Sleep apnea    no CPAP   Wears glasses    Past Surgical History:  Past Surgical History:  Procedure Laterality Date   COLONOSCOPY     EYE SURGERY  2006   both cataracts   JOINT REPLACEMENT     MICROLARYNGOSCOPY Left 11/18/2013   Procedure: MICROLARYNGOSCOPY WITH REMOVAL OF GRANULOMA LEFT SIDE;  Surgeon: Ida Loader, MD;  Location: [facility];  Service: ENT;  Laterality: Left;   NM MYOVIEW  LTD  06/2007   Low risk, normal.  No evidence of ischemia or infarction   THYROIDECTOMY  1974   TONSILLECTOMY     as a child   TOTAL KNEE ARTHROPLASTY  2009   left   TOTAL KNEE ARTHROPLASTY Right 02/27/2023  Procedure: TOTAL KNEE ARTHROPLASTY;  Surgeon: Edna Toribio LABOR, MD;  Location: [facility];  Service: Orthopedics;  Laterality: Right;   TOTAL KNEE REVISION Left 07/06/2022   Procedure: TOTAL KNEE REVISION;  Surgeon: Edna Toribio LABOR, MD;  Location: [facility];  Service: Orthopedics;  Laterality: Left;   Social History:  reports that he has never smoked. He has never used smokeless tobacco. He reports that he does not currently use alcohol . He reports that he does not use drugs. Family History:  Family History  Problem Relation Age of Onset   Brain cancer Mother    Heart disease Father    Heart disease Brother      HOME MEDICATIONS: Allergies as of 12/05/2023        Reactions   Lisinopril Other (See Comments)   Edema in face        Medication List        Accurate as of December 05, 2023 10:31 AM. If you have any questions, ask your nurse or doctor.          ACCRUFeR  30 MG Caps Generic drug: Ferric Maltol  SMARTSIG:1 Capsule(s) By Mouth Morning-Night   albuterol  108 (90 Base) MCG/ACT inhaler Commonly known as: VENTOLIN  HFA Inhale 2 puffs into the lungs every 6 (six) hours as needed for wheezing or shortness of breath.   amLODipine  5 MG tablet Commonly known as: NORVASC  Take 1 tablet (5 mg total) by mouth daily.   atorvastatin  40 MG tablet Commonly known as: LIPITOR Take 1 tablet (40 mg total) by mouth daily.   Blood Pressure Monitor/Arm Devi 1 Device by Does not apply route daily. USE DAILY TO  TAKE BLOOD PRESSURE READINGS   cetirizine 10 MG tablet Commonly known as: ZYRTEC Take 10 mg by mouth daily.   cholecalciferol  25 MCG (1000 UNIT) tablet Commonly known as: VITAMIN D3 Take 1,000 Units by mouth daily.   docusate sodium  100 MG capsule Commonly known as: COLACE Take 100 mg by mouth 2 (two) times daily.   dorzolamide -timolol  2-0.5 % ophthalmic solution Commonly known as: COSOPT  Place 1 drop into the right eye 2 (two) times daily.   dorzolamide -timolol  2-0.5 % ophthalmic solution Commonly known as: COSOPT  Apply to eye.   Farxiga  10 MG Tabs tablet Generic drug: dapagliflozin  propanediol TAKE ONE TABLET BY MOUTH DAILY AT 9 AM BEFORE BREAKFAST   ferrous sulfate  220 (44 Fe) MG/5ML solution TAKE 5 MLS (220 MG TOTAL) BY MOUTH DAILY   fluticasone  50 MCG/ACT nasal spray Commonly known as: FLONASE  Place 2 sprays into both nostrils daily. What changed:  when to take this reasons to take this   hydrALAZINE  25 MG tablet Commonly known as: APRESOLINE  TAKE ONE TABLET BY MOUTH THREE TIMES DAILY @ 9AM-1PM-5PM   Kerendia  20 MG Tabs Generic drug: Finerenone  TAKE ONE TABLET (20MG  TOTAL) BY MOUTH DAILY AT 9 AM    latanoprost  0.005 % ophthalmic solution Commonly known as: XALATAN  Place 1 drop into both eyes at bedtime.   levothyroxine  125 MCG tablet Commonly known as: SYNTHROID  TAKE 1 TABLET BY MOUTH EVERY DAY   metoprolol  tartrate 25 MG tablet Commonly known as: LOPRESSOR  TAKE 1/2 TABLET BY MOUTH TWICE DAILY @ 9AM & 5PM   omeprazole  40 MG capsule Commonly known as: PRILOSEC TAKE ONE CAPSULE (40 MG TOTAL) BY MOUTH DAILY AT 9 AM   ONE-A-DAY 50 PLUS PO Take by mouth daily.   Polyethyl Glycol-Propyl Glycol 0.4-0.3 % Soln Place 1 drop into both eyes as needed (dry eyes).   tamsulosin   0.4 MG Caps capsule Commonly known as: FLOMAX  TAKE 1 CAPSULE (0.4 MG TOTAL) BY MOUTH DAILY AFTER BREAKFAST.   TURMERIC CURCUMIN PO Take 1 tablet by mouth daily.   valsartan  320 MG tablet Commonly known as: DIOVAN  Take 1 tablet (320 mg total) by mouth daily.          OBJECTIVE:   PHYSICAL EXAM: VS: BP 124/76 (BP Location: Left Arm, Patient Position: Sitting, Cuff Size: Large)   Pulse 90   Ht 5' 10 (1.778 m)   Wt 212 lb (96.2 kg)   SpO2 99%   BMI 30.42 kg/m    EXAM: General: Pt appears well and is in NAD  Eyes: External eye exam shows improved bilateral  proptosis  Neck: General: Supple without adenopathy. Thyroid : No goiter or nodules appreciated.   Lungs: Clear with good BS bilat   Heart: Auscultation: RRR.  Extremities:  BL LE: No pretibial edema   Mental Status: Judgment, insight: Intact Memory: Intact for recent and remote events Mood and affect: No depression, anxiety, or agitation     DATA REVIEWED:   Latest Reference Range & Units 12/05/23 11:00  TSH 0.40 - 4.50 mIU/L 1.21  T4,Free(Direct) 0.8 - 1.8 ng/dL 1.7    0/7979 TSI : 525 (H)      ASSESSMENT / PLAN / RECOMMENDATIONS:   Graves' disease, status post thyroidectomy followed by radioactive iodine  ablation:  -The patient is clinically euthyroid - No local neck symptoms  -TSH normal, no  change     Medications   Continue Levothyroxine  125 MCG daily  2. Graves' Orbitopathy :    - S/P radiation therapy, prednisone  therapy and recently sx on the right eye - Diplopia resolved - Continues to follow up at [facility]       Follow-up in 1 yr        Signed electronically by: Stefano Redgie Butts, MD  [facility]  [facility] [address] [place], [place] [address] Phone: [phone] FAX: [phone]      CC: Joshua Debby CROME, MD [address] [place] [address] Phone: [phone]  Fax: [phone]   Return to Endocrinology clinic as below: Future Appointments  Date Time Provider Department Center  01/09/2024  1:30 PM Daneen Damien BROCKS, NP [facility] None

## 2023-12-06 ENCOUNTER — Other Ambulatory Visit: Payer: Self-pay | Admitting: Internal Medicine

## 2023-12-06 DIAGNOSIS — E0500 Thyrotoxicosis with diffuse goiter without thyrotoxic crisis or storm: Secondary | ICD-10-CM

## 2023-12-06 DIAGNOSIS — E890 Postprocedural hypothyroidism: Secondary | ICD-10-CM

## 2023-12-06 LAB — T4, FREE: Free T4: 1.7 ng/dL (ref 0.8–1.8)

## 2023-12-06 LAB — TSH: TSH: 1.21 m[IU]/L (ref 0.40–4.50)

## 2023-12-07 MED ORDER — LEVOTHYROXINE SODIUM 125 MCG PO TABS
125.0000 ug | ORAL_TABLET | Freq: Every day | ORAL | 3 refills | Status: DC
Start: 2023-12-07 — End: 2024-01-31

## 2023-12-20 ENCOUNTER — Telehealth: Payer: Self-pay

## 2023-12-20 NOTE — Telephone Encounter (Signed)
Copied from CRM [id number]. Topic: Clinical - Prescription Issue >> Dec 20, 2023 12:28 PM Fuller Mandril wrote: Reason for CRM: Pharmacy [facility] called to request refills for all patient active medication. Will fax list of medications requested. Thank You

## 2023-12-21 ENCOUNTER — Other Ambulatory Visit: Payer: Self-pay | Admitting: Internal Medicine

## 2023-12-21 ENCOUNTER — Other Ambulatory Visit: Payer: Self-pay

## 2023-12-21 DIAGNOSIS — N1832 Chronic kidney disease, stage 3b: Secondary | ICD-10-CM

## 2023-12-21 DIAGNOSIS — E118 Type 2 diabetes mellitus with unspecified complications: Secondary | ICD-10-CM

## 2023-12-21 DIAGNOSIS — I10 Essential (primary) hypertension: Secondary | ICD-10-CM

## 2023-12-21 DIAGNOSIS — I119 Hypertensive heart disease without heart failure: Secondary | ICD-10-CM

## 2023-12-21 DIAGNOSIS — E1129 Type 2 diabetes mellitus with other diabetic kidney complication: Secondary | ICD-10-CM

## 2023-12-21 DIAGNOSIS — E1122 Type 2 diabetes mellitus with diabetic chronic kidney disease: Secondary | ICD-10-CM

## 2023-12-21 DIAGNOSIS — E785 Hyperlipidemia, unspecified: Secondary | ICD-10-CM

## 2023-12-21 MED ORDER — HYDRALAZINE HCL 25 MG PO TABS
25.0000 mg | ORAL_TABLET | Freq: Three times a day (TID) | ORAL | 0 refills | Status: DC
Start: 2023-12-21 — End: 2024-02-29

## 2023-12-21 MED ORDER — VALSARTAN 320 MG PO TABS: 320.0000 mg | ORAL_TABLET | Freq: Every day | ORAL | 0 refills | Status: DC

## 2023-12-21 MED ORDER — ATORVASTATIN CALCIUM 40 MG PO TABS
40.0000 mg | ORAL_TABLET | Freq: Every day | ORAL | 0 refills | Status: DC
Start: 2023-12-21 — End: 2024-04-01

## 2023-12-21 MED ORDER — DAPAGLIFLOZIN PROPANEDIOL 10 MG PO TABS
10.0000 mg | ORAL_TABLET | Freq: Every day | ORAL | 0 refills | Status: DC
Start: 2023-12-21 — End: 2023-12-21

## 2023-12-21 MED ORDER — KERENDIA 20 MG PO TABS
20.0000 mg | ORAL_TABLET | Freq: Every day | ORAL | 0 refills | Status: DC
Start: 2023-12-21 — End: 2024-04-24

## 2023-12-28 ENCOUNTER — Other Ambulatory Visit: Payer: Self-pay | Admitting: Internal Medicine

## 2024-01-03 ENCOUNTER — Other Ambulatory Visit: Payer: Self-pay | Admitting: Internal Medicine

## 2024-01-07 ENCOUNTER — Other Ambulatory Visit: Payer: Self-pay | Admitting: Internal Medicine

## 2024-01-07 DIAGNOSIS — J4520 Mild intermittent asthma, uncomplicated: Secondary | ICD-10-CM

## 2024-01-09 ENCOUNTER — Ambulatory Visit: Payer: BC Managed Care – PPO | Admitting: Nurse Practitioner

## 2024-01-10 NOTE — Progress Notes (Deleted)
 Cardiology Clinic Note   Patient Name: Ronald Bond Date of Encounter: 01/10/2024  Primary Care Provider:  [name], MD Primary Cardiologist:  Bryan Lemma, MD  Patient Profile    Ronald Bond 78 year old male presents to the clinic today for follow-up evaluation of his essential hypertension and PACs.  Past Medical History    Past Medical History:  Diagnosis Date   Allergy    Anemia    Asthma    Cataract    Chronic kidney disease    Diabetes mellitus without complication (HCC)    GERD (gastroesophageal reflux disease)    Glaucoma    HTN (hypertension)    Hyperlipidemia    Hypothyroidism    Osteoarthritis    Pneumonia    Pre-diabetes    Sleep apnea    no CPAP   Wears glasses    Past Surgical History:  Procedure Laterality Date   COLONOSCOPY     EYE SURGERY  2006   both cataracts   JOINT REPLACEMENT     MICROLARYNGOSCOPY Left 11/18/2013   Procedure: MICROLARYNGOSCOPY WITH REMOVAL OF GRANULOMA LEFT SIDE;  Surgeon: Serena Colonel, MD;  Location: [facility];  Service: ENT;  Laterality: Left;   NM MYOVIEW LTD  06/2007   Low risk, normal.  No evidence of ischemia or infarction   THYROIDECTOMY  1974   TONSILLECTOMY     as a child   TOTAL KNEE ARTHROPLASTY  2009   left   TOTAL KNEE ARTHROPLASTY Right 02/27/2023   Procedure: TOTAL KNEE ARTHROPLASTY;  Surgeon: Joen Laura, MD;  Location: [facility];  Service: Orthopedics;  Laterality: Right;   TOTAL KNEE REVISION Left 07/06/2022   Procedure: TOTAL KNEE REVISION;  Surgeon: Joen Laura, MD;  Location: [facility];  Service: Orthopedics;  Laterality: Left;    Allergies  Allergies  Allergen Reactions   Lisinopril Other (See Comments)    Edema in face    History of Present Illness    [name] has a PMH of HTN, PACs, allergic rhinitis, intermittent asthma, GERD, OSA, Graves' disease, type 2 diabetes, Graves' orbitopathy, chronic renal failure stage III, iron deficiency  anemia, obesity, and bradycardia.  He was seen on 09/17/2021 by Bailey Mech, DNP for evaluation of chest discomfort.  He has been advised to go to the emergency department 1014 for chest pain but did not go.  He had recently increased his carvedilol from 6.25 mg twice daily in conjunction with amlodipine and losartan.  His PCP discontinued carvedilol due to bradycardia.  He was noted to have sinus bradycardia despite having stopped carvedilol.  A cardiac event monitor was ordered.  He was able to climb stairs and walk around.  He was at the [facility] station and was able to ambulate without difficulty shortness of breath and dizziness.  His BP was well controlled.  His cardiac event monitor showed predominantly sinus rhythm with a heart rate of 36-137 on average of 66 bpm.  He was noted to have 28 episodes of PAT and atrial runs, occasional PACs and isolated rare PVCs.  He was seen in follow-up by Dr. Herbie Baltimore on 12/27/2021.  He felt much better since stopping his beta-blocker.  His energy level had improved.  He was very active at his job.  He did note some shortness of breath when going upstairs but did not notice any dyspnea with normal activities.  He denies chest pain or pressure.  He denied significant palpitations.  He denied  lightheadedness or dizziness.  He did note occasional episodes of squeezing this pinpoint pressure in the area of his right pec muscle.  This was made worse with specific movements.  He presented to the clinic 09/06/22 for follow-up evaluation and stated he felt well . He was recovering well from his left knee surgery.  We reviewed his chest discomfort.  He reported that he had occasional episodes of chest discomfort that lasted for around 30 minutes at a time.  His episodes were nonexertional.  His blood pressure was 136/62.  He reported higher blood pressures at home.  I have asked him to bring his blood pressure cuff when he returns in clinic.  He had also gained about 3 pounds  since May.  He reported that he had been recently at a wedding and enjoyed the food there.  He reported compliance with his medications and was tolerating them well.   Follow-up in 9 to 12 months was planned.   He was seen in follow-up by Dr. Herbie Baltimore on 10/30/23.  During that time he reported occasional episodes of mild chest discomfort on his right side which would occur at any time and did not worsen with exertion.  He denied palpitations, lower extremity swelling, and shortness of breath.  His blood pressure continued to be elevated at home.  He was started on amlodipine 5 mg daily and follow-up was planned for 2 months.  He presents to the clinic today for follow-up evaluation and states***.  Today he denies chest pain, shortness of breath, lower extremity edema, fatigue, palpitations, melena, hematuria, hemoptysis, diaphoresis, weakness, presyncope, syncope, orthopnea, and PND.   Home Medications    Prior to Admission medications   Medication Sig Start Date End Date Taking? Authorizing Provider  albuterol (VENTOLIN HFA) 108 (90 Base) MCG/ACT inhaler Inhale 2 puffs into the lungs every 6 (six) hours as needed for wheezing or shortness of breath. 07/05/21   [name], MD  amLODipine (NORVASC) 10 MG tablet Take 1 tablet (10 mg total) by mouth daily. 06/06/22   [name], MD  apixaban (ELIQUIS) 2.5 MG TABS tablet Take 1 tablet (2.5 mg total) by mouth 2 (two) times daily. 07/08/22   Joen Laura, MD  atorvastatin (LIPITOR) 40 MG tablet TAKE 1 TABLET BY MOUTH EVERY DAY 07/02/22   [name], MD  Blood Pressure Monitoring (BLOOD PRESSURE MONITOR/ARM) DEVI 1 Device by Does not apply route daily. USE DAILY TO  TAKE BLOOD PRESSURE READINGS 12/27/21   Marykay Lex, MD  cetirizine (ZYRTEC) 10 MG tablet Take 10 mg by mouth daily.    [provider]  chlorproMAZINE (THORAZINE) 25 MG tablet Take 1 tablet (25 mg total) by mouth 4 (four) times daily as needed. 07/08/22   [name], MD  cholecalciferol (VITAMIN D3) 25 MCG (1000 UNIT) tablet Take 1,000 Units by mouth daily.    [provider]  docusate sodium (COLACE) 100 MG capsule Take 100 mg by mouth 2 (two) times daily.    [provider]  FARXIGA 10 MG TABS tablet TAKE 1 TABLET BY MOUTH EVERY DAY BEFORE BREAKFAST 06/09/22   [name], MD  fluticasone (FLONASE) 50 MCG/ACT nasal spray Place 2 sprays into both nostrils daily. Patient taking differently: Place 2 sprays into both nostrils daily as needed. 10/15/18   [name], MD  hydrALAZINE (APRESOLINE) 25 MG tablet TAKE 1 TABLET BY MOUTH THREE TIMES A DAY 09/01/22   [name], MD  indapamide (LOZOL) 1.25 MG tablet TAKE 1 TABLET BY MOUTH EVERY DAY 06/26/22   [name], MD  KLOR-CON M20 20 MEQ tablet TAKE 1 TABLET BY MOUTH TWICE A DAY 08/12/22   [name], MD  levothyroxine (SYNTHROID) 112 MCG tablet Take 1 tablet (112 mcg total) by mouth daily. 08/31/22   Shamleffer, Konrad Dolores, MD  meloxicam (MOBIC) 7.5 MG tablet TAKE 1 TABLET BY MOUTH EVERY DAY 08/20/22   [name], MD  metoprolol tartrate (LOPRESSOR) 25 MG tablet Take 0.5 tablets (12.5 mg total) by mouth 2 (two) times daily. 12/29/21 07/06/22  Jodelle Gross, NP  Multiple Vitamin (MULTIVITAMIN) tablet Take 1 tablet by mouth daily.    [provider]  Multiple Vitamins-Minerals (ZINC PO) Take 1 tablet by mouth daily.    [provider]  omeprazole (PRILOSEC) 40 MG capsule TAKE 1 CAPSULE (40 MG TOTAL) BY MOUTH DAILY. 07/02/22   [name], MD  Polyethyl Glycol-Propyl Glycol 0.4-0.3 % SOLN Place 1 drop into both eyes as needed (dry eyes).    [provider]  tamsulosin (FLOMAX) 0.4 MG CAPS capsule TAKE 1 CAPSULE BY MOUTH EVERY DAY AFTER SUPPER 06/25/22   [name], MD  TURMERIC CURCUMIN PO Take 1 tablet by mouth daily.    [provider]  valsartan (DIOVAN) 320 MG tablet Take 1 tablet (320 mg total) by mouth daily.  12/27/21   Marykay Lex, MD  vitamin C (ASCORBIC ACID) 500 MG tablet Take 500 mg by mouth daily.    [provider]    Family History    Family History  Problem Relation Age of Onset   Brain cancer Mother    Heart disease Father    Heart disease Brother    He indicated that his mother is deceased. He indicated that his father is deceased. He indicated that his brother is deceased. He indicated that his maternal grandmother is deceased. He indicated that his maternal grandfather is deceased. He indicated that his paternal grandmother is deceased. He indicated that his paternal grandfather is deceased.  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  He states that he enjoys  the job a lot does a lot of walking and climbing stairs without significant discomfort.      He usually works ~2 weeks @ a time - stays in [employer] (paid for by [employer]) - & comes home for ~4 d weekends.   Social Drivers of Health   Financial Resource Strain: Low Risk  (12/26/2022)   Overall Financial Resource Strain (CARDIA)    Difficulty of  Paying Living Expenses: Not hard at all  Food Insecurity: No Food Insecurity (03/02/2023)   Hunger Vital Sign    Worried About Running  Out of Food in the Last Year: Never true    Ran Out of Food in the Last Year: Never true  Transportation Needs: No Transportation Needs (03/02/2023)   PRAPARE - Administrator, Civil Service (Medical): No    Lack of Transportation (Non-Medical): No  Physical Activity: Insufficiently Active (12/26/2022)   Exercise Vital Sign    Days of Exercise per Week: 7 days    Minutes of Exercise per Session: 10 min  Stress: No Stress Concern Present (12/26/2022)   [employer] of Occupational Health - Occupational Stress Questionnaire    Feeling of Stress : Not at all  Social Connections: Moderately Isolated (12/26/2022)   Social Connection and Isolation Panel [NHANES]    Frequency of Communication with Friends and Family: More than three times a week    Frequency of Social Gatherings with Friends and Family: More than three times a week    Attends Religious Services: Never    Database administrator or Organizations: No    Attends Banker Meetings: Never    Marital Status: Married  Catering manager Violence: Not At Risk (02/27/2023)   Humiliation, Afraid, Rape, and Kick questionnaire    Fear of Current or Ex-Partner: No    Emotionally Abused: No    Physically Abused: No    Sexually Abused: No     Review of Systems    General:  No chills, fever, night sweats or weight changes.  Cardiovascular:  No chest pain, dyspnea on exertion, edema, orthopnea, palpitations, paroxysmal nocturnal dyspnea. Dermatological: No rash, lesions/masses Respiratory: No cough, dyspnea Urologic: No hematuria, dysuria Abdominal:   No nausea, vomiting, diarrhea, bright red blood per rectum, melena, or hematemesis Neurologic:  No visual changes, wkns, changes in mental status. All other systems reviewed and are otherwise negative except as noted above.  Physical Exam    VS:  There were no vitals taken for this visit. , BMI There is no height or weight on file to calculate BMI. GEN: Well  nourished, well developed, in no acute distress. HEENT: normal. Neck: Supple, no JVD, carotid bruits, or masses. Cardiac: RRR, no murmurs, rubs, or gallops. No clubbing, cyanosis, edema.  Radials/DP/PT 2+ and equal bilaterally.  Respiratory:  Respirations regular and unlabored, clear to auscultation bilaterally. GI: Soft, nontender, nondistended, BS + x 4. MS: no deformity or atrophy. Skin: warm and dry, no rash. Neuro:  Strength and sensation are intact. Psych: Normal affect.  Accessory Clinical Findings    Recent Labs: 08/17/2023: ALT 18; BUN 19; Creatinine, Ser 1.50; Hemoglobin 13.3; Platelets 242; Potassium 3.6; Sodium 139 12/05/2023: TSH 1.21   Recent Lipid Panel    Component Value Date/Time   CHOL 165 08/30/2023 1424   CHOL 284 (H) 04/17/2015 1230   TRIG 100.0 08/30/2023 1424   TRIG 166 (H) 04/17/2015 1230   HDL 66.70 08/30/2023 1424   HDL 58 04/17/2015 1230   CHOLHDL 2 08/30/2023 1424   VLDL 20.0 08/30/2023 1424   LDLCALC 78 08/30/2023 1424   LDLCALC 193 (H) 04/17/2015 1230   LDLDIRECT 75.0 03/08/2016 1624    No BP recorded.  {Refresh Note OR Click here to enter BP  :1}***    ECG personally reviewed by me today-none today.  Cardiac event monitor 10/07/2021 7-day Predominantly sinus rhythm.:Heart rate range 36-137 bpm. Average 66 bpm. 28 episodes  of PAT/atrial runs: Fastest 6 beats-102 bpm, longest 13.1 seconds 126 beats. Occasional PACs (4.9%) with rare <1%) couplets and triplets. Rare isolated PVCs <1%) No sustained arrhythmias-tachycardic or bradycardic)   No significant findings.     Bryan Lemma, MD  Assessment & Plan   1.  Hypertension-BP today 136***/62.  Tolerating amlodipine well.   Previously did not tolerate carvedilol due to bradycardia.  Also noted fatigue.  Cardiac event monitor noted occasional PACs and isolated rare PVCs with 20 episodes of PAT. Maintain blood pressure log Continue valsartan, Lopressor, hydralazine, amlodipine Heart healthy  low-sodium diet-salty 6 reviewed Encouraged increased physical activity  PACs-denies recent episodes of palpitations.  No palpitations today.  Continues to notice occasional rare palpitations that are not that bothersome.  Was previously noted to have 4.9% PVCs on his previous cardiac event monitor. Maintain p.o. hydration,  Avoid caffeine chocolate EtOH dehydration etc.  Hyperlipidemia-LDL 84 on 06/06/22. 08/30/2023: Cholesterol 165; HDL 66.70; LDL Cholesterol 78; Triglycerides 100.0; VLDL 20.0 Continue atorvastatin, Farxiga Heart healthy low-sodium high-fiber diet Maintain physical activity  Bradycardia-previously noted to have bradycardia and fatigue with beta blocking agents.  Avoid beta-blocker/calcium channel blocker AV nodal agents.  Denies lightheadedness, presyncope or syncope.  Denies fatigue.  Heart rate today 8***2 bpm Continue to monitor  Obesity-weight today 209***lbs.  Working on increasing physical activity. Continue weight loss Calorie restricted diet Continue increase physical activity    Disposition: Follow-up with Dr. Herbie Baltimore on the 9-12 months.   Thomasene Ripple. Adra Shepler NP-C     01/10/2024, 12:14 PM [facility] [address] Office [phone] Fax [phone]  Notice: This dictation was prepared with Dragon dictation along with smaller phrase technology. Any transcriptional errors that result from this process are unintentional and may not be corrected upon review.  I spent 14*** minutes examining this patient, reviewing medications, and using patient centered shared decision making involving her cardiac care.  Prior to her visit I spent greater than 20 minutes reviewing her past medical history,  medications, and prior cardiac tests.

## 2024-01-11 ENCOUNTER — Other Ambulatory Visit (HOSPITAL_BASED_OUTPATIENT_CLINIC_OR_DEPARTMENT_OTHER): Payer: Self-pay

## 2024-01-11 MED ORDER — LATANOPROST 0.005 % OP SOLN
1.0000 [drp] | Freq: Every day | OPHTHALMIC | 6 refills | Status: AC
  Filled 2024-01-11: qty 2.5, 25d supply, fill #0
  Filled 2024-05-12: qty 2.5, 25d supply, fill #1
  Filled 2024-06-12 – 2024-07-09 (×4): qty 2.5, 25d supply, fill #2
  Filled 2024-07-31: qty 2.5, 25d supply, fill #3
  Filled 2024-08-29: qty 2.5, 25d supply, fill #4
  Filled 2024-11-21: qty 2.5, 25d supply, fill #5

## 2024-01-11 MED ORDER — DORZOLAMIDE HCL-TIMOLOL MAL 2-0.5 % OP SOLN
1.0000 [drp] | Freq: Two times a day (BID) | OPHTHALMIC | 6 refills | Status: AC
  Filled 2024-01-11: qty 10, 50d supply, fill #0
  Filled 2024-05-12 – 2024-05-27 (×2): qty 10, 30d supply, fill #1
  Filled 2024-06-29: qty 10, 30d supply, fill #2
  Filled 2024-07-30: qty 10, 30d supply, fill #3
  Filled 2024-08-28: qty 10, 30d supply, fill #4
  Filled 2024-11-21: qty 10, 30d supply, fill #5

## 2024-01-12 ENCOUNTER — Ambulatory Visit: Payer: Medicare (Managed Care) | Admitting: General Practice

## 2024-01-16 ENCOUNTER — Other Ambulatory Visit: Payer: Medicare (Managed Care)

## 2024-01-16 LAB — TSH: TSH: 0.42 m[IU]/L (ref 0.40–4.50)

## 2024-01-16 LAB — T4, FREE: Free T4: 1.8 ng/dL (ref 0.8–1.8)

## 2024-01-17 ENCOUNTER — Encounter: Payer: Self-pay | Admitting: Internal Medicine

## 2024-01-17 ENCOUNTER — Other Ambulatory Visit: Payer: Self-pay | Admitting: Internal Medicine

## 2024-01-17 DIAGNOSIS — N401 Enlarged prostate with lower urinary tract symptoms: Secondary | ICD-10-CM

## 2024-01-22 ENCOUNTER — Other Ambulatory Visit: Payer: Self-pay

## 2024-01-22 MED ORDER — METOPROLOL TARTRATE 25 MG PO TABS: ORAL_TABLET | ORAL | 3 refills | Status: DC

## 2024-01-24 ENCOUNTER — Other Ambulatory Visit: Payer: Self-pay

## 2024-01-31 ENCOUNTER — Other Ambulatory Visit: Payer: Self-pay

## 2024-01-31 ENCOUNTER — Telehealth: Payer: Self-pay | Admitting: Internal Medicine

## 2024-01-31 DIAGNOSIS — E0500 Thyrotoxicosis with diffuse goiter without thyrotoxic crisis or storm: Secondary | ICD-10-CM

## 2024-01-31 DIAGNOSIS — E890 Postprocedural hypothyroidism: Secondary | ICD-10-CM

## 2024-01-31 MED ORDER — LEVOTHYROXINE SODIUM 125 MCG PO TABS: 125.0000 ug | ORAL_TABLET | Freq: Every day | ORAL | 3 refills | Status: DC

## 2024-01-31 NOTE — Telephone Encounter (Signed)
 Unable to reach patient. LMTRC

## 2024-01-31 NOTE — Telephone Encounter (Signed)
 Copied from CRM [id number]. Topic: Clinical - Medication Refill >> Jan 31, 2024 10:41 AM Ernst Spell wrote: Most Recent Primary Care Visit:  Provider: [name]  Department: [facility]  Visit Type: HOSPITAL FU  Date: 08/30/2023  Medication: Marcelline Deist  Has the patient contacted their pharmacy? Yes Pharmacy called.   Is this the correct pharmacy for this prescription? Yes If no, delete pharmacy and type the correct one.   [facility] [place] [address] [place] [address] Phone: [phone] Fax: [phone]   Has the prescription been filled recently? Yes  Is the patient out of the medication? Yes  Has the patient been seen for an appointment in the last year OR does the patient have an upcoming appointment? No  Can we respond through MyChart? No  Agent: Please be advised that Rx refills may take up to 3 business days. We ask that you follow-up with your pharmacy.

## 2024-02-01 LAB — HM DIABETES EYE EXAM

## 2024-02-01 NOTE — Telephone Encounter (Signed)
 Unable to reach patient. LMTRC

## 2024-02-02 ENCOUNTER — Telehealth: Payer: Self-pay | Admitting: Internal Medicine

## 2024-02-02 ENCOUNTER — Other Ambulatory Visit: Payer: Self-pay

## 2024-02-02 ENCOUNTER — Encounter (HOSPITAL_BASED_OUTPATIENT_CLINIC_OR_DEPARTMENT_OTHER): Payer: Self-pay

## 2024-02-02 DIAGNOSIS — E118 Type 2 diabetes mellitus with unspecified complications: Secondary | ICD-10-CM

## 2024-02-02 DIAGNOSIS — R809 Proteinuria, unspecified: Secondary | ICD-10-CM

## 2024-02-02 DIAGNOSIS — N1832 Chronic kidney disease, stage 3b: Secondary | ICD-10-CM

## 2024-02-02 NOTE — Telephone Encounter (Signed)
 3rd and final attempt to reach patient. Unable to reach him and now I am unable to leave a message to return my call. MEDICATION HAS NOT BEEN REFILLED. I will be closing this telephone encounter at the time. If patient calls back please start a new encounter.

## 2024-02-02 NOTE — Telephone Encounter (Signed)
 Patient returned Jaz's call regarding a medication refill for [place]. He would like a call back at [phone].

## 2024-02-02 NOTE — Telephone Encounter (Signed)
 Spoke with patient . Medication refill has been sent to Dr. Yetta Barre

## 2024-02-03 ENCOUNTER — Other Ambulatory Visit: Payer: Self-pay | Admitting: Internal Medicine

## 2024-02-03 DIAGNOSIS — E785 Hyperlipidemia, unspecified: Secondary | ICD-10-CM

## 2024-02-04 NOTE — Progress Notes (Deleted)
 Cardiology Clinic Note   Patient Name: Ronald Bond Date of Encounter: 02/04/2024  Primary Care Provider:  [name], MD Primary Cardiologist:  Bryan Lemma, MD  Patient Profile    Ronald Bond 78 year old male presents to the clinic today for follow-up evaluation of his essential hypertension and PACs.  Past Medical History    Past Medical History:  Diagnosis Date   Allergy    Anemia    Asthma    Cataract    Chronic kidney disease    Diabetes mellitus without complication (HCC)    GERD (gastroesophageal reflux disease)    Glaucoma    HTN (hypertension)    Hyperlipidemia    Hypothyroidism    Osteoarthritis    Pneumonia    Pre-diabetes    Sleep apnea    no CPAP   Wears glasses    Past Surgical History:  Procedure Laterality Date   COLONOSCOPY     EYE SURGERY  2006   both cataracts   JOINT REPLACEMENT     MICROLARYNGOSCOPY Left 11/18/2013   Procedure: MICROLARYNGOSCOPY WITH REMOVAL OF GRANULOMA LEFT SIDE;  Surgeon: Serena Colonel, MD;  Location: [facility];  Service: ENT;  Laterality: Left;   NM MYOVIEW LTD  06/2007   Low risk, normal.  No evidence of ischemia or infarction   THYROIDECTOMY  1974   TONSILLECTOMY     as a child   TOTAL KNEE ARTHROPLASTY  2009   left   TOTAL KNEE ARTHROPLASTY Right 02/27/2023   Procedure: TOTAL KNEE ARTHROPLASTY;  Surgeon: Joen Laura, MD;  Location: [facility];  Service: Orthopedics;  Laterality: Right;   TOTAL KNEE REVISION Left 07/06/2022   Procedure: TOTAL KNEE REVISION;  Surgeon: Joen Laura, MD;  Location: [facility];  Service: Orthopedics;  Laterality: Left;    Allergies  Allergies  Allergen Reactions   Lisinopril Other (See Comments)    Edema in face    History of Present Illness    Ronald Bond has a PMH of HTN, PACs, allergic rhinitis, intermittent asthma, GERD, OSA, Graves' disease, type 2 diabetes, Graves' orbitopathy, chronic renal failure stage III, iron deficiency  anemia, obesity, and bradycardia.  He was seen on 09/17/2021 by Bailey Mech, DNP for evaluation of chest discomfort.  He has been advised to go to the emergency department 1014 for chest pain but did not go.  He had recently increased his carvedilol from 6.25 mg twice daily in conjunction with amlodipine and losartan.  His PCP discontinued carvedilol due to bradycardia.  He was noted to have sinus bradycardia despite having stopped carvedilol.  A cardiac event monitor was ordered.  He was able to climb stairs and walk around.  He was at the [facility] station and was able to ambulate without difficulty shortness of breath and dizziness.  His BP was well controlled.  His cardiac event monitor showed predominantly sinus rhythm with a heart rate of 36-137 on average of 66 bpm.  He was noted to have 28 episodes of PAT and atrial runs, occasional PACs and isolated rare PVCs.  He was seen in follow-up by Dr. Herbie Baltimore on 12/27/2021.  He felt much better since stopping his beta-blocker.  His energy level had improved.  He was very active at his job.  He did note some shortness of breath when going upstairs but did not notice any dyspnea with normal activities.  He denies chest pain or pressure.  He denied significant palpitations.  He denied  lightheadedness or dizziness.  He did note occasional episodes of squeezing this pinpoint pressure in the area of his right pec muscle.  This was made worse with specific movements.  He presented to the clinic 09/06/22 for follow-up evaluation and stated he felt well . He was recovering well from his left knee surgery.  We reviewed his chest discomfort.  He reported that he had occasional episodes of chest discomfort that lasted for around 30 minutes at a time.  His episodes were nonexertional.  His blood pressure was 136/62.  He reported higher blood pressures at home.  I have asked him to bring his blood pressure cuff when he returns in clinic.  He had also gained about 3 pounds  since May.  He reported that he had been recently at a wedding and enjoyed the food there.  He reported compliance with his medications and was tolerating them well.   Follow-up in 9 to 12 months was planned.   He was seen in follow-up by Dr. Herbie Baltimore on 10/30/23.  During that time he reported occasional episodes of mild chest discomfort on his right side which would occur at any time and did not worsen with exertion.  He denied palpitations, lower extremity swelling, and shortness of breath.  His blood pressure continued to be elevated at home.  He was started on amlodipine 5 mg daily and follow-up was planned for 2 months.  He presents to the clinic today for follow-up evaluation and states***.  Today he denies chest pain, shortness of breath, lower extremity edema, fatigue, palpitations, melena, hematuria, hemoptysis, diaphoresis, weakness, presyncope, syncope, orthopnea, and PND.   Home Medications    Prior to Admission medications   Medication Sig Start Date End Date Taking? Authorizing Provider  albuterol (VENTOLIN HFA) 108 (90 Base) MCG/ACT inhaler Inhale 2 puffs into the lungs every 6 (six) hours as needed for wheezing or shortness of breath. 07/05/21   [name], MD  amLODipine (NORVASC) 10 MG tablet Take 1 tablet (10 mg total) by mouth daily. 06/06/22   [name], MD  apixaban (ELIQUIS) 2.5 MG TABS tablet Take 1 tablet (2.5 mg total) by mouth 2 (two) times daily. 07/08/22   Joen Laura, MD  atorvastatin (LIPITOR) 40 MG tablet TAKE 1 TABLET BY MOUTH EVERY DAY 07/02/22   [name], MD  Blood Pressure Monitoring (BLOOD PRESSURE MONITOR/ARM) DEVI 1 Device by Does not apply route daily. USE DAILY TO  TAKE BLOOD PRESSURE READINGS 12/27/21   Marykay Lex, MD  cetirizine (ZYRTEC) 10 MG tablet Take 10 mg by mouth daily.    [provider]  chlorproMAZINE (THORAZINE) 25 MG tablet Take 1 tablet (25 mg total) by mouth 4 (four) times daily as needed. 07/08/22   [name], MD  cholecalciferol (VITAMIN D3) 25 MCG (1000 UNIT) tablet Take 1,000 Units by mouth daily.    [provider]  docusate sodium (COLACE) 100 MG capsule Take 100 mg by mouth 2 (two) times daily.    [provider]  FARXIGA 10 MG TABS tablet TAKE 1 TABLET BY MOUTH EVERY DAY BEFORE BREAKFAST 06/09/22   [name], MD  fluticasone (FLONASE) 50 MCG/ACT nasal spray Place 2 sprays into both nostrils daily. Patient taking differently: Place 2 sprays into both nostrils daily as needed. 10/15/18   [name], MD  hydrALAZINE (APRESOLINE) 25 MG tablet TAKE 1 TABLET BY MOUTH THREE TIMES A DAY 09/01/22   [name], MD  indapamide (LOZOL) 1.25 MG tablet TAKE 1 TABLET BY MOUTH EVERY DAY 06/26/22   [name], MD  KLOR-CON M20 20 MEQ tablet TAKE 1 TABLET BY MOUTH TWICE A DAY 08/12/22   [name], MD  levothyroxine (SYNTHROID) 112 MCG tablet Take 1 tablet (112 mcg total) by mouth daily. 08/31/22   Shamleffer, Konrad Dolores, MD  meloxicam (MOBIC) 7.5 MG tablet TAKE 1 TABLET BY MOUTH EVERY DAY 08/20/22   [name], MD  metoprolol tartrate (LOPRESSOR) 25 MG tablet Take 0.5 tablets (12.5 mg total) by mouth 2 (two) times daily. 12/29/21 07/06/22  Jodelle Gross, NP  Multiple Vitamin (MULTIVITAMIN) tablet Take 1 tablet by mouth daily.    [provider]  Multiple Vitamins-Minerals (ZINC PO) Take 1 tablet by mouth daily.    [provider]  omeprazole (PRILOSEC) 40 MG capsule TAKE 1 CAPSULE (40 MG TOTAL) BY MOUTH DAILY. 07/02/22   [name], MD  Polyethyl Glycol-Propyl Glycol 0.4-0.3 % SOLN Place 1 drop into both eyes as needed (dry eyes).    [provider]  tamsulosin (FLOMAX) 0.4 MG CAPS capsule TAKE 1 CAPSULE BY MOUTH EVERY DAY AFTER SUPPER 06/25/22   [name], MD  TURMERIC CURCUMIN PO Take 1 tablet by mouth daily.    [provider]  valsartan (DIOVAN) 320 MG tablet Take 1 tablet (320 mg total) by mouth daily.  12/27/21   Marykay Lex, MD  vitamin C (ASCORBIC ACID) 500 MG tablet Take 500 mg by mouth daily.    [provider]    Family History    Family History  Problem Relation Age of Onset   Brain cancer Mother    Heart disease Father    Heart disease Brother    He indicated that his mother is deceased. He indicated that his father is deceased. He indicated that his brother is deceased. He indicated that his maternal grandmother is deceased. He indicated that his maternal grandfather is deceased. He indicated that his paternal grandmother is deceased. He indicated that his paternal grandfather is deceased.  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  He states that he enjoys  the job a lot does a lot of walking and climbing stairs without significant discomfort.      He usually works ~2 weeks @ a time - stays in [employer] (paid for by [employer]) - & comes home for ~4 d weekends.   Social Drivers of Health   Financial Resource Strain: Low Risk  (12/26/2022)   Overall Financial Resource Strain (CARDIA)    Difficulty of  Paying Living Expenses: Not hard at all  Food Insecurity: No Food Insecurity (03/02/2023)   Hunger Vital Sign    Worried About Running  Out of Food in the Last Year: Never true    Ran Out of Food in the Last Year: Never true  Transportation Needs: No Transportation Needs (03/02/2023)   PRAPARE - Administrator, Civil Service (Medical): No    Lack of Transportation (Non-Medical): No  Physical Activity: Insufficiently Active (12/26/2022)   Exercise Vital Sign    Days of Exercise per Week: 7 days    Minutes of Exercise per Session: 10 min  Stress: No Stress Concern Present (12/26/2022)   [employer] of Occupational Health - Occupational Stress Questionnaire    Feeling of Stress : Not at all  Social Connections: Moderately Isolated (12/26/2022)   Social Connection and Isolation Panel [NHANES]    Frequency of Communication with Friends and Family: More than three times a week    Frequency of Social Gatherings with Friends and Family: More than three times a week    Attends Religious Services: Never    Database administrator or Organizations: No    Attends Banker Meetings: Never    Marital Status: Married  Catering manager Violence: Not At Risk (02/27/2023)   Humiliation, Afraid, Rape, and Kick questionnaire    Fear of Current or Ex-Partner: No    Emotionally Abused: No    Physically Abused: No    Sexually Abused: No     Review of Systems    General:  No chills, fever, night sweats or weight changes.  Cardiovascular:  No chest pain, dyspnea on exertion, edema, orthopnea, palpitations, paroxysmal nocturnal dyspnea. Dermatological: No rash, lesions/masses Respiratory: No cough, dyspnea Urologic: No hematuria, dysuria Abdominal:   No nausea, vomiting, diarrhea, bright red blood per rectum, melena, or hematemesis Neurologic:  No visual changes, wkns, changes in mental status. All other systems reviewed and are otherwise negative except as noted above.  Physical Exam    VS:  There were no vitals taken for this visit. , BMI There is no height or weight on file to calculate BMI. GEN: Well  nourished, well developed, in no acute distress. HEENT: normal. Neck: Supple, no JVD, carotid bruits, or masses. Cardiac: RRR, no murmurs, rubs, or gallops. No clubbing, cyanosis, edema.  Radials/DP/PT 2+ and equal bilaterally.  Respiratory:  Respirations regular and unlabored, clear to auscultation bilaterally. GI: Soft, nontender, nondistended, BS + x 4. MS: no deformity or atrophy. Skin: warm and dry, no rash. Neuro:  Strength and sensation are intact. Psych: Normal affect.  Accessory Clinical Findings    Recent Labs: 08/17/2023: ALT 18; BUN 19; Creatinine, Ser 1.50; Hemoglobin 13.3; Platelets 242; Potassium 3.6; Sodium 139 01/16/2024: TSH 0.42   Recent Lipid Panel    Component Value Date/Time   CHOL 165 08/30/2023 1424   CHOL 284 (H) 04/17/2015 1230   TRIG 100.0 08/30/2023 1424   TRIG 166 (H) 04/17/2015 1230   HDL 66.70 08/30/2023 1424   HDL 58 04/17/2015 1230   CHOLHDL 2 08/30/2023 1424   VLDL 20.0 08/30/2023 1424   LDLCALC 78 08/30/2023 1424   LDLCALC 193 (H) 04/17/2015 1230   LDLDIRECT 75.0 03/08/2016 1624    No BP recorded.  {Refresh Note OR Click here to enter BP  :1}***    ECG personally reviewed by me today-none today.  Cardiac event monitor 10/07/2021 7-day Predominantly sinus rhythm.:Heart rate range 36-137 bpm. Average 66 bpm. 28 episodes  of PAT/atrial runs: Fastest 6 beats-102 bpm, longest 13.1 seconds 126 beats. Occasional PACs (4.9%) with rare <1%) couplets and triplets. Rare isolated PVCs <1%) No sustained arrhythmias-tachycardic or bradycardic)   No significant findings.     Bryan Lemma, MD  Assessment & Plan   1.  Hypertension-BP today 136***/62.  Tolerating amlodipine well.   Previously did not tolerate carvedilol due to bradycardia.  Also noted fatigue.  Cardiac event monitor noted occasional PACs and isolated rare PVCs with 20 episodes of PAT. Maintain blood pressure log Continue valsartan, Lopressor, hydralazine, amlodipine Heart healthy  low-sodium diet-salty 6 reviewed Encouraged increased physical activity  PACs-denies recent episodes of palpitations.  No palpitations today.  Continues to notice occasional rare palpitations that are not that bothersome.  Was previously noted to have 4.9% PVCs on his previous cardiac event monitor. Maintain p.o. hydration,  Avoid caffeine chocolate EtOH dehydration etc.  Hyperlipidemia-LDL 84 on 06/06/22. 08/30/2023: Cholesterol 165; HDL 66.70; LDL Cholesterol 78; Triglycerides 100.0; VLDL 20.0 Continue atorvastatin, Farxiga Heart healthy low-sodium high-fiber diet Maintain physical activity  Bradycardia-previously noted to have bradycardia and fatigue with beta blocking agents.  Avoid beta-blocker/calcium channel blocker AV nodal agents.  Denies lightheadedness, presyncope or syncope.  Denies fatigue.  Heart rate today 8***2 bpm Continue to monitor  Obesity-weight today 209***lbs.  Working on increasing physical activity. Continue weight loss Calorie restricted diet Continue increase physical activity    Disposition: Follow-up with Dr. Herbie Baltimore on the 9-12 months.   Thomasene Ripple. Oline Belk NP-C     02/04/2024, 3:47 PM [facility] [address] Office [phone] Fax [phone]  Notice: This dictation was prepared with Dragon dictation along with smaller phrase technology. Any transcriptional errors that result from this process are unintentional and may not be corrected upon review.  I spent 14*** minutes examining this patient, reviewing medications, and using patient centered shared decision making involving her cardiac care.  Prior to her visit I spent greater than 20 minutes reviewing her past medical history,  medications, and prior cardiac tests.

## 2024-02-06 ENCOUNTER — Ambulatory Visit: Payer: Medicare (Managed Care) | Admitting: General Practice

## 2024-02-20 ENCOUNTER — Ambulatory Visit: Payer: Medicare (Managed Care) | Admitting: General Practice

## 2024-02-23 ENCOUNTER — Other Ambulatory Visit: Payer: Self-pay | Admitting: Internal Medicine

## 2024-02-23 DIAGNOSIS — E785 Hyperlipidemia, unspecified: Secondary | ICD-10-CM

## 2024-02-23 NOTE — Progress Notes (Signed)
 Cardiology Clinic Note   Patient Name: Ronald Bond Date of Encounter: 02/28/2024  Primary Care Provider:  [name], MD Primary Cardiologist:  Bryan Lemma, MD  Patient Profile    GEOVANY TRUDO 78 year old male presents to the clinic today for follow-up evaluation of his essential hypertension and PACs.  Past Medical History    Past Medical History:  Diagnosis Date   Allergy    Anemia    Asthma    Cataract    Chronic kidney disease    Diabetes mellitus without complication (HCC)    GERD (gastroesophageal reflux disease)    Glaucoma    HTN (hypertension)    Hyperlipidemia    Hypothyroidism    Osteoarthritis    Pneumonia    Pre-diabetes    Sleep apnea    no CPAP   Wears glasses    Past Surgical History:  Procedure Laterality Date   COLONOSCOPY     EYE SURGERY  2006   both cataracts   JOINT REPLACEMENT     MICROLARYNGOSCOPY Left 11/18/2013   Procedure: MICROLARYNGOSCOPY WITH REMOVAL OF GRANULOMA LEFT SIDE;  Surgeon: Serena Colonel, MD;  Location: [facility];  Service: ENT;  Laterality: Left;   NM MYOVIEW LTD  06/2007   Low risk, normal.  No evidence of ischemia or infarction   THYROIDECTOMY  1974   TONSILLECTOMY     as a child   TOTAL KNEE ARTHROPLASTY  2009   left   TOTAL KNEE ARTHROPLASTY Right 02/27/2023   Procedure: TOTAL KNEE ARTHROPLASTY;  Surgeon: Joen Laura, MD;  Location: [facility];  Service: Orthopedics;  Laterality: Right;   TOTAL KNEE REVISION Left 07/06/2022   Procedure: TOTAL KNEE REVISION;  Surgeon: Joen Laura, MD;  Location: [facility];  Service: Orthopedics;  Laterality: Left;    Allergies  Allergies  Allergen Reactions   Lisinopril Other (See Comments)    Edema in face    History of Present Illness    BIRAN MAYBERRY has a PMH of HTN, PACs, allergic rhinitis, intermittent asthma, GERD, OSA, Graves' disease, type 2 diabetes, Graves' orbitopathy, chronic renal failure stage III, iron deficiency  anemia, obesity, and bradycardia.  He was seen on 09/17/2021 by Bailey Mech, DNP for evaluation of chest discomfort.  He has been advised to go to the emergency department 1014 for chest pain but did not go.  He had recently increased his carvedilol from 6.25 mg twice daily in conjunction with amlodipine and losartan.  His PCP discontinued carvedilol due to bradycardia.  He was noted to have sinus bradycardia despite having stopped carvedilol.  A cardiac event monitor was ordered.  He was able to climb stairs and walk around.  He was at the [facility] station and was able to ambulate without difficulty shortness of breath and dizziness.  His BP was well controlled.  His cardiac event monitor showed predominantly sinus rhythm with a heart rate of 36-137 on average of 66 bpm.  He was noted to have 28 episodes of PAT and atrial runs, occasional PACs and isolated rare PVCs.  He was seen in follow-up by Dr. Herbie Baltimore on 12/27/2021.  He felt much better since stopping his beta-blocker.  His energy level had improved.  He was very active at his job.  He did note some shortness of breath when going upstairs but did not notice any dyspnea with normal activities.  He denies chest pain or pressure.  He denied significant palpitations.  He denied  lightheadedness or dizziness.  He did note occasional episodes of squeezing this pinpoint pressure in the area of his right pec muscle.  This was made worse with specific movements.  He presented to the clinic 09/06/22 for follow-up evaluation and stated he felt well . He was recovering well from his left knee surgery.  We reviewed his chest discomfort.  He reported that he had occasional episodes of chest discomfort that lasted for around 30 minutes at a time.  His episodes were nonexertional.  His blood pressure was 136/62.  He reported higher blood pressures at home.  I have asked him to bring his blood pressure cuff when he returns in clinic.  He had also gained about 3 pounds  since May.  He reported that he had been recently at a wedding and enjoyed the food there.  He reported compliance with his medications and was tolerating them well.   Follow-up in 9 to 12 months was planned.   He was seen in follow-up by Dr. Herbie Baltimore on 10/30/23.  During that time he reported occasional episodes of mild chest discomfort on his right side which would occur at any time and did not worsen with exertion.  He denied palpitations, lower extremity swelling, and shortness of breath.  His blood pressure continued to be elevated at home.  He was started on amlodipine 5 mg daily and follow-up was planned for 2 months.  He presents to the clinic today for follow-up evaluation and states he has been doing fairly well.  He has been checking his blood pressure prior to taking his medication.  Today's blood pressure is noted to be 130/70.  His pulse is 55.  He is tolerating amlodipine well.  He has been taking his hydralazine twice daily.  He notes that when he checks his blood pressure at home prior to taking his medication it is in the 170 systolic range.  He does not check it after he takes his medication.  He continues to try to cut back on his salt intake.  I will increase his amlodipine to 7.5 mg daily and plan follow-up in 6 to 8 months.  Of asked him to contact the office if his blood pressure reaches the 110 systolic range.  I will also give him the salty 6 diet she.  Today he denies chest pain, shortness of breath, lower extremity edema, fatigue, palpitations, melena, hematuria, hemoptysis, diaphoresis, weakness, presyncope, syncope, orthopnea, and PND.   Home Medications    Prior to Admission medications   Medication Sig Start Date End Date Taking? Authorizing Provider  albuterol (VENTOLIN HFA) 108 (90 Base) MCG/ACT inhaler Inhale 2 puffs into the lungs every 6 (six) hours as needed for wheezing or shortness of breath. 07/05/21   [name], MD  amLODipine (NORVASC) 10 MG tablet Take 1  tablet (10 mg total) by mouth daily. 06/06/22   [name], MD  apixaban (ELIQUIS) 2.5 MG TABS tablet Take 1 tablet (2.5 mg total) by mouth 2 (two) times daily. 07/08/22   Joen Laura, MD  atorvastatin (LIPITOR) 40 MG tablet TAKE 1 TABLET BY MOUTH EVERY DAY 07/02/22   [name], MD  Blood Pressure Monitoring (BLOOD PRESSURE MONITOR/ARM) DEVI 1 Device by Does not apply route daily. USE DAILY TO  TAKE BLOOD PRESSURE READINGS 12/27/21   Marykay Lex, MD  cetirizine (ZYRTEC) 10 MG tablet Take 10 mg by mouth daily.    [provider]  chlorproMAZINE (THORAZINE) 25 MG tablet Take  1 tablet (25 mg total) by mouth 4 (four) times daily as needed. 07/08/22   [name], MD  cholecalciferol (VITAMIN D3) 25 MCG (1000 UNIT) tablet Take 1,000 Units by mouth daily.    [provider]  docusate sodium (COLACE) 100 MG capsule Take 100 mg by mouth 2 (two) times daily.    [provider]  FARXIGA 10 MG TABS tablet TAKE 1 TABLET BY MOUTH EVERY DAY BEFORE BREAKFAST 06/09/22   [name], MD  fluticasone (FLONASE) 50 MCG/ACT nasal spray Place 2 sprays into both nostrils daily. Patient taking differently: Place 2 sprays into both nostrils daily as needed. 10/15/18   [name], MD  hydrALAZINE (APRESOLINE) 25 MG tablet TAKE 1 TABLET BY MOUTH THREE TIMES A DAY 09/01/22   [name], MD  indapamide (LOZOL) 1.25 MG tablet TAKE 1 TABLET BY MOUTH EVERY DAY 06/26/22   [name], MD  KLOR-CON M20 20 MEQ tablet TAKE 1 TABLET BY MOUTH TWICE A DAY 08/12/22   [name], MD  levothyroxine (SYNTHROID) 112 MCG tablet Take 1 tablet (112 mcg total) by mouth daily. 08/31/22   Shamleffer, Konrad Dolores, MD  meloxicam (MOBIC) 7.5 MG tablet TAKE 1 TABLET BY MOUTH EVERY DAY 08/20/22   [name], MD  metoprolol tartrate (LOPRESSOR) 25 MG tablet Take 0.5 tablets (12.5 mg total) by mouth 2 (two) times daily. 12/29/21 07/06/22  Jodelle Gross, NP  Multiple Vitamin  (MULTIVITAMIN) tablet Take 1 tablet by mouth daily.    [provider]  Multiple Vitamins-Minerals (ZINC PO) Take 1 tablet by mouth daily.    [provider]  omeprazole (PRILOSEC) 40 MG capsule TAKE 1 CAPSULE (40 MG TOTAL) BY MOUTH DAILY. 07/02/22   [name], MD  Polyethyl Glycol-Propyl Glycol 0.4-0.3 % SOLN Place 1 drop into both eyes as needed (dry eyes).    [provider]  tamsulosin (FLOMAX) 0.4 MG CAPS capsule TAKE 1 CAPSULE BY MOUTH EVERY DAY AFTER SUPPER 06/25/22   [name], MD  TURMERIC CURCUMIN PO Take 1 tablet by mouth daily.    [provider]  valsartan (DIOVAN) 320 MG tablet Take 1 tablet (320 mg total) by mouth daily. 12/27/21   Marykay Lex, MD  vitamin C (ASCORBIC ACID) 500 MG tablet Take 500 mg by mouth daily.    [provider]    Family History    Family History  Problem Relation Age of Onset   Brain cancer Mother    Heart disease Father    Heart disease Brother    He indicated that his mother is deceased. He indicated that his father is deceased. He indicated that his brother is deceased. He indicated that his maternal grandmother is deceased. He indicated that his maternal grandfather is deceased. He indicated that his paternal grandmother is deceased. He indicated that his paternal grandfather is deceased.  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  He states that he enjoys  the job a lot does a lot of walking and climbing stairs without significant discomfort.      He usually works ~2 weeks @ a time - stays in [employer] (paid for by [employer]) - & comes home for ~4 d weekends.   Social Drivers of Corporate investment banker Strain: Low Risk  (12/26/2022)   Overall Financial Resource Strain (CARDIA)    Difficulty of Paying Living Expenses: Not hard at all  Food Insecurity: No Food Insecurity (03/02/2023)   Hunger Vital Sign    Worried About Running Out of Food in the Last Year: Never true    Ran Out of Food in the Last Year: Never true  Transportation Needs: No Transportation Needs (03/02/2023)   PRAPARE - Administrator, Civil Service (Medical): No    Lack of Transportation (Non-Medical): No  Physical Activity: Insufficiently Active (12/26/2022)   Exercise Vital Sign    Days of Exercise per Week: 7 days    Minutes of Exercise per Session: 10 min  Stress: No Stress Concern Present (12/26/2022)   [employer] of Occupational Health - Occupational Stress Questionnaire    Feeling of Stress : Not at all  Social Connections: Moderately Isolated (12/26/2022)   Social Connection and Isolation Panel [NHANES]    Frequency of Communication with Friends and Family: More than three times a week    Frequency of Social Gatherings with Friends and Family: More than three times a week    Attends Religious Services: Never    Database administrator or Organizations: No    Attends Banker Meetings: Never    Marital Status: Married  Catering manager Violence: Not At Risk (02/27/2023)   Humiliation, Afraid, Rape, and Kick questionnaire    Fear of Current or Ex-Partner: No    Emotionally Abused: No    Physically Abused: No    Sexually Abused: No      Review of Systems    General:  No chills, fever, night sweats or weight changes.  Cardiovascular:  No chest pain, dyspnea on exertion, edema, orthopnea, palpitations, paroxysmal nocturnal dyspnea. Dermatological: No rash, lesions/masses Respiratory: No cough, dyspnea Urologic: No hematuria, dysuria Abdominal:   No nausea, vomiting, diarrhea, bright red blood per rectum, melena, or hematemesis Neurologic:  No visual changes, wkns, changes in mental status. All other systems reviewed and are otherwise negative except as noted above.  Physical Exam    VS:  BP 130/70 (BP Location: Left Arm, Patient Position: Sitting, Cuff Size: Normal)   Pulse (!) 55   Ht 5\' 10"  (1.778 m)   Wt 210 lb (95.3 kg)   BMI 30.13 kg/m  , BMI Body mass index is 30.13 kg/m. GEN: Well nourished, well developed, in no acute distress. HEENT: normal. Neck: Supple, no JVD, carotid bruits, or masses. Cardiac: RRR, no murmurs, rubs, or gallops. No clubbing, cyanosis, edema.  Radials/DP/PT 2+ and equal bilaterally.  Respiratory:  Respirations regular and unlabored, clear to auscultation bilaterally. GI: Soft, nontender, nondistended, BS + x 4. MS: no deformity or atrophy. Skin: warm and dry,  no rash. Neuro:  Strength and sensation are intact. Psych: Normal affect.  Accessory Clinical Findings    Recent Labs: 08/17/2023: ALT 18; BUN 19; Creatinine, Ser 1.50; Hemoglobin 13.3; Platelets 242; Potassium 3.6; Sodium 139 01/16/2024: TSH 0.42   Recent Lipid Panel    Component Value Date/Time   CHOL 165 08/30/2023 1424   CHOL 284 (H) 04/17/2015 1230   TRIG 100.0 08/30/2023 1424   TRIG 166 (H) 04/17/2015 1230   HDL 66.70 08/30/2023 1424   HDL 58 04/17/2015 1230   CHOLHDL 2 08/30/2023 1424   VLDL 20.0 08/30/2023 1424   LDLCALC 78 08/30/2023 1424   LDLCALC 193 (H) 04/17/2015 1230   LDLDIRECT 75.0 03/08/2016 1624         ECG personally reviewed by me today-none today.  Cardiac event monitor  10/07/2021 7-day Predominantly sinus rhythm.:Heart rate range 36-137 bpm. Average 66 bpm. 28 episodes of PAT/atrial runs: Fastest 6 beats-102 bpm, longest 13.1 seconds 126 beats. Occasional PACs (4.9%) with rare <1%) couplets and triplets. Rare isolated PVCs <1%) No sustained arrhythmias-tachycardic or bradycardic)   No significant findings.     Bryan Lemma, MD  Assessment & Plan   1.  Hypertension-BP today 130/70.  Tolerating amlodipine well.   Previously did not tolerate carvedilol due to bradycardia.  Also noted fatigue.  Cardiac event monitor noted occasional PACs and isolated rare PVCs with 20 episodes of PAT. has been checking blood pressure at home prior to taking medication.  Notices blood pressures in the 170 systolic range over 80 prior to medication.  Does note some head fullness. Maintain blood pressure log Continue valsartan, Lopressor, hydralazine,  Increase amlodipine to 7.5 mg daily Heart healthy low-sodium diet-salty 6 reviewed Encouraged increased physical activity  PACs-denies recent episodes of palpitations.  No palpitations today.    Was previously noted to have 4.9% PVCs on his previous cardiac event monitor. Maintain p.o. hydration,  Avoid caffeine chocolate EtOH dehydration etc.  Hyperlipidemia-LDL 84 on 06/06/22. 08/30/2023: Cholesterol 165; HDL 66.70; LDL Cholesterol 78; Triglycerides 100.0; VLDL 20.0 Continue atorvastatin, Farxiga Heart healthy low-sodium high-fiber diet Maintain physical activity  Bradycardia-previously noted to have bradycardia and fatigue with beta blocking agents.  Avoid beta-blocker/calcium channel blocker AV nodal agents.  Denies lightheadedness, presyncope or syncope.  Denies fatigue.  Heart rate today 55 bpm Continue to monitor  Obesity-weight today 210 lbs.  Working on increasing physical activity. Continue weight loss Calorie restricted diet Continue increase physical activity    Disposition: Follow-up with Dr. Herbie Baltimore on  the 6-8 months.   Thomasene Ripple. Noella Kipnis NP-C     02/28/2024, 8:54 AM [facility] [address] Office [phone] Fax [phone]  Notice: This dictation was prepared with Dragon dictation along with smaller phrase technology. Any transcriptional errors that result from this process are unintentional and may not be corrected upon review.  I spent 13 minutes examining this patient, reviewing medications, and using patient centered shared decision making involving her cardiac care.  Prior to her visit I spent greater than 20 minutes reviewing her past medical history,  medications, and prior cardiac tests.

## 2024-02-26 ENCOUNTER — Encounter (HOSPITAL_BASED_OUTPATIENT_CLINIC_OR_DEPARTMENT_OTHER): Payer: Self-pay

## 2024-02-26 ENCOUNTER — Other Ambulatory Visit: Payer: Self-pay | Admitting: Internal Medicine

## 2024-02-26 DIAGNOSIS — E890 Postprocedural hypothyroidism: Secondary | ICD-10-CM

## 2024-02-26 DIAGNOSIS — E785 Hyperlipidemia, unspecified: Secondary | ICD-10-CM

## 2024-02-26 DIAGNOSIS — E0500 Thyrotoxicosis with diffuse goiter without thyrotoxic crisis or storm: Secondary | ICD-10-CM

## 2024-02-26 NOTE — Telephone Encounter (Signed)
 Cancelled reorder, duplicate requests. Medications were refused 3 days ago, stating patient needs an appointment. Closing encounter.

## 2024-02-26 NOTE — Telephone Encounter (Signed)
 Copied from CRM [id number]. Topic: Clinical - Medication Refill >> Feb 26, 2024 11:24 AM Florestine Avers wrote: Most Recent Primary Care Visit:  Provider: [name]  Department: [facility]  Visit Type: HOSPITAL FU  Date: 08/30/2023  Medication: valsartan (DIOVAN) 320 MG tablet, atorvastatin (LIPITOR) 40 MG tablet,levothyroxine (SYNTHROID) 125 MCG tablet, Docusate 10mg   Has the patient contacted their pharmacy? Yes (Agent: If no, request that the patient contact the pharmacy for the refill. If patient does not wish to contact the pharmacy document the reason why and proceed with request.) (Agent: If yes, when and what did the pharmacy advise?)  Is this the correct pharmacy for this prescription? Yes If no, delete pharmacy and type the correct one.  This is the patient's preferred pharmacy:  [id number] [place], [place] - [address], [address] AT [facility] [address], [address] [place] [address] Phone: [phone] Fax: [phone]  [id number] [address] [place] - [address] [place] [address] Phone: [phone] Fax: [phone]  [facility] [place], [place] - [address] [place] [address] Phone: [phone] Fax: [phone]  [facility] - [place], [place] - [address] [place] [address] Phone: [phone] Fax: [phone]   Has the prescription been filled recently? No  Is the patient out of the medication? Yes  Has the patient been seen for an appointment in the last year OR does the patient have an upcoming appointment? Yes  Can we respond through MyChart? Yes  Agent: Please be advised that Rx refills may take up to 3 business days. We ask that you follow-up with your pharmacy.

## 2024-02-28 ENCOUNTER — Ambulatory Visit: Payer: Medicare (Managed Care) | Attending: General Practice | Admitting: General Practice

## 2024-02-28 ENCOUNTER — Encounter: Payer: Self-pay | Admitting: General Practice

## 2024-02-28 VITALS — BP 130/70 | HR 55 | Ht 70.0 in | Wt 210.0 lb

## 2024-02-28 DIAGNOSIS — R001 Bradycardia, unspecified: Secondary | ICD-10-CM | POA: Diagnosis not present

## 2024-02-28 DIAGNOSIS — E1169 Type 2 diabetes mellitus with other specified complication: Secondary | ICD-10-CM

## 2024-02-28 DIAGNOSIS — I119 Hypertensive heart disease without heart failure: Secondary | ICD-10-CM | POA: Diagnosis not present

## 2024-02-28 DIAGNOSIS — E785 Hyperlipidemia, unspecified: Secondary | ICD-10-CM

## 2024-02-28 DIAGNOSIS — I491 Atrial premature depolarization: Secondary | ICD-10-CM | POA: Diagnosis not present

## 2024-02-28 DIAGNOSIS — E66811 Obesity, class 1: Secondary | ICD-10-CM

## 2024-02-28 MED ORDER — AMLODIPINE BESYLATE 5 MG PO TABS: 7.5000 mg | ORAL_TABLET | Freq: Every day | ORAL | 9 refills | Status: DC

## 2024-02-28 NOTE — Patient Instructions (Addendum)
 Medication Instructions:  INCREASE AMLODIPINE 7.5MG -1-1/2 TAB DAILY *If you need a refill on your cardiac medications before your next appointment, please call your pharmacy*  Lab Work: NONE  Follow-Up: At [facility], you and your health needs are our priority.  As part of our continuing mission to provide you with exceptional heart care, our providers are all part of one team.  This team includes your primary Cardiologist (physician) and Advanced Practice Providers or APPs (Physician Assistants and Nurse Practitioners) who all work together to provide you with the care you need, when you need it.  Your next appointment:   6-8 month(s)  Provider:   Bryan Lemma, MD    Other Instructions PLEASE READ AND FOLLOW ATTACHED  SALTY 6 MAINTAIN YOUR PHYSICAL ACTIVITY            1st Floor: - Lobby - Registration  - Pharmacy  - Lab - Cafe  2nd Floor: - PV Lab - Diagnostic Testing (echo, CT, nuclear med)  3rd Floor: - Vacant  4th Floor: - TCTS (cardiothoracic surgery) - AFib Clinic - Structural Heart Clinic - Vascular Surgery  - Vascular Ultrasound  5th Floor: - HeartCare Cardiology (general and EP) - Clinical Pharmacy for coumadin, hypertension, lipid, weight-loss medications, and med management appointments    Valet parking services will be available as well.

## 2024-02-29 ENCOUNTER — Other Ambulatory Visit: Payer: Self-pay | Admitting: Internal Medicine

## 2024-02-29 DIAGNOSIS — K2100 Gastro-esophageal reflux disease with esophagitis, without bleeding: Secondary | ICD-10-CM

## 2024-02-29 DIAGNOSIS — I119 Hypertensive heart disease without heart failure: Secondary | ICD-10-CM

## 2024-02-29 DIAGNOSIS — I10 Essential (primary) hypertension: Secondary | ICD-10-CM

## 2024-03-01 ENCOUNTER — Other Ambulatory Visit (HOSPITAL_COMMUNITY): Payer: Self-pay

## 2024-03-01 ENCOUNTER — Telehealth: Payer: Self-pay | Admitting: Pharmacy Technician

## 2024-03-01 NOTE — Telephone Encounter (Signed)
 Pharmacy Patient Advocate Encounter   Received notification from CoverMyMeds that prior authorization for Kerendia 20MG  tablets is required/requested.   Insurance verification completed.   The patient is insured through [employer] .   Per test claim: PA required; PA submitted to above mentioned insurance via CoverMyMeds Key/confirmation #/[facility] Status is pending

## 2024-03-01 NOTE — Telephone Encounter (Signed)
 Pharmacy Patient Advocate Encounter  Received notification from [employer] that Prior Authorization for Kerendia 20MG  tablets has been APPROVED from 03/01/2024 to 03/01/2025. Ran test claim, Copay is $47.00. This test claim was processed through [facility]- copay amounts may vary at other pharmacies due to pharmacy/plan contracts, or as the patient moves through the different stages of their insurance plan.   PA #/Case ID/Reference #: [id number]

## 2024-03-20 ENCOUNTER — Telehealth: Payer: Self-pay

## 2024-03-20 NOTE — Telephone Encounter (Signed)
 Copied from CRM [id number]. Topic: Clinical - Medical Advice >> Mar 20, 2024 10:29 AM Adonis Hoot wrote: Reason for CRM: Patient would like to know is he able to have a hearing test,audiogram,thyroid  eye disease EO5.00,TS stimulating done  in office with provider? These are things that [facility] [place]. If not where would he go to have these things done?

## 2024-03-22 ENCOUNTER — Ambulatory Visit: Payer: Self-pay

## 2024-03-22 NOTE — Telephone Encounter (Signed)
  Chief Complaint: Eye pain  - pt needs referral to Audiologist so that [facility] can treat right eye. Symptoms: pain - ongoing Frequency: years Pertinent Negatives: Patient denies  Disposition: [] ED /[] Urgent Care (no appt availability in office) / [] Appointment(In office/virtual)/ []  [facility]/ [] Home Care/ [] Refused Recommended Disposition /[] [place] [facility]/ [x]  Follow-up with PCP Additional Notes: Pt was calling for referral to Audiologist so that eye can be treated at [facility] in [place].  Pt states that there is an appt availably with [facility] for 5/ 11/2023 in [place].  Practice is at [facility]. Pt would like to be able to go to that appt so [facility] eye can treat eye asap.  Pt will go to any facility for care, just wants to be seen asap.    Copied from CRM [id number]. Topic: Clinical - Red Word Triage >> Mar 22, 2024 10:14 AM Juluis Ok wrote: [facility] that prompted transfer to Nurse Triage: Rt eye pain-thyroid  eye disease Answer Assessment - Initial Assessment Questions 1. ONSET: "When did the pain start?" (e.g., minutes, hours, days)     ongoing 2. TIMING: "Does the pain come and go, or has it been constant since it started?" (e.g., constant, intermittent, fleeting)     3 years 3. SEVERITY: "How bad is the pain?"   (Scale 1-10; mild, moderate or severe)   - MILD (1-3): doesn't interfere with normal activities    - MODERATE (4-7): interferes with normal activities or awakens from sleep    - SEVERE (8-10): excruciating pain and patient unable to do normal activities     moderate  Protocols used: Eye Pain and Other Symptoms-A-AH

## 2024-03-25 ENCOUNTER — Other Ambulatory Visit: Payer: Self-pay | Admitting: Internal Medicine

## 2024-03-25 DIAGNOSIS — E785 Hyperlipidemia, unspecified: Secondary | ICD-10-CM

## 2024-03-26 ENCOUNTER — Telehealth: Payer: Self-pay | Admitting: Internal Medicine

## 2024-03-26 NOTE — Telephone Encounter (Signed)
 Copied from CRM [id number]. Topic: Referral - Status >> Mar 26, 2024 10:19 AM Lotus Round B wrote: Reason for CRM: pt called in to see what is going on with the referral status to a Audiologist so that [facility] can treat right eye. He is very concerned on what is going on because he hasn't heard anything from anyone and would like someone to give him a call .

## 2024-03-28 ENCOUNTER — Telehealth: Payer: Self-pay | Admitting: Internal Medicine

## 2024-03-28 NOTE — Telephone Encounter (Signed)
 Copied from CRM [id number]. Topic: Clinical - Medication Question >> Mar 28, 2024 12:17 PM Marlan Silva wrote: Reason for CRM: Ardelia Beau calling from [place] making an adherence call. Patient would like a 90 day supply for his medications Valsartan  320 mg, and Propan tablet 10 mg.    Good call back number is [phone] anyone can help, rx's will go to [facility] on file.

## 2024-03-29 NOTE — Telephone Encounter (Signed)
 Is this for the eye or the ear?

## 2024-03-29 NOTE — Telephone Encounter (Signed)
 Duplicate

## 2024-03-29 NOTE — Telephone Encounter (Signed)
 Patient hasn't seen you since 08/2023 are these refills appropriate.

## 2024-03-29 NOTE — Telephone Encounter (Signed)
**Note De-identified  [place] Obfuscation** Please advise 

## 2024-04-01 ENCOUNTER — Other Ambulatory Visit: Payer: Self-pay | Admitting: Internal Medicine

## 2024-04-01 DIAGNOSIS — N401 Enlarged prostate with lower urinary tract symptoms: Secondary | ICD-10-CM

## 2024-04-01 DIAGNOSIS — E785 Hyperlipidemia, unspecified: Secondary | ICD-10-CM

## 2024-04-01 NOTE — Telephone Encounter (Signed)
 Copied from CRM [id number]. Topic: Clinical - Medication Refill >> Apr 01, 2024 10:31 AM Jenice Mitts wrote: Most Recent Primary Care Visit:  Provider: Arcadio Knuckles  Department: [facility]  Visit Type: HOSPITAL FU  Date: 08/30/2023  Medication: valsartan  (DIOVAN ) 320 MG tablet, atorvastatin  (LIPITOR) 40 MG tablet,  cetirizine (ZYRTEC) 10 MG tablet  Has the patient contacted their pharmacy? Yes (Agent: If no, request that the patient contact the pharmacy for the refill. If patient does not wish to contact the pharmacy document the reason why and proceed with request.) (Agent: If yes, when and what did the pharmacy advise?)  Is this the correct pharmacy for this prescription? Yes If no, delete pharmacy and type the correct one.  This is the patient's preferred pharmacy:   [facility] [place] - [place], [place] - [address] [place] [address] Phone: [phone] Fax: [phone]   Has the prescription been filled recently? No  Is the patient out of the medication? No  Has the patient been seen for an appointment in the last year OR does the patient have an upcoming appointment? Yes  Can we respond through MyChart? Yes  Agent: Please be advised that Rx refills may take up to 3 business days. We ask that you follow-up with your pharmacy.

## 2024-04-01 NOTE — Telephone Encounter (Signed)
 This is for ENT patient states that's "[facility]" in [place] what's him to see ENT before they can do anything about his eye.

## 2024-04-01 NOTE — Telephone Encounter (Signed)
 The pharmacy team has been made aware that we are not sending any medication in until he is seen. They stated they will reach out to him.

## 2024-04-04 MED ORDER — VALSARTAN 320 MG PO TABS: 320.0000 mg | ORAL_TABLET | Freq: Every day | ORAL | 0 refills | Status: DC

## 2024-04-04 MED ORDER — ATORVASTATIN CALCIUM 40 MG PO TABS: 40.0000 mg | ORAL_TABLET | Freq: Every day | ORAL | 0 refills | Status: DC

## 2024-04-05 ENCOUNTER — Encounter (HOSPITAL_COMMUNITY): Payer: Self-pay

## 2024-04-05 NOTE — Telephone Encounter (Signed)
 Patient has been made aware. And he will handle this at his appointment on the 28th

## 2024-04-12 ENCOUNTER — Ambulatory Visit: Payer: Medicare (Managed Care)

## 2024-04-23 ENCOUNTER — Other Ambulatory Visit: Payer: Self-pay | Admitting: Internal Medicine

## 2024-04-23 DIAGNOSIS — N401 Enlarged prostate with lower urinary tract symptoms: Secondary | ICD-10-CM

## 2024-04-24 ENCOUNTER — Ambulatory Visit: Payer: Medicare (Managed Care) | Admitting: Internal Medicine

## 2024-04-24 ENCOUNTER — Ambulatory Visit: Payer: Medicare (Managed Care) | Attending: Internal Medicine

## 2024-04-24 ENCOUNTER — Ambulatory Visit: Payer: Self-pay | Admitting: Internal Medicine

## 2024-04-24 ENCOUNTER — Encounter: Payer: Self-pay | Admitting: Internal Medicine

## 2024-04-24 VITALS — BP 172/86 | HR 44 | Temp 97.8°F | Resp 16 | Ht 70.0 in | Wt 213.4 lb

## 2024-04-24 DIAGNOSIS — E890 Postprocedural hypothyroidism: Secondary | ICD-10-CM | POA: Diagnosis not present

## 2024-04-24 DIAGNOSIS — E785 Hyperlipidemia, unspecified: Secondary | ICD-10-CM | POA: Diagnosis not present

## 2024-04-24 DIAGNOSIS — R001 Bradycardia, unspecified: Secondary | ICD-10-CM

## 2024-04-24 DIAGNOSIS — E1169 Type 2 diabetes mellitus with other specified complication: Secondary | ICD-10-CM

## 2024-04-24 DIAGNOSIS — Z0001 Encounter for general adult medical examination with abnormal findings: Secondary | ICD-10-CM

## 2024-04-24 DIAGNOSIS — E1122 Type 2 diabetes mellitus with diabetic chronic kidney disease: Secondary | ICD-10-CM

## 2024-04-24 DIAGNOSIS — D508 Other iron deficiency anemias: Secondary | ICD-10-CM

## 2024-04-24 DIAGNOSIS — I10 Essential (primary) hypertension: Secondary | ICD-10-CM | POA: Diagnosis not present

## 2024-04-24 DIAGNOSIS — I119 Hypertensive heart disease without heart failure: Secondary | ICD-10-CM

## 2024-04-24 DIAGNOSIS — N1832 Chronic kidney disease, stage 3b: Secondary | ICD-10-CM

## 2024-04-24 DIAGNOSIS — I517 Cardiomegaly: Secondary | ICD-10-CM

## 2024-04-24 DIAGNOSIS — H903 Sensorineural hearing loss, bilateral: Secondary | ICD-10-CM | POA: Diagnosis not present

## 2024-04-24 DIAGNOSIS — R809 Proteinuria, unspecified: Secondary | ICD-10-CM

## 2024-04-24 DIAGNOSIS — E519 Thiamine deficiency, unspecified: Secondary | ICD-10-CM

## 2024-04-24 LAB — BASIC METABOLIC PANEL WITH GFR
BUN: 18 mg/dL (ref 6–23)
CO2: 28 meq/L (ref 19–32)
Calcium: 9.3 mg/dL (ref 8.4–10.5)
Chloride: 104 meq/L (ref 96–112)
Creatinine, Ser: 1.56 mg/dL — ABNORMAL HIGH (ref 0.40–1.50)
GFR: 42.31 mL/min — ABNORMAL LOW (ref >60.00)
Glucose, Bld: 109 mg/dL — ABNORMAL HIGH (ref 70–99)
Potassium: 4 meq/L (ref 3.5–5.1)
Sodium: 139 meq/L (ref 135–145)

## 2024-04-24 LAB — CBC WITH DIFFERENTIAL/PLATELET
Basophils Absolute: 0.1 10*3/uL (ref 0.0–0.1)
Basophils Relative: 1 % (ref 0.0–3.0)
Eosinophils Absolute: 0.3 10*3/uL (ref 0.0–0.7)
Eosinophils Relative: 3.6 % (ref 0.0–5.0)
HCT: 37.9 % — ABNORMAL LOW (ref 39.0–52.0)
Hemoglobin: 12.5 g/dL — ABNORMAL LOW (ref 13.0–17.0)
Lymphocytes Relative: 11.1 % — ABNORMAL LOW (ref 12.0–46.0)
Lymphs Abs: 0.8 10*3/uL (ref 0.7–4.0)
MCHC: 33 g/dL (ref 30.0–36.0)
MCV: 90.7 fl (ref 78.0–100.0)
Monocytes Absolute: 0.8 10*3/uL (ref 0.1–1.0)
Monocytes Relative: 10.2 % (ref 3.0–12.0)
Neutro Abs: 5.5 10*3/uL (ref 1.4–7.7)
Neutrophils Relative %: 74.1 % (ref 43.0–77.0)
Platelets: 297 10*3/uL (ref 150.0–400.0)
RBC: 4.18 Mil/uL — ABNORMAL LOW (ref 4.22–5.81)
RDW: 16.1 % — ABNORMAL HIGH (ref 11.5–15.5)
WBC: 7.4 10*3/uL (ref 4.0–10.5)

## 2024-04-24 LAB — IBC + FERRITIN
Ferritin: 48.7 ng/mL (ref 22.0–322.0)
Iron: 62 ug/dL (ref 42–165)
Saturation Ratios: 16.1 % — ABNORMAL LOW (ref 20.0–50.0)
TIBC: 385 ug/dL (ref 250.0–450.0)
Transferrin: 275 mg/dL (ref 212.0–360.0)

## 2024-04-24 LAB — URINALYSIS, ROUTINE W REFLEX MICROSCOPIC
Bilirubin Urine: NEGATIVE
Hgb urine dipstick: NEGATIVE
Ketones, ur: NEGATIVE
Leukocytes,Ua: NEGATIVE
Nitrite: NEGATIVE
RBC / HPF: NONE SEEN (ref 0–?)
Specific Gravity, Urine: 1.01 (ref 1.000–1.030)
Total Protein, Urine: 30 — AB
Urine Glucose: >=1000 — AB
Urobilinogen, UA: 0.2 (ref 0.0–1.0)
pH: 6.5 (ref 5.0–8.0)

## 2024-04-24 LAB — MICROALBUMIN / CREATININE URINE RATIO
Creatinine,U: 61.3 mg/dL
Microalb Creat Ratio: 244.4 mg/g — ABNORMAL HIGH (ref 0.0–30.0)
Microalb, Ur: 15 mg/dL — ABNORMAL HIGH (ref 0.0–1.9)

## 2024-04-24 LAB — LIPID PANEL
Cholesterol: 149 mg/dL (ref 0–200)
HDL: 59.3 mg/dL (ref >39.00)
LDL Cholesterol: 70 mg/dL (ref 0–99)
NonHDL: 89.81
Total CHOL/HDL Ratio: 3
Triglycerides: 100 mg/dL (ref 0.0–149.0)
VLDL: 20 mg/dL (ref 0.0–40.0)

## 2024-04-24 LAB — HEPATIC FUNCTION PANEL
ALT: 11 U/L (ref 0–53)
AST: 13 U/L (ref 0–37)
Albumin: 4.5 g/dL (ref 3.5–5.2)
Alkaline Phosphatase: 79 U/L (ref 39–117)
Bilirubin, Direct: 0.1 mg/dL (ref 0.0–0.3)
Total Bilirubin: 0.5 mg/dL (ref 0.2–1.2)
Total Protein: 7.8 g/dL (ref 6.0–8.3)

## 2024-04-24 LAB — TSH: TSH: 3.74 u[IU]/mL (ref 0.35–5.50)

## 2024-04-24 LAB — HEMOGLOBIN A1C: Hgb A1c MFr Bld: 6.2 % (ref 4.6–6.5)

## 2024-04-24 MED ORDER — HYDRALAZINE HCL 50 MG PO TABS: 50.0000 mg | ORAL_TABLET | Freq: Three times a day (TID) | ORAL | 0 refills | Status: DC

## 2024-04-24 MED ORDER — VITAMIN B-1 50 MG PO TABS: 50.0000 mg | ORAL_TABLET | Freq: Every day | ORAL | 0 refills | Status: DC

## 2024-04-24 MED ORDER — KERENDIA 20 MG PO TABS
20.0000 mg | ORAL_TABLET | Freq: Every day | ORAL | 0 refills | Status: DC
Start: 2024-04-24 — End: 2024-09-02

## 2024-04-24 NOTE — Progress Notes (Unsigned)
 EP to read.

## 2024-04-24 NOTE — Patient Instructions (Signed)
 Health Maintenance, Male  Adopting a healthy lifestyle and getting preventive care are important in promoting health and wellness. Ask your health care provider about:  The right schedule for you to have regular tests and exams.  Things you can do on your own to prevent diseases and keep yourself healthy.  What should I know about diet, weight, and exercise?  Eat a healthy diet    Eat a diet that includes plenty of vegetables, fruits, low-fat dairy products, and lean protein.  Do not eat a lot of foods that are high in solid fats, added sugars, or sodium.  Maintain a healthy weight  Body mass index (BMI) is a measurement that can be used to identify possible weight problems. It estimates body fat based on height and weight. Your health care provider can help determine your BMI and help you achieve or maintain a healthy weight.  Get regular exercise  Get regular exercise. This is one of the most important things you can do for your health. Most adults should:  Exercise for at least 150 minutes each week. The exercise should increase your heart rate and make you sweat (moderate-intensity exercise).  Do strengthening exercises at least twice a week. This is in addition to the moderate-intensity exercise.  Spend less time sitting. Even light physical activity can be beneficial.  Watch cholesterol and blood lipids  Have your blood tested for lipids and cholesterol at 78 years of age, then have this test every 5 years.  You may need to have your cholesterol levels checked more often if:  Your lipid or cholesterol levels are high.  You are older than 78 years of age.  You are at high risk for heart disease.  What should I know about cancer screening?  Many types of cancers can be detected early and may often be prevented. Depending on your health history and family history, you may need to have cancer screening at various ages. This may include screening for:  Colorectal cancer.  Prostate cancer.  Skin cancer.  Lung  cancer.  What should I know about heart disease, diabetes, and high blood pressure?  Blood pressure and heart disease  High blood pressure causes heart disease and increases the risk of stroke. This is more likely to develop in people who have high blood pressure readings or are overweight.  Talk with your health care provider about your target blood pressure readings.  Have your blood pressure checked:  Every 3-5 years if you are 9-95 years of age.  Every year if you are 85 years old or older.  If you are between the ages of 29 and 29 and are a current or former smoker, ask your health care provider if you should have a one-time screening for abdominal aortic aneurysm (AAA).  Diabetes  Have regular diabetes screenings. This checks your fasting blood sugar level. Have the screening done:  Once every three years after age 23 if you are at a normal weight and have a low risk for diabetes.  More often and at a younger age if you are overweight or have a high risk for diabetes.  What should I know about preventing infection?  Hepatitis B  If you have a higher risk for hepatitis B, you should be screened for this virus. Talk with your health care provider to find out if you are at risk for hepatitis B infection.  Hepatitis C  Blood testing is recommended for:  Everyone born from [birth year] through 1965.  Anyone  with known risk factors for hepatitis C.  Sexually transmitted infections (STIs)  You should be screened each year for STIs, including gonorrhea and chlamydia, if:  You are sexually active and are younger than 78 years of age.  You are older than 78 years of age and your health care provider tells you that you are at risk for this type of infection.  Your sexual activity has changed since you were last screened, and you are at increased risk for chlamydia or gonorrhea. Ask your health care provider if you are at risk.  Ask your health care provider about whether you are at high risk for HIV. Your health care provider  may recommend a prescription medicine to help prevent HIV infection. If you choose to take medicine to prevent HIV, you should first get tested for HIV. You should then be tested every 3 months for as long as you are taking the medicine.  Follow these instructions at home:  Alcohol use  Do not drink alcohol if your health care provider tells you not to drink.  If you drink alcohol:  Limit how much you have to 0-2 drinks a day.  Know how much alcohol is in your drink. In the [place] one drink equals one 12 oz bottle of beer (355 mL), one 5 oz glass of wine (148 mL), or one 1 oz glass of hard liquor (44 mL).  Lifestyle  Do not use any products that contain nicotine or tobacco. These products include cigarettes, chewing tobacco, and vaping devices, such as e-cigarettes. If you need help quitting, ask your health care provider.  Do not use street drugs.  Do not share needles.  Ask your health care provider for help if you need support or information about quitting drugs.  General instructions  Schedule regular health, dental, and eye exams.  Stay current with your vaccines.  Tell your health care provider if:  You often feel depressed.  You have ever been abused or do not feel safe at home.  Summary  Adopting a healthy lifestyle and getting preventive care are important in promoting health and wellness.  Follow your health care provider's instructions about healthy diet, exercising, and getting tested or screened for diseases.  Follow your health care provider's instructions on monitoring your cholesterol and blood pressure.  This information is not intended to replace advice given to you by your health care provider. Make sure you discuss any questions you have with your health care provider.  Document Revised: 04/05/2021 Document Reviewed: 04/05/2021  Elsevier Patient Education  2024 [employer].

## 2024-04-24 NOTE — Progress Notes (Unsigned)
 Subjective:  Patient ID: Ronald Bond, male    DOB: 1946/04/26  Age: 78 y.o. MRN: [id number]  CC: Annual Exam, Hypertension, Diabetes, Hyperlipidemia, Hypothyroidism, and Anemia   HPI Ronald Bond presents for a CPX and f/up ----   Discussed the use of AI scribe software for clinical note transcription with the patient, who gave verbal consent to proceed.  History of Present Illness   Ronald Bond is a 78 year old male who presents with eye bulging and balance issues.  He has experienced eye bulging, which is affecting his balance. He was evaluated at [facility] in [place], where a Jordan regimen was considered but not pursued due to underlying health problems. He mentions inflammation in the eye socket, for which he was prescribed steroids.  He experiences itching in his ears and has been told by his family that he has hearing loss, suggesting he might need a hearing aid.  He feels weak, dizzy, and lightheaded. No pain in his legs when walking.  No chest pain, shortness of breath, or headaches.        Outpatient Medications Prior to Visit  Medication Sig Dispense Refill   ACCRUFER  30 MG CAPS SMARTSIG:1 Capsule(s) By Mouth Morning-Night     albuterol  (VENTOLIN  HFA) 108 (90 Base) MCG/ACT inhaler TAKE 2 PUFFS BY MOUTH EVERY 6 HOURS AS NEEDED FOR WHEEZE OR SHORTNESS OF BREATH 18 each 3   amLODipine  (NORVASC ) 5 MG tablet Take 1.5 tablets (7.5 mg total) by mouth daily. 45 tablet 9   atorvastatin  (LIPITOR) 40 MG tablet Take 1 tablet (40 mg total) by mouth daily. 90 tablet 0   Blood Pressure Monitoring (BLOOD PRESSURE MONITOR/ARM) DEVI 1 Device by Does not apply route daily. USE DAILY TO  TAKE BLOOD PRESSURE READINGS 1 each 0   cetirizine (ZYRTEC) 10 MG tablet Take 10 mg by mouth daily.     cholecalciferol  (VITAMIN D3) 25 MCG (1000 UNIT) tablet Take 1,000 Units by mouth daily.     docusate sodium  (COLACE) 100 MG capsule Take 100 mg by mouth 2 (two) times daily.      dorzolamide -timolol  (COSOPT ) 2-0.5 % ophthalmic solution Instill 1 drop into both eyes twice a day 10 mL 6   empagliflozin (JARDIANCE) 25 MG TABS tablet Take 1 tablet (25 mg total) by mouth daily. 90 tablet 0   fluticasone  (FLONASE ) 50 MCG/ACT nasal spray Place 2 sprays into both nostrils daily. (Patient taking differently: Place 2 sprays into both nostrils daily as needed for allergies.) 48 g 1   latanoprost  (XALATAN ) 0.005 % ophthalmic solution Place 1 drop into both eyes every night at bedtime. 2.5 mL 6   levothyroxine  (SYNTHROID ) 125 MCG tablet Take 1 tablet (125 mcg total) by mouth daily. 90 tablet 3   metoprolol  tartrate (LOPRESSOR ) 25 MG tablet TAKE 1/2 TABLET BY MOUTH TWICE DAILY @ 9AM & 5PM 90 tablet 3   Multiple Vitamins-Minerals (ONE-A-DAY 50 PLUS PO) Take by mouth daily.     omeprazole  (PRILOSEC) 40 MG capsule TAKE ONE CAPSULE (40 MG TOTAL) BY MOUTH DAILY AT 9AM 90 capsule 2   Polyethyl Glycol-Propyl Glycol 0.4-0.3 % SOLN Place 1 drop into both eyes as needed (dry eyes).     tamsulosin  (FLOMAX ) 0.4 MG CAPS capsule TAKE 1 CAPSULE (0.4 MG TOTAL) BY MOUTH DAILY AFTER BREAKFAST. 90 capsule 0   TURMERIC CURCUMIN PO Take 1 tablet by mouth daily.     valsartan  (DIOVAN ) 320 MG tablet Take 1 tablet (320 mg total)  by mouth daily. 90 tablet 0   dorzolamide -timolol  (COSOPT ) 2-0.5 % ophthalmic solution Place 1 drop into the right eye 2 (two) times daily. 30 mL 0   ferrous sulfate  220 (44 Fe) MG/5ML solution TAKE 5 MLS (220 MG TOTAL) BY MOUTH DAILY 450 mL 0   Finerenone  (KERENDIA ) 20 MG TABS Take 1 tablet (20 mg total) by mouth daily. 90 tablet 0   hydrALAZINE  (APRESOLINE ) 25 MG tablet TAKE ONE TABLET BY MOUTH THREE TIMES DAILY AT 9AM, 1PM AND 5PM 180 tablet 3   No facility-administered medications prior to visit.    ROS Review of Systems  Constitutional:  Negative for appetite change, chills, diaphoresis, fatigue and fever.  HENT:  Negative for trouble swallowing.   Eyes:  Positive for  visual disturbance.  Respiratory:  Negative for cough, chest tightness, shortness of breath and wheezing.   Cardiovascular:  Negative for chest pain, palpitations and leg swelling.  Gastrointestinal: Negative.  Negative for abdominal pain, constipation, diarrhea, nausea and vomiting.  Genitourinary: Negative.  Negative for difficulty urinating.  Musculoskeletal: Negative.  Negative for arthralgias and myalgias.  Skin: Negative.  Negative for color change and pallor.  Neurological:  Positive for dizziness, weakness and light-headedness. Negative for syncope.  Hematological:  Negative for adenopathy. Does not bruise/bleed easily.  Psychiatric/Behavioral: Negative.      Objective:  BP (!) 172/86 (BP Location: Left Arm, Patient Position: Sitting, Cuff Size: Normal)   Pulse (!) 44   Temp 97.8 F (36.6 C) (Oral)   Resp 16   Ht 5\' 10"  (1.778 m)   Wt 213 lb 6.4 oz (96.8 kg)   SpO2 98%   BMI 30.62 kg/m   BP Readings from Last 3 Encounters:  04/24/24 (!) 172/86  02/28/24 130/70  12/05/23 124/76    Wt Readings from Last 3 Encounters:  04/24/24 213 lb 6.4 oz (96.8 kg)  02/28/24 210 lb (95.3 kg)  12/05/23 212 lb (96.2 kg)    Physical Exam Vitals reviewed.  HENT:     Right Ear: Tympanic membrane, ear canal and external ear normal. Decreased hearing noted.     Left Ear: Tympanic membrane, ear canal and external ear normal. Decreased hearing noted.  Eyes:     Extraocular Movements:     Right eye: Abnormal extraocular motion present.     Left eye: Abnormal extraocular motion present.  Cardiovascular:     Rate and Rhythm: Regular rhythm. Bradycardia present.     Heart sounds: Normal heart sounds, S1 normal and S2 normal. No murmur heard.    Comments: EKG---- Marked SB, 44 bpm LAD No LVH, Q waves, or ST/T wave changes  Musculoskeletal:     Right lower leg: No edema.     Left lower leg: No edema.     Lab Results  Component Value Date   WBC 7.4 04/24/2024   HGB 12.5 (L)  04/24/2024   HCT 37.9 (L) 04/24/2024   PLT 297.0 04/24/2024   GLUCOSE 109 (H) 04/24/2024   CHOL 149 04/24/2024   TRIG 100.0 04/24/2024   HDL 59.30 04/24/2024   LDLDIRECT 75.0 03/08/2016   LDLCALC 70 04/24/2024   ALT 11 04/24/2024   AST 13 04/24/2024   NA 139 04/24/2024   K 4.0 04/24/2024   CL 104 04/24/2024   CREATININE 1.56 (H) 04/24/2024   BUN 18 04/24/2024   CO2 28 04/24/2024   TSH 3.74 04/24/2024   PSA 3.58 12/28/2022   INR 1.0 08/17/2023   HGBA1C 6.2 04/24/2024   MICROALBUR  15.0 (H) 04/24/2024    MR BRAIN WO CONTRAST Result Date: 08/18/2023 CLINICAL DATA:  Dizziness, balance issues, high blood pressure; stroke suspected EXAM: MRI HEAD WITHOUT CONTRAST TECHNIQUE: Multiplanar, multiecho pulse sequences of the brain and surrounding structures were obtained without intravenous contrast. COMPARISON:  No prior MRI available, correlation is made with 08/17/2023 CT head FINDINGS: Brain: No restricted diffusion to suggest acute or subacute infarct. No acute hemorrhage, mass, mass effect, or midline shift. No hydrocephalus or extra-axial collection. Normal pituitary and craniocervical junction. No hemosiderin deposition to suggest remote hemorrhage. Scattered T2 hyperintense signal in the periventricular white matter, likely the sequela of mild chronic small vessel ischemic disease. Dilated perivascular spaces in the basal ganglia and cortex. Normal cerebral volume for age. Vascular: Normal arterial flow voids. Skull and upper cervical spine: Normal marrow signal. Sinuses/Orbits: Clear paranasal sinuses. As on the prior CT, there is enlargement of the right inferior rectus muscle, and to a lesser extent, the right medial, superior, lateral rectus muscles (series 21, image 26). Right proptosis. No acute finding in the left orbit. Other: The mastoid air cells are well aerated. IMPRESSION: 1. No acute intracranial process. No evidence of acute or subacute infarct. 2. 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 Electronically Signed   By: Zoila Hines M.D.   On: 08/18/2023 01:20   CT HEAD WO CONTRAST Result Date: 08/17/2023 CLINICAL DATA:  Neuro deficit, acute, stroke suspected EXAM: CT HEAD WITHOUT CONTRAST TECHNIQUE: Contiguous axial images were obtained from the base of the skull through the vertex without intravenous contrast. RADIATION DOSE REDUCTION: This exam was performed according to the departmental dose-optimization program which includes automated exposure control, adjustment of the mA and/or kV according to patient size and/or use of iterative reconstruction technique. COMPARISON:  None Available. FINDINGS: Brain: No hemorrhage. No hydrocephalus. No extra-axial fluid collection. Possible age indeterminate infarct in the right thalamus. No mass effect. No mass lesion. Vascular: No hyperdense vessel or unexpected calcification. Skull: Normal. Negative for fracture or focal lesion. Sinuses/Orbits: No middle ear or mastoid effusion. Paranasal sinuses are clear. Bilateral lens replacement. There is asymmetric enlargement of the right inferior rectus muscle body (series 4, image 58). There is also mild right-sided proptosis. Other: None. IMPRESSION: 1. Possible age indeterminate infarct in the right thalamus. If there is clinical concern for an acute infarct, consider MRI for further evaluation. 2. Asymmetric enlargement of the right inferior rectus muscle body with mild right-sided proptosis. 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 Recommend correlation with ophthalmologic exam and thyroid  labs. Electronically Signed   By: Clora Dane M.D.   On: 08/17/2023 19:46    Assessment & Plan:  Bradycardia -     EKG 12-Lead -     TSH; Future  Sensorineural hearing loss (SNHL) of both ears -     Ambulatory referral to  ENT  Essential hypertension, benign -     TSH; Future -     Basic metabolic panel with GFR; Future -     AMB Referral VBCI Care Management -     hydrALAZINE  HCl; Take 1 tablet (50 mg total) by mouth 3 (three) times daily.  Dispense: 270 tablet; Refill: 0  Hyperlipidemia associated with type 2 diabetes mellitus (HCC) -     Lipid panel; Future -     TSH; Future -  Hepatic function panel; Future  CKD stage 3 due to type 2 diabetes mellitus (HCC) -     Microalbumin / creatinine urine ratio; Future -     Urinalysis, Routine w reflex microscopic; Future -     Basic metabolic panel with GFR; Future  Type 2 diabetes mellitus with stage 3b chronic kidney disease, without long-term current use of insulin  (HCC) -     Microalbumin / creatinine urine ratio; Future -     Urinalysis, Routine w reflex microscopic; Future -     Hemoglobin A1c; Future -     Basic metabolic panel with GFR; Future -     HM Diabetes Foot Exam -     Kerendia ; Take 1 tablet (20 mg total) by mouth daily.  Dispense: 90 tablet; Refill: 0  Encounter for general adult medical examination with abnormal findings  Stage 3b chronic kidney disease (HCC) -     Microalbumin / creatinine urine ratio; Future -     Urinalysis, Routine w reflex microscopic; Future -     Kerendia ; Take 1 tablet (20 mg total) by mouth daily.  Dispense: 90 tablet; Refill: 0  Hypertensive heart disease without CHF (congestive heart failure) -     AMB Referral VBCI Care Management -     hydrALAZINE  HCl; Take 1 tablet (50 mg total) by mouth 3 (three) times daily.  Dispense: 270 tablet; Refill: 0  Iron deficiency anemia secondary to inadequate dietary iron intake -     IBC + Ferritin; Future -     CBC with Differential/Platelet; Future  Postablative hypothyroidism -     TSH; Future  Bradycardia with 41-50 beats per minute -     LONG TERM MONITOR (3-14 DAYS); Future  Type 2 diabetes mellitus with diabetic microalbuminuria, without long-term  current use of insulin  (HCC) -     Kerendia ; Take 1 tablet (20 mg total) by mouth daily.  Dispense: 90 tablet; Refill: 0  Cardiomegaly -     hydrALAZINE  HCl; Take 1 tablet (50 mg total) by mouth 3 (three) times daily.  Dispense: 270 tablet; Refill: 0  Thiamine  deficiency -     Vitamin B-1; Take 1 tablet (50 mg total) by mouth daily.  Dispense: 90 tablet; Refill: 0     Follow-up: Return in about 3 months (around 07/25/2024).  Sandra Crouch, MD

## 2024-05-01 ENCOUNTER — Encounter (INDEPENDENT_AMBULATORY_CARE_PROVIDER_SITE_OTHER): Payer: Self-pay | Admitting: Otolaryngology

## 2024-05-03 ENCOUNTER — Ambulatory Visit (INDEPENDENT_AMBULATORY_CARE_PROVIDER_SITE_OTHER): Payer: Medicare (Managed Care)

## 2024-05-03 VITALS — Ht 70.0 in | Wt 213.0 lb

## 2024-05-03 DIAGNOSIS — Z0000 Encounter for general adult medical examination without abnormal findings: Secondary | ICD-10-CM

## 2024-05-03 NOTE — Patient Instructions (Signed)
 Mr. Necaise , Thank you for taking time out of your busy schedule to complete your Annual Wellness Visit with me. I enjoyed our conversation and look forward to speaking with you again next year. I, as well as your care team,  appreciate your ongoing commitment to your health goals. Please review the following plan we discussed and let me know if I can assist you in the future. Your Game plan/ To Do List   Follow up Visits: Next Medicare AWV with our clinical staff: 05/08/2025.   Have you seen your provider in the last 6 months (3 months if uncontrolled diabetes)? Yes Next Office Visit with your provider: Last office visit on 5/58/2025.  Clinician Recommendations:  Aim for 30 minutes of exercise or brisk walking, 6-8 glasses of water , and 5 servings of fruits and vegetables each day.       This is a list of the screening recommended for you and due dates:  Health Maintenance  Topic Date Due   COVID-19 Vaccine (8 - 2024-25 season) 07/30/2023   Medicare Annual Wellness Visit  12/27/2023   Zoster (Shingles) Vaccine (2 of 2) 07/27/2024*   Flu Shot  06/28/2024   Eye exam for diabetics  08/21/2024   Hemoglobin A1C  10/25/2024   Yearly kidney function blood test for diabetes  04/24/2025   Yearly kidney health urinalysis for diabetes  04/24/2025   Complete foot exam   04/24/2025   DTaP/Tdap/Td vaccine (3 - Td or Tdap) 08/24/2025   Pneumonia Vaccine  Completed   Hepatitis C Screening  Completed   HPV Vaccine  Aged Out   Meningitis B Vaccine  Aged Out   Cologuard (Stool DNA test)  Discontinued  *Topic was postponed. The date shown is not the original due date.    Advanced directives: (Copy Requested) Please bring a copy of your health care power of attorney and living will to the office to be added to your chart at your convenience. You can mail to [facility] [address] [place], [place] [address] or email to [email or web address] Advance Care Planning is important  because it:  [x]  Makes sure you receive the medical care that is consistent with your values, goals, and preferences  [x]  It provides guidance to your family and loved ones and reduces their decisional burden about whether or not they are making the right decisions based on your wishes.  Follow the link provided in your after visit summary or read over the paperwork we have mailed to you to help you started getting your Advance Directives in place. If you need assistance in completing these, please reach out to us  so that we can help you!  See attachments for Preventive Care and Fall Prevention Tips.

## 2024-05-03 NOTE — Progress Notes (Signed)
 Subjective:   Ronald Bond is a 78 y.o. who presents for a Medicare Wellness preventive visit.  As a reminder, Annual Wellness Visits don't include a physical exam, and some assessments may be limited, especially if this visit is performed virtually. We may recommend an in-person follow-up visit with your provider if needed.  Visit Complete: Virtual I connected with  [name] on 05/03/24 by a audio enabled telemedicine application and verified that I am speaking with the correct person using two identifiers.  Patient Location: Home  Provider Location: Office/Clinic  I discussed the limitations of evaluation and management by telemedicine. The patient expressed understanding and agreed to proceed.  Vital Signs: Because this visit was a virtual/telehealth visit, some criteria may be missing or patient reported. Any vitals not documented were not able to be obtained and vitals that have been documented are patient reported.  VideoDeclined- This patient declined Librarian, academic. Therefore the visit was completed with audio only.  Persons Participating in Visit: Patient.  AWV Questionnaire: No: Patient Medicare AWV questionnaire was not completed prior to this visit.  Cardiac Risk Factors include: advanced age (>2men, >48 women);male gender;hypertension;dyslipidemia;diabetes mellitus;Other (see comment), Risk factor comments: OSA, CKD stage 3     Objective:     Today's Vitals   05/03/24 1121  Weight: 213 lb (96.6 kg)  Height: 5\' 10"  (1.778 m)   Body mass index is 30.56 kg/m.     05/03/2024   11:44 AM 03/03/2023   11:09 AM 02/27/2023    9:11 AM 02/16/2023    2:07 PM 12/26/2022    2:20 PM 08/03/2022    9:41 AM 07/06/2022    2:58 PM  Advanced Directives  Does Patient Have a Medical Advance Directive? Yes Yes Yes Yes Yes Yes No  Type of Estate agent of [place];Living will Healthcare Power of [place];Living will  Healthcare Power of [place];Living will  Living will;Healthcare Power of [employer] Power of [place];Living will   Does patient want to make changes to medical advance directive?  No - Patient declined No - Patient declined No - Patient declined No - Patient declined No - Patient declined   Copy of Healthcare Power of Attorney in Chart? No - copy requested No - copy requested No - copy requested  No - copy requested No - copy requested   Would patient like information on creating a medical advance directive?       No - Patient declined    Current Medications (verified) Outpatient Encounter Medications as of 05/03/2024  Medication Sig   ACCRUFER  30 MG CAPS SMARTSIG:1 Capsule(s) By Mouth Morning-Night   albuterol  (VENTOLIN  HFA) 108 (90 Base) MCG/ACT inhaler TAKE 2 PUFFS BY MOUTH EVERY 6 HOURS AS NEEDED FOR WHEEZE OR SHORTNESS OF BREATH   amLODipine  (NORVASC ) 5 MG tablet Take 1.5 tablets (7.5 mg total) by mouth daily.   atorvastatin  (LIPITOR) 40 MG tablet Take 1 tablet (40 mg total) by mouth daily.   cetirizine (ZYRTEC) 10 MG tablet Take 10 mg by mouth daily.   cholecalciferol  (VITAMIN D3) 25 MCG (1000 UNIT) tablet Take 1,000 Units by mouth daily.   docusate sodium  (COLACE) 100 MG capsule Take 100 mg by mouth 2 (two) times daily.   dorzolamide -timolol  (COSOPT ) 2-0.5 % ophthalmic solution Instill 1 drop into both eyes twice a day   empagliflozin (JARDIANCE) 25 MG TABS tablet Take 1 tablet (25 mg total) by mouth daily.   Finerenone  (KERENDIA ) 20 MG TABS Take 1  tablet (20 mg total) by mouth daily.   fluticasone  (FLONASE ) 50 MCG/ACT nasal spray Place 2 sprays into both nostrils daily. (Patient taking differently: Place 2 sprays into both nostrils daily as needed for allergies.)   hydrALAZINE  (APRESOLINE ) 50 MG tablet Take 1 tablet (50 mg total) by mouth 3 (three) times daily.   latanoprost  (XALATAN ) 0.005 % ophthalmic solution Place 1 drop into both eyes every night at bedtime.   levofloxacin  (LEVAQUIN) 500 MG tablet Take 500 mg by mouth daily.   levothyroxine  (SYNTHROID ) 125 MCG tablet Take 1 tablet (125 mcg total) by mouth daily.   metoprolol  tartrate (LOPRESSOR ) 25 MG tablet TAKE 1/2 TABLET BY MOUTH TWICE DAILY @ 9AM & 5PM   Multiple Vitamins-Minerals (ONE-A-DAY 50 PLUS PO) Take by mouth daily.   omeprazole  (PRILOSEC) 40 MG capsule TAKE ONE CAPSULE (40 MG TOTAL) BY MOUTH DAILY AT 9AM   Polyethyl Glycol-Propyl Glycol 0.4-0.3 % SOLN Place 1 drop into both eyes as needed (dry eyes).   tamsulosin  (FLOMAX ) 0.4 MG CAPS capsule TAKE ONE CAPSULE BY MOUTH DAILY AT 9AM AFTER BREAKFAST   thiamine  (VITAMIN B-1) 50 MG tablet Take 1 tablet (50 mg total) by mouth daily.   TURMERIC CURCUMIN PO Take 1 tablet by mouth daily.   valsartan  (DIOVAN ) 320 MG tablet Take 1 tablet (320 mg total) by mouth daily.   Blood Pressure Monitoring (BLOOD PRESSURE MONITOR/ARM) DEVI 1 Device by Does not apply route daily. USE DAILY TO  TAKE BLOOD PRESSURE READINGS (Patient not taking: Reported on 05/03/2024)   No facility-administered encounter medications on file as of 05/03/2024.    Allergies (verified) Lisinopril   History: Past Medical History:  Diagnosis Date   Allergy    Anemia    Asthma    Cataract    Chronic kidney disease    Diabetes mellitus without complication (HCC)    GERD (gastroesophageal reflux disease)    Glaucoma    HTN (hypertension)    Hyperlipidemia    Hypothyroidism    Osteoarthritis    Pneumonia    Pre-diabetes    Sleep apnea    no CPAP   Wears glasses    Past Surgical History:  Procedure Laterality Date   COLONOSCOPY     EYE SURGERY  2006   both cataracts   JOINT REPLACEMENT     MICROLARYNGOSCOPY Left 11/18/2013   Procedure: MICROLARYNGOSCOPY WITH REMOVAL OF GRANULOMA LEFT SIDE;  Surgeon: Janita Mellow, MD;  Location: [facility];  Service: ENT;  Laterality: Left;   NM MYOVIEW  LTD  06/2007   Low risk, normal.  No evidence of ischemia or infarction    THYROIDECTOMY  1974   TONSILLECTOMY     as a child   TOTAL KNEE ARTHROPLASTY  2009   left   TOTAL KNEE ARTHROPLASTY Right 02/27/2023   Procedure: TOTAL KNEE ARTHROPLASTY;  Surgeon: Murleen Arms, MD;  Location: [facility];  Service: Orthopedics;  Laterality: Right;   TOTAL KNEE REVISION Left 07/06/2022   Procedure: TOTAL KNEE REVISION;  Surgeon: Murleen Arms, MD;  Location: [facility];  Service: Orthopedics;  Laterality: Left;   Family History  Problem Relation Age of Onset   Brain cancer Mother    Heart disease Father    Heart disease Brother    Social History   Socioeconomic History   Marital status: Married    Spouse name: Actor   Number of children: 2   Years of education: Not on file   Highest education level:  Not on file  Occupational History   Occupation: railroad   Occupation: RETIRED  Tobacco Use   Smoking status: Never   Smokeless tobacco: Never  Vaping Use   Vaping status: Never Used  Substance and Sexual Activity   Alcohol  use: Not Currently    Comment: occassionally   Drug use: No   Sexual activity: Not Currently  Other Topics Concern   Not on file  Social History Narrative   Regular Exercise -  YES      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  He states that he enjoys  the job a lot does a lot of walking and climbing stairs without significant discomfort.      He usually works ~2 weeks @ a time - stays in [employer] (paid for by [employer]) - & comes home for ~4 d weekends.      2 daughter and 2 grand kids and wife lives with pt/2025   Social Drivers of Health   Financial Resource Strain: Low Risk  (05/03/2024)   Overall Financial Resource Strain (CARDIA)    Difficulty of Paying Living Expenses: Not hard at all  Food Insecurity: No Food Insecurity (05/03/2024)   Hunger Vital Sign    Worried About Running  Out of Food in the Last Year: Never true    Ran Out of Food in the Last Year: Never true  Transportation Needs: No Transportation Needs (05/03/2024)   PRAPARE - Administrator, Civil Service (Medical): No    Lack of Transportation (Non-Medical): No  Physical Activity: Inactive (05/03/2024)   Exercise Vital Sign    Days of Exercise per Week: 0 days    Minutes of Exercise per Session: 0 min  Stress: No Stress Concern Present (05/03/2024)   [employer] of Occupational Health - Occupational Stress Questionnaire    Feeling of Stress : Not at all  Social Connections: Socially Integrated (05/03/2024)   Social Connection and Isolation Panel [NHANES]    Frequency of Communication with Friends and Family: More than three times a week    Frequency of Social Gatherings with Friends and Family: Twice a week    Attends Religious Services: More than 4 times per year    Active Member of [employer] or Organizations: Yes    Attends Banker Meetings: Never    Marital Status: Married    Tobacco Counseling Counseling given: Not Answered    Clinical Intake:  Pre-visit preparation completed: Yes  Pain : No/denies pain     BMI - recorded: 30.56 Nutritional Status: BMI > 30  Obese Nutritional Risks: None Diabetes: Yes CBG done?: No Did pt. bring in CBG monitor from home?: No  Lab Results  Component Value Date   HGBA1C 6.2 04/24/2024   HGBA1C 6.2 08/30/2023   HGBA1C 6.2 03/30/2023     How often do you need to have someone help you when you read instructions, pamphlets, or other written materials from your doctor or pharmacy?: 1 - Never  Interpreter Needed?: No  Information entered by :: Deniesha Stenglein, RMA   Activities of Daily Living     05/03/2024   11:22 AM  In your present state of health, do you have any difficulty performing the following activities:  Hearing? 1  Comment per pt-has some loss  Vision? 0  Difficulty concentrating  or making decisions? 0   Walking or climbing stairs? 0  Dressing or bathing? 0  Doing errands, shopping? 0  Preparing Food and eating ? N  Using the Toilet? N  In the past six months, have you accidently leaked urine? Y  Do you have problems with loss of bowel control? N  Managing your Medications? N  Managing your Finances? N  Housekeeping or managing your Housekeeping? N    Patient Care Team: Arcadio Knuckles, MD as PCP - General Addie Holstein Vashti Gentles, MD as PCP - Cardiology (Cardiology) Trent Frizzle, MD as Consulting Physician (Urology) Faustina Hood, MD as Consulting Physician (Pulmonary Disease) Darius Edouard, MD as Consulting Physician (Retina Ophthalmology) Tami Falcon, MD as Consulting Physician (Gastroenterology) [facility]  I have updated your Care Teams any recent Medical Services you may have received from other providers in the past year.     Assessment:    This is a routine wellness examination for Caetano.  Hearing/Vision screen Hearing Screening - Comments:: Has some hearing issues-per pt Vision Screening - Comments:: Wears eyeglasses/Dr. Candi Chafe   Goals Addressed   None    Depression Screen     05/03/2024   11:47 AM 04/24/2024    8:09 AM 12/28/2022    8:48 AM 12/26/2022    2:20 PM 12/24/2021    1:38 PM 06/21/2021    9:16 AM 09/18/2020   11:47 AM  PHQ 2/9 Scores  PHQ - 2 Score 0 0 0 0 0 0 0  PHQ- 9 Score 4          Fall Risk     05/03/2024   11:44 AM 04/24/2024    8:09 AM 12/28/2022    8:47 AM 12/26/2022    2:20 PM 12/24/2021    1:43 PM  Fall Risk   Falls in the past year? 0 0 1 1 0  Number falls in past yr: 0 0 0 0 0  Injury with Fall? 0 0 0 0 0  Risk for fall due to :  No Fall Risks Impaired balance/gait No Fall Risks No Fall Risks  Follow up Falls evaluation completed;Falls prevention discussed Falls evaluation completed  Falls evaluation completed Falls evaluation completed    MEDICARE RISK AT HOME:  Medicare Risk at Home Any stairs in or  around the home?: Yes (2 story home) If so, are there any without handrails?: Yes Home free of loose throw rugs in walkways, pet beds, electrical cords, etc?: Yes Adequate lighting in your home to reduce risk of falls?: Yes Life alert?: No Use of a cane, walker or w/c?: No Grab bars in the bathroom?: Yes Shower chair or bench in shower?: Yes Elevated toilet seat or a handicapped toilet?: Yes  TIMED UP AND GO:  Was the test performed?  No  Cognitive Function: Declined/Normal: No cognitive concerns noted by patient or family. Patient alert, oriented, able to answer questions appropriately and recall recent events. No signs of memory loss or confusion.    11/30/2017    8:36 AM  MMSE - Mini Mental State Exam  Orientation to time 5  Orientation to Place 5  Registration 3  Attention/ Calculation 5  Recall 1  Language- name 2 objects 2  Language- repeat 1  Language- follow 3 step command 3  Language- read & follow direction 1  Write a sentence 1  Copy design 1  Total score 28        12/26/2022    2:22 PM 09/18/2020  11:50 AM  6CIT Screen  What Year? 0 points 0 points  What month? 0 points 0 points  What time? 0 points 0 points  Count back from 20 0 points 0 points  Months in reverse 0 points 0 points  Repeat phrase 0 points 0 points  Total Score 0 points 0 points    Immunizations Immunization History  Administered Date(s) Administered   Fluad Quad(high Dose 65+) 07/27/2019, 08/12/2022   Fluad Trivalent(High Dose 65+) 08/30/2023   Influenza Split 10/24/2011   Influenza Whole 08/19/2009, 07/23/2010   Influenza, High Dose Seasonal PF 10/21/2013, 09/27/2016, 09/07/2017, 09/14/2018, 08/18/2021   Influenza,inj,Quad PF,6+ Mos 08/07/2014, 08/25/2015   Influenza-Unspecified 08/28/2020, 08/18/2021   PFIZER Comirnaty(Gray Top)Covid-19 Tri-Sucrose Vaccine 04/17/2021   PFIZER(Purple Top)SARS-COV-2 Vaccination 01/27/2020, 02/25/2020, 08/28/2020   Pfizer Covid-19 Vaccine Bivalent  Booster 39yrs & up 09/06/2021   Pfizer(Comirnaty)Fall Seasonal Vaccine 12 years and older 08/20/2022, 02/03/2023   Pneumococcal Conjugate-13 08/25/2015   Pneumococcal Polysaccharide-23 11/28/2004, 03/08/2016, 12/28/2022   Respiratory Syncytial Virus Vaccine,Recomb Aduvanted(Arexvy) 09/28/2022   Td 11/28/2004   Tdap 08/25/2015   Zoster Recombinant(Shingrix ) 08/23/2019   Zoster, Live 02/24/2011    Screening Tests Health Maintenance  Topic Date Due   COVID-19 Vaccine (8 - 2024-25 season) 07/30/2023   Medicare Annual Wellness (AWV)  12/27/2023   Zoster Vaccines- Shingrix  (2 of 2) 07/27/2024 (Originally 10/18/2019)   INFLUENZA VACCINE  06/28/2024   OPHTHALMOLOGY EXAM  08/21/2024   HEMOGLOBIN A1C  10/25/2024   Diabetic kidney evaluation - eGFR measurement  04/24/2025   Diabetic kidney evaluation - Urine ACR  04/24/2025   FOOT EXAM  04/24/2025   DTaP/Tdap/Td (3 - Td or Tdap) 08/24/2025   Pneumonia Vaccine 93+ Years old  Completed   Hepatitis C Screening  Completed   HPV VACCINES  Aged Out   Meningococcal B Vaccine  Aged Out   Fecal DNA (Cologuard)  Discontinued    Health Maintenance  Health Maintenance Due  Topic Date Due   COVID-19 Vaccine (8 - 2024-25 season) 07/30/2023   Medicare Annual Wellness (AWV)  12/27/2023   Health Maintenance Items Addressed: See Nurse Notes at the end of this note  Additional Screening:  Vision Screening: Recommended annual ophthalmology exams for early detection of glaucoma and other disorders of the eye. Would you like a referral to an eye doctor? No    Dental Screening: Recommended annual dental exams for proper oral hygiene  Community Resource Referral / Chronic Care Management: CRR required this visit?  No   CCM required this visit?  No   Plan:    I have personally reviewed and noted the following in the patient's chart:   Medical and social history Use of alcohol , tobacco or illicit drugs  Current medications and supplements  including opioid prescriptions. Patient is not currently taking opioid prescriptions. Functional ability and status Nutritional status Physical activity Advanced directives List of other physicians Hospitalizations, surgeries, and ER visits in previous 12 months Vitals Screenings to include cognitive, depression, and falls Referrals and appointments  In addition, I have reviewed and discussed with patient certain preventive protocols, quality metrics, and best practice recommendations. A written personalized care plan for preventive services as well as general preventive health recommendations were provided to patient.   Shiquan Mathieu L Kayne Yuhas, CMA   05/03/2024   After Visit Summary: (MyChart) Due to this being a telephonic visit, the after visit summary with patients personalized plan was offered to patient via MyChart   Notes: Please refer to Routing Comments.

## 2024-05-06 ENCOUNTER — Telehealth: Payer: Self-pay | Admitting: *Deleted

## 2024-05-06 NOTE — Progress Notes (Signed)
 Care Guide Pharmacy Note  05/06/2024 Name: [name] MRN: [id number] DOB: 08-02-1946  Referred By: Arcadio Knuckles, MD Reason for referral: Complex Care Management and Call Attempt #1 (Outreach to schedule referral with pharmacist )   Ronald Bond is a 78 y.o. year old male who is a primary care patient of Arcadio Knuckles, MD.  [name] was referred to the pharmacist for assistance related to: HTN  An unsuccessful telephone outreach was attempted today to contact the patient who was referred to the pharmacy team for assistance with medication management. Additional attempts will be made to contact the patient.  Kandis Ormond, CMA   [facility], [facility] Guide Direct Dial: [phone]  Fax: [phone] Website: [email or web address]

## 2024-05-07 NOTE — Progress Notes (Signed)
 Care Guide Pharmacy Note  05/07/2024 Name: Ronald Bond MRN: [id number] DOB: 12-06-1945  Referred By: Arcadio Knuckles, MD Reason for referral: Complex Care Management and Call Attempt #1 (Outreach to schedule referral with pharmacist )   Ronald Bond is a 78 y.o. year old male who is a primary care patient of Arcadio Knuckles, MD.  [name] was referred to the pharmacist for assistance related to: HTN  Successful contact was made with the patient to discuss pharmacy services including being ready for the pharmacist to call at least 5 minutes before the scheduled appointment time and to have medication bottles and any blood pressure readings ready for review. The patient agreed to meet with the pharmacist via telephone visit on 05/16/2024  Kandis Ormond, CMA [place]  [facility], [facility] Guide Direct Dial: [phone]  Fax: [phone] Website: [email or web address]

## 2024-05-13 ENCOUNTER — Other Ambulatory Visit (HOSPITAL_BASED_OUTPATIENT_CLINIC_OR_DEPARTMENT_OTHER): Payer: Self-pay

## 2024-05-16 ENCOUNTER — Other Ambulatory Visit (INDEPENDENT_AMBULATORY_CARE_PROVIDER_SITE_OTHER): Payer: Medicare (Managed Care) | Admitting: Pharmacist

## 2024-05-16 DIAGNOSIS — I10 Essential (primary) hypertension: Secondary | ICD-10-CM

## 2024-05-16 NOTE — Progress Notes (Signed)
 05/16/2024 Name: Ronald Bond MRN: [id number] DOB: 15-Mar-1946  Chief Complaint  Patient presents with   Hypertension   Medication Management    Ronald Bond is a 78 y.o. year old male who presented for a telephone visit.   They were referred to the pharmacist by their PCP for assistance in managing hypertension.   Subjective:  Care Team: Primary Care Provider: Arcadio Knuckles, MD ; Next Scheduled Visit: none scheduled   Medication Access/Adherence  [facility]  [phone] [place], [place] - [address] [facility] [address] [place] [address] Phone: [phone] Fax: [phone]  [facility] - [place], [place] - [address] [place] [address] Phone: [phone] Fax: [phone]  [facility] [place], [place] - [address] [place] [address] Phone: [phone] Fax: [phone]   Patient reports affordability concerns with their medications: No  Patient reports access/transportation concerns to their pharmacy: No  Patient reports adherence concerns with their medications:  No    Pt notes he is still wearing Zio Patch for about 8 more days  Hypertension:  Current medications: amlodipine  7.5 mg daily, hydralazine  50 mg TID, valsartan  320 mg daily, metoprolol  tartrate 25 mg BID  Medications previously tried: amlodipine /valsartan /hydrochlorothiazide , carvedilol , hydrochlorothiazide , indapamide   Patient has a validated, automated, upper arm home BP cuff Current blood pressure readings: this morning was 171/94 before medications, 165/82 another morning. Has only been checking prior to meds.  Patient reports hypotensive s/sx including dizziness, lightheadedness when standing.  Patient reports hypertensive symptoms including occasional chest pain and comes and goes randomly on its own. Denies headache, shortness of  breath  Objective: BP Readings from Last 3 Encounters:  04/24/24 (!) 172/86  02/28/24 130/70  12/05/23 124/76    Lab Results  Component Value Date   HGBA1C 6.2 04/24/2024    Lab Results  Component Value Date   CREATININE 1.56 (H) 04/24/2024   BUN 18 04/24/2024   NA 139 04/24/2024   K 4.0 04/24/2024   CL 104 04/24/2024   CO2 28 04/24/2024    Lab Results  Component Value Date   CHOL 149 04/24/2024   HDL 59.30 04/24/2024   LDLCALC 70 04/24/2024   LDLDIRECT 75.0 03/08/2016   TRIG 100.0 04/24/2024   CHOLHDL 3 04/24/2024    Medications Reviewed Today     Reviewed by Dion Frankel, RPH (Pharmacist) on 05/16/24 at 1422  Med List Status: <None>   Medication Order Taking? Sig Documenting Provider Last Dose Status Informant  ACCRUFER  30 MG CAPS [id number]  SMARTSIG:1 Capsule(s) By Mouth Morning-Night [provider]  Active   albuterol  (VENTOLIN  HFA) 108 (90 Base) MCG/ACT inhaler [id number]  TAKE 2 PUFFS BY MOUTH EVERY 6 HOURS AS NEEDED FOR WHEEZE OR SHORTNESS OF Forbes Ida, MD  Active   amLODipine  (NORVASC ) 5 MG tablet [id number] Yes Take 1.5 tablets (7.5 mg total) by mouth daily. Carie Charity, NP  Active   atorvastatin  (LIPITOR) 40 MG tablet [id number]  Take 1 tablet (40 mg total) by mouth daily. Arcadio Knuckles, MD  Active   Blood Pressure Monitoring (BLOOD PRESSURE MONITOR/ARM) DEVI [id number]  1 Device by Does not apply route daily. USE DAILY TO  TAKE BLOOD PRESSURE READINGS  Patient not taking: Reported on 05/03/2024   Arleen Lacer, MD  Active Self  cetirizine (ZYRTEC) 10 MG tablet [id number]  Take 10  mg by mouth daily. [provider]  Active Self  cholecalciferol  (VITAMIN D3) 25 MCG (1000 UNIT) tablet [id number]  Take 1,000 Units by mouth daily. [provider]  Active Self  docusate sodium  (COLACE) 100 MG capsule [id number]  Take 100 mg by mouth 2 (two) times daily. [provider]  Active Self  dorzolamide -timolol   (COSOPT ) 2-0.5 % ophthalmic solution [id number]  Instill 1 drop into both eyes twice a day   Active   empagliflozin (JARDIANCE) 25 MG TABS tablet [id number] Yes Take 1 tablet (25 mg total) by mouth daily. Arcadio Knuckles, MD  Active   Finerenone  (KERENDIA ) 20 MG TABS [id number] Yes Take 1 tablet (20 mg total) by mouth daily. Arcadio Knuckles, MD  Active   fluticasone  (FLONASE ) 50 MCG/ACT nasal spray [id number]  Place 2 sprays into both nostrils daily.  Patient taking differently: Place 2 sprays into both nostrils daily as needed for allergies.   Arcadio Knuckles, MD  Active Self  hydrALAZINE  (APRESOLINE ) 50 MG tablet [id number] Yes Take 1 tablet (50 mg total) by mouth 3 (three) times daily. Arcadio Knuckles, MD  Active   latanoprost  (XALATAN ) 0.005 % ophthalmic solution [id number]  Place 1 drop into both eyes every night at bedtime.   Active   levofloxacin (LEVAQUIN) 500 MG tablet [id number]  Take 500 mg by mouth daily. [provider]  Active   levothyroxine  (SYNTHROID ) 125 MCG tablet [id number]  Take 1 tablet (125 mcg total) by mouth daily. Ronald, Ibtehal Jaralla, MD  Active   metoprolol  tartrate (LOPRESSOR ) 25 MG tablet [id number] Yes TAKE 1/2 TABLET BY MOUTH TWICE DAILY @ 9AM & 5PM Arleen Lacer, MD  Active   Multiple Vitamins-Minerals (ONE-A-DAY 50 PLUS PO) [id number]  Take by mouth daily. [provider]  Active   omeprazole  (PRILOSEC) 40 MG capsule [id number]  TAKE ONE CAPSULE (40 MG TOTAL) BY MOUTH DAILY AT Bo Burly, MD  Active   Polyethyl Glycol-Propyl Glycol 0.4-0.3 % SOLN [id number]  Place 1 drop into both eyes as needed (dry eyes). [provider]  Active Self  tamsulosin  (FLOMAX ) 0.4 MG CAPS capsule [id number]  TAKE ONE CAPSULE BY MOUTH DAILY AT 9AM AFTER Ronald Hoyle, MD  Active   thiamine  (VITAMIN B-1) 50 MG tablet [id number]  Take 1 tablet (50 mg total) by mouth daily. Arcadio Knuckles, MD  Active   TURMERIC CURCUMIN PO [id number]  Take 1 tablet  by mouth daily. [provider]  Active Self  valsartan  (DIOVAN ) 320 MG tablet [id number] Yes Take 1 tablet (320 mg total) by mouth daily. Arcadio Knuckles, MD  Active               Assessment/Plan:   Hypertension: - Currently uncontrolled, BP goal <130/80 - Reviewed long term cardiovascular and renal outcomes of uncontrolled blood pressure - Reviewed appropriate blood pressure monitoring technique and reviewed goal blood pressure. Recommended to check home blood pressure and heart rate at least 2 hours or more after taking AM medications. Keep a log. Will Ronald/u for readings in 1 week - Recommend to continue current regimen     Follow Up Plan: 6/27  Rainelle Bur, PharmD, BCPS, CPP Clinical Pharmacist Practitioner [place] [facility] at [facility] [phone]

## 2024-05-16 NOTE — Patient Instructions (Signed)
 It was a pleasure speaking with you today!  Continue your current medication regimen. Check your blood pressure at least 2 hours after taking your morning medications and keep a log.  Feel free to call with any questions or concerns!  Rainelle Bur, PharmD, BCPS, CPP Clinical Pharmacist Practitioner [place] [facility] at [facility] [phone]

## 2024-05-24 ENCOUNTER — Other Ambulatory Visit (INDEPENDENT_AMBULATORY_CARE_PROVIDER_SITE_OTHER): Payer: Medicare (Managed Care) | Admitting: Pharmacist

## 2024-05-24 DIAGNOSIS — I10 Essential (primary) hypertension: Secondary | ICD-10-CM

## 2024-05-24 MED ORDER — AMLODIPINE BESYLATE 10 MG PO TABS: 10.0000 mg | ORAL_TABLET | Freq: Every day | ORAL | 0 refills | Status: DC

## 2024-05-24 NOTE — Patient Instructions (Signed)
 It was a pleasure speaking with you today!  Increase amlodipine  to 10 mg daily. You can take 5 mg 2 tablets daily until starting new prescription which will be 10 mg 1 tablet daily.  Continue monitoring blood pressure. I will call back in 2 weeks.  Feel free to call with any questions or concerns!  Darrelyn Drum, PharmD, BCPS, CPP Clinical Pharmacist Practitioner [place] [facility] at [facility] [phone]

## 2024-05-24 NOTE — Progress Notes (Signed)
 05/24/2024 Name: Ronald Bond MRN: [id number] DOB: 11-28-1946  Chief Complaint  Patient presents with   Hypertension   Medication Management    Ronald Bond is a 78 y.o. year old male who presented for a telephone visit.   They were referred to the pharmacist by their PCP for assistance in managing hypertension.   Subjective:  Care Team: Primary Care Provider: Joshua Debby CROME, MD ; Next Scheduled Visit: none scheduled   Medication Access/Adherence  [facility]  [phone] [place] - [address] AT [facility] [address] [place] [address] Phone: [phone] Fax: [phone]  [facility] - [place], [place] - [address] [place] [address] Phone: [phone] Fax: [phone]  [facility] [place], [place] - [address] [place] [address] Phone: [phone] Fax: [phone]   Patient reports affordability concerns with their medications: No  Patient reports access/transportation concerns to their pharmacy: No  Patient reports adherence concerns with their medications:  No    Pt notes he is still wearing Zio Patch for about 8 more days  Hypertension:  Current medications: amlodipine  7.5 mg daily, hydralazine  50 mg TID, valsartan  320 mg daily, metoprolol  tartrate 25 mg BID  Medications previously tried: amlodipine /valsartan /hydrochlorothiazide , carvedilol , hydrochlorothiazide , indapamide   Patient has a validated, automated, upper arm home BP cuff Current blood pressure readings:  6/20 174/86, after meds 164/80 6/21 168/82, after meds 150/81 6/23 147/74, after meds 137/72 6/24 150/77, after meds 137/72 6/25 160/90, after meds 175/80 6/26 160/83, after meds 137/70 6/27 155/82, after meds 145/63  Patient reports hypotensive s/sx including dizziness, lightheadedness when standing.  Patient denies  headache, shortness of breath  Objective: BP Readings from Last 3 Encounters:  04/24/24 (!) 172/86  02/28/24 130/70  12/05/23 124/76    Lab Results  Component Value Date   HGBA1C 6.2 04/24/2024    Lab Results  Component Value Date   CREATININE 1.56 (H) 04/24/2024   BUN 18 04/24/2024   NA 139 04/24/2024   K 4.0 04/24/2024   CL 104 04/24/2024   CO2 28 04/24/2024    Lab Results  Component Value Date   CHOL 149 04/24/2024   HDL 59.30 04/24/2024   LDLCALC 70 04/24/2024   LDLDIRECT 75.0 03/08/2016   TRIG 100.0 04/24/2024   CHOLHDL 3 04/24/2024    Medications Reviewed Today     Reviewed by Merceda Lela SAUNDERS, RPH (Pharmacist) on 05/24/24 at 1517  Med List Status: <None>   Medication Order Taking? Sig Documenting Provider Last Dose Status Informant  ACCRUFER  30 MG CAPS [id number] Yes SMARTSIG:1 Capsule(s) By Mouth Morning-Night [provider]  Active   albuterol  (VENTOLIN  HFA) 108 (90 Base) MCG/ACT inhaler [id number] Yes TAKE 2 PUFFS BY MOUTH EVERY 6 HOURS AS NEEDED FOR WHEEZE OR SHORTNESS OF SHERIDA Joshua Debby CROME, MD  Active   amLODipine  (NORVASC ) 5 MG tablet [id number] Yes Take 1.5 tablets (7.5 mg total) by mouth daily. Emelia Josefa HERO, NP  Active   atorvastatin  (LIPITOR) 40 MG tablet [id number] Yes Take 1 tablet (40 mg total) by mouth daily. Joshua Debby CROME, MD  Active   Blood Pressure Monitoring (BLOOD PRESSURE MONITOR/ARM) DEVI [id number] Yes 1 Device by Does not apply route daily. USE DAILY TO  TAKE BLOOD PRESSURE READINGS Anner Alm ORN, MD  Active Self  cetirizine (ZYRTEC) 10 MG tablet [id number] Yes Take 10 mg by mouth daily.  [provider]  Active Self  cholecalciferol  (VITAMIN D3) 25 MCG (1000 UNIT) tablet [id number] Yes Take 1,000 Units by mouth daily. [provider]  Active Self  docusate sodium  (COLACE) 100 MG capsule [id number] Yes Take 100 mg by mouth 2 (two) times daily. [provider]  Active Self  dorzolamide -timolol   (COSOPT ) 2-0.5 % ophthalmic solution [id number] Yes Instill 1 drop into both eyes twice a day   Active   empagliflozin (JARDIANCE) 25 MG TABS tablet [id number] Yes Take 1 tablet (25 mg total) by mouth daily. Joshua Debby CROME, MD  Active   Finerenone  (KERENDIA ) 20 MG TABS [id number] Yes Take 1 tablet (20 mg total) by mouth daily. Joshua Debby CROME, MD  Active   fluticasone  (FLONASE ) 50 MCG/ACT nasal spray [id number] Yes Place 2 sprays into both nostrils daily.  Patient taking differently: Place 2 sprays into both nostrils daily as needed for allergies.   Joshua Debby CROME, MD  Active Self  hydrALAZINE  (APRESOLINE ) 50 MG tablet [id number] Yes Take 1 tablet (50 mg total) by mouth 3 (three) times daily. Joshua Debby CROME, MD  Active   latanoprost  (XALATAN ) 0.005 % ophthalmic solution [id number] Yes Place 1 drop into both eyes every night at bedtime.   Active   levofloxacin (LEVAQUIN) 500 MG tablet [id number] Yes Take 500 mg by mouth daily. [provider]  Active   levothyroxine  (SYNTHROID ) 125 MCG tablet [id number] Yes Take 1 tablet (125 mcg total) by mouth daily. Shamleffer, Donell Cardinal, MD  Active   metoprolol  tartrate (LOPRESSOR ) 25 MG tablet [id number] Yes TAKE 1/2 TABLET BY MOUTH TWICE DAILY @ 9AM & 5PM Anner Alm ORN, MD  Active   Multiple Vitamins-Minerals (ONE-A-DAY 50 PLUS PO) [id number] Yes Take by mouth daily. [provider]  Active   omeprazole  (PRILOSEC) 40 MG capsule [id number] Yes TAKE ONE CAPSULE (40 MG TOTAL) BY MOUTH DAILY AT [place] Joshua Debby CROME, MD  Active   Polyethyl Glycol-Propyl Glycol 0.4-0.3 % SOLN [id number] Yes Place 1 drop into both eyes as needed (dry eyes). [provider]  Active Self  tamsulosin  (FLOMAX ) 0.4 MG CAPS capsule [id number] Yes TAKE ONE CAPSULE BY MOUTH DAILY AT 9AM AFTER OFILIA Joshua Debby CROME, MD  Active   thiamine  (VITAMIN B-1) 50 MG tablet [id number] Yes Take 1 tablet (50 mg total) by mouth daily. Joshua Debby CROME, MD  Active   TURMERIC  CURCUMIN PO [id number] Yes Take 1 tablet by mouth daily. [provider]  Active Self  valsartan  (DIOVAN ) 320 MG tablet [id number] Yes Take 1 tablet (320 mg total) by mouth daily. Joshua Debby CROME, MD  Active               Assessment/Plan:   Hypertension: - Currently uncontrolled, BP goal <130/80 - Reviewed long term cardiovascular and renal outcomes of uncontrolled blood pressure - Reviewed appropriate blood pressure monitoring technique and reviewed goal blood pressure. Recommended to check home blood pressure and heart rate at least 2 hours or more after taking AM medications. Keep a log. Will f/u for readings in  2 weeks - Increase amlodipine  to 10 mg daily    Follow Up Plan: 7/11  Darrelyn Drum, PharmD, BCPS, CPP Clinical Pharmacist Practitioner [place] [facility] [phone]

## 2024-05-27 ENCOUNTER — Other Ambulatory Visit (HOSPITAL_BASED_OUTPATIENT_CLINIC_OR_DEPARTMENT_OTHER): Payer: Self-pay

## 2024-05-30 ENCOUNTER — Encounter (INDEPENDENT_AMBULATORY_CARE_PROVIDER_SITE_OTHER): Payer: Self-pay

## 2024-06-04 ENCOUNTER — Encounter: Payer: Self-pay | Admitting: Internal Medicine

## 2024-06-04 ENCOUNTER — Ambulatory Visit: Payer: Medicare (Managed Care) | Admitting: Internal Medicine

## 2024-06-04 DIAGNOSIS — E0500 Thyrotoxicosis with diffuse goiter without thyrotoxic crisis or storm: Secondary | ICD-10-CM

## 2024-06-04 DIAGNOSIS — E890 Postprocedural hypothyroidism: Secondary | ICD-10-CM

## 2024-06-04 MED ORDER — LEVOTHYROXINE SODIUM 125 MCG PO TABS: 125.0000 ug | ORAL_TABLET | Freq: Every day | ORAL | 3 refills | Status: DC

## 2024-06-04 NOTE — Progress Notes (Signed)
 Name: Ronald Bond  MRN/ DOB: [id number], 22-May-1946    Age/ Sex: 78 y.o., male     PCP: Ronald Debby CROME, MD   Reason for Endocrinology Evaluation:  Ronald Bond' disease     Initial Endocrinology Clinic Visit: 10/07/2019    PATIENT IDENTIFIER: [name] is a 79 y.o., male with a past medical history of Graves' disease, HTN, dyslipidemia and CHF. He has followed with [facility] since 10/07/2019 for consultative assistance with management of his Graves' disease  HISTORICAL SUMMARY: The patient was first diagnosed with hyperthyroidism 1974 , requiring total thyroidectomy with benign pathology, but a couple of years later the pt was noted with hyperthyroidism again due tissue regrowth, requiring  RAI ablation , and was on LT-4 replacement until 07/2019 when this was stopped due to a low TSH 0.12 uIU/mL   But the patient restarted his L T4 replacement weeks later due to palpitation and abnormal sensation.  Pt [place] in the [place] area for [place] [employer] in [place] and while in [place] he reported to [facility] due to right eye diplopia and inability to move his eye , which he describes as locked An MRI 08/02/2019 showed asymmetric enlargement right inferior rectus muscle and to a lesser degree left inferior rectus muscle , additionally b/l medial and lateral rectus muscles also demonstrate mild enlargement consistent with Grave's Orbitopathy.  He was treated with prednisone , without any improvement in his vision.  He was unable to obtain the [place] due to shortage per patient.  He is S/P radiation therapy x3  in [place]   In 2021    He normally sees Dr. Octavia locally and has an appointment to see him today    Mother with graves' disease    SUBJECTIVE:   Today (06/04/2024):  Ronald Bond is here for follow-up on Graves' disease.     He completed radiation therapy for Grave's orbitopathy in [place].  S/P right eye sx 05/2020 and 01/2021  Orbital CT 11/23/2023   showed no significant change from 2022, including asymmetric enlargement of the right inferior rectus muscle  Had a follow with ophthalmology last month, pending sx next month on the right eye. Has noted worsening double vision of the  riht eye  No burning or itching  Weight continues to fluctuate Denies local neck swelling  No palpitations  No tremors  Denies or diarrhea   Levothyroxine  125 mcg daily     HISTORY:  Past Medical History:  Past Medical History:  Diagnosis Date   Allergy    Anemia    Asthma    Cataract    Chronic kidney disease    Diabetes mellitus without complication (HCC)    GERD (gastroesophageal reflux disease)    Glaucoma    HTN (hypertension)    Hyperlipidemia    Hypothyroidism    Osteoarthritis    Pneumonia    Pre-diabetes    Sleep apnea    no CPAP   Wears glasses    Past Surgical History:  Past Surgical History:  Procedure Laterality Date   COLONOSCOPY     EYE SURGERY  2006   both cataracts   JOINT REPLACEMENT     MICROLARYNGOSCOPY Left 11/18/2013   Procedure: MICROLARYNGOSCOPY WITH REMOVAL OF GRANULOMA LEFT SIDE;  Surgeon: Ronald Loader, MD;  Location: [facility];  Service: ENT;  Laterality: Left;   NM MYOVIEW  LTD  06/2007   Low risk, normal.  No evidence of ischemia or infarction  THYROIDECTOMY  1974   TONSILLECTOMY     as a child   TOTAL KNEE ARTHROPLASTY  2009   left   TOTAL KNEE ARTHROPLASTY Right 02/27/2023   Procedure: TOTAL KNEE ARTHROPLASTY;  Surgeon: Ronald Toribio LABOR, MD;  Location: [facility];  Service: Orthopedics;  Laterality: Right;   TOTAL KNEE REVISION Left 07/06/2022   Procedure: TOTAL KNEE REVISION;  Surgeon: Ronald Toribio LABOR, MD;  Location: [facility];  Service: Orthopedics;  Laterality: Left;   Social History:  reports that he has never smoked. He has never used smokeless tobacco. He reports that he does not currently use alcohol . He reports that he does not use drugs. Family History:  Family History   Problem Relation Age of Onset   Brain cancer Mother    Heart disease Father    Heart disease Brother      HOME MEDICATIONS: Allergies as of 06/04/2024       Reactions   Lisinopril Other (See Comments)   Edema in face        Medication List        Accurate as of June 04, 2024  9:19 AM. If you have any questions, ask your nurse or doctor.          ACCRUFeR  30 MG Caps Generic drug: Ferric Maltol  SMARTSIG:1 Capsule(s) By Mouth Morning-Night   albuterol  108 (90 Base) MCG/ACT inhaler Commonly known as: VENTOLIN  HFA TAKE 2 PUFFS BY MOUTH EVERY 6 HOURS AS NEEDED FOR WHEEZE OR SHORTNESS OF BREATH   amLODipine  10 MG tablet Commonly known as: NORVASC  Take 1 tablet (10 mg total) by mouth daily.   amoxicillin 500 MG capsule Commonly known as: AMOXIL Take 2,000 mg by mouth once.   atorvastatin  40 MG tablet Commonly known as: LIPITOR Take 1 tablet (40 mg total) by mouth daily.   Blood Pressure Monitor/Arm Devi 1 Device by Does not apply route daily. USE DAILY TO  TAKE BLOOD PRESSURE READINGS   cetirizine 10 MG tablet Commonly known as: ZYRTEC Take 10 mg by mouth daily.   cholecalciferol  25 MCG (1000 UNIT) tablet Commonly known as: VITAMIN D3 Take 1,000 Units by mouth daily.   docusate sodium  100 MG capsule Commonly known as: COLACE Take 100 mg by mouth 2 (two) times daily.   dorzolamide -timolol  2-0.5 % ophthalmic solution Commonly known as: COSOPT  Instill 1 drop into both eyes twice a day   empagliflozin 25 MG Tabs tablet Commonly known as: Jardiance Take 1 tablet (25 mg total) by mouth daily.   fluticasone  50 MCG/ACT nasal spray Commonly known as: FLONASE  Place 2 sprays into both nostrils daily. What changed:  when to take this reasons to take this   hydrALAZINE  50 MG tablet Commonly known as: APRESOLINE  Take 1 tablet (50 mg total) by mouth 3 (three) times daily.   Kerendia  20 MG Tabs Generic drug: Finerenone  Take 1 tablet (20 mg total) by mouth  daily.   latanoprost  0.005 % ophthalmic solution Commonly known as: XALATAN  Place 1 drop into both eyes every night at bedtime.   levofloxacin 500 MG tablet Commonly known as: LEVAQUIN Take 500 mg by mouth daily.   levothyroxine  125 MCG tablet Commonly known as: SYNTHROID  Take 1 tablet (125 mcg total) by mouth daily.   metoprolol  tartrate 25 MG tablet Commonly known as: LOPRESSOR  TAKE 1/2 TABLET BY MOUTH TWICE DAILY @ 9AM & 5PM   omeprazole  40 MG capsule Commonly known as: PRILOSEC TAKE ONE CAPSULE (40 MG TOTAL) BY MOUTH DAILY AT 9AM  ONE-A-DAY 50 PLUS PO Take by mouth daily.   Polyethyl Glycol-Propyl Glycol 0.4-0.3 % Soln Place 1 drop into both eyes as needed (dry eyes).   tamsulosin  0.4 MG Caps capsule Commonly known as: FLOMAX  TAKE ONE CAPSULE BY MOUTH DAILY AT 9AM AFTER BREAKFAST   thiamine  50 MG tablet Commonly known as: VITAMIN B-1 Take 1 tablet (50 mg total) by mouth daily.   TURMERIC CURCUMIN PO Take 1 tablet by mouth daily.   valsartan  320 MG tablet Commonly known as: DIOVAN  Take 1 tablet (320 mg total) by mouth daily.          OBJECTIVE:   PHYSICAL EXAM: VS: BP 122/80 (BP Location: Left Arm, Patient Position: Sitting, Cuff Size: Normal)   Pulse (!) 58   Ht 5' 10 (1.778 m)   Wt 210 lb (95.3 kg)   SpO2 97%   BMI 30.13 kg/m    EXAM: General: Pt appears well and is in NAD  Eyes: External eye exam shows right eye proptosis  Neck: General: Supple without adenopathy. Thyroid : No goiter or nodules appreciated.   Lungs: Clear with good BS bilat   Heart: Auscultation: RRR.  Extremities:  BL LE: No pretibial edema   Mental Status: Judgment, insight: Intact Memory: Intact for recent and remote events Mood and affect: No depression, anxiety, or agitation     DATA REVIEWED:   Latest Reference Range & Units 04/24/24 08:46  TSH 0.35 - 5.50 uIU/mL 3.74    07/2019 TSI : 474 (H)      ASSESSMENT / PLAN / RECOMMENDATIONS:   Graves'  disease, status post thyroidectomy followed by radioactive iodine  ablation:  -The patient is clinically euthyroid - No local neck symptoms  -TSH normal, no change  Medications   Continue Levothyroxine  125 MCG daily  2. Graves' Orbitopathy :    - S/P radiation therapy, prednisone  therapy and recently sx on the right eye - Diplopia resolved - Continues to follow up at [facility]       Follow-up in 1 yr        Signed electronically by: Stefano Redgie Butts, MD  [facility] End[facility]ology  [facility] [address] [place], [place] [address] Phone: [phone] FAX: [phone]      CC: Ronald Debby CROME, MD [address] [place] [address] Phone: [phone]  Fax: [phone]   Return to Endocrinology clinic as below: Future Appointments  Date Time Provider Department Center  06/07/2024  3:00 PM [facility] PHARMACIST [facility] None  05/08/2025 11:30 AM [facility]-ANNUAL WELLNESS VISIT [facility] None

## 2024-06-04 NOTE — Patient Instructions (Signed)

## 2024-06-05 ENCOUNTER — Other Ambulatory Visit: Payer: Self-pay | Admitting: Internal Medicine

## 2024-06-05 DIAGNOSIS — J4520 Mild intermittent asthma, uncomplicated: Secondary | ICD-10-CM

## 2024-06-07 ENCOUNTER — Other Ambulatory Visit (INDEPENDENT_AMBULATORY_CARE_PROVIDER_SITE_OTHER): Payer: Medicare (Managed Care) | Admitting: Pharmacist

## 2024-06-07 DIAGNOSIS — I10 Essential (primary) hypertension: Secondary | ICD-10-CM

## 2024-06-07 NOTE — Progress Notes (Signed)
 06/07/2024 Name: Ronald Bond MRN: [id number] DOB: Mar 23, 1946  Chief Complaint  Patient presents with   Hypertension   Medication Management    Ronald Bond is a 78 y.o. year old male who presented for a telephone visit.   They were referred to the pharmacist by their PCP for assistance in managing hypertension.   Subjective:  Care Team: Primary Care Provider: Joshua Debby CROME, MD ; Next Scheduled Visit: none scheduled   Medication Access/Adherence  Current Pharmacy:  [facility] [phone] [facility] [place], [place] - [address] AT [facility] [address] [place] [address] Phone: [phone] Fax: [phone]  [facility] - [place], [place] - [address] [place] [address] Phone: [phone] Fax: [phone]  [facility] [place], [place] - [address] [place] [address] Phone: [phone] Fax: [phone]   Patient reports affordability concerns with their medications: No  Patient reports access/transportation concerns to their pharmacy: No  Patient reports adherence concerns with their medications:  No     Hypertension:  Current medications: amlodipine  10 mg daily, hydralazine  50 mg TID, valsartan  320 mg daily, metoprolol  tartrate 25 mg BID  Medications previously tried: amlodipine /valsartan /hydrochlorothiazide , carvedilol , hydrochlorothiazide , indapamide   Patient has a validated, automated, upper arm home BP cuff Current blood pressure readings:  This morning 166/92, after meds 134/61 Yesterday 155/81, after meds 136/69  Prior to amlodipine  10 mg: 6/20 174/86, after meds 164/80 6/21 168/82, after meds 150/81 6/23 147/74, after meds 137/72 6/24 150/77, after meds 137/72 6/25 160/90, after meds 175/80 6/26 160/83, after meds 137/70 6/27 155/82, after meds 145/63  Patient denies hypotensive s/sx including dizziness,  lightheadedness when standing.  Patient denies headache, shortness of breath  Objective: BP Readings from Last 3 Encounters:  06/04/24 122/80  04/24/24 (!) 172/86  02/28/24 130/70    Lab Results  Component Value Date   HGBA1C 6.2 04/24/2024    Lab Results  Component Value Date   CREATININE 1.56 (H) 04/24/2024   BUN 18 04/24/2024   NA 139 04/24/2024   K 4.0 04/24/2024   CL 104 04/24/2024   CO2 28 04/24/2024    Lab Results  Component Value Date   CHOL 149 04/24/2024   HDL 59.30 04/24/2024   LDLCALC 70 04/24/2024   LDLDIRECT 75.0 03/08/2016   TRIG 100.0 04/24/2024   CHOLHDL 3 04/24/2024    Medications Reviewed Today     Reviewed by Merceda Lela SAUNDERS, RPH (Pharmacist) on 06/07/24 at 1550  Med List Status: <None>   Medication Order Taking? Sig Documenting Provider Last Dose Status Informant  ACCRUFER  30 MG CAPS [id number]  SMARTSIG:1 Capsule(s) By Mouth Morning-Night [provider]  Active   albuterol  (VENTOLIN  HFA) 108 (90 Base) MCG/ACT inhaler [id number]  INHALE TWO PUFFS BY MOUTH EVERY 6 HOURS AS NEEDED FOR WHEEZING OR FOR SHORTNESS OF SHERIDA Joshua Debby CROME, MD  Active   amLODipine  (NORVASC ) 10 MG tablet [id number] Yes Take 1 tablet (10 mg total) by mouth daily. Joshua Debby CROME, MD  Active   amoxicillin (AMOXIL) 500 MG capsule [id number]  Take 2,000 mg by mouth once. [provider]  Active   atorvastatin  (LIPITOR) 40 MG tablet [id number]  Take 1 tablet (40 mg total) by mouth daily. Joshua Debby CROME, MD  Active   Blood Pressure Monitoring (BLOOD PRESSURE MONITOR/ARM) DEVI [id number]  1 Device by Does not apply route daily. USE DAILY TO  TAKE BLOOD PRESSURE READINGS Anner Alm ORN, MD  Active Self  cetirizine (ZYRTEC) 10 MG tablet [id number]  Take 10 mg by mouth daily. [provider]  Active Self  cholecalciferol  (VITAMIN D3) 25 MCG (1000 UNIT) tablet [id number]  Take 1,000 Units by mouth daily. [provider]  Active Self  docusate  sodium (COLACE) 100 MG capsule [id number]  Take 100 mg by mouth 2 (two) times daily. [provider]  Active Self  dorzolamide -timolol  (COSOPT ) 2-0.5 % ophthalmic solution [id number]  Instill 1 drop into both eyes twice a day   Active   empagliflozin (JARDIANCE) 25 MG TABS tablet [id number]  Take 1 tablet (25 mg total) by mouth daily. Joshua Debby CROME, MD  Active   Finerenone  (KERENDIA ) 20 MG TABS [id number]  Take 1 tablet (20 mg total) by mouth daily. Joshua Debby CROME, MD  Active   fluticasone  (FLONASE ) 50 MCG/ACT nasal spray [id number]  Place 2 sprays into both nostrils daily.  Patient taking differently: Place 2 sprays into both nostrils daily as needed for allergies.   Joshua Debby CROME, MD  Active Self  hydrALAZINE  (APRESOLINE ) 50 MG tablet [id number] Yes Take 1 tablet (50 mg total) by mouth 3 (three) times daily. Joshua Debby CROME, MD  Active   latanoprost  (XALATAN ) 0.005 % ophthalmic solution [id number]  Place 1 drop into both eyes every night at bedtime.   Active   levofloxacin (LEVAQUIN) 500 MG tablet [id number]  Take 500 mg by mouth daily. [provider]  Active   levothyroxine  (SYNTHROID ) 125 MCG tablet [id number]  Take 1 tablet (125 mcg total) by mouth daily. Shamleffer, Ibtehal Jaralla, MD  Active   metoprolol  tartrate (LOPRESSOR ) 25 MG tablet [id number] Yes TAKE 1/2 TABLET BY MOUTH TWICE DAILY @ 9AM & 5PM Anner Alm ORN, MD  Active   Multiple Vitamins-Minerals (ONE-A-DAY 50 PLUS PO) [id number]  Take by mouth daily. [provider]  Active   omeprazole  (PRILOSEC) 40 MG capsule [id number]  TAKE ONE CAPSULE (40 MG TOTAL) BY MOUTH DAILY AT [place]AN[place] Joshua Debby CROME, MD  Active   Polyethyl Glycol-Propyl Glycol 0.4-0.3 % SOLN [id number]  Place 1 drop into both eyes as needed (dry eyes). [provider]  Active Self  tamsulosin  (FLOMAX ) 0.4 MG CAPS capsule [id number]  TAKE ONE CAPSULE BY MOUTH DAILY AT 9AM AFTER OFILIA Joshua Debby CROME, MD  Active   thiamine  (VITAMIN B-1) 50 MG  tablet [id number]  Take 1 tablet (50 mg total) by mouth daily. Joshua Debby CROME, MD  Active   TURMERIC CURCUMIN PO [id number]  Take 1 tablet by mouth daily. [provider]  Active Self  valsartan  (DIOVAN ) 320 MG tablet [id number] Yes Take 1 tablet (320 mg total) by mouth daily. Joshua Debby CROME, MD  Active               Assessment/Plan:   Hypertension: - Currently controlled, BP goal <130/80 - BP improved after increasing amlodipine  to 10 mg daily. Systolic slightly higher at home however BP was at goal at endo appt this week - Reviewed long term cardiovascular and renal outcomes of uncontrolled blood pressure - Reviewed appropriate blood pressure monitoring technique and reviewed goal blood pressure. Recommended to check home blood pressure and heart rate at least 2 hours or more after taking AM medications. Keep a log.  - Continue current regimen    Follow Up Plan: F/u with PCP - appt scheduled for 8/28  Darrelyn Drum, PharmD, BCPS, CPP Clinical Pharmacist Practitioner [facility]  at [facility] [phone]

## 2024-06-07 NOTE — Patient Instructions (Signed)
 It was a pleasure speaking with you today!  Your blood pressure is doing much better. Continue your current regimen.  Feel free to call with any questions or concerns!  Darrelyn Drum, PharmD, BCPS, CPP Clinical Pharmacist Practitioner [facility] at [facility] [phone]

## 2024-06-09 DIAGNOSIS — R001 Bradycardia, unspecified: Secondary | ICD-10-CM | POA: Diagnosis not present

## 2024-06-12 ENCOUNTER — Other Ambulatory Visit: Payer: Self-pay | Admitting: Internal Medicine

## 2024-06-12 DIAGNOSIS — N1832 Chronic kidney disease, stage 3b: Secondary | ICD-10-CM

## 2024-06-12 DIAGNOSIS — E118 Type 2 diabetes mellitus with unspecified complications: Secondary | ICD-10-CM

## 2024-06-13 ENCOUNTER — Other Ambulatory Visit (HOSPITAL_BASED_OUTPATIENT_CLINIC_OR_DEPARTMENT_OTHER): Payer: Self-pay

## 2024-06-17 NOTE — Telephone Encounter (Signed)
 Copied from CRM [id number]. Topic: Clinical - Prescription Issue >> Jun 17, 2024  9:48 AM Drema MATSU wrote: Reason for CRM: Patient is calling to check on JARDIANCE 25 MG TABS tablet . He stated that he has been out of medication for 3 days.

## 2024-06-18 ENCOUNTER — Other Ambulatory Visit (HOSPITAL_BASED_OUTPATIENT_CLINIC_OR_DEPARTMENT_OTHER): Payer: Self-pay

## 2024-06-21 ENCOUNTER — Other Ambulatory Visit: Payer: Self-pay | Admitting: Internal Medicine

## 2024-06-21 DIAGNOSIS — E785 Hyperlipidemia, unspecified: Secondary | ICD-10-CM

## 2024-06-24 ENCOUNTER — Telehealth: Payer: Self-pay | Admitting: Internal Medicine

## 2024-06-24 NOTE — Telephone Encounter (Signed)
 Copied from CRM #[id number]. Topic: Clinical - Prescription Issue >> Jun 24, 2024 11:27 AM Chasity T wrote: Reason for CRM: Lonell a Pharmacy rep on [employer] is calling in for patient regarding a 90 day supply request for  valsartan  (DIOVAN ) 320 MG tablet and  atorvastatin  (LIPITOR) 40 MG tablet to be sent to his pharmacy [facility]

## 2024-06-25 ENCOUNTER — Other Ambulatory Visit: Payer: Self-pay | Admitting: Internal Medicine

## 2024-06-25 LAB — HM DIABETES EYE EXAM

## 2024-06-26 ENCOUNTER — Other Ambulatory Visit: Payer: Self-pay

## 2024-06-26 DIAGNOSIS — E785 Hyperlipidemia, unspecified: Secondary | ICD-10-CM

## 2024-06-26 MED ORDER — ATORVASTATIN CALCIUM 40 MG PO TABS: 40.0000 mg | ORAL_TABLET | Freq: Every day | ORAL | 1 refills | Status: DC

## 2024-06-26 MED ORDER — VALSARTAN 320 MG PO TABS: 320.0000 mg | ORAL_TABLET | Freq: Every day | ORAL | 1 refills | Status: DC

## 2024-06-26 NOTE — Telephone Encounter (Signed)
 Medications has been refilled. Spoke with patient to confirm the correct pharmacy.

## 2024-07-01 ENCOUNTER — Other Ambulatory Visit (HOSPITAL_BASED_OUTPATIENT_CLINIC_OR_DEPARTMENT_OTHER): Payer: Self-pay

## 2024-07-02 ENCOUNTER — Other Ambulatory Visit: Payer: Self-pay

## 2024-07-02 DIAGNOSIS — E890 Postprocedural hypothyroidism: Secondary | ICD-10-CM

## 2024-07-02 DIAGNOSIS — E0500 Thyrotoxicosis with diffuse goiter without thyrotoxic crisis or storm: Secondary | ICD-10-CM

## 2024-07-02 MED ORDER — LEVOTHYROXINE SODIUM 125 MCG PO TABS: 125.0000 ug | ORAL_TABLET | Freq: Every day | ORAL | 3 refills | Status: DC

## 2024-07-20 ENCOUNTER — Other Ambulatory Visit: Payer: Self-pay | Admitting: Internal Medicine

## 2024-07-20 DIAGNOSIS — I10 Essential (primary) hypertension: Secondary | ICD-10-CM

## 2024-07-20 DIAGNOSIS — I517 Cardiomegaly: Secondary | ICD-10-CM

## 2024-07-20 DIAGNOSIS — I119 Hypertensive heart disease without heart failure: Secondary | ICD-10-CM

## 2024-07-25 ENCOUNTER — Ambulatory Visit: Payer: Medicare (Managed Care) | Admitting: Internal Medicine

## 2024-07-30 ENCOUNTER — Other Ambulatory Visit (HOSPITAL_BASED_OUTPATIENT_CLINIC_OR_DEPARTMENT_OTHER): Payer: Self-pay

## 2024-07-31 ENCOUNTER — Other Ambulatory Visit (HOSPITAL_BASED_OUTPATIENT_CLINIC_OR_DEPARTMENT_OTHER): Payer: Self-pay

## 2024-07-31 ENCOUNTER — Other Ambulatory Visit: Payer: Self-pay | Admitting: Internal Medicine

## 2024-07-31 DIAGNOSIS — N401 Enlarged prostate with lower urinary tract symptoms: Secondary | ICD-10-CM

## 2024-08-01 ENCOUNTER — Encounter: Payer: Self-pay | Admitting: Cardiology

## 2024-08-06 ENCOUNTER — Telehealth: Payer: Self-pay | Admitting: Radiology

## 2024-08-06 NOTE — Telephone Encounter (Signed)
 Copied from CRM #[id number]. Topic: Clinical - Medication Question >> Aug 06, 2024  9:40 AM Burnard DEL wrote: Reason for CRM: [facility] to check on status of medication tamsulosin  (FLOMAX ) 0.4 MG CAPS capsule being filled for patient?

## 2024-08-09 NOTE — Telephone Encounter (Signed)
 Medication was refilled on 9/9

## 2024-08-16 ENCOUNTER — Ambulatory Visit (INDEPENDENT_AMBULATORY_CARE_PROVIDER_SITE_OTHER): Payer: Medicare (Managed Care) | Admitting: Audiology

## 2024-08-16 ENCOUNTER — Ambulatory Visit (INDEPENDENT_AMBULATORY_CARE_PROVIDER_SITE_OTHER): Payer: Medicare (Managed Care) | Admitting: Otolaryngology

## 2024-08-16 ENCOUNTER — Encounter (INDEPENDENT_AMBULATORY_CARE_PROVIDER_SITE_OTHER): Payer: Self-pay | Admitting: Otolaryngology

## 2024-08-16 VITALS — BP 158/66 | HR 45 | Temp 97.0°F

## 2024-08-16 DIAGNOSIS — H903 Sensorineural hearing loss, bilateral: Secondary | ICD-10-CM

## 2024-08-16 NOTE — Progress Notes (Signed)
  [address] [place], [place] [address] [phone]  Audiological Evaluation    Name: Ronald Bond     DOB:   Apr 18, 1946      MRN:   [id number]                                                                                     Service Date: 08/16/2024     Accompanied by: unaccompanied   Patient comes today after Dr. Soldatova, ENT sent a referral for a hearing evaluation due to concerns with hearing loss.   Symptoms Yes Details  Hearing loss  [x]  In both ears, gradually worsening with time  Tinnitus  []    Ear pain/ infections/pressure  []    Balance problems  []    Noise exposure history  [x]  Occupational - had hearing screenings at work  Previous ear surgeries  []    Family history of hearing loss  []    Amplification  [x]  Said tried amplification from [employer] ( [employer] ) and it did not help him  Other  []      Otoscopy: Right ear: Clear external ear canal and notable landmarks visualized on the tympanic membrane. Left ear:  Clear external ear canal and notable landmarks visualized on the tympanic membrane.  Tympanometry: Right ear: Type As- Normal external ear canal volume with normal middle ear pressure and low tympanic membrane compliance. Left ear: Type As- Normal external ear canal volume with normal middle ear pressure and low tympanic membrane compliance.  Pure tone Audiometry: Right ear- Normal to mild sensorineural hearing loss from 125 Hz - 8000 Hz. Left ear-  Normal to mild  sensorineural hearing loss from 125 Hz - 8000 Hz.  Speech Audiometry: Right ear- Speech Reception Threshold (SRT) was obtained at 30 dBHL. Left ear-Speech Reception Threshold (SRT) was obtained at 30 dBHL.   Word Recognition Score Tested using NU-6 (recorded) Right ear: 100% was obtained at a presentation level of 70 dBHL with contralateral masking which is deemed as  excellent. Left ear: 100% was obtained at a presentation level of 70 dBHL with contralateral masking  which is deemed as  excellent.   The hearing test results were completed under headphones and results are deemed to be of good reliability. Test technique:  conventional    Impression: There is not a significant difference in pure-tone thresholds between ears. There is not a significant difference in the word recognition score in between ears.    Recommendations: Follow up with ENT as scheduled for today. Return for a hearing evaluation if concerns with hearing changes arise or per MD recommendation. Consider a communication needs assessment after medical clearance for hearing aids is obtained.   Alphus Zeck MARIE LEROUX-MARTINEZ, AUD

## 2024-08-16 NOTE — Progress Notes (Signed)
 ENT CONSULT:  Reason for Consult: decreased hearing    HPI: 55 yoM who is here for persistently decreased hearing for several months. Denies tinnitus, denies pain, denies dizziness, denies drainage. No prior ear surgeries. Denies allergies to pollen or dust.   Hearing test today with mild SNHL symmetric and with mid to high-frequencies affected. Type As typms, 100% WDS.   Past Medical History:  Diagnosis Date   Allergy    Anemia    Asthma    Cataract    Chronic kidney disease    Diabetes mellitus without complication (HCC)    GERD (gastroesophageal reflux disease)    Glaucoma    HTN (hypertension)    Hyperlipidemia    Hypothyroidism    Osteoarthritis    Pneumonia    Pre-diabetes    Sleep apnea    no CPAP   Wears glasses     Past Surgical History:  Procedure Laterality Date   COLONOSCOPY     EYE SURGERY  2006   both cataracts   JOINT REPLACEMENT     MICROLARYNGOSCOPY Left 11/18/2013   Procedure: MICROLARYNGOSCOPY WITH REMOVAL OF GRANULOMA LEFT SIDE;  Surgeon: Ida Loader, MD;  Location: [facility];  Service: ENT;  Laterality: Left;   NM MYOVIEW  LTD  06/2007   Low risk, normal.  No evidence of ischemia or infarction   THYROIDECTOMY  1974   TONSILLECTOMY     as a child   TOTAL KNEE ARTHROPLASTY  2009   left   TOTAL KNEE ARTHROPLASTY Right 02/27/2023   Procedure: TOTAL KNEE ARTHROPLASTY;  Surgeon: Edna Toribio LABOR, MD;  Location: [facility];  Service: Orthopedics;  Laterality: Right;   TOTAL KNEE REVISION Left 07/06/2022   Procedure: TOTAL KNEE REVISION;  Surgeon: Edna Toribio LABOR, MD;  Location: [facility];  Service: Orthopedics;  Laterality: Left;    Family History  Problem Relation Age of Onset   Brain cancer Mother    Heart disease Father    Heart disease Brother     Social History:  reports that he has never smoked. He has never used smokeless tobacco. He reports that he does not currently use alcohol . He reports that he does not use  drugs.  Allergies:  Allergies  Allergen Reactions   Lisinopril Other (See Comments)    Edema in face    Medications: I have reviewed the patient's current medications.  The PMH, PSH, Medications, Allergies, and SH were reviewed and updated.  ROS: Constitutional: Negative for fever, weight loss and weight gain. Cardiovascular: Negative for chest pain and dyspnea on exertion. Respiratory: Is not experiencing shortness of breath at rest. Gastrointestinal: Negative for nausea and vomiting. Neurological: Negative for headaches. Psychiatric: The patient is not nervous/anxious  Blood pressure (!) 158/66, pulse (!) 45, temperature (!) 97 F (36.1 C), SpO2 98%.  PHYSICAL EXAM:  Exam: General: Well-developed, well-nourished Communication and Voice: Clear pitch and clarity Respiratory Respiratory effort: Equal inspiration and expiration without stridor Cardiovascular Peripheral Vascular: Warm extremities with equal color/perfusion Eyes: No nystagmus with equal extraocular motion bilaterally Neuro/Psych/Balance: Patient oriented to person, place, and time; Appropriate mood and affect; Gait is intact with no imbalance; Cranial nerves I-XII are intact Head and Face Inspection: Normocephalic and atraumatic without mass or lesion Facial Strength: Facial motility symmetric and full bilaterally ENT Pinna: External ear intact and fully developed External canal: Canal is patent with intact skin Tympanic Membrane: Clear and mobile External Nose: No scar or anatomic deformity Internal Nose: Septum intact and midline. No  edema, polyp, or rhinorrhea Lips, Teeth, and gums: Mucosa and teeth intact and viable TMJ: No pain to palpation with full mobility Oral cavity/oropharynx: No erythema or exudate, no lesions present Neck Neck and Trachea: Midline trachea without mass or lesion Thyroid : No mass or nodularity Lymphatics: No lymphadenopathy  Studies Reviewed: Audio today   Tympanometry: Right ear: Type As- Normal external ear canal volume with normal middle ear pressure and low tympanic membrane compliance. Left ear: Type As- Normal external ear canal volume with normal middle ear pressure and low tympanic membrane compliance.   Pure tone Audiometry: Right ear- Normal to mild sensorineural hearing loss from 125 Hz - 8000 Hz. Left ear-  Normal to mild  sensorineural hearing loss from 125 Hz - 8000 Hz.   Speech Audiometry: Right ear- Speech Reception Threshold (SRT) was obtained at 30 dBHL. Left ear-Speech Reception Threshold (SRT) was obtained at 30 dBHL.   Word Recognition Score Tested using NU-6 (recorded) Right ear: 100% was obtained at a presentation level of 70 dBHL with contralateral masking which is deemed as  excellent. Left ear: 100% was obtained at a presentation level of 70 dBHL with contralateral masking which is deemed as  excellent.  Assessment/Plan: Encounter Diagnosis  Name Primary?   Sensorineural hearing loss (SNHL) of both ears Yes   Symmetric mild bilateral SNHL  Likely age-related, no difficulties with communication Audio: mild SNHL symmetric and with mid to high-frequencies affected. Type As typms, 100% WDS. - discussed hearing protection - annual hearing test   Thank you for allowing me to participate in the care of this patient. Please do not hesitate to contact me with any questions or concerns.   Elena Larry, MD Otolaryngology [facility] Phone: [phone] Fax: [phone]    08/16/2024, 12:26 PM

## 2024-08-16 NOTE — Progress Notes (Signed)
 Patient explains that he has a low resting BPM. Patient is having BP managed.

## 2024-08-20 ENCOUNTER — Other Ambulatory Visit: Payer: Self-pay | Admitting: Internal Medicine

## 2024-08-20 DIAGNOSIS — J4520 Mild intermittent asthma, uncomplicated: Secondary | ICD-10-CM

## 2024-08-21 ENCOUNTER — Other Ambulatory Visit: Payer: Self-pay | Admitting: Internal Medicine

## 2024-08-21 DIAGNOSIS — J4520 Mild intermittent asthma, uncomplicated: Secondary | ICD-10-CM

## 2024-08-21 NOTE — Telephone Encounter (Signed)
 Copied from CRM [id number]. Topic: Clinical - Medication Refill >> Aug 21, 2024  5:17 PM Leah C wrote: Medication: albuterol  (VENTOLIN  HFA) 108 (90 Base) MCG/ACT inhaler  Has the patient contacted their pharmacy? Pharmacy called  (Agent: If no, request that the patient contact the pharmacy for the refill. If patient does not wish to contact the pharmacy document the reason why and proceed with request.) (Agent: If yes, when and what did the pharmacy advise?)  This is the patient's preferred pharmacy:   [facility] - [place] - [address] [place] [address] Phone: [phone] Fax: [phone]  Is this the correct pharmacy for this prescription? Yes If no, delete pharmacy and type the correct one.   Has the prescription been filled recently? No  Is the patient out of the medication? Yes  Has the patient been seen for an appointment in the last year OR does the patient have an upcoming appointment? Yes  Can we respond through MyChart? Yes  Agent: Please be advised that Rx refills may take up to 3 business days. We ask that you follow-up with your pharmacy.

## 2024-08-23 ENCOUNTER — Other Ambulatory Visit (HOSPITAL_BASED_OUTPATIENT_CLINIC_OR_DEPARTMENT_OTHER): Payer: Self-pay

## 2024-08-23 MED ORDER — FINASTERIDE 5 MG PO TABS
5.0000 mg | ORAL_TABLET | Freq: Every day | ORAL | 3 refills | Status: AC
  Filled 2024-08-23: qty 90, 90d supply, fill #0
  Filled 2024-11-21: qty 90, 90d supply, fill #1

## 2024-08-24 ENCOUNTER — Other Ambulatory Visit: Payer: Self-pay | Admitting: Internal Medicine

## 2024-08-24 DIAGNOSIS — I10 Essential (primary) hypertension: Secondary | ICD-10-CM

## 2024-08-28 ENCOUNTER — Other Ambulatory Visit: Payer: Self-pay | Admitting: Internal Medicine

## 2024-08-28 DIAGNOSIS — N1832 Chronic kidney disease, stage 3b: Secondary | ICD-10-CM

## 2024-08-28 DIAGNOSIS — E1129 Type 2 diabetes mellitus with other diabetic kidney complication: Secondary | ICD-10-CM

## 2024-08-28 DIAGNOSIS — E519 Thiamine deficiency, unspecified: Secondary | ICD-10-CM

## 2024-09-03 ENCOUNTER — Telehealth: Payer: Self-pay | Admitting: Internal Medicine

## 2024-09-03 NOTE — Telephone Encounter (Unsigned)
 Copied from CRM [id number]. Topic: Clinical - Medication Refill >> Sep 03, 2024  4:43 PM Alfonso HERO wrote: Medication: thiamine  (VITAMIN B-1) 50 MG tablet Finerenone  (KERENDIA ) 20 MG TABS  Has the patient contacted their pharmacy? Yes (Agent: If no, request that the patient contact the pharmacy for the refill. If patient does not wish to contact the pharmacy document the reason why and proceed with request.) (Agent: If yes, when and what did the pharmacy advise?)  This is the patient's preferred pharmacy:  [id number] [place], [place] - [address] AT [facility] [address] [place] [address] Phone: [phone] Fax: [phone]  [facility] [place] - [address] [place] [address] Phone: [phone] Fax: [phone]  Is this the correct pharmacy for this prescription? Yes If no, delete pharmacy and type the correct one.   Has the prescription been filled recently? Yes  Is the patient out of the medication? Yes  Has the patient been seen for an appointment in the last year OR does the patient have an upcoming appointment? Yes  Can we respond through MyChart? Yes  Agent: Please be advised that Rx refills may take up to 3 business days. We ask that you follow-up with your pharmacy.

## 2024-09-07 ENCOUNTER — Other Ambulatory Visit: Payer: Self-pay | Admitting: Internal Medicine

## 2024-09-07 DIAGNOSIS — J4520 Mild intermittent asthma, uncomplicated: Secondary | ICD-10-CM

## 2024-09-11 ENCOUNTER — Encounter: Payer: Self-pay | Admitting: Internal Medicine

## 2024-09-11 ENCOUNTER — Ambulatory Visit (INDEPENDENT_AMBULATORY_CARE_PROVIDER_SITE_OTHER): Payer: Medicare (Managed Care) | Admitting: Internal Medicine

## 2024-09-11 VITALS — BP 146/78 | HR 47 | Temp 98.5°F | Resp 16 | Ht 70.0 in | Wt 209.0 lb

## 2024-09-11 DIAGNOSIS — M7022 Olecranon bursitis, left elbow: Secondary | ICD-10-CM | POA: Diagnosis not present

## 2024-09-11 MED ORDER — METHYLPREDNISOLONE ACETATE 40 MG/ML IJ SUSP
40.0000 mg | Freq: Once | INTRAMUSCULAR | Status: AC
  Administered 2024-09-11: 40 mg via INTRAMUSCULAR

## 2024-09-11 NOTE — Progress Notes (Signed)
 Subjective:  Patient ID: Ronald Bond, male    DOB: 1946-05-15  Age: 78 y.o. MRN: [id number]  CC: Mass (On left elbow for about 3 weeks getting bigger. Patient states that at night this lump is waking the patient up at night, causing his hands and arms to feel numb. )   HPI Ronald Bond presents for f/up ----  Discussed the use of AI scribe software for clinical note transcription with the patient, who gave verbal consent to proceed.  History of Present Illness Ronald Bond is a 78 year old male who presents with a lump on his left elbow.  He has had a lump on his left elbow for the past three weeks, which appears to be enlarging. He feels pressure in the area but denies any specific injury. No fever or chills are reported, and he has not experienced similar issues on the right side.  He experiences nocturnal hand stiffness, waking up at night with his hands and fingers feeling stiff, requiring movement to improve circulation. He occasionally experiences muscle cramps and has been taking leftover potassium supplements by breaking them in half until he can discuss it further.  He recently visited an ENT for a hearing test about three weeks ago, but he has not been informed if he needs a hearing aid.     Outpatient Medications Prior to Visit  Medication Sig Dispense Refill   ACCRUFER  30 MG CAPS SMARTSIG:1 Capsule(s) By Mouth Morning-Night     albuterol  (VENTOLIN  HFA) 108 (90 Base) MCG/ACT inhaler INHALE TWO PUFFS BY MOUTH EVERY 6 HOURS AS NEEDED FOR WHEEZING OR FOR SHORTNESS OF BREATH 6.7 g 3   amLODipine  (NORVASC ) 10 MG tablet TAKE 1 TABLET BY MOUTH EVERY DAY 90 tablet 0   amoxicillin (AMOXIL) 500 MG capsule Take 2,000 mg by mouth once.     atorvastatin  (LIPITOR) 40 MG tablet Take 1 tablet (40 mg total) by mouth daily. 90 tablet 1   Blood Pressure Monitoring (BLOOD PRESSURE MONITOR/ARM) DEVI 1 Device by Does not apply route daily. USE DAILY TO  TAKE BLOOD PRESSURE READINGS 1  each 0   cetirizine (ZYRTEC) 10 MG tablet Take 10 mg by mouth daily.     cholecalciferol  (VITAMIN D3) 25 MCG (1000 UNIT) tablet Take 1,000 Units by mouth daily.     docusate sodium  (COLACE) 100 MG capsule Take 100 mg by mouth 2 (two) times daily.     dorzolamide -timolol  (COSOPT ) 2-0.5 % ophthalmic solution Instill 1 drop into both eyes twice a day 10 mL 6   finasteride  (PROSCAR ) 5 MG tablet Take 1 tablet (5 mg total) by mouth daily. 90 tablet 3   Finerenone  (KERENDIA ) 20 MG TABS TAKE ONE TABLET (20 MG TOTAL) BY MOUTH DAILY AT 9AM 30 tablet 11   fluticasone  (FLONASE ) 50 MCG/ACT nasal spray Place 2 sprays into both nostrils daily. (Patient taking differently: Place 2 sprays into both nostrils daily as needed for allergies.) 48 g 1   hydrALAZINE  (APRESOLINE ) 25 MG tablet TAKE ONE TABLET BY MOUTH THREE TIMES DAILY AT 9AM, 1PM AND 5PM 270 tablet 1   hydrALAZINE  (APRESOLINE ) 50 MG tablet TAKE 1 TABLET BY MOUTH THREE TIMES A DAY 270 tablet 0   JARDIANCE 25 MG TABS tablet TAKE 1 TABLET (25 MG TOTAL) BY MOUTH DAILY. 90 tablet 0   latanoprost  (XALATAN ) 0.005 % ophthalmic solution Place 1 drop into both eyes every night at bedtime. 2.5 mL 6   levothyroxine  (SYNTHROID ) 125 MCG tablet Take 1  tablet (125 mcg total) by mouth daily. 90 tablet 3   metoprolol  tartrate (LOPRESSOR ) 25 MG tablet TAKE 1/2 TABLET BY MOUTH TWICE DAILY @ 9AM & 5PM 90 tablet 3   Multiple Vitamins-Minerals (ONE-A-DAY 50 PLUS PO) Take by mouth daily.     omeprazole  (PRILOSEC) 40 MG capsule TAKE ONE CAPSULE (40 MG TOTAL) BY MOUTH DAILY AT 9AM 90 capsule 2   Polyethyl Glycol-Propyl Glycol 0.4-0.3 % SOLN Place 1 drop into both eyes as needed (dry eyes).     tamsulosin  (FLOMAX ) 0.4 MG CAPS capsule TAKE ONE TABLET BY MOUTH DAILY AT 9AM AFTER BREAKFAST 30 capsule 11   thiamine  (VITAMIN B-1) 50 MG tablet TAKE ONE TABLET BY MOUTH DAILY AT 9AM 30 tablet 11   TURMERIC CURCUMIN PO Take 1 tablet by mouth daily.     valsartan  (DIOVAN ) 320 MG tablet Take  1 tablet (320 mg total) by mouth daily. 90 tablet 1   levofloxacin (LEVAQUIN) 500 MG tablet Take 500 mg by mouth daily.     No facility-administered medications prior to visit.    ROS Review of Systems  Constitutional:  Negative for appetite change, chills, diaphoresis, fatigue and fever.  HENT: Negative.    Eyes: Negative.   Respiratory:  Negative for chest tightness, shortness of breath and wheezing.   Cardiovascular:  Negative for chest pain, palpitations and leg swelling.  Gastrointestinal: Negative.  Negative for abdominal pain, constipation, diarrhea, nausea and vomiting.  Endocrine: Negative.   Genitourinary: Negative.  Negative for difficulty urinating.  Musculoskeletal:  Positive for joint swelling.  Skin: Negative.   Neurological:  Negative for dizziness.  Hematological:  Negative for adenopathy. Does not bruise/bleed easily.  Psychiatric/Behavioral: Negative.      Objective:  BP (!) 146/78 (BP Location: Left Arm, Patient Position: Sitting, Cuff Size: Normal)   Pulse (!) 47   Temp 98.5 F (36.9 C) (Oral)   Resp 16   Ht 5' 10 (1.778 m)   Wt 209 lb (94.8 kg)   SpO2 99%   BMI 29.99 kg/m   BP Readings from Last 3 Encounters:  09/13/24 (!) 144/78  09/11/24 (!) 146/78  08/16/24 (!) 158/66    Wt Readings from Last 3 Encounters:  09/13/24 208 lb 12.8 oz (94.7 kg)  09/11/24 209 lb (94.8 kg)  06/04/24 210 lb (95.3 kg)    Physical Exam Vitals reviewed.  Constitutional:      Appearance: Normal appearance.  Eyes:     General: No scleral icterus.    Conjunctiva/sclera: Conjunctivae normal.  Cardiovascular:     Rate and Rhythm: Regular rhythm. Bradycardia present.     Heart sounds: No murmur heard.    No friction rub. No gallop.  Pulmonary:     Effort: Pulmonary effort is normal.     Breath sounds: No stridor. No wheezing, rhonchi or rales.  Abdominal:     General: Abdomen is flat.     Palpations: There is no mass.     Tenderness: There is no abdominal  tenderness. There is no guarding.     Hernia: No hernia is present.  Musculoskeletal:        General: Normal range of motion.     Right elbow: Normal.     Left elbow: Swelling and effusion present. No deformity or lacerations.     Cervical back: Neck supple.     Right lower leg: No edema.     Left lower leg: No edema.     Comments: Effusion over the left olecranon  bursa The overlying skin is normal  Lymphadenopathy:     Cervical: No cervical adenopathy.  Skin:    General: Skin is warm.  Neurological:     General: No focal deficit present.     Mental Status: He is alert.     Lab Results  Component Value Date   WBC 7.8 09/13/2024   HGB 12.0 (L) 09/13/2024   HCT 36.2 (L) 09/13/2024   PLT 295.0 09/13/2024   GLUCOSE 96 09/13/2024   CHOL 149 04/24/2024   TRIG 100.0 04/24/2024   HDL 59.30 04/24/2024   LDLDIRECT 75.0 03/08/2016   LDLCALC 70 04/24/2024   ALT 11 04/24/2024   AST 13 04/24/2024   NA 139 09/13/2024   K 4.0 09/13/2024   CL 104 09/13/2024   CREATININE 1.84 (H) 09/13/2024   BUN 23 09/13/2024   CO2 27 09/13/2024   TSH 0.42 09/13/2024   PSA 3.58 12/28/2022   INR 1.0 08/17/2023   HGBA1C 6.1 09/13/2024   MICROALBUR 6.8 (H) 09/13/2024    MR BRAIN WO CONTRAST Result Date: 08/18/2023 CLINICAL DATA:  Dizziness, balance issues, high blood pressure; stroke suspected EXAM: MRI HEAD WITHOUT CONTRAST TECHNIQUE: Multiplanar, multiecho pulse sequences of the brain and surrounding structures were obtained without intravenous contrast. COMPARISON:  No prior MRI available, correlation is made with 08/17/2023 CT head FINDINGS: Brain: No restricted diffusion to suggest acute or subacute infarct. No acute hemorrhage, mass, mass effect, or midline shift. No hydrocephalus or extra-axial collection. Normal pituitary and craniocervical junction. No hemosiderin deposition to suggest remote hemorrhage. Scattered T2 hyperintense signal in the periventricular white matter, likely the sequela of  mild chronic small vessel ischemic disease. Dilated perivascular spaces in the basal ganglia and cortex. Normal cerebral volume for age. Vascular: Normal arterial flow voids. Skull and upper cervical spine: Normal marrow signal. Sinuses/Orbits: Clear paranasal sinuses. As on the prior CT, there is enlargement of the right inferior rectus muscle, and to a lesser extent, the right medial, superior, lateral rectus muscles (series 21, image 26). Right proptosis. No acute finding in the left orbit. Other: The mastoid air cells are well aerated. IMPRESSION: 1. No acute intracranial process. No evidence of acute or subacute infarct. 2. 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 Electronically Signed   By: Donald Campion M.D.   On: 08/18/2023 01:20   CT HEAD WO CONTRAST Result Date: 08/17/2023 CLINICAL DATA:  Neuro deficit, acute, stroke suspected EXAM: CT HEAD WITHOUT CONTRAST TECHNIQUE: Contiguous axial images were obtained from the base of the skull through the vertex without intravenous contrast. RADIATION DOSE REDUCTION: This exam was performed according to the departmental dose-optimization program which includes automated exposure control, adjustment of the mA and/or kV according to patient size and/or use of iterative reconstruction technique. COMPARISON:  None Available. FINDINGS: Brain: No hemorrhage. No hydrocephalus. No extra-axial fluid collection. Possible age indeterminate infarct in the right thalamus. No mass effect. No mass lesion. Vascular: No hyperdense vessel or unexpected calcification. Skull: Normal. Negative for fracture or focal lesion. Sinuses/Orbits: No middle ear or mastoid effusion. Paranasal sinuses are clear. Bilateral lens replacement. There is asymmetric enlargement of the right inferior rectus muscle body (series 4, image  58). There is also mild right-sided proptosis. Other: None. IMPRESSION: 1. Possible age indeterminate infarct in the right thalamus. If there is clinical concern for an acute infarct, consider  MRI for further evaluation. 2. Asymmetric enlargement of the right inferior rectus muscle body with mild right-sided proptosis. 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 Recommend correlation with ophthalmologic exam and thyroid  labs. Electronically Signed   By: Lyndall Gore M.D.   On: 08/17/2023 19:46   After informed verbal consent was obtained. Using Betadine  for cleansing and 2% Lidocaine  with epinephrine  for anesthesia (2 cc's used), with sterile technique an 18 gauge needle was used to enter the bursa and 15 cc's of serosanguinous fluid was collected.  40 mg of depo-medrol  was injected into the bursa. Hemostasis was obtained by pressure and a dressing.. The specimens were labeled and sent. The procedure was well tolerated without complications.   Assessment & Plan:    Olecranon bursitis of left elbow- The fluid is negative for infection or crystals. This is c/w traumatic bursitis. -     WOUND CULTURE; Future -     Synovial Fluid Analysis, Complete; Future -     methylPREDNISolone  Acetate  Other orders -     TIQ-NTM     Follow-up: Return in about 2 days (around 09/13/2024).  Debby Molt, MD

## 2024-09-11 NOTE — Patient Instructions (Signed)
Elbow Bursitis  Elbow (olecranon) bursitis is the swelling of the fluid-filled sac (bursa) between the tip of your elbow and your skin. A bursa acts as a cushion and protects the joint. If the bursa becomes irritated, it can fill with extra fluid and become inflamed. This condition is also called olecranon bursitis. What are the causes? This condition may be caused by: An elbow injury, such as falling onto the elbow. Leaning on hard surfaces for long periods of time. Infection from an injury that breaks the skin near the elbow. A bone growth (spur) that forms at the tip of the elbow. A medical condition that causes inflammation, such as gout or rheumatoid arthritis. Sometimes the cause is not known. What are the signs or symptoms? The first sign of elbow bursitis is usually swelling at the tip of the elbow. This can grow to be about the size of a golf ball. Swelling may start suddenly or develop gradually. Other symptoms may include: Pain when bending or leaning on the elbow. Stiffness, or not being able to move the elbow normally. If bursitis is caused by an infection, you may have: Redness, warmth, and tenderness of the elbow. Drainage of pus from the swollen area over the elbow, if the skin breaks open. How is this diagnosed? This condition may be diagnosed based on: Your symptoms and medical history. Any recent injuries you have had. A physical exam. X-rays to check for a bone spur or fracture. Draining fluid from the bursa to test it for infection. Blood tests to rule out gout or rheumatoid arthritis. How is this treated? Treatment for this condition depends on the cause. Treatment may include: Medicines. These may include: Over-the-counter medicines to relieve pain and inflammation. Antibiotic medicines if there is an infection. Injections of anti-inflammatory medicines (steroids). Draining fluid from the bursa. Wrapping your elbow with a bandage or compression arm  sleeve. Wearing elbow pads. If these treatments do not help, you may need surgery to remove the bursa. Follow these instructions at home: Medicines Take over-the-counter and prescription medicines only as told by your health care provider. If you were prescribed an antibiotic medicine, take it as told by your health care provider. Do not stop taking the antibiotic even if you start to feel better. Managing pain, stiffness, and swelling     If directed, put ice on the affected area. To do this: Put ice in a plastic bag. Place a towel between your skin and the bag. Leave the ice on for 20 minutes, 2-3 times a day. Remove the ice if your skin turns bright red. This is very important. If you cannot feel pain, heat, or cold, you have a greater risk of damage to the area. If directed, apply heat to the affected area as often as told by your health care provider. Use the heat source that your health care provider recommends, such as a moist heat pack or a heating pad. Place a towel between your skin and the heat source. Leave the heat on 20-30 minutes. Remove the heat if your skin turns bright red. This is especially important if you are unable to feel pain, heat, or cold. You may have a greater risk of getting burned. If your bursitis is caused by an injury, rest your elbow and wear your bandage or sleeve as told by your health care provider. Use elbow pads or elbow wraps to cushion your elbow and control swelling as needed. General instructions Avoid any activities that cause elbow pain. Ask  your health care provider what activities are safe for you. Keep all follow-up visits. This is important. Contact a health care provider if: You have a fever. Your symptoms do not get better with treatment. You have pain or swelling that gets worse, or it goes away and then comes back. You have pus draining from your elbow. You have redness around the elbow area. Your elbow is warm to the touch. Get  help right away if: You have trouble moving your arm, hand, or fingers. Summary Elbow (olecranon) bursitis is the swelling of the fluid-filled sac (bursa) between the tip of your elbow and your skin. Treatment for elbow bursitis depends on the cause. It may include medicines to relieve pain and inflammation, antibiotic medicines to treat an infection, or draining fluid from your elbow. Contact a health care provider if your symptoms do not get better with treatment, or if your symptoms go away and then come back. This information is not intended to replace advice given to you by your health care provider. Make sure you discuss any questions you have with your health care provider. Document Revised: 11/09/2021 Document Reviewed: 11/09/2021 Elsevier Patient Education  2024 [employer].

## 2024-09-13 ENCOUNTER — Encounter: Payer: Self-pay | Admitting: Internal Medicine

## 2024-09-13 ENCOUNTER — Ambulatory Visit (INDEPENDENT_AMBULATORY_CARE_PROVIDER_SITE_OTHER): Payer: Medicare (Managed Care) | Admitting: Internal Medicine

## 2024-09-13 VITALS — BP 144/78 | HR 45 | Temp 97.7°F | Resp 16 | Ht 70.0 in | Wt 208.8 lb

## 2024-09-13 DIAGNOSIS — E118 Type 2 diabetes mellitus with unspecified complications: Secondary | ICD-10-CM | POA: Diagnosis not present

## 2024-09-13 DIAGNOSIS — D539 Nutritional anemia, unspecified: Secondary | ICD-10-CM | POA: Diagnosis not present

## 2024-09-13 DIAGNOSIS — N183 Chronic kidney disease, stage 3 unspecified: Secondary | ICD-10-CM | POA: Diagnosis not present

## 2024-09-13 DIAGNOSIS — E1122 Type 2 diabetes mellitus with diabetic chronic kidney disease: Secondary | ICD-10-CM | POA: Diagnosis not present

## 2024-09-13 DIAGNOSIS — Z23 Encounter for immunization: Secondary | ICD-10-CM | POA: Insufficient documentation

## 2024-09-13 DIAGNOSIS — E519 Thiamine deficiency, unspecified: Secondary | ICD-10-CM | POA: Diagnosis not present

## 2024-09-13 DIAGNOSIS — R001 Bradycardia, unspecified: Secondary | ICD-10-CM | POA: Diagnosis not present

## 2024-09-13 DIAGNOSIS — E890 Postprocedural hypothyroidism: Secondary | ICD-10-CM

## 2024-09-13 DIAGNOSIS — I10 Essential (primary) hypertension: Secondary | ICD-10-CM

## 2024-09-13 LAB — CBC WITH DIFFERENTIAL/PLATELET
Basophils Absolute: 0.1 K/uL (ref 0.0–0.1)
Basophils Relative: 1.1 % (ref 0.0–3.0)
Eosinophils Absolute: 0.2 K/uL (ref 0.0–0.7)
Eosinophils Relative: 2.7 % (ref 0.0–5.0)
HCT: 36.2 % — ABNORMAL LOW (ref 39.0–52.0)
Hemoglobin: 12 g/dL — ABNORMAL LOW (ref 13.0–17.0)
Lymphocytes Relative: 12.1 % (ref 12.0–46.0)
Lymphs Abs: 0.9 K/uL (ref 0.7–4.0)
MCHC: 33 g/dL (ref 30.0–36.0)
MCV: 90.4 fl (ref 78.0–100.0)
Monocytes Absolute: 0.8 K/uL (ref 0.1–1.0)
Monocytes Relative: 9.9 % (ref 3.0–12.0)
Neutro Abs: 5.8 K/uL (ref 1.4–7.7)
Neutrophils Relative %: 74.2 % (ref 43.0–77.0)
Platelets: 295 K/uL (ref 150.0–400.0)
RBC: 4.01 Mil/uL — ABNORMAL LOW (ref 4.22–5.81)
RDW: 15.9 % — ABNORMAL HIGH (ref 11.5–15.5)
WBC: 7.8 K/uL (ref 4.0–10.5)

## 2024-09-13 LAB — URINALYSIS, ROUTINE W REFLEX MICROSCOPIC
Bilirubin Urine: NEGATIVE
Hgb urine dipstick: NEGATIVE
Ketones, ur: NEGATIVE
Leukocytes,Ua: NEGATIVE
Nitrite: NEGATIVE
Specific Gravity, Urine: 1.015 (ref 1.000–1.030)
Total Protein, Urine: NEGATIVE
Urine Glucose: >=1000 — AB
Urobilinogen, UA: 0.2 (ref 0.0–1.0)
pH: 6.5 (ref 5.0–8.0)

## 2024-09-13 LAB — BASIC METABOLIC PANEL WITH GFR
BUN: 23 mg/dL (ref 6–23)
CO2: 27 meq/L (ref 19–32)
Calcium: 9.1 mg/dL (ref 8.4–10.5)
Chloride: 104 meq/L (ref 96–112)
Creatinine, Ser: 1.84 mg/dL — ABNORMAL HIGH (ref 0.40–1.50)
GFR: 34.61 mL/min — ABNORMAL LOW (ref >60.00)
Glucose, Bld: 96 mg/dL (ref 70–99)
Potassium: 4 meq/L (ref 3.5–5.1)
Sodium: 139 meq/L (ref 135–145)

## 2024-09-13 LAB — IBC + FERRITIN
Ferritin: 43.3 ng/mL (ref 22.0–322.0)
Iron: 74 ug/dL (ref 42–165)
Saturation Ratios: 19.9 % — ABNORMAL LOW (ref 20.0–50.0)
TIBC: 371 ug/dL (ref 250.0–450.0)
Transferrin: 265 mg/dL (ref 212.0–360.0)

## 2024-09-13 LAB — MICROALBUMIN / CREATININE URINE RATIO
Creatinine,U: 84.9 mg/dL
Microalb Creat Ratio: 80.1 mg/g — ABNORMAL HIGH (ref 0.0–30.0)
Microalb, Ur: 6.8 mg/dL — ABNORMAL HIGH (ref 0.0–1.9)

## 2024-09-13 LAB — FOLATE: Folate: 9.4 ng/mL (ref >5.9)

## 2024-09-13 LAB — HEMOGLOBIN A1C: Hgb A1c MFr Bld: 6.1 % (ref 4.6–6.5)

## 2024-09-13 LAB — TSH: TSH: 0.42 u[IU]/mL (ref 0.35–5.50)

## 2024-09-13 LAB — VITAMIN B12: Vitamin B-12: 592 pg/mL (ref 211–911)

## 2024-09-13 MED ORDER — SHINGRIX 50 MCG/0.5ML IM SUSR: 0.5000 mL | Freq: Once | INTRAMUSCULAR | 1 refills | Status: AC

## 2024-09-13 MED ORDER — COVID-19 MRNA VAC-TRIS(PFIZER) 30 MCG/0.3ML IM SUSY: 0.3000 mL | PREFILLED_SYRINGE | Freq: Once | INTRAMUSCULAR | 0 refills | Status: AC

## 2024-09-13 NOTE — Patient Instructions (Signed)
 Bradycardia, Adult Bradycardia is a slower-than-normal heartbeat. A normal resting heart rate for an adult ranges from 60 to 100 beats per minute. With bradycardia, the resting heart rate is less than 60 beats per minute. Bradycardia can prevent enough oxygen  from reaching certain areas of your body when you are active. It can be serious if it keeps enough oxygen  from reaching your brain and other parts of your body. Bradycardia is not a problem for everyone. For some healthy adults, a slow resting heart rate is normal. What are the causes? This condition may be caused by: A problem with the heart, including: A problem with the heart's electrical system, such as a heart block. With a heart block, electrical signals between the chambers of the heart are partially or completely blocked, so they are not able to work as they should. A problem with the heart's natural pacemaker (sinus node). Heart disease. A heart attack. Heart damage. Lyme disease. A heart infection. A heart condition that is present at birth (congenital heart defect). Certain medicines that treat heart conditions. Certain conditions, such as hypothyroidism and obstructive sleep apnea. Problems with the balance of chemicals and other substances, like potassium, in the blood. Trauma. Radiation therapy. What increases the risk? You are more likely to develop this condition if you: Are age 30 or older. Have high blood pressure (hypertension), high cholesterol (hyperlipidemia), or diabetes. Drink heavily, use tobacco or nicotine products, or use drugs. What are the signs or symptoms? Symptoms of this condition include: Light-headedness. Feeling faint or fainting. Fatigue and weakness. Trouble with activity or exercise. Shortness of breath. Chest pain (angina). Drowsiness. Confusion. Dizziness. How is this diagnosed? This condition may be diagnosed based on: Your symptoms. Your medical history. A physical exam. During  the exam, your health care provider will listen to your heartbeat and check your pulse. To confirm the diagnosis, your health care provider may order tests, such as: Blood tests. An electrocardiogram (ECG). This test records the heart's electrical activity. The test can show how fast your heart is beating and whether the heartbeat is steady. A test in which you wear a portable device (event recorder or Holter monitor) to record your heart's electrical activity while you go about your day. An exercise test. How is this treated? Treatment for this condition depends on the cause of the condition and how severe your symptoms are. Treatment may involve: Treatment of the underlying condition. Changing your medicines or how much medicine you take. Having a small, battery-operated device called a pacemaker implanted under the skin. When bradycardia occurs, this device can be used to increase your heart rate and help your heart beat in a regular rhythm. Follow these instructions at home: Lifestyle Manage any health conditions that contribute to bradycardia as told by your health care provider. Follow a heart-healthy diet. A nutrition specialist (dietitian) can help educate you about healthy food options and changes. Follow an exercise program that is approved by your health care provider. Maintain a healthy weight. Try to reduce or manage your stress, such as with yoga or meditation. If you need help reducing stress, ask your health care provider. Do not use any products that contain nicotine or tobacco. These products include cigarettes, chewing tobacco, and vaping devices, such as e-cigarettes. If you need help quitting, ask your health care provider. Do not use illegal drugs. Alcohol  use If you drink alcohol : Limit how much you have to: 0-1 drink a day for women who are not pregnant. 0-2 drinks a day  for men. Know how much alcohol  is in a drink. In the [place] one drink equals one 12 oz bottle of  beer (355 mL), one 5 oz glass of wine (148 mL), or one 1 oz glass of hard liquor (44 mL). General instructions Take over-the-counter and prescription medicines only as told by your health care provider. Keep all follow-up visits. This is important. How is this prevented? In some cases, bradycardia may be prevented by: Treating underlying medical problems. Stopping behaviors or medicines that can trigger the condition. Contact a health care provider if: You feel light-headed or dizzy. You almost faint. You feel weak or are easily fatigued during physical activity. You experience confusion or have memory problems. Get help right away if: You faint. You have chest pains or an irregular heartbeat (palpitations). You have trouble breathing. These symptoms may represent a serious problem that is an emergency. Do not wait to see if the symptoms will go away. Get medical help right away. Call your local emergency services (911 in the [place] Do not drive yourself to the hospital. Summary Bradycardia is a slower-than-normal heartbeat. With bradycardia, the resting heart rate is less than 60 beats per minute. Treatment for this condition depends on the cause. Manage any health conditions that contribute to bradycardia as told by your health care provider. Do not use any products that contain nicotine or tobacco. These products include cigarettes, chewing tobacco, and vaping devices, such as e-cigarettes. Keep all follow-up visits. This is important. This information is not intended to replace advice given to you by your health care provider. Make sure you discuss any questions you have with your health care provider. Document Revised: 03/07/2021 Document Reviewed: 03/07/2021 Elsevier Patient Education  2024 [employer].

## 2024-09-13 NOTE — Progress Notes (Signed)
 "  Subjective:  Patient ID: Ronald Bond, male    DOB: [birth year]  Age: 78 y.o. MRN: [id number]  CC: Hypertension   HPI Ronald Bond presents for f/up ---    Discussed the use of AI scribe software for clinical note transcription with the patient, who gave verbal consent to proceed.  History of Present Illness Ronald Bond is a 78 year old male who presents with dizziness and excessive eating.  He experiences dizziness and lightheadedness, which sometimes causes him to walk sideways. No recent syncope or passing out. His last EKG was in May, and he has not experienced any blood pressure-related symptoms such as headaches or blurred vision.  He has been experiencing excessive thirst and urination recently, along with an increased appetite over the last couple of weeks, stating 'I just eat all the time. Can't stop.' He has not noticed any weight gain or swelling in his legs or feet. He has not checked his blood sugar recently but notes that it has always been 'on the border.'  He received a flu shot recently.   Outpatient Medications Prior to Visit  Medication Sig Dispense Refill   ACCRUFER  30 MG CAPS SMARTSIG:1 Capsule(s) By Mouth Morning-Night     albuterol  (VENTOLIN  HFA) 108 (90 Base) MCG/ACT inhaler INHALE TWO PUFFS BY MOUTH EVERY 6 HOURS AS NEEDED FOR WHEEZING OR FOR SHORTNESS OF BREATH 6.7 g 3   amLODipine  (NORVASC ) 10 MG tablet TAKE 1 TABLET BY MOUTH EVERY DAY 90 tablet 0   amoxicillin (AMOXIL) 500 MG capsule Take 2,000 mg by mouth once.     atorvastatin  (LIPITOR) 40 MG tablet Take 1 tablet (40 mg total) by mouth daily. 90 tablet 1   Blood Pressure Monitoring (BLOOD PRESSURE MONITOR/ARM) DEVI 1 Device by Does not apply route daily. USE DAILY TO  TAKE BLOOD PRESSURE READINGS 1 each 0   cetirizine (ZYRTEC) 10 MG tablet Take 10 mg by mouth daily.     cholecalciferol  (VITAMIN D3) 25 MCG (1000 UNIT) tablet Take 1,000 Units by mouth daily.     docusate sodium  (COLACE) 100 MG  capsule Take 100 mg by mouth 2 (two) times daily.     dorzolamide -timolol  (COSOPT ) 2-0.5 % ophthalmic solution Instill 1 drop into both eyes twice a day 10 mL 6   finasteride  (PROSCAR ) 5 MG tablet Take 1 tablet (5 mg total) by mouth daily. 90 tablet 3   fluticasone  (FLONASE ) 50 MCG/ACT nasal spray Place 2 sprays into both nostrils daily. (Patient taking differently: Place 2 sprays into both nostrils daily as needed for allergies.) 48 g 1   hydrALAZINE  (APRESOLINE ) 25 MG tablet TAKE ONE TABLET BY MOUTH THREE TIMES DAILY AT 9AM, 1PM AND 5PM 270 tablet 1   hydrALAZINE  (APRESOLINE ) 50 MG tablet TAKE 1 TABLET BY MOUTH THREE TIMES A DAY 270 tablet 0   latanoprost  (XALATAN ) 0.005 % ophthalmic solution Place 1 drop into both eyes every night at bedtime. 2.5 mL 6   levothyroxine  (SYNTHROID ) 125 MCG tablet Take 1 tablet (125 mcg total) by mouth daily. 90 tablet 3   metoprolol  tartrate (LOPRESSOR ) 25 MG tablet TAKE 1/2 TABLET BY MOUTH TWICE DAILY @ 9AM & 5PM 90 tablet 3   mirabegron ER (MYRBETRIQ) 25 MG TB24 tablet Take 25 mg by mouth daily.     Multiple Vitamins-Minerals (ONE-A-DAY 50 PLUS PO) Take by mouth daily.     omeprazole  (PRILOSEC) 40 MG capsule TAKE ONE CAPSULE (40 MG TOTAL) BY MOUTH DAILY AT 9AM 90 capsule  2   Polyethyl Glycol-Propyl Glycol 0.4-0.3 % SOLN Place 1 drop into both eyes as needed (dry eyes).     tamsulosin  (FLOMAX ) 0.4 MG CAPS capsule TAKE ONE TABLET BY MOUTH DAILY AT 9AM AFTER BREAKFAST 30 capsule 11   thiamine  (VITAMIN B-1) 50 MG tablet TAKE ONE TABLET BY MOUTH DAILY AT 9AM 30 tablet 11   TURMERIC CURCUMIN PO Take 1 tablet by mouth daily.     valsartan  (DIOVAN ) 320 MG tablet Take 1 tablet (320 mg total) by mouth daily. 90 tablet 1   Finerenone  (KERENDIA ) 20 MG TABS TAKE ONE TABLET (20 MG TOTAL) BY MOUTH DAILY AT 9AM 30 tablet 11   JARDIANCE 25 MG TABS tablet TAKE 1 TABLET (25 MG TOTAL) BY MOUTH DAILY. 90 tablet 0   levofloxacin (LEVAQUIN) 500 MG tablet Take 500 mg by mouth daily.      No facility-administered medications prior to visit.    ROS Review of Systems  Constitutional:  Negative for appetite change, chills, diaphoresis, fatigue and fever.  HENT: Negative.    Eyes: Negative.   Respiratory: Negative.  Negative for cough, chest tightness, shortness of breath and wheezing.   Cardiovascular:  Negative for chest pain, palpitations and leg swelling.  Gastrointestinal:  Negative for abdominal pain, constipation, diarrhea, nausea and vomiting.  Endocrine: Positive for polydipsia and polyuria. Negative for polyphagia.  Genitourinary: Negative.  Negative for difficulty urinating, dysuria, flank pain and hematuria.  Musculoskeletal: Negative.  Negative for arthralgias, joint swelling and myalgias.  Skin: Negative.   Neurological:  Positive for dizziness and light-headedness. Negative for speech difficulty and weakness.  Hematological:  Negative for adenopathy. Does not bruise/bleed easily.  Psychiatric/Behavioral: Negative.      Objective:  BP (!) 144/78 (BP Location: Left Arm, Patient Position: Sitting, Cuff Size: Normal)   Pulse (!) 45   Temp 97.7 F (36.5 C) (Oral)   Resp 16   Ht 5' 10 (1.778 m)   Wt 208 lb 12.8 oz (94.7 kg)   SpO2 99%   BMI 29.96 kg/m   BP Readings from Last 3 Encounters:  09/13/24 (!) 144/78  09/11/24 (!) 146/78  08/16/24 (!) 158/66    Wt Readings from Last 3 Encounters:  09/13/24 208 lb 12.8 oz (94.7 kg)  09/11/24 209 lb (94.8 kg)  06/04/24 210 lb (95.3 kg)    Physical Exam Vitals reviewed.  Constitutional:      Appearance: Normal appearance.  HENT:     Nose: Nose normal.     Mouth/Throat:     Mouth: Mucous membranes are moist.  Eyes:     General: No scleral icterus.    Conjunctiva/sclera: Conjunctivae normal.  Cardiovascular:     Rate and Rhythm: Regular rhythm. Bradycardia present.     Heart sounds: Normal heart sounds, S1 normal and S2 normal. No murmur heard.    Comments: EKG---- Marked SB, 42 bpm LAD No LVH  or Q waves Unchanged  Pulmonary:     Effort: Pulmonary effort is normal.     Breath sounds: No stridor. No wheezing, rhonchi or rales.  Abdominal:     General: Abdomen is flat.     Palpations: There is no mass.     Tenderness: There is no abdominal tenderness. There is no guarding.     Hernia: No hernia is present.  Musculoskeletal:     Cervical back: Neck supple.     Right lower leg: No edema.     Left lower leg: No edema.  Skin:  General: Skin is warm and dry.     Findings: No rash.  Neurological:     General: No focal deficit present.     Mental Status: He is alert.  Psychiatric:        Mood and Affect: Mood normal.        Behavior: Behavior normal.     Lab Results  Component Value Date   WBC 7.8 09/13/2024   HGB 12.0 (L) 09/13/2024   HCT 36.2 (L) 09/13/2024   PLT 295.0 09/13/2024   GLUCOSE 96 09/13/2024   CHOL 149 04/24/2024   TRIG 100.0 04/24/2024   HDL 59.30 04/24/2024   LDLDIRECT 75.0 03/08/2016   LDLCALC 70 04/24/2024   ALT 11 04/24/2024   AST 13 04/24/2024   NA 139 09/13/2024   K 4.0 09/13/2024   CL 104 09/13/2024   CREATININE 1.84 (H) 09/13/2024   BUN 23 09/13/2024   CO2 27 09/13/2024   TSH 0.42 09/13/2024   PSA 3.58 12/28/2022   INR 1.0 08/17/2023   HGBA1C 6.1 09/13/2024   MICROALBUR 6.8 (H) 09/13/2024    MR BRAIN WO CONTRAST Result Date: 08/18/2023 CLINICAL DATA:  Dizziness, balance issues, high blood pressure; stroke suspected EXAM: MRI HEAD WITHOUT CONTRAST TECHNIQUE: Multiplanar, multiecho pulse sequences of the brain and surrounding structures were obtained without intravenous contrast. COMPARISON:  No prior MRI available, correlation is made with 08/17/2023 CT head FINDINGS: Brain: No restricted diffusion to suggest acute or subacute infarct. No acute hemorrhage, mass, mass effect, or midline shift. No hydrocephalus or extra-axial collection. Normal pituitary and craniocervical junction. No hemosiderin deposition to suggest remote hemorrhage.  Scattered T2 hyperintense signal in the periventricular white matter, likely the sequela of mild chronic small vessel ischemic disease. Dilated perivascular spaces in the basal ganglia and cortex. Normal cerebral volume for age. Vascular: Normal arterial flow voids. Skull and upper cervical spine: Normal marrow signal. Sinuses/Orbits: Clear paranasal sinuses. As on the prior CT, there is enlargement of the right inferior rectus muscle, and to a lesser extent, the right medial, superior, lateral rectus muscles (series 21, image 26). Right proptosis. No acute finding in the left orbit. Other: The mastoid air cells are well aerated. IMPRESSION: 1. No acute intracranial process. No evidence of acute or subacute infarct. 2. 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 Electronically Signed   By: Donald Campion M.D.   On: 08/18/2023 01:20   CT HEAD WO CONTRAST Result Date: 08/17/2023 CLINICAL DATA:  Neuro deficit, acute, stroke suspected EXAM: CT HEAD WITHOUT CONTRAST TECHNIQUE: Contiguous axial images were obtained from the base of the skull through the vertex without intravenous contrast. RADIATION DOSE REDUCTION: This exam was performed according to the departmental dose-optimization program which includes automated exposure control, adjustment of the mA and/or kV according to patient size and/or use of iterative reconstruction technique. COMPARISON:  None Available. FINDINGS: Brain: No hemorrhage. No hydrocephalus. No extra-axial fluid collection. Possible age indeterminate infarct in the right thalamus. No mass effect. No mass lesion. Vascular: No hyperdense vessel or unexpected calcification. Skull: Normal. Negative for fracture or focal lesion. Sinuses/Orbits: No middle ear or mastoid effusion. Paranasal sinuses are clear. Bilateral lens replacement.  There is asymmetric enlargement of the right inferior rectus muscle body (series 4, image 58). There is also mild right-sided proptosis. Other: None. IMPRESSION: 1. Possible age indeterminate infarct in the  right thalamus. If there is clinical concern for an acute infarct, consider MRI for further evaluation. 2. Asymmetric enlargement of the right inferior rectus muscle body with mild right-sided proptosis. 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 Recommend correlation with ophthalmologic exam and thyroid  labs. Electronically Signed   By: Lyndall Gore M.D.   On: 08/17/2023 19:46    Assessment & Plan:   Postablative hypothyroidism- He is euthyroid. -     TSH; Future  Type II diabetes mellitus with manifestations (HCC)- Blood sugar is well controlled. -     Basic metabolic panel with GFR; Future -     Urinalysis, Routine w reflex microscopic; Future -     Hemoglobin A1c; Future -     Microalbumin / creatinine urine ratio; Future -     COVID-19 mRNA Vac-TriS(Pfizer); Inject 0.3 mLs into the muscle once for 1 dose.  Dispense: 0.3 mL; Refill: 0  CKD stage 3 due to type 2 diabetes mellitus (HCC)- Will avoid nephrotoxic agents  -     Basic metabolic panel with GFR; Future -     Urinalysis, Routine w reflex microscopic; Future -     Microalbumin / creatinine urine ratio; Future  Thiamine  deficiency -     CBC with Differential/Platelet; Future  Essential hypertension, benign -     Urinalysis, Routine w reflex microscopic; Future -     EKG 12-Lead  Deficiency anemia -     Reticulocytes; Future -     IBC + Ferritin; Future -     Vitamin B12; Future -     Folate; Future -     Zinc ; Future  Symptomatic bradycardia -     Ambulatory referral to Cardiology  Need for prophylactic vaccination and inoculation against varicella -     Shingrix ; Inject 0.5 mLs into the muscle once for 1 dose.  Dispense: 0.5 mL; Refill: 1     Follow-up: Return in about 3 months (around  12/14/2024).  Debby Molt, MD "

## 2024-09-14 ENCOUNTER — Other Ambulatory Visit: Payer: Self-pay | Admitting: Internal Medicine

## 2024-09-14 DIAGNOSIS — E118 Type 2 diabetes mellitus with unspecified complications: Secondary | ICD-10-CM

## 2024-09-14 DIAGNOSIS — N1832 Chronic kidney disease, stage 3b: Secondary | ICD-10-CM

## 2024-09-15 ENCOUNTER — Ambulatory Visit: Payer: Self-pay | Admitting: Internal Medicine

## 2024-09-15 LAB — SYNOVIAL FLUID ANALYSIS, COMPLETE
Basophils, %: 0 %
Eosinophils-Synovial: 0 % (ref 0–2)
Lymphocytes-Synovial Fld: 38 % (ref 0–74)
Monocyte/Macrophage: 35 % (ref 0–69)
Neutrophil, Synovial: 27 % — ABNORMAL HIGH (ref 0–24)
Synoviocytes, %: 0 % (ref 0–15)
WBC, Synovial: 174 {cells}/uL — ABNORMAL HIGH (ref <150)

## 2024-09-15 LAB — WOUND CULTURE: RESULT:: NO GROWTH

## 2024-09-15 LAB — TIQ-NTM

## 2024-09-16 ENCOUNTER — Other Ambulatory Visit: Payer: Self-pay | Admitting: Internal Medicine

## 2024-09-16 ENCOUNTER — Telehealth: Payer: Self-pay | Admitting: Internal Medicine

## 2024-09-16 DIAGNOSIS — E1129 Type 2 diabetes mellitus with other diabetic kidney complication: Secondary | ICD-10-CM

## 2024-09-16 DIAGNOSIS — N1832 Chronic kidney disease, stage 3b: Secondary | ICD-10-CM

## 2024-09-16 DIAGNOSIS — E1122 Type 2 diabetes mellitus with diabetic chronic kidney disease: Secondary | ICD-10-CM

## 2024-09-16 NOTE — Telephone Encounter (Unsigned)
 Copied from CRM [id number]. Topic: Clinical - Medication Refill >> Sep 16, 2024 11:19 AM Tanazia G wrote: Medication: JARDIANCE 25 MG TABS tablet  Has the patient contacted their pharmacy? Yes (Agent: If no, request that the patient contact the pharmacy for the refill. If patient does not wish to contact the pharmacy document the reason why and proceed with request.) (Agent: If yes, when and what did the pharmacy advise?)  This is the patient's preferred pharmacy:  [id number] - [place], [place] - [address], [address] AT [facility] [address], [address] [place] [address] Phone: [phone] Fax: [phone]  Is this the correct pharmacy for this prescription? Yes If no, delete pharmacy and type the correct one.   Has the prescription been filled recently? Yes  Is the patient out of the medication? Yes  Has the patient been seen for an appointment in the last year OR does the patient have an upcoming appointment? Yes  Can we respond through MyChart? Yes  Agent: Please be advised that Rx refills may take up to 3 business days. We ask that you follow-up with your pharmacy.

## 2024-09-17 LAB — ZINC: Zinc: 74 ug/dL (ref 60–130)

## 2024-09-17 LAB — RETICULOCYTES
ABS Retic: 51870 {cells}/uL (ref 25000–90000)
Retic Ct Pct: 1.3 %

## 2024-09-17 NOTE — Telephone Encounter (Signed)
 Medication has already been refilled.

## 2024-09-24 NOTE — Progress Notes (Unsigned)
 Cardiology Clinic Note   Patient Name: Ronald Bond Date of Encounter: 09/25/2024  Primary Care Provider:  Joshua Debby CROME, MD Primary Cardiologist:  Alm Clay, MD  Patient Profile    Ronald Bond 78 year old male presents to the clinic today for follow-up evaluation of his essential hypertension and PACs.  Past Medical History    Past Medical History:  Diagnosis Date   Allergy    Anemia    Asthma    Cataract    Chronic kidney disease    Diabetes mellitus without complication (HCC)    GERD (gastroesophageal reflux disease)    Glaucoma    HTN (hypertension)    Hyperlipidemia    Hypothyroidism    Osteoarthritis    Pneumonia    Pre-diabetes    Sleep apnea    no CPAP   Wears glasses    Past Surgical History:  Procedure Laterality Date   COLONOSCOPY     EYE SURGERY  2006   both cataracts   JOINT REPLACEMENT     MICROLARYNGOSCOPY Left 11/18/2013   Procedure: MICROLARYNGOSCOPY WITH REMOVAL OF GRANULOMA LEFT SIDE;  Surgeon: Ida Loader, MD;  Location: [facility];  Service: ENT;  Laterality: Left;   NM MYOVIEW  LTD  06/2007   Low risk, normal.  No evidence of ischemia or infarction   THYROIDECTOMY  1974   TONSILLECTOMY     as a child   TOTAL KNEE ARTHROPLASTY  2009   left   TOTAL KNEE ARTHROPLASTY Right 02/27/2023   Procedure: TOTAL KNEE ARTHROPLASTY;  Surgeon: Edna Toribio LABOR, MD;  Location: [facility];  Service: Orthopedics;  Laterality: Right;   TOTAL KNEE REVISION Left 07/06/2022   Procedure: TOTAL KNEE REVISION;  Surgeon: Edna Toribio LABOR, MD;  Location: [facility];  Service: Orthopedics;  Laterality: Left;    Allergies  Allergies  Allergen Reactions   Lisinopril Other (See Comments)    Edema in face    History of Present Illness    Ronald Bond has a PMH of HTN, PACs, allergic rhinitis, intermittent asthma, GERD, OSA, Graves' disease, type 2 diabetes, Graves' orbitopathy, chronic renal failure stage III, iron  deficiency anemia, obesity, and bradycardia.  He was seen on 09/17/2021 by Comer Satterfield, DNP for evaluation of chest discomfort.  He has been advised to go to the emergency department 1014 for chest pain but did not go.  He had recently increased his carvedilol  from 6.25 mg twice daily in conjunction with amlodipine  and losartan .  His PCP discontinued carvedilol  due to bradycardia.  He was noted to have sinus bradycardia despite having stopped carvedilol .  A cardiac event monitor was ordered.  He was able to climb stairs and walk around.  He was at the [facility] station and was able to ambulate without difficulty shortness of breath and dizziness.  His BP was well controlled.  His cardiac event monitor showed predominantly sinus rhythm with a heart rate of 36-137 on average of 66 bpm.  He was noted to have 28 episodes of PAT and atrial runs, occasional PACs and isolated rare PVCs.  He was seen in follow-up by Dr. Clay on 12/27/2021.  He felt much better since stopping his beta-blocker.  His energy level had improved.  He was very active at his job.  He did note some shortness of breath when going upstairs but did not notice any dyspnea with normal activities.  He denies chest pain or pressure.  He denied significant palpitations.  He denied  lightheadedness or dizziness.  He did note occasional episodes of squeezing this pinpoint pressure in the area of his right pec muscle.  This was made worse with specific movements.  He presented to the clinic 09/06/22 for follow-up evaluation and stated he felt well . He was recovering well from his left knee surgery.  We reviewed his chest discomfort.  He reported that he had occasional episodes of chest discomfort that lasted for around 30 minutes at a time.  His episodes were nonexertional.  His blood pressure was 136/62.  He reported higher blood pressures at home.  I have asked him to bring his blood pressure cuff when he returns in clinic.  He had also gained  about 3 pounds since May.  He reported that he had been recently at a wedding and enjoyed the food there.  He reported compliance with his medications and was tolerating them well.   Follow-up in 9 to 12 months was planned.   He was seen in follow-up by Dr. Anner on 10/30/23.  During that time he reported occasional episodes of mild chest discomfort on his right side which would occur at any time and did not worsen with exertion.  He denied palpitations, lower extremity swelling, and shortness of breath.  His blood pressure continued to be elevated at home.  He was started on amlodipine  5 mg daily and follow-up was planned for 2 months.  He presented to the clinic 02/28/24 for follow-up evaluation and stated he had been doing fairly well.  He had been checking his blood pressure prior to taking his medication.  His blood pressure was noted to be 130/70.  His pulse was 55.  He was tolerating amlodipine  well.  He had been taking his hydralazine  twice daily.  He noted that when he checked his blood pressure at home prior to taking his medication it was in the 170 systolic range.  He did not check it after taking his medication.  He continued to try to cut back on his salt intake.  I  increased his amlodipine  to 7.5 mg daily and planned follow-up in 6 to 8 months.  I asked him to contact the office if his blood pressure reached the 110 systolic range.    He presents to the clinic today for follow-up evaluation and states he notices occasional episodes of mild dizziness.  He notices this more with changing of head positions.  He also notes some brief chest discomfort at night.  He denies exertional chest discomfort.  He reports that he continues to be very physically active doing yard work with activities such as raking leaves.  He reports that his blood pressure continues to be elevated at home in the mid 140s over 60s-70s.  His amlodipine  was previously increased by his PCP to 10 mg daily.  Initially in the clinic  today his blood pressure is 140/60 and on recheck is 134/70.  I will start doxazosin 2 mg daily.  I also gave him the salty 6 diet sheet.  Will plan follow-up in 6 months.  Today he denies chest pain, shortness of breath, lower extremity edema, fatigue, palpitations, melena, hematuria, hemoptysis, diaphoresis, weakness, presyncope, syncope, orthopnea, and PND.   Home Medications    Prior to Admission medications   Medication Sig Start Date End Date Taking? Authorizing Provider  albuterol  (VENTOLIN  HFA) 108 (90 Base) MCG/ACT inhaler Inhale 2 puffs into the lungs every 6 (six) hours as needed for wheezing or shortness of breath. 07/05/21  Joshua Debby CROME, MD  amLODipine  (NORVASC ) 10 MG tablet Take 1 tablet (10 mg total) by mouth daily. 06/06/22   Joshua Debby CROME, MD  apixaban  (ELIQUIS ) 2.5 MG TABS tablet Take 1 tablet (2.5 mg total) by mouth 2 (two) times daily. 07/08/22   Edna Toribio LABOR, MD  atorvastatin  (LIPITOR) 40 MG tablet TAKE 1 TABLET BY MOUTH EVERY DAY 07/02/22   Joshua Debby CROME, MD  Blood Pressure Monitoring (BLOOD PRESSURE MONITOR/ARM) DEVI 1 Device by Does not apply route daily. USE DAILY TO  TAKE BLOOD PRESSURE READINGS 12/27/21   Anner Alm ORN, MD  cetirizine (ZYRTEC) 10 MG tablet Take 10 mg by mouth daily.    [provider]  chlorproMAZINE  (THORAZINE ) 25 MG tablet Take 1 tablet (25 mg total) by mouth 4 (four) times daily as needed. 07/08/22   Joshua Debby CROME, MD  cholecalciferol  (VITAMIN D3) 25 MCG (1000 UNIT) tablet Take 1,000 Units by mouth daily.    [provider]  docusate sodium  (COLACE) 100 MG capsule Take 100 mg by mouth 2 (two) times daily.    [provider]  FARXIGA  10 MG TABS tablet TAKE 1 TABLET BY MOUTH EVERY DAY BEFORE BREAKFAST 06/09/22   Joshua Debby CROME, MD  fluticasone  (FLONASE ) 50 MCG/ACT nasal spray Place 2 sprays into both nostrils daily. Patient taking differently: Place 2 sprays into both nostrils daily as needed. 10/15/18   Joshua Debby CROME, MD  hydrALAZINE  (APRESOLINE ) 25 MG tablet TAKE 1 TABLET BY MOUTH THREE TIMES A DAY 09/01/22   Joshua Debby CROME, MD  indapamide  (LOZOL ) 1.25 MG tablet TAKE 1 TABLET BY MOUTH EVERY DAY 06/26/22   Joshua Debby CROME, MD  KLOR-CON  M20 20 MEQ tablet TAKE 1 TABLET BY MOUTH TWICE A DAY 08/12/22   Joshua Debby CROME, MD  levothyroxine  (SYNTHROID ) 112 MCG tablet Take 1 tablet (112 mcg total) by mouth daily. 08/31/22   Shamleffer, Ibtehal Jaralla, MD  meloxicam  (MOBIC ) 7.5 MG tablet TAKE 1 TABLET BY MOUTH EVERY DAY 08/20/22   Joshua Debby CROME, MD  metoprolol  tartrate (LOPRESSOR ) 25 MG tablet Take 0.5 tablets (12.5 mg total) by mouth 2 (two) times daily. 12/29/21 07/06/22  Jerilynn Lamarr HERO, NP  Multiple Vitamin (MULTIVITAMIN) tablet Take 1 tablet by mouth daily.    [provider]  Multiple Vitamins-Minerals (ZINC PO) Take 1 tablet by mouth daily.    [provider]  omeprazole  (PRILOSEC) 40 MG capsule TAKE 1 CAPSULE (40 MG TOTAL) BY MOUTH DAILY. 07/02/22   Joshua Debby CROME, MD  Polyethyl Glycol-Propyl Glycol 0.4-0.3 % SOLN Place 1 drop into both eyes as needed (dry eyes).    [provider]  tamsulosin  (FLOMAX ) 0.4 MG CAPS capsule TAKE 1 CAPSULE BY MOUTH EVERY DAY AFTER SUPPER 06/25/22   Joshua Debby CROME, MD  TURMERIC CURCUMIN PO Take 1 tablet by mouth daily.    [provider]  valsartan  (DIOVAN ) 320 MG tablet Take 1 tablet (320 mg total) by mouth daily. 12/27/21   Anner Alm ORN, MD  vitamin C (ASCORBIC ACID ) 500 MG tablet Take 500 mg by mouth daily.    [provider]    Family History    Family History  Problem Relation Age of Onset   Brain cancer Mother    Heart disease Father    Heart disease Brother    He indicated that his mother is deceased. He indicated that his father is deceased. He indicated that his brother is deceased. He indicated that his maternal  grandmother is deceased. He indicated that his maternal grandfather is deceased. He indicated that his  paternal grandmother is deceased. He indicated that his paternal grandfather is deceased.  Social History    Social History   Socioeconomic History   Marital status: Married    Spouse name: Actor   Number of children: 2   Years of education: Not on file   Highest education level: Not on file  Occupational History   Occupation: railroad   Occupation: RETIRED  Tobacco Use   Smoking status: Never   Smokeless tobacco: Never  Vaping Use   Vaping status: Never Used  Substance and Sexual Activity   Alcohol  use: Not Currently    Comment: occassionally   Drug use: No   Sexual activity: Not Currently  Other Topics Concern   Not on file  Social History Narrative   Regular Exercise -  YES      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  He states that he enjoys  the job a lot does a lot of walking and climbing stairs without significant discomfort.      He usually works ~2 weeks @ a time - stays in [employer] (paid for by [employer]) - & comes home for ~4 d weekends.      2 daughter and 2 grand kids and wife lives with pt/2025   Social Drivers of Health   Financial Resource Strain: Low Risk  (05/03/2024)   Overall Financial Resource Strain (CARDIA)    Difficulty of Paying Living Expenses: Not hard at all  Food Insecurity: No Food Insecurity (05/03/2024)   Hunger Vital Sign    Worried About Running Out of Food in the Last Year: Never true    Ran Out of Food in the Last Year: Never true  Transportation Needs: No Transportation Needs (05/03/2024)   PRAPARE - Administrator, Civil Service (Medical): No    Lack of Transportation (Non-Medical): No  Physical Activity: Inactive (05/03/2024)   Exercise Vital Sign    Days of Exercise per Week: 0 days    Minutes of Exercise per Session: 0 min  Stress: No Stress Concern Present (05/03/2024)    [employer] of Occupational Health - Occupational Stress Questionnaire    Feeling of Stress : Not at all  Social Connections: Socially Integrated (05/03/2024)   Social Connection and Isolation Panel    Frequency of Communication with Friends and Family: More than three times a week    Frequency of Social Gatherings with Friends and Family: Twice a week    Attends Religious Services: More than 4 times per year    Active Member of [employer] or Organizations: Yes    Attends Banker Meetings: Never    Marital Status: Married  Catering Manager Violence: Not At Risk (05/03/2024)   Humiliation, Afraid, Rape, and Kick questionnaire    Fear of Current or Ex-Partner: No    Emotionally Abused: No    Physically Abused: No    Sexually Abused: No     Review of Systems    General:  No chills, fever, night sweats or weight changes.  Cardiovascular:  No chest pain, dyspnea on exertion, edema, orthopnea, palpitations, paroxysmal nocturnal dyspnea. Dermatological: No rash, lesions/masses Respiratory: No cough, dyspnea Urologic: No hematuria, dysuria Abdominal:   No nausea, vomiting, diarrhea, bright  red blood per rectum, melena, or hematemesis Neurologic:  No visual changes, wkns, changes in mental status. All other systems reviewed and are otherwise negative except as noted above.  Physical Exam    VS:  BP 134/70   Pulse (!) 56   Ht 5' 10 (1.778 m)   Wt 213 lb 9.6 oz (96.9 kg)   SpO2 96%   BMI 30.65 kg/m  , BMI Body mass index is 30.65 kg/m. GEN: Well nourished, well developed, in no acute distress. HEENT: normal. Neck: Supple, no JVD, carotid bruits, or masses. Cardiac: RRR, no murmurs, rubs, or gallops. No clubbing, cyanosis, edema.  Radials/DP/PT 2+ and equal bilaterally.  Respiratory:  Respirations regular and unlabored, clear to auscultation bilaterally. GI: Soft, nontender, nondistended, BS + x 4. MS: no deformity or atrophy. Skin: warm and dry, no rash. Neuro:   Strength and sensation are intact. Psych: Normal affect.  Accessory Clinical Findings    Recent Labs: 04/24/2024: ALT 11 09/13/2024: BUN 23; Creatinine, Ser 1.84; Hemoglobin 12.0; Platelets 295.0; Potassium 4.0; Sodium 139; TSH 0.42   Recent Lipid Panel    Component Value Date/Time   CHOL 149 04/24/2024 0846   CHOL 284 (H) 04/17/2015 1230   TRIG 100.0 04/24/2024 0846   TRIG 166 (H) 04/17/2015 1230   HDL 59.30 04/24/2024 0846   HDL 58 04/17/2015 1230   CHOLHDL 3 04/24/2024 0846   VLDL 20.0 04/24/2024 0846   LDLCALC 70 04/24/2024 0846   LDLCALC 193 (H) 04/17/2015 1230   LDLDIRECT 75.0 03/08/2016 1624         ECG personally reviewed by me today-none today.  Cardiac event monitor 10/07/2021 7-day Predominantly sinus rhythm.:Heart rate range 36-137 bpm. Average 66 bpm. 28 episodes of PAT/atrial runs: Fastest 6 beats-102 bpm, longest 13.1 seconds 126 beats. Occasional PACs (4.9%) with rare <1%) couplets and triplets. Rare isolated PVCs <1%) No sustained arrhythmias-tachycardic or bradycardic)   No significant findings.     Alm Clay, MD  Assessment & Plan   1.  PACs-denies recent episodes of accelerated or irregular heartbeat.  He was previously noted to have 4.9% PVCs on  cardiac event monitor. Continue metoprolol  Maintain p.o. hydration,  Avoid caffeine chocolate EtOH dehydration etc.-reviewed  Bradycardia-heart rate today 56 bpm previously reported bradycardia and fatigue with beta blocking medication.    Today he denies lightheadedness, presyncope or syncope.  Denies fatigue.   Continue metoprolol  Continue to monitor  Hypertension-BP today initially 140/60 and on recheck 134/70.  Tolerating amlodipine  well.  Does note elevated blood pressure at home in the 140s over 60s/70s Previously did not tolerate carvedilol  due to bradycardia.  Also noted fatigue.  Cardiac event monitor noted occasional PACs and isolated rare PVCs with 20 episodes of PAT. has been checking  blood pressure at home prior to taking medication.  Notices blood pressures in the 170 systolic range over 80 prior to medication.  Does note some head fullness. Maintain blood pressure log Continue valsartan , Lopressor , hydralazine , amlodipine  Heart healthy low-sodium diet-salty 6 reviewed Encouraged increased physical activity Start doxazosin 2 mg daily  Hyperlipidemia-LDL 84 on 06/06/22. 04/24/2024: Cholesterol 149; HDL 59.30; LDL Cholesterol 70; Triglycerides 100.0; VLDL 20.0 Continue atorvastatin  Heart healthy low-sodium high-fiber diet Maintain physical activity   Obesity-weight today 213 lbs.  Working on increasing physical activity. Continue weight loss Calorie restricted diet Continue increase physical activity    Disposition: Follow-up with Dr. Clay on the 6 months.   Josefa HERO. Larisa Lanius NP-C     09/25/2024, 2:24 PM [facility] [address] Office [phone] Fax [phone]  Notice: This dictation was prepared with Dragon dictation along with smaller phrase technology. Any transcriptional errors that result from this process are unintentional and may not be corrected upon review.  I spent 13  minutes examining this patient, reviewing medications, and using patient centered shared decision making involving her cardiac care.   I spent greater than 20 minutes reviewing her past medical history,  medications, and prior cardiac tests.

## 2024-09-25 ENCOUNTER — Ambulatory Visit: Payer: Medicare (Managed Care) | Attending: General Practice | Admitting: General Practice

## 2024-09-25 ENCOUNTER — Encounter: Payer: Self-pay | Admitting: General Practice

## 2024-09-25 VITALS — BP 134/70 | HR 56 | Ht 70.0 in | Wt 213.6 lb

## 2024-09-25 DIAGNOSIS — E66811 Obesity, class 1: Secondary | ICD-10-CM

## 2024-09-25 DIAGNOSIS — R001 Bradycardia, unspecified: Secondary | ICD-10-CM

## 2024-09-25 DIAGNOSIS — E785 Hyperlipidemia, unspecified: Secondary | ICD-10-CM

## 2024-09-25 DIAGNOSIS — I491 Atrial premature depolarization: Secondary | ICD-10-CM | POA: Diagnosis not present

## 2024-09-25 DIAGNOSIS — E1169 Type 2 diabetes mellitus with other specified complication: Secondary | ICD-10-CM

## 2024-09-25 DIAGNOSIS — I119 Hypertensive heart disease without heart failure: Secondary | ICD-10-CM | POA: Diagnosis not present

## 2024-09-25 MED ORDER — DOXAZOSIN MESYLATE 2 MG PO TABS: 2.0000 mg | ORAL_TABLET | Freq: Every day | ORAL | 0 refills | Status: DC

## 2024-09-25 NOTE — Patient Instructions (Signed)
 Medication Instructions:  Start Doxazosin (Cardura)  2mg  daily  *If you need a refill on your cardiac medications before your next appointment, please call your pharmacy*  Lab Work: None If you have labs (blood work) drawn today and your tests are completely normal, you will receive your results only by: MyChart Message (if you have MyChart) OR A paper copy in the mail If you have any lab test that is abnormal or we need to change your treatment, we will call you to review the results.  Testing/Procedures: None  Follow-Up: At [facility], you and your health needs are our priority.  As part of our continuing mission to provide you with exceptional heart care, our providers are all part of one team.  This team includes your primary Cardiologist (physician) and Advanced Practice Providers or APPs (Physician Assistants and Nurse Practitioners) who all work together to provide you with the care you need, when you need it.  Your next appointment:   6 month(s)  Provider:   Josefa Beauvais, NP        We recommend signing up for the patient portal called MyChart.  Sign up information is provided on this After Visit Summary.  MyChart is used to connect with patients for Virtual Visits (Telemedicine).  Patients are able to view lab/test results, encounter notes, upcoming appointments, etc.  Non-urgent messages can be sent to your provider as well.   To learn more about what you can do with MyChart, go to [email or web address].   Other Instructions

## 2024-10-08 ENCOUNTER — Other Ambulatory Visit: Payer: Self-pay

## 2024-10-08 ENCOUNTER — Other Ambulatory Visit (HOSPITAL_BASED_OUTPATIENT_CLINIC_OR_DEPARTMENT_OTHER): Payer: Self-pay

## 2024-10-08 MED ORDER — BRIMONIDINE TARTRATE 0.2 % OP SOLN
1.0000 [drp] | Freq: Two times a day (BID) | OPHTHALMIC | 4 refills | Status: AC
  Filled 2024-10-08: qty 10, 50d supply, fill #0
  Filled 2024-11-21: qty 10, 50d supply, fill #1

## 2024-10-08 MED ORDER — LATANOPROST 0.005 % OP SOLN
1.0000 [drp] | Freq: Every day | OPHTHALMIC | 3 refills | Status: DC
  Filled 2024-10-08 – 2024-11-21 (×2): qty 7.5, 150d supply, fill #0

## 2024-10-08 MED ORDER — DORZOLAMIDE HCL-TIMOLOL MAL 2-0.5 % OP SOLN
1.0000 [drp] | Freq: Two times a day (BID) | OPHTHALMIC | 3 refills | Status: DC
  Filled 2024-10-08: qty 30, 90d supply, fill #0
  Filled 2024-10-21 – 2024-11-21 (×2): qty 30, 300d supply, fill #0

## 2024-10-21 ENCOUNTER — Encounter: Payer: Self-pay | Admitting: Family Medicine

## 2024-10-21 ENCOUNTER — Ambulatory Visit: Payer: Medicare (Managed Care) | Admitting: Family Medicine

## 2024-10-21 ENCOUNTER — Other Ambulatory Visit (HOSPITAL_BASED_OUTPATIENT_CLINIC_OR_DEPARTMENT_OTHER): Payer: Self-pay

## 2024-10-21 VITALS — BP 148/78 | HR 65 | Temp 98.7°F | Ht 70.0 in | Wt 212.8 lb

## 2024-10-21 DIAGNOSIS — M25422 Effusion, left elbow: Secondary | ICD-10-CM

## 2024-10-21 DIAGNOSIS — M7022 Olecranon bursitis, left elbow: Secondary | ICD-10-CM | POA: Diagnosis not present

## 2024-10-21 NOTE — Progress Notes (Signed)
 Acute Office Visit  Subjective:     Patient ID: Ronald Bond, male    DOB: [birth year], 78 y.o.   MRN: [id number]  No chief complaint on file.   HPI  Discussed the use of AI scribe software for clinical note transcription with the patient, who gave verbal consent to proceed.  History of Present Illness Ronald Bond is a 78 year old male who presents with recurrent elbow swelling.  Elbow swelling - Recurrent swelling of the elbow, previously drained one month ago - Swelling is non-painful but disrupts sleep when lying on the affected side - Steroid injection administered during previous drainage - Fluid analysis from prior drainage indicated non-infectious etiology - No use of compression or elbow sleeve for management     ROS Per HPI      Objective:    BP (!) 148/78 (BP Location: Left Arm, Patient Position: Sitting)   Pulse 65   Temp 98.7 F (37.1 C) (Temporal)   Ht 5' 10 (1.778 m)   Wt 212 lb 12.8 oz (96.5 kg)   SpO2 98%   BMI 30.53 kg/m    Physical Exam Vitals and nursing note reviewed.  Constitutional:      General: He is not in acute distress.    Appearance: Normal appearance.  HENT:     Head: Normocephalic and atraumatic.     Right Ear: External ear normal.     Left Ear: External ear normal.     Nose: Nose normal.     Mouth/Throat:     Mouth: Mucous membranes are moist.     Pharynx: Oropharynx is clear.  Eyes:     Extraocular Movements: Extraocular movements intact.  Cardiovascular:     Rate and Rhythm: Normal rate and regular rhythm.  Pulmonary:     Effort: Pulmonary effort is normal.  Musculoskeletal:        General: Swelling present.     Cervical back: Normal range of motion.     Right lower leg: No edema.     Left lower leg: No edema.     Comments: L elbow swollen, non tender, soft, no lesions, no discharge or bleeding  Lymphadenopathy:     Cervical: No cervical adenopathy.  Skin:    General: Skin is warm and dry.   Neurological:     General: No focal deficit present.     Mental Status: He is alert and oriented to person, place, and time.  Psychiatric:        Mood and Affect: Mood normal.        Behavior: Behavior normal.   Joint Injection/Arthrocentesis  Date/Time: 10/28/2024 10:20 AM  Performed by: Alvia Corean CROME, FNP Authorized by: Alvia Corean CROME, FNP  Indications: joint swelling  Body area: elbow Joint: left elbow Local anesthesia used: no  Anesthesia: Local anesthesia used: no  Sedation: Patient sedated: no  Preparation: Patient was prepped and draped in the usual sterile fashion. Needle size: 22 G Ultrasound guidance: no Approach: posterior Aspirate: serous and blood-tinged Aspirate amount: 15 mL Patient tolerance: patient tolerated the procedure well with no immediate complications Comments: No steroid given today since he has had steroid to the area in the last 30 days Compression wrap applied      No results found for any visits on 10/21/24.      Assessment & Plan:   Assessment and Plan Assessment & Plan Olecranon bursitis, left elbow Recurrent bursitis, previously drained and treated with steroids. Asymptomatic except for nocturnal  discomfort. No infection indicated by culture. - Drained right elbow bursa. - Recommended elbow sleeve or compression to prevent fluid accumulation.     Orders Placed This Encounter  Procedures   Joint Injection/Arthrocentesis    This order was created via procedure documentation     No orders of the defined types were placed in this encounter.   Return if symptoms worsen or fail to improve.  Corean LITTIE Ku, FNP

## 2024-10-28 ENCOUNTER — Encounter: Payer: Self-pay | Admitting: Family Medicine

## 2024-10-28 DIAGNOSIS — M7022 Olecranon bursitis, left elbow: Secondary | ICD-10-CM | POA: Diagnosis not present

## 2024-10-28 DIAGNOSIS — M25422 Effusion, left elbow: Secondary | ICD-10-CM

## 2024-10-28 NOTE — Patient Instructions (Signed)
 We have drained about 15 mL of fluid from your elbow today.  We were not able to inject a steroid since you have had 1 recently in the area.  We placed a compression wrap over the area, may keep this compression on or get your own compression sleeve to help prevent recurrences.  Follow-up with me for new or worsening symptoms.

## 2024-11-05 ENCOUNTER — Telehealth: Payer: Self-pay

## 2024-11-05 NOTE — Telephone Encounter (Signed)
 Copied from CRM #[id number]. Topic: General - Other >> Nov 05, 2024 10:31 AM Rosina BIRCH wrote: Reason for CRM: patient called wanting to know where he can get a CT scan done at because he is going to [facility] in [place] [phone]

## 2024-11-08 ENCOUNTER — Other Ambulatory Visit: Payer: Self-pay

## 2024-11-08 ENCOUNTER — Telehealth: Payer: Self-pay

## 2024-11-08 DIAGNOSIS — H05831 Thyroid orbitopathy, right orbit: Secondary | ICD-10-CM

## 2024-11-08 NOTE — Telephone Encounter (Signed)
 Unable to reach patient [facility] to discuss further

## 2024-11-08 NOTE — Telephone Encounter (Signed)
 Copied from CRM #[id number]. Topic: General - Other >> Nov 05, 2024 10:31 AM Rosina BIRCH wrote: Reason for CRM: patient called wanting to know where he can get a CT scan done at because he is going to [facility] in [place] [phone] >> Nov 08, 2024 10:01 AM Macario HERO wrote: Patient called requesting a follow up on his CT scan request because his appointment is January 9th. Patient is requesting a call back once we have a status.

## 2024-11-08 NOTE — Telephone Encounter (Signed)
 Reached out to the patient. Was unsuccessful. Waiting for a returned call. Will close this encounter as it is a duplicate.

## 2024-11-12 ENCOUNTER — Ambulatory Visit: Payer: Medicare (Managed Care) | Admitting: Family Medicine

## 2024-11-12 ENCOUNTER — Ambulatory Visit: Payer: Medicare (Managed Care) | Admitting: Emergency Medicine

## 2024-11-13 ENCOUNTER — Inpatient Hospital Stay: Admission: RE | Admit: 2024-11-13 | Discharge: 2024-11-13 | Payer: Medicare (Managed Care)

## 2024-11-13 DIAGNOSIS — H05831 Thyroid orbitopathy, right orbit: Secondary | ICD-10-CM

## 2024-11-13 NOTE — Telephone Encounter (Signed)
Appointment has been scheduled for tomorrow.

## 2024-11-14 ENCOUNTER — Encounter: Payer: Self-pay | Admitting: Family Medicine

## 2024-11-14 ENCOUNTER — Ambulatory Visit (INDEPENDENT_AMBULATORY_CARE_PROVIDER_SITE_OTHER): Payer: Medicare (Managed Care) | Admitting: Family Medicine

## 2024-11-14 VITALS — BP 134/72 | HR 50 | Temp 98.3°F | Ht 70.0 in | Wt 218.2 lb

## 2024-11-14 DIAGNOSIS — M7022 Olecranon bursitis, left elbow: Secondary | ICD-10-CM

## 2024-11-14 DIAGNOSIS — H05839 Thyroid orbitopathy, unspecified orbit: Secondary | ICD-10-CM | POA: Diagnosis not present

## 2024-11-14 DIAGNOSIS — Z01818 Encounter for other preprocedural examination: Secondary | ICD-10-CM | POA: Diagnosis not present

## 2024-11-14 DIAGNOSIS — E0500 Thyrotoxicosis with diffuse goiter without thyrotoxic crisis or storm: Secondary | ICD-10-CM

## 2024-11-14 DIAGNOSIS — R001 Bradycardia, unspecified: Secondary | ICD-10-CM

## 2024-11-14 LAB — CBC WITH DIFFERENTIAL/PLATELET
Basophils Absolute: 0.1 K/uL (ref 0.0–0.1)
Basophils Relative: 1 % (ref 0.0–3.0)
Eosinophils Absolute: 0.3 K/uL (ref 0.0–0.7)
Eosinophils Relative: 4.2 % (ref 0.0–5.0)
HCT: 37 % — ABNORMAL LOW (ref 39.0–52.0)
Hemoglobin: 12.3 g/dL — ABNORMAL LOW (ref 13.0–17.0)
Lymphocytes Relative: 14.5 % (ref 12.0–46.0)
Lymphs Abs: 0.9 K/uL (ref 0.7–4.0)
MCHC: 33.2 g/dL (ref 30.0–36.0)
MCV: 89.6 fl (ref 78.0–100.0)
Monocytes Absolute: 0.7 K/uL (ref 0.1–1.0)
Monocytes Relative: 11.6 % (ref 3.0–12.0)
Neutro Abs: 4.2 K/uL (ref 1.4–7.7)
Neutrophils Relative %: 68.7 % (ref 43.0–77.0)
Platelets: 262 K/uL (ref 150.0–400.0)
RBC: 4.13 Mil/uL — ABNORMAL LOW (ref 4.22–5.81)
RDW: 16 % — ABNORMAL HIGH (ref 11.5–15.5)
WBC: 6.2 K/uL (ref 4.0–10.5)

## 2024-11-14 LAB — COMPREHENSIVE METABOLIC PANEL WITH GFR
ALT: 16 U/L (ref 3–53)
AST: 14 U/L (ref 5–37)
Albumin: 4.6 g/dL (ref 3.5–5.2)
Alkaline Phosphatase: 69 U/L (ref 39–117)
BUN: 17 mg/dL (ref 6–23)
CO2: 24 meq/L (ref 19–32)
Calcium: 9.3 mg/dL (ref 8.4–10.5)
Chloride: 105 meq/L (ref 96–112)
Creatinine, Ser: 1.59 mg/dL — ABNORMAL HIGH (ref 0.40–1.50)
GFR: 41.19 mL/min — ABNORMAL LOW (ref >60.00)
Glucose, Bld: 95 mg/dL (ref 70–99)
Potassium: 4 meq/L (ref 3.5–5.1)
Sodium: 138 meq/L (ref 135–145)
Total Bilirubin: 0.4 mg/dL (ref 0.2–1.2)
Total Protein: 7.9 g/dL (ref 6.0–8.3)

## 2024-11-14 NOTE — Progress Notes (Signed)
 "  AI Surgery Clearance  Subjective:     Patient ID: Ronald Bond, male    DOB: 10/10/1946, 78 y.o.   MRN: [id number]  No chief complaint on file.   HPI  Discussed the use of AI scribe software for clinical note transcription with the patient, who gave verbal consent to proceed.  History of Present Illness He is a 78 year old male who presents for pre-operative evaluation for orbital surgery.  Pre-operative assessment for orbital surgery - Scheduled for orbital surgery in January to reposition a protruding eye - Recent EKG satisfactory for surgery - Evaluated by cardiologist last month, but has not seen them for pre op clearance. - Laboratory studies completed in October - Will be having this surgery in [place]  Olecranon bursitis - Recurrent lesion on the elbow previously drained - Lesion has recurred but is currently painless  Medical status update - No new allergies - No changes in medical status since last visit a few weeks ago     preoperative clearance for orbital repositiion  1) High Risk Cardiac Conditions:  1) Recent MI - No.  2) Decompensated Heart Failure - No.  3) Unstable angina - No.  4) Symptomatic arrythmia - No.  5) Sx Valvular Disease - No.  2) Intermediate Risk Factors: DM, CKD, CVA, CHF, CAD - No.  2) Functional Status: > 4 mets (Walk, run, climb stairs) No..   3) Surgery Specific Risk:           Intermediate (Carotid, Head and Neck, Orthopaedic )        4) Further Noninvasive evaluation:   1) EKG - Yes.     Hx of MI, CVA, CAD, DM, CKD  2) Echo - No.   Worsening dyspnea or CHF without an echo in the past year  3) Stress Testing - Active Cardiac Disease - No.  4) CXR No.   If asymptomatic, healthy, no respiratory symptoms it is not needed  5)PFTs No.   OSA, OHS, or significant cardiopulmonary history  5) Need for medical therapy - Beta Blocker, Statins indicated ? Yes.    ROS Per HPI      Objective:    BP 134/72 (BP  Location: Left Arm, Patient Position: Sitting)   Pulse (!) 50   Temp 98.3 F (36.8 C) (Oral)   Ht 5' 10 (1.778 m)   Wt 218 lb 3.2 oz (99 kg)   SpO2 97%   BMI 31.31 kg/m    Physical Exam Vitals and nursing note reviewed.  Constitutional:      General: He is not in acute distress.    Appearance: Normal appearance. He is obese.  HENT:     Head: Normocephalic and atraumatic.     Right Ear: External ear normal.     Left Ear: External ear normal.     Nose: Nose normal.     Mouth/Throat:     Mouth: Mucous membranes are moist.     Pharynx: Oropharynx is clear.  Eyes:     Extraocular Movements: Extraocular movements intact.  Cardiovascular:     Rate and Rhythm: Normal rate and regular rhythm.     Pulses: Normal pulses.     Heart sounds: Normal heart sounds.  Pulmonary:     Effort: Pulmonary effort is normal. No respiratory distress.     Breath sounds: Normal breath sounds. No wheezing, rhonchi or rales.  Musculoskeletal:        General: Swelling (L elbow swelling) present.  Cervical back: Normal range of motion.     Right lower leg: No edema.     Left lower leg: No edema.  Lymphadenopathy:     Cervical: No cervical adenopathy.  Skin:    General: Skin is warm and dry.  Neurological:     General: No focal deficit present.     Mental Status: He is alert and oriented to person, place, and time.  Psychiatric:        Mood and Affect: Mood normal.        Behavior: Behavior normal.    EKG: Reviewed and interpreted by me Indication: pre-op clearance Rate: 46 Interpretation: sinus brady Changes from previous: none significant  Results for orders placed or performed in visit on 11/14/24  CBC w/Diff  Result Value Ref Range   WBC 6.2 4.0 - 10.5 K/uL   RBC 4.13 (L) 4.22 - 5.81 Mil/uL   Hemoglobin 12.3 (L) 13.0 - 17.0 g/dL   HCT 62.9 (L) 60.9 - 47.9 %   MCV 89.6 78.0 - 100.0 fl   MCHC 33.2 30.0 - 36.0 g/dL   RDW 83.9 (H) 88.4 - 84.4 %   Platelets 262.0 150.0 - 400.0 K/uL    Neutrophils Relative % 68.7 43.0 - 77.0 %   Lymphocytes Relative 14.5 12.0 - 46.0 %   Monocytes Relative 11.6 3.0 - 12.0 %   Eosinophils Relative 4.2 0.0 - 5.0 %   Basophils Relative 1.0 0.0 - 3.0 %   Neutro Abs 4.2 1.4 - 7.7 K/uL   Lymphs Abs 0.9 0.7 - 4.0 K/uL   Monocytes Absolute 0.7 0.1 - 1.0 K/uL   Eosinophils Absolute 0.3 0.0 - 0.7 K/uL   Basophils Absolute 0.1 0.0 - 0.1 K/uL  Comprehensive metabolic panel with GFR  Result Value Ref Range   Sodium 138 135 - 145 mEq/L   Potassium 4.0 3.5 - 5.1 mEq/L   Chloride 105 96 - 112 mEq/L   CO2 24 19 - 32 mEq/L   Glucose, Bld 95 70 - 99 mg/dL   BUN 17 6 - 23 mg/dL   Creatinine, Ser 8.40 (H) 0.40 - 1.50 mg/dL   Total Bilirubin 0.4 0.2 - 1.2 mg/dL   Alkaline Phosphatase 69 39 - 117 U/L   AST 14 5 - 37 U/L   ALT 16 3 - 53 U/L   Total Protein 7.9 6.0 - 8.3 g/dL   Albumin 4.6 3.5 - 5.2 g/dL   GFR 58.80 (L) >39.99 mL/min   Calcium  9.3 8.4 - 10.5 mg/dL     BP Readings from Last 3 Encounters:  11/14/24 134/72  10/21/24 (!) 148/78  09/25/24 134/70   Wt Readings from Last 3 Encounters:  11/14/24 218 lb 3.2 oz (99 kg)  10/21/24 212 lb 12.8 oz (96.5 kg)  09/25/24 213 lb 9.6 oz (96.9 kg)      Last CBC Lab Results  Component Value Date   WBC 6.2 11/14/2024   HGB 12.3 (L) 11/14/2024   HCT 37.0 (L) 11/14/2024   MCV 89.6 11/14/2024   MCH 29.3 08/17/2023   RDW 16.0 (H) 11/14/2024   PLT 262.0 11/14/2024   Last metabolic panel Lab Results  Component Value Date   GLUCOSE 95 11/14/2024   NA 138 11/14/2024   K 4.0 11/14/2024   CL 105 11/14/2024   CO2 24 11/14/2024   BUN 17 11/14/2024   CREATININE 1.59 (H) 11/14/2024   GFR 41.19 (L) 11/14/2024   CALCIUM  9.3 11/14/2024   PROT 7.9 11/14/2024  ALBUMIN 4.6 11/14/2024   BILITOT 0.4 11/14/2024   ALKPHOS 69 11/14/2024   AST 14 11/14/2024   ALT 16 11/14/2024   ANIONGAP 10 08/17/2023   Last lipids Lab Results  Component Value Date   CHOL 149 04/24/2024   HDL 59.30  04/24/2024   LDLCALC 70 04/24/2024   LDLDIRECT 75.0 03/08/2016   TRIG 100.0 04/24/2024   CHOLHDL 3 04/24/2024   Last hemoglobin A1c Lab Results  Component Value Date   HGBA1C 6.1 09/13/2024   Last thyroid  functions Lab Results  Component Value Date   TSH 0.42 09/13/2024   FREET4 1.8 01/16/2024   Last vitamin D  Lab Results  Component Value Date   VD25OH 34.37 10/15/2018   Last vitamin B12 and Folate Lab Results  Component Value Date   VITAMINB12 592 09/13/2024   FOLATE 9.4 09/13/2024         Assessment & Plan:   Assessment and Plan Assessment & Plan Preoperative evaluation for orbital surgery Scheduled for orbital surgery on January 12th for eye repositioning due to proptosis. EKG normal. Cardiology clearance pending; recent visit may suffice. - Ordered CBC for hemoglobin levels. - Coordinated with cardiology for clearance. - Faxed paperwork to surgical coordinator and cardiology. - Ensure preoperative requirements met before surgery.  Sinus Bradycardia - will need pre-op clearance from cardiology  - chronic, stable  Olecranon Bursitis of left elbow - chronic, stable - non tender today, will hold off on draining the area for now  Graves' orbitopathy - Preop clearance today for surgical repair of orbitopathy     I have independently evaluated patient.  Ronald Bond is a 78 y.o. male who is moderate risk for a moderate risk surgery.  There are not modifiable risk factors (smoking, etc).  Review meds: If indicated, NO ACE-I/ARB day of surgery. OK to do BB or statin day of (esp if vascular sx)  Orders Placed This Encounter  Procedures   CBC w/Diff   Comprehensive metabolic panel with GFR    Release to patient:   Immediate [1]   EKG 12-Lead     No orders of the defined types were placed in this encounter.   Return for PCP as scheduled.  Corean LITTIE Ku, FNP   "

## 2024-11-22 ENCOUNTER — Other Ambulatory Visit: Payer: Self-pay

## 2024-11-22 ENCOUNTER — Ambulatory Visit: Payer: Self-pay | Admitting: Family Medicine

## 2024-11-22 ENCOUNTER — Other Ambulatory Visit (HOSPITAL_BASED_OUTPATIENT_CLINIC_OR_DEPARTMENT_OTHER): Payer: Self-pay

## 2024-11-23 NOTE — Patient Instructions (Signed)
 Your EKG showed sinus bradycardia today.  I do think that you will need preop clearance from cardiology prior to your surgery.  We are checking labs today, will be in contact with any results that require further attention  As long as your labs look good, you are cleared medically for surgery.  We faxed your paperwork to your surgeon in [place], also to cardiology so that they can clear you as well.  Follow-up with me as needed, with PCP as scheduled

## 2024-11-25 ENCOUNTER — Telehealth: Payer: Self-pay

## 2024-11-25 NOTE — Telephone Encounter (Signed)
"  ° °  Pre-operative Risk Assessment    Patient Name: Ronald Bond  DOB: 23-Aug-1946 MRN: [id number]   Date of last office visit: 09/25/24 Josefa Beauvais, NP Date of next office visit: NONE   Request for Surgical Clearance    Procedure:  Right orbitotomy  Date of Surgery:  Clearance TBD                                Surgeon:  Dr. CHRISTELLA. Elverna Socks Group or Practice Name:  [facility] Phone number:  [phone] Fax number:  [phone]   Type of Clearance Requested:   - Medical    Type of Anesthesia:  Not Indicated   Additional requests/questions:    Signed, Lucie DELENA Ku   11/25/2024, 4:53 PM    "

## 2024-11-26 NOTE — Telephone Encounter (Signed)
" ° °  Name: Ronald Bond  DOB: Jul 08, 1946  MRN: [id number]  Primary Cardiologist: Alm Clay, MD  Chart reviewed as part of pre-operative protocol coverage. Because of KODAH MARET past medical history and time since last visit, he will require a follow-up telephone visit in order to better assess preoperative cardiovascular risk.  Pre-op covering staff: - Please schedule appointment and call patient to inform them. If patient already had an upcoming appointment within acceptable timeframe, please add pre-op clearance to the appointment notes so provider is aware. - Please contact requesting surgeon's office via preferred method (i.e, phone, fax) to inform them of need for appointment prior to surgery.  No medications indicated as needing held.   Orren LOISE Fabry, PA-C  11/26/2024, 7:51 AM   "

## 2024-11-26 NOTE — Telephone Encounter (Signed)
 1st attempt to contact pt to schedule TELE APPT. Left a voicemail to call back and get that scheduled.

## 2024-11-27 ENCOUNTER — Other Ambulatory Visit: Payer: Self-pay | Admitting: Internal Medicine

## 2024-11-27 DIAGNOSIS — E785 Hyperlipidemia, unspecified: Secondary | ICD-10-CM

## 2024-11-27 DIAGNOSIS — K2100 Gastro-esophageal reflux disease with esophagitis, without bleeding: Secondary | ICD-10-CM

## 2024-11-30 ENCOUNTER — Other Ambulatory Visit: Payer: Self-pay | Admitting: Internal Medicine

## 2024-11-30 DIAGNOSIS — I10 Essential (primary) hypertension: Secondary | ICD-10-CM

## 2024-12-02 NOTE — Telephone Encounter (Signed)
2nd attempt to reach the pt to schedule a tele pre op appt.

## 2024-12-03 NOTE — Telephone Encounter (Signed)
 I left message when he calls back to not hang up with the operator until the preop team can speak with him as we are having a hard time reaching the pt.

## 2024-12-03 NOTE — Telephone Encounter (Signed)
 Luke DASEN, sent me a secure chat earlier today that the pt called back and she scheduled his tele preop appt. Luke states she did inform the pt that the preop team will call him to review medications and obtain verbal consent  for tele appt.   I left a vm for the pt to call back to preop team.

## 2024-12-03 NOTE — Telephone Encounter (Signed)
 3rd attempt to reach the pt to scheduled tele preop appt.   Will update the requesting office.

## 2024-12-04 NOTE — Telephone Encounter (Signed)
 Left message again today for the pt that we still need to get verbal consent and me rec done for preop tele appt 12/05/24. I will send a message to pt thru MY CHART.   Pt will need to be sure he answers the phone for the tele appt.

## 2024-12-05 ENCOUNTER — Ambulatory Visit: Payer: Medicare (Managed Care) | Admitting: Internal Medicine

## 2024-12-05 ENCOUNTER — Ambulatory Visit
Payer: Medicare (Managed Care) | Attending: Student in an Organized Health Care Education/Training Program | Admitting: Nurse Practitioner

## 2024-12-05 ENCOUNTER — Encounter: Payer: Self-pay | Admitting: Internal Medicine

## 2024-12-05 ENCOUNTER — Telehealth: Payer: Self-pay

## 2024-12-05 VITALS — BP 150/70 | HR 56 | Ht 70.0 in | Wt 216.0 lb

## 2024-12-05 DIAGNOSIS — E890 Postprocedural hypothyroidism: Secondary | ICD-10-CM | POA: Diagnosis not present

## 2024-12-05 DIAGNOSIS — Z01810 Encounter for preprocedural cardiovascular examination: Secondary | ICD-10-CM

## 2024-12-05 DIAGNOSIS — H05839 Thyroid orbitopathy, unspecified orbit: Secondary | ICD-10-CM | POA: Diagnosis not present

## 2024-12-05 DIAGNOSIS — E0500 Thyrotoxicosis with diffuse goiter without thyrotoxic crisis or storm: Secondary | ICD-10-CM | POA: Diagnosis not present

## 2024-12-05 MED ORDER — LEVOTHYROXINE SODIUM 125 MCG PO TABS: 125.0000 ug | ORAL_TABLET | Freq: Every day | ORAL | 3 refills | Status: AC

## 2024-12-05 NOTE — Telephone Encounter (Signed)
 I updated the preop APP Damien Braver, NP to see the previous notes. Pt was scheduled per Luke T. Pt was informed the preop team would be calling him to review his medications as well as obtain verbal consent for the tele preop appt today. We have not been able to reach the pt bu phone or my chart, see notes.

## 2024-12-05 NOTE — Progress Notes (Signed)
 "   Virtual Visit via Telephone Note   Because of Ronald Bond co-morbid illnesses, he is at least at moderate risk for complications without adequate follow up.  This format is felt to be most appropriate for this patient at this time.  Due to technical limitations with video connection web designer), today's appointment will be conducted as an audio only telehealth visit, and Ronald Bond verbally agreed to proceed in this manner.   All issues noted in this document were discussed and addressed.  No physical exam could be performed with this format.  Evaluation Performed:  Preoperative cardiovascular risk assessment _____________   Date:  12/05/2024   Patient ID:  Ronald Bond, DOB 08/12/1946, MRN [id number] Patient Location:  Home Provider location:   Office  Primary Care Provider:  Joshua Debby CROME, MD Primary Cardiologist:  Alm Clay, MD  Chief Complaint / Patient Profile   79 y.o. y/o male with a h/o PACs, bradycardia, hypertension, type 2 diabetes, CKD stage III, Graves' disease, iron deficiency anemia, asthma, OSA, and GERD who is pending right orbitotomy with Dr. Elverna of [facility] and presents today for telephonic preoperative cardiovascular risk assessment.  History of Present Illness    Ronald Bond is a 79 y.o. male who presents via audio/video conferencing for a telehealth visit today.  Pt was last seen in cardiology clinic on 09/25/2024 by Ronald Beauvais, NP.  At that time Ronald Bond was doing well.  The patient is now pending procedure as outlined above. Since his last visit, he has done well from a cardiac standpoint.   He denies chest pain, palpitations, dyspnea, pnd, orthopnea, n, v, dizziness, syncope, edema, weight gain, or early satiety. All other systems reviewed and are otherwise negative except as noted above.   Past Medical History    Past Medical History:  Diagnosis Date   Allergy    Anemia    Asthma    Cataract     Chronic kidney disease    Diabetes mellitus without complication (HCC)    GERD (gastroesophageal reflux disease)    Glaucoma    HTN (hypertension)    Hyperlipidemia    Hypothyroidism    Osteoarthritis    Pneumonia    Pre-diabetes    Sleep apnea    no CPAP   Wears glasses    Past Surgical History:  Procedure Laterality Date   COLONOSCOPY     EYE SURGERY  2006   both cataracts   JOINT REPLACEMENT     MICROLARYNGOSCOPY Left 11/18/2013   Procedure: MICROLARYNGOSCOPY WITH REMOVAL OF GRANULOMA LEFT SIDE;  Surgeon: Ida Loader, MD;  Location: [facility];  Service: ENT;  Laterality: Left;   NM MYOVIEW  LTD  06/2007   Low risk, normal.  No evidence of ischemia or infarction   THYROIDECTOMY  1974   TONSILLECTOMY     as a child   TOTAL KNEE ARTHROPLASTY  2009   left   TOTAL KNEE ARTHROPLASTY Right 02/27/2023   Procedure: TOTAL KNEE ARTHROPLASTY;  Surgeon: Edna Toribio LABOR, MD;  Location: [facility];  Service: Orthopedics;  Laterality: Right;   TOTAL KNEE REVISION Left 07/06/2022   Procedure: TOTAL KNEE REVISION;  Surgeon: Edna Toribio LABOR, MD;  Location: [facility];  Service: Orthopedics;  Laterality: Left;    Allergies  Allergies[1]  Home Medications    Prior to Admission medications  Medication Sig Start Date End Date Taking? Authorizing Provider  ACCRUFER  30 MG CAPS SMARTSIG:1 Capsule(s) By Mouth  Morning-Night 08/18/23   [provider]  albuterol  (VENTOLIN  HFA) 108 (90 Base) MCG/ACT inhaler INHALE TWO PUFFS BY MOUTH EVERY 6 HOURS AS NEEDED FOR WHEEZING OR FOR SHORTNESS OF BREATH 09/09/24   Joshua Debby CROME, MD  amLODipine  (NORVASC ) 10 MG tablet TAKE 1 TABLET BY MOUTH EVERY DAY 12/02/24   Joshua Debby CROME, MD  atorvastatin  (LIPITOR) 40 MG tablet TAKE ONE TABLET (40MG  TOTAL) BY MOUTH DAILY AT 5PM 12/02/24   Joshua Debby CROME, MD  Blood Pressure Monitoring (BLOOD PRESSURE MONITOR/ARM) DEVI 1 Device by Does not apply route daily. USE DAILY TO  TAKE BLOOD PRESSURE  READINGS 12/27/21   Anner Alm ORN, MD  brimonidine  (ALPHAGAN ) 0.2 % ophthalmic solution Instill 1 drop into right eye twice a day 10/08/24     cetirizine (ZYRTEC) 10 MG tablet Take 10 mg by mouth daily.    [provider]  cholecalciferol  (VITAMIN D3) 25 MCG (1000 UNIT) tablet Take 1,000 Units by mouth daily.    [provider]  docusate sodium  (COLACE) 100 MG capsule Take 100 mg by mouth 2 (two) times daily.    [provider]  dorzolamide -timolol  (COSOPT ) 2-0.5 % ophthalmic solution Instill 1 drop into both eyes twice a day 01/11/24     doxazosin  (CARDURA ) 2 MG tablet Take 1 tablet (2 mg total) by mouth daily. 09/25/24   Emelia Ronald HERO, NP  finasteride  (PROSCAR ) 5 MG tablet Take 1 tablet (5 mg total) by mouth daily. 08/23/24     Finerenone  (KERENDIA ) 20 MG TABS TAKE 1 TABLET BY MOUTH EVERY DAY 09/17/24   Joshua Debby CROME, MD  fluticasone  (FLONASE ) 50 MCG/ACT nasal spray Place 2 sprays into both nostrils daily. Patient taking differently: Place 2 sprays into both nostrils daily as needed for allergies. 10/15/18   Joshua Debby CROME, MD  hydrALAZINE  (APRESOLINE ) 50 MG tablet TAKE 1 TABLET BY MOUTH THREE TIMES A DAY 07/22/24   Joshua Debby CROME, MD  JARDIANCE 25 MG TABS tablet TAKE 1 TABLET (25 MG TOTAL) BY MOUTH DAILY. 09/16/24   Joshua Debby CROME, MD  latanoprost  (XALATAN ) 0.005 % ophthalmic solution Place 1 drop into both eyes every night at bedtime. 01/11/24     levothyroxine  (SYNTHROID ) 125 MCG tablet Take 1 tablet (125 mcg total) by mouth daily. 12/05/24   Shamleffer, Ibtehal Jaralla, MD  metoprolol  tartrate (LOPRESSOR ) 25 MG tablet TAKE 1/2 TABLET BY MOUTH TWICE DAILY @ 9AM & 5PM 01/22/24   Anner Alm ORN, MD  mirabegron ER (MYRBETRIQ) 25 MG TB24 tablet Take 25 mg by mouth daily.    [provider]  Multiple Vitamins-Minerals (ONE-A-DAY 50 PLUS PO) Take by mouth daily.    [provider]  omeprazole  (PRILOSEC) 40 MG capsule TAKE ONE CAPSULE (40MG  TOTAL) BY  MOUTH DAILY AT 9AM 12/02/24   Joshua Debby CROME, MD  Polyethyl Glycol-Propyl Glycol 0.4-0.3 % SOLN Place 1 drop into both eyes as needed (dry eyes).    [provider]  tamsulosin  (FLOMAX ) 0.4 MG CAPS capsule TAKE ONE TABLET BY MOUTH DAILY AT 9AM AFTER BREAKFAST Patient taking differently: Take 0.4 mg by mouth 2 (two) times daily. 08/06/24   Joshua Debby CROME, MD  thiamine  (VITAMIN B-1) 50 MG tablet TAKE ONE TABLET BY MOUTH DAILY AT 9AM 09/02/24   Joshua Debby CROME, MD  TURMERIC CURCUMIN PO Take 1 tablet by mouth daily.    [provider]  valsartan  (DIOVAN ) 320 MG tablet TAKE ONE TABLET (320 MG TOTAL) BY MOUTH DAILY AT 9AM 12/02/24  Joshua Debby CROME, MD    Physical Exam    Vital Signs:  Ronald Bond does not have vital signs available for review today.  Given telephonic nature of communication, physical exam is limited. AAOx3. NAD. Normal affect.  Speech and respirations are unlabored.  Accessory Clinical Findings    None  Assessment & Plan    1.  Preoperative Cardiovascular Risk Assessment:  According to the Revised Cardiac Risk Index (RCRI), his Perioperative Risk of Major Cardiac Event is (%): 0.4. His Functional Capacity in METs is: 7.01 according to the Duke Activity Status Index (DASI). Therefore, based on ACC/AHA guidelines, patient would be at acceptable risk for the planned procedure without further cardiovascular testing.   The patient was advised that if he develops new symptoms prior to surgery to contact our office to arrange for a follow-up visit, and he verbalized understanding.  A copy of this note will be routed to requesting surgeon.  Time:   Today, I have spent 5 minutes with the patient with telehealth technology discussing medical history, symptoms, and management plan.     Ronald JAYSON Braver, NP  12/05/2024, 2:29 PM     [1]  Allergies Allergen Reactions   Lisinopril Other (See Comments)    Edema in face   "

## 2024-12-05 NOTE — Progress Notes (Signed)
 "   Name: Ronald Bond  MRN/ DOB: [id number], 04-13-1946    Age/ Sex: 79 y.o., male     PCP: Joshua Debby CROME, MD   Reason for Endocrinology Evaluation:  Yvone' disease     Initial Endocrinology Clinic Visit: 10/07/2019    PATIENT IDENTIFIER: [name] is a 79 y.o., male with a past medical history of Graves' disease, HTN, dyslipidemia and CHF. He has followed with [facility] since 10/07/2019 for consultative assistance with management of his Graves' disease  HISTORICAL SUMMARY: The patient was first diagnosed with hyperthyroidism 1974 , requiring total thyroidectomy with benign pathology, but a couple of years later the pt was noted with hyperthyroidism again due tissue regrowth, requiring  RAI ablation , and was on LT-4 replacement until 07/2019 when this was stopped due to a low TSH 0.12 uIU/mL   But the patient restarted his L T4 replacement weeks later due to palpitation and abnormal sensation.  Pt [place] in the [place] area for [employer] in [place] system and while in [place] he reported to [facility] due to right eye diplopia and inability to move his eye , which he describes as locked An MRI 08/02/2019 showed asymmetric enlargement right inferior rectus muscle and to a lesser degree left inferior rectus muscle , additionally b/l medial and lateral rectus muscles also demonstrate mild enlargement consistent with Grave's Orbitopathy.  He was treated with prednisone , without any improvement in his vision.  He was unable to obtain the Tepezza due to shortage per patient.  He is S/P radiation therapy x3  in [place]   In 2021    He normally sees Dr. Octavia locally and has an appointment to see him today    Mother with graves' disease    SUBJECTIVE:   Today (12/05/2024):  [name] is here for follow-up on Graves' disease.     He completed radiation therapy for Grave's orbitopathy in [place].  S/P right eye sx 05/2020 and 01/2021  Orbital CT 11/23/2023   showed no significant change from 2022, including asymmetric enlargement of the right inferior rectus muscle   The patient is scheduled for another orbital surgery on January 12 for eye repositioning due to proptosis at  [facility]  No double vision, has itching but not much  burning as well as tearing  Patient has been noted with weight gain since his last visit here No local neck symptoms No palpitations Constipation or diarrhea- he is on stool softeners  No Tremors   Levothyroxine  125 mcg daily     HISTORY:  Past Medical History:  Past Medical History:  Diagnosis Date   Allergy    Anemia    Asthma    Cataract    Chronic kidney disease    Diabetes mellitus without complication (HCC)    GERD (gastroesophageal reflux disease)    Glaucoma    HTN (hypertension)    Hyperlipidemia    Hypothyroidism    Osteoarthritis    Pneumonia    Pre-diabetes    Sleep apnea    no CPAP   Wears glasses    Past Surgical History:  Past Surgical History:  Procedure Laterality Date   COLONOSCOPY     EYE SURGERY  2006   both cataracts   JOINT REPLACEMENT     MICROLARYNGOSCOPY Left 11/18/2013   Procedure: MICROLARYNGOSCOPY WITH REMOVAL OF GRANULOMA LEFT SIDE;  Surgeon: Ida Loader, MD;  Location: [facility];  Service: ENT;  Laterality:  Left;   NM MYOVIEW  LTD  06/2007   Low risk, normal.  No evidence of ischemia or infarction   THYROIDECTOMY  1974   TONSILLECTOMY     as a child   TOTAL KNEE ARTHROPLASTY  2009   left   TOTAL KNEE ARTHROPLASTY Right 02/27/2023   Procedure: TOTAL KNEE ARTHROPLASTY;  Surgeon: Edna Toribio LABOR, MD;  Location: [facility];  Service: Orthopedics;  Laterality: Right;   TOTAL KNEE REVISION Left 07/06/2022   Procedure: TOTAL KNEE REVISION;  Surgeon: Edna Toribio LABOR, MD;  Location: [facility];  Service: Orthopedics;  Laterality: Left;   Social History:  reports that he has never smoked. He has never used smokeless tobacco. He reports  that he does not currently use alcohol . He reports that he does not use drugs. Family History:  Family History  Problem Relation Age of Onset   Brain cancer Mother    Heart disease Father    Heart disease Brother      HOME MEDICATIONS: Allergies as of 12/05/2024       Reactions   Lisinopril Other (See Comments)   Edema in face        Medication List        Accurate as of December 05, 2024  9:20 AM. If you have any questions, ask your nurse or doctor.          ACCRUFeR  30 MG Caps Generic drug: Ferric Maltol  SMARTSIG:1 Capsule(s) By Mouth Morning-Night   albuterol  108 (90 Base) MCG/ACT inhaler Commonly known as: VENTOLIN  HFA INHALE TWO PUFFS BY MOUTH EVERY 6 HOURS AS NEEDED FOR WHEEZING OR FOR SHORTNESS OF BREATH   amLODipine  10 MG tablet Commonly known as: NORVASC  TAKE 1 TABLET BY MOUTH EVERY DAY   atorvastatin  40 MG tablet Commonly known as: LIPITOR TAKE ONE TABLET (40MG  TOTAL) BY MOUTH DAILY AT 5PM   Blood Pressure Monitor/Arm Devi 1 Device by Does not apply route daily. USE DAILY TO  TAKE BLOOD PRESSURE READINGS   brimonidine  0.2 % ophthalmic solution Commonly known as: ALPHAGAN  Instill 1 drop into right eye twice a day   cetirizine 10 MG tablet Commonly known as: ZYRTEC Take 10 mg by mouth daily.   cholecalciferol  25 MCG (1000 UNIT) tablet Commonly known as: VITAMIN D3 Take 1,000 Units by mouth daily.   docusate sodium  100 MG capsule Commonly known as: COLACE Take 100 mg by mouth 2 (two) times daily.   dorzolamide -timolol  2-0.5 % ophthalmic solution Commonly known as: COSOPT  Instill 1 drop into both eyes twice a day What changed: Another medication with the same name was removed. Continue taking this medication, and follow the directions you see here. Changed by: Donell Butts, MD   doxazosin  2 MG tablet Commonly known as: Cardura  Take 1 tablet (2 mg total) by mouth daily.   finasteride  5 MG tablet Commonly known as: PROSCAR  Take 1  tablet (5 mg total) by mouth daily.   fluticasone  50 MCG/ACT nasal spray Commonly known as: FLONASE  Place 2 sprays into both nostrils daily. What changed:  when to take this reasons to take this   hydrALAZINE  50 MG tablet Commonly known as: APRESOLINE  TAKE 1 TABLET BY MOUTH THREE TIMES A DAY   Jardiance 25 MG Tabs tablet Generic drug: empagliflozin TAKE 1 TABLET (25 MG TOTAL) BY MOUTH DAILY.   Kerendia  20 MG Tabs Generic drug: Finerenone  TAKE 1 TABLET BY MOUTH EVERY DAY   latanoprost  0.005 % ophthalmic solution Commonly known as: XALATAN  Place 1 drop into both  eyes every night at bedtime. What changed: Another medication with the same name was removed. Continue taking this medication, and follow the directions you see here. Changed by: Donell Butts, MD   levothyroxine  125 MCG tablet Commonly known as: SYNTHROID  Take 1 tablet (125 mcg total) by mouth daily.   metoprolol  tartrate 25 MG tablet Commonly known as: LOPRESSOR  TAKE 1/2 TABLET BY MOUTH TWICE DAILY @ 9AM & 5PM   mirabegron ER 25 MG Tb24 tablet Commonly known as: MYRBETRIQ Take 25 mg by mouth daily.   omeprazole  40 MG capsule Commonly known as: PRILOSEC TAKE ONE CAPSULE (40MG  TOTAL) BY MOUTH DAILY AT 9AM   ONE-A-DAY 50 PLUS PO Take by mouth daily.   Polyethyl Glycol-Propyl Glycol 0.4-0.3 % Soln Place 1 drop into both eyes as needed (dry eyes).   tamsulosin  0.4 MG Caps capsule Commonly known as: FLOMAX  TAKE ONE TABLET BY MOUTH DAILY AT 9AM AFTER BREAKFAST What changed: See the new instructions.   thiamine  50 MG tablet Commonly known as: VITAMIN B-1 TAKE ONE TABLET BY MOUTH DAILY AT 9AM   TURMERIC CURCUMIN PO Take 1 tablet by mouth daily.   valsartan  320 MG tablet Commonly known as: DIOVAN  TAKE ONE TABLET (320 MG TOTAL) BY MOUTH DAILY AT 9AM          OBJECTIVE:   PHYSICAL EXAM: VS: BP (!) 150/70   Pulse (!) 56   Ht 5' 10 (1.778 m)   Wt 216 lb (98 kg)   SpO2 97%   BMI 30.99  kg/m    EXAM: General: Pt appears well and is in NAD  Eyes: External eye exam shows right eye proptosis, conjunctival injection, normal EOM.  Neck: General: Supple without adenopathy. Thyroid : No goiter or nodules appreciated.   Lungs: Clear with good BS bilat   Heart: Auscultation: RRR.  Extremities:  BL LE: No pretibial edema   Mental Status: Judgment, insight: Intact Memory: Intact for recent and remote events Mood and affect: No depression, anxiety, or agitation     DATA REVIEWED:   Latest Reference Range & Units 09/13/24 09:03  TSH 0.35 - 5.50 uIU/mL 0.42    07/2019 TSI : 474 (H)      ASSESSMENT / PLAN / RECOMMENDATIONS:   Graves' disease, status post thyroidectomy followed by radioactive iodine  ablation:  -Patient remains clinically euthyroid -No local neck symptoms -Most recent labs through his PCPs office showed normal TFTs -No changes at this time - We again discussed pathophysiology of Graves' disease and how it impacts his eyes  Medications   Continue Levothyroxine  125 MCG daily  2. Graves' Orbitopathy :    - S/P radiation therapy, prednisone  therapy and recently sx on the right eye - Diplopia resolved - Continues to follow up at [facility] , he is scheduled for right eye repositioning surgery on January 12    Follow-up in 6 months     Signed electronically by: Stefano Redgie Butts, MD  [facility] Endocrinology  [facility] [address] [place] [address] [place], [place] [address] Phone: [phone] FAX: [phone]      CC: Joshua Debby CROME, MD [address] [place] [address] Phone: [phone]  Fax: [phone]   Return to Endocrinology clinic as below: Future Appointments  Date Time Provider Department Center  12/05/2024  2:20 PM [facility] PRE OP CLEARANCE APP [facility]  05/08/2025 11:30 AM [facility]-ANNUAL WELLNESS VISIT [facility] [employer]

## 2024-12-05 NOTE — Telephone Encounter (Signed)
"  °  Patient Consent for Virtual Visit        Ronald Bond has provided verbal consent on 12/05/2024 for a virtual visit (video or telephone).   CONSENT FOR VIRTUAL VISIT FOR:  Ronald Bond  By participating in this virtual visit I agree to the following:  I hereby voluntarily request, consent and authorize [facility] and its employed or contracted physicians, physician assistants, nurse practitioners or other licensed health care professionals (the Practitioner), to provide me with telemedicine health care services (the Services) as deemed necessary by the treating Practitioner. I acknowledge and consent to receive the Services by the Practitioner via telemedicine. I understand that the telemedicine visit will involve communicating with the Practitioner through live audiovisual communication technology and the disclosure of certain medical information by electronic transmission. I acknowledge that I have been given the opportunity to request an in-person assessment or other available alternative prior to the telemedicine visit and am voluntarily participating in the telemedicine visit.  I understand that I have the right to withhold or withdraw my consent to the use of telemedicine in the course of my care at any time, without affecting my right to future care or treatment, and that the Practitioner or I may terminate the telemedicine visit at any time. I understand that I have the right to inspect all information obtained and/or recorded in the course of the telemedicine visit and may receive copies of available information for a reasonable fee.  I understand that some of the potential risks of receiving the Services via telemedicine include:  Delay or interruption in medical evaluation due to technological equipment failure or disruption; Information transmitted may not be sufficient (e.g. poor resolution of images) to allow for appropriate medical decision making by the  Practitioner; and/or  In rare instances, security protocols could fail, causing a breach of personal health information.  Furthermore, I acknowledge that it is my responsibility to provide information about my medical history, conditions and care that is complete and accurate to the best of my ability. I acknowledge that Practitioner's advice, recommendations, and/or decision may be based on factors not within their control, such as incomplete or inaccurate data provided by me or distortions of diagnostic images or specimens that may result from electronic transmissions. I understand that the practice of medicine is not an exact science and that Practitioner makes no warranties or guarantees regarding treatment outcomes. I acknowledge that a copy of this consent can be made available to me via my patient portal [facility] MyChart), or I can request a printed copy by calling the office of [facility].    I understand that my insurance will be billed for this visit.   I have read or had this consent read to me. I understand the contents of this consent, which adequately explains the benefits and risks of the Services being provided via telemedicine.  I have been provided ample opportunity to ask questions regarding this consent and the Services and have had my questions answered to my satisfaction. I give my informed consent for the services to be provided through the use of telemedicine in my medical care    "

## 2024-12-05 NOTE — Patient Instructions (Signed)

## 2024-12-06 NOTE — Telephone Encounter (Signed)
 Office is calling asking that his last in office visit notes be sent over to them. Fax # [phone]. Please advise

## 2024-12-06 NOTE — Telephone Encounter (Signed)
 Will fax notes to the fax# given today.

## 2024-12-11 ENCOUNTER — Other Ambulatory Visit: Payer: Self-pay | Admitting: Internal Medicine

## 2024-12-11 DIAGNOSIS — J4520 Mild intermittent asthma, uncomplicated: Secondary | ICD-10-CM

## 2024-12-16 ENCOUNTER — Other Ambulatory Visit: Payer: Self-pay | Admitting: Cardiology

## 2024-12-18 ENCOUNTER — Other Ambulatory Visit: Payer: Self-pay | Admitting: Internal Medicine

## 2024-12-18 ENCOUNTER — Other Ambulatory Visit: Payer: Self-pay | Admitting: General Practice

## 2024-12-18 DIAGNOSIS — E118 Type 2 diabetes mellitus with unspecified complications: Secondary | ICD-10-CM

## 2024-12-18 DIAGNOSIS — N1832 Chronic kidney disease, stage 3b: Secondary | ICD-10-CM

## 2024-12-20 ENCOUNTER — Other Ambulatory Visit: Payer: Self-pay

## 2024-12-21 ENCOUNTER — Other Ambulatory Visit: Payer: Self-pay | Admitting: Internal Medicine

## 2024-12-23 MED ORDER — METOPROLOL TARTRATE 25 MG PO TABS: ORAL_TABLET | ORAL | 3 refills | Status: AC

## 2025-01-02 ENCOUNTER — Other Ambulatory Visit: Payer: Self-pay | Admitting: Internal Medicine

## 2025-05-08 ENCOUNTER — Ambulatory Visit: Payer: Medicare (Managed Care)

## 2025-06-05 ENCOUNTER — Ambulatory Visit: Payer: Medicare (Managed Care) | Admitting: Internal Medicine
